# Patient Record
Sex: Female | Born: 1950 | Race: White | Hispanic: No | Marital: Married | Smoking: Never smoker
Health system: Southern US, Community
[De-identification: ages and names within clinical notes are randomized; demographics above are authoritative.]

## PROBLEM LIST (undated history)

## (undated) DIAGNOSIS — F329 Major depressive disorder, single episode, unspecified: Secondary | ICD-10-CM

## (undated) DIAGNOSIS — M349 Systemic sclerosis, unspecified: Secondary | ICD-10-CM

## (undated) DIAGNOSIS — F32A Depression, unspecified: Secondary | ICD-10-CM

## (undated) DIAGNOSIS — Z923 Personal history of irradiation: Secondary | ICD-10-CM

## (undated) DIAGNOSIS — M8580 Other specified disorders of bone density and structure, unspecified site: Secondary | ICD-10-CM

## (undated) DIAGNOSIS — N390 Urinary tract infection, site not specified: Secondary | ICD-10-CM

## (undated) DIAGNOSIS — E039 Hypothyroidism, unspecified: Secondary | ICD-10-CM

## (undated) DIAGNOSIS — I10 Essential (primary) hypertension: Secondary | ICD-10-CM

## (undated) DIAGNOSIS — K635 Polyp of colon: Secondary | ICD-10-CM

## (undated) DIAGNOSIS — M24549 Contracture, unspecified hand: Secondary | ICD-10-CM

## (undated) DIAGNOSIS — I7300 Raynaud's syndrome without gangrene: Secondary | ICD-10-CM

## (undated) DIAGNOSIS — R21 Rash and other nonspecific skin eruption: Secondary | ICD-10-CM

## (undated) DIAGNOSIS — N9489 Other specified conditions associated with female genital organs and menstrual cycle: Secondary | ICD-10-CM

## (undated) DIAGNOSIS — R768 Other specified abnormal immunological findings in serum: Secondary | ICD-10-CM

## (undated) DIAGNOSIS — R234 Changes in skin texture: Secondary | ICD-10-CM

## (undated) DIAGNOSIS — K219 Gastro-esophageal reflux disease without esophagitis: Secondary | ICD-10-CM

## (undated) DIAGNOSIS — M1990 Unspecified osteoarthritis, unspecified site: Secondary | ICD-10-CM

## (undated) DIAGNOSIS — K222 Esophageal obstruction: Secondary | ICD-10-CM

## (undated) DIAGNOSIS — C50419 Malignant neoplasm of upper-outer quadrant of unspecified female breast: Principal | ICD-10-CM

## (undated) DIAGNOSIS — K449 Diaphragmatic hernia without obstruction or gangrene: Secondary | ICD-10-CM

## (undated) DIAGNOSIS — D649 Anemia, unspecified: Secondary | ICD-10-CM

## (undated) DIAGNOSIS — K648 Other hemorrhoids: Secondary | ICD-10-CM

## (undated) DIAGNOSIS — E785 Hyperlipidemia, unspecified: Secondary | ICD-10-CM

## (undated) HISTORY — PX: WISDOM TOOTH EXTRACTION: SHX21

## (undated) HISTORY — DX: Esophageal obstruction: K22.2

## (undated) HISTORY — DX: Gastro-esophageal reflux disease without esophagitis: K21.9

## (undated) HISTORY — PX: ECTOPIC PREGNANCY SURGERY: SHX613

## (undated) HISTORY — DX: Unspecified osteoarthritis, unspecified site: M19.90

## (undated) HISTORY — DX: Other hemorrhoids: K64.8

## (undated) HISTORY — DX: Other specified abnormal immunological findings in serum: R76.8

## (undated) HISTORY — DX: Malignant neoplasm of upper-outer quadrant of unspecified female breast: C50.419

## (undated) HISTORY — DX: Hyperlipidemia, unspecified: E78.5

## (undated) HISTORY — DX: Polyp of colon: K63.5

## (undated) HISTORY — DX: Diaphragmatic hernia without obstruction or gangrene: K44.9

## (undated) HISTORY — PX: PARTIAL MASTECTOMY WITH AXILLARY SENTINEL LYMPH NODE BIOPSY: SHX6004

## (undated) HISTORY — PX: TRANSTHORACIC ECHOCARDIOGRAM: SHX275

## (undated) HISTORY — DX: Essential (primary) hypertension: I10

## (undated) HISTORY — DX: Urinary tract infection, site not specified: N39.0

## (undated) HISTORY — DX: Major depressive disorder, single episode, unspecified: F32.9

## (undated) HISTORY — DX: Depression, unspecified: F32.A

## (undated) HISTORY — DX: Anemia, unspecified: D64.9

---

## 1998-07-15 HISTORY — PX: BUNIONECTOMY: SHX129

## 2011-08-29 DIAGNOSIS — C50419 Malignant neoplasm of upper-outer quadrant of unspecified female breast: Secondary | ICD-10-CM

## 2011-08-29 HISTORY — DX: Malignant neoplasm of upper-outer quadrant of unspecified female breast: C50.419

## 2011-08-30 ENCOUNTER — Other Ambulatory Visit: Payer: Self-pay | Admitting: Radiology

## 2011-08-30 DIAGNOSIS — C50912 Malignant neoplasm of unspecified site of left female breast: Secondary | ICD-10-CM

## 2011-09-03 ENCOUNTER — Ambulatory Visit
Admission: RE | Admit: 2011-09-03 | Discharge: 2011-09-03 | Disposition: A | Discharge status: Home / Self Care | Payer: PRIVATE HEALTH INSURANCE | Source: Ambulatory Visit | Attending: Radiology | Admitting: Radiology

## 2011-09-03 DIAGNOSIS — C50912 Malignant neoplasm of unspecified site of left female breast: Secondary | ICD-10-CM

## 2011-09-03 MED ORDER — GADOBENATE DIMEGLUMINE 529 MG/ML IV SOLN
13.0000 mL | Freq: Once | INTRAVENOUS | Status: AC | PRN
  Administered 2011-09-03: 13 mL via INTRAVENOUS

## 2011-09-13 ENCOUNTER — Telehealth: Payer: Self-pay | Admitting: *Deleted

## 2011-09-13 ENCOUNTER — Encounter (INDEPENDENT_AMBULATORY_CARE_PROVIDER_SITE_OTHER): Payer: Self-pay | Admitting: Surgery

## 2011-09-13 ENCOUNTER — Ambulatory Visit (INDEPENDENT_AMBULATORY_CARE_PROVIDER_SITE_OTHER): Payer: No Typology Code available for payment source | Admitting: Surgery

## 2011-09-13 VITALS — BP 152/104 | HR 76 | Temp 97.4°F | Resp 16 | Ht 65.0 in | Wt 144.0 lb

## 2011-09-13 DIAGNOSIS — C50419 Malignant neoplasm of upper-outer quadrant of unspecified female breast: Secondary | ICD-10-CM

## 2011-09-13 NOTE — Telephone Encounter (Signed)
LMOVM for pt to return my call.  

## 2011-09-13 NOTE — Progress Notes (Signed)
Patient ID: [name], female   DOB: 06-25-1951, 61 y.o.   MRN: [id number]  Chief Complaint  Patient presents with  . Breast Cancer    new pt- eval L breast   Referred by: Dominga Ferry, MD PCP: Elijio Miles, MD, MD   HPI [name] is a 61 y.o. female.  She recently had a mammogram done an abnormality was found in the left breast towards the axillary tail. 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 An MRI has shown no other lesions.  The patient is not having any current breast symptoms. She has a history of breast cysts having been aspirated in the past but none for several years. She has no family history of breast cancer. She does have a maternal aunt who developed ovarian cancer in her 20s. HPI  Past Medical History  Diagnosis Date  . Cancer     L breast  . Hyperlipidemia   . Thyroid disease     hypothyroidism  . Breastr cancer, IDC, Left UOQ, clinical stage II Receptor +, Her 2 - 08/29/2011    Past Surgical History  Procedure Date  . Ectopic pregnancy surgery 1986-88  . Bunionectomy 2000    Left    Family History  Problem Relation Age of Onset  . Heart disease Mother   . Heart disease Father   . Cancer Maternal Aunt     ovarian  . Heart disease Paternal Uncle     Social History History  Substance Use Topics  . Smoking status: Never Smoker   . Smokeless tobacco: Not on file  . Alcohol Use: Yes    Allergies  Allergen Reactions  . Epinephrine     Sever headaches  . Iodine Rash  . Nickel Rash    Rash and blisters  . Penicillins Rash    Rash and blisters  . Sulfa Drugs Cross Reactors Rash    Current Outpatient Prescriptions  Medication Sig Dispense Refill  . aspirin 81 MG tablet Take 81 mg by mouth every other day.      [name] atorvastatin (LIPITOR) 10 MG tablet Take 10 mg by mouth daily.      . B Complex-C (SUPER B COMPLEX PO) Take by mouth.      [name] CALCIUM PO Take 700 mg by mouth daily.      .  Cholecalciferol (VITAMIN D-3 PO) Take 2,000 Units by mouth daily.      . Coenzyme Q10 (COQ10) 100 MG CAPS Take by mouth.      . levothyroxine (SYNTHROID, LEVOTHROID) 75 MCG tablet Take 75 mcg by mouth every other day.      . levothyroxine (SYNTHROID, LEVOTHROID) 88 MCG tablet Take 88 mcg by mouth every other day.      . Magnesium 400 MG CAPS Take by mouth daily.      . mometasone (NASONEX) 50 MCG/ACT nasal spray Place 2 sprays into the nose daily.      . Multiple Vitamin (MULTIVITAMIN) capsule Take 1 capsule by mouth daily.      . NON FORMULARY Mangosteen 3 oz QD      . vitamin E (VITAMIN E) 400 UNIT capsule Take 400 Units by mouth daily.        Review of Systems Review of Systems  Constitutional: Negative for fever, chills and unexpected weight change.  HENT: Negative for hearing loss, congestion, sore throat, trouble swallowing and voice change.   Eyes: Negative for visual disturbance.  Respiratory: Negative  for cough and wheezing.   Cardiovascular: Negative for chest pain, palpitations and leg swelling.  Gastrointestinal: Negative for nausea, vomiting, abdominal pain, diarrhea, constipation, blood in stool, abdominal distention and anal bleeding.  Genitourinary: Negative for hematuria, vaginal bleeding and difficulty urinating.  Musculoskeletal: Negative for arthralgias.  Skin: Negative for rash and wound.  Neurological: Negative for seizures, syncope and headaches.  Hematological: Negative for adenopathy. Does not bruise/bleed easily.  Psychiatric/Behavioral: Negative for confusion.    Blood pressure 152/104, pulse 76, temperature 97.4 F (36.3 C), temperature source Temporal, resp. rate 16, height 5\' 5"  (1.651 m), weight 144 lb (65.318 kg).  Physical Exam Physical Exam  Vitals reviewed. Constitutional: She is oriented to person, place, and time. She appears well-developed and well-nourished. No distress.  HENT:  Head: Normocephalic and atraumatic.  Mouth/Throat: Oropharynx is  clear and moist.  Eyes: Conjunctivae and EOM are normal. Pupils are equal, round, and reactive to light. No scleral icterus.  Neck: Normal range of motion. Neck supple. No tracheal deviation present. No thyromegaly present.  Cardiovascular: Normal rate, regular rhythm, normal heart sounds and intact distal pulses.  Exam reveals no gallop and no friction rub.   No murmur heard. Pulmonary/Chest: Effort normal and breath sounds normal. No respiratory distress. She has no wheezes. She has no rales.         3 cm hard mass in axillary tail. Mobile, but fairly close to skin.     Abdominal: Soft. Bowel sounds are normal. She exhibits no distension and no mass. There is no tenderness. There is no rebound and no guarding.  Musculoskeletal: Normal range of motion. She exhibits no edema and no tenderness.  Neurological: She is alert and oriented to person, place, and time.  Skin: Skin is warm and dry. No rash noted. She is not diaphoretic. No erythema.  Psychiatric: She has a normal mood and affect. Her behavior is normal. Judgment and thought content normal.  Lymphatics: No axillary or supraclavicular adenopathy is noted  Data Reviewed Mammoggrams and MRI reviewed with the radiologist; path reviewed with pathologist  Assessment    Clinical Stage II IDC, UOQ, Left, receptor +    Plan    I think she is a candidate for lumpectomy and SLN. The lumpectomy would be easier if the tumor were smaller with neoadjuvant chemo or hormonal. However, I think we can do it at the present size if there is a potential for no chemo.  I have explained the pathophysiology and staging of breast cancer with particular attention to her exact situation. We discussed the multidisciplinary approach to breast cancer which often includes both medical and radiation oncology consultations.  We also discussed surgical options for the treatment of breast cancer including lumpectomy and mastectomy with possible reconstructive  surgery. In addition we talked about the evaluation and management of lymph nodes including a description of sentinel lymph node biopsy and axillary dissections. We reviewed potential complications and risks including bleeding, infection, numbness,  lymphedema, and the potential need for additional surgery.  She understands that for patients who are candidate for lumpectomy or mastectomy there is an equal survival rate with either technique, but a slightly higher local recurrence rate with lumpectomy. In addition she knows that a lumpectomy usually requires postoperative radiation as part of the management of the breast cancer.  We have discussed the likely postoperative course and plans for followup.  I have given the patient some written information that reviewed all of these issues. I believe her questions are answered  and that she has a good understanding of the issues. I spend thirty minutes in counseling        [place] Alexa J 09/13/2011, 9:28 AM

## 2011-09-13 NOTE — Patient Instructions (Signed)
We will schedule surgery after we know when your appointment with Dr Darnelle Catalan is

## 2011-09-16 ENCOUNTER — Telehealth: Payer: Self-pay | Admitting: *Deleted

## 2011-09-16 NOTE — Telephone Encounter (Signed)
Confirmed 10/02/11 appt w/ pt.  Mailed before appt letter to pt.

## 2011-09-20 ENCOUNTER — Telehealth (INDEPENDENT_AMBULATORY_CARE_PROVIDER_SITE_OTHER): Payer: Self-pay

## 2011-09-20 NOTE — Telephone Encounter (Signed)
Pt calling in to notify Lesly Rubenstein she has an appt with Dr Darnelle Catalan on 10-02-11 and she is wondering what Dr Jamey Ripa was doing about scheduling her surgery. Pt didn't know if she had to wait to schedule after she  See's Dr Darnelle Catalan on 10-02-11. Please advise.

## 2011-09-23 NOTE — Telephone Encounter (Signed)
Left message for patient to call me back. He would like her to see the oncologist prior to scheduling surgery.

## 2011-09-24 NOTE — Telephone Encounter (Signed)
Called patient back, made her aware she does need to see oncology first. We will follow up with her after.

## 2011-10-01 ENCOUNTER — Other Ambulatory Visit: Payer: Self-pay | Admitting: *Deleted

## 2011-10-01 DIAGNOSIS — C50419 Malignant neoplasm of upper-outer quadrant of unspecified female breast: Secondary | ICD-10-CM

## 2011-10-02 ENCOUNTER — Ambulatory Visit: Payer: Self-pay

## 2011-10-02 ENCOUNTER — Other Ambulatory Visit (HOSPITAL_BASED_OUTPATIENT_CLINIC_OR_DEPARTMENT_OTHER): Payer: No Typology Code available for payment source | Admitting: Lab

## 2011-10-02 ENCOUNTER — Encounter: Payer: Self-pay | Admitting: Oncology

## 2011-10-02 ENCOUNTER — Ambulatory Visit (HOSPITAL_BASED_OUTPATIENT_CLINIC_OR_DEPARTMENT_OTHER): Payer: No Typology Code available for payment source | Admitting: Oncology

## 2011-10-02 VITALS — BP 155/83 | HR 80 | Temp 98.4°F | Ht 65.0 in | Wt 144.1 lb

## 2011-10-02 DIAGNOSIS — Z170 Estrogen receptor positive status [ER+]: Secondary | ICD-10-CM

## 2011-10-02 DIAGNOSIS — C50419 Malignant neoplasm of upper-outer quadrant of unspecified female breast: Secondary | ICD-10-CM

## 2011-10-02 DIAGNOSIS — C50919 Malignant neoplasm of unspecified site of unspecified female breast: Secondary | ICD-10-CM

## 2011-10-02 LAB — COMPREHENSIVE METABOLIC PANEL
AST: 21 U/L (ref 0–37)
Albumin: 4.5 g/dL (ref 3.5–5.2)
Alkaline Phosphatase: 70 U/L (ref 39–117)
BUN: 16 mg/dL (ref 6–23)
Calcium: 9.8 mg/dL (ref 8.4–10.5)
Chloride: 100 mEq/L (ref 96–112)
Glucose, Bld: 90 mg/dL (ref 70–99)
Potassium: 3.8 mEq/L (ref 3.5–5.3)
Sodium: 135 mEq/L (ref 135–145)
Total Protein: 7.6 g/dL (ref 6.0–8.3)

## 2011-10-02 LAB — CBC WITH DIFFERENTIAL/PLATELET
Basophils Absolute: 0 10*3/uL (ref 0.0–0.1)
Eosinophils Absolute: 0.1 10*3/uL (ref 0.0–0.5)
MONO#: 0.5 10*3/uL (ref 0.1–0.9)
RBC: 4.41 10*6/uL (ref 3.70–5.45)
RDW: 14 % (ref 11.2–14.5)
lymph#: 1.6 10*3/uL (ref 0.9–3.3)

## 2011-10-02 LAB — CANCER ANTIGEN 27.29: CA 27.29: 15 U/mL (ref 0–39)

## 2011-10-02 NOTE — Progress Notes (Signed)
Patient came in today as a new patient,they have [employer] insurance,I did offer her financial assistance and she said she will decline right now.

## 2011-10-02 NOTE — Progress Notes (Signed)
ID: [name]   DOB: [birth year]  MR#: [id number]  WUJ#:[id number]  HISTORY OF PRESENT ILLNESS: The patient had routine screening mammography at [facility] 12[facility] 2013, suggesting a potential abnormality in the left breast. Additional views 08/29/2011 confirmed an irregular mass with architectural or distortion measuring up to 4 cm in the left breast, without associated microcalcifications. By ultrasound this was irregular, and showed posterior shadowing. It measured 3.6 cm sonographically. There were no suspicious lymph nodes noted in the left axilla.  The same day the patient underwent ultrasound-guided biopsy of the left breast mass, showing ([id number]) and invasive ductal carcinoma, grade 1, which was 100% estrogen receptor and 97% progesterone receptor positive. The proliferation marker was 20%. There was no HER-2 amplification with a ratio by CISH of 1.09.  The patient has met with Dr. Jamey Ripa and is seen today at to discuss whether she should proceed directly to surgery or do of neoadjuvant treatment first  REVIEW OF SYSTEMS: She goes to [place] 2 or 3 times a week. She tells me she is "type triple-A" and what worries a lot about things". This is important regarding her decision today. She feels under of more stress than usual now, of course, and is sleeping with the help of Benadryl. Otherwise a detailed review of systems was entirely noncontributory and in particular I there have been no symptoms to suggest metastatic disease.  PAST MEDICAL HISTORY: Past Medical History  Diagnosis Date  . Cancer     L breast  . Hyperlipidemia   . Thyroid disease     hypothyroidism  . Breastr cancer, IDC, Left UOQ, clinical stage II Receptor +, Her 2 - 08/29/2011  mild osteoarthritis  PAST SURGICAL HISTORY: Past Surgical History  Procedure Date  . Ectopic pregnancy surgery 1986-88  . Bunionectomy 2000    Left  s/p unilateral salpingectomy  FAMILY HISTORY Family History  Problem  Relation Age of Onset  . Heart disease Mother   . Heart disease Father   . Cancer Maternal Aunt     ovarian  . Heart disease Paternal Uncle    the patient's father died at the age of 76 from myocardial infarction. The patient's mother died from congestive heart failure at the age of 63. The patient has one brother age 58 prostate cancer diagnosed at age 25. The patient's sister 56. There is no breast or ovarian cancer in the family  GYNECOLOGIC HISTORY: She had menarche at around age 23. She is GX P1, first pregnancy to term age 61. She stopped having periods about 10 years ago. 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. 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 Her husband [name] and has had prostate cancer, status post radiation treatments in [place] about 2 years ago. Her daughter [name] lives in [place]. She has 2 children. 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 The patient attends the [employer].   ADVANCED DIRECTIVES:  HEALTH MAINTENANCE: History  Substance Use Topics  . Smoking status: Never Smoker   . Smokeless tobacco: Not on file  . Alcohol Use: Yes     Colonoscopy: repeat due 2015  PAP: UTD ([facility])  Bone density: remote  Lipid panel: under treatment  Allergies  Allergen Reactions  . Epinephrine     Sever headaches  . Iodine Rash  .  Nickel Rash    Rash and blisters  . Penicillins Rash    Rash and blisters  . Sulfa Drugs Cross Reactors Rash    Current Outpatient Prescriptions  Medication Sig Dispense Refill  . atorvastatin (LIPITOR) 10 MG tablet Take 10 mg by mouth daily.      . B Complex-C (SUPER B COMPLEX PO) Take by mouth.      [name] CALCIUM PO Take 700 mg by mouth daily.      . Cholecalciferol (VITAMIN D-3 PO) Take 2,000 Units by mouth daily.      . Coenzyme Q10 (COQ10)  100 MG CAPS Take by mouth.      . levothyroxine (SYNTHROID, LEVOTHROID) 75 MCG tablet Take 75 mcg by mouth every other day.      . levothyroxine (SYNTHROID, LEVOTHROID) 88 MCG tablet Take 88 mcg by mouth every other day.      . Magnesium 400 MG CAPS Take by mouth daily.      . mometasone (NASONEX) 50 MCG/ACT nasal spray Place 2 sprays into the nose daily.      . Multiple Vitamin (MULTIVITAMIN) capsule Take 1 capsule by mouth daily.      . NON FORMULARY Mangosteen 3 oz QD      . vitamin E (VITAMIN E) 400 UNIT capsule Take 400 Units by mouth daily.      [name] aspirin 81 MG tablet Take 81 mg by mouth every other day.        OBJECTIVE: Middle-aged white woman who appears fit Filed Vitals:   10/02/11 1620  BP: 155/83  Pulse: 80  Temp: 98.4 F (36.9 C)     Body mass index is 23.98 kg/(m^2).    ECOG FS: 0  Sclerae unicteric Oropharynx clear No peripheral adenopathy and specifically no palpable left axillary adenopathy Lungs no rales or rhonchi Heart regular rate and rhythm Abd benign MSK no focal spinal tenderness, no peripheral edema Neuro: nonfocal Breasts: The right breast is unremarkable. The mass in the left breast is most easily palpable when approach laterally and inferiorly and palpating upwards. It measures approximately 3 cm by palpation. There is no skin change or nipple retraction node  LAB RESULTS: Lab Results  Component Value Date   WBC 6.9 10/02/2011   NEUTROABS 4.7 10/02/2011   HGB 13.6 10/02/2011   HCT 39.7 10/02/2011   MCV 89.9 10/02/2011   PLT 215 10/02/2011      Chemistry      Component Value Date/Time   NA 135 10/02/2011 1553   K 3.8 10/02/2011 1553   CL 100 10/02/2011 1553   CO2 27 10/02/2011 1553   BUN 16 10/02/2011 1553   CREATININE 1.02 10/02/2011 1553      Component Value Date/Time   CALCIUM 9.8 10/02/2011 1553   ALKPHOS 70 10/02/2011 1553   AST 21 10/02/2011 1553   ALT 14 10/02/2011 1553   BILITOT 0.6 10/02/2011 1553       No results found for this  basename: LABCA2    No components found with this basename: LABCA125    No results found for this basename: INR:1;PROTIME:1 in the last 168 hours  Urinalysis No results found for this basename: colorurine, appearanceur, labspec, phurine, glucoseu, hgbur, bilirubinur, ketonesur, proteinur, urobilinogen, nitrite, leukocytesur    STUDIES: Mr Breast Bilateral W Wo Contrast  09/03/2011  *RADIOLOGY REPORT*  Clinical Data: New diagnosis left sided breast cancer.  BUN and creatinine were obtained on site at [facility] at [address] Results:  BUN 10 mg/dL,  Creatinine 1.2 mg/dL.  BILATERAL BREAST MRI WITH AND WITHOUT CONTRAST  Technique: Multiplanar, multisequence MR images of both breasts were obtained prior to and following the intravenous administration of 13ml of Multihance.  Three dimensional images were evaluated at the independent DynaCad workstation.  Comparison:  Mammogram most recently dated 08/27/2011  Findings: Foci of nonspecific enhancement seen bilaterally.  An irregular, enhancing mass with irregular margins is imaged in the upper-outer quadrant of the left breast, posteriorly measuring 3.6 x 3.6 x 2.8 cm corresponding to the known malignancy.  Edema and enhancement is seen extending toward the pectoralis muscle although, no enhancement is identified in the pectoralis muscle.  A biopsy clip is seen in association with the mass.  There is no internal mammary or axillary adenopathy.  No mass or suspicious enhancement is seen in the right breast.  IMPRESSION: Known malignancy, left breast as detailed above.  No MRI specific evidence of malignancy, right breast.  No adenopathy.  THREE-DIMENSIONAL MR IMAGE RENDERING ON INDEPENDENT WORKSTATION:  Three-dimensional MR images were rendered by post-processing of the original MR data on an independent workstation.  The three- dimensional MR images were interpreted, and findings were reported in the accompanying complete MRI report for  this study.  BI-RADS CATEGORY 6:  Known biopsy-proven malignancy - appropriate action should be taken.  Original Report Authenticated By: Hiram Gash, M.D.    ASSESSMENT: 61 year old [place] woman status post left breast biopsy 08/29/2011 for a clinical T2 N0 invasive ductal carcinoma, grade 1, strongly estrogen and progesterone receptor positive, HER-2 negative, with an MIB-1-1 of 20%.  PLAN: We spent the better part of her hour-long visit today going over the biology of her tumor and trying to decide the question of chemotherapy. He she had 1 to 3 positive lymph nodes, her risk of recurrence within 10 years would still be in the 38% range, and this would decrease by 18% if she took anti-estrogens, leaving her a residual risk of 20%. The predicted benefit from chemotherapy in this case would be 5%. Of course of the benefit would be much less, since the absolute risk would be much less, if she proved to be node negative, as I hope she will.  The important point is that even for a benefit of 5%.I would not want chemotherapy. Accordingly we are not sending an Oncotype DX at this point. We then discussed the difference between neoadjuvant and adjuvant therapy, which in her case would be endocrine therapy. She is very uncomfortable with the idea of waiting several months before having a definitive surgical procedure. Accordingly she feels for her it is best not to start with surgery, even though she understands from a cosmetic point of view of the result might not be as optimal as if we were able to shrink the tumor prior to removing it.  I have contacted Dr. Jamey Ripa with her decision. I am also making her an appointment sometime in the next few weeks with Dr. Michell Heinrich so she can complete her treatment team. Chiyoko had many questions regarding radiation to the heart from a left-sided tumor and I reassured her as best I could regarding that today but she will want to continue that discussion with Dr.  Michell Heinrich when they do meet.  I will see her again in early May. She should have had her surgery by then and we will have the definitive pathologic results. I expect we will be able to confirm her decision to forego chemotherapy, in which case  of course she would proceed directly to radiation after that visit. Sher Shampine C    10/02/2011

## 2011-10-03 ENCOUNTER — Telehealth (INDEPENDENT_AMBULATORY_CARE_PROVIDER_SITE_OTHER): Payer: Self-pay | Admitting: General Surgery

## 2011-10-03 ENCOUNTER — Encounter: Payer: Self-pay | Admitting: *Deleted

## 2011-10-03 ENCOUNTER — Telehealth: Payer: Self-pay | Admitting: Oncology

## 2011-10-03 NOTE — Telephone Encounter (Signed)
Patient would like to proceed with surgery for left lumpectomy with sentinel node. Saw Dr Darnelle Catalan yesterday. Please put in orders. Needs the week on 10/22/11-10/25/11.

## 2011-10-03 NOTE — Progress Notes (Signed)
Ordered Oncotype Dx test w/ [facility].  Faxed request to [facility]. 

## 2011-10-03 NOTE — Telephone Encounter (Signed)
S/w the pt and she is aware of the rad onc appt in April with dr Michell Heinrich and her md appts in may

## 2011-10-04 ENCOUNTER — Other Ambulatory Visit (INDEPENDENT_AMBULATORY_CARE_PROVIDER_SITE_OTHER): Payer: Self-pay | Admitting: Surgery

## 2011-10-04 ENCOUNTER — Telehealth (INDEPENDENT_AMBULATORY_CARE_PROVIDER_SITE_OTHER): Payer: Self-pay | Admitting: Surgery

## 2011-10-04 DIAGNOSIS — C50419 Malignant neoplasm of upper-outer quadrant of unspecified female breast: Secondary | ICD-10-CM

## 2011-10-04 NOTE — Telephone Encounter (Signed)
Per Dr Darnelle Catalan she want to have surgery first and we have discussed lumpectomy and sentinel node. Have called her and left message, but am going to put orders etc into Epic so we can be working on a time and date

## 2011-10-04 NOTE — Telephone Encounter (Signed)
Dr Jamey Ripa wrote orders and sent to scheduling.

## 2011-10-07 ENCOUNTER — Encounter: Payer: Self-pay | Admitting: *Deleted

## 2011-10-07 ENCOUNTER — Telehealth (INDEPENDENT_AMBULATORY_CARE_PROVIDER_SITE_OTHER): Payer: Self-pay | Admitting: General Surgery

## 2011-10-07 NOTE — Progress Notes (Signed)
Mailed after appt letter to pt. 

## 2011-10-07 NOTE — Telephone Encounter (Signed)
Wants to know about preop testing.

## 2011-10-07 NOTE — Telephone Encounter (Signed)
Made aware preop testing would happen through hospital a few days prior to surgery. Let her know that would be labs, chest xray and ekg normally. She will call back if she has any other questions.

## 2011-10-08 ENCOUNTER — Other Ambulatory Visit: Payer: Self-pay | Admitting: Oncology

## 2011-10-08 ENCOUNTER — Encounter: Payer: Self-pay | Admitting: *Deleted

## 2011-10-08 NOTE — Progress Notes (Signed)
[name] informed me that the patient has requested to place her Oncotype Dx test on hold until after her surgery to see if she really needs it.  At that time, the patient will let us know if we are to re-order.  I called Efrain at [facility] and he has cancelled the test.

## 2011-10-22 ENCOUNTER — Encounter: Payer: Self-pay | Admitting: Radiation Oncology

## 2011-10-22 NOTE — Progress Notes (Signed)
TO BE SEEN FOR LEFT BREAST MASS, INVASIVE DUCTAL CARCINOMA.  ER / PR POSITIVE  MENARCHE AT AGE 61, SHE IS GX P1, FIRST PREGNANCY TO TERM AGE 43.  STOPPED HAVING PERIODS ABOUT 10 YEARS AGO , NO HORMONE REPLACEMENT  MARRIED  2 CHILDREN

## 2011-10-23 ENCOUNTER — Ambulatory Visit
Admission: RE | Admit: 2011-10-23 | Discharge: 2011-10-23 | Disposition: A | Discharge status: Home / Self Care | Payer: No Typology Code available for payment source | Source: Ambulatory Visit | Attending: Radiation Oncology | Admitting: Radiation Oncology

## 2011-10-23 ENCOUNTER — Encounter: Payer: Self-pay | Admitting: Radiation Oncology

## 2011-10-23 VITALS — BP 138/84 | HR 72 | Temp 97.4°F | Resp 18 | Ht 64.0 in | Wt 144.8 lb

## 2011-10-23 DIAGNOSIS — Z79899 Other long term (current) drug therapy: Secondary | ICD-10-CM | POA: Insufficient documentation

## 2011-10-23 DIAGNOSIS — C50419 Malignant neoplasm of upper-outer quadrant of unspecified female breast: Secondary | ICD-10-CM | POA: Insufficient documentation

## 2011-10-23 DIAGNOSIS — Z170 Estrogen receptor positive status [ER+]: Secondary | ICD-10-CM | POA: Insufficient documentation

## 2011-10-23 DIAGNOSIS — Z7982 Long term (current) use of aspirin: Secondary | ICD-10-CM | POA: Insufficient documentation

## 2011-10-23 DIAGNOSIS — Z510 Encounter for antineoplastic radiation therapy: Secondary | ICD-10-CM | POA: Insufficient documentation

## 2011-10-23 DIAGNOSIS — E785 Hyperlipidemia, unspecified: Secondary | ICD-10-CM | POA: Insufficient documentation

## 2011-10-23 NOTE — Progress Notes (Signed)
[name]         [phone] ________________________________  Initial outpatient Consultation  Name: [name] MRN: [id number]  Date: 10/23/2011  DOB: 08-02-1950  WU:JWJXBJ,YNWG W, MD, MD  [name], Jennifer Hue, MD   REFERRING PHYSICIAN: [name], Jennifer Hue, MD  DIAGNOSIS: The encounter diagnosis was Breastr cancer, IDC, Left UOQ, clinical stage II Receptor +, Her 2 -.  HISTORY OF PRESENT ILLNESS::[name] is a 61 y.o. female who  presents today as referral from Dr. Darnelle [name] or her newly diagnosed left breast cancer. She had routine screening mammography as well as imaging on January 12 which revealed a mass in the left upper outer quadrant. All of the imaging indicated this mass measured about 3 cm. An ultrasound-guided biopsy was performed on 08/29/2011. This showed a negative ductal carcinoma which was ER/PR positive HER-2 negative with a key 67 of 20%. He was evaluated by Dr. Jamey [name] and Dr. Elza [name] at has elected for upfront lumpectomy. An MRI of the bilateral breasts on 09/03/2011 showed a 3.6 x 3.6 x 2.8 cm lesion in the left breast. It was no evidence of disease on the right. No evidence of disease within the rest of the left breast. She is accompanied by her husband today or my opinion regarding [name] in the management of her disease.[name] He states he can now feel this mass. Prior to her biopsy she could not feel this. She had no breast pain. She also note no suspicious lymph nodes were noted on the MRI or mammogram.  PREVIOUS [name] THERAPY: No  PAST MEDICAL HISTORY:  has a past medical history of Cancer; Hyperlipidemia; Thyroid disease; and Breastr cancer, IDC, Left UOQ, clinical stage II Receptor +, Her 2 - (08/29/2011).    PAST SURGICAL HISTORY: Past Surgical History  Procedure Date  . Ectopic pregnancy surgery 1986-88  . Bunionectomy 2000    Left    FAMILY HISTORY: family history includes Cancer in her maternal aunt and Heart disease in her  father, mother, and paternal uncle.  SOCIAL HISTORY:  reports that she has never smoked. She does not have any smokeless tobacco history on file. She reports that she drinks alcohol. She reports that she does not use illicit drugs.  ALLERGIES: Epinephrine; Iodine; Nickel; Penicillins; and Sulfa drugs cross reactors  MEDICATIONS:  Current Outpatient Prescriptions  Medication Sig Dispense Refill  . aspirin 81 MG tablet Take 81 mg by mouth every other day.      [name] atorvastatin (LIPITOR) 10 MG tablet Take 10 mg by mouth daily.      . B Complex-C (SUPER B COMPLEX PO) Take by mouth.      [name] CALCIUM PO Take 700 mg by mouth daily.      . Cholecalciferol (VITAMIN D-3 PO) Take 2,000 Units by mouth daily.      . Coenzyme Q10 (COQ10) 100 MG CAPS Take by mouth.      . levothyroxine (SYNTHROID, LEVOTHROID) 75 MCG tablet Take 75 mcg by mouth every other day.      . levothyroxine (SYNTHROID, LEVOTHROID) 88 MCG tablet Take 88 mcg by mouth every other day.      . Magnesium 400 MG CAPS Take by mouth daily.      . mometasone (NASONEX) 50 MCG/ACT nasal spray Place 2 sprays into the nose daily.      . Multiple Vitamin (MULTIVITAMIN) capsule Take 1 capsule by mouth daily.      . NON FORMULARY Mangosteen 3 oz QD      .  vitamin E (VITAMIN E) 400 UNIT capsule Take 400 Units by mouth daily.        REVIEW OF SYSTEMS:  A 15 point review of systems is documented in the electronic medical record. This was obtained by the nursing staff. However, I reviewed this with the patient to discuss relevant findings and make appropriate changes.  Pertinent items are noted in HPI.   PHYSICAL EXAM:  height is 5\' 4"  (1.626 m) and weight is 144 lb 12.8 oz (65.681 kg). Her oral temperature is 97.4 F (36.3 C). Her blood pressure is 138/84 and her pulse is 72. Her respiration is 18.   She is a pleasant female in no distress sitting comfortably examining table. She has no palpable supraclavicular or cervical adenopathy. She has no  palpable axillary adenopathy bilaterally. She is nothing palpable in her right breast. She has a 3 cm mass palpable in the left axillary tail. This is mobile. She has pendulous breasts bilaterally. She is alert and oriented x3. She has normal gait.  LABORATORY DATA:  Lab Results  Component Value Date   WBC 6.9 10/02/2011   HGB 13.6 10/02/2011   HCT 39.7 10/02/2011   MCV 89.9 10/02/2011   PLT 215 10/02/2011   Lab Results  Component Value Date   NA 135 10/02/2011   K 3.8 10/02/2011   CL 100 10/02/2011   CO2 27 10/02/2011   Lab Results  Component Value Date   ALT 14 10/02/2011   AST 21 10/02/2011   ALKPHOS 70 10/02/2011   BILITOT 0.6 10/02/2011     RADIOGRAPHY: No results found.    IMPRESSION: Newly diagnosed T2 N0 left breast cancer  PLAN: I discussed with [name] the role of [name] in patients who undergo breast conservation. We discussed the randomized trial showing a benefit in terms of local control to patients who undergo [name]. We discussed in the [name] after lumpectomy. We discussed possible risks and benefits of treatment including but not limited to lung and heart damage. We discussed skin erythema and fatigue. We discussed 6 weeks of [name] as well as the process of simulation in the placement tattoos. We discussed the use of breath hold technique to reduce cardiac dose. We discussed that due to her large breast size Dr. Jamey [name] does feel he can approach this with up front lumpectomy. She has discussed neoadjuvant treatment with Dr. Darnelle [name]. At this point she may not need chemotherapy given her low proliferation rate. She will have an Oncotype performed after her surgery. We discussed the [name] would start after chemotherapy if that is ultimately her plan. She has a family vacation scheduled for July. She would like to schedule [name] simulation on the same day as her followup with Dr. Elza [name] at on May 2. Her scheduled followup with her. She  knows that this simulation may be canceled if she does have a high Oncotype score requires chemotherapy. At this point I think she would be fine with breast tangents alone and has no indications rationale for treatment of her draining lymph nodes. I'll plan on seeing her back on May 2. I spent 60 minutes minutes face to face with the patient and more than 50% of that time was spent in counseling and/or coordination of care.   ------------------------------------------------

## 2011-10-23 NOTE — Progress Notes (Signed)
PENDING SURGERY ON November 04, 2011

## 2011-10-28 ENCOUNTER — Encounter (HOSPITAL_BASED_OUTPATIENT_CLINIC_OR_DEPARTMENT_OTHER): Payer: Self-pay | Admitting: *Deleted

## 2011-10-28 NOTE — Progress Notes (Signed)
To come in for labs.ua,cxr

## 2011-10-31 ENCOUNTER — Encounter (HOSPITAL_BASED_OUTPATIENT_CLINIC_OR_DEPARTMENT_OTHER)
Admission: RE | Admit: 2011-10-31 | Discharge: 2011-10-31 | Disposition: A | Discharge status: Home / Self Care | Payer: No Typology Code available for payment source | Source: Ambulatory Visit | Attending: Surgery | Admitting: Surgery

## 2011-10-31 ENCOUNTER — Ambulatory Visit
Admission: RE | Admit: 2011-10-31 | Discharge: 2011-10-31 | Disposition: A | Discharge status: Home / Self Care | Payer: No Typology Code available for payment source | Source: Ambulatory Visit | Attending: Surgery | Admitting: Surgery

## 2011-10-31 LAB — URINALYSIS, ROUTINE W REFLEX MICROSCOPIC
Bilirubin Urine: NEGATIVE
Glucose, UA: NEGATIVE mg/dL
Ketones, ur: NEGATIVE mg/dL
pH: 5.5 (ref 5.0–8.0)

## 2011-10-31 LAB — DIFFERENTIAL
Eosinophils Relative: 2 % (ref 0–5)
Lymphocytes Relative: 27 % (ref 12–46)
Lymphs Abs: 1.4 10*3/uL (ref 0.7–4.0)
Monocytes Absolute: 0.4 10*3/uL (ref 0.1–1.0)

## 2011-10-31 LAB — CBC
MCH: 30.6 pg (ref 26.0–34.0)
Platelets: 235 10*3/uL (ref 150–400)
RBC: 4.71 MIL/uL (ref 3.87–5.11)
WBC: 5.1 10*3/uL (ref 4.0–10.5)

## 2011-10-31 LAB — COMPREHENSIVE METABOLIC PANEL
ALT: 21 U/L (ref 0–35)
AST: 30 U/L (ref 0–37)
CO2: 27 mEq/L (ref 19–32)
Calcium: 10.2 mg/dL (ref 8.4–10.5)
GFR calc non Af Amer: 61 mL/min — ABNORMAL LOW (ref >90)
Sodium: 140 mEq/L (ref 135–145)
Total Protein: 8.1 g/dL (ref 6.0–8.3)

## 2011-10-31 NOTE — Progress Notes (Signed)
Patient in for routine lab draw.  Results reviewed by K. Hyacinth Meeker, RN.   Blood sugar of 50.  Called patient, message left. Second attempt to reach patient at 1540.  Message left.

## 2011-10-31 NOTE — Pre-Procedure Instructions (Addendum)
Pt. called back, states she is fine - had not eaten much this AM prior to coming for labs, but states she feels fine.

## 2011-11-04 ENCOUNTER — Encounter (HOSPITAL_BASED_OUTPATIENT_CLINIC_OR_DEPARTMENT_OTHER): Payer: Self-pay | Admitting: Certified Registered Nurse Anesthetist

## 2011-11-04 ENCOUNTER — Encounter (HOSPITAL_BASED_OUTPATIENT_CLINIC_OR_DEPARTMENT_OTHER)
Admission: RE | Disposition: A | Discharge status: Home / Self Care | Payer: Self-pay | Source: Ambulatory Visit | Attending: Surgery

## 2011-11-04 ENCOUNTER — Ambulatory Visit (HOSPITAL_BASED_OUTPATIENT_CLINIC_OR_DEPARTMENT_OTHER)
Admission: RE | Admit: 2011-11-04 | Discharge: 2011-11-04 | Disposition: A | Discharge status: Home / Self Care | Payer: No Typology Code available for payment source | Source: Ambulatory Visit | Attending: Surgery | Admitting: Surgery

## 2011-11-04 ENCOUNTER — Encounter (HOSPITAL_BASED_OUTPATIENT_CLINIC_OR_DEPARTMENT_OTHER): Payer: Self-pay | Admitting: *Deleted

## 2011-11-04 ENCOUNTER — Ambulatory Visit (HOSPITAL_BASED_OUTPATIENT_CLINIC_OR_DEPARTMENT_OTHER): Payer: No Typology Code available for payment source | Admitting: Certified Registered Nurse Anesthetist

## 2011-11-04 ENCOUNTER — Ambulatory Visit (HOSPITAL_COMMUNITY)
Admission: RE | Admit: 2011-11-04 | Discharge: 2011-11-04 | Disposition: A | Discharge status: Home / Self Care | Payer: No Typology Code available for payment source | Source: Ambulatory Visit | Attending: Surgery | Admitting: Surgery

## 2011-11-04 DIAGNOSIS — Z01812 Encounter for preprocedural laboratory examination: Secondary | ICD-10-CM | POA: Insufficient documentation

## 2011-11-04 DIAGNOSIS — D0590 Unspecified type of carcinoma in situ of unspecified breast: Secondary | ICD-10-CM

## 2011-11-04 DIAGNOSIS — C50419 Malignant neoplasm of upper-outer quadrant of unspecified female breast: Secondary | ICD-10-CM

## 2011-11-04 DIAGNOSIS — E039 Hypothyroidism, unspecified: Secondary | ICD-10-CM | POA: Insufficient documentation

## 2011-11-04 DIAGNOSIS — E785 Hyperlipidemia, unspecified: Secondary | ICD-10-CM | POA: Insufficient documentation

## 2011-11-04 HISTORY — DX: Hypothyroidism, unspecified: E03.9

## 2011-11-04 LAB — GLUCOSE, CAPILLARY: Glucose-Capillary: 90 mg/dL (ref 70–99)

## 2011-11-04 SURGERY — BREAST LUMPECTOMY WITH AXILLARY LYMPH NODE BIOPSY
Anesthesia: General | Site: Breast | Laterality: Left | Wound class: Clean

## 2011-11-04 MED ORDER — MIDAZOLAM HCL 5 MG/5ML IJ SOLN
INTRAMUSCULAR | Status: DC | PRN
  Administered 2011-11-04: 1 mg via INTRAVENOUS

## 2011-11-04 MED ORDER — ONDANSETRON HCL 4 MG/2ML IJ SOLN
INTRAMUSCULAR | Status: DC | PRN
  Administered 2011-11-04: 4 mg via INTRAVENOUS

## 2011-11-04 MED ORDER — MIDAZOLAM HCL 2 MG/2ML IJ SOLN
0.5000 mg | INTRAMUSCULAR | Status: DC | PRN
  Administered 2011-11-04: 2 mg via INTRAVENOUS

## 2011-11-04 MED ORDER — DROPERIDOL 2.5 MG/ML IJ SOLN
INTRAMUSCULAR | Status: DC | PRN
  Administered 2011-11-04: 0.625 mg via INTRAVENOUS

## 2011-11-04 MED ORDER — CHLORHEXIDINE GLUCONATE 4 % EX LIQD: 1.0000 "application " | Freq: Once | CUTANEOUS | Status: DC

## 2011-11-04 MED ORDER — DEXAMETHASONE SODIUM PHOSPHATE 4 MG/ML IJ SOLN
INTRAMUSCULAR | Status: DC | PRN
  Administered 2011-11-04: 10 mg via INTRAVENOUS

## 2011-11-04 MED ORDER — EPHEDRINE SULFATE 50 MG/ML IJ SOLN
INTRAMUSCULAR | Status: DC | PRN
  Administered 2011-11-04: 10 mg via INTRAVENOUS
  Administered 2011-11-04: 5 mg via INTRAVENOUS

## 2011-11-04 MED ORDER — BUPIVACAINE HCL (PF) 0.25 % IJ SOLN
INTRAMUSCULAR | Status: DC | PRN
  Administered 2011-11-04: 30 mL

## 2011-11-04 MED ORDER — FENTANYL CITRATE 0.05 MG/ML IJ SOLN
INTRAMUSCULAR | Status: DC | PRN
  Administered 2011-11-04 (×2): 25 ug via INTRAVENOUS
  Administered 2011-11-04: 50 ug via INTRAVENOUS

## 2011-11-04 MED ORDER — HYDROMORPHONE HCL PF 1 MG/ML IJ SOLN: 0.2500 mg | INTRAMUSCULAR | Status: DC | PRN

## 2011-11-04 MED ORDER — OXYCODONE-ACETAMINOPHEN 5-325 MG PO TABS: 1.0000 | ORAL_TABLET | ORAL | Status: DC | PRN

## 2011-11-04 MED ORDER — LACTATED RINGERS IV SOLN
INTRAVENOUS | Status: DC
  Administered 2011-11-04 (×2): via INTRAVENOUS

## 2011-11-04 MED ORDER — METOCLOPRAMIDE HCL 5 MG/ML IJ SOLN: 10.0000 mg | Freq: Once | INTRAMUSCULAR | Status: DC | PRN

## 2011-11-04 MED ORDER — LIDOCAINE HCL (CARDIAC) 20 MG/ML IV SOLN
INTRAVENOUS | Status: DC | PRN
  Administered 2011-11-04: 60 mg via INTRAVENOUS

## 2011-11-04 MED ORDER — FENTANYL CITRATE 0.05 MG/ML IJ SOLN
50.0000 ug | INTRAMUSCULAR | Status: DC | PRN
  Administered 2011-11-04: 100 ug via INTRAVENOUS

## 2011-11-04 MED ORDER — PROPOFOL 10 MG/ML IV EMUL
INTRAVENOUS | Status: DC | PRN
  Administered 2011-11-04: 200 mg via INTRAVENOUS

## 2011-11-04 MED ORDER — TECHNETIUM TC 99M SULFUR COLLOID FILTERED
1.0000 | Freq: Once | INTRAVENOUS | Status: AC | PRN
  Administered 2011-11-04: 1 via INTRADERMAL

## 2011-11-04 MED ORDER — CIPROFLOXACIN IN D5W 400 MG/200ML IV SOLN
400.0000 mg | INTRAVENOUS | Status: AC
  Administered 2011-11-04: 400 mg via INTRAVENOUS

## 2011-11-04 MED ORDER — ACETAMINOPHEN 10 MG/ML IV SOLN
1000.0000 mg | Freq: Once | INTRAVENOUS | Status: AC
  Administered 2011-11-04: 1000 mg via INTRAVENOUS

## 2011-11-04 MED ORDER — SODIUM CHLORIDE 0.9 % IJ SOLN
INTRAMUSCULAR | Status: DC | PRN
  Administered 2011-11-04: 08:00:00 via INTRAMUSCULAR

## 2011-11-04 SURGICAL SUPPLY — 55 items
APPLIER CLIP 11 MED OPEN (CLIP)
APPLIER CLIP 9.375 MED OPEN (MISCELLANEOUS) ×2
BLADE HEX COATED 2.75 (ELECTRODE) ×2 IMPLANT
BLADE SURG 15 STRL LF DISP TIS (BLADE) ×2 IMPLANT
BLADE SURG 15 STRL SS (BLADE) ×2
CANISTER SUCTION 1200CC (MISCELLANEOUS) ×2 IMPLANT
CHLORAPREP W/TINT 26ML (MISCELLANEOUS) ×2 IMPLANT
CLIP APPLIE 11 MED OPEN (CLIP) IMPLANT
CLIP APPLIE 9.375 MED OPEN (MISCELLANEOUS) ×1 IMPLANT
CLIP TI MEDIUM 6 (CLIP) IMPLANT
CLIP TI WIDE RED SMALL 6 (CLIP) ×2 IMPLANT
CLOTH BEACON ORANGE TIMEOUT ST (SAFETY) ×2 IMPLANT
COVER MAYO STAND STRL (DRAPES) ×2 IMPLANT
COVER PROBE 5X48 (MISCELLANEOUS)
COVER PROBE W GEL 5X96 (DRAPES) ×2 IMPLANT
COVER TABLE BACK 60X90 (DRAPES) ×2 IMPLANT
DECANTER SPIKE VIAL GLASS SM (MISCELLANEOUS) IMPLANT
DERMABOND ADVANCED (GAUZE/BANDAGES/DRESSINGS) ×1
DERMABOND ADVANCED .7 DNX12 (GAUZE/BANDAGES/DRESSINGS) ×1 IMPLANT
DEVICE DUBIN W/COMP PLATE 8390 (MISCELLANEOUS) ×2 IMPLANT
DRAIN CHANNEL 19F RND (DRAIN) IMPLANT
DRAPE LAPAROSCOPIC ABDOMINAL (DRAPES) ×2 IMPLANT
DRAPE UTILITY XL STRL (DRAPES) ×2 IMPLANT
ELECT REM PT RETURN 9FT ADLT (ELECTROSURGICAL) ×2
ELECTRODE REM PT RTRN 9FT ADLT (ELECTROSURGICAL) ×1 IMPLANT
EVACUATOR SILICONE 100CC (DRAIN) IMPLANT
GLOVE ECLIPSE 6.5 STRL STRAW (GLOVE) ×2 IMPLANT
GLOVE EUDERMIC 7 POWDERFREE (GLOVE) ×2 IMPLANT
GOWN PREVENTION PLUS XLARGE (GOWN DISPOSABLE) ×4 IMPLANT
KIT CVR 48X5XPRB PLUP LF (MISCELLANEOUS) IMPLANT
KIT MARKER MARGIN INK (KITS) ×2 IMPLANT
NDL SAFETY ECLIPSE 18X1.5 (NEEDLE) ×1 IMPLANT
NEEDLE HYPO 18GX1.5 SHARP (NEEDLE) ×1
NEEDLE HYPO 25X1 1.5 SAFETY (NEEDLE) ×4 IMPLANT
NS IRRIG 1000ML POUR BTL (IV SOLUTION) ×2 IMPLANT
PACK BASIN DAY SURGERY FS (CUSTOM PROCEDURE TRAY) ×2 IMPLANT
PENCIL BUTTON HOLSTER BLD 10FT (ELECTRODE) ×2 IMPLANT
PIN SAFETY STERILE (MISCELLANEOUS) IMPLANT
SLEEVE SCD COMPRESS KNEE MED (MISCELLANEOUS) ×2 IMPLANT
SPONGE GAUZE 4X4 12PLY (GAUZE/BANDAGES/DRESSINGS) IMPLANT
SPONGE INTESTINAL PEANUT (DISPOSABLE) IMPLANT
SPONGE LAP 18X18 X RAY DECT (DISPOSABLE) IMPLANT
SPONGE LAP 4X18 X RAY DECT (DISPOSABLE) ×2 IMPLANT
STAPLER VISISTAT 35W (STAPLE) ×2 IMPLANT
SUT ETHILON 2 0 FS 18 (SUTURE) IMPLANT
SUT ETHILON 3 0 FSL (SUTURE) IMPLANT
SUT MNCRL AB 4-0 PS2 18 (SUTURE) ×4 IMPLANT
SUT VIC AB 4-0 BRD 54 (SUTURE) IMPLANT
SUT VICRYL 3-0 CR8 SH (SUTURE) ×4 IMPLANT
SYR CONTROL 10ML LL (SYRINGE) ×4 IMPLANT
TOWEL OR 17X24 6PK STRL BLUE (TOWEL DISPOSABLE) ×2 IMPLANT
TOWEL OR NON WOVEN STRL DISP B (DISPOSABLE) ×2 IMPLANT
TUBE CONNECTING 20X1/4 (TUBING) ×2 IMPLANT
WATER STERILE IRR 1000ML POUR (IV SOLUTION) IMPLANT
YANKAUER SUCT BULB TIP NO VENT (SUCTIONS) ×2 IMPLANT

## 2011-11-04 NOTE — Anesthesia Procedure Notes (Signed)
Procedure Name: LMA Insertion Date/Time: 11/04/2011 7:41 AM Performed by: Jake Goodson D Pre-anesthesia Checklist: Patient identified, Emergency Drugs available, Suction available and Patient being monitored Patient Re-evaluated:Patient Re-evaluated prior to inductionOxygen Delivery Method: Circle System Utilized Preoxygenation: Pre-oxygenation with 100% oxygen Intubation Type: IV induction Ventilation: Mask ventilation without difficulty LMA: LMA inserted LMA Size: 4.0 Number of attempts: 1 Placement Confirmation: positive ETCO2 Tube secured with: Tape Dental Injury: Teeth and Oropharynx as per pre-operative assessment

## 2011-11-04 NOTE — Transfer of Care (Signed)
Immediate Anesthesia Transfer of Care Note  Patient: [name]  Procedure(s) Performed: Procedure(s) (LRB): BREAST LUMPECTOMY WITH AXILLARY LYMPH NODE BIOPSY (Left)  Patient Location: PACU  Anesthesia Type: General  Level of Consciousness: sedated  Airway & Oxygen Therapy: Patient Spontanous Breathing and Patient connected to face mask oxygen  Post-op Assessment: Report given to PACU RN and Post -op Vital signs reviewed and stable  Post vital signs: Reviewed and stable  Complications: No apparent anesthesia complications

## 2011-11-04 NOTE — H&P (Signed)
CC: Breast Cancer  HPI: [name] is a 61 y.o. female. She recently had a mammogram done an abnormality was found in the left breast towards the axillary tail. 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 An MRI has shown no other lesions.  The patient is not having any current breast symptoms. She has a history of breast cysts having been aspirated in the past but none for several years. She has no family history of breast cancer. She does have a maternal aunt who developed ovarian cancer in her 50s.On exam the mass is palpable. She had discussions about preoperative neoadjuvant chemotherapy but has elected to proceed to a primary lumpectomy with chemotherapy if needed after final pathology report is available. I reviewed again all of her issues and questions before surgery.  Past medical history: Past Medical History  Diagnosis Date  . Cancer     L breast  . Hyperlipidemia   . Thyroid disease     hypothyroidism  . Breastr cancer, IDC, Left UOQ, clinical stage II Receptor +, Her 2 - 08/29/2011  . Hypothyroidism   . Hyperlipemia    Past surgical history: Past Surgical History  Procedure Date  . Ectopic pregnancy surgery 1986-88  . Bunionectomy 2000    Left  . Wisdom tooth extraction    Allergies: Allergies  Allergen Reactions  . Epinephrine     Sever headaches  . Iodine Rash  . Nickel Rash    Rash and blisters  . Penicillins Rash    Rash and blisters  . Sulfa Drugs Cross Reactors Rash   Medications: Current Facility-Administered Medications  Medication Dose Route Frequency Provider Last Rate Last Dose  . acetaminophen (OFIRMEV) IV 1,000 mg  1,000 mg Intravenous Once Hart Robinsons, MD   1,000 mg at 11/04/11 0700  . chlorhexidine (HIBICLENS) 4 % liquid 1 application  1 application Topical Once Currie [place], MD      . chlorhexidine (HIBICLENS) 4 % liquid 1 application  1 application Topical Once Currie [place],  MD      . ciprofloxacin (CIPRO) IVPB 400 mg  400 mg Intravenous 120 min pre-op Currie [place], MD   400 mg at 11/04/11 0717  . fentaNYL (SUBLIMAZE) injection 50-100 mcg  50-100 mcg Intravenous PRN Hart Robinsons, MD   100 mcg at 11/04/11 0707  . lactated ringers infusion   Intravenous Continuous E. Jairo Ben, MD 10 mL/hr at 11/04/11 0700    . midazolam (VERSED) injection 0.5-2 mg  0.5-2 mg Intravenous PRN Hart Robinsons, MD   2 mg at 11/04/11 0707   Facility-Administered Medications Ordered in Other Encounters  Medication Dose Route Frequency Provider Last Rate Last Dose  . technetium sulfur colloid (NYCOMED-Loda) filtered injection solution 1 milli Curie  1 milli Curie Intradermal Once PRN Medication Radiologist, MD   1 milli Curie at 11/04/11 [phone]   Review of systems: There has been no change since her original note last month.  Exam: Vital signs:BP 135/83  Pulse 73  Temp(Src) 98.2 F (36.8 C) (Oral)  Resp 13  SpO2 100% Constitutional: She is oriented to person, place, and time. She appears well-developed and well-nourished. No distress.  HENT:  Head: Normocephalic and atraumatic.  Mouth/Throat: Oropharynx is clear and moist.  Eyes: Conjunctivae and EOM are normal. Pupils are equal, round, and reactive to light. No scleral icterus.  Neck: Normal range of motion. Neck supple. No tracheal deviation present. No thyromegaly present.  Cardiovascular: Normal rate, regular  rhythm, normal heart sounds and intact distal pulses. Exam reveals no gallop and no friction rub.  No murmur heard.  Pulmonary/Chest: Effort normal and breath sounds normal. No respiratory distress. She has no wheezes. She has no rales.    3 cm hard mass in axillary tail. Mobile, but fairly close to skin.   Abdominal: Soft. Bowel sounds are normal. She exhibits no distension and no mass. There is no tenderness. There is no rebound and no guarding.  Musculoskeletal: Normal range of motion. She exhibits no  edema and no tenderness.  Neurological: She is alert and oriented to person, place, and time.  Skin: Skin is warm and dry. No rash noted. She is not diaphoretic. No erythema.  Psychiatric: She has a normal mood and affect. Her behavior is normal. Judgment and thought content normal.  Lymphatics: No axillary or supraclavicular adenopathy is noted  Data Reviewed  Mammoggrams and MRI reviewed with the radiologist; path reviewed with pathologist  Assessment  Clinical Stage II IDC, UOQ, Left, receptor +  Plan She'll have a left lumpectomy with sentinel evaluation. I've discussed this with the patient this morning as well as her husband. All questions are answered.

## 2011-11-04 NOTE — Op Note (Signed)
CHRISTA FASIG 29-Sep-1950 [id number] 10/07/2011  Preoperative diagnosis: Breast cancer, invasive ductal, left breast upper-outer quadrant, clinical stage II.  Postoperative diagnosis: Same  Procedure: Left partial mastectomy with blue dye injection and axillary sentinel lymph node excision  Surgeon: Currie [place], MD, FACS  Assistant: None  Anesthesia: General   Clinical History and Indications: This patient was recently found to have a breast cancer in the upper-outer quadrant of left breast basically in the axillary tail of Spence. It was 3.5 cm in size. She was offered neoadjuvant chemotherapy or hormonal therapy but declined and wished to proceed to surgery. The plans were reviewed again today and all questions answered. The left breast was initial this the operative side.    Description of Procedure: The patient was taken to the operating room and after satisfactory general anesthesia had been obtained the left breast Was prepped with some alcoholAnd a timeout was done. The breast was then injected with 5 cc of dilute methylene blue which was massaged in.A full prep and drape was done. The neoprobe was used to identify an area for sentinel node and The skin was marked overlying the node. I made a long curvilinear incision centered on the palpable mass. It went a couple centimeters medial and lateral to the palpable mass. Very thin skin flaps were raised up into the axilla medially laterally and inferiorly. Using the cautery I then divided the tissue down to the chest wall medial to the cancer. I then came up towards the axilla and was actually opening the clavipectoral fascia to be sure we had a deep margin. The fascia overlying the pectoralis muscle was taken. I then divided the tissues laterally and posteriorly and then finally Laterally. Along the lateral aspect was an area of tissue that I thought represented dense fibrocystic change but took some extra margin there basis was  actually tumor near the margin. The specimen mammogram showed the clip to be in the specimen.  I hope the clavicle pectoral fascia little bit more and use the neoprobe identified a hot node that measured about 1 cm but was relatively soft. I didn't see any blue dye. A second hot area was found and removed there is a small lymph node here. Additional dissection removed a little bit more fatty tissue that has a counselor I couldn't tell if there was a node present or not. This was sent separately. No other hot areas were found nor any palpable areas of abnormality. Medium-sized clips were used to help with hemostasis during the axillary portion of the procedure.  I then irrigated and made sure everything was dry. I put 30 cc of 0.25% plain Marcaine and to help with postop pain relief. I put baby clips to mark the margins. Another irrigation and check for hemostasis was made and the incision then closed in layers with 3-0 Vicryl, 4-0 Monocryl subcuticular and Dermabond.  The patient tolerated the procedure well. There were no complications. Counts were correct. Blood loss was minimal. Currie [place], MD, FACS 11/04/2011 8:58 AM

## 2011-11-04 NOTE — Discharge Instructions (Addendum)
[facility] °[phone] ° ° °BREAST BIOPSY/ PARTIAL MASTECTOMY: POST OP INSTRUCTIONS ° °Always review your discharge instruction sheet given to you by the facility where your surgery was performed. ° °IF YOU HAVE DISABILITY OR FAMILY LEAVE FORMS, YOU MUST BRING THEM TO THE OFFICE FOR PROCESSING.  DO NOT GIVE THEM TO YOUR DOCTOR. ° °1. A prescription for pain medication will be given to you upon discharge.  Take your pain medication as prescribed, as needed.  If narcotic pain medicine is not needed, then you may take ibuprofen (Advil) as needed. °2. Take your usually prescribed medications unless otherwise directed °3. If you need a refill on your pain medication, please contact your pharmacy.  They will contact our office to request authorization.  Prescriptions will not be filled after 5pm or on week-ends. °4. You should eat very light the first 24 hours after surgery, such as soup, crackers, pudding, etc.  Resume your normal diet the day after surgery. °5. Most patients will experience some swelling and bruising in the breast.  Ice packs and a good support bra will help.  Swelling and bruising can take several days to resolve.  °6. It is common to experience some constipation if taking pain medication after surgery.  Increasing fluid intake and taking a stool softener will usually help or prevent this problem from occurring.  A mild laxative (Milk of Magnesia or Miralax) should be taken according to package directions if there are no bowel movements after 48 hours. °7. Unless discharge instructions indicate otherwise, you may remove your bandages 24 hours after surgery, and you may shower at that time.  If your surgeon used skin glue on the incision, you may shower in 24 hours.  The glue will flake off over the next 2-3 weeks. °8. DRAINS:  If you have drain, it is important to keep a list of the amount of drainage produced each day in your drains.  Before leaving the hospital, you should  be instructed on drain care.  Call our office if you have any questions about your drains. BE SURE TO BRING THE RECORD OF THE AMOUNT OF DRAINAGE TO YOUR OFFICE VISITS. We use this to determine when the drains can be removed. °9. ACTIVITIES:  You may resume regular daily activities (gradually increasing) beginning the next day.  Wearing a good support bra or sports bra minimizes pain and swelling.  You may have sexual intercourse when it is comfortable. °a. You may drive when you no longer are taking prescription pain medication, you can comfortably wear a seatbelt, and you can safely maneuver your car and apply brakes. °b. RETURN TO WORK:  ______________________________________________________________________________________ °10. You should see your doctor in the office for a follow-up appointment approximately two weeks after your surgery.  Your doctor’s nurse will typically make your follow-up appointment when she calls you with your pathology report.  Expect your pathology report 2-3 business days after your surgery.  You may call to check if you do not hear from us after three days. °11. OTHER INSTRUCTIONS: _______________________________________________________________________________________________ _____________________________________________________________________________________________________________________________________ °_____________________________________________________________________________________________________________________________________ °_____________________________________________________________________________________________________________________________________ ° °WHEN TO CALL YOUR DOCTOR: °1. Fever over 101.0 °2. Nausea and/or vomiting. °3. Extreme swelling or bruising. °4. Continued bleeding from incision. °5. Increased pain, redness, or drainage from the incision. ° °The clinic staff is available to answer your questions during regular business hours.  Please don’t  hesitate to call and ask to speak to one of the nurses for clinical concerns.  If you have a medical emergency, go   to the nearest emergency room or call 911.  A surgeon from [facility] is always on call at the hospital. ° °[address], [address], [place], [place]  [address] ?  °[address] [address], [place], [place]   [address] °                          [phone] ? [phone] ? FAX [phone] °Web site: [email or web address] ° ° ° °Post Anesthesia Home Care Instructions ° °Activity: °Get plenty of rest for the remainder of the day. A responsible adult should stay with you for 24 hours following the procedure.  °For the next 24 hours, DO NOT: °-Drive a car °-Operate machinery °-Drink alcoholic beverages °-Take any medication unless instructed by your physician °-Make any legal decisions or sign important papers. ° °Meals: °Start with liquid foods such as gelatin or soup. Progress to regular foods as tolerated. Avoid greasy, spicy, heavy foods. If nausea and/or vomiting occur, drink only clear liquids until the nausea and/or vomiting subsides. Call your physician if vomiting continues. ° °Special Instructions/Symptoms: °Your throat may feel dry or sore from the anesthesia or the breathing tube placed in your throat during surgery. If this causes discomfort, gargle with warm salt water. The discomfort should disappear within 24 hours. ° ° °

## 2011-11-04 NOTE — Anesthesia Postprocedure Evaluation (Signed)
Anesthesia Post Note  Patient: [name]  Procedure(s) Performed: Procedure(s) (LRB): BREAST LUMPECTOMY WITH AXILLARY LYMPH NODE BIOPSY (Left)  Anesthesia type: General  Patient location: PACU  Post pain: Pain level controlled  Post assessment: Patient's Cardiovascular Status Stable  Last Vitals:  Filed Vitals:   11/04/11 0945  BP: 144/81  Pulse: 75  Temp:   Resp: 13    Post vital signs: Reviewed and stable  Level of consciousness: alert  Complications: No apparent anesthesia complications

## 2011-11-04 NOTE — Anesthesia Preprocedure Evaluation (Signed)
Anesthesia Evaluation  Patient identified by MRN, date of birth, ID band Patient awake    Reviewed: Allergy & Precautions, H&P , NPO status , Patient's Chart, lab work & pertinent test results, reviewed documented beta blocker date and time   Airway Mallampati: II TM Distance: >3 FB Neck ROM: full    Dental   Pulmonary neg pulmonary ROS,          Cardiovascular negative cardio ROS      Neuro/Psych negative neurological ROS  negative psych ROS   GI/Hepatic negative GI ROS, Neg liver ROS,   Endo/Other  Hypothyroidism   Renal/GU negative Renal ROS  negative genitourinary   Musculoskeletal   Abdominal   Peds  Hematology negative hematology ROS (+)   Anesthesia Other Findings See surgeon's H&P   Reproductive/Obstetrics negative OB ROS                           Anesthesia Physical Anesthesia Plan  ASA: II  Anesthesia Plan: General   Post-op Pain Management:    Induction: Intravenous  Airway Management Planned: LMA  Additional Equipment:   Intra-op Plan:   Post-operative Plan: Extubation in OR  Informed Consent: I have reviewed the patients History and Physical, chart, labs and discussed the procedure including the risks, benefits and alternatives for the proposed anesthesia with the patient or authorized representative who has indicated his/her understanding and acceptance.     Plan Discussed with: CRNA and Surgeon  Anesthesia Plan Comments:         Anesthesia Quick Evaluation  

## 2011-11-04 NOTE — Progress Notes (Signed)
Emotional support during breast injections °

## 2011-11-05 ENCOUNTER — Telehealth (INDEPENDENT_AMBULATORY_CARE_PROVIDER_SITE_OTHER): Payer: Self-pay | Admitting: General Surgery

## 2011-11-05 ENCOUNTER — Encounter (INDEPENDENT_AMBULATORY_CARE_PROVIDER_SITE_OTHER): Payer: Self-pay | Admitting: Surgery

## 2011-11-05 NOTE — Telephone Encounter (Signed)
Patient aware path results are good. Lymph nodes negative and margins ok. She will follow up in the office at her scheduled appt and call with any questions prior.  

## 2011-11-05 NOTE — Telephone Encounter (Signed)
Message copied by Liliana Cline on Tue Nov 05, 2011  5:17 PM ------      Message from: Currie [place]      Created: Tue Nov 05, 2011  3:17 PM       Tell the patient that her margins are OK and her lymph nodes are negative. I will discuss in detail in the office.

## 2011-11-12 ENCOUNTER — Encounter (INDEPENDENT_AMBULATORY_CARE_PROVIDER_SITE_OTHER): Payer: Self-pay | Admitting: Surgery

## 2011-11-12 ENCOUNTER — Ambulatory Visit (INDEPENDENT_AMBULATORY_CARE_PROVIDER_SITE_OTHER): Payer: No Typology Code available for payment source | Admitting: Surgery

## 2011-11-12 VITALS — BP 140/78 | HR 72 | Temp 97.4°F | Resp 18 | Ht 65.0 in | Wt 143.0 lb

## 2011-11-12 DIAGNOSIS — Z9889 Other specified postprocedural states: Secondary | ICD-10-CM

## 2011-11-12 NOTE — Progress Notes (Signed)
[name]    [id number] 11/12/2011    [birth year]   CC: Post op Lumpectomy and SLN  HPI: The patient returns for post op follow-up. She underwent a Left lumpectomy with a single incision in the upper outer quadrant on 4/22. Over all she feels that she is doing well. She notes mild nipple inversion. Minimal pain  PE: The incision is healing nicely and there is no evidence of infection or hematoma.   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 Nodes negative  IMPRESSION: Patient doing well. Can start radiation  PLAN: Her next visit will be in two months, after radiation. Recommended ABC. Gave her a copy of path and discussed with her.

## 2011-11-12 NOTE — Patient Instructions (Signed)
You may resume normal activities. See me again after radiation, sooner if any problems. Go to the ABC class.      [name]  is OK to attend the ABC Class Currie Paris, MD, [facility], [place] [phone] 11/12/2011 8:50 AM

## 2011-11-14 ENCOUNTER — Ambulatory Visit (HOSPITAL_BASED_OUTPATIENT_CLINIC_OR_DEPARTMENT_OTHER): Payer: No Typology Code available for payment source | Admitting: Oncology

## 2011-11-14 ENCOUNTER — Ambulatory Visit
Admission: RE | Admit: 2011-11-14 | Discharge: 2011-11-14 | Disposition: A | Discharge status: Home / Self Care | Payer: No Typology Code available for payment source | Source: Ambulatory Visit | Attending: Radiation Oncology | Admitting: Radiation Oncology

## 2011-11-14 ENCOUNTER — Telehealth: Payer: Self-pay | Admitting: Oncology

## 2011-11-14 ENCOUNTER — Other Ambulatory Visit: Payer: No Typology Code available for payment source | Admitting: Lab

## 2011-11-14 VITALS — BP 138/83 | HR 80 | Temp 97.5°F | Ht 65.0 in | Wt 145.1 lb

## 2011-11-14 DIAGNOSIS — C50919 Malignant neoplasm of unspecified site of unspecified female breast: Secondary | ICD-10-CM

## 2011-11-14 DIAGNOSIS — Z170 Estrogen receptor positive status [ER+]: Secondary | ICD-10-CM

## 2011-11-14 DIAGNOSIS — C50419 Malignant neoplasm of upper-outer quadrant of unspecified female breast: Secondary | ICD-10-CM

## 2011-11-14 NOTE — Progress Notes (Signed)
[name]    11/14/2011  ID: [name]   DOB: 1950-07-23  MR#: [id number]  CSN#:[id number]  HISTORY OF PRESENT ILLNESS: The patient had routine screening mammography at [facility] the [facility] 12th 2013, suggesting a potential abnormality in the left breast. Additional views 08/29/2011 confirmed an irregular mass with architectural or distortion measuring up to 4 cm in the left breast, without associated microcalcifications. By ultrasound this was irregular, and showed posterior shadowing. It measured 3.6 cm sonographically. There were no suspicious lymph nodes noted in the left axilla.  The same day the patient underwent ultrasound-guided biopsy of the left breast mass, showing ([id number]) and invasive ductal carcinoma, grade 1, which was 100% estrogen receptor and 97% progesterone receptor positive. The proliferation marker was 20%. There was no HER-2 amplification with a ratio by CISH of 1.09.  The patient had bilateral breast MRI 09/03/2011 showing a solitary 3.8 cm left breast lesion, and on 11/04/2011 underwent left lumpectomy and sentinel lymph node sampling with results and subsequent history as detailed below.  INTERVAL HISTORY: [name] returns today with her husband [name] Stable for followup of her breast cancer. Since the last visit here she had her definitive left lumpectomy and sentinel lymph node sampling. She did remarkably well with the surgery, without significant pain, fever, bleeding, dehiscence, or other complications. She is here today to make a definitive decision regarding chemotherapy.  REVIEW OF SYSTEMS:  Sometimes when she stretches her left arm up she notices a little "pull". This is minimal, it is not particularly unpleasant or worrisome. There has been no swelling of the left upper extremity. She had an unpleasant sensation in her mouth and throat shortly after surgery but that has cleared. She still not driving because she does not want to pull the left seatbelt strap across  her left shoulder, but is planning to resume soon. Otherwise a detailed review of systems today was noncontributory.  PAST MEDICAL HISTORY: Past Medical History  Diagnosis Date  . Cancer     L breast  . Hyperlipidemia   . Thyroid disease     hypothyroidism  . Breastr cancer, IDC, Left UOQ, clinical stage II Receptor +, Her 2 - 08/29/2011  . Hypothyroidism   . Hyperlipemia   mild osteoarthritis  PAST SURGICAL HISTORY: Past Surgical History  Procedure Date  . Ectopic pregnancy surgery 1986-88  . Bunionectomy 2000    Left  . Wisdom tooth extraction   . Brain surgery 11/04/11    breast lumpectomy  s/p unilateral salpingectomy  FAMILY HISTORY Family History  Problem Relation Age of Onset  . Heart disease Mother   . Heart disease Father   . Cancer Maternal Aunt     ovarian  . Heart disease Paternal Uncle    the patient's father died at the age of 63 from myocardial infarction. The patient's mother died from congestive heart failure at the age of 63. The patient has one brother age 63 prostate cancer diagnosed at age 81. The patient's sister 63. There is no breast or ovarian cancer in the family  GYNECOLOGIC HISTORY: She had menarche at around age 61. She is GX P1, first pregnancy to term age 61. She stopped having periods about 10 years ago. 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 Her husband [name] Stable  has had prostate cancer, status post  radiation treatments in [place] about 2 years ago. Her daughter [name] lives in [place]. She has 2 children. 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 The patient attends the [employer].   ADVANCED DIRECTIVES:  HEALTH MAINTENANCE: History  Substance Use Topics  . Smoking status: Never Smoker   . Smokeless tobacco: Never Used  . Alcohol Use:  Yes     Colonoscopy: repeat due 2015  PAP: UTD ([facility] Abbott)  Bone density: remote  Lipid panel: under treatment  Allergies  Allergen Reactions  . Oxycodone Anxiety  . Epinephrine     Sever headaches  . Iodine Rash  . Nickel Rash    Rash and blisters  . Penicillins Rash    Rash and blisters  . Sulfa Drugs Cross Reactors Rash    Current Outpatient Prescriptions  Medication Sig Dispense Refill  . atorvastatin (LIPITOR) 10 MG tablet Take 10 mg by mouth daily.      . B Complex-[name] (SUPER B COMPLEX PO) Take by mouth.      [name] CALCIUM PO Take 700 mg by mouth daily.      . Cholecalciferol (VITAMIN D-3 PO) Take 2,000 Units by mouth daily.      . Coenzyme Q10 (COQ10) 100 MG CAPS Take by mouth.      . diphenhydramine-acetaminophen (TYLENOL PM) 25-500 MG TABS Take 1 tablet by mouth at bedtime as needed.      [name] levothyroxine (SYNTHROID, LEVOTHROID) 75 MCG tablet Take 75 mcg by mouth every other day.      . levothyroxine (SYNTHROID, LEVOTHROID) 88 MCG tablet Take 88 mcg by mouth every other day.      . Magnesium 400 MG CAPS Take by mouth daily.      . mometasone (NASONEX) 50 MCG/ACT nasal spray Place 2 sprays into the nose daily.      . Multiple Vitamin (MULTIVITAMIN) capsule Take 1 capsule by mouth daily.      . NON FORMULARY Mangosteen 3 oz QD      . vitamin E (VITAMIN E) 400 UNIT capsule Take 400 Units by mouth daily.      [name] aspirin 81 MG tablet Take 81 mg by mouth every other day.        OBJECTIVE: Middle-aged white woman in no acute distress There were no vitals filed for this visit.   There is no height or weight on file to calculate BMI.    ECOG FS: 1  Sclerae unicteric Oropharynx clear No peripheral adenopathy  Lungs no rales or rhonchi Heart regular rate and rhythm Abd benign MSK no focal spinal tenderness, specifically no peripheral edema LUE Neuro: nonfocal Breasts: The right breast is unremarkable. The left breast is status post lumpectomy. The incision is high  in the outer upper quadrant there is no dehiscence. There is minimal subjacent swelling. There is no erythema or unusual tenderness.  LAB RESULTS: Lab Results  Component Value Date   WBC 5.1 10/31/2011   NEUTROABS 3.3 10/31/2011   HGB 14.4 10/31/2011   HCT 42.3 10/31/2011   MCV 89.8 10/31/2011   PLT 235 10/31/2011      Chemistry      Component Value Date/Time   NA 140 10/31/2011 0913   K 3.4* 10/31/2011 0913   CL 102 10/31/2011 0913   CO2 27 10/31/2011 0913   BUN 14 10/31/2011 0913   CREATININE 0.98 10/31/2011 0913      Component Value Date/Time   CALCIUM 10.2 10/31/2011 0913   ALKPHOS  80 10/31/2011 0913   AST 30 10/31/2011 0913   ALT 21 10/31/2011 0913   BILITOT 0.5 10/31/2011 0913       Lab Results  Component Value Date   LABCA2 15 10/02/2011    No components found with this basename: [id number]    No results found for this basename: INR:1;PROTIME:1 in the last 168 hours  Urinalysis    Component Value Date/Time   COLORURINE YELLOW 10/31/2011 0859    STUDIES: Mr Breast Bilateral W Wo Contrast  09/03/2011  *RADIOLOGY REPORT*  Clinical Data: New diagnosis left sided breast cancer.  BUN and creatinine were obtained on site at [facility] at [address] Results:  BUN 10 mg/dL,  Creatinine 1.2 mg/dL.  BILATERAL BREAST MRI WITH AND WITHOUT CONTRAST  Technique: Multiplanar, multisequence MR images of both breasts were obtained prior to and following the intravenous administration of 13ml of Multihance.  Three dimensional images were evaluated at the independent DynaCad workstation.  Comparison:  Mammogram most recently dated 08/27/2011  Findings: Foci of nonspecific enhancement seen bilaterally.  An irregular, enhancing mass with irregular margins is imaged in the upper-outer quadrant of the left breast, posteriorly measuring 3.6 x 3.6 x 2.8 cm corresponding to the known malignancy.  Edema and enhancement is seen extending toward the pectoralis muscle although, no enhancement  is identified in the pectoralis muscle.  A biopsy clip is seen in association with the mass.  There is no internal mammary or axillary adenopathy.  No mass or suspicious enhancement is seen in the right breast.  IMPRESSION: Known malignancy, left breast as detailed above.  No MRI specific evidence of malignancy, right breast.  No adenopathy.  THREE-DIMENSIONAL MR IMAGE RENDERING ON INDEPENDENT WORKSTATION:  Three-dimensional MR images were rendered by post-processing of the original MR data on an independent workstation.  The three- dimensional MR images were interpreted, and findings were reported in the accompanying complete MRI report for this study.  BI-RADS CATEGORY 6:  Known biopsy-proven malignancy - appropriate action should be taken.  Original Report Authenticated By: Hiram Gash, M.D.    ASSESSMENT: 61 year old [place] woman status post left lumpectomy 11/04/2011 for a T2 N0, stage IIA invasive ductal carcinoma, grade 2, strongly estrogen and progesterone receptor positive, HER-2 negative, with an MIB-1-1 of 20%.  PLAN: We spent the better part of her hour-long visit today going over her prognosis according to the Adjuvant! Program. She understands the difference between recurrence and distant recurrence, the latter being essentially identical to death, since a distant recurrence is not curable with our current knowledge base. The total recurrence rate, including in breast recurrence, would be 43% in women like her, decreasing by 20% with optimal anti-estrogen therapy. This would leave her with a residual risk of recurrence of 23%. If she took aggressive chemotherapy for 4 months this would decrease by 6% leaving her with a 17% risk of recurrence overall.  As far as mortality is concerned, 23/100 women like her would die within 10 years with local treatment only. Taking anti-estrogen therapy would reduce this by 6%, leaving her with an 83% chance of not dying within the next 10 years. If  she took aggressive chemotherapy this would reduce that risk by somewhere between 3 and 4%.  I do not believe an Oncotype (as suggested by NCCN guidelines) would be helpful since it is likely to fall in the intermediate range and in any case she is very clear that she is not interested in chemotherapy for these marginal  benefits. Her husband [name] Stable is in agreement and I am certainly not uncomfortable with her decision. She will continue to think about it and if she wishes to reconsider it  she will let me know, but as of now the plan is for her to proceed directly to radiation and then antiestrogen therapy to follow  In that regard I have set her up for a bone density at the breast center (she would prefer to have all her future mammograms there as well) and we will obtain a vitamin D level also a before she returns to see me mid July. I would like to start her on an aromatase inhibitor at that time. If the bone density is worrisome we will also consider bisphosphonates. Aidian Salomon [name]    11/14/2011

## 2011-11-14 NOTE — Progress Notes (Signed)
Met with patient to discuss RO billing.  Attending Rad: Dr. Michell Heinrich  Dx: 174.4 Upper-outer quadrant of breast  Rad Tx: [id number] Extrl Beam

## 2011-11-14 NOTE — Telephone Encounter (Signed)
gve the pt her July 2013 appt calendar along with the bone density appt 

## 2011-11-14 NOTE — Progress Notes (Signed)
Name: [name]   MRN: [id number]  Date:  11/14/2011  DOB: [birth year]  Status:outpatient    DIAGNOSIS: Breast cancer.  CONSENT VERIFIED: yes   SET UP: Patient is setup supine   IMMOBILIZATION:  The following immobilization was used:Custom Moldable Pillow, breast board.   NARRATIVE: [name] was brought to the CT Simulation planning suite.  Identity was confirmed.  All relevant records and images related to the planned course of therapy were reviewed.  Then, the patient was positioned in a stable reproducible clinical set-up for radiation therapy.  Wires were placed to delineate the clinical extent of breast tissue. A wire was placed on the scar as well.  CT images were obtained.  An isocenter was placed. Skin markings were placed.  The position of the heart was then analyzed.  Due to the proximity of the heart to the chest wall, I felt she would benefit from deep inspiration breath hold for cardiac sparing.  She was then coached and rescanned in the breath hold position.  Acceptable cardiac sparing was achieved. The CT images were loaded into the planning software where the target and avoidance structures were contoured.  The radiation prescription was entered and confirmed. The patient was discharged in stable condition and tolerated simulation well. The fused images were reviewed with her.   TREATMENT PLANNING NOTE:  Treatment planning then occurred. I have requested : MLC's, isodose plan, basic dose calculation

## 2011-11-15 ENCOUNTER — Other Ambulatory Visit: Payer: Self-pay | Admitting: Oncology

## 2011-11-21 ENCOUNTER — Ambulatory Visit
Admission: RE | Admit: 2011-11-21 | Discharge: 2011-11-21 | Disposition: A | Discharge status: Home / Self Care | Payer: No Typology Code available for payment source | Source: Ambulatory Visit | Attending: Radiation Oncology | Admitting: Radiation Oncology

## 2011-11-21 DIAGNOSIS — C50419 Malignant neoplasm of upper-outer quadrant of unspecified female breast: Secondary | ICD-10-CM

## 2011-11-21 NOTE — Progress Notes (Signed)
  Radiation Oncology         [phone]) [phone] ________________________________  Name: [name] MRN: [id number]  Date: 11/21/2011  DOB: 1951-05-03  Simulation Verification Note  Status: outpatient  NARRATIVE: The patient was brought to the treatment unit and placed in the planned treatment position. The clinical setup was verified. Then port films were obtained and uploaded to the radiation oncology medical record software.  The treatment beams were carefully compared against the planned radiation fields. The position location and shape of the radiation fields was reviewed. The targeted volume of tissue appears appropriately covered by the radiation beams. Organs at risk appear to be excluded as planned.  Based on my personal review, I approved the simulation verification. The patient's treatment will proceed as planned.  ------------------------------------------------  Lurline Hare, MD For a

## 2011-11-25 ENCOUNTER — Ambulatory Visit
Admission: RE | Admit: 2011-11-25 | Discharge: 2011-11-25 | Disposition: A | Discharge status: Home / Self Care | Payer: No Typology Code available for payment source | Source: Ambulatory Visit | Attending: Radiation Oncology | Admitting: Radiation Oncology

## 2011-11-25 DIAGNOSIS — C50419 Malignant neoplasm of upper-outer quadrant of unspecified female breast: Secondary | ICD-10-CM

## 2011-11-25 MED ORDER — ALRA NON-METALLIC DEODORANT (RAD-ONC)
1.0000 "application " | Freq: Once | TOPICAL | Status: AC
  Administered 2011-11-25: 1 via TOPICAL

## 2011-11-25 MED ORDER — RADIAPLEXRX EX GEL
Freq: Once | CUTANEOUS | Status: AC
  Administered 2011-11-25: 20:00:00 via TOPICAL

## 2011-11-25 NOTE — Progress Notes (Signed)
Received patient in the clinic tonight following treatment for post sim education with Sam, RN. Patient is alert and oriented to person, place, and time. No distress noted. Steady gait noted. Pleasant affect noted. Patient denies pain at this time. Oriented patient to staff and routine of the clinic. Provided patient with RADIATION THERAPY AND YOU handbook the, reviewed pertinent information. Educated patient on potential side effect and management. Provided patient with Radiaplex and Alra then, educated upon use. Provided patient with this writers business card and encouraged to call with needs. Provided patient with and reviewed appointment calendar. All questions answered. Patient verbalized understanding of all things reviewed.

## 2011-11-26 ENCOUNTER — Encounter: Payer: Self-pay | Admitting: Radiation Oncology

## 2011-11-26 ENCOUNTER — Ambulatory Visit
Admission: RE | Admit: 2011-11-26 | Discharge: 2011-11-26 | Disposition: A | Discharge status: Home / Self Care | Payer: No Typology Code available for payment source | Source: Ambulatory Visit | Attending: Radiation Oncology | Admitting: Radiation Oncology

## 2011-11-26 ENCOUNTER — Ambulatory Visit: Payer: No Typology Code available for payment source

## 2011-11-26 VITALS — BP 129/83 | HR 70 | Resp 18 | Wt 144.4 lb

## 2011-11-26 DIAGNOSIS — C50419 Malignant neoplasm of upper-outer quadrant of unspecified female breast: Secondary | ICD-10-CM

## 2011-11-26 NOTE — Progress Notes (Signed)
Weekly Management Note Current Dose: 3.6  Gy  Projected Dose:  62.4 Gy   Narrative:  The patient presents for routine under treatment assessment.  CBCT/MVCT images/Port film x-rays were reviewed.  The chart was checked. Doing well. No complaints. Grateful for her care. Post sim education performed. Using radiaplex.   Physical Findings: Weight: 144 lb 6.4 oz (65.499 kg). Unchanged  Impression:  The patient is tolerating radiation.  Plan:  Continue treatment as planned.

## 2011-11-26 NOTE — Progress Notes (Signed)
Patient presents to the clinic today unaccompanied for under treat visit with Dr. Michell Heinrich. Patient is alert and oriented to person, place, and time. No distress noted. Steady gait noted. Pleasant affect noted. Patient denies pain at this time. Patient denies any skin changes. Patient reports using Radiaplex bid as directed. Patient has no complaints at this time. Reported all findings to Dr. Michell Heinrich.

## 2011-11-27 ENCOUNTER — Ambulatory Visit
Admission: RE | Admit: 2011-11-27 | Discharge: 2011-11-27 | Disposition: A | Discharge status: Home / Self Care | Payer: No Typology Code available for payment source | Source: Ambulatory Visit | Attending: Radiation Oncology | Admitting: Radiation Oncology

## 2011-11-27 ENCOUNTER — Encounter: Payer: Self-pay | Admitting: *Deleted

## 2011-11-28 ENCOUNTER — Ambulatory Visit
Admission: RE | Admit: 2011-11-28 | Discharge: 2011-11-28 | Disposition: A | Discharge status: Home / Self Care | Payer: No Typology Code available for payment source | Source: Ambulatory Visit | Attending: Radiation Oncology | Admitting: Radiation Oncology

## 2011-11-29 ENCOUNTER — Ambulatory Visit
Admission: RE | Admit: 2011-11-29 | Discharge: 2011-11-29 | Disposition: A | Discharge status: Home / Self Care | Payer: No Typology Code available for payment source | Source: Ambulatory Visit | Attending: Radiation Oncology | Admitting: Radiation Oncology

## 2011-11-29 ENCOUNTER — Telehealth: Payer: Self-pay | Admitting: *Deleted

## 2011-11-29 NOTE — Telephone Encounter (Signed)
Spoke with pt. to introduce self/ ACCURE study and to access for questions/concerns.  Pt. states she is doing well.  No complaints at this time.  Upcoming appointments reviewed.  Contact information provided.  All questions answered.

## 2011-12-02 ENCOUNTER — Ambulatory Visit
Admission: RE | Admit: 2011-12-02 | Discharge: 2011-12-02 | Disposition: A | Discharge status: Home / Self Care | Payer: No Typology Code available for payment source | Source: Ambulatory Visit | Attending: Radiation Oncology | Admitting: Radiation Oncology

## 2011-12-02 ENCOUNTER — Encounter: Payer: Self-pay | Admitting: *Deleted

## 2011-12-02 NOTE — Progress Notes (Signed)
Saw patient today after radiation.  Appropriately dressed for weather.  Says she is doing well.  So complaints at this time.  She understands rad. appointments are weekly.  She was encouraged to call with questions.

## 2011-12-03 ENCOUNTER — Encounter: Payer: Self-pay | Admitting: Radiation Oncology

## 2011-12-03 ENCOUNTER — Ambulatory Visit
Admission: RE | Admit: 2011-12-03 | Discharge: 2011-12-03 | Disposition: A | Discharge status: Home / Self Care | Payer: No Typology Code available for payment source | Source: Ambulatory Visit | Attending: Radiation Oncology | Admitting: Radiation Oncology

## 2011-12-03 VITALS — Wt 145.3 lb

## 2011-12-03 DIAGNOSIS — C50419 Malignant neoplasm of upper-outer quadrant of unspecified female breast: Secondary | ICD-10-CM

## 2011-12-03 NOTE — Progress Notes (Signed)
Weekly Management Note Current Dose:  12.6 Gy  Projected Dose: 62.4 Gy   Narrative:  The patient presents for routine under treatment assessment.  CBCT/MVCT images/Port film x-rays were reviewed.  The chart was checked. Has new rash around neck and in between breasts. Possibly radiaplex but not in treated area. Maybe detergent for hospital gowns as she has very sensitive skin.   Physical Findings: Weight: 145 lb 4.8 oz (65.908 kg). Unchanged flat rash on bileteral neck. Well out of rt field.   Impression:  The patient is tolerating radiation.  Plan:  Continue treatment as planned. Pt will bring own tshirt in to wear. Ok to use hydrocortisone. Pt would like to finish 1-2 days early and will schedule bid treatment of her boost with the therapists.

## 2011-12-03 NOTE — Progress Notes (Addendum)
Patient presented to the clinic today for under treat visit with Dr. Michell Heinrich. Patient is alert and oriented to person, place, and time. No distress noted. Steady gait noted. Pleasant affect noted. Patient denies pain at this time. Patient reports using Radiaplex bid as directed. Faint hyperpigmented flat itchy rash noted left side of neck. Also, raised non itchy rash noted between breast.  Reported all findings to Dr. Michell Heinrich.

## 2011-12-04 ENCOUNTER — Ambulatory Visit
Admission: RE | Admit: 2011-12-04 | Discharge: 2011-12-04 | Disposition: A | Discharge status: Home / Self Care | Payer: No Typology Code available for payment source | Source: Ambulatory Visit | Attending: Radiation Oncology | Admitting: Radiation Oncology

## 2011-12-05 ENCOUNTER — Ambulatory Visit
Admission: RE | Admit: 2011-12-05 | Discharge: 2011-12-05 | Disposition: A | Discharge status: Home / Self Care | Payer: No Typology Code available for payment source | Source: Ambulatory Visit | Attending: Radiation Oncology | Admitting: Radiation Oncology

## 2011-12-06 ENCOUNTER — Ambulatory Visit
Admission: RE | Admit: 2011-12-06 | Discharge: 2011-12-06 | Disposition: A | Discharge status: Home / Self Care | Payer: No Typology Code available for payment source | Source: Ambulatory Visit | Attending: Radiation Oncology | Admitting: Radiation Oncology

## 2011-12-10 ENCOUNTER — Ambulatory Visit
Admission: RE | Admit: 2011-12-10 | Discharge: 2011-12-10 | Disposition: A | Discharge status: Home / Self Care | Payer: No Typology Code available for payment source | Source: Ambulatory Visit | Attending: Radiation Oncology | Admitting: Radiation Oncology

## 2011-12-10 ENCOUNTER — Encounter: Payer: Self-pay | Admitting: Radiation Oncology

## 2011-12-10 ENCOUNTER — Encounter: Payer: Self-pay | Admitting: *Deleted

## 2011-12-10 VITALS — BP 113/72 | HR 81 | Resp 18 | Wt 144.2 lb

## 2011-12-10 DIAGNOSIS — C50419 Malignant neoplasm of upper-outer quadrant of unspecified female breast: Secondary | ICD-10-CM

## 2011-12-10 NOTE — Progress Notes (Signed)
Weekly Management Note Current Dose:19.8 Gy  Projected Dose: 60.4 Gy   Narrative:  The patient presents for routine under treatment assessment.  CBCT/MVCT images/Port film x-rays were reviewed.  The chart was checked. Skin rash is better.   Physical Findings: Weight: 144 lb 3.2 oz (65.409 kg). Unchanged. Skin is slightly pink on left breast.   Impression:  The patient is tolerating radiation.  Plan:  Continue treatment as planned.Continue radiaplex.

## 2011-12-10 NOTE — Progress Notes (Signed)
Saw patient today to address questions/concerns.  None reported at this time.  States she is doing well.  Patient understands radiation appointments are 5 days/week. She understands to call with questions.

## 2011-12-10 NOTE — Progress Notes (Signed)
Patient presents to the clinic today unaccompanied for an under treat visit with Dr. Michell Heinrich. Patient is alert and oriented to person, place, and time. No distress noted. Steady gait noted. Pleasant affect noted. Patient denies breast pain. No hyperpigmentation or desquamation of treatment area noted. Patient continues to used Radiaplex bid as directed. Dry rash between breast noted but, patient denies itching. Reported all findings to Dr. Michell Heinrich.

## 2011-12-11 ENCOUNTER — Ambulatory Visit
Admission: RE | Admit: 2011-12-11 | Discharge: 2011-12-11 | Disposition: A | Discharge status: Home / Self Care | Payer: No Typology Code available for payment source | Source: Ambulatory Visit | Attending: Radiation Oncology | Admitting: Radiation Oncology

## 2011-12-12 ENCOUNTER — Ambulatory Visit
Admission: RE | Admit: 2011-12-12 | Discharge: 2011-12-12 | Disposition: A | Discharge status: Home / Self Care | Payer: No Typology Code available for payment source | Source: Ambulatory Visit | Attending: Radiation Oncology | Admitting: Radiation Oncology

## 2011-12-12 NOTE — Progress Notes (Signed)
Encounter addended by: Danny Yackley Mintz Adebayo Ensminger, RN on: 12/12/2011 11:22 AM<BR>     Documentation filed: Charges VN

## 2011-12-12 NOTE — Progress Notes (Signed)
Encounter addended by: Louiza Moor Mintz Jadasia Haws, RN on: 12/12/2011 11:21 AM<BR>     Documentation filed: Charges VN

## 2011-12-12 NOTE — Progress Notes (Signed)
Encounter addended by: Delynn Flavin, RN on: 12/12/2011 11:21 AM<BR>     Documentation filed: Charges VN

## 2011-12-13 ENCOUNTER — Ambulatory Visit
Admission: RE | Admit: 2011-12-13 | Discharge: 2011-12-13 | Disposition: A | Discharge status: Home / Self Care | Payer: No Typology Code available for payment source | Source: Ambulatory Visit | Attending: Radiation Oncology | Admitting: Radiation Oncology

## 2011-12-16 ENCOUNTER — Ambulatory Visit
Admission: RE | Admit: 2011-12-16 | Discharge: 2011-12-16 | Disposition: A | Discharge status: Home / Self Care | Payer: No Typology Code available for payment source | Source: Ambulatory Visit | Attending: Radiation Oncology | Admitting: Radiation Oncology

## 2011-12-17 ENCOUNTER — Ambulatory Visit
Admission: RE | Admit: 2011-12-17 | Discharge: 2011-12-17 | Disposition: A | Discharge status: Home / Self Care | Payer: No Typology Code available for payment source | Source: Ambulatory Visit | Attending: Radiation Oncology | Admitting: Radiation Oncology

## 2011-12-17 ENCOUNTER — Encounter: Payer: Self-pay | Admitting: Radiation Oncology

## 2011-12-17 VITALS — BP 123/70 | HR 80 | Resp 18 | Wt 144.3 lb

## 2011-12-17 DIAGNOSIS — C50419 Malignant neoplasm of upper-outer quadrant of unspecified female breast: Secondary | ICD-10-CM

## 2011-12-17 NOTE — Progress Notes (Signed)
Patient presents to the clinic today unaccompanied for an under treat visit with Dr. Michell Heinrich. Patient is alert and oriented to person, place, and time. No distress noted. Steady gait noted. Pleasant affect noted. Patient denies pain at this time. Faint raised rash noted on left chest wall. Encouraged patient not to scratch area, to apply hydrocortisone and radiaplex to affect area. No other skin changes of the treated breast noted. Patient remain active, exercising five days a week. Patient has no complaints. Reported all findings to Dr. Michell Heinrich.

## 2011-12-17 NOTE — Progress Notes (Signed)
Weekly Management Note Current Dose: 28.8  Gy  Projected Dose: 62.4 Gy   Narrative:  The patient presents for routine under treatment assessment.  CBCT/MVCT images/Port film x-rays were reviewed.  The chart was checked. Doing well. Some dermatitis medially on left breast. Would like to double up treatments for boost in order to finish earlier. Went to [facility] class and had questions about lymphedema risk.  Physical Findings: Weight: 144 lb 4.8 oz (65.454 kg). Dermatitis medially.  Impression:  The patient is tolerating radiation.  Plan:  Continue treatment as planned. Discussed lymphedema risk is only when axilla is being specifically targeted which hers is not. OK to double up on boost at end of treatment. Pt will discuss with treatment machine.

## 2011-12-18 ENCOUNTER — Ambulatory Visit
Admission: RE | Admit: 2011-12-18 | Discharge: 2011-12-18 | Disposition: A | Discharge status: Home / Self Care | Payer: No Typology Code available for payment source | Source: Ambulatory Visit | Attending: Radiation Oncology | Admitting: Radiation Oncology

## 2011-12-19 ENCOUNTER — Ambulatory Visit
Admission: RE | Admit: 2011-12-19 | Discharge: 2011-12-19 | Disposition: A | Discharge status: Home / Self Care | Payer: No Typology Code available for payment source | Source: Ambulatory Visit | Attending: Radiation Oncology | Admitting: Radiation Oncology

## 2011-12-20 ENCOUNTER — Ambulatory Visit
Admission: RE | Admit: 2011-12-20 | Discharge: 2011-12-20 | Disposition: A | Discharge status: Home / Self Care | Payer: No Typology Code available for payment source | Source: Ambulatory Visit | Attending: Radiation Oncology | Admitting: Radiation Oncology

## 2011-12-23 ENCOUNTER — Ambulatory Visit
Admission: RE | Admit: 2011-12-23 | Discharge: 2011-12-23 | Disposition: A | Discharge status: Home / Self Care | Payer: No Typology Code available for payment source | Source: Ambulatory Visit | Attending: Radiation Oncology | Admitting: Radiation Oncology

## 2011-12-23 ENCOUNTER — Other Ambulatory Visit: Payer: No Typology Code available for payment source

## 2011-12-24 ENCOUNTER — Ambulatory Visit
Admission: RE | Admit: 2011-12-24 | Discharge: 2011-12-24 | Disposition: A | Discharge status: Home / Self Care | Payer: No Typology Code available for payment source | Source: Ambulatory Visit | Attending: Radiation Oncology | Admitting: Radiation Oncology

## 2011-12-24 ENCOUNTER — Encounter: Payer: Self-pay | Admitting: Radiation Oncology

## 2011-12-24 ENCOUNTER — Other Ambulatory Visit: Payer: No Typology Code available for payment source

## 2011-12-24 VITALS — BP 126/65 | HR 78 | Resp 18 | Wt 146.0 lb

## 2011-12-24 DIAGNOSIS — C50419 Malignant neoplasm of upper-outer quadrant of unspecified female breast: Secondary | ICD-10-CM

## 2011-12-24 MED ORDER — BIAFINE EX EMUL
Freq: Every day | CUTANEOUS | Status: DC
  Administered 2011-12-24: 15:00:00 via TOPICAL

## 2011-12-24 NOTE — Progress Notes (Signed)
Name: [name]   MRN: [id number]  Date:  12/24/2011   DOB: [birth year]  Status:outpatient    DIAGNOSIS: Breast cancer.  CONSENT VERIFIED: yes   SET UP: Patient is setup supine   IMMOBILIZATION:  The following immobilization was used:Custom Moldable Pillow, breast board.   NARRATIVE: [name] underwent complex simulation and treatment planning for her boost treatment today.  Her tumor volume was outlined on the planning CT scan.  Due to the depth of her cavity, electrons could not be used and a photon plan was developed. The plan will be prescribed to the 97%  Isodose line.   3   fields will be used with  MLCs comprising 3  treatment devices.

## 2011-12-24 NOTE — Progress Notes (Signed)
Received patient in the clinic for PUT with Dr. Michell Heinrich. Patient is alert and oriented to person, place, and time. No distress noted. Steady gait noted. Pleasant affect noted. Patient denies pain at this time. Hyperpigmented skin of left/treated breast noted without desquamation. Left chest wall itchy rash noted. Patient reports using No_Ad on itchy skin because cortisone was not helping. Patient reports using Radiaplex as directed. Reported all findings to Dr. Michell Heinrich.

## 2011-12-24 NOTE — Progress Notes (Signed)
Weekly Management Note Current Dose:  36 Gy  Projected Dose: 60 Gy   Narrative:  The patient presents for routine under treatment assessment.  CBCT/MVCT images/Port film x-rays were reviewed.  The chart was checked. Doing well.  Some itching over upper breast. Used No-Ad and improved. Tired. Doubling up on treatments next week to finish in time to go to the beach.  Physical Findings: Weight: 146 lb (66.225 kg). Dermatitis over upper portion of breast. Inframammary folds clean  Impression:  The patient is tolerating radiation.  Plan:  Continue treatment as planned. Switch to biafene.

## 2011-12-25 ENCOUNTER — Ambulatory Visit
Admission: RE | Admit: 2011-12-25 | Discharge: 2011-12-25 | Disposition: A | Discharge status: Home / Self Care | Payer: No Typology Code available for payment source | Source: Ambulatory Visit | Attending: Radiation Oncology | Admitting: Radiation Oncology

## 2011-12-26 ENCOUNTER — Ambulatory Visit
Admission: RE | Admit: 2011-12-26 | Discharge: 2011-12-26 | Disposition: A | Discharge status: Home / Self Care | Payer: No Typology Code available for payment source | Source: Ambulatory Visit | Attending: Radiation Oncology | Admitting: Radiation Oncology

## 2011-12-27 ENCOUNTER — Ambulatory Visit
Admission: RE | Admit: 2011-12-27 | Discharge: 2011-12-27 | Disposition: A | Discharge status: Home / Self Care | Payer: No Typology Code available for payment source | Source: Ambulatory Visit | Attending: Radiation Oncology | Admitting: Radiation Oncology

## 2011-12-30 ENCOUNTER — Ambulatory Visit
Admission: RE | Admit: 2011-12-30 | Discharge: 2011-12-30 | Disposition: A | Discharge status: Home / Self Care | Payer: No Typology Code available for payment source | Source: Ambulatory Visit | Attending: Radiation Oncology | Admitting: Radiation Oncology

## 2011-12-31 ENCOUNTER — Ambulatory Visit
Admission: RE | Admit: 2011-12-31 | Discharge: 2011-12-31 | Disposition: A | Discharge status: Home / Self Care | Payer: No Typology Code available for payment source | Source: Ambulatory Visit | Attending: Radiation Oncology | Admitting: Radiation Oncology

## 2011-12-31 ENCOUNTER — Ambulatory Visit
Admission: RE | Admit: 2011-12-31 | Discharge: 2011-12-31 | Disposition: A | Discharge status: Home / Self Care | Payer: No Typology Code available for payment source | Source: Ambulatory Visit | Attending: Oncology | Admitting: Oncology

## 2011-12-31 ENCOUNTER — Encounter: Payer: Self-pay | Admitting: Radiation Oncology

## 2011-12-31 VITALS — BP 133/75 | HR 73 | Temp 96.8°F | Resp 20 | Wt 144.3 lb

## 2011-12-31 DIAGNOSIS — C50419 Malignant neoplasm of upper-outer quadrant of unspecified female breast: Secondary | ICD-10-CM

## 2011-12-31 NOTE — Progress Notes (Signed)
Patient alert and oriented x3,  Patients left breast bright erythema,no desquamation seen, slight dermatitis on top of breast, vitals wnl, no c/o pain, biafine has helped calm the itching stated by patient 2:23 PM

## 2011-12-31 NOTE — Progress Notes (Signed)
   Department of Radiation Oncology  Phone:  [phone] Fax:        [phone]   Weekly Management Note Current Dose:  46.8 Gy  Projected Dose: 62.4  Gy   Narrative:  The patient presents for routine under treatment assessment. Port film x-rays were reviewed.  The chart was checked.  She is doing well this week. She has some fatigue. Her pruritis and discomfort in the breast area has been improved with using Biafine.  Physical Findings: Weight: 144 lb 4.8 oz (65.454 kg). The lungs are clear. The heart has regular rhythm and rate. The left breast area shows brisk erythema without any significant dry and no moist desquamation.  Impression:  The patient is tolerating radiation.  Plan:  Continue treatment as planned.   -----------------------------------  Billie Lade, PhD, MD

## 2012-01-01 ENCOUNTER — Ambulatory Visit
Admission: RE | Admit: 2012-01-01 | Discharge: 2012-01-01 | Disposition: A | Discharge status: Home / Self Care | Payer: No Typology Code available for payment source | Source: Ambulatory Visit | Attending: Radiation Oncology | Admitting: Radiation Oncology

## 2012-01-02 ENCOUNTER — Ambulatory Visit
Admission: RE | Admit: 2012-01-02 | Discharge: 2012-01-02 | Disposition: A | Discharge status: Home / Self Care | Payer: No Typology Code available for payment source | Source: Ambulatory Visit | Attending: Radiation Oncology | Admitting: Radiation Oncology

## 2012-01-02 DIAGNOSIS — C50419 Malignant neoplasm of upper-outer quadrant of unspecified female breast: Secondary | ICD-10-CM

## 2012-01-02 NOTE — Progress Notes (Signed)
  Radiation Oncology         [phone]) [phone] ________________________________  Name: [name] MRN: [id number]  Date: 01/02/2012  DOB: 12/27/1950  Simulation Verification Note  Status: outpatient  NARRATIVE: The patient was brought to the treatment unit and placed in the planned treatment position. The clinical setup was verified. Then port films were obtained and uploaded to the radiation oncology medical record software.  The treatment beams were carefully compared against the planned radiation fields. The position location and shape of the radiation fields was reviewed. They targeted volume of tissue appears to be appropriately covered by the radiation beams. Organs at risk appear to be excluded as planned.  Based on my personal review, I approved the simulation verification. The patient's treatment will proceed as planned.  -----------------------------------  Billie Lade, PhD, MD

## 2012-01-03 ENCOUNTER — Ambulatory Visit
Admission: RE | Admit: 2012-01-03 | Discharge: 2012-01-03 | Disposition: A | Discharge status: Home / Self Care | Payer: No Typology Code available for payment source | Source: Ambulatory Visit | Attending: Radiation Oncology | Admitting: Radiation Oncology

## 2012-01-06 ENCOUNTER — Ambulatory Visit
Admission: RE | Admit: 2012-01-06 | Discharge: 2012-01-06 | Disposition: A | Discharge status: Home / Self Care | Payer: No Typology Code available for payment source | Source: Ambulatory Visit | Attending: Radiation Oncology | Admitting: Radiation Oncology

## 2012-01-07 ENCOUNTER — Ambulatory Visit
Admission: RE | Admit: 2012-01-07 | Discharge: 2012-01-07 | Disposition: A | Discharge status: Home / Self Care | Payer: No Typology Code available for payment source | Source: Ambulatory Visit | Attending: Radiation Oncology | Admitting: Radiation Oncology

## 2012-01-07 ENCOUNTER — Encounter: Payer: Self-pay | Admitting: Radiation Oncology

## 2012-01-07 VITALS — BP 122/72 | HR 80 | Resp 18 | Wt 142.9 lb

## 2012-01-07 DIAGNOSIS — C50419 Malignant neoplasm of upper-outer quadrant of unspecified female breast: Secondary | ICD-10-CM

## 2012-01-07 NOTE — Progress Notes (Signed)
Patient presents to the clinic today unaccompanied for an under treat visit with Dr. Michell Heinrich. Patient is alert and oriented to person, place, and time. No distress noted. Steady gait noted. Pleasant affect noted. Patient denies pain/breast pain at this time. Hyperpigmentation without desquamation noted. Patient reports using Biafine bid as directed. Reinforced skin care. Patient completes tomorrow. Provided patient with appointment card to return in one month for follow up. Patient has already attended [facility]. Discussed FYNN. Reported all findings to Dr. Michell Heinrich. Encouraged patient to call with needs and she verbalized understanding

## 2012-01-07 NOTE — Progress Notes (Signed)
Weekly Management Note Current Dose:  58.4 Gy  Projected Dose: 62.4 Gy   Narrative:  The patient presents for routine under treatment assessment.  CBCT/MVCT images/Port film x-rays were reviewed.  The chart was checked. Skin irritated. Painful when she sleeps.  Looking forward to vacation. Feels treatment more now.  Hass appointment with [name]. Concerned due to conversation with friends re: frozen shoulder. Has been to [facility] class.  Physical Findings: Weight: 142 lb 14.4 oz (64.819 kg). Left breast irritated. No moist desquamation.   Impression:  The patient is tolerating radiation.  Plan:  Continue treatment as planned. Continue biafene. Discussed lotion with vitamin e.  Discussed low likelihood of frozen shoulder given SLNB alone.  Discussed she did not need compression garment to fly. F/u in 1 month.

## 2012-01-08 ENCOUNTER — Ambulatory Visit
Admission: RE | Admit: 2012-01-08 | Discharge: 2012-01-08 | Disposition: A | Discharge status: Home / Self Care | Payer: No Typology Code available for payment source | Source: Ambulatory Visit | Attending: Radiation Oncology | Admitting: Radiation Oncology

## 2012-01-08 ENCOUNTER — Encounter: Payer: Self-pay | Admitting: Radiation Oncology

## 2012-01-09 ENCOUNTER — Ambulatory Visit: Payer: No Typology Code available for payment source

## 2012-01-10 ENCOUNTER — Ambulatory Visit: Payer: No Typology Code available for payment source

## 2012-01-12 NOTE — Progress Notes (Signed)
  Radiation Oncology         [phone]) [phone] ________________________________  Name: [name] MRN: [id number]  Date: 01/08/2012  DOB: 1951-06-19  End of Treatment Note  Diagnosis:   The encounter diagnosis was Breastr cancer, IDC, Left UOQ, clinical stage II Receptor +, Her 2 -.  Indication for treatment:  Curative       Radiation treatment dates:  11/25/2011-01/08/2012  Site/dose:  Left breast / 45 Gray @ 1.8 Wallace Cullens per fraction x 25 fractions Left breast boost / 16 Gray @ 2 Wallace Cullens per fraction x 8 fractions  Beams/energy:   Opposed tangents with reduced fields and breath hold technique / 6 & 10 MV photons 3 field / 6 MV photons  Narrative: The patient tolerated radiation treatment relatively well.   She was able to work and exercise through the course of treatment.  She had the expected dermatitis which was managed with hydrocortisone.   Plan: The patient has completed radiation treatment. The patient will return to radiation oncology clinic for routine followup in one month. I advised them to call or return sooner if they have any questions or concerns related to their recovery or treatment.  ------------------------------------------------  Lurline Hare, MD

## 2012-01-16 NOTE — Progress Notes (Signed)
On 5/7/13[name] underwent 3 dimensional simulation and treatment planning.  I analyzed a DVH of her heart, lungs and tumor cavity.  MLC changes were made based on this analysis and breath hold technique will be employed to increase cardiac sparing.

## 2012-01-21 ENCOUNTER — Encounter (INDEPENDENT_AMBULATORY_CARE_PROVIDER_SITE_OTHER): Payer: Self-pay | Admitting: Surgery

## 2012-01-21 ENCOUNTER — Ambulatory Visit (INDEPENDENT_AMBULATORY_CARE_PROVIDER_SITE_OTHER): Payer: No Typology Code available for payment source | Admitting: Surgery

## 2012-01-21 VITALS — BP 130/84 | HR 68 | Temp 97.4°F | Ht 65.0 in | Wt 145.4 lb

## 2012-01-21 DIAGNOSIS — Z853 Personal history of malignant neoplasm of breast: Secondary | ICD-10-CM

## 2012-01-21 NOTE — Patient Instructions (Signed)
See me again next April

## 2012-01-21 NOTE — Progress Notes (Signed)
Chief complaint: Followup after radiation therapy  History of present illness: This patient is undergone lumpectomy a few months ago and is now completed radiation therapy. She comes back in for a baseline post radiation exam. She overall feels like she is doing very well.  Exam: Vital signs:BP 130/84  Pulse 68  Temp 97.4 F (36.3 C) (Temporal)  Ht 5\' 5"  (1.651 m)  Wt 145 lb 6.4 oz (65.953 kg)  BMI 24.20 kg/m2  SpO2 98% General: Patient alert oriented healthy-appearing Breasts: She has some post radiation changes of the left breast with a little bit of fullness at the lumpectomy site, and a little bit of edema around the nipple with some retraction of the left nipple. However these are minimal and she had an excellent result.  Impression: Doing well  Plan: I'll see her back for one-year followup in April.

## 2012-01-22 ENCOUNTER — Other Ambulatory Visit (HOSPITAL_BASED_OUTPATIENT_CLINIC_OR_DEPARTMENT_OTHER): Payer: No Typology Code available for payment source | Admitting: Lab

## 2012-01-22 DIAGNOSIS — C50419 Malignant neoplasm of upper-outer quadrant of unspecified female breast: Secondary | ICD-10-CM

## 2012-01-22 LAB — CBC WITH DIFFERENTIAL/PLATELET
BASO%: 0.7 % (ref 0.0–2.0)
EOS%: 1.2 % (ref 0.0–7.0)
HCT: 37.1 % (ref 34.8–46.6)
LYMPH%: 11.8 % — ABNORMAL LOW (ref 14.0–49.7)
MCH: 30.1 pg (ref 25.1–34.0)
MCHC: 33.4 g/dL (ref 31.5–36.0)
NEUT%: 76.7 % (ref 38.4–76.8)
Platelets: 163 10*3/uL (ref 145–400)
RBC: 4.12 10*6/uL (ref 3.70–5.45)
WBC: 4.7 10*3/uL (ref 3.9–10.3)
lymph#: 0.6 10*3/uL — ABNORMAL LOW (ref 0.9–3.3)

## 2012-01-23 LAB — COMPREHENSIVE METABOLIC PANEL
ALT: 18 U/L (ref 0–35)
AST: 25 U/L (ref 0–37)
Alkaline Phosphatase: 61 U/L (ref 39–117)
Creatinine, Ser: 1.15 mg/dL — ABNORMAL HIGH (ref 0.50–1.10)
Sodium: 137 mEq/L (ref 135–145)
Total Bilirubin: 0.6 mg/dL (ref 0.3–1.2)
Total Protein: 6.4 g/dL (ref 6.0–8.3)

## 2012-01-23 LAB — VITAMIN D 25 HYDROXY (VIT D DEFICIENCY, FRACTURES): Vit D, 25-Hydroxy: 72 ng/mL (ref 30–89)

## 2012-01-27 ENCOUNTER — Ambulatory Visit (HOSPITAL_BASED_OUTPATIENT_CLINIC_OR_DEPARTMENT_OTHER): Payer: No Typology Code available for payment source | Admitting: Oncology

## 2012-01-27 ENCOUNTER — Encounter: Payer: Self-pay | Admitting: Oncology

## 2012-01-27 VITALS — BP 113/73 | HR 73 | Temp 98.4°F | Ht 65.0 in | Wt 145.3 lb

## 2012-01-27 DIAGNOSIS — Z170 Estrogen receptor positive status [ER+]: Secondary | ICD-10-CM

## 2012-01-27 DIAGNOSIS — C50419 Malignant neoplasm of upper-outer quadrant of unspecified female breast: Secondary | ICD-10-CM

## 2012-01-27 DIAGNOSIS — M1990 Unspecified osteoarthritis, unspecified site: Secondary | ICD-10-CM | POA: Insufficient documentation

## 2012-01-27 MED ORDER — TAMOXIFEN CITRATE 20 MG PO TABS: 20.0000 mg | ORAL_TABLET | Freq: Every day | ORAL | Status: DC

## 2012-01-27 MED ORDER — TAMOXIFEN CITRATE 20 MG PO TABS: 20.0000 mg | ORAL_TABLET | Freq: Every day | ORAL | Status: AC

## 2012-01-27 NOTE — Progress Notes (Signed)
[name]    01/27/2012  ID: [name]   DOB: 14-Feb-1951  MR#: [id number]  CSN#:[id number]  HISTORY OF PRESENT ILLNESS: The patient had routine screening mammography at [facility] 2013, suggesting a potential abnormality in the left breast. Additional views 08/29/2011 confirmed an irregular mass with architectural or distortion measuring up to 4 cm in the left breast, without associated microcalcifications. By ultrasound this was irregular, and showed posterior shadowing. It measured 3.6 cm sonographically. There were no suspicious lymph nodes noted in the left axilla.  The same day the patient underwent ultrasound-guided biopsy of the left breast mass, showing ([id number]) and invasive ductal carcinoma, grade 1, which was 100% estrogen receptor and 97% progesterone receptor positive. The proliferation marker was 20%. There was no HER-2 amplification with a ratio by CISH of 1.09.  The patient had bilateral breast MRI 09/03/2011 showing a solitary 3.8 cm left breast lesion, and on 11/04/2011 underwent left lumpectomy and sentinel lymph node sampling with results and subsequent history as detailed below.  INTERVAL HISTORY: [name] returns today with her husband [name] for followup of her breast cancer. She completed her radiation treatments late June. Overall she did well with the os. She was a little tired the last couple of weeks and has not quite recovered from that yet. Of course she had some dry desquamation, but that has largely resolved. 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  REVIEW OF SYSTEMS:  She found that she was allergic to the gallant in the radiation area and had a significant rash from that. That has resolved. As stated her fatigue is improving but not entirely gone a detailed review of systems was otherwise  entirely negative.  PAST MEDICAL HISTORY: Past Medical History  Diagnosis Date  . Hyperlipidemia   . Breastr cancer, IDC, Left UOQ, clinical stage II Receptor +, Her 2 - 08/29/2011  . Hypothyroidism   . Osteoarthritis     PAST SURGICAL HISTORY: Past Surgical History  Procedure Date  . Ectopic pregnancy surgery 1986-88    s/p unilateral salpingectomy  . Bunionectomy 2000    Left  . Wisdom tooth extraction   . Breast lumpectomy 11/04/11    FAMILY HISTORY Family History  Problem Relation Age of Onset  . Heart disease Mother   . Heart disease Father   . Cancer Maternal Aunt     ovarian  . Heart disease Paternal Uncle    the patient's father died at the age of 96 from myocardial infarction. The patient's mother died from congestive heart failure at the age of 33. The patient has one brother age 65 with prostate cancer diagnosed at age 53. The patient's sister is 73 as of 2013. There is no breast or ovarian cancer in the family  GYNECOLOGIC HISTORY: She had menarche at around age 37. She is GX P1, first pregnancy to term age 61. She stopped having periods about 2003. 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 Her husband [name]  has had prostate cancer, status post radiation treatments in [place] about 2 years ago. Her daughter [name] lives in [place]. She has  2 children. 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 The patient attends the [employer].   ADVANCED DIRECTIVES:  HEALTH MAINTENANCE: History  Substance Use Topics  . Smoking status: Never Smoker   . Smokeless tobacco: Never Used  . Alcohol Use: Yes     Colonoscopy: repeat due 2015  PAP: UTD ([facility])  Bone density: remote  Lipid panel: under treatment  Allergies  Allergen Reactions  . Oxycodone Anxiety    . Epinephrine     Sever headaches  . Iodine Rash  . Nickel Rash    Rash and blisters  . Penicillins Rash    Rash and blisters  . Sulfa Drugs Cross Reactors Rash    Current Outpatient Prescriptions  Medication Sig Dispense Refill  . atorvastatin (LIPITOR) 10 MG tablet Take 10 mg by mouth daily.      . B Complex-[name] (SUPER B COMPLEX PO) Take by mouth.      [name] CALCIUM PO Take 700 mg by mouth daily.      . Cholecalciferol (VITAMIN D-3 PO) Take 2,000 Units by mouth daily.      . Coenzyme Q10 (COQ10) 100 MG CAPS Take by mouth.      . emollient (BIAFINE) cream Apply topically as needed.      [name] levothyroxine (SYNTHROID, LEVOTHROID) 75 MCG tablet Take 75 mcg by mouth every other day.      . levothyroxine (SYNTHROID, LEVOTHROID) 88 MCG tablet Take 88 mcg by mouth every other day.      . Magnesium 400 MG CAPS Take by mouth daily.      . mometasone (NASONEX) 50 MCG/ACT nasal spray Place 2 sprays into the nose daily.      . Multiple Vitamin (MULTIVITAMIN) capsule Take 1 capsule by mouth daily.      . NON FORMULARY Mangosteen 3 oz QD      . non-metallic deodorant (ALRA) MISC Apply 1 application topically daily as needed.      . vitamin E (VITAMIN E) 400 UNIT capsule Take 400 Units by mouth daily.      . Wound Cleansers (RADIAPLEX EX) Apply topically.      . tamoxifen (NOLVADEX) 20 MG tablet Take 1 tablet (20 mg total) by mouth daily.  90 tablet  12    OBJECTIVE: Middle-aged white woman who appears well Filed Vitals:   01/27/12 1622  BP: 113/73  Pulse: 73  Temp: 98.4 F (36.9 [name])     Body mass index is 24.18 kg/(m^2).    ECOG FS: 0  Sclerae unicteric Oropharynx clear No peripheral adenopathy  Lungs no rales or rhonchi Heart regular rate and rhythm Abd benign MSK no focal spinal tenderness, specifically no peripheral edema LUE Neuro: nonfocal Breasts: The right breast is unremarkable. The left breast is status post lumpectomy and radiation. There is minimal residual dry desquamation.  There is some hyperpigmentation. There is no evidence of disease recurrence. The left axilla is clear.  LAB RESULTS: Lab Results  Component Value Date   WBC 4.7 01/22/2012   NEUTROABS 3.6 01/22/2012   HGB 12.4 01/22/2012   HCT 37.1 01/22/2012   MCV 90.1 01/22/2012   PLT 163 01/22/2012      Chemistry      Component Value Date/Time   NA 137 01/22/2012 1106   K 4.3 01/22/2012 1106   CL 104 01/22/2012 1106   CO2 27 01/22/2012 1106   BUN 16 01/22/2012 1106   CREATININE 1.15* 01/22/2012 1106  Component Value Date/Time   CALCIUM 9.8 01/22/2012 1106   ALKPHOS 61 01/22/2012 1106   AST 25 01/22/2012 1106   ALT 18 01/22/2012 1106   BILITOT 0.6 01/22/2012 1106       Lab Results  Component Value Date   LABCA2 15 10/02/2011    No components found with this basename: [id number]    No results found for this basename: INR:1;PROTIME:1 in the last 168 hours  Urinalysis    Component Value Date/Time   COLORURINE YELLOW 10/31/2011 0859    STUDIES: [facility] density at [facility] June 2013 showed a normal T-spine and mild osteopenia at the left femoral neck, with a T. score of -1.4.   ASSESSMENT: 61 y.o.  Pura Spice woman status post left lumpectomy 11/04/2011 for a T2 N0, stage IIA invasive ductal carcinoma, grade 2, strongly estrogen and progesterone receptor positive, HER-2 negative, with an MIB-1-1 of 20%. She completed radiation treatments June 2013 and starts tamoxifen mid-July 2013  PLAN: We had a long discussion regarding aromatase inhibitors versus tamoxifen and after comparison of the possible toxicities side effects and complications as well as the benefits, she and Bill both feel starting tamoxifen would be best for her. They understand that tamoxifen for 5 years is inferior to anastrozole for 5 years, but not to the combination on of tamoxifen followed by anastrozole, or to tamoxifen for 10 years. She is interested in the idea of taking something for 10 years, and so likely she will be  on tamoxifen for 5 years and then decide whether she wants to continue or switch to an aromatase inhibitor for the remaining 5.  Accordingly I went ahead and wrote her the prescription. She will call for any problems that may develop from this medication, otherwise she will return to see me again late October. If she is tolerating it well then we will start seeing her on a every 6 month basis alternating with her gynecologist and surgeon.    Yostin Malacara [name]    01/27/2012

## 2012-02-06 ENCOUNTER — Encounter: Payer: Self-pay | Admitting: Radiation Oncology

## 2012-02-06 ENCOUNTER — Encounter: Payer: Self-pay | Admitting: *Deleted

## 2012-02-06 ENCOUNTER — Ambulatory Visit
Admission: RE | Admit: 2012-02-06 | Discharge: 2012-02-06 | Disposition: A | Discharge status: Home / Self Care | Payer: No Typology Code available for payment source | Source: Ambulatory Visit | Attending: Radiation Oncology | Admitting: Radiation Oncology

## 2012-02-06 VITALS — BP 135/86 | HR 51 | Temp 97.7°F | Resp 18 | Wt 144.8 lb

## 2012-02-06 DIAGNOSIS — C50419 Malignant neoplasm of upper-outer quadrant of unspecified female breast: Secondary | ICD-10-CM

## 2012-02-06 NOTE — Progress Notes (Signed)
Pt. relates things are going well.  She spoke very highly of the cancer center and its staff.  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  Relates occasional fatigue.  Encouraged to listen to her body- and not over do it.  Will see rad. onc today.  Patient asked if RN would continue to f/u with her.  RN said yes.  Patient did not voice any other concerns/questions.  She understands to call if they arise.  She verbalized understanding and agreed.

## 2012-02-06 NOTE — Progress Notes (Signed)
Patient presents to the clinic today for a follow up appointment with Dr. Michell Heinrich. Patient is alert and oriented to person, place, and time. No distress noted. Steady gait noted. Pleasant affect noted. Patient denies pain at this time. Patient denies breast pain at this time. Patient reports her energy level is improving. Patient states, "I am not back to normal but, I am functioning well." Patient reports her left breast is occasionally itchy but, she continues to use Biafine. Patient reports the itch is worse at night. Patient taking Tamoxifen daily without complication. Weight stable. Denies nausea, vomiting, headache or dizziness. Reported all findings to Dr. Michell Heinrich.

## 2012-02-06 NOTE — Progress Notes (Signed)
Department of Radiation Oncology  Phone:  [phone] Fax:        [phone]   Name: [name]   DOB: 05/06/1951  MRN: [id number]    Date: 02/06/2012  Follow Up Visit Note  Diagnosis: Stage II Breast Cancer of the left breast  Interval since last radiation: 1 month  Interval History: [name] presents today for routine followup.  Her skin is healed up well. She is pleased with her cosmetic result. She had a bone density scan which was normal. She had a discussion with Dr. Daneil Dolin not and has elected to start on tamoxifen. She has some occasional itching in her left breast other than that is feeling well. Her energy levels are returning to normal. She is back to work.  Allergies:  Allergies  Allergen Reactions  . Oxycodone Anxiety  . Epinephrine     Sever headaches  . Other     Hospital gown  . Iodine Rash  . Nickel Rash    Rash and blisters  . Penicillins Rash    Rash and blisters  . Sulfa Drugs Cross Reactors Rash    Medications:  Current Outpatient Prescriptions  Medication Sig Dispense Refill  . atorvastatin (LIPITOR) 10 MG tablet Take 10 mg by mouth daily.      . B Complex-C (SUPER B COMPLEX PO) Take by mouth.      [name] CALCIUM PO Take 700 mg by mouth daily.      . Cholecalciferol (VITAMIN D-3 PO) Take 2,000 Units by mouth daily.      . Coenzyme Q10 (COQ10) 100 MG CAPS Take by mouth.      . emollient (BIAFINE) cream Apply topically as needed.      [name] levothyroxine (SYNTHROID, LEVOTHROID) 75 MCG tablet Take 75 mcg by mouth every other day.      . levothyroxine (SYNTHROID, LEVOTHROID) 88 MCG tablet Take 88 mcg by mouth every other day.      . Magnesium 400 MG CAPS Take by mouth daily.      . mometasone (NASONEX) 50 MCG/ACT nasal spray Place 2 sprays into the nose daily.      . Multiple Vitamin (MULTIVITAMIN) capsule Take 1 capsule by mouth daily.      . NON FORMULARY Mangosteen 3 oz QD      . non-metallic deodorant (ALRA) MISC Apply 1 application topically  daily as needed.      . tamoxifen (NOLVADEX) 20 MG tablet Take 1 tablet (20 mg total) by mouth daily.  90 tablet  12  . vitamin E (VITAMIN E) 400 UNIT capsule Take 400 Units by mouth daily.      . Wound Cleansers (RADIAPLEX EX) Apply topically.        Physical Exam:   weight is 144 lb 12.8 oz (65.681 kg). Her oral temperature is 97.7 F (36.5 C). Her blood pressure is 135/86 and her pulse is 51. Her respiration is 18.  She has really Cosmetic result. Her left breast is slightly elevated as compared to her right. There is a minimal amount of volume loss. There is really very little hyperpigmentation in the medial aspect and the most lateral aspect underneath the axilla.  IMPRESSION: [name] is a 61 y.o. female with resolving acute effects of treatment  PLAN:  [name] looks great. She is pleased with her cosmetic result. She is on tamoxifen and tolerating that well. She is scheduled follow up Dr. Jamey Ripa and Dr. Darnelle Catalan. I have not scheduled followup with me. I be  happy to see her back in appearance basis. I've encouraged her to contact me with any questions or concerns. We discussed continued skin protection in the treated area.    Lurline Hare, MD

## 2012-03-23 ENCOUNTER — Other Ambulatory Visit: Payer: Self-pay | Admitting: *Deleted

## 2012-03-23 DIAGNOSIS — C50419 Malignant neoplasm of upper-outer quadrant of unspecified female breast: Secondary | ICD-10-CM

## 2012-03-31 ENCOUNTER — Telehealth (INDEPENDENT_AMBULATORY_CARE_PROVIDER_SITE_OTHER): Payer: Self-pay | Admitting: General Surgery

## 2012-03-31 ENCOUNTER — Telehealth: Payer: Self-pay | Admitting: *Deleted

## 2012-03-31 NOTE — Telephone Encounter (Signed)
Spoke with patient today to follow-up with needs or concerns.  Says she has noticed swelling at her incision site is going down, but she has a knot.  Unsure if it has been there.  Denies redness, heat, drainage, streaks at site etc.  Does not verbalize fever.  Says pain is at a minimum. Did not rate it on pain scale. Verbalized, "she could live with it for the rest of her life", referring to pain level.  She has not used packs to area.  She was strongly encouraged to call surgeons office for treatment recommendation today, even though she is out of town.  Contact information provided.  Patient agreed to f/u and understands to call if further situations develop. She is aware of future appointments.  All questions answered.

## 2012-03-31 NOTE — Telephone Encounter (Signed)
Pt called to ask about transient pain at site of lumpectomy.  Discussion of scar tissue maturing as likely reason for pain.  She will monitor for another week and call back if no better.

## 2012-04-23 ENCOUNTER — Other Ambulatory Visit: Payer: Self-pay | Admitting: Emergency Medicine

## 2012-04-23 DIAGNOSIS — C50419 Malignant neoplasm of upper-outer quadrant of unspecified female breast: Secondary | ICD-10-CM

## 2012-05-11 ENCOUNTER — Other Ambulatory Visit (HOSPITAL_BASED_OUTPATIENT_CLINIC_OR_DEPARTMENT_OTHER): Payer: No Typology Code available for payment source | Admitting: Lab

## 2012-05-11 ENCOUNTER — Telehealth: Payer: Self-pay | Admitting: *Deleted

## 2012-05-11 ENCOUNTER — Ambulatory Visit (HOSPITAL_BASED_OUTPATIENT_CLINIC_OR_DEPARTMENT_OTHER): Payer: No Typology Code available for payment source | Admitting: Oncology

## 2012-05-11 VITALS — BP 146/82 | HR 73 | Temp 98.2°F | Resp 20 | Ht 65.0 in | Wt 147.8 lb

## 2012-05-11 DIAGNOSIS — M949 Disorder of cartilage, unspecified: Secondary | ICD-10-CM

## 2012-05-11 DIAGNOSIS — M899 Disorder of bone, unspecified: Secondary | ICD-10-CM

## 2012-05-11 DIAGNOSIS — C50419 Malignant neoplasm of upper-outer quadrant of unspecified female breast: Secondary | ICD-10-CM

## 2012-05-11 DIAGNOSIS — Z170 Estrogen receptor positive status [ER+]: Secondary | ICD-10-CM

## 2012-05-11 LAB — CBC WITH DIFFERENTIAL/PLATELET
BASO%: 0.9 % (ref 0.0–2.0)
EOS%: 1.5 % (ref 0.0–7.0)
HCT: 35.8 % (ref 34.8–46.6)
LYMPH%: 19.1 % (ref 14.0–49.7)
MCH: 31.9 pg (ref 25.1–34.0)
MCHC: 34.8 g/dL (ref 31.5–36.0)
MCV: 91.6 fL (ref 79.5–101.0)
MONO#: 0.4 10*3/uL (ref 0.1–0.9)
MONO%: 8.2 % (ref 0.0–14.0)
NEUT%: 70.3 % (ref 38.4–76.8)
Platelets: 155 10*3/uL (ref 145–400)

## 2012-05-11 LAB — COMPREHENSIVE METABOLIC PANEL (CC13)
ALT: 19 U/L (ref 0–55)
Alkaline Phosphatase: 47 U/L (ref 40–150)
CO2: 24 mEq/L (ref 22–29)
Creatinine: 1.1 mg/dL (ref 0.6–1.1)
Total Bilirubin: 0.6 mg/dL (ref 0.20–1.20)

## 2012-05-11 LAB — TSH: TSH: 2.67 u[IU]/mL (ref 0.350–4.500)

## 2012-05-11 LAB — LIPID PANEL
HDL: 59 mg/dL (ref >39)
LDL Cholesterol: 97 mg/dL (ref 0–99)
Triglycerides: 119 mg/dL (ref <150)

## 2012-05-11 NOTE — Progress Notes (Signed)
[name],[name]    05/11/2012  ID: [name]   DOB: 19-Oct-1950  MR#: [id number]  CSN#:[id number]   PCP: Elijio Miles, MD SU: Cicero Duck GYN: Annia Friendly OTHER MD: Hal Neer  HISTORY OF PRESENT ILLNESS: The patient had routine screening mammography at [facility], suggesting a potential abnormality in the left breast. Additional views 08/29/2011 confirmed an irregular mass with architectural or distortion measuring up to 4 cm in the left breast, without associated microcalcifications. By ultrasound this was irregular, and showed posterior shadowing. It measured 3.6 cm sonographically. There were no suspicious lymph nodes noted in the left axilla.  The same day the patient underwent ultrasound-guided biopsy of the left breast mass, showing ([id number]) and invasive ductal carcinoma, grade 1, which was 100% estrogen receptor and 97% progesterone receptor positive. The proliferation marker was 20%. There was no HER-2 amplification with a ratio by CISH of 1.09.  The patient had bilateral breast MRI 09/03/2011 showing a solitary 3.8 cm left breast lesion, and on 11/04/2011 underwent left lumpectomy and sentinel lymph node sampling with results and subsequent history as detailed below.  INTERVAL HISTORY: [name] returns today with her husband [name] for followup of her breast cancer. Since her last visit here, she started tamoxifen (July 2013) she is doing terrific with that medication. She's also continue to exercise regularly, mostly through [place]. Her fatigue has resolved.  REVIEW OF SYSTEMS:  She has minimal hot flashes, which tend to be associated with stress. She has very minimal vaginal wetness, no itching or smell. A detailed review of systems today was otherwise entirely negative.  PAST MEDICAL HISTORY: Past Medical History  Diagnosis Date  . Hyperlipidemia   . Breastr cancer, IDC, Left UOQ, clinical stage II Receptor +, Her 2 - 08/29/2011  .  Hypothyroidism   . Osteoarthritis     PAST SURGICAL HISTORY: Past Surgical History  Procedure Date  . Ectopic pregnancy surgery 1986-88    s/p unilateral salpingectomy  . Bunionectomy 2000    Left  . Wisdom tooth extraction   . Breast lumpectomy 11/04/11    FAMILY HISTORY Family History  Problem Relation Age of Onset  . Heart disease Mother   . Heart disease Father   . Cancer Maternal Aunt     ovarian  . Heart disease Paternal Uncle    the patient's father died at the age of 37 from myocardial infarction. The patient's mother died from congestive heart failure at the age of 19. The patient has one brother age 29 with prostate cancer diagnosed at age 39. The patient's sister is 33 as of 2013. There is no breast or ovarian cancer in the family  GYNECOLOGIC HISTORY: She had menarche at around age 21. She is GX P1, first pregnancy to term age 37. She stopped having periods about 2003. 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 Her husband [name]  has had prostate cancer, status post radiation treatments in [place] about 2 years ago. Her daughter [name] lives in [place]. She has 2 children. 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 The patient attends the [employer].   ADVANCED DIRECTIVES: in place  HEALTH MAINTENANCE: History  Substance Use Topics  . Smoking status: Never Smoker   . Smokeless tobacco: Never Used  . Alcohol Use: Yes  Colonoscopy: repeat due 2015  PAP: UTD (Richard Carlis Abbott)  Bone density: remote  Lipid panel: under treatment  Allergies  Allergen Reactions  . Oxycodone Anxiety  . Epinephrine     Sever headaches  . Other     Hospital gown  . Iodine Rash  . Nickel Rash    Rash and blisters  . Penicillins Rash    Rash and blisters  . Sulfa Drugs  Cross Reactors Rash    Current Outpatient Prescriptions  Medication Sig Dispense Refill  . atorvastatin (LIPITOR) 10 MG tablet Take 10 mg by mouth daily.      . B Complex-[name] (SUPER B COMPLEX PO) Take by mouth.      [name] CALCIUM PO Take 700 mg by mouth daily.      . Cholecalciferol (VITAMIN D-3 PO) Take 2,000 Units by mouth daily.      . Coenzyme Q10 (COQ10) 100 MG CAPS Take by mouth.      . emollient (BIAFINE) cream Apply topically as needed.      [name] levothyroxine (SYNTHROID, LEVOTHROID) 75 MCG tablet Take 75 mcg by mouth every other day.      . levothyroxine (SYNTHROID, LEVOTHROID) 88 MCG tablet Take 88 mcg by mouth every other day.      . Magnesium 400 MG CAPS Take by mouth daily.      . mometasone (NASONEX) 50 MCG/ACT nasal spray Place 2 sprays into the nose daily.      . Multiple Vitamin (MULTIVITAMIN) capsule Take 1 capsule by mouth daily.      . NON FORMULARY Mangosteen 3 oz QD      . non-metallic deodorant (ALRA) MISC Apply 1 application topically daily as needed.      . vitamin E (VITAMIN E) 400 UNIT capsule Take 400 Units by mouth daily.      . Wound Cleansers (RADIAPLEX EX) Apply topically.        OBJECTIVE: Middle-aged white woman who appears well Filed Vitals:   05/11/12 0946  BP: 146/82  Pulse: 73  Temp: 98.2 F (36.8 [name])  Resp: 20     Body mass index is 24.60 kg/(m^2).    ECOG FS: 0  Sclerae unicteric Oropharynx clear No peripheral adenopathy  Lungs no rales or rhonchi Heart regular rate and rhythm Abd benign MSK no focal spinal tenderness, specifically no peripheral edema LUE Neuro: nonfocal Breasts: The right breast is unremarkable. The left breast is status post lumpectomy and radiation. The area immediately under the left axillary scar is somewhat indurated, without erythema or tenderness. There is no evidence of local recurrence; the left axilla is benign  LAB RESULTS: Lab Results  Component Value Date   WBC 4.6 05/11/2012   NEUTROABS 3.2 05/11/2012   HGB  12.5 05/11/2012   HCT 35.8 05/11/2012   MCV 91.6 05/11/2012   PLT 155 05/11/2012      Chemistry      Component Value Date/Time   NA 137 01/22/2012 1106   K 4.3 01/22/2012 1106   CL 104 01/22/2012 1106   CO2 27 01/22/2012 1106   BUN 16 01/22/2012 1106   CREATININE 1.15* 01/22/2012 1106      Component Value Date/Time   CALCIUM 9.8 01/22/2012 1106   ALKPHOS 61 01/22/2012 1106   AST 25 01/22/2012 1106   ALT 18 01/22/2012 1106   BILITOT 0.6 01/22/2012 1106       Lab Results  Component Value Date   LABCA2 15 10/02/2011    No components  found with this basename: [id number]    No results found for this basename: INR:1;PROTIME:1 in the last 168 hours  Urinalysis    Component Value Date/Time   COLORURINE YELLOW 10/31/2011 0859    STUDIES: Bone density at the breast Center June 2013 showed a normal T-spine and mild osteopenia at the left femoral neck, with a T. score of -1.4.   ASSESSMENT: 61 y.o.  Pura Spice woman status post left lumpectomy 11/04/2011 for a T2 N0, stage IIA invasive ductal carcinoma, grade 2, strongly estrogen and progesterone receptor positive, HER-2 negative, with an MIB-1 of 20%. She completed radiation treatments June 2013 and started tamoxifen mid-July 2013  PLAN: Chloeann is doing terrific with the tamoxifen, and she is interested in the "10 year plan", which means likely we will do tamoxifen for 5 years then decide whether to continue another 5 years or switch to an aromatase inhibitor.  She will see her gynecologist in 3 months. She will see [place] in 6 months, but if she does see Dr. Jamey Ripa in March as she tells me has been scheduled then we can move the April appointment back to June. Once she has the 2 year mark we will start seeing her on a once a year basis. She knows to call for any problems that may develop before the next visit.   [name],[name]    05/11/2012

## 2012-05-11 NOTE — Telephone Encounter (Signed)
Gave patient appointment for 10-26-2012 at 8:00am lab only  Gave patient appointment for 11-03-2011 at 1:15pm midlevel

## 2012-09-07 ENCOUNTER — Other Ambulatory Visit: Payer: Self-pay | Admitting: Physician Assistant

## 2012-09-07 DIAGNOSIS — C50419 Malignant neoplasm of upper-outer quadrant of unspecified female breast: Secondary | ICD-10-CM

## 2012-09-09 ENCOUNTER — Telehealth: Payer: Self-pay | Admitting: Oncology

## 2012-09-09 NOTE — Telephone Encounter (Signed)
left message..gv appt d/t.Marland Kitchenasked for pt to call in confirm...td

## 2012-09-09 NOTE — Telephone Encounter (Signed)
will mail letter and cal

## 2012-09-16 ENCOUNTER — Telehealth: Payer: Self-pay | Admitting: Oncology

## 2012-09-16 NOTE — Telephone Encounter (Signed)
appts for 4/21 moved to 6/2 due to AB out of office. Called pt re message I received from [place] that pt would like to have lbs drawn before 6/2 f/u. S/w pt re lab for 5/28 @ 1pm and f/u 6/2 @ 1:15pm. Per pt she has not had her mammo yet and she was of the understanding that GM would determine when her next mammo would be. Per pt she wants to know if she should wait until 6/2 about this or have her mammo in April. Pt informed that message re this would need to be sent to desk nurse. Lm for desk nurse re above.

## 2012-10-21 ENCOUNTER — Telehealth (INDEPENDENT_AMBULATORY_CARE_PROVIDER_SITE_OTHER): Payer: Self-pay | Admitting: General Surgery

## 2012-10-21 ENCOUNTER — Encounter (INDEPENDENT_AMBULATORY_CARE_PROVIDER_SITE_OTHER): Payer: Self-pay | Admitting: Surgery

## 2012-10-21 ENCOUNTER — Ambulatory Visit (INDEPENDENT_AMBULATORY_CARE_PROVIDER_SITE_OTHER): Payer: No Typology Code available for payment source | Admitting: Surgery

## 2012-10-21 VITALS — BP 114/82 | HR 73 | Temp 98.1°F | Resp 16 | Ht 65.0 in | Wt 148.6 lb

## 2012-10-21 DIAGNOSIS — Z853 Personal history of malignant neoplasm of breast: Secondary | ICD-10-CM

## 2012-10-21 NOTE — Telephone Encounter (Signed)
Spoke with patient she is aware of  appt  11/03/12 at  [facility] . She was advised to contact the place of here last mammogram and request cd films and reports to be sent to [facility]

## 2012-10-21 NOTE — Progress Notes (Signed)
NAME: [name]       DOB: 07-15-1951           DATE: 10/21/2012       MRN: [id number]  CC:   Chief Complaint  Patient presents with  . Breast Cancer Long Term Follow Up    [name] is a 62 y.o.Marland Kitchenfemale who presents for routine followup of her Stage IIA IDC, Left brest diagnosed in 2013 and treated with Lumpectomy, . She has no problems or concerns on either side.  PFSH: She has had no significant changes since the last visit here.  ROS: There have been no significant changes since the last visit here  EXAM:  VS: BP 114/82  Pulse 73  Temp(Src) 98.1 F (36.7 C) (Temporal)  Resp 16  Ht 5\' 5"  (1.651 m)  Wt 148 lb 9.6 oz (67.405 kg)  BMI 24.73 kg/m2  General: The patient is alert, oriented, generally healthy appearing, NAD. Mood and affect are normal.  Breasts:  the right breast is completely normal. The left breast has some mild edema from the surgery radiation. There is no palpable mass. There is no suggestion of a recurrence. There is some slight thickening at the lumpectomy site.  Lymphatics: She has no axillary or supraclavicular adenopathy on either side.  Extremities: Full ROM of the surgical side with no lymphedema noted.  Data Reviewed: No new data. Mammogram has not yet been ordered  Impression: Doing well, with no evidence of recurrent cancer or new cancer  Plan: Will continue to follow up in 6 months

## 2012-10-21 NOTE — Addendum Note (Signed)
Addended by: Milas Hock on: 10/21/2012 12:45 PM   Modules accepted: Orders

## 2012-10-21 NOTE — Patient Instructions (Signed)
We will put the order in for you to get your mammogram this year. I will plan to see you in 6 months.

## 2012-10-26 ENCOUNTER — Other Ambulatory Visit: Payer: No Typology Code available for payment source | Admitting: Lab

## 2012-10-30 ENCOUNTER — Telehealth: Payer: Self-pay | Admitting: *Deleted

## 2012-10-30 NOTE — Telephone Encounter (Signed)
I called patient to introduce myself as the [facility] Navigator and to review her upcoming appointments.  She reports that she is doing well and does not have any questions or concerns at this time.  I encouraged her to call with any questions or concerns.

## 2012-11-02 ENCOUNTER — Ambulatory Visit: Payer: No Typology Code available for payment source | Admitting: Physician Assistant

## 2012-11-03 ENCOUNTER — Ambulatory Visit
Admission: RE | Admit: 2012-11-03 | Discharge: 2012-11-03 | Disposition: A | Discharge status: Home / Self Care | Payer: No Typology Code available for payment source | Source: Ambulatory Visit | Attending: Surgery | Admitting: Surgery

## 2012-11-03 DIAGNOSIS — Z853 Personal history of malignant neoplasm of breast: Secondary | ICD-10-CM

## 2012-12-09 ENCOUNTER — Telehealth: Payer: Self-pay | Admitting: *Deleted

## 2012-12-09 ENCOUNTER — Other Ambulatory Visit: Payer: Self-pay | Admitting: Physician Assistant

## 2012-12-09 ENCOUNTER — Other Ambulatory Visit (HOSPITAL_BASED_OUTPATIENT_CLINIC_OR_DEPARTMENT_OTHER): Payer: No Typology Code available for payment source

## 2012-12-09 DIAGNOSIS — R7989 Other specified abnormal findings of blood chemistry: Secondary | ICD-10-CM

## 2012-12-09 DIAGNOSIS — C50419 Malignant neoplasm of upper-outer quadrant of unspecified female breast: Secondary | ICD-10-CM

## 2012-12-09 LAB — COMPREHENSIVE METABOLIC PANEL (CC13)
ALT: 53 U/L (ref 0–55)
AST: 48 U/L — ABNORMAL HIGH (ref 5–34)
Alkaline Phosphatase: 46 U/L (ref 40–150)
Creatinine: 1.1 mg/dL (ref 0.6–1.1)
Total Bilirubin: 0.5 mg/dL (ref 0.20–1.20)

## 2012-12-09 LAB — CBC WITH DIFFERENTIAL/PLATELET
BASO%: 0.6 % (ref 0.0–2.0)
EOS%: 1 % (ref 0.0–7.0)
HCT: 37.4 % (ref 34.8–46.6)
LYMPH%: 14.6 % (ref 14.0–49.7)
MCH: 30.5 pg (ref 25.1–34.0)
MCHC: 33.7 g/dL (ref 31.5–36.0)
NEUT%: 78.6 % — ABNORMAL HIGH (ref 38.4–76.8)
Platelets: 163 10*3/uL (ref 145–400)
lymph#: 0.9 10*3/uL (ref 0.9–3.3)

## 2012-12-09 NOTE — Telephone Encounter (Signed)
sw pt gv lab appt for 12/14/12 @1pm .the patient is aware.Marland Kitchentd

## 2012-12-11 ENCOUNTER — Telehealth: Payer: Self-pay | Admitting: *Deleted

## 2012-12-11 NOTE — Telephone Encounter (Signed)
I called patient to remind her of her appointment with Zollie Scale on 12/14/12.  Message and contact information left on home phone and cell phone voice mail.

## 2012-12-14 ENCOUNTER — Ambulatory Visit (HOSPITAL_BASED_OUTPATIENT_CLINIC_OR_DEPARTMENT_OTHER): Payer: No Typology Code available for payment source | Admitting: Physician Assistant

## 2012-12-14 ENCOUNTER — Other Ambulatory Visit (HOSPITAL_BASED_OUTPATIENT_CLINIC_OR_DEPARTMENT_OTHER): Payer: No Typology Code available for payment source | Admitting: Lab

## 2012-12-14 ENCOUNTER — Telehealth: Payer: Self-pay | Admitting: Oncology

## 2012-12-14 ENCOUNTER — Encounter: Payer: Self-pay | Admitting: *Deleted

## 2012-12-14 ENCOUNTER — Other Ambulatory Visit: Payer: Self-pay | Admitting: Lab

## 2012-12-14 ENCOUNTER — Encounter: Payer: Self-pay | Admitting: Physician Assistant

## 2012-12-14 VITALS — BP 115/72 | HR 71 | Temp 98.1°F | Resp 20 | Ht 65.0 in | Wt 147.7 lb

## 2012-12-14 DIAGNOSIS — C50419 Malignant neoplasm of upper-outer quadrant of unspecified female breast: Secondary | ICD-10-CM

## 2012-12-14 DIAGNOSIS — R7989 Other specified abnormal findings of blood chemistry: Secondary | ICD-10-CM

## 2012-12-14 DIAGNOSIS — R922 Inconclusive mammogram: Secondary | ICD-10-CM | POA: Insufficient documentation

## 2012-12-14 DIAGNOSIS — Z853 Personal history of malignant neoplasm of breast: Secondary | ICD-10-CM

## 2012-12-14 DIAGNOSIS — C50412 Malignant neoplasm of upper-outer quadrant of left female breast: Secondary | ICD-10-CM

## 2012-12-14 LAB — LIPID PANEL
Total CHOL/HDL Ratio: 2.9 Ratio
VLDL: 43 mg/dL — ABNORMAL HIGH (ref 0–40)

## 2012-12-14 LAB — COMPREHENSIVE METABOLIC PANEL (CC13)
AST: 46 U/L — ABNORMAL HIGH (ref 5–34)
Albumin: 3.4 g/dL — ABNORMAL LOW (ref 3.5–5.0)
Alkaline Phosphatase: 44 U/L (ref 40–150)
BUN: 14.6 mg/dL (ref 7.0–26.0)
Potassium: 3.9 mEq/L (ref 3.5–5.1)
Sodium: 142 mEq/L (ref 136–145)
Total Protein: 6.5 g/dL (ref 6.4–8.3)

## 2012-12-14 MED ORDER — TAMOXIFEN CITRATE 20 MG PO TABS: 20.0000 mg | ORAL_TABLET | Freq: Every day | ORAL | Status: DC

## 2012-12-14 NOTE — Progress Notes (Signed)
Caffie Sotto    12/14/2012  ID: [name]   DOB: [birth year]  MR#: [id number]  WGN#:[id number]   PCP: Elijio Miles, MD SU: Cicero Duck GYN: Annia Friendly OTHER MD: Hal Neer  HISTORY OF PRESENT ILLNESS: The patient had routine screening mammography at [facility], suggesting a potential abnormality in the left breast. Additional views 08/29/2011 confirmed an irregular mass with architectural or distortion measuring up to 4 cm in the left breast, without associated microcalcifications. By ultrasound this was irregular, and showed posterior shadowing. It measured 3.6 cm sonographically. There were no suspicious lymph nodes noted in the left axilla.  The same day the patient underwent ultrasound-guided biopsy of the left breast mass, showing ([id number]) and invasive ductal carcinoma, grade 1, which was 100% estrogen receptor and 97% progesterone receptor positive. The proliferation marker was 20%. There was no HER-2 amplification with a ratio by CISH of 1.09.  The patient had bilateral breast MRI 09/03/2011 showing a solitary 3.8 cm left breast lesion, and on 11/04/2011 underwent left lumpectomy and sentinel lymph node sampling with results and subsequent history as detailed below.   INTERVAL HISTORY: Katyana returns today  for followup of her left breast cancer. She has been on tamoxifen since July 2013, and is tolerating it extremely well. In fact she tells me she is "very boring" and has no complaints. Her energy level is great. She has no significant hot flashes, although when she is under quite a bit of stress. She's had no abnormal bleeding and denies any abnormal clotting. She is due for routine GYN exam later this week. She's exercising on a regular basis.  REVIEW OF SYSTEMS:  Elowyn denies any recent illnesses and has had no fevers or chills. She's eating and drinking well no nausea or change in bowel habits. No cough, shortness of breath, or chest pain. No  abnormal headaches or dizziness. No myalgias, arthralgias, bony pain, or peripheral swelling.  A detailed review of systems is otherwise noncontributory.   PAST MEDICAL HISTORY: Past Medical History  Diagnosis Date  . Hyperlipidemia   . Breastr cancer, IDC, Left UOQ, clinical stage II Receptor +, Her 2 - 08/29/2011  . Hypothyroidism   . Osteoarthritis     PAST SURGICAL HISTORY: Past Surgical History  Procedure Laterality Date  . Ectopic pregnancy surgery  1986-88    s/p unilateral salpingectomy  . Bunionectomy  2000    Left  . Wisdom tooth extraction    . Breast lumpectomy  11/04/11    FAMILY HISTORY Family History  Problem Relation Age of Onset  . Heart disease Mother   . Heart disease Father   . Cancer Maternal Aunt     ovarian  . Heart disease Paternal Uncle    the patient's father died at the age of 40 from myocardial infarction. The patient's mother died from congestive heart failure at the age of 5. The patient has one brother age 71 with prostate cancer diagnosed at age 103. The patient's sister is 65 as of 2013. There is no breast or ovarian cancer in the family  GYNECOLOGIC HISTORY: She had menarche at around age 60. She is GX P1, first pregnancy to term age 33. She stopped having periods about 2003. 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 Her husband [name]  has had  prostate cancer, status post radiation treatments in [place] about 2 years ago. Her daughter [name] lives in [place]. She has 2 children. 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 The patient attends the [employer].   ADVANCED DIRECTIVES: in place  HEALTH MAINTENANCE: History  Substance Use Topics  . Smoking status: Never Smoker   . Smokeless tobacco: Never Used  . Alcohol Use: Yes      Colonoscopy: repeat due 2015  PAP: UTD (Richard Carlis Abbott)  Bone density: remote  Lipid panel: under treatment  Allergies  Allergen Reactions  . Oxycodone Anxiety  . Epinephrine     Sever headaches  . Other     Hospital gown  . Iodine Rash  . Nickel Rash    Rash and blisters  . Penicillins Rash    Rash and blisters  . Sulfa Drugs Cross Reactors Rash    Current Outpatient Prescriptions  Medication Sig Dispense Refill  . atorvastatin (LIPITOR) 10 MG tablet Take 10 mg by mouth daily.      . B Complex-C (SUPER B COMPLEX PO) Take by mouth.      [name] CALCIUM PO Take 700 mg by mouth daily.      . Cholecalciferol (VITAMIN D-3 PO) Take 2,000 Units by mouth daily.      . Coenzyme Q10 (COQ10) 100 MG CAPS Take by mouth.      . emollient (BIAFINE) cream Apply topically as needed.      [name] levothyroxine (SYNTHROID, LEVOTHROID) 75 MCG tablet Take 75 mcg by mouth every other day.      . levothyroxine (SYNTHROID, LEVOTHROID) 88 MCG tablet Take 88 mcg by mouth every other day.      . Magnesium 400 MG CAPS Take by mouth daily.      . mometasone (NASONEX) 50 MCG/ACT nasal spray Place 2 sprays into the nose daily.      . Multiple Vitamin (MULTIVITAMIN) capsule Take 1 capsule by mouth daily.      . nitrofurantoin, macrocrystal-monohydrate, (MACROBID) 100 MG capsule       . NON FORMULARY Mangosteen 3 oz QD      . non-metallic deodorant (ALRA) MISC Apply 1 application topically daily as needed.      . tamoxifen (NOLVADEX) 20 MG tablet Take 1 tablet (20 mg total) by mouth daily.  90 tablet  3  . vitamin E (VITAMIN E) 400 UNIT capsule Take 400 Units by mouth daily.      . Wound Cleansers (RADIAPLEX EX) Apply topically.       No current facility-administered medications for this visit.    OBJECTIVE: Middle-aged white woman who appears well Filed Vitals:   12/14/12 1306  BP: 115/72  Pulse: 71  Temp: 98.1 F (36.7 C)  Resp: 20     Body mass index is 24.58 kg/(m^2).    ECOG FS: 0 Filed  Weights   12/14/12 1306  Weight: 147 lb 11.2 oz (66.996 kg)   Sclerae unicteric Oropharynx clear No cervical or supraclavicular lymphadenopathy  Lungs clear to all sedation bilaterally, no rales or rhonchi Heart regular rate and rhythm Abdomen soft, nontender palpation, positive bowel sounds MSK no focal spinal tenderness No peripheral edema Neuro: nonfocal, well oriented with positive affect Breasts: The right breast is unremarkable. The left breast is status post lumpectomy and radiation. There is palpable scar tissue, and both breasts are dense, but there is no evidence of local recurrence. Axillae are benign bilaterally, no adenopathy palpated.  LAB RESULTS: Lab  Results  Component Value Date   WBC 6.3 12/09/2012   NEUTROABS 4.9 12/09/2012   HGB 12.6 12/09/2012   HCT 37.4 12/09/2012   MCV 90.3 12/09/2012   PLT 163 12/09/2012      Chemistry      Component Value Date/Time   NA 142 12/09/2012 1301   NA 137 01/22/2012 1106   K 3.6 12/09/2012 1301   K 4.3 01/22/2012 1106   CL 108* 12/09/2012 1301   CL 104 01/22/2012 1106   CO2 24 12/09/2012 1301   CO2 27 01/22/2012 1106   BUN 14.3 12/09/2012 1301   BUN 16 01/22/2012 1106   CREATININE 1.1 12/09/2012 1301   CREATININE 1.15* 01/22/2012 1106      Component Value Date/Time   CALCIUM 9.1 12/09/2012 1301   CALCIUM 9.8 01/22/2012 1106   ALKPHOS 46 12/09/2012 1301   ALKPHOS 61 01/22/2012 1106   AST 48* 12/09/2012 1301   AST 25 01/22/2012 1106   ALT 53 12/09/2012 1301   ALT 18 01/22/2012 1106   BILITOT 0.50 12/09/2012 1301   BILITOT 0.6 01/22/2012 1106       STUDIES: Bone density at the breast Center June 2013 showed a normal T-spine and mild osteopenia at the left femoral neck, with a T. score of -1.4.   11/03/2012 *RADIOLOGY REPORT*  Clinical Data: Personal history of left breast cancer status post  lumpectomy 2013  DIGITAL DIAGNOSTIC BILATERAL MAMMOGRAM WITH CAD  Comparison: August 29, 2011, August 27 2011, June 28, 2010   Findings:  ACR Breast Density Category heterogeneously dense  CC and MLO views of bilateral breasts, spot tangential view of left  breast, spot compression left CC view are submitted. Postsurgical  changes are identified within the left breast. There is questioned  asymmetry in the medial left breast which does not persist on spot  compression left CC view. There is stable asymmetry in the lateral  right breast unchanged.  Mammographic images were processed with CAD.  IMPRESSION:  Benign findings.  RECOMMENDATION:  Bilateral diagnostic mammogram in 1 year.  I have discussed the findings and recommendations with the patient.  Results were also provided in writing at the conclusion of the  visit. If applicable, a reminder letter will be sent to the  patient regarding her next appointment.  BI-RADS CATEGORY 2: Benign finding(s).  Original Report Authenticated By: Sherian Rein, M.D.     ASSESSMENT: 62 y.o.  Pura Spice woman   (1)  status post left lumpectomy 11/04/2011 for a T2 N0, stage IIA invasive ductal carcinoma, grade 2, strongly estrogen and progesterone receptor positive, HER-2 negative, with an MIB-1 of 20%.   (2)  She completed radiation treatments June 2013    (3)  started tamoxifen mid-July 2013    PLAN:  Tanijah will continue on the tamoxifen which she is tolerating very well, and the plan is to continue for total 5 years, then decide whether to continue for an additional 5 years, or switch to an aromatase inhibitor.  Giara is already scheduled to see her gynecologist later this week, and will be seeing her primary care physician, Dr. Alben Spittle, in the next month. She'll ask him to recheck her metabolic panel to follow the slight elevation in liver enzymes. This could very likely be secondary to her Lipitor.   Jakaylee will return to see [place] for routine followup in 6 months. Her next mammogram, along with a breast MRI, is due next April. Once she has the 2 year mark we will  start seeing her on a once a year basis. She knows to call for any problems that may develop before the next visit.   Cassundra Mckeever    12/14/2012

## 2012-12-14 NOTE — Telephone Encounter (Signed)
gv pt appt schedule for December 2014 and mammo for 11/04/2013. Pt aware central will contact her re mri appt for April 2015.

## 2012-12-14 NOTE — Progress Notes (Signed)
Patient at [facility] for f/u appointment with Zollie Scale, NP.  I met with patient for a face to face introduction as the [employer].  Patient reports that she is doing very well.  She reports that she has no questions or concerns.  I gave her my buisness card with contact information and instructed her to call me for any needs.  She verbalized understanding.

## 2013-02-04 ENCOUNTER — Encounter: Payer: Self-pay | Admitting: *Deleted

## 2013-02-04 NOTE — Progress Notes (Signed)
RECEIVED A PRESCRIPTION REQUEST FOR 90 DAY SUPPLY FOR TAMOXIFEN FROM [facility]. THE 12/14/12 PRESCRIPTION WAS VERBALLY GIVEN TO [facility].

## 2013-04-20 ENCOUNTER — Encounter (INDEPENDENT_AMBULATORY_CARE_PROVIDER_SITE_OTHER): Payer: Self-pay | Admitting: Surgery

## 2013-04-20 ENCOUNTER — Ambulatory Visit (INDEPENDENT_AMBULATORY_CARE_PROVIDER_SITE_OTHER): Payer: 59 | Admitting: Surgery

## 2013-04-20 VITALS — BP 118/68 | HR 76 | Temp 97.1°F | Resp 16 | Ht 65.0 in | Wt 148.0 lb

## 2013-04-20 DIAGNOSIS — Z853 Personal history of malignant neoplasm of breast: Secondary | ICD-10-CM

## 2013-04-20 NOTE — Progress Notes (Signed)
NAME: [name]       DOB: Nov 12, 1950           DATE: 04/20/2013       MRN: [id number]  CC:   Chief Complaint  Patient presents with  . Routine Post Op    reck br    [name] is a 62 y.o.Marland Kitchenfemale who presents for routine followup of her Stage IIA IDC, receptor +, Her 2 negLeft breast diagnosed in 2013 and treated with Lumpectomy, SLN, radiation, . She has no problems or concerns on either side.  PFSH: She has had no significant changes since the last visit here.  ROS: There have been no significant changes since the last visit here  EXAM:  VS: BP 118/68  Pulse 76  Temp(Src) 97.1 F (36.2 C) (Temporal)  Resp 16  Ht 5\' 5"  (1.651 m)  Wt 148 lb (67.132 kg)  BMI 24.63 kg/m2  General: The patient is alert, oriented, generally healthy appearing, NAD. Mood and affect are normal.  Breasts:  the right breast is completely normal. The left breast has some mild edema from the surgery radiation. There is no palpable mass. There is no suggestion of a recurrence. There is some slight thickening at the lumpectomy site.  Lymphatics: She has no axillary or supraclavicular adenopathy on either side.  Extremities: Full ROM of the surgical side with no lymphedema noted.  Data Reviewed: Mammogram in April: Comparison: August 29, 2011, August 27 2011, June 28, 2010  Findings:  ACR Breast Density Category heterogeneously dense  CC and MLO views of bilateral breasts, spot tangential view of left  breast, spot compression left CC view are submitted. Postsurgical  changes are identified within the left breast. There is questioned  asymmetry in the medial left breast which does not persist on spot  compression left CC view. There is stable asymmetry in the lateral  right breast unchanged.  Mammographic images were processed with CAD.  IMPRESSION:  Benign findings.  RECOMMENDATION:  Bilateral diagnostic mammogram in 1 year.  I have discussed the findings and  recommendations with the patient.  Results were also provided in writing at the conclusion of the  visit. If applicable, a reminder letter will be sent to the  patient regarding her next appointment.  BI-RADS CATEGORY 2: Benign finding(s).  Original Report Authenticated By: Sherian Rein, M.D.   Impression: Doing well, with no evidence of recurrent cancer or new cancer  Plan: Will work with Dr Darnelle Catalan for f/u planning

## 2013-04-20 NOTE — Patient Instructions (Signed)
Continue annual mamograms

## 2013-06-16 ENCOUNTER — Encounter (INDEPENDENT_AMBULATORY_CARE_PROVIDER_SITE_OTHER): Payer: Self-pay

## 2013-06-16 ENCOUNTER — Other Ambulatory Visit (HOSPITAL_BASED_OUTPATIENT_CLINIC_OR_DEPARTMENT_OTHER): Payer: 59 | Admitting: Lab

## 2013-06-16 DIAGNOSIS — C50419 Malignant neoplasm of upper-outer quadrant of unspecified female breast: Secondary | ICD-10-CM

## 2013-06-16 DIAGNOSIS — C50412 Malignant neoplasm of upper-outer quadrant of left female breast: Secondary | ICD-10-CM

## 2013-06-16 LAB — CBC WITH DIFFERENTIAL/PLATELET
BASO%: 0.6 % (ref 0.0–2.0)
Eosinophils Absolute: 0.1 10*3/uL (ref 0.0–0.5)
HCT: 40 % (ref 34.8–46.6)
MCHC: 32.7 g/dL (ref 31.5–36.0)
MONO#: 0.7 10*3/uL (ref 0.1–0.9)
NEUT#: 7.1 10*3/uL — ABNORMAL HIGH (ref 1.5–6.5)
NEUT%: 78.5 % — ABNORMAL HIGH (ref 38.4–76.8)
Platelets: 181 10*3/uL (ref 145–400)
RBC: 4.29 10*6/uL (ref 3.70–5.45)
WBC: 9 10*3/uL (ref 3.9–10.3)
lymph#: 1.1 10*3/uL (ref 0.9–3.3)

## 2013-06-16 LAB — COMPREHENSIVE METABOLIC PANEL (CC13)
ALT: 28 U/L (ref 0–55)
Anion Gap: 9 mEq/L (ref 3–11)
CO2: 25 mEq/L (ref 22–29)
Calcium: 9.4 mg/dL (ref 8.4–10.4)
Chloride: 107 mEq/L (ref 98–109)
Glucose: 90 mg/dl (ref 70–140)
Sodium: 140 mEq/L (ref 136–145)
Total Protein: 7.1 g/dL (ref 6.4–8.3)

## 2013-06-21 ENCOUNTER — Ambulatory Visit (HOSPITAL_BASED_OUTPATIENT_CLINIC_OR_DEPARTMENT_OTHER): Payer: 59 | Admitting: Oncology

## 2013-06-21 ENCOUNTER — Telehealth: Payer: Self-pay | Admitting: *Deleted

## 2013-06-21 VITALS — BP 131/81 | HR 71 | Temp 97.9°F | Resp 18 | Ht 65.0 in | Wt 146.0 lb

## 2013-06-21 DIAGNOSIS — Z170 Estrogen receptor positive status [ER+]: Secondary | ICD-10-CM | POA: Insufficient documentation

## 2013-06-21 DIAGNOSIS — C50419 Malignant neoplasm of upper-outer quadrant of unspecified female breast: Secondary | ICD-10-CM

## 2013-06-21 DIAGNOSIS — C50412 Malignant neoplasm of upper-outer quadrant of left female breast: Secondary | ICD-10-CM

## 2013-06-21 DIAGNOSIS — M899 Disorder of bone, unspecified: Secondary | ICD-10-CM

## 2013-06-21 MED ORDER — TAMOXIFEN CITRATE 20 MG PO TABS: 20.0000 mg | ORAL_TABLET | Freq: Every day | ORAL | Status: DC

## 2013-06-21 NOTE — Progress Notes (Signed)
[name],[name]    06/21/2013  ID: [name]   DOB: 1950/11/12  MR#: [id number]  WUJ#:[id number]   PCP: Jennifer Miles, MD SU: Cicero Duck GYN: [name] OTHER MD: [name]  CC: "I'm doing fine"  HISTORY OF PRESENT ILLNESS: The patient had routine screening mammography at [facility] 2013, suggesting a potential abnormality in the left breast. Additional views 08/29/2011 confirmed an irregular mass with architectural or distortion measuring up to 4 cm in the left breast, without associated microcalcifications. By ultrasound this was irregular, and showed posterior shadowing. It measured 3.6 cm sonographically. There were no suspicious lymph nodes noted in the left axilla.  The same day the patient underwent ultrasound-guided biopsy of the left breast mass, showing ([id number]) and invasive ductal carcinoma, grade 1, which was 100% estrogen receptor and 97% progesterone receptor positive. The proliferation marker was 20%. There was no HER-2 amplification with a ratio by CISH of 1.09.  The patient had bilateral breast MRI 09/03/2011 showing a solitary 3.8 cm left breast lesion, and on 11/04/2011 underwent left lumpectomy and sentinel lymph node sampling with results and subsequent history as detailed below.   INTERVAL HISTORY: [name]  for followup of her left breast cancer. The interval history is unremarkable. She is working very hard. She still manages to get to the gym about 3 times a week. Family is doing well and she is looking forward to the holidays  REVIEW OF SYSTEMS:  [name] is tolerating tamoxifen without significant side effects. Hot flashes are mild. Vaginal dryness is not an issue. A detailed review of systems [name] was significant only for mild sinus symptoms which some mornings closer to feel slightly dizzy. This is very mild and subtle and very infrequent. A detailed review of systems [name] was otherwise noncontributory  PAST MEDICAL  HISTORY: Past Medical History  Diagnosis Date  . Hyperlipidemia   . Breastr cancer, IDC, Left UOQ, clinical stage II Receptor +, Her 2 - 08/29/2011  . Hypothyroidism   . Osteoarthritis     PAST SURGICAL HISTORY: Past Surgical History  Procedure Laterality Date  . Ectopic pregnancy surgery  1986-88    s/p unilateral salpingectomy  . Bunionectomy  2000    Left  . Wisdom tooth extraction    . Breast lumpectomy  11/04/11    FAMILY HISTORY Family History  Problem Relation Age of Onset  . Heart disease Mother   . Heart disease Father   . Cancer Maternal Aunt     ovarian  . Heart disease Paternal Uncle    the patient's father died at the age of 54 from myocardial infarction. The patient's mother died from congestive heart failure at the age of 92. The patient has one brother age 81 with prostate cancer diagnosed at age 32. The patient's sister is 72 as of 2013. There is no breast or ovarian cancer in the family  GYNECOLOGIC HISTORY: She had menarche at around age 31. She is GX P1, first pregnancy to term age 33. She stopped having periods about 2003. 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 [name]. 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 Her husband [name]  has had prostate cancer, status post radiation treatments in [place] about 2 years ago. Her daughter [name] lives in [place]. She has 2 children. 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 The  patient attends the [employer].   ADVANCED DIRECTIVES: in place  HEALTH MAINTENANCE: History  Substance Use Topics  . Smoking status: Never Smoker   . Smokeless tobacco: Never Used  . Alcohol Use: Yes     Colonoscopy: repeat due 2015  PAP: UTD ([name])  Bone density: remote  Lipid panel: under treatment  Allergies  Allergen Reactions  . Oxycodone Anxiety  .  Epinephrine     Sever headaches  . Other     Hospital gown  . Iodine Rash  . Nickel Rash    Rash and blisters  . Penicillins Rash    Rash and blisters  . Sulfa Drugs Cross Reactors Rash    Current Outpatient Prescriptions  Medication Sig Dispense Refill  . atorvastatin (LIPITOR) 10 MG tablet Take 10 mg by mouth daily.      . B Complex-[name] (SUPER B COMPLEX PO) Take by mouth.      [name] CALCIUM PO Take 700 mg by mouth daily.      . Cholecalciferol (VITAMIN D-3 PO) Take 2,000 Units by mouth daily.      . Coenzyme Q10 (COQ10) 100 MG CAPS Take by mouth.      . emollient (BIAFINE) cream Apply topically as needed.      [name] levothyroxine (SYNTHROID, LEVOTHROID) 75 MCG tablet Take 75 mcg by mouth every other day.      . levothyroxine (SYNTHROID, LEVOTHROID) 88 MCG tablet Take 88 mcg by mouth every other day.      . Magnesium 400 MG CAPS Take by mouth daily.      . mometasone (NASONEX) 50 MCG/ACT nasal spray Place 2 sprays into the nose daily.      . Multiple Vitamin (MULTIVITAMIN) capsule Take 1 capsule by mouth daily.      . NON FORMULARY Mangosteen 3 oz QD      . tamoxifen (NOLVADEX) 20 MG tablet Take 1 tablet (20 mg total) by mouth daily.  90 tablet  3  . vitamin E (VITAMIN E) 400 UNIT capsule Take 400 Units by mouth daily.       No current facility-administered medications for this visit.    OBJECTIVE: Middle-aged white woman in no acute distress Filed Vitals:   06/21/13 1320  BP: 131/81  Pulse: 71  Temp: 97.9 F (36.6 [name])  Resp: 18     Body mass index is 24.3 kg/(m^2).    ECOG FS: 0 Filed Weights   06/21/13 1320  Weight: 146 lb (66.225 kg)   Sclerae unicteric, pupils equal and round Oropharynx clear and moist-- no thrush No cervical or supraclavicular adenopathy Lungs no rales or rhonchi Heart regular rate and rhythm Abd soft, nontender, positive bowel sounds MSK no focal spinal tenderness, no upper extremity lymphedema Neuro: nonfocal, well oriented, appropriate  affect Breasts: The right breast is unremarkable. The left breast is slightly smaller and firmer than the right, secondary to radiation. There is no evidence of disease recurrence. Left axilla is benign. Breasts: The right breast is unremarkable. The left breast is status post lumpectomy and radiation. There is palpable scar tissue, and both breasts are dense, but there is no evidence of local recurrence. Axillae are benign bilaterally, no adenopathy palpated.  LAB RESULTS: Lab Results  Component Value Date   WBC 9.0 06/16/2013   NEUTROABS 7.1* 06/16/2013   HGB 13.0 06/16/2013   HCT 40.0 06/16/2013   MCV 93.1 06/16/2013   PLT 181 06/16/2013      Chemistry  Component Value Date/Time   NA 140 06/16/2013 1305   NA 137 01/22/2012 1106   K 4.2 06/16/2013 1305   K 4.3 01/22/2012 1106   CL 109* 12/14/2012 1253   CL 104 01/22/2012 1106   CO2 25 06/16/2013 1305   CO2 27 01/22/2012 1106   BUN 18.4 06/16/2013 1305   BUN 16 01/22/2012 1106   CREATININE 1.2* 06/16/2013 1305   CREATININE 1.15* 01/22/2012 1106      Component Value Date/Time   CALCIUM 9.4 06/16/2013 1305   CALCIUM 9.8 01/22/2012 1106   ALKPHOS 54 06/16/2013 1305   ALKPHOS 61 01/22/2012 1106   AST 35* 06/16/2013 1305   AST 25 01/22/2012 1106   ALT 28 06/16/2013 1305   ALT 18 01/22/2012 1106   BILITOT 0.49 06/16/2013 1305   BILITOT 0.6 01/22/2012 1106       STUDIES: No results found. Next mammography will be due April of 2015 and next bone density July or August of 2050  ASSESSMENT: 62 y.o.  Pura Spice woman   (1)  status post left lumpectomy 11/04/2011 for a pT2 pN0, stage IIA invasive ductal carcinoma, grade 2, strongly estrogen and progesterone receptor positive, HER-2 negative, with an MIB-1 of 20%.   (2)  completed radiation treatments June 2013    (3)  started tamoxifen mid-July 2013  (4) breast density category [name]  (5) osteopenia with a T score of -1.4 at the left femoral neck June 2014    PLAN:  Temesha is doing terrific as  far as breast cancer is concerned, with no evidence of disease recurrence now a year and a half out from her surgery. The plan is to continue tamoxifen at least until May of 2015 but more likely until November of 2015 by which time we will have her next bone density, due August 2015. At that time we can decide whether or not to switch to an aromatase inhibitor.  She has changed insurance and I wrote her a prescription for tamoxifen that she may send out to the company's mail order service. I also sent a prescription to [facility], which is the alternative for her.  Amri knows to call for any problems that may develop before next visit here.        [name],[name]    06/21/2013

## 2013-06-21 NOTE — Telephone Encounter (Signed)
appts made and printed...td 

## 2013-06-24 ENCOUNTER — Other Ambulatory Visit: Payer: Self-pay | Admitting: *Deleted

## 2013-06-24 DIAGNOSIS — C50412 Malignant neoplasm of upper-outer quadrant of left female breast: Secondary | ICD-10-CM

## 2013-06-24 MED ORDER — TAMOXIFEN CITRATE 20 MG PO TABS
20.0000 mg | ORAL_TABLET | Freq: Every day | ORAL | Status: DC
Start: 2013-06-24 — End: 2014-07-04

## 2013-06-24 NOTE — Addendum Note (Signed)
Addended by: Billey Co on: 06/24/2013 05:24 PM   Modules accepted: Orders

## 2013-09-11 ENCOUNTER — Encounter (HOSPITAL_BASED_OUTPATIENT_CLINIC_OR_DEPARTMENT_OTHER): Payer: Self-pay | Admitting: Emergency Medicine

## 2013-09-11 ENCOUNTER — Emergency Department (HOSPITAL_BASED_OUTPATIENT_CLINIC_OR_DEPARTMENT_OTHER)
Admission: EM | Admit: 2013-09-11 | Discharge: 2013-09-11 | Disposition: A | Discharge status: Home / Self Care | Payer: 59 | Attending: Emergency Medicine | Admitting: Emergency Medicine

## 2013-09-11 DIAGNOSIS — E785 Hyperlipidemia, unspecified: Secondary | ICD-10-CM | POA: Insufficient documentation

## 2013-09-11 DIAGNOSIS — W261XXA Contact with sword or dagger, initial encounter: Secondary | ICD-10-CM

## 2013-09-11 DIAGNOSIS — Z880 Allergy status to penicillin: Secondary | ICD-10-CM | POA: Insufficient documentation

## 2013-09-11 DIAGNOSIS — Y9389 Activity, other specified: Secondary | ICD-10-CM | POA: Insufficient documentation

## 2013-09-11 DIAGNOSIS — E039 Hypothyroidism, unspecified: Secondary | ICD-10-CM | POA: Insufficient documentation

## 2013-09-11 DIAGNOSIS — Z8739 Personal history of other diseases of the musculoskeletal system and connective tissue: Secondary | ICD-10-CM | POA: Insufficient documentation

## 2013-09-11 DIAGNOSIS — Z853 Personal history of malignant neoplasm of breast: Secondary | ICD-10-CM | POA: Insufficient documentation

## 2013-09-11 DIAGNOSIS — Y92009 Unspecified place in unspecified non-institutional (private) residence as the place of occurrence of the external cause: Secondary | ICD-10-CM | POA: Insufficient documentation

## 2013-09-11 DIAGNOSIS — S61209A Unspecified open wound of unspecified finger without damage to nail, initial encounter: Secondary | ICD-10-CM | POA: Insufficient documentation

## 2013-09-11 DIAGNOSIS — W260XXA Contact with knife, initial encounter: Secondary | ICD-10-CM | POA: Insufficient documentation

## 2013-09-11 DIAGNOSIS — Z79899 Other long term (current) drug therapy: Secondary | ICD-10-CM | POA: Insufficient documentation

## 2013-09-11 DIAGNOSIS — S61011A Laceration without foreign body of right thumb without damage to nail, initial encounter: Secondary | ICD-10-CM

## 2013-09-11 NOTE — ED Provider Notes (Signed)
CSN: [id number]     Arrival date & time 09/11/13  2125 History   First MD Initiated Contact with Patient 09/11/13 2132     Chief Complaint  Patient presents with  . Extremity Laceration     (Consider location/radiation/quality/duration/timing/severity/associated sxs/prior Treatment) Patient is a 63 y.o. female presenting with skin laceration. The history is provided by the patient.  Laceration Location:  Finger Finger laceration location:  R thumb Length (cm):  2 cm Depth:  Through dermis Quality: straight   Time since incident:  4 hours Laceration mechanism:  Knife Pain details:    Quality:  Aching   Severity:  Mild   Timing:  Constant   Progression:  Unchanged Foreign body present:  No foreign bodies Relieved by:  Pressure Worsened by:  Movement Tetanus status:  Up to date  [name] is a 63 y.o. female who presents to the ED with a laceration to the right thumb. She was cutting cheese and preparing for a party at her home and the knife slipped and cut her thumb. She applied pressure and put a dressing over it and continued with her party. One of her guest was a Marine scientist and took the dressing off and looked at the laceration and told her she needed to come in for sutures. Patient denies any other injuries or problems tonight.   Past Medical History  Diagnosis Date  . Hyperlipidemia   . Breastr cancer, IDC, Left UOQ, clinical stage II Receptor +, Her 2 - 08/29/2011  . Hypothyroidism   . Osteoarthritis    Past Surgical History  Procedure Laterality Date  . Ectopic pregnancy surgery  1986-88    s/p unilateral salpingectomy  . Bunionectomy  2000    Left  . Wisdom tooth extraction    . Breast lumpectomy  11/04/11   Family History  Problem Relation Age of Onset  . Heart disease Mother   . Heart disease Father   . Cancer Maternal Aunt     ovarian  . Heart disease Paternal Uncle    History  Substance Use Topics  . Smoking status: Never Smoker   . Smokeless  tobacco: Never Used  . Alcohol Use: Yes   OB History   Grav Para Term Preterm Abortions TAB SAB Ect Mult Living                 Review of Systems Negative except as stated in HPI   Allergies  Oxycodone; Epinephrine; Other; Iodine; Nickel; Penicillins; and Sulfa drugs cross reactors  Home Medications   Current Outpatient Rx  Name  Route  Sig  Dispense  Refill  . atorvastatin (LIPITOR) 10 MG tablet   Oral   Take 10 mg by mouth. 2 TIMES A WEEK         . B Complex-C (SUPER B COMPLEX PO)   Oral   Take by mouth.         [name] CALCIUM PO   Oral   Take 700 mg by mouth daily.         . Cholecalciferol (VITAMIN D-3 PO)   Oral   Take 2,000 Units by mouth daily.         . Coenzyme Q10 (COQ10) 100 MG CAPS   Oral   Take by mouth.         . emollient (BIAFINE) cream   Topical   Apply topically as needed.         [name] levothyroxine (SYNTHROID, LEVOTHROID) 75 MCG tablet  Oral   Take 75 mcg by mouth every other day.         . levothyroxine (SYNTHROID, LEVOTHROID) 88 MCG tablet   Oral   Take 88 mcg by mouth every other day.         . Magnesium 400 MG CAPS   Oral   Take by mouth daily.         . Multiple Vitamin (MULTIVITAMIN) capsule   Oral   Take 1 capsule by mouth daily.         . NON FORMULARY      Mangosteen 3 oz QD         . tamoxifen (NOLVADEX) 20 MG tablet   Oral   Take 1 tablet (20 mg total) by mouth daily.   90 tablet   3   . vitamin E (VITAMIN E) 400 UNIT capsule   Oral   Take 400 Units by mouth daily.          BP 134/64  Pulse 72  Temp(Src) 97.8 F (36.6 C) (Oral)  Ht 5\' 5"  (1.651 m)  Wt 139 lb (63.05 kg)  BMI 23.13 kg/m2  SpO2 99% Physical Exam  Nursing note and vitals reviewed. Constitutional: She is oriented to person, place, and time. She appears well-developed and well-nourished.  HENT:  Head: Normocephalic and atraumatic.  Eyes: Conjunctivae and EOM are normal.  Neck: Neck supple.  Cardiovascular: Normal rate.     Pulmonary/Chest: Effort normal.  Musculoskeletal: Normal range of motion.       Right hand: She exhibits tenderness and laceration. She exhibits normal range of motion, normal capillary refill, no deformity and no swelling. Normal sensation noted. Normal strength noted.       Hands: Neurological: She is alert and oriented to person, place, and time. No cranial nerve deficit.  Skin: Skin is warm and dry.  Psychiatric: She has a normal mood and affect. Her behavior is normal.    ED Course  Procedures  LACERATION REPAIR Performed by: NEESE,HOPE Authorized by: NEESE,HOPE Consent: Verbal consent obtained. Risks and benefits: risks, benefits and alternatives were discussed Consent given by: patient Patient identity confirmed: provided demographic data Prepped and Draped in normal sterile fashion Wound explored  Laceration Location: right thumb palmar aspect  Laceration Length: 2 cm flap  No Foreign Bodies seen or palpated  Anesthesia: local infiltration  Local anesthetic: lidocaine 2% without epinephrine  Anesthetic total: 1.5 ml  Irrigation method: syringe Amount of cleaning: standard  Skin closure: 6-0 prolene  Number of sutures: 4  Technique: interrupted  Patient tolerance: Patient tolerated the procedure well with no immediate complications.  MDM  63 y.o. female with laceration to the right thumb after cutting it with a kitchen knife. Stable for discharge without any further treatment at this time. Discussed with the patient and all questioned fully answered. She will return if any problems arise.     Ashley Mur[place], [place] 09/11/13 2250

## 2013-09-11 NOTE — ED Notes (Signed)
Reports cut her right thumb with a kitchen knife approx 1600 today.

## 2013-09-21 NOTE — ED Provider Notes (Signed)
Medical screening examination/treatment/procedure(s) were performed by non-physician practitioner and as supervising physician I was immediately available for consultation/collaboration.   EKG Interpretation None        Tanna Furry, MD 09/21/13 1623

## 2013-10-18 ENCOUNTER — Encounter: Payer: Self-pay | Admitting: *Deleted

## 2013-10-18 NOTE — [facility] Oncology Navigator Note (Signed)
I called patient to follow up and remind her of upcoming appointments.  Patient reports that she is doing very well.  She continues to take tamoxifen and is tolerating it well with no complaints or concerns.  She denied any questions or needs at this time.  I gave her my contact information and encouraged her to call me as needed.

## 2013-11-08 ENCOUNTER — Ambulatory Visit
Admission: RE | Admit: 2013-11-08 | Discharge: 2013-11-08 | Disposition: A | Discharge status: Home / Self Care | Payer: 59 | Source: Ambulatory Visit | Attending: Physician Assistant | Admitting: Physician Assistant

## 2013-11-08 DIAGNOSIS — Z853 Personal history of malignant neoplasm of breast: Secondary | ICD-10-CM

## 2013-11-08 DIAGNOSIS — R922 Inconclusive mammogram: Secondary | ICD-10-CM

## 2013-11-11 ENCOUNTER — Ambulatory Visit
Admission: RE | Admit: 2013-11-11 | Discharge: 2013-11-11 | Disposition: A | Discharge status: Home / Self Care | Payer: 59 | Source: Ambulatory Visit | Attending: Physician Assistant | Admitting: Physician Assistant

## 2013-11-11 ENCOUNTER — Other Ambulatory Visit: Payer: Self-pay | Admitting: Obstetrics and Gynecology

## 2013-11-11 ENCOUNTER — Other Ambulatory Visit: Payer: Self-pay | Admitting: Physician Assistant

## 2013-11-11 DIAGNOSIS — R928 Other abnormal and inconclusive findings on diagnostic imaging of breast: Secondary | ICD-10-CM

## 2013-11-11 DIAGNOSIS — R922 Inconclusive mammogram: Secondary | ICD-10-CM

## 2013-11-11 DIAGNOSIS — Z853 Personal history of malignant neoplasm of breast: Secondary | ICD-10-CM

## 2013-11-11 MED ORDER — GADOBENATE DIMEGLUMINE 529 MG/ML IV SOLN
12.0000 mL | Freq: Once | INTRAVENOUS | Status: AC | PRN
  Administered 2013-11-11: 12 mL via INTRAVENOUS

## 2013-11-18 ENCOUNTER — Other Ambulatory Visit: Payer: Self-pay | Admitting: Physician Assistant

## 2013-11-18 DIAGNOSIS — R928 Other abnormal and inconclusive findings on diagnostic imaging of breast: Secondary | ICD-10-CM

## 2013-11-19 ENCOUNTER — Ambulatory Visit
Admission: RE | Admit: 2013-11-19 | Discharge: 2013-11-19 | Disposition: A | Discharge status: Home / Self Care | Payer: 59 | Source: Ambulatory Visit | Attending: Physician Assistant | Admitting: Physician Assistant

## 2013-11-19 DIAGNOSIS — R928 Other abnormal and inconclusive findings on diagnostic imaging of breast: Secondary | ICD-10-CM

## 2013-11-19 MED ORDER — GADOBENATE DIMEGLUMINE 529 MG/ML IV SOLN
12.0000 mL | Freq: Once | INTRAVENOUS | Status: AC | PRN
  Administered 2013-11-19: 12 mL via INTRAVENOUS

## 2013-11-25 ENCOUNTER — Telehealth: Payer: Self-pay | Admitting: Physician Assistant

## 2013-11-25 ENCOUNTER — Telehealth: Payer: Self-pay | Admitting: Oncology

## 2013-11-25 NOTE — Telephone Encounter (Signed)
, °

## 2013-12-20 ENCOUNTER — Other Ambulatory Visit: Payer: 59

## 2013-12-21 ENCOUNTER — Other Ambulatory Visit (HOSPITAL_BASED_OUTPATIENT_CLINIC_OR_DEPARTMENT_OTHER): Payer: 59

## 2013-12-21 DIAGNOSIS — C50412 Malignant neoplasm of upper-outer quadrant of left female breast: Secondary | ICD-10-CM

## 2013-12-21 DIAGNOSIS — C50419 Malignant neoplasm of upper-outer quadrant of unspecified female breast: Secondary | ICD-10-CM

## 2013-12-21 LAB — CBC WITH DIFFERENTIAL/PLATELET
BASO%: 0.6 % (ref 0.0–2.0)
BASOS ABS: 0 10*3/uL (ref 0.0–0.1)
EOS ABS: 0.1 10*3/uL (ref 0.0–0.5)
EOS%: 1.1 % (ref 0.0–7.0)
HCT: 38.7 % (ref 34.8–46.6)
HGB: 12.8 g/dL (ref 11.6–15.9)
LYMPH%: 17 % (ref 14.0–49.7)
MCH: 30.6 pg (ref 25.1–34.0)
MCHC: 33 g/dL (ref 31.5–36.0)
MCV: 92.8 fL (ref 79.5–101.0)
MONO#: 0.4 10*3/uL (ref 0.1–0.9)
MONO%: 6.3 % (ref 0.0–14.0)
NEUT#: 4.5 10*3/uL (ref 1.5–6.5)
NEUT%: 75 % (ref 38.4–76.8)
Platelets: 181 10*3/uL (ref 145–400)
RBC: 4.17 10*6/uL (ref 3.70–5.45)
RDW: 13.6 % (ref 11.2–14.5)
WBC: 6 10*3/uL (ref 3.9–10.3)
lymph#: 1 10*3/uL (ref 0.9–3.3)

## 2013-12-21 LAB — COMPREHENSIVE METABOLIC PANEL (CC13)
ALT: 21 U/L (ref 0–55)
AST: 25 U/L (ref 5–34)
Albumin: 3.5 g/dL (ref 3.5–5.0)
Alkaline Phosphatase: 54 U/L (ref 40–150)
Anion Gap: 7 mEq/L (ref 3–11)
BILIRUBIN TOTAL: 0.36 mg/dL (ref 0.20–1.20)
BUN: 16.3 mg/dL (ref 7.0–26.0)
CO2: 26 mEq/L (ref 22–29)
Calcium: 8.7 mg/dL (ref 8.4–10.4)
Chloride: 106 mEq/L (ref 98–109)
Creatinine: 1.1 mg/dL (ref 0.6–1.1)
GLUCOSE: 110 mg/dL (ref 70–140)
POTASSIUM: 4.1 meq/L (ref 3.5–5.1)
SODIUM: 139 meq/L (ref 136–145)
Total Protein: 6.5 g/dL (ref 6.4–8.3)

## 2013-12-27 ENCOUNTER — Encounter: Payer: Self-pay | Admitting: Physician Assistant

## 2013-12-27 ENCOUNTER — Ambulatory Visit (HOSPITAL_BASED_OUTPATIENT_CLINIC_OR_DEPARTMENT_OTHER): Payer: 59 | Admitting: Physician Assistant

## 2013-12-27 VITALS — BP 111/71 | HR 80 | Temp 98.1°F | Resp 18 | Ht 65.0 in | Wt 145.0 lb

## 2013-12-27 DIAGNOSIS — M899 Disorder of bone, unspecified: Secondary | ICD-10-CM

## 2013-12-27 DIAGNOSIS — R922 Inconclusive mammogram: Secondary | ICD-10-CM

## 2013-12-27 DIAGNOSIS — Z170 Estrogen receptor positive status [ER+]: Secondary | ICD-10-CM

## 2013-12-27 DIAGNOSIS — Z780 Asymptomatic menopausal state: Secondary | ICD-10-CM | POA: Insufficient documentation

## 2013-12-27 DIAGNOSIS — N6310 Unspecified lump in the right breast, unspecified quadrant: Secondary | ICD-10-CM | POA: Insufficient documentation

## 2013-12-27 DIAGNOSIS — Z853 Personal history of malignant neoplasm of breast: Secondary | ICD-10-CM

## 2013-12-27 DIAGNOSIS — M8580 Other specified disorders of bone density and structure, unspecified site: Secondary | ICD-10-CM | POA: Insufficient documentation

## 2013-12-27 DIAGNOSIS — C50519 Malignant neoplasm of lower-outer quadrant of unspecified female breast: Secondary | ICD-10-CM

## 2013-12-27 DIAGNOSIS — M949 Disorder of cartilage, unspecified: Secondary | ICD-10-CM

## 2013-12-27 DIAGNOSIS — C50412 Malignant neoplasm of upper-outer quadrant of left female breast: Secondary | ICD-10-CM

## 2013-12-27 NOTE — Progress Notes (Signed)
[name]    12/27/2013  ID: [name]   DOB: 02/02/1951  MR#: [id number]  TDV#:[id number]   PCP: [name], MD SU: [name] GYN: Okey Dupre OTHER MD: Lance Morin  CC: "I'm doing fine"  HISTORY OF PRESENT ILLNESS: The patient had routine screening mammography at [facility] 08/27/2011, suggesting a potential abnormality in the left breast. Additional views 08/29/2011 confirmed an irregular mass with architectural or distortion measuring up to 4 cm in the left breast, without associated microcalcifications. By ultrasound this was irregular, and showed posterior shadowing. It measured 3.6 cm sonographically. There were no suspicious lymph nodes noted in the left axilla.  The same day the patient underwent ultrasound-guided biopsy of the left breast mass, showing ([id number]) and invasive ductal carcinoma, grade 1, which was 100% estrogen receptor and 97% progesterone receptor positive. The proliferation marker was 20%. There was no HER-2 amplification with a ratio by CISH of 1.09.  The patient had bilateral breast MRI 09/03/2011 showing a solitary 3.8 cm left breast lesion, and on 11/04/2011 underwent left lumpectomy and sentinel lymph node sampling with results and subsequent history as detailed below.   INTERVAL HISTORY: [name] returns alone today for a routine six-month followup of her left breast cancer. She continues on tamoxifen which she is tolerating well. She does have hot flashes, and feels like these have actually improved slightly over the last few months. She has some decreased libido which she has discussed with her gynecologist, but she denies any significant vaginal dryness and has had no vaginal discharge or abnormal bleeding. She's had no peripheral swelling. She notes no change in vision. She's had no abnormal bleeding or abnormal clotting.   Interval history is notable for the fact that [name] had a mammogram in late April which appeared benign, but showed  very dense breast tissue. A subsequent breast MRI on 11/11/2013 showed 2 abnormalities in the lower outer quadrant of the right breast. These were subsequently biopsied on 11/19/2013 showing fibrocystic changes and PASH, with no malignancy identified. These results are all detailed below.    REVIEW OF SYSTEMS:  Daja denies any recent illnesses and has had no fevers or chills. She denies any rashes or skin changes and has had no abnormal bruising or bleeding. Her energy level is good, and she continues to exercise on a regular basis. She denies any nausea, emesis, or change in bowel or bladder habits. She's had no increased cough, phlegm production, shortness of breath, peripheral swelling, chest pain, or palpitations. She's had no abnormal headaches or dizziness. She denies any unusual myalgias, arthralgias, or bony pain.  A detailed review of systems is otherwise stable and noncontributory.   PAST MEDICAL HISTORY: Past Medical History  Diagnosis Date  . Hyperlipidemia   . Breastr cancer, IDC, Left UOQ, clinical stage II Receptor +, Her 2 - 08/29/2011  . Hypothyroidism   . Osteoarthritis     PAST SURGICAL HISTORY: Past Surgical History  Procedure Laterality Date  . Ectopic pregnancy surgery  1986-88    s/p unilateral salpingectomy  . Bunionectomy  2000    Left  . Wisdom tooth extraction    . Breast lumpectomy  11/04/11    FAMILY HISTORY Family History  Problem Relation Age of Onset  . Heart disease Mother   . Heart disease Father   . Cancer Maternal Aunt     ovarian  . Heart disease Paternal Uncle    the patient's father died at the age of 60 from myocardial infarction. The  patient's mother died from congestive heart failure at the age of 71. The patient has one brother age 66 with prostate cancer diagnosed at age 23. The patient's sister is 36 as of 2013. There is no breast or ovarian cancer in the family  GYNECOLOGIC HISTORY:  (Reviewed 12/27/2013) She had menarche at around  age 63. She is GX P1, first pregnancy to term age 63. She stopped having periods about 2003. 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 Her husband Rush Landmark  has had prostate cancer, status post radiation treatments in [place] about 2 years ago. Her daughter Lanelle Bal lives in [place]. She has 2 children. 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 The patient attends the [employer].   ADVANCED DIRECTIVES: in place  HEALTH MAINTENANCE: (Updated 12/27/2013) History  Substance Use Topics  . Smoking status: Never Smoker   . Smokeless tobacco: Never Used  . Alcohol Use: Yes     Colonoscopy: repeat due 2015  PAP: UTD Tobie Poet)  Bone density: June 2013, osteopenia with T score -1.4  Lipid panel: June 2014  Allergies  Allergen Reactions  . Oxycodone Anxiety  . Epinephrine     Sever headaches  . Other     Hospital gown  . Iodine Rash  . Nickel Rash    Rash and blisters  . Penicillins Rash    Rash and blisters  . Sulfa Drugs Cross Reactors Rash    Current Outpatient Prescriptions  Medication Sig Dispense Refill  . atorvastatin (LIPITOR) 10 MG tablet Take 10 mg by mouth. 2 TIMES A WEEK      . B Complex-C (SUPER B COMPLEX PO) Take by mouth.      [name] CALCIUM PO Take 700 mg by mouth daily.      . Cholecalciferol (VITAMIN D-3 PO) Take 2,000 Units by mouth daily.      [name] levothyroxine (SYNTHROID, LEVOTHROID) 75 MCG tablet Take 75 mcg by mouth every other day.      . levothyroxine (SYNTHROID, LEVOTHROID) 88 MCG tablet Take 88 mcg by mouth every other day.      . Magnesium 400 MG CAPS Take by mouth daily.      . Multiple Vitamin (MULTIVITAMIN) capsule Take 1 capsule by mouth daily.      . NON FORMULARY Mangosteen 3 oz QD      . tamoxifen (NOLVADEX) 20 MG tablet Take  1 tablet (20 mg total) by mouth daily.  90 tablet  3  . vitamin E (VITAMIN E) 400 UNIT capsule Take 400 Units by mouth daily.      . Coenzyme Q10 (COQ10) 100 MG CAPS Take by mouth.      . emollient (BIAFINE) cream Apply topically as needed.       No current facility-administered medications for this visit.    OBJECTIVE: Middle-aged white woman who appears well and younger than her stated age 14 Vitals:   12/27/13 1257  BP: 111/71  Pulse: 80  Temp: 98.1 F (36.7 C)  Resp: 18     Body mass index is 24.13 kg/(m^2).    ECOG FS: 0 Filed Weights   12/27/13 1257  Weight: 145 lb (65.772 kg)   Physical Exam: HEENT:  Sclerae anicteric.  Oropharynx clear. Buccal mucosa is pink and moist. Neck is  supple, trachea midline. No thyromegaly. NODES:  No cervical or supraclavicular lymphadenopathy palpated.  BREAST EXAM:  Right breast is status post recent biopsies, mildly tender to palpation , but with no obvious masses palpated. No skin changes or current ecchymoses. Left breast is status post lumpectomy, with no suspicious nodularities or skin changes. No evidence of local recurrence. Breast tissue is dense bilaterally. Axillae are benign bilaterally, no palpable lymphadenopathy. LUNGS:  Clear to auscultation bilaterally.  No wheezes or rhonchi HEART:  Regular rate and rhythm. No murmur appreciated. ABDOMEN:  Soft, nontender.  Positive bowel sounds.  MSK:  No focal spinal tenderness to palpation. Good range of motion bilaterally in the upper extremities. EXTREMITIES:  No peripheral edema.  No lymphedema in the left upper extremity. SKIN:  No visible rashes. No excessive ecchymoses or petechiae. No pallor. Good skin turgor. NEURO:  Nonfocal. Well oriented.  Appropriate affect.    LAB RESULTS: Lab Results  Component Value Date   WBC 6.0 12/21/2013   NEUTROABS 4.5 12/21/2013   HGB 12.8 12/21/2013   HCT 38.7 12/21/2013   MCV 92.8 12/21/2013   PLT 181 12/21/2013      Chemistry      Component Value  Date/Time   NA 139 12/21/2013 1243   NA 137 01/22/2012 1106   K 4.1 12/21/2013 1243   K 4.3 01/22/2012 1106   CL 109* 12/14/2012 1253   CL 104 01/22/2012 1106   CO2 26 12/21/2013 1243   CO2 27 01/22/2012 1106   BUN 16.3 12/21/2013 1243   BUN 16 01/22/2012 1106   CREATININE 1.1 12/21/2013 1243   CREATININE 1.15* 01/22/2012 1106      Component Value Date/Time   CALCIUM 8.7 12/21/2013 1243   CALCIUM 9.8 01/22/2012 1106   ALKPHOS 54 12/21/2013 1243   ALKPHOS 61 01/22/2012 1106   AST 25 12/21/2013 1243   AST 25 01/22/2012 1106   ALT 21 12/21/2013 1243   ALT 18 01/22/2012 1106   BILITOT 0.36 12/21/2013 1243   BILITOT 0.6 01/22/2012 1106       STUDIES:  Most recent bone density on 12/31/2011 showed osteopenia with a T score of -1.4.  MM Digital Diagnositic Bilat 11/08/2013  CLINICAL DATA: History of left breast cancer, status post  lumpectomy and radiation therapy in 2013. Annual exam. Patient is  asymptomatic.  EXAM:  DIGITAL DIAGNOSTIC BILATERAL MAMMOGRAM WITH CAD  COMPARISON: With priors  ACR Breast Density Category c: The breast tissue is heterogeneously  dense, which may obscure small masses.  FINDINGS:  Stable lumpectomy changes in the deep upper outer left breast. No  mass, nonsurgical distortion, or suspicious microcalcification is  identified in either breast to suggest malignancy.  Mammographic images were processed with CAD.  IMPRESSION:  No evidence of malignancy in either breast. Stable lumpectomy  changes in the left breast.  RECOMMENDATION:  Diagnostic mammogram is suggested in 1 year. (Code:[id number]-B-01Y)  I have discussed the findings and recommendations with the patient.  Results were also provided in writing at the conclusion of the  visit. If applicable, a reminder letter will be sent to the patient  regarding the next appointment.  BI-RADS CATEGORY 2: Benign Finding(s)  Electronically Signed  By: Curlene Dolphin M.D.  On: 11/08/2013 09:39  MR Breast Bilateral W Wo  Contrast 11/11/2013  CLINICAL DATA: Status post left lumpectomy and radiation therapy  for breast cancer in 2013.  LABS: BUN and creatinine were obtained on site at [facility] at  [address]  Results: BUN 15 mg/dL, Creatinine 1.0 mg/dL.  EXAM:  BILATERAL BREAST MRI WITH AND WITHOUT CONTRAST  TECHNIQUE:  Multiplanar, multisequence MR images of both breasts were obtained  prior to and following the intravenous administration of 15m of  MultiHance.  THREE-DIMENSIONAL MR IMAGE RENDERING ON INDEPENDENT WORKSTATION:  Three-dimensional MR images were rendered by post-processing of the  original MR data on an independent workstation. The  three-dimensional MR images were interpreted, and findings are  reported in the following complete MRI report for this study. Three  dimensional images were evaluated at the independent DynaCad  workstation  COMPARISON: Previous examinations, including the bilateral  diagnostic mammogram dated 11/08/2013 and bilateral breast MRI dated  09/03/2011.  FINDINGS:  Breast composition: c: Heterogeneous fibroglandular tissue  Background parenchymal enhancement: Minimal  Right breast: Interval two areas of clumped, non mass enhancement in  the lower outer quadrant of the right breast. The more posteriorly  located area measures 1.8 x 1.4 x 1.0 cm in maximum dimensions and  has predominantly plateau enhancement kinetics. The more anteriorly  located area measures 1.3 x 0.6 x 0.6 cm and has predominantly  persistent enhancement kinetics. There is also a small irregular  area of low-grade enhancement in the outer right breast at and  slightly above the level of the nipple, in the anterior third of the  breast. This has persistent enhancement kinetics. This is slightly  more prominent than a similar area of enhancement seen on  09/03/2011.  Left breast: Post lumpectomy changes. No mass or abnormal  enhancement.  Lymph nodes: No abnormal  appearing lymph nodes.  Ancillary findings: None.  IMPRESSION:  1. Interval 1.8 cm and 1.3 cm areas of clumped, non mass enhancement  in the lower outer quadrant of the right breast. These could  represent areas of DCIS or fibrocystic changes. MR guided core  needle biopsy of both of these areas is recommended and will be  scheduled.  2. Slightly more prominent small, irregular area of enhancement in  the anterior aspect of the right breast, laterally. The minimal  change in an over 2 year period of time is compatible with a benign  process.  3. No evidence of malignancy elsewhere in either breast.  RECOMMENDATION:  Right breast MR guided core needle biopsies. These will be  scheduled.  BI-RADS CATEGORY 4: Suspicious.  Electronically Signed  By: SEnrique SackM.D.  On: 11/11/2013 15:53  MR RT BREAST BX W LOC/PATHOLOGY 11/22/2013   ENolon Nations MD Mon Nov 22, 2013 3:51:20 PM EDT       ADDENDUM REPORT: 11/22/2013 15:48  ADDENDUM:  PATHOLOGY ADDENDUM:  Pathology  Breast, right, needle core biopsy, LOQ, anterior, above  - FIBROCYSTIC CHANGES WITH CALCIFICATIONS AND USUAL DUCTAL  HYPERPLASIA.  - PSEUDOANGIOMATOUS STROMAL HYPERPLASIA (PCanon.  - NO MALIGNANCY IDENTIFIED.  Breast, right, needle core biopsy, LOQ posterior, above  - FIBROCYSTIC CHANGES WITH CALCIFICATIONS AND USUAL DUCTAL  HYPERPLASIA.  - PSEUDOANGIOMATOUS STROMAL HYPERPLASIA (PMarina.  - NO MALIGNANCY IDENTIFIED.  Pathology concordance with imaging findings: Yes at both sites  Recommendation: Bilateral MRI is recommended in 6 months.  At the request of the patient, I spoke with her by telephone on  11/22/2013. She reports doing well after the biopsy .  Electronically Signed  By: BShon HaleM.D.  On: 11/22/2013 15:48       Study Result    CLINICAL DATA: History of left breast cancer treated with  lumpectomy and radiation treatment in 2013. Patient has 2 areas of  enhancement in the right breast. She  presents for biopsy.  EXAM:  MR GUIDED BREAST CORE NEEDLE BIOPSY WITH VACUUM ASSISTANCE 2 SITES  IN THE RIGHT BREAST  TECHNIQUE:  Multiplanar, multisequence MR imaging of the right breast was  performed both before and after administration of intravenous  contrast.  CONTRAST: 67m MULTIHANCE GADOBENATE DIMEGLUMINE 529 MG/ML IV SOLN  COMPARISON: Previous exams.  FINDINGS:  I met with the patient, and we discussed the procedure of MRI guided  biopsy, including risks, benefits, and alternatives. Specifically,  we discussed the risks of infection, bleeding, tissue injury, clip  migration, and inadequate sampling. Informed, written consent was  given. The usual time out protocol was performed immediately prior  to the procedure.  Using sterile technique, 2% Lidocaine, MRI guidance, and a 9 gauge  vacuum assisted device, biopsy was performed of anterior area of  enhancement in the lower outer quadrant of the right breast using a  lateral approach. At the conclusion of the procedure, a tissue  marker clip was deployed into the biopsy cavity.  Using the same technique, biopsy was performed at the posterior area  of enhancement in the lower outer quadrant of the right breast. A  tissue marker clip was deployed into the biopsy cavity. Follow-up  2-view mammogram was performed and dictated separately.  IMPRESSION:  MRI guided biopsy of 2 right breast lesions. No apparent  complications.  Electronically Signed:  By: BShon HaleM.D.  On: 11/19/2013 09:56        ASSESSMENT: 63y.o.  JStarling Mannswoman   (1)  status post left lumpectomy 11/04/2011 for a pT2 pN0, stage IIA invasive ductal carcinoma, grade 2, strongly estrogen and progesterone receptor positive, HER-2 negative, with an MIB-1 of 20%.   (2)  completed radiation treatments June 2013    (3)  started tamoxifen mid-July 2013  (4) breast density category C  (5) osteopenia with a T score of -1.4 at the left femoral neck June  2013  (6)   status post breast MRI in April 2015 showing 2 abnormalities in the right breast, subsequently biopsied with pathology report showing fibrocystic changes with calcifications and ductal hyperplasia and PASH, but no malignancy identified.   PLAN:  The majority of our 25 minute appointment today was spent reviewing Glessie's concerns, reviewing her recent study results, biopsy and pathology results, and coordinating care.  DDelaneyis  really doing quite well with regards to her breast cancer, and is tolerating the tamoxifen well which she will continue. She is very concerned about the "false-positive" results of the MRI in April. Understandably, this was not only and uncomfortable but also a very anxiety provoking situation. She is very hesitant about additional breast MRIs. The recommendation has actually been to repeat a breast MRI in 6 months (late October 2015). After much discussion, DAneriagrees instead to have a diagnostic right mammogram with a right breast ultrasound at that time for assessment, rather than the breast MRI. Certainly, if there are any additional abnormalities noted at that time, she would be open to additional MRI imaging.  In addition, she is due for her bone density to be repeated this year, and we will try to schedule that to be done in late October during the same appointment. She'll be seeing Dr. MJana Hakim6 months from now, in early December 2015 to review all of the above results. At that time, she will have been on the tamoxifen for slightly more than 2 years.  Depending on the bone density  results, they will discuss the possibility of switching to an aromatase inhibitor.  All this was reviewed in detail with Samantha today. She is comfortable with this plan, and voices her understanding and agreement.  She will call with any changes or problems prior to her next appointment.    Kiva Norland  PA-C   12/27/2013

## 2013-12-29 ENCOUNTER — Telehealth: Payer: Self-pay | Admitting: Oncology

## 2013-12-29 NOTE — Telephone Encounter (Signed)
, °

## 2014-04-04 ENCOUNTER — Other Ambulatory Visit (HOSPITAL_COMMUNITY): Payer: Self-pay | Admitting: Respiratory Therapy

## 2014-04-11 ENCOUNTER — Telehealth: Payer: Self-pay | Admitting: Oncology

## 2014-04-11 NOTE — Telephone Encounter (Signed)
received message from Amy W re pt calling for appts. pt on schedule for appts since and was contacted. lmonvm for pt re dec appts for lb/GM amd 10/5 appts @ [facility]. schedule mailed.

## 2014-04-13 ENCOUNTER — Telehealth: Payer: Self-pay | Admitting: Oncology

## 2014-04-13 NOTE — Telephone Encounter (Signed)
per pt walk in to r/s appt-gave pt copy of r/s appt

## 2014-04-18 ENCOUNTER — Ambulatory Visit
Admission: RE | Admit: 2014-04-18 | Discharge: 2014-04-18 | Disposition: A | Discharge status: Home / Self Care | Payer: Medicare Other | Source: Ambulatory Visit | Attending: Oncology | Admitting: Oncology

## 2014-04-18 ENCOUNTER — Other Ambulatory Visit: Payer: Self-pay | Admitting: Physician Assistant

## 2014-04-18 DIAGNOSIS — R922 Inconclusive mammogram: Secondary | ICD-10-CM

## 2014-04-18 DIAGNOSIS — Z853 Personal history of malignant neoplasm of breast: Secondary | ICD-10-CM

## 2014-04-18 DIAGNOSIS — Z780 Asymptomatic menopausal state: Secondary | ICD-10-CM

## 2014-04-18 DIAGNOSIS — N6310 Unspecified lump in the right breast, unspecified quadrant: Secondary | ICD-10-CM

## 2014-04-18 DIAGNOSIS — M8580 Other specified disorders of bone density and structure, unspecified site: Secondary | ICD-10-CM

## 2014-05-10 ENCOUNTER — Other Ambulatory Visit: Payer: 59

## 2014-06-07 ENCOUNTER — Encounter: Payer: Self-pay | Admitting: *Deleted

## 2014-06-07 NOTE — [facility] Oncology Navigator Note (Signed)
Patient returned my call.  She reports that she is doing well with no questions at this time.  Her only concern is her frustration, stress and dissatisfaction with difficulties getting appointments scheduled due to calls to scheduling not being returned.  We discussed that any time she has difficulty to call me so that I can help facilitate the process.  I encouraged her to call me for any need that she may have.  We reviewed her upcoming appointment schedule.  Patient denied any other needs at this time.

## 2014-06-07 NOTE — [facility] Oncology Navigator Note (Signed)
Called patient to check in.  Left voicemail message and contact information and request that she return my call.

## 2014-06-20 ENCOUNTER — Other Ambulatory Visit: Payer: 59

## 2014-06-23 ENCOUNTER — Other Ambulatory Visit (HOSPITAL_BASED_OUTPATIENT_CLINIC_OR_DEPARTMENT_OTHER): Payer: Medicare Other

## 2014-06-23 DIAGNOSIS — Z853 Personal history of malignant neoplasm of breast: Secondary | ICD-10-CM

## 2014-06-23 DIAGNOSIS — C50512 Malignant neoplasm of lower-outer quadrant of left female breast: Secondary | ICD-10-CM

## 2014-06-23 LAB — COMPREHENSIVE METABOLIC PANEL (CC13)
ALK PHOS: 56 U/L (ref 40–150)
ALT: 30 U/L (ref 0–55)
AST: 33 U/L (ref 5–34)
Albumin: 3.8 g/dL (ref 3.5–5.0)
Anion Gap: 10 mEq/L (ref 3–11)
BILIRUBIN TOTAL: 0.48 mg/dL (ref 0.20–1.20)
BUN: 17.7 mg/dL (ref 7.0–26.0)
CO2: 26 meq/L (ref 22–29)
CREATININE: 1.2 mg/dL — AB (ref 0.6–1.1)
Calcium: 9.6 mg/dL (ref 8.4–10.4)
Chloride: 107 mEq/L (ref 98–109)
EGFR: 45 mL/min/{1.73_m2} — AB (ref >90)
GLUCOSE: 75 mg/dL (ref 70–140)
Potassium: 4.1 mEq/L (ref 3.5–5.1)
Sodium: 143 mEq/L (ref 136–145)
Total Protein: 6.7 g/dL (ref 6.4–8.3)

## 2014-06-23 LAB — CBC WITH DIFFERENTIAL/PLATELET
BASO%: 0.5 % (ref 0.0–2.0)
BASOS ABS: 0 10*3/uL (ref 0.0–0.1)
EOS ABS: 0.1 10*3/uL (ref 0.0–0.5)
EOS%: 1.4 % (ref 0.0–7.0)
HEMATOCRIT: 39.9 % (ref 34.8–46.6)
HEMOGLOBIN: 13.3 g/dL (ref 11.6–15.9)
LYMPH%: 21.7 % (ref 14.0–49.7)
MCH: 30.9 pg (ref 25.1–34.0)
MCHC: 33.3 g/dL (ref 31.5–36.0)
MCV: 92.8 fL (ref 79.5–101.0)
MONO#: 0.3 10*3/uL (ref 0.1–0.9)
MONO%: 7.8 % (ref 0.0–14.0)
NEUT%: 68.6 % (ref 38.4–76.8)
NEUTROS ABS: 3 10*3/uL (ref 1.5–6.5)
PLATELETS: 172 10*3/uL (ref 145–400)
RBC: 4.3 10*6/uL (ref 3.70–5.45)
RDW: 13.7 % (ref 11.2–14.5)
WBC: 4.4 10*3/uL (ref 3.9–10.3)
lymph#: 1 10*3/uL (ref 0.9–3.3)

## 2014-06-27 ENCOUNTER — Ambulatory Visit: Payer: 59 | Admitting: Oncology

## 2014-06-27 ENCOUNTER — Other Ambulatory Visit: Payer: Self-pay | Admitting: *Deleted

## 2014-06-30 ENCOUNTER — Telehealth: Payer: Self-pay | Admitting: Oncology

## 2014-06-30 ENCOUNTER — Ambulatory Visit (HOSPITAL_BASED_OUTPATIENT_CLINIC_OR_DEPARTMENT_OTHER): Payer: Medicare Other | Admitting: Oncology

## 2014-06-30 ENCOUNTER — Encounter: Payer: Self-pay | Admitting: *Deleted

## 2014-06-30 VITALS — BP 117/63 | HR 79 | Temp 98.0°F | Resp 20 | Ht 65.0 in | Wt 145.9 lb

## 2014-06-30 DIAGNOSIS — M8580 Other specified disorders of bone density and structure, unspecified site: Secondary | ICD-10-CM

## 2014-06-30 DIAGNOSIS — C50412 Malignant neoplasm of upper-outer quadrant of left female breast: Secondary | ICD-10-CM

## 2014-06-30 DIAGNOSIS — Z170 Estrogen receptor positive status [ER+]: Secondary | ICD-10-CM

## 2014-06-30 DIAGNOSIS — C50512 Malignant neoplasm of lower-outer quadrant of left female breast: Secondary | ICD-10-CM

## 2014-06-30 NOTE — Telephone Encounter (Signed)
per pof to sch pt appt-gave pt copy of sch °

## 2014-06-30 NOTE — [facility] Oncology Navigator Note (Signed)
Patient at [facility] for follow-up visit with Dr. [name].  She reports that she is doing well and feeling good.  She denies any needs at this time.  I gave her my card with contact information and encouraged her to call me for any questions or concerns.

## 2014-06-30 NOTE — Progress Notes (Signed)
[name],[name]    06/30/2014  ID: [name]   DOB: Sep 17, 1950  MR#: [id number]  WUJ#:[id number]   PCP: [name], MD SU: [name] GYN: Okey Dupre OTHER MD: Lance Morin  CHIEF COMPLAINT: Estrogen receptor positive breast cancer  CURRENT TREATMENT: Tamoxifen  BREAST CANCER HISTORY: From the original intake note:  The patient had routine screening mammography at [facility] 08/27/2011, suggesting a potential abnormality in the left breast. Additional views 08/29/2011 confirmed an irregular mass with architectural or distortion measuring up to 4 cm in the left breast, without associated microcalcifications. By ultrasound this was irregular, and showed posterior shadowing. It measured 3.6 cm sonographically. There were no suspicious lymph nodes noted in the left axilla.  The same day the patient underwent ultrasound-guided biopsy of the left breast mass, showing ([id number]) and invasive ductal carcinoma, grade 1, which was 100% estrogen receptor and 97% progesterone receptor positive. The proliferation marker was 20%. There was no HER-2 amplification with a ratio by CISH of 1.09.  The patient had bilateral breast MRI 09/03/2011 showing a solitary 3.8 cm left breast lesion, and on 11/04/2011 underwent left lumpectomy and sentinel lymph node sampling with results and subsequent history as detailed below.   INTERVAL HISTORY: Toshia returns today for follow-up of her breast care. The interval history is generally unremarkable. Work is not stressful. Family is doing well. She is tolerating tamoxifen with no significant side effects and particularly no significant hot flashes. She doesn't experience any vaginal wetness problems.  REVIEW OF SYSTEMS:  Aside from some anxiety relating to her breast cancer diagnosis (and having received a letter from [facility] imaging recommending a repeat MRI of the breast) a detailed review of systems today was entirely benign  PAST MEDICAL  HISTORY: Past Medical History  Diagnosis Date  . Hyperlipidemia   . Breastr cancer, IDC, Left UOQ, clinical stage II Receptor +, Her 2 - 08/29/2011  . Hypothyroidism   . Osteoarthritis     PAST SURGICAL HISTORY: Past Surgical History  Procedure Laterality Date  . Ectopic pregnancy surgery  1986-88    s/p unilateral salpingectomy  . Bunionectomy  2000    Left  . Wisdom tooth extraction    . Breast lumpectomy  11/04/11    FAMILY HISTORY Family History  Problem Relation Age of Onset  . Heart disease Mother   . Heart disease Father   . Cancer Maternal Aunt     ovarian  . Heart disease Paternal Uncle    the patient's father died at the age of 76 from myocardial infarction. The patient's mother died from congestive heart failure at the age of 35. The patient has one brother age 70 with prostate cancer diagnosed at age 23. The patient's sister is 19 as of 2013. There is no breast or ovarian cancer in the family  GYNECOLOGIC HISTORY:  (Reviewed 12/27/2013) She had menarche at around age 63. She is GX P1, first pregnancy to term age 18. She stopped having periods about 2003. 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 Her husband Rush Landmark  has had prostate cancer, status post radiation treatments in [place] about 2 years ago. Her daughter Lanelle Bal lives in [place]. She has 2 children. 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 The patient attends the [employer].  ADVANCED DIRECTIVES: in place  HEALTH MAINTENANCE: (Updated 12/27/2013) History  Substance Use Topics  . Smoking status: Never Smoker   . Smokeless tobacco: Never Used  . Alcohol Use: Yes     Colonoscopy: repeat due 2015  PAP: UTD Tobie Poet)  Bone density: June 2013, osteopenia with T score  -1.4  Lipid panel: June 2014  Allergies  Allergen Reactions  . Oxycodone Anxiety  . Epinephrine     Sever headaches  . Other     Hospital gown  . Iodine Rash  . Nickel Rash    Rash and blisters  . Penicillins Rash    Rash and blisters  . Sulfa Drugs Cross Reactors Rash    Current Outpatient Prescriptions  Medication Sig Dispense Refill  . atorvastatin (LIPITOR) 10 MG tablet Take 10 mg by mouth. 2 TIMES A WEEK    . B Complex-C (SUPER B COMPLEX PO) Take by mouth.    [name] CALCIUM PO Take 700 mg by mouth daily.    . Cholecalciferol (VITAMIN D-3 PO) Take 2,000 Units by mouth daily.    . Coenzyme Q10 (COQ10) 100 MG CAPS Take by mouth.    . emollient (BIAFINE) cream Apply topically as needed.    [name] levothyroxine (SYNTHROID, LEVOTHROID) 75 MCG tablet Take 75 mcg by mouth every other day.    . levothyroxine (SYNTHROID, LEVOTHROID) 88 MCG tablet Take 88 mcg by mouth every other day.    . Magnesium 400 MG CAPS Take by mouth daily.    . Multiple Vitamin (MULTIVITAMIN) capsule Take 1 capsule by mouth daily.    . NON FORMULARY Mangosteen 3 oz QD    . tamoxifen (NOLVADEX) 20 MG tablet Take 1 tablet (20 mg total) by mouth daily. 90 tablet 3  . vitamin E (VITAMIN E) 400 UNIT capsule Take 400 Units by mouth daily.     No current facility-administered medications for this visit.    OBJECTIVE: Middle-aged white woman who appears well and thank you in no acute distress Filed Vitals:   06/30/14 1416  BP: 117/63  Pulse: 79  Temp: 98 F (36.7 C)  Resp: 20     Body mass index is 24.28 kg/(m^2).    ECOG FS: 0 Filed Weights   06/30/14 1416  Weight: 145 lb 14.4 oz (66.18 kg)   Sclerae unicteric, pupils equal and reactive Oropharynx clear, teeth in good repair No cervical or supraclavicular adenopathy Lungs no rales or rhonchi Heart regular rate and rhythm Abd soft, nontender, positive bowel sounds MSK no focal spinal tenderness, no upper extremity lymphedema Neuro: nonfocal, well oriented,  appropriate affect Breasts: I do not palpate any masses in either breast. There are no skin or nipple changes of concern. Both axillae are benign.   LAB RESULTS: Lab Results  Component Value Date   WBC 4.4 06/23/2014   NEUTROABS 3.0 06/23/2014   HGB 13.3 06/23/2014   HCT 39.9 06/23/2014   MCV 92.8 06/23/2014   PLT 172 06/23/2014      Chemistry      Component Value Date/Time   NA 143 06/23/2014 0936   NA 137 01/22/2012 1106   K 4.1 06/23/2014 0936   K 4.3 01/22/2012 1106   CL 109* 12/14/2012 1253   CL 104 01/22/2012 1106   CO2 26 06/23/2014 0936   CO2 27 01/22/2012 1106   BUN 17.7 06/23/2014 0936   BUN 16 01/22/2012 1106   CREATININE 1.2* 06/23/2014 0936   CREATININE 1.15* 01/22/2012 1106  Component Value Date/Time   CALCIUM 9.6 06/23/2014 0936   CALCIUM 9.8 01/22/2012 1106   ALKPHOS 56 06/23/2014 0936   ALKPHOS 61 01/22/2012 1106   AST 33 06/23/2014 0936   AST 25 01/22/2012 1106   ALT 30 06/23/2014 0936   ALT 18 01/22/2012 1106   BILITOT 0.48 06/23/2014 0936   BILITOT 0.6 01/22/2012 1106       STUDIES: CLINICAL DATA: 63 year old female with history of left breast cancer status post lumpectomy 2013. Patient had 2 MR guided core biopsies of the right breast on 11/22/2013 which showed fibrocystic changes with calcifications, usual ductal hyperplasia and PASH and fibrocystic change with calcifications, usual ductal hyperplasia and PASH with no evidence of malignancy. Short-term interval followup MRI was recommended. Patient presents for a six-month follow-up right mammogram and ultrasound.  EXAM: DIGITAL DIAGNOSTIC RIGHT MAMMOGRAM WITH CAD  ULTRASOUND RIGHT BREAST  COMPARISON: With priors.  ACR Breast Density Category c: The breast tissue is heterogeneously dense, which may obscure small masses.  FINDINGS: No suspicious mass, malignant type microcalcifications or distortion detected in the right breast. There are 2 biopsy clips in  the lateral aspect of the right breast.  Mammographic images were processed with CAD.  On physical exam, I do not palpate a mass in the right breast.  Ultrasound is performed, showing normal tissue throughout the lateral aspect of the right breast. No solid or cystic mass, abnormal shadowing or distortion visualized.  IMPRESSION: No mammographic or sonographic evidence of malignancy in either breast.  RECOMMENDATION: Bilateral diagnostic mammogram in April of 2016 is recommended. Breast MRI in November of 2015 is recommended for the 2 areas of clumped non masslike enhancement seen on the prior MRI dated 11/11/2013. These areas were biopsied with MR guidance and a short-term interval followup to to ensure stability was recommended.  I have discussed the findings and recommendations with the patient. Results were also provided in writing at the conclusion of the visit. If applicable, a reminder letter will be sent to the patient regarding the next appointment.  BI-RADS CATEGORY 1: Negative.   Electronically Signed  By: Lillia Mountain M.D.  On: 04/18/2014 15:26  ASSESSMENT: 63 y.o.  Starling Manns woman   (1)  status post left lumpectomy 11/04/2011 for a pT2 pN0, stage IIA invasive ductal carcinoma, grade 2, strongly estrogen and progesterone receptor positive, HER-2 negative, with an MIB-1 of 20%.   (2)  completed radiation treatments June 2013    (3)  started tamoxifen mid-July 2013  (4) breast density category C  (a) status post breast MRI in April 2015 showing 2 abnormalities in the right breast; biopsy of both spots 11/19/2013 showed only fibrocystic PASH   (5) osteopenia with a T score of -1.5 at the left femoral neck 04/18/2014  (a) on vitamin D supplementation, weightbearing exercise program, and tamoxifen   PLAN:  Nikea is doing well from a breast cancer point of view, with no evidence of disease recurrence or activity. We discussed her osteopenia at  length. If she were not on tamoxifen we might consider alendronate for example, but since tamoxifen does help with the bone density, the plan will be for her to continue her vitamin D supplementation and increase her weight-bearing exercise (the plan is for her to walk for to 5 times a week at least 30-45 minutes each time). We will repeat a bone density in 2 years.  She has been on tamoxifen 2 years and we discussed the fact that at this point she can switched  to anastrozole and complete her antiestrogen therapy over 5 years. We discussed the possible toxicities, side effects and complications of this agent. After much discussion what she would really like to do is to continue tamoxifen for a total of 10 years. This is entirely reasonable.  She remains very concerned about breast density issues, that is very reluctant to undergo anymore MRIs, not only because of the cost, but because it resulted in false positives the first time. She has been getting letters from [facility] regarding her MRI, which was scheduled for November. She really would prefer not to do that.  She just had a mammogram with ultrasonography, which was very reassuring. I am very comfortable with her not having any further MRIs unless we find something on mammography that requires further evaluation. On the other hand I did encourage her to get tomography every time and she will have bilateral mammography with tomography again in April.  Accordingly she will see me again in May. At that point we will consider going on a once a year basis until she completes her 10 years of tamoxifen.  Chauncey Cruel, MD    06/30/2014

## 2014-07-01 NOTE — Addendum Note (Signed)
Addended by: Laureen Abrahams on: 07/01/2014 05:32 PM   Modules accepted: Orders, Medications

## 2014-07-04 ENCOUNTER — Other Ambulatory Visit: Payer: Self-pay | Admitting: *Deleted

## 2014-07-04 ENCOUNTER — Encounter: Payer: Self-pay | Admitting: *Deleted

## 2014-07-04 DIAGNOSIS — C50412 Malignant neoplasm of upper-outer quadrant of left female breast: Secondary | ICD-10-CM

## 2014-07-04 MED ORDER — TAMOXIFEN CITRATE 20 MG PO TABS: 20.0000 mg | ORAL_TABLET | Freq: Every day | ORAL | Status: DC

## 2014-07-04 NOTE — [facility] Oncology Navigator Note (Signed)
Patient called because she has had difficulty getting a prescription for Tamoxifen through her insurance company, [employer].  I spoke with Dr. Virgie Dad nurse who will have him sign a prescription to be faxed to [facility].  I called patient back with the number to call and check later today to make sure it is received.  I encouraged patient to call me back if [facility] does not receive the prescription or for any other needs.

## 2014-11-10 ENCOUNTER — Ambulatory Visit
Admission: RE | Admit: 2014-11-10 | Discharge: 2014-11-10 | Disposition: A | Discharge status: Home / Self Care | Payer: Medicare Other | Source: Ambulatory Visit | Attending: Oncology | Admitting: Oncology

## 2014-11-10 DIAGNOSIS — C50412 Malignant neoplasm of upper-outer quadrant of left female breast: Secondary | ICD-10-CM

## 2014-11-28 ENCOUNTER — Other Ambulatory Visit: Payer: Self-pay | Admitting: *Deleted

## 2014-11-28 DIAGNOSIS — C50412 Malignant neoplasm of upper-outer quadrant of left female breast: Secondary | ICD-10-CM

## 2014-11-29 ENCOUNTER — Other Ambulatory Visit (HOSPITAL_BASED_OUTPATIENT_CLINIC_OR_DEPARTMENT_OTHER): Payer: Medicare Other

## 2014-11-29 DIAGNOSIS — C50412 Malignant neoplasm of upper-outer quadrant of left female breast: Secondary | ICD-10-CM

## 2014-11-29 DIAGNOSIS — C50512 Malignant neoplasm of lower-outer quadrant of left female breast: Secondary | ICD-10-CM | POA: Diagnosis present

## 2014-11-29 LAB — COMPREHENSIVE METABOLIC PANEL (CC13)
ALK PHOS: 59 U/L (ref 40–150)
ALT: 25 U/L (ref 0–55)
ANION GAP: 10 meq/L (ref 3–11)
AST: 27 U/L (ref 5–34)
Albumin: 3.8 g/dL (ref 3.5–5.0)
BILIRUBIN TOTAL: 0.4 mg/dL (ref 0.20–1.20)
BUN: 16.7 mg/dL (ref 7.0–26.0)
CHLORIDE: 106 meq/L (ref 98–109)
CO2: 26 mEq/L (ref 22–29)
CREATININE: 1 mg/dL (ref 0.6–1.1)
Calcium: 9.1 mg/dL (ref 8.4–10.4)
EGFR: 57 mL/min/{1.73_m2} — AB (ref >90)
GLUCOSE: 91 mg/dL (ref 70–140)
Potassium: 4.1 mEq/L (ref 3.5–5.1)
Sodium: 142 mEq/L (ref 136–145)
Total Protein: 6.7 g/dL (ref 6.4–8.3)

## 2014-11-29 LAB — CBC WITH DIFFERENTIAL/PLATELET
BASO%: 0.3 % (ref 0.0–2.0)
Basophils Absolute: 0 10*3/uL (ref 0.0–0.1)
EOS ABS: 0.1 10*3/uL (ref 0.0–0.5)
EOS%: 1.4 % (ref 0.0–7.0)
HCT: 40.6 % (ref 34.8–46.6)
HEMOGLOBIN: 13.4 g/dL (ref 11.6–15.9)
LYMPH#: 1 10*3/uL (ref 0.9–3.3)
LYMPH%: 16.3 % (ref 14.0–49.7)
MCH: 30.7 pg (ref 25.1–34.0)
MCHC: 33 g/dL (ref 31.5–36.0)
MCV: 92.9 fL (ref 79.5–101.0)
MONO#: 0.5 10*3/uL (ref 0.1–0.9)
MONO%: 7.4 % (ref 0.0–14.0)
NEUT%: 74.6 % (ref 38.4–76.8)
NEUTROS ABS: 4.7 10*3/uL (ref 1.5–6.5)
Platelets: 177 10*3/uL (ref 145–400)
RBC: 4.37 10*6/uL (ref 3.70–5.45)
RDW: 13.5 % (ref 11.2–14.5)
WBC: 6.3 10*3/uL (ref 3.9–10.3)

## 2014-12-05 NOTE — Progress Notes (Signed)
[name] C    12/06/2014  ID: [name]   DOB: Nov 20, 1950  MR#: [id number]  LPF#:[id number]   PCP: Jennifer Center, MD SU: [name] GYN: [name] OTHER MD: [name]  CHIEF COMPLAINT: Estrogen receptor positive breast cancer  CURRENT TREATMENT: Tamoxifen  BREAST CANCER HISTORY: From the original intake note:  The patient had routine screening mammography at [facility] 08/27/2011, suggesting a potential abnormality in the left breast. Additional views 08/29/2011 confirmed an irregular mass with architectural or distortion measuring up to 4 cm in the left breast, without associated microcalcifications. By ultrasound this was irregular, and showed posterior shadowing. It measured 3.6 cm sonographically. There were no suspicious lymph nodes noted in the left axilla.  The same day the patient underwent ultrasound-guided biopsy of the left breast mass, showing ([id number]) and invasive ductal carcinoma, grade 1, which was 100% estrogen receptor and 97% progesterone receptor positive. The proliferation marker was 20%. There was no HER-2 amplification with a ratio by CISH of 1.09.  The patient had bilateral breast MRI 09/03/2011 showing a solitary 3.8 cm left breast lesion, and on 11/04/2011 underwent left lumpectomy and sentinel lymph node sampling with results and subsequent history as detailed below.   INTERVAL HISTORY: [name] today for follow-up of her breast care. She is doing generally very well. "I'm fine, and boring". She found that her insurance was sending her a different form of generic tamoxifen and it seemed to cause her more hot flashes. She likes the "[name]" brand of the generic tamoxifen better. She is going to won't shop around" for that. She doesn't have vaginal wetness or other side effects from that drug. She does obtain ETA practically no cost  REVIEW OF SYSTEMS:  She is exercising at least 4 times a week at least 55 minutes each time. 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 Even her pearls irritate her neck though. Aside from that a detailed review of systems today was entirely noncontributory  PAST MEDICAL HISTORY: Past Medical History  Diagnosis Date  . Hyperlipidemia   . Breastr cancer, IDC, Left UOQ, clinical stage II Receptor +, Her 2 - 08/29/2011  . Hypothyroidism   . Osteoarthritis     PAST SURGICAL HISTORY: Past Surgical History  Procedure Laterality Date  . Ectopic pregnancy surgery  1986-88    s/p unilateral salpingectomy  . Bunionectomy  2000    Left  . Wisdom tooth extraction    . Breast lumpectomy  11/04/11    FAMILY HISTORY Family History  Problem Relation Age of Onset  . Heart disease Mother   . Heart disease Father   . Cancer Maternal Aunt     ovarian  . Heart disease Paternal Uncle    the patient's father died at the age of 64 from myocardial infarction. The patient's mother died from congestive heart failure at the age of 6. The patient has one brother age 3 with prostate cancer diagnosed at age 6. The patient's sister is 29 as of 2013. There is no breast or ovarian cancer in the family  GYNECOLOGIC HISTORY:  (Reviewed 12/27/2013) She had menarche at around age 63. She is GX P1, first pregnancy to term age 80. She stopped having periods about 2003. 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 Her husband [name]  has had prostate cancer, status post radiation treatments in [name] about 2 years ago. Her daughter [name] lives in [name]. She has 2 children. 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 The patient attends the [name].   ADVANCED DIRECTIVES: in place  HEALTH MAINTENANCE: (Updated 12/27/2013) History  Substance Use Topics   . Smoking status: Never Smoker   . Smokeless tobacco: Never Used  . Alcohol Use: Yes     Colonoscopy: repeat due 2015  PAP: UTD [name])  Bone density: June 2013, osteopenia with T score -1.4  Lipid panel: June 2014  Allergies  Allergen Reactions  . Oxycodone Anxiety  . Epinephrine     Sever headaches  . Other     Hospital gown  . Iodine Rash  . Nickel Rash    Rash and blisters  . Penicillins Rash    Rash and blisters  . Sulfa Drugs Cross Reactors Rash    Current Outpatient Prescriptions  Medication Sig Dispense Refill  . atorvastatin (LIPITOR) 10 MG tablet Take 10 mg by mouth. 2 TIMES A WEEK    . B Complex-C (SUPER B COMPLEX PO) Take by mouth.    [name] CALCIUM PO Take 700 mg by mouth daily.    . Cholecalciferol (VITAMIN D-3 PO) Take 2,000 Units by mouth daily.    [name] emollient (BIAFINE) cream Apply topically as needed.    [name] levothyroxine (SYNTHROID, LEVOTHROID) 88 MCG tablet Take 88 mcg by mouth every other day.    . Magnesium 400 MG CAPS Take by mouth daily.    . Multiple Vitamin (MULTIVITAMIN) capsule Take 1 capsule by mouth daily.    . tamoxifen (NOLVADEX) 20 MG tablet Take 1 tablet (20 mg total) by mouth daily. 90 tablet 3  . vitamin E (VITAMIN E) 400 UNIT capsule Take 400 Units by mouth daily.     No current facility-administered medications for this visit.    OBJECTIVE: Middle-aged white woman who appears younger than stated age 17 Vitals:   12/06/14 1433  BP: 113/70  Pulse: 74  Temp: 98.1 F (36.7 C)  Resp: 18     Body mass index is 24.89 kg/(m^2).    ECOG FS: 0 Filed Weights   12/06/14 1433  Weight: 149 lb 9.6 oz (67.858 kg)   Sclerae unicteric, EOMs intact Oropharynx clear, dentition in good repair No cervical or supraclavicular adenopathy Lungs no rales or rhonchi Heart regular rate and rhythm Abd soft, nontender, positive bowel sounds MSK no focal spinal tenderness, no upper extremity lymphedema Neuro: nonfocal, well oriented,  appropriate affect Breasts: The right breast is unremarkable. The left breast is status post lumpectomy and radiation. There is no evidence of local recurrence. Left axilla is benign.  Skin: Minimal erythematous nonpalpable rash in a necklace distribution   LAB RESULTS: Lab Results  Component Value Date   WBC 6.3 11/29/2014   NEUTROABS 4.7 11/29/2014   HGB 13.4 11/29/2014   HCT 40.6 11/29/2014   MCV 92.9 11/29/2014   PLT 177 11/29/2014      Chemistry      Component Value Date/Time   NA 142 11/29/2014 1013   NA 137 01/22/2012 1106   K 4.1 11/29/2014 1013   K 4.3 01/22/2012 1106   CL 109* 12/14/2012 1253   CL 104 01/22/2012 1106   CO2 26 11/29/2014 1013   CO2 27 01/22/2012 1106   BUN 16.7 11/29/2014 1013   BUN 16  01/22/2012 1106   CREATININE 1.0 11/29/2014 1013   CREATININE 1.15* 01/22/2012 1106      Component Value Date/Time   CALCIUM 9.1 11/29/2014 1013   CALCIUM 9.8 01/22/2012 1106   ALKPHOS 59 11/29/2014 1013   ALKPHOS 61 01/22/2012 1106   AST 27 11/29/2014 1013   AST 25 01/22/2012 1106   ALT 25 11/29/2014 1013   ALT 18 01/22/2012 1106   BILITOT 0.40 11/29/2014 1013   BILITOT 0.6 01/22/2012 1106       STUDIES: Mm Diag Breast Tomo Bilateral  11/10/2014   CLINICAL DATA:  Left lumpectomy for breast cancer 2013.  EXAM: DIGITAL DIAGNOSTIC bilateral MAMMOGRAM WITH 3D TOMOSYNTHESIS AND CAD  COMPARISON:  [facility]  ACR Breast Density Category c: The breast tissue is heterogeneously dense, which may obscure small masses.  FINDINGS: Left lumpectomy changes and evidence of benign right needle biopsies are reidentified. No new abnormality is identified in either breast.  Mammographic images were processed with CAD.  IMPRESSION: No evidence for malignancy in either breast.  RECOMMENDATION: Diagnostic mammogram is suggested in 1 year. (Code:DM-B-01Y)  I have discussed the findings and recommendations with the patient. Results were also provided in writing at the conclusion of the  visit. If applicable, a reminder letter will be sent to the patient regarding the next appointment.  BI-RADS CATEGORY  2: Benign   Electronically Signed   By: Conchita Paris M.D.   On: 11/10/2014 10:03     ASSESSMENT: 64 y.o.  Starling Manns woman   (1)  status post left lumpectomy 11/04/2011 for a pT2 pN0, stage IIA invasive ductal carcinoma, grade 2, strongly estrogen and progesterone receptor positive, HER-2 negative, with an MIB-1 of 20%.   (2)  completed radiation treatments June 2013    (3)  started tamoxifen mid-July 2013  (4) breast density category C  (a) status post breast MRI in April 2015 showing 2 abnormalities in the right breast; biopsy of both spots 11/19/2013 showed only fibrocystic PASH   (5) osteopenia with a T score of -1.5 at the left femoral neck 04/18/2014  (a) on vitamin D supplementation, weightbearing exercise program, and tamoxifen   PLAN:  Shirleen is now 3 years out from her definitive surgery with no evidence of disease recurrence. She is tolerating tamoxifen well.  At this point I am very comfortable seeing her on a once a year basis. She will see our breast survivorship navigator next visit, a year from now, and then see me in 2 years. We will continue to alternate visits until she completes her 10 years of follow-up.  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 She should not wear any metal jewelry for the next 6 months at least. Cloth is fine and pearls probably are okay. After 6 months if the problem has completely resolved, she can slowly introduce metallic jewelry, starting with gold, which is likely to be the least allergenic. This can be a long-term problem however, and she just may need to "get into" a different type of adornment .  I have firmed her wonderful exercise program. She will call with any problems that may develop before her next visit here. Chauncey Cruel, MD    12/06/2014

## 2014-12-06 ENCOUNTER — Telehealth: Payer: Self-pay | Admitting: Oncology

## 2014-12-06 ENCOUNTER — Encounter: Payer: Self-pay | Admitting: *Deleted

## 2014-12-06 ENCOUNTER — Ambulatory Visit (HOSPITAL_BASED_OUTPATIENT_CLINIC_OR_DEPARTMENT_OTHER): Payer: Medicare Other | Admitting: Oncology

## 2014-12-06 VITALS — BP 113/70 | HR 74 | Temp 98.1°F | Resp 18 | Ht 65.0 in | Wt 149.6 lb

## 2014-12-06 DIAGNOSIS — Z170 Estrogen receptor positive status [ER+]: Secondary | ICD-10-CM | POA: Diagnosis not present

## 2014-12-06 DIAGNOSIS — L299 Pruritus, unspecified: Secondary | ICD-10-CM

## 2014-12-06 DIAGNOSIS — C50412 Malignant neoplasm of upper-outer quadrant of left female breast: Secondary | ICD-10-CM | POA: Diagnosis present

## 2014-12-06 DIAGNOSIS — M8580 Other specified disorders of bone density and structure, unspecified site: Secondary | ICD-10-CM | POA: Diagnosis not present

## 2014-12-06 DIAGNOSIS — R922 Inconclusive mammogram: Secondary | ICD-10-CM

## 2014-12-06 MED ORDER — TAMOXIFEN CITRATE 20 MG PO TABS: 20.0000 mg | ORAL_TABLET | Freq: Every day | ORAL | Status: DC

## 2014-12-06 NOTE — Telephone Encounter (Signed)
avs printed for patient,inbox to gretchen for survivorship  anne

## 2014-12-06 NOTE — [facility] Oncology Navigator Note (Signed)
Oncology Nurse Navigator Documentation  Oncology Nurse Navigator Flowsheets 12/06/2014  Navigator Encounter Type Other  Patient Visit Type Medonc  Treatment Phase Other - Patient at [facility] for follow up visit with Dr. [name].  Patient reports that she is doing very well and has no questions or concerns at this time.  Barriers/Navigation Needs No barriers at this time  Interventions None required  Time Spent with Patient 15

## 2015-07-18 ENCOUNTER — Other Ambulatory Visit: Payer: Self-pay | Admitting: *Deleted

## 2015-07-18 ENCOUNTER — Telehealth: Payer: Self-pay | Admitting: *Deleted

## 2015-07-18 DIAGNOSIS — C50412 Malignant neoplasm of upper-outer quadrant of left female breast: Secondary | ICD-10-CM

## 2015-07-18 DIAGNOSIS — R2232 Localized swelling, mass and lump, left upper limb: Secondary | ICD-10-CM

## 2015-07-18 NOTE — Telephone Encounter (Signed)
Pt called to this RN to state noted new " lump in my left armpit ", " like it is a cord "  Per discussion- [name] states area is adjacent to known area of surgery " but more under my arm "  Plan per discussion is to obtain an [place] and Mammo per protocol.  Of note pt will be going out of town on 1/11 for 1 week.  Order and POF sent with order for scans and follow up.

## 2015-07-19 ENCOUNTER — Telehealth: Payer: Self-pay | Admitting: Oncology

## 2015-07-19 NOTE — Telephone Encounter (Signed)
Spoke with patient re mammo/us 1/6 and f/u with HB 1/9.

## 2015-07-21 ENCOUNTER — Ambulatory Visit
Admission: RE | Admit: 2015-07-21 | Discharge: 2015-07-21 | Disposition: A | Discharge status: Home / Self Care | Payer: Medicare Other | Source: Ambulatory Visit | Attending: Oncology | Admitting: Oncology

## 2015-07-21 ENCOUNTER — Other Ambulatory Visit: Payer: Self-pay | Admitting: Oncology

## 2015-07-21 DIAGNOSIS — C50412 Malignant neoplasm of upper-outer quadrant of left female breast: Secondary | ICD-10-CM

## 2015-07-21 DIAGNOSIS — N6489 Other specified disorders of breast: Secondary | ICD-10-CM | POA: Diagnosis not present

## 2015-07-21 DIAGNOSIS — R2232 Localized swelling, mass and lump, left upper limb: Secondary | ICD-10-CM

## 2015-07-21 DIAGNOSIS — R922 Inconclusive mammogram: Secondary | ICD-10-CM | POA: Diagnosis not present

## 2015-07-24 ENCOUNTER — Encounter: Payer: Self-pay | Admitting: Nurse Practitioner

## 2015-07-24 ENCOUNTER — Ambulatory Visit (HOSPITAL_BASED_OUTPATIENT_CLINIC_OR_DEPARTMENT_OTHER): Payer: Medicare Other | Admitting: Nurse Practitioner

## 2015-07-24 ENCOUNTER — Telehealth: Payer: Self-pay | Admitting: Oncology

## 2015-07-24 VITALS — BP 127/61 | HR 73 | Temp 97.7°F | Resp 18 | Ht 65.0 in | Wt 153.6 lb

## 2015-07-24 DIAGNOSIS — M8580 Other specified disorders of bone density and structure, unspecified site: Secondary | ICD-10-CM | POA: Diagnosis not present

## 2015-07-24 DIAGNOSIS — C50912 Malignant neoplasm of unspecified site of left female breast: Secondary | ICD-10-CM

## 2015-07-24 DIAGNOSIS — C50412 Malignant neoplasm of upper-outer quadrant of left female breast: Secondary | ICD-10-CM

## 2015-07-24 NOTE — Progress Notes (Signed)
[name] [name]    07/24/2015  ID: [name]   DOB: 02-Apr-1951  MR#: [id number]  TDD#:[id number]   PCP: [name], MD SU: [name] GYN: Okey Dupre OTHER MD: Lance Morin  CHIEF COMPLAINT: Estrogen receptor positive breast cancer  CURRENT TREATMENT: Tamoxifen  BREAST CANCER HISTORY: From the original intake note:  The patient had routine screening mammography at [facility] 08/27/2011, suggesting a potential abnormality in the left breast. Additional views 08/29/2011 confirmed an irregular mass with architectural or distortion measuring up to 4 cm in the left breast, without associated microcalcifications. By ultrasound this was irregular, and showed posterior shadowing. It measured 3.6 cm sonographically. There were no suspicious lymph nodes noted in the left axilla.  The same day the patient underwent ultrasound-guided biopsy of the left breast mass, showing ([id number]) and invasive ductal carcinoma, grade 1, which was 100% estrogen receptor and 97% progesterone receptor positive. The proliferation marker was 20%. There was no HER-2 amplification with a ratio by CISH of 1.09.  The patient had bilateral breast MRI 09/03/2011 showing a solitary 3.8 cm left breast lesion, and on 11/04/2011 underwent left lumpectomy and sentinel lymph node sampling with results and subsequent history as detailed below.   INTERVAL HISTORY: [name] returns today for follow-up of her left breast cancer. She is here early due to an irregularity palpated to her left axilla. She had a mammogram and ultrasound performed on Friday, that were both negative for recurrent disease. She was told that the "cord-like" feature she palpated was likely due to muscles pulling on tendons and scar tissue. She has previously had 3 lymph nodes removed. She has been on tamoxifen since July 2013 and tolerate this well with no side effects that she is aware of.   REVIEW OF SYSTEMS:  A detailed review of systems today  was entirely noncontributory, except where noted above.   PAST MEDICAL HISTORY: Past Medical History  Diagnosis Date  . Hyperlipidemia   . Breastr cancer, IDC, Left UOQ, clinical stage II Receptor +, Her 2 - 08/29/2011  . Hypothyroidism   . Osteoarthritis     PAST SURGICAL HISTORY: Past Surgical History  Procedure Laterality Date  . Ectopic pregnancy surgery  1986-88    s/p unilateral salpingectomy  . Bunionectomy  2000    Left  . Wisdom tooth extraction    . Breast lumpectomy  11/04/11    FAMILY HISTORY Family History  Problem Relation Age of Onset  . Heart disease Mother   . Heart disease Father   . Cancer Maternal Aunt     ovarian  . Heart disease Paternal Uncle    the patient's father died at the age of 97 from myocardial infarction. The patient's mother died from congestive heart failure at the age of 54. The patient has one brother age 64 with prostate cancer diagnosed at age 41. The patient's sister is 80 as of 2013. There is no breast or ovarian cancer in the family  GYNECOLOGIC HISTORY:  (Reviewed 12/27/2013) She had menarche at around age 60. She is GX P1, first pregnancy to term age 45. She stopped having periods about 2003. 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 Her husband [name]  has had prostate cancer, status post radiation treatments in [place] about 2 years ago. Her daughter [name]  lives in [place]. She has 2 children. 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 The patient attends the [employer].   ADVANCED DIRECTIVES: in place  HEALTH MAINTENANCE: (Updated 12/27/2013) Social History  Substance Use Topics  . Smoking status: Never Smoker   . Smokeless tobacco: Never Used  . Alcohol Use: Yes     Colonoscopy: repeat due 2015  PAP:  UTD Tobie Poet)  Bone density: June 2013, osteopenia with T score -1.4  Lipid panel: June 2014  Allergies  Allergen Reactions  . Oxycodone Anxiety  . Epinephrine     Sever headaches  . Other     Hospital gown  . Iodine Rash  . Nickel Rash    Rash and blisters  . Penicillins Rash    Rash and blisters  . Sulfa Drugs Cross Reactors Rash    Current Outpatient Prescriptions  Medication Sig Dispense Refill  . atorvastatin (LIPITOR) 10 MG tablet Take 10 mg by mouth. Once a week on Wednesday.    . B Complex-C (SUPER B COMPLEX PO) Take by mouth.    [name] CALCIUM PO Take 700 mg by mouth daily.    . Cholecalciferol (VITAMIN D-3 PO) Take 2,000 Units by mouth daily.    [name] levothyroxine (SYNTHROID, LEVOTHROID) 88 MCG tablet Take 88 mcg by mouth daily before breakfast.     . Magnesium 400 MG CAPS Take by mouth daily.    . Multiple Vitamin (MULTIVITAMIN) capsule Take 1 capsule by mouth daily.    . tamoxifen (NOLVADEX) 20 MG tablet Take 1 tablet (20 mg total) by mouth daily. 90 tablet 3  . vitamin E (VITAMIN E) 400 UNIT capsule Take 400 Units by mouth daily.     No current facility-administered medications for this visit.    OBJECTIVE: Middle-aged white woman who appears younger than stated age 22 Vitals:   07/24/15 1412  BP: 127/61  Pulse: 73  Temp: 97.7 F (36.5 C)  Resp: 18     Body mass index is 25.56 kg/(m^2).    ECOG FS: 0 Filed Weights   07/24/15 1412  Weight: 153 lb 9.6 oz (69.673 kg)   Skin: warm, dry  HEENT: sclerae anicteric, conjunctivae pink, oropharynx clear. No thrush or mucositis.  Lymph Nodes: No cervical or supraclavicular lymphadenopathy  Lungs: clear to auscultation bilaterally, no rales, wheezes, or rhonci  Heart: regular rate and rhythm  Abdomen: round, soft, non tender, positive bowel sounds  Musculoskeletal: No focal spinal tenderness, no peripheral edema  Neuro: non focal, well oriented, positive affect  Breasts: left breast status post  lumpectomy and radiation. No evidence of recurrent disease. No skin or nipple changes. Left axilla benign. No bulkiness palpated Right breast unremarkable.   LAB RESULTS: Lab Results  Component Value Date   WBC 6.3 11/29/2014   NEUTROABS 4.7 11/29/2014   HGB 13.4 11/29/2014   HCT 40.6 11/29/2014   MCV 92.9 11/29/2014   PLT 177 11/29/2014      Chemistry      Component Value Date/Time   NA 142 11/29/2014 1013   NA 137 01/22/2012 1106   K 4.1 11/29/2014 1013   K 4.3 01/22/2012 1106   CL 109* 12/14/2012 1253   CL 104 01/22/2012 1106   CO2 26 11/29/2014 1013   CO2 27 01/22/2012 1106   BUN 16.7 11/29/2014 1013   BUN 16 01/22/2012 1106   CREATININE 1.0 11/29/2014 1013   CREATININE 1.15* 01/22/2012 1106      Component Value Date/Time  CALCIUM 9.1 11/29/2014 1013   CALCIUM 9.8 01/22/2012 1106   ALKPHOS 59 11/29/2014 1013   ALKPHOS 61 01/22/2012 1106   AST 27 11/29/2014 1013   AST 25 01/22/2012 1106   ALT 25 11/29/2014 1013   ALT 18 01/22/2012 1106   BILITOT 0.40 11/29/2014 1013   BILITOT 0.6 01/22/2012 1106       STUDIES: US Breast Ltd Uni Left Inc Axilla  07/21/2015  CLINICAL DATA:  65 year old female with history of left breast cancer post lumpectomy and radiation therapy in 2013. Patient reports a palpable abnormality in the left axillary region just behind the lumpectomy site. EXAM: DIGITAL DIAGNOSTIC LEFT MAMMOGRAM WITH 3D TOMOSYNTHESIS AND CAD LEFT BREAST ULTRASOUND COMPARISON:  Previous exam(s). ACR Breast Density Category c: The breast tissue is heterogeneously dense, which may obscure small masses. FINDINGS: No suspicious masses or calcifications are seen in the left breast. Postsurgical changes are present in the far upper outer left breast at site of prior lumpectomy. A spot compression MLO tomosynthesis over the palpable area of concern in the far upper-outer left breast/ low left axilla was performed. No definite suspicious abnormalities are seen, only postsurgical  change. Mammographic images were processed with CAD. Physical examination at site of palpable concern in the left axilla reveals generalized firmness without a discrete underlying mass. Targeted ultrasound of the left axilla was performed. No discrete masses or abnormalities are seen, only normal appearing tissue is visualized. IMPRESSION: 1. No mammographic or sonographic correlate for the palpable abnormality in the far upper outer left breast/left axilla. 2. No mammographic evidence of malignancy in the left breast. RECOMMENDATION: 1. Recommend further management of the left axillary palpable abnormality be based on clinical assessment. 2. Recommend annual bilateral diagnostic mammography, for which the patient will be due April 2017. I have discussed the findings and recommendations with the patient. Results were also provided in writing at the conclusion of the visit. If applicable, a reminder letter will be sent to the patient regarding the next appointment. BI-RADS CATEGORY  2: Benign. Electronically Signed   By: Everlean Alstrom M.D.   On: 07/21/2015 15:50   Mm Diag Breast Tomo Uni Left  07/21/2015  CLINICAL DATA:  65 year old female with history of left breast cancer post lumpectomy and radiation therapy in 2013. Patient reports a palpable abnormality in the left axillary region just behind the lumpectomy site. EXAM: DIGITAL DIAGNOSTIC LEFT MAMMOGRAM WITH 3D TOMOSYNTHESIS AND CAD LEFT BREAST ULTRASOUND COMPARISON:  Previous exam(s). ACR Breast Density Category c: The breast tissue is heterogeneously dense, which may obscure small masses. FINDINGS: No suspicious masses or calcifications are seen in the left breast. Postsurgical changes are present in the far upper outer left breast at site of prior lumpectomy. A spot compression MLO tomosynthesis over the palpable area of concern in the far upper-outer left breast/ low left axilla was performed. No definite suspicious abnormalities are seen, only  postsurgical change. Mammographic images were processed with CAD. Physical examination at site of palpable concern in the left axilla reveals generalized firmness without a discrete underlying mass. Targeted ultrasound of the left axilla was performed. No discrete masses or abnormalities are seen, only normal appearing tissue is visualized. IMPRESSION: 1. No mammographic or sonographic correlate for the palpable abnormality in the far upper outer left breast/left axilla. 2. No mammographic evidence of malignancy in the left breast. RECOMMENDATION: 1. Recommend further management of the left axillary palpable abnormality be based on clinical assessment. 2. Recommend annual bilateral diagnostic mammography, for which the  patient will be due April 2017. I have discussed the findings and recommendations with the patient. Results were also provided in writing at the conclusion of the visit. If applicable, a reminder letter will be sent to the patient regarding the next appointment. BI-RADS CATEGORY  2: Benign. Electronically Signed   By: Everlean Alstrom M.D.   On: 07/21/2015 15:50     ASSESSMENT: 65 y.o.  Starling Manns woman   (1)  status post left lumpectomy 11/04/2011 for a pT2 pN0, stage IIA invasive ductal carcinoma, grade 2, strongly estrogen and progesterone receptor positive, HER-2 negative, with an MIB-1 of 20%.   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 I agree with what she was told at the [facility], with the irregularity she noticed being due to the muscles in that area  pulling on her axilla depending on the way she moves her arm. She is relieved to hear this. She is tolerating the tamoxifen well and will continue this for 10 years of antiestrogen therapy.   At this point we would move forward with alternating visits between Dr. [name] and the [facility]orship [facility] as he outlined in her last progress note, but she wants to see him one more time before this begins, given the circumstances. She will have a bilateral mammogram in April, and return to see him in May. At this point she will return to yearly alternating visits. She understands and agrees with this plan. She knows the goal of treatment in her case is cure. She has been encouraged to call with any issues that might arise before her next visit here.   Total time spent in appointment was 25 minutes, with greater than 50% of the time spent face to face with the patient.  Laurie Panda, NP  07/24/2015

## 2015-07-24 NOTE — Telephone Encounter (Signed)
Appointments made and avs printed for patient °

## 2015-08-08 ENCOUNTER — Telehealth: Payer: Self-pay | Admitting: *Deleted

## 2015-08-08 NOTE — Telephone Encounter (Signed)
  Oncology Nurse Navigator Documentation  Navigator Location: [facility] (08/08/15 1313) Navigator Encounter Type: Telephone (08/08/15 1313)  I called patient to check in.  She reports that she is doing well and denies any questions or needs at this time.  I encouraged her to call me for any assistance she may need.  Patient verbalized understanding.           Treatment Phase: Survivorship (08/08/15 1313) Barriers/Navigation Needs: No Questions;No Needs (08/08/15 1313)   Interventions: None required (08/08/15 1313)            Acuity: Level 1 (08/08/15 1313)         Time Spent with Patient: 15 (08/08/15 1313)

## 2015-08-29 DIAGNOSIS — E039 Hypothyroidism, unspecified: Secondary | ICD-10-CM | POA: Diagnosis not present

## 2015-09-05 DIAGNOSIS — E7801 Familial hypercholesterolemia: Secondary | ICD-10-CM | POA: Diagnosis not present

## 2015-09-05 DIAGNOSIS — E039 Hypothyroidism, unspecified: Secondary | ICD-10-CM | POA: Diagnosis not present

## 2015-09-07 DIAGNOSIS — H52223 Regular astigmatism, bilateral: Secondary | ICD-10-CM | POA: Diagnosis not present

## 2015-09-07 DIAGNOSIS — H5203 Hypermetropia, bilateral: Secondary | ICD-10-CM | POA: Diagnosis not present

## 2015-09-07 DIAGNOSIS — H25813 Combined forms of age-related cataract, bilateral: Secondary | ICD-10-CM | POA: Diagnosis not present

## 2015-10-02 DIAGNOSIS — J101 Influenza due to other identified influenza virus with other respiratory manifestations: Secondary | ICD-10-CM | POA: Diagnosis not present

## 2015-11-13 ENCOUNTER — Ambulatory Visit
Admission: RE | Admit: 2015-11-13 | Discharge: 2015-11-13 | Disposition: A | Discharge status: Home / Self Care | Payer: Medicare Other | Source: Ambulatory Visit | Attending: Nurse Practitioner | Admitting: Nurse Practitioner

## 2015-11-13 DIAGNOSIS — C50412 Malignant neoplasm of upper-outer quadrant of left female breast: Secondary | ICD-10-CM

## 2015-11-13 DIAGNOSIS — R922 Inconclusive mammogram: Secondary | ICD-10-CM | POA: Diagnosis not present

## 2015-11-21 ENCOUNTER — Ambulatory Visit (HOSPITAL_BASED_OUTPATIENT_CLINIC_OR_DEPARTMENT_OTHER): Payer: Medicare Other | Admitting: Oncology

## 2015-11-21 ENCOUNTER — Telehealth: Payer: Self-pay | Admitting: Oncology

## 2015-11-21 VITALS — BP 132/67 | HR 78 | Temp 97.5°F | Resp 18 | Ht 65.0 in | Wt 151.0 lb

## 2015-11-21 DIAGNOSIS — C50412 Malignant neoplasm of upper-outer quadrant of left female breast: Secondary | ICD-10-CM

## 2015-11-21 MED ORDER — TAMOXIFEN CITRATE 20 MG PO TABS: 20.0000 mg | ORAL_TABLET | Freq: Every day | ORAL | Status: DC

## 2015-11-21 NOTE — Progress Notes (Signed)
[name],[name]    11/21/2015  ID: [name]   DOB: Jun 03, 1951  MR#: [id number]  CSN#:[id number]   PCP: [name], MD SU: [name] GYN: Okey Dupre OTHER MD: Lance Morin  CHIEF COMPLAINT: Estrogen receptor positive breast cancer  CURRENT TREATMENT: Tamoxifen  BREAST CANCER HISTORY: From the original intake note:  The patient had routine screening mammography at [facility] 08/27/2011, suggesting a potential abnormality in the left breast. Additional views 08/29/2011 confirmed an irregular mass with architectural or distortion measuring up to 4 cm in the left breast, without associated microcalcifications. By ultrasound this was irregular, and showed posterior shadowing. It measured 3.6 cm sonographically. There were no suspicious lymph nodes noted in the left axilla.  The same day the patient underwent ultrasound-guided biopsy of the left breast mass, showing ([id number]) and invasive ductal carcinoma, grade 1, which was 100% estrogen receptor and 97% progesterone receptor positive. The proliferation marker was 20%. There was no HER-2 amplification with a ratio by CISH of 1.09.  The patient had bilateral breast MRI 09/03/2011 showing a solitary 3.8 cm left breast lesion, and on 11/04/2011 underwent left lumpectomy and sentinel lymph node sampling with results and subsequent history as detailed below.   INTERVAL HISTORY: [name] returns today for follow-up of her estrogen receptor positive breast cancer. She continues on tamoxifen, with excellent tolerance. She appears to be paying a little bit more than she needs to she is going to try to "shop it" a bit more.   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 She exercises with cancers size. She enjoys grandchildren. Overall he detailed review of systems today was noncontributory  PAST MEDICAL HISTORY: Past Medical History  Diagnosis Date  . Hyperlipidemia    . Breastr cancer, IDC, Left UOQ, clinical stage II Receptor +, Her 2 - 08/29/2011  . Hypothyroidism   . Osteoarthritis     PAST SURGICAL HISTORY: Past Surgical History  Procedure Laterality Date  . Ectopic pregnancy surgery  1986-88    s/p unilateral salpingectomy  . Bunionectomy  2000    Left  . Wisdom tooth extraction    . Breast lumpectomy  11/04/11    FAMILY HISTORY Family History  Problem Relation Age of Onset  . Heart disease Mother   . Heart disease Father   . Cancer Maternal Aunt     ovarian  . Heart disease Paternal Uncle    the patient's father died at the age of 64 from myocardial infarction. The patient's mother died from congestive heart failure at the age of 72. The patient has one brother age 75 with prostate cancer diagnosed at age 64. The patient's sister is 41 as of 2013. There is no breast or ovarian cancer in the family  GYNECOLOGIC HISTORY:  (Reviewed 12/27/2013) She had menarche at around age 51. She is GX P1, first pregnancy to term age 10. She stopped having periods about 2003. 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 Her husband [name]  has had prostate cancer, status post radiation treatments in [place] about 2 years ago. Her daughter [name] lives in [place]. She has 2 children. 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 The patient attends the [employer].   ADVANCED DIRECTIVES: in  place  HEALTH MAINTENANCE: (Updated 12/27/2013) Social History  Substance Use Topics  . Smoking status: Never Smoker   . Smokeless tobacco: Never Used  . Alcohol Use: Yes     Colonoscopy: repeat due 2015  PAP: UTD Tobie Poet)  Bone density: June 2013, osteopenia with T score -1.4  Lipid panel: June 2014  Allergies  Allergen Reactions   . Oxycodone Anxiety  . Epinephrine     Sever headaches  . Other     Hospital gown  . Iodine Rash  . Nickel Rash    Rash and blisters  . Penicillins Rash    Rash and blisters  . Sulfa Drugs Cross Reactors Rash    Current Outpatient Prescriptions  Medication Sig Dispense Refill  . atorvastatin (LIPITOR) 10 MG tablet Take 10 mg by mouth. Once a week on Wednesday.    . B Complex-[name] (SUPER B COMPLEX PO) Take by mouth.    [name] CALCIUM PO Take 700 mg by mouth daily.    . Cholecalciferol (VITAMIN D-3 PO) Take 2,000 Units by mouth daily.    [name] levothyroxine (SYNTHROID, LEVOTHROID) 88 MCG tablet Take 88 mcg by mouth daily before breakfast.     . Magnesium 400 MG CAPS Take by mouth daily.    . Multiple Vitamin (MULTIVITAMIN) capsule Take 1 capsule by mouth daily.    . tamoxifen (NOLVADEX) 20 MG tablet Take 1 tablet (20 mg total) by mouth daily. 90 tablet 3  . vitamin E (VITAMIN E) 400 UNIT capsule Take 400 Units by mouth daily.     No current facility-administered medications for this visit.    OBJECTIVE: Middle-aged white woman In no acute distress  Filed Vitals:   11/21/15 0956  BP: 132/67  Pulse: 78  Temp: 97.5 F (36.4 [name])  Resp: 18     Body mass index is 25.13 kg/(m^2).    ECOG FS: 0 Filed Weights   11/21/15 0956  Weight: 151 lb (68.493 kg)   Sclerae unicteric, pupils round and equal Oropharynx clear and moist-- no thrush or other lesions No cervical or supraclavicular adenopathy Lungs no rales or rhonchi Heart regular rate and rhythm Abd soft, nontender, positive bowel sounds MSK no focal spinal tenderness, no upper extremity lymphedema Neuro: nonfocal, well oriented, appropriate affect Breasts: The right breast is unremarkable. The left breast is status post lumpectomy and radiation. There is no evidence of local recurrence. The left axilla is benign.    LAB RESULTS: Lab Results  Component Value Date   WBC 6.3 11/29/2014   NEUTROABS 4.7 11/29/2014   HGB 13.4  11/29/2014   HCT 40.6 11/29/2014   MCV 92.9 11/29/2014   PLT 177 11/29/2014      Chemistry      Component Value Date/Time   NA 142 11/29/2014 1013   NA 137 01/22/2012 1106   K 4.1 11/29/2014 1013   K 4.3 01/22/2012 1106   CL 109* 12/14/2012 1253   CL 104 01/22/2012 1106   CO2 26 11/29/2014 1013   CO2 27 01/22/2012 1106   BUN 16.7 11/29/2014 1013   BUN 16 01/22/2012 1106   CREATININE 1.0 11/29/2014 1013   CREATININE 1.15* 01/22/2012 1106      Component Value Date/Time   CALCIUM 9.1 11/29/2014 1013   CALCIUM 9.8 01/22/2012 1106   ALKPHOS 59 11/29/2014 1013   ALKPHOS 61 01/22/2012 1106   AST 27 11/29/2014 1013   AST 25 01/22/2012 1106   ALT 25 11/29/2014 1013   ALT 18  01/22/2012 1106   BILITOT 0.40 11/29/2014 1013   BILITOT 0.6 01/22/2012 1106       STUDIES: Mm Diag Breast Tomo Bilateral  11/13/2015  CLINICAL DATA:  Annual examination. History of left breast cancer, status post lumpectomy in 2013. The patient is an asymptomatic. EXAM: 2D DIGITAL DIAGNOSTIC BILATERAL MAMMOGRAM WITH CAD AND ADJUNCT TOMO COMPARISON:  Previous exam(s). ACR Breast Density Category [name]: The breast tissue is heterogeneously dense, which may obscure small masses. FINDINGS: Stable lumpectomy changes in the deep upper outer left breast. No mass, nonsurgical distortion, or suspicious microcalcification is identified in either breast to suggest malignancy. Mammographic images were processed with CAD. IMPRESSION: No evidence of malignancy in either breast. Lumpectomy changes on the left. RECOMMENDATION: Diagnostic mammogram is suggested in 1 year. (Code:[id number]) I have discussed the findings and recommendations with the patient. Results were also provided in writing at the conclusion of the visit. If applicable, a reminder letter will be sent to the patient regarding the next appointment. BI-RADS CATEGORY  2: Benign. Electronically Signed   By: Curlene Dolphin M.D.   On: 11/13/2015 11:32     ASSESSMENT: 65  y.o.  Starling Manns woman   (1)  status post left lumpectomy 11/04/2011 for a pT2 pN0, stage IIA invasive ductal carcinoma, grade 2, strongly estrogen and progesterone receptor positive, HER-2 negative, with an MIB-1 of 20%.   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 This is very favorable.  She is tolerating tamoxifen without any unusual side effects. The plan is to continue that for a total of 10 years.  I suggested she consider designing a for our "graduate's". She will give that some thought.  At this point I am comfortable alternating appointments between her and our survivorship nurse practitioner, whom she will see next year. She will see me again to years from now. She will have her mammography and labs before each visit  Incidentally she will reminded me she was one of our first patients to do the breast fold technique for heart protection. She has very fond memories of Dr. Pablo Ledger and her radiation team.  Chauncey Cruel, MD  11/21/2015

## 2015-11-21 NOTE — Telephone Encounter (Signed)
appt made and avs printed °

## 2015-12-19 ENCOUNTER — Other Ambulatory Visit: Payer: Self-pay | Admitting: Oncology

## 2015-12-21 DIAGNOSIS — E039 Hypothyroidism, unspecified: Secondary | ICD-10-CM | POA: Diagnosis not present

## 2015-12-21 DIAGNOSIS — E7801 Familial hypercholesterolemia: Secondary | ICD-10-CM | POA: Diagnosis not present

## 2015-12-29 DIAGNOSIS — Z0000 Encounter for general adult medical examination without abnormal findings: Secondary | ICD-10-CM | POA: Diagnosis not present

## 2015-12-29 DIAGNOSIS — E781 Pure hyperglyceridemia: Secondary | ICD-10-CM | POA: Diagnosis not present

## 2015-12-29 DIAGNOSIS — C50412 Malignant neoplasm of upper-outer quadrant of left female breast: Secondary | ICD-10-CM | POA: Diagnosis not present

## 2015-12-29 DIAGNOSIS — E039 Hypothyroidism, unspecified: Secondary | ICD-10-CM | POA: Diagnosis not present

## 2015-12-29 DIAGNOSIS — J301 Allergic rhinitis due to pollen: Secondary | ICD-10-CM | POA: Diagnosis not present

## 2016-04-30 DIAGNOSIS — H6982 Other specified disorders of Eustachian tube, left ear: Secondary | ICD-10-CM | POA: Diagnosis not present

## 2016-08-05 DIAGNOSIS — L239 Allergic contact dermatitis, unspecified cause: Secondary | ICD-10-CM | POA: Diagnosis not present

## 2016-08-21 DIAGNOSIS — L239 Allergic contact dermatitis, unspecified cause: Secondary | ICD-10-CM | POA: Diagnosis not present

## 2016-08-21 DIAGNOSIS — R601 Generalized edema: Secondary | ICD-10-CM | POA: Diagnosis not present

## 2016-09-04 DIAGNOSIS — H5203 Hypermetropia, bilateral: Secondary | ICD-10-CM | POA: Diagnosis not present

## 2016-09-04 DIAGNOSIS — H25819 Combined forms of age-related cataract, unspecified eye: Secondary | ICD-10-CM | POA: Diagnosis not present

## 2016-09-04 DIAGNOSIS — H52221 Regular astigmatism, right eye: Secondary | ICD-10-CM | POA: Diagnosis not present

## 2016-10-01 DIAGNOSIS — M7989 Other specified soft tissue disorders: Secondary | ICD-10-CM | POA: Diagnosis not present

## 2016-10-01 DIAGNOSIS — M2550 Pain in unspecified joint: Secondary | ICD-10-CM | POA: Diagnosis not present

## 2016-10-01 DIAGNOSIS — R5383 Other fatigue: Secondary | ICD-10-CM | POA: Diagnosis not present

## 2016-10-01 DIAGNOSIS — M542 Cervicalgia: Secondary | ICD-10-CM | POA: Diagnosis not present

## 2016-10-01 DIAGNOSIS — Z79899 Other long term (current) drug therapy: Secondary | ICD-10-CM | POA: Diagnosis not present

## 2016-10-01 DIAGNOSIS — G629 Polyneuropathy, unspecified: Secondary | ICD-10-CM | POA: Diagnosis not present

## 2016-10-01 DIAGNOSIS — E663 Overweight: Secondary | ICD-10-CM | POA: Diagnosis not present

## 2016-10-01 DIAGNOSIS — Z6826 Body mass index (BMI) 26.0-26.9, adult: Secondary | ICD-10-CM | POA: Diagnosis not present

## 2016-10-09 ENCOUNTER — Other Ambulatory Visit: Payer: Self-pay | Admitting: Oncology

## 2016-10-09 DIAGNOSIS — Z853 Personal history of malignant neoplasm of breast: Secondary | ICD-10-CM

## 2016-10-10 ENCOUNTER — Encounter: Payer: Self-pay | Admitting: Neurology

## 2016-10-10 DIAGNOSIS — M7989 Other specified soft tissue disorders: Secondary | ICD-10-CM | POA: Diagnosis not present

## 2016-10-10 DIAGNOSIS — M2550 Pain in unspecified joint: Secondary | ICD-10-CM | POA: Diagnosis not present

## 2016-10-10 DIAGNOSIS — E663 Overweight: Secondary | ICD-10-CM | POA: Diagnosis not present

## 2016-10-10 DIAGNOSIS — Z6826 Body mass index (BMI) 26.0-26.9, adult: Secondary | ICD-10-CM | POA: Diagnosis not present

## 2016-10-10 DIAGNOSIS — M5030 Other cervical disc degeneration, unspecified cervical region: Secondary | ICD-10-CM | POA: Diagnosis not present

## 2016-10-30 DIAGNOSIS — Z6826 Body mass index (BMI) 26.0-26.9, adult: Secondary | ICD-10-CM | POA: Diagnosis not present

## 2016-10-30 DIAGNOSIS — R609 Edema, unspecified: Secondary | ICD-10-CM | POA: Diagnosis not present

## 2016-10-30 DIAGNOSIS — M792 Neuralgia and neuritis, unspecified: Secondary | ICD-10-CM | POA: Diagnosis not present

## 2016-10-31 DIAGNOSIS — R944 Abnormal results of kidney function studies: Secondary | ICD-10-CM | POA: Insufficient documentation

## 2016-11-12 DIAGNOSIS — E782 Mixed hyperlipidemia: Secondary | ICD-10-CM | POA: Diagnosis not present

## 2016-11-12 DIAGNOSIS — E039 Hypothyroidism, unspecified: Secondary | ICD-10-CM | POA: Diagnosis not present

## 2016-11-12 DIAGNOSIS — G629 Polyneuropathy, unspecified: Secondary | ICD-10-CM | POA: Diagnosis not present

## 2016-11-12 DIAGNOSIS — R208 Other disturbances of skin sensation: Secondary | ICD-10-CM | POA: Diagnosis not present

## 2016-11-12 DIAGNOSIS — C50412 Malignant neoplasm of upper-outer quadrant of left female breast: Secondary | ICD-10-CM | POA: Diagnosis not present

## 2016-11-12 DIAGNOSIS — R600 Localized edema: Secondary | ICD-10-CM | POA: Diagnosis not present

## 2016-11-13 DIAGNOSIS — R609 Edema, unspecified: Secondary | ICD-10-CM | POA: Diagnosis not present

## 2016-11-14 ENCOUNTER — Ambulatory Visit
Admission: RE | Admit: 2016-11-14 | Discharge: 2016-11-14 | Disposition: A | Discharge status: Home / Self Care | Payer: Medicare Other | Source: Ambulatory Visit | Attending: Oncology | Admitting: Oncology

## 2016-11-14 DIAGNOSIS — Z853 Personal history of malignant neoplasm of breast: Secondary | ICD-10-CM

## 2016-11-14 DIAGNOSIS — R928 Other abnormal and inconclusive findings on diagnostic imaging of breast: Secondary | ICD-10-CM | POA: Diagnosis not present

## 2016-11-19 ENCOUNTER — Encounter: Payer: Self-pay | Admitting: Adult Health

## 2016-11-19 ENCOUNTER — Encounter: Payer: Medicare Other | Admitting: Nurse Practitioner

## 2016-11-19 ENCOUNTER — Ambulatory Visit (HOSPITAL_BASED_OUTPATIENT_CLINIC_OR_DEPARTMENT_OTHER): Payer: Medicare Other | Admitting: Adult Health

## 2016-11-19 VITALS — BP 134/61 | HR 77 | Temp 97.7°F | Resp 18 | Ht 65.0 in | Wt 155.4 lb

## 2016-11-19 DIAGNOSIS — R609 Edema, unspecified: Secondary | ICD-10-CM | POA: Diagnosis not present

## 2016-11-19 DIAGNOSIS — M8580 Other specified disorders of bone density and structure, unspecified site: Secondary | ICD-10-CM | POA: Diagnosis not present

## 2016-11-19 DIAGNOSIS — Z170 Estrogen receptor positive status [ER+]: Secondary | ICD-10-CM | POA: Diagnosis not present

## 2016-11-19 DIAGNOSIS — C50412 Malignant neoplasm of upper-outer quadrant of left female breast: Secondary | ICD-10-CM | POA: Diagnosis not present

## 2016-11-19 NOTE — Progress Notes (Signed)
CLINIC:  Survivorship   REASON FOR VISIT:  Routine follow-up for history of breast cancer.   BRIEF ONCOLOGIC HISTORY:   (1)  status post left lumpectomy 11/04/2011 for a pT2 pN0, stage IIA invasive ductal carcinoma, grade 2, strongly estrogen and progesterone receptor positive, HER-2 negative, with an MIB-1 of 20%.   (2)  completed radiation treatments June 2013    (3)  started tamoxifen mid-July 2013, to be continued until 2023  (4) breast density category C             (a) status post breast MRI in April 2015 showing 2 abnormalities in the right breast; biopsy of both spots 11/19/2013 showed only fibrocystic PASH   (5) osteopenia with a T score of -1.5 at the left femoral neck 04/18/2014             (a) on vitamin D supplementation, weightbearing exercise program, and tamoxifen    INTERVAL HISTORY:  Ms. Silveri presents to the [facility] today for routine follow-up for her history of breast cancer.  Overall, she reports feeling moderately well.  She has noted an increase in edema in her legs and hands.  It was more significant last week and her PCP started Lasix and her swelling has improved.  An echo has been ordered.      REVIEW OF SYSTEMS:  Review of Systems  Constitutional: Negative for appetite change, chills, diaphoresis, fatigue, fever and unexpected weight change.  HENT:   Negative for hearing loss and lump/mass.   Eyes: Negative for eye problems and icterus.  Respiratory: Negative for chest tightness and cough.   Cardiovascular: Positive for leg swelling. Negative for chest pain and palpitations.  Gastrointestinal: Negative for abdominal distention, abdominal pain, constipation and diarrhea.  Endocrine: Negative for hot flashes.  Neurological: Negative for dizziness, headaches and light-headedness.  Hematological: Negative for adenopathy. Does not bruise/bleed easily.  Psychiatric/Behavioral: Negative for depression. The patient is not  nervous/anxious.   Breast: Denies any new nodularity, masses, tenderness, nipple changes, or nipple discharge.     PAST MEDICAL/SURGICAL HISTORY:  Past Medical History:  Diagnosis Date  . Breastr cancer, IDC, Left UOQ, clinical stage II Receptor +, Her 2 - 08/29/2011  . Hyperlipidemia   . Hypothyroidism   . Osteoarthritis    Past Surgical History:  Procedure Laterality Date  . BREAST LUMPECTOMY  11/04/11  . BUNIONECTOMY  2000   Left  . ECTOPIC PREGNANCY SURGERY  1986-88   s/p unilateral salpingectomy  . WISDOM TOOTH EXTRACTION       ALLERGIES:  Allergies  Allergen Reactions  . Epinephrine Other (See Comments)    Sever headaches  . Oxycodone Anxiety  . Iodine Rash  . Nickel Rash    Rash and blisters  . Other Rash    Hospital gown  . Penicillins Rash    Rash and blisters  . Sulfa Drugs Cross Reactors Rash     CURRENT MEDICATIONS:  Outpatient Encounter Prescriptions as of 11/19/2016  Medication Sig Note  . B Complex-C (SUPER B COMPLEX PO) Take by mouth.   . furosemide (LASIX) 20 MG tablet Take 20 mg by mouth daily.  11/19/2016: Pt. Stated, "I'm taking half a tablet a day."  . levothyroxine (SYNTHROID, LEVOTHROID) 88 MCG tablet Take 88 mcg by mouth daily before breakfast.    . Magnesium 400 MG CAPS Take by mouth daily.   . Multiple Vitamin (MULTIVITAMIN) capsule Take 1 capsule by mouth daily.   . tamoxifen (NOLVADEX) 20 MG tablet  TAKE 1 TABLET BY MOUTH DAILY   . [DISCONTINUED] atorvastatin (LIPITOR) 10 MG tablet Take 10 mg by mouth. Once a week on Wednesday.   . [DISCONTINUED] CALCIUM PO Take 700 mg by mouth daily.   . [DISCONTINUED] Cholecalciferol (VITAMIN D-3 PO) Take 2,000 Units by mouth daily.   . [DISCONTINUED] vitamin E (VITAMIN E) 400 UNIT capsule Take 400 Units by mouth daily.    No facility-administered encounter medications on file as of 11/19/2016.      ONCOLOGIC FAMILY HISTORY:  Family History  Problem Relation Age of Onset  . Heart disease Mother   .  Heart disease Father   . Cancer Maternal Aunt     ovarian  . Heart disease Paternal Uncle     GENETIC COUNSELING/TESTING: Not done  SOCIAL HISTORY:  ANALIS DISTLER is married and lives with her husband in [place], [place].  She has 1 daughterand they are in the process of moving to [place].  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  She denies any current or history of tobacco, alcohol, or illicit drug use.     PHYSICAL EXAMINATION:  Vital Signs: Vitals:   11/19/16 1245  BP: 134/61  Pulse: 77  Resp: 18  Temp: 97.7 F (36.5 C)   Filed Weights   11/19/16 1245  Weight: 155 lb 6.4 oz (70.5 kg)   General: Well-nourished, well-appearing female in no acute distress.  Unaccompanied today.   HEENT: Head is normocephalic.  Pupils equal and reactive to light. Conjunctivae clear without exudate.  Sclerae anicteric. Oral mucosa is pink, moist.  Oropharynx is pink without lesions or erythema.  Lymph: No cervical, supraclavicular, or infraclavicular lymphadenopathy noted on palpation.  Cardiovascular: Regular rate and rhythm.[name] Respiratory: Clear to auscultation bilaterally. Chest expansion symmetric; breathing non-labored.  Breast Exam:  -Left breast: No appreciable masses on palpation. No skin redness, thickening, or peau d'orange appearance; no nipple retraction or nipple discharge; mild distortion in symmetry at previous lumpectomy site well healed scar without erythema or nodularity.  -Right breast: No appreciable masses on palpation. No skin redness, thickening, or peau d'orange appearance; no nipple retraction or nipple discharge;  -Axilla: No axillary adenopathy bilaterally.  GI: Abdomen soft and round; non-tender, non-distended. Bowel sounds normoactive. No hepatosplenomegaly.   GU: Deferred.  Neuro: No focal deficits. Steady gait.  Psych: Mood and affect normal and appropriate for situation.  Extremities: No edema. Skin: Warm and  dry.  LABORATORY DATA:  None for this visit   DIAGNOSTIC IMAGING:  Most recent mammogram:      ASSESSMENT AND PLAN:  Ms.. Pietro is a pleasant 66 y.o. female with history of Stage IIA left breast invasive ductal carcinoma, ER+/PR+/HER2-, diagnosed in 2013, treated with lumpectomy, adjuvant radiation therapy, and anti-estrogen therapy with Tamoxifen beginning in 2013.  She presents to the [facility] for surveillance and routine follow-up.   1. History of breast cancer:  Ms. Szczesny is currently clinically and radiographically without evidence of disease or recurrence of breast cancer. She will be due for mammogram in 11/2017; orders placed today.  She will continue her anti-estrogen therapy with Tamoxifen, with plans to continue for 10 years.  We did discuss her lower extremity swelling and if that is possibly related to her Tamoxifen.  She may try a trial of a few weeks off of Tamoxifen to see if it is related.  We also discussed BCI testing to see if extending therapy is even indeed necessary.  I gave her  informatip  I encouraged her to call me with any questions or concerns before her next visit at the cancer center, and I would be happy to see her sooner, if needed.    2. Bone health:  Given Ms. Aguilera's age, history of breast cancer, she is at slight risk for bone demineralization. Her last DEXA scan was in 2015 and was consistent with osteopenia.  She was encouraged to continue with her consumption of foods rich in calcium, as well as perform her weight-bearing activities.  She was given education on specific food and activities to promote bone health.  3. Cancer screening:  Due to Ms. Krawiec's history and her age, she should receive screening for skin cancers, colon cancer, and gynecologic cancers. She was encouraged to follow-up with her PCP for appropriate cancer screenings.   4. Health maintenance and wellness promotion: Ms. Whitelock was encouraged to consume 5-7  servings of fruits and vegetables per day. She was also encouraged to engage in moderate to vigorous exercise for 30 minutes per day most days of the week. She was instructed to limit her alcohol consumption and continue to abstain from tobacco use.    Dispo:  -Return to cancer center in one year for LTS follow up   A total of (30) minutes of face-to-face time was spent with this patient with greater than 50% of that time in counseling and care-coordination.   Gardenia Phlegm, NP Survivorship Program [facility] [phone]   Note: PRIMARY CARE PROVIDER [place], [place] [phone]

## 2016-11-20 DIAGNOSIS — R600 Localized edema: Secondary | ICD-10-CM | POA: Diagnosis not present

## 2016-11-29 DIAGNOSIS — R208 Other disturbances of skin sensation: Secondary | ICD-10-CM | POA: Diagnosis not present

## 2016-11-29 DIAGNOSIS — R600 Localized edema: Secondary | ICD-10-CM | POA: Diagnosis not present

## 2016-11-29 DIAGNOSIS — G629 Polyneuropathy, unspecified: Secondary | ICD-10-CM | POA: Diagnosis not present

## 2016-12-16 ENCOUNTER — Ambulatory Visit (INDEPENDENT_AMBULATORY_CARE_PROVIDER_SITE_OTHER): Payer: Medicare Other | Admitting: Neurology

## 2016-12-16 ENCOUNTER — Encounter: Payer: Self-pay | Admitting: Neurology

## 2016-12-16 VITALS — BP 110/70 | HR 94 | Ht 65.0 in | Wt 155.2 lb

## 2016-12-16 DIAGNOSIS — R292 Abnormal reflex: Secondary | ICD-10-CM

## 2016-12-16 DIAGNOSIS — R29898 Other symptoms and signs involving the musculoskeletal system: Secondary | ICD-10-CM

## 2016-12-16 DIAGNOSIS — G959 Disease of spinal cord, unspecified: Secondary | ICD-10-CM

## 2016-12-16 NOTE — Progress Notes (Signed)
[facility] Clinic Note - Initial Visit   Date: 12/16/16  [name] MRN: [id number] DOB: 12/03/1950   Dear Dr. Amil Amen:  Thank you for your kind referral of [name] for consultation of hand pain and neuropathy. Although her history is well known to you, please allow [place] to reiterate it for the purpose of our medical record. The patient was accompanied to the clinic by husband who also provides collateral information.     History of Present Illness: [name] is a 66 y.o. right-handed Caucasian female with history of left breast cancer s/p lumpectomy and radiation (2013), hyperlipidemia, and hypothyroidiam presenting for evaluation of bilateral hand pain.    Starting in September 2017, she took a [place] in [place] and when she returned, she developed a scaly and itchy rash on her hands.  She tried a number of ointments which did not help and saw her dermatologist in January who recommended topical steroid cream, but she did not tolerate steroids so it was stopped.  She was referred to see Dr. Amil Amen and during that time, she started having new swelling and increased sensitivity of the hands.  She also noticed white discoloration of the fingertips. Since March, she has daily swelling of the hand and tingling and prickly sensation over the fingers.  Around the same time, she developed weakness of the hands and she reports being unable to make a fist or extend her fingers. She has not had any rash since March 2018.  Over the past few days, she has new itching sensation over the wrists.  She complains of neck pain and feels better with icing her neck.  She has been seeing a chiropractor, with no improvement.  In addition to dermatology and rheumatology, she was seeing [facility] in [place] who checked heavy metals and thyroid.   She was told her mercury was likely elevated due to dental fillings or that she eats a lot  of fish.  She has also had echo and EKG which was normal as part of her work-up for swelling.    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    She reports having history of neck injury many years ago which was treated conservatively.    Out-side paper records, electronic medical record, and images have been reviewed where available and summarized as:  Labs 08/21/2016:  ANA, CMP normal Labs 10/01/2016:  CBC, RF, ESR 2, CRP 1.7, CCP, ENA, vitamin B12 821, folate >20, TSH 3.55, Hep B and C normal  Past Medical History:  Diagnosis Date  . Breastr cancer, IDC, Left UOQ, clinical stage II Receptor +, Her 2 - 08/29/2011  . Hyperlipidemia   . Hypothyroidism   . Osteoarthritis     Past Surgical History:  Procedure Laterality Date  . BREAST LUMPECTOMY  11/04/11  . BUNIONECTOMY  2000   Left  . ECTOPIC PREGNANCY SURGERY  1986-88   s/p unilateral salpingectomy  . WISDOM TOOTH EXTRACTION       Medications:  Outpatient Encounter Prescriptions as of 12/16/2016  Medication Sig Note  . B Complex-C (SUPER B COMPLEX PO) Take by mouth.   . furosemide (LASIX) 20 MG tablet Take 20 mg by  mouth daily.  11/19/2016: Pt. Stated, "I'm taking half a tablet a day."  . levothyroxine (SYNTHROID, LEVOTHROID) 88 MCG tablet Take 88 mcg by mouth daily before breakfast.    . Magnesium 400 MG CAPS Take by mouth daily.   . Multiple Vitamin (MULTIVITAMIN) capsule Take 1 capsule by mouth daily.   [name] NALTREXONE HCL PO Take 2 mg by mouth at bedtime.   . tamoxifen (NOLVADEX) 20 MG tablet TAKE 1 TABLET BY MOUTH DAILY   . Turmeric Curcumin 500 MG CAPS Take by mouth.    No facility-administered encounter medications on file as of 12/16/2016.       Allergies:  Allergies  Allergen Reactions  . Epinephrine Other (See Comments)    Sever headaches  . Oxycodone Anxiety  . Iodine Rash  . Nickel Rash    Rash and blisters  . Other Rash    Hospital gown  . Penicillins Rash    Rash and blisters  . Sulfa Drugs Cross Reactors Rash    Family History: Family History  Problem Relation Age of Onset  . Heart disease Mother   . Heart failure Mother   . Heart disease Father   . Cancer Maternal Aunt        ovarian  . Heart disease Paternal Uncle   . Cancer Sister   . Heart disease Brother     Social History: Social History  Substance Use Topics  . Smoking status: Never Smoker  . Smokeless tobacco: Never Used  . Alcohol use Yes     Comment: Drinks one mixed drink nightly   Social History   Social History Narrative   Lives with husband in a 2 story home.  Has 1 daughter.     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     Education: college.     Review of Systems:  CONSTITUTIONAL: No fevers, chills, night sweats, or weight loss.   EYES: No visual changes or eye pain ENT: No hearing changes.  No history of nose bleeds.   RESPIRATORY: No cough, wheezing and shortness of breath.   CARDIOVASCULAR: Negative for chest pain, and palpitations.   GI: Negative for abdominal discomfort, blood in stools or black stools.  No recent change in bowel habits.   GU:  No history of incontinence.   MUSCLOSKELETAL: +history of joint pain +swelling.  No myalgias.   SKIN: + lesions, + rash, +itching.   HEMATOLOGY/ONCOLOGY: Negative for prolonged bleeding, bruising easily, and swollen nodes.  +history of cancer.   ENDOCRINE: Negative for cold or heat intolerance, polydipsia or goiter.   PSYCH:  No depression or anxiety symptoms.   NEURO: As Above.   Vital Signs:  BP 110/70   Pulse 94   Ht _0  (1.651 m)   Wt 155 lb 3 oz (70.4 kg)   SpO2 95%   BMI 25.82 kg/m    General Medical Exam:   General:  Well appearing, comfortable.    Eyes/ENT: see cranial nerve examination.   Neck: No masses appreciated.  Full range of motion without tenderness.  No carotid bruits. Respiratory:  Clear to auscultation, good air entry bilaterally.   Cardiac:  Regular rate and rhythm, no murmur.   Extremities: There is mild swelling over the hands and feet.   Skin:  No rashes or lesions.  Neurological Exam: MENTAL STATUS including orientation to time, place, person, recent and remote memory, attention span and concentration, language, and fund of knowledge is normal.  Speech is not dysarthric.  CRANIAL NERVES:  II:  No visual field defects.  Unremarkable fundi.   III-IV-VI: Pupils equal round and reactive to light.  Normal conjugate, extra-ocular eye movements in all directions of gaze.  No nystagmus.  No ptosis.   V:  Normal facial sensation.  Jaw jerk is absent.   VII:  Normal facial symmetry and movements.  Left palmomental reflex is present.  Snout is absent.   VIII:  Normal hearing and vestibular function.   IX-X:  Normal palatal movement.   XI:  Normal shoulder shrug and head rotation.   XII:  Normal tongue strength and range of motion, no deviation or fasciculation.  MOTOR:  Marked right ADM and interosseus muscle atrophy bilaterally, worse on the right.  No fasciculations or abnormal movements.  No pronator drift.  Tone is normal.    Right Upper Extremity:    Left Upper Extremity:    Deltoid  5/5   Deltoid  5/5   Biceps  5/5   Biceps  5/5   Triceps  5/5   Triceps  5/5   Wrist extensors  5/5   Wrist extensors  5/5   Wrist flexors  4+/5   Wrist flexors  4+/5   Finger extensors  4/5   Finger extensors  4/5   Finger flexors  5-/5   Finger flexors  5-/5   Dorsal interossei  4/5   Dorsal interossei  4/5   Abductor pollicis  5-/5   Abductor pollicis  5-/5   Tone (Ashworth scale)  0  Tone (Ashworth scale)  0   Right Lower Extremity:    Left Lower Extremity:    Hip flexors  5/5   Hip flexors  5/5   Hip extensors  5/5   Hip  extensors  5/5   Knee flexors  5/5   Knee flexors  5/5   Knee extensors  5/5   Knee extensors  5/5   Dorsiflexors  5/5   Dorsiflexors  5/5   Plantarflexors  5/5   Plantarflexors  5/5   Toe extensors  5/5   Toe extensors  5/5   Toe flexors  5/5   Toe flexors  5/5   Tone (Ashworth scale)  0  Tone (Ashworth scale)  0+   MSRs:  Right                                                                 Left brachioradialis 3+  brachioradialis 3+  biceps 3+  biceps 3+  triceps 3+  triceps 3+  patellar 3+  patellar 3+  ankle jerk 2+  ankle jerk 2+  Hoffman no  Hoffman no  plantar response down  plantar response down   SENSORY:  Vibration is reduced at the MCP as compared with the elbow and feet.  There is hyperesthesia with pin prick over the fingers and palm bilaterally.  Temperature and light touch intact. Sensation to all modalities intact in the legs. Romberg's sign absent.   COORDINATION/GAIT: Normal finger-to- nose-finger and heel-to-shin.  Intact rapid alternating movements bilaterally.  Able to rise from a chair without using arms.  Gait narrow based and stable. Tandem and stressed gait intact.    IMPRESSION: [name] is a 66 year-old female referred for evaluation of bilateral hand pain and paresthesias.  She has a constellation of symptoms which started in the fall of 2017 and includes skin rash, hand swelling, hand weakness, paresthesias, and Raynaud's.  Her autoimmune work-up has been nondiagnostic.  She is referred for evaluation of her hand paresthesias and weakness.  On exam, she has atrophy of the intrinsic hand muscles, weakness with finger abduction, extension, and wrist flexion.  Finger flexion is not as weak as I would have expected. There is also generalized hyperreflexia and distal sensory loss over the hands.  In combination, this exam findings are most suggestive of cervical myelopathy, moreso than a peripheral nerve etiology.  She will undergo both MRI cervical spine wwo  contrast and NCS/EMG of the hands to better characterize the nature of her symptoms. Based on the predominance of C8-innervated muscles, she may either have compressive myelopathy or spinal cord pathology at this level.  If there is evidence of spinal cord pathology, CSF testing will be the next step.    Further recommendations will be based on her results.    The duration of this appointment visit was 60 minutes of face-to-face time with the patient.  Greater than 50% of this time was spent in counseling, explanation of diagnosis, planning of further management, and coordination of care.   Thank you for allowing me to participate in patient's care.  If I can answer any additional questions, I would be pleased to do so.    Sincerely,    Birgit Nowling K. Posey Pronto, DO

## 2016-12-16 NOTE — Patient Instructions (Addendum)
MRI cervical spine wwo contrast NCS/EMG of the upper extremities  We will call you with the results and let you know the next step

## 2016-12-18 DIAGNOSIS — R21 Rash and other nonspecific skin eruption: Secondary | ICD-10-CM | POA: Diagnosis not present

## 2016-12-18 DIAGNOSIS — G629 Polyneuropathy, unspecified: Secondary | ICD-10-CM | POA: Diagnosis not present

## 2016-12-18 DIAGNOSIS — R208 Other disturbances of skin sensation: Secondary | ICD-10-CM | POA: Diagnosis not present

## 2016-12-18 DIAGNOSIS — R600 Localized edema: Secondary | ICD-10-CM | POA: Diagnosis not present

## 2016-12-25 ENCOUNTER — Ambulatory Visit
Admission: RE | Admit: 2016-12-25 | Discharge: 2016-12-25 | Disposition: A | Discharge status: Home / Self Care | Payer: Medicare Other | Source: Ambulatory Visit | Attending: Neurology | Admitting: Neurology

## 2016-12-25 DIAGNOSIS — R29898 Other symptoms and signs involving the musculoskeletal system: Secondary | ICD-10-CM

## 2016-12-25 DIAGNOSIS — R292 Abnormal reflex: Secondary | ICD-10-CM

## 2016-12-25 DIAGNOSIS — G959 Disease of spinal cord, unspecified: Secondary | ICD-10-CM

## 2016-12-25 DIAGNOSIS — M4802 Spinal stenosis, cervical region: Secondary | ICD-10-CM | POA: Diagnosis not present

## 2016-12-25 DIAGNOSIS — M50222 Other cervical disc displacement at C5-C6 level: Secondary | ICD-10-CM | POA: Diagnosis not present

## 2016-12-25 MED ORDER — GADOBENATE DIMEGLUMINE 529 MG/ML IV SOLN
14.0000 mL | Freq: Once | INTRAVENOUS | Status: AC | PRN
  Administered 2016-12-25: 14 mL via INTRAVENOUS

## 2016-12-26 ENCOUNTER — Ambulatory Visit (INDEPENDENT_AMBULATORY_CARE_PROVIDER_SITE_OTHER): Payer: Medicare Other | Admitting: Neurology

## 2016-12-26 DIAGNOSIS — G5603 Carpal tunnel syndrome, bilateral upper limbs: Secondary | ICD-10-CM

## 2016-12-26 DIAGNOSIS — R292 Abnormal reflex: Secondary | ICD-10-CM

## 2016-12-26 DIAGNOSIS — G959 Disease of spinal cord, unspecified: Secondary | ICD-10-CM

## 2016-12-26 NOTE — Procedures (Signed)
[facility]  [place], [place]  [place], [place] [address] Tel: [phone] Fax:  [phone] Test Date:  12/26/2016  Patient: [name] DOB: Apr 06, 1951 Physician: Narda Amber, DO  Sex: Female Height: 5\' 5"  Ref Phys: Narda Amber, DO  ID#: [id number] Temp: 33.3C Technician:    Patient Complaints: This is 66 year-old female referred for evaluation bilateral hand paresthesias, pain, and swelling.  NCV & EMG Findings: Extensive electrodiagnostic testing of the right upper extremity and additional studies of the left shows:  1. Bilateral median and ulnar sensory responses are within normal limits. Bilateral mixed palmar sensory responses are abnormal. 2. Bilateral median motor responses are within normal limits. Bilateral ulnar motor responses show mildly reduced amplitude, with normal latency and conduction velocity. 3. There is no evidence of active or chronic motor axon loss changes affecting any of the tested muscles. Motor unit configuration and recruitment pattern is within normal limits.   Impression: 1. Bilateral median neuropathy at or distal to the wrist, consistent with clinical diagnosis of carpal tunnel syndrome. Overall, these findings are very mild in degree electrically. 2. Chronic C8 radiculopathy affecting bilateral upper extremities, very mild in degree electrically.   ___________________________ Narda Amber, DO    Nerve Conduction Studies Anti Sensory Summary Table   Stim Site NR Peak (ms) Norm Peak (ms) P-T Amp (V) Norm P-T Amp  Left Median Anti Sensory (2nd Digit)  Wrist    3.6 <3.8 24.0 >10  Right Median Anti Sensory (2nd Digit)  33.3C  Wrist    3.3 <3.8 21.8 >10  Left Ulnar Anti Sensory (5th Digit)  Wrist    2.8 <3.2 32.1 >5  Right Ulnar Anti Sensory (5th Digit)  33.3C  Wrist    2.9 <3.2 28.5 >5   Motor Summary Table   Stim Site NR Onset (ms) Norm Onset (ms) O-P Amp (mV) Norm O-P Amp Site1 Site2 Delta-0 (ms) Dist (cm)  Vel (m/s) Norm Vel (m/s)  Left Median Motor (Abd Poll Brev)  Wrist    3.5 <4.0 5.7 >5 Elbow Wrist 4.7 28.0 60 >50  Elbow    8.2  5.7         Right Median Motor (Abd Poll Brev)  33.3C  Wrist    3.0 <4.0 5.6 >5 Elbow Wrist 4.7 29.0 62 >50  Elbow    7.7  5.5         Left Ulnar Motor (Abd Dig Minimi)  33.3C  Wrist    2.5 <3.1 6.4 >7 B Elbow Wrist 3.5 25.0 71 >50  B Elbow    6.0  6.3  A Elbow B Elbow 1.4 10.0 71 >50  A Elbow    7.4  5.8         Right Ulnar Motor (Abd Dig Minimi)  33.3C  Wrist    2.3 <3.1 6.8 >7 B Elbow Wrist 3.6 22.0 61 >50  B Elbow    5.9  6.2  A Elbow B Elbow 1.9 10.0 53 >50  A Elbow    7.8  5.9          Comparison Summary Table   Stim Site NR Peak (ms) Norm Peak (ms) P-T Amp (V) Site1 Site2 Delta-P (ms) Norm Delta (ms)  Left Median/Ulnar Palm Comparison (Wrist - 8cm)  33.3C  Median Palm    2.3 <2.2 25.3 Median Palm Ulnar Palm 0.8   Ulnar Palm    1.5 <2.2 5.4      Right Median/Ulnar Palm Comparison (Wrist -  8cm)  33.3C  Median Palm    1.9 <2.2 43.9 Median Palm Ulnar Palm 0.6   Ulnar Palm    1.3 <2.2 23.6       EMG   Side Muscle Ins Act Fibs Psw Fasc Number Recrt Dur Dur. Amp Amp. Poly Poly. Comment  Left 1stDorInt Nml Nml Nml Nml 1- Rapid Some 1+ Some 1+ Nml Nml N/A  Left Abd Poll Brev Nml Nml Nml Nml Nml Nml Nml Nml Nml Nml Nml Nml N/A  Left FlexPolLong Nml Nml Nml Nml Nml Nml Nml Nml Nml Nml Nml Nml N/A  Left Ext Indicis Nml Nml Nml Nml 1- Rapid Some 1+ Some 1+ Nml Nml N/A  Left PronatorTeres Nml Nml Nml Nml Nml Nml Nml Nml Nml Nml Nml Nml N/A  Left Biceps Nml Nml Nml Nml Nml Nml Nml Nml Nml Nml Nml Nml N/A  Left Triceps Nml Nml Nml Nml Nml Nml Nml Nml Nml Nml Nml Nml N/A  Left Deltoid Nml Nml Nml Nml Nml Nml Nml Nml Nml Nml Nml Nml N/A  Right 1stDorInt Nml Nml Nml Nml 1- Rapid Some 1+ Some 1+ Nml Nml N/A  Right Abd Poll Brev Nml Nml Nml Nml Nml Nml Nml Nml Nml Nml Nml Nml N/A  Right FlexPolLong Nml Nml Nml Nml Nml Nml Nml Nml Nml Nml Nml Nml N/A  Right  Ext Indicis Nml Nml Nml Nml 1- Rapid Some 1+ Some 1+ Nml Nml N/A  Right PronatorTeres Nml Nml Nml Nml Nml Nml Nml Nml Nml Nml Nml Nml N/A  Right Biceps Nml Nml Nml Nml Nml Nml Nml Nml Nml Nml Nml Nml N/A  Right Triceps Nml Nml Nml Nml 1- Rapid Some 1+ Some 1+ Nml Nml N/A  Right Deltoid Nml Nml Nml Nml Nml Nml Nml Nml Nml Nml Nml Nml N/A      Waveforms:

## 2016-12-30 ENCOUNTER — Telehealth: Payer: Self-pay | Admitting: *Deleted

## 2016-12-30 ENCOUNTER — Telehealth: Payer: Self-pay | Admitting: Oncology

## 2016-12-30 ENCOUNTER — Telehealth: Payer: Self-pay | Admitting: Neurology

## 2016-12-30 NOTE — Telephone Encounter (Signed)
Patient called and wanted an appointment with dr [name] said she had been to other doctors and they get figure wout what is going on patient has an appointment on 6-26 at 3:30

## 2016-12-30 NOTE — Telephone Encounter (Signed)
-----   Message from Alda Berthold, DO sent at 12/30/2016  1:36 PM EDT ----- Please send referral for neck PT. I have already called pt. Thanks.

## 2016-12-30 NOTE — Telephone Encounter (Signed)
Called patient and discussed results of her EMG and MRI cervical spine.  EMG showed very mld bilateral CTS as well as C8 radiculopathy; MRI of the cervical spine shows "severe left neural foraminal stenosis and moderate spinal canal stenosis at C4-C5 secondary to disc osteophyte complex; severe right C3-4 facet hypertrophy with only mild right foraminal narrowing".  She currently denies any radicular pain, but complains more of achy discomfort of the neck and tingling of the arms.  Recommend that she start neck physiotherapy for cervical canal stenosis as this is the primary contributor.  She may also start using wrist splint at bedtime as needed for CTS.  [name] K. Posey Pronto, DO

## 2016-12-30 NOTE — Telephone Encounter (Signed)
Referral sent 

## 2016-12-31 DIAGNOSIS — G629 Polyneuropathy, unspecified: Secondary | ICD-10-CM | POA: Diagnosis not present

## 2016-12-31 DIAGNOSIS — Z853 Personal history of malignant neoplasm of breast: Secondary | ICD-10-CM | POA: Diagnosis not present

## 2016-12-31 DIAGNOSIS — R600 Localized edema: Secondary | ICD-10-CM | POA: Diagnosis not present

## 2016-12-31 DIAGNOSIS — R208 Other disturbances of skin sensation: Secondary | ICD-10-CM | POA: Diagnosis not present

## 2016-12-31 DIAGNOSIS — R21 Rash and other nonspecific skin eruption: Secondary | ICD-10-CM | POA: Diagnosis not present

## 2017-01-06 ENCOUNTER — Telehealth: Payer: Self-pay | Admitting: Neurology

## 2017-01-06 DIAGNOSIS — M25552 Pain in left hip: Secondary | ICD-10-CM | POA: Diagnosis not present

## 2017-01-06 DIAGNOSIS — M545 Low back pain: Secondary | ICD-10-CM | POA: Diagnosis not present

## 2017-01-06 DIAGNOSIS — M2550 Pain in unspecified joint: Secondary | ICD-10-CM | POA: Diagnosis not present

## 2017-01-06 DIAGNOSIS — M79641 Pain in right hand: Secondary | ICD-10-CM | POA: Diagnosis not present

## 2017-01-06 DIAGNOSIS — M79642 Pain in left hand: Secondary | ICD-10-CM | POA: Diagnosis not present

## 2017-01-06 DIAGNOSIS — Z136 Encounter for screening for cardiovascular disorders: Secondary | ICD-10-CM | POA: Diagnosis not present

## 2017-01-06 NOTE — Telephone Encounter (Signed)
Rhonda calling from [facility] called and wanted a copy of PT's EMG for her appointment today [phone] CB# [phone]

## 2017-01-06 NOTE — Telephone Encounter (Signed)
EMG report faxed.  

## 2017-01-07 ENCOUNTER — Ambulatory Visit (HOSPITAL_BASED_OUTPATIENT_CLINIC_OR_DEPARTMENT_OTHER): Payer: Medicare Other | Admitting: Oncology

## 2017-01-07 VITALS — BP 116/54 | HR 77 | Temp 98.4°F | Resp 18 | Ht 65.0 in | Wt 156.0 lb

## 2017-01-07 DIAGNOSIS — R609 Edema, unspecified: Secondary | ICD-10-CM

## 2017-01-07 DIAGNOSIS — M79642 Pain in left hand: Secondary | ICD-10-CM | POA: Diagnosis not present

## 2017-01-07 DIAGNOSIS — M8580 Other specified disorders of bone density and structure, unspecified site: Secondary | ICD-10-CM | POA: Diagnosis not present

## 2017-01-07 DIAGNOSIS — M79641 Pain in right hand: Secondary | ICD-10-CM

## 2017-01-07 DIAGNOSIS — C50412 Malignant neoplasm of upper-outer quadrant of left female breast: Secondary | ICD-10-CM | POA: Diagnosis not present

## 2017-01-07 DIAGNOSIS — Z170 Estrogen receptor positive status [ER+]: Secondary | ICD-10-CM

## 2017-01-07 NOTE — Progress Notes (Signed)
JenniferGUSTAV C    01/07/2017  ID: [name]   DOB: 08-24-1950  MR#: [id number]  CSN#:[id number]   PCP: Burman Freestone, MD Patient Care Team: Burman Freestone, MD as PCP - General (Internal Medicine) Luellen Pucker, MD (Gynecology) [name], Virgie Dad, MD (Hematology and Oncology) Jennifer Oka, RN as Registered Nurse Jennifer Porta, MD (Dermatology) Hennie Duos, MD as Consulting Physician (Rheumatology) Alda Berthold, DO as Consulting Physician (Neurology) Rod Holler, MD Jennifer Antigua, MD as Referring Physician (Rheumatology)  CHIEF COMPLAINT: Estrogen receptor positive breast cancer  CURRENT TREATMENT: Tamoxifen  INTERVAL HISTORY: [name] returns today in the setting of her estrogen receptor positive breast cancer, to discuss the swelling and pain problems she has been having for the last 10 months.  As far as her breast cancer is concerned, she continues on tamoxifen. She tolerates this well. Hot flashes and vaginal wetness are not major issues. She obtains a drug at a good price.  According to [name] the current problem is a rash that she developed in both hands in October 2017. She brought it to dermatology's attention and [name] provided her with some creams and treatment with an antihistamine. This did not resolve the problem and she was referred to rheumatology patient saw [name] and according to the patient multiple labs were obtained as well as ultrasound of the hands and other studies none of which established a rheumatologic diagnosis. The patient then was evaluated by [name] who obtained cardiac studies including an echocardiogram which did not show any obvious explanation of the patient's lower extremity swelling. The patient also had EMGs which showed according to the patient mild bilateral carpal tunnel. She had an MRI of the cervical spine 12/25/2016 which shows severe left neural foraminal stenosis and moderate spinal canal  stenosis at C4-C5 as well as severe right C3-4 facet hypertrophy but with only mild right foraminal narrowing. With all this information she was then evaluated by orthopedics and [name] in [place] has also obtained studies for Borrelia and Lyme disease. He has also arrange for further rheumatologic evaluation at [facility] and this is scheduled for early July with [name].   REVIEW OF SYSTEMS:  [name] admits to some stresses at home but these sound fairly average and normal from her account. She is concerned about travel exposure since she's been to [place] a couple of times. She does not think carpal tunnel explains her hand symptoms and in any case it would not explain her lower extremity edema. She denies any unusual headaches, visual changes, nausea, vomiting, cough, phlegm production, or pleurisy. She is not aware of any intercurrent infection, fever, bleeding, adenopathy, or unexplained weight loss or unexplained fatigue. A detailed review of systems today was otherwise stable   BREAST CANCER HISTORY: From the original intake note:  The patient had routine screening mammography at [facility] 08/27/2011, suggesting a potential abnormality in the left breast. Additional views 08/29/2011 confirmed an irregular mass with architectural or distortion measuring up to 4 cm in the left breast, without associated microcalcifications. By ultrasound this was irregular, and showed posterior shadowing. It measured 3.6 cm sonographically. There were no suspicious lymph nodes noted in the left axilla.  The same day the patient underwent ultrasound-guided biopsy of the left breast mass, showing ([id number]) and invasive ductal carcinoma, grade 1, which was 100% estrogen receptor and 97% progesterone receptor positive. The proliferation marker was 20%. There was no HER-2 amplification with a ratio by CISH  of 1.09.  The patient had bilateral breast MRI 09/03/2011 showing a solitary 3.8 cm left breast lesion, and on  11/04/2011 underwent left lumpectomy and sentinel lymph node sampling with results and subsequent history as detailed below.   PAST MEDICAL HISTORY: Past Medical History:  Diagnosis Date  . Breastr cancer, IDC, Left UOQ, clinical stage II Receptor +, Her 2 - 08/29/2011  . Hyperlipidemia   . Hypothyroidism   . Osteoarthritis     PAST SURGICAL HISTORY: Past Surgical History:  Procedure Laterality Date  . BREAST LUMPECTOMY  11/04/11  . BUNIONECTOMY  2000   Left  . ECTOPIC PREGNANCY SURGERY  1986-88   s/p unilateral salpingectomy  . WISDOM TOOTH EXTRACTION      FAMILY HISTORY Family History  Problem Relation Age of Onset  . Heart disease Mother   . Heart failure Mother   . Heart disease Father   . Cancer Maternal Aunt        ovarian  . Heart disease Paternal Uncle   . Cancer Sister   . Heart disease Brother    the patient's father died at the age of 52 from myocardial infarction. The patient's mother died from congestive heart failure at the age of 20. The patient has one brother age 26 with prostate cancer diagnosed at age 58. The patient's sister is 51 as of 2013. There is no breast or ovarian cancer in the family  GYNECOLOGIC HISTORY:  (Reviewed 12/27/2013) She had menarche at around age 23. She is GX P1, first pregnancy to term age 30. She stopped having periods about 2003. 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 Her husband [name]  has had prostate cancer, status post radiation treatments in [place] about 2 years ago. Her daughter [name] lives in [place]. She has 2 children. 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 The patient attends the [employer].   ADVANCED DIRECTIVES: in place  HEALTH MAINTENANCE: (Updated 12/27/2013) Social History  Substance  Use Topics  . Smoking status: Never Smoker  . Smokeless tobacco: Never Used  . Alcohol use Yes     Comment: Drinks one mixed drink nightly     Colonoscopy: repeat due 2015  PAP: UTD ([facility])  Bone density: June 2013, osteopenia with T score -1.4  Lipid panel: June 2014  Allergies  Allergen Reactions  . Epinephrine Other (See Comments)    Sever headaches  . Oxycodone Anxiety  . Iodine Rash  . Nickel Rash    Rash and blisters  . Other Rash    Hospital gown  . Penicillins Rash    Rash and blisters  . Sulfa Drugs Cross Reactors Rash    Current Outpatient Prescriptions  Medication Sig Dispense Refill  . B Complex-C (SUPER B COMPLEX PO) Take by mouth.    . furosemide (LASIX) 20 MG tablet Take 20 mg by mouth daily.     [name] levothyroxine (SYNTHROID, LEVOTHROID) 88 MCG tablet Take 88 mcg by mouth daily before breakfast.     . Magnesium 400 MG CAPS Take by mouth daily.    . Multiple Vitamin (MULTIVITAMIN) capsule Take 1 capsule by mouth daily.    [name] NALTREXONE HCL PO Take 2 mg by mouth at bedtime.    . tamoxifen (NOLVADEX) 20 MG tablet TAKE 1  TABLET BY MOUTH DAILY 90 tablet 3  . Turmeric Curcumin 500 MG CAPS Take by mouth.     No current facility-administered medications for this visit.     OBJECTIVE: Middle-aged white woman Who appears stated age  66:   01/07/17 1541  BP: (!) 116/54  Pulse: 77  Resp: 18  Temp: 98.4 F (36.9 C)     Body mass index is 25.96 kg/m.    ECOG FS: 0 Filed Weights   01/07/17 1541  Weight: 156 lb (70.8 kg)   Sclerae unicteric, EOMs intact Oropharynx clear and moist; mild rosacea No cervical or supraclavicular adenopathy Lungs no rales or rhonchi Heart regular rate and rhythm Abd soft, nontender, positive bowel sounds MSK no focal spinal tenderness, grade 1 bilateral lower extremity lymphedema; the hands show more joint stiffness and swelling and redness, with decreased grip and decreased range of motion of the fingers Neuro:  nonfocal, well oriented, appropriate affect Breasts: The right breast is unremarkable. The left breast is status post lumpectomy and radiation. There is no evidence of local recurrence. Both axillae are benign.     LAB RESULTS: Lab Results  Component Value Date   WBC 6.3 11/29/2014   NEUTROABS 4.7 11/29/2014   HGB 13.4 11/29/2014   HCT 40.6 11/29/2014   MCV 92.9 11/29/2014   PLT 177 11/29/2014      Chemistry      Component Value Date/Time   NA 142 11/29/2014 1013   K 4.1 11/29/2014 1013   CL 109 (H) 12/14/2012 1253   CO2 26 11/29/2014 1013   BUN 16.7 11/29/2014 1013   CREATININE 1.0 11/29/2014 1013      Component Value Date/Time   CALCIUM 9.1 11/29/2014 1013   ALKPHOS 59 11/29/2014 1013   AST 27 11/29/2014 1013   ALT 25 11/29/2014 1013   BILITOT 0.40 11/29/2014 1013       STUDIES: Mr Cervical Spine W Wo Contrast  Result Date: 12/27/2016 CLINICAL DATA:  Bilateral neck and shoulder pain. History of breast cancer status post surgery and radiation. Creatinine was obtained on site at [facility] at [address]Results: Creatinine 1.1 Mg/dL. EXAM: MRI CERVICAL SPINE WITHOUT AND WITH CONTRAST TECHNIQUE: Multiplanar and multiecho pulse sequences of the cervical spine, to include the craniocervical junction and cervicothoracic junction, were obtained without and with intravenous contrast. CONTRAST:  69m MULTIHANCE GADOBENATE DIMEGLUMINE 529 MG/ML IV SOLN COMPARISON:  Cervical spine radiograph 04/22/2007 FINDINGS: Alignment: Normal Vertebrae: Type 2 discogenic signal changes at the L4-L5 level. No other focal marrow signal abnormality. No compression fracture. Cord: Normal caliber and signal. Posterior Fossa, vertebral arteries, paraspinal tissues: Visualized posterior fossa is normal. Vertebral artery flow voids are preserved. Normal visualized paraspinal soft tissues. Disc levels: C1-C2: Normal. C2-C3: Normal disc space and facets. No spinal canal or neuroforaminal  stenosis. C3-C4: Right uncovertebral hypertrophy and severe facet hypertrophy with only mild neural foraminal narrowing. No spinal canal or left neural foraminal stenosis. C4-C5: Large central disc osteophyte complex with extension into the left neural foramen. The ventral thecal sac is effaced with mild indentation of the anterior surface the spinal cord. The spinal canal stenosis is moderate. There is severe narrowing of the left neural foramen. C5-C6: Small disc bulge and mild bilateral uncovertebral hypertrophy. No spinal canal stenosis. No neural foraminal stenosis. C6-C7: Normal disc space and facets. No spinal canal or neuroforaminal stenosis. C7-T1: Normal disc space and facets. No spinal canal or neuroforaminal stenosis. T1-T2:  Normal disc space and facets.  No  stenosis. T2-3: Imaged on sagittal sequences only.  Normal disc.  No stenosis. IMPRESSION: 1. Severe left neural foraminal stenosis and moderate spinal canal stenosis at C4-C5 secondary to disc osteophyte complex. 2. Severe right C3-4 facet hypertrophy with only mild right foraminal narrowing. Electronically Signed   By: Ulyses Jarred M.D.   On: 12/27/2016 21:59     ASSESSMENT: 66 y.o.  [name] woman   (1)  status post left lumpectomy 11/04/2011 for a pT2 pN0, stage IIA invasive ductal carcinoma, grade 2, strongly estrogen and progesterone receptor positive, HER-2 negative, with an MIB-1 of 20%.   (2)  completed radiation treatments June 2013    (3)  started tamoxifen mid-July 2013, to be continued until 2023  (4) breast density category C  (a) status post breast MRI in April 2015 showing 2 abnormalities in the right breast; biopsy of both spots 11/19/2013 showed only fibrocystic PASH   (5) osteopenia with a T score of -1.5 at the left femoral neck 04/18/2014  (a) on vitamin D supplementation, weightbearing exercise program, and tamoxifen  PLAN:  I spent approximately 45 minutes with Ily today going over her recent history and  current situation.  Everlynn is  just over 5 years out from definitive surgery for her breast cancer with no evidence of disease recurrence. This is very favorable.  She continues to tolerate tamoxifen well and the plan is to continue this for total of 10 years.  In the meantime she has these persistent hand symptoms as well as some peripheral edema and other concerns that have been thoroughly worked up by neurology rheumatology and orthopedics without a definitive diagnosis being established.  The possibility of a paraneoplastic syndrome has been suggested. It will be useful in my opinion to clear the air in that regard and I'm going to obtain some tumor markers as well as of course baseline labs and a CT scan of the chest abdomen and pelvis. Given the clinical picture she is presenting I anticipate these will not demonstrate cancer. I'm hopeful we will not have to deal with false positives but if we do we will resolve those.  She is bpinning her hopes on the upcoming visit at [facility] and I am hopeful she will be able to put a name on her condition even if no obvious treatment is found. If that is the case I would encourage her to undergo bilateral carpal tunnel release, that being one diagnosis that has been established, even if it seems "mild", in hopes that might take care of the problem.  Tentatively I have made her a return appointment here in September but of course I will call her with the results of her scans and she knows I will be glad to see her before then as needed.   Chauncey Cruel, MD  01/07/2017

## 2017-01-08 ENCOUNTER — Other Ambulatory Visit (HOSPITAL_BASED_OUTPATIENT_CLINIC_OR_DEPARTMENT_OTHER): Payer: Medicare Other

## 2017-01-08 ENCOUNTER — Telehealth: Payer: Self-pay | Admitting: Neurology

## 2017-01-08 DIAGNOSIS — C50412 Malignant neoplasm of upper-outer quadrant of left female breast: Secondary | ICD-10-CM | POA: Diagnosis present

## 2017-01-08 DIAGNOSIS — Z170 Estrogen receptor positive status [ER+]: Principal | ICD-10-CM

## 2017-01-08 LAB — CBC WITH DIFFERENTIAL/PLATELET
BASO%: 0.3 % (ref 0.0–2.0)
Basophils Absolute: 0 10*3/uL (ref 0.0–0.1)
EOS%: 1.3 % (ref 0.0–7.0)
Eosinophils Absolute: 0.1 10*3/uL (ref 0.0–0.5)
HCT: 38 % (ref 34.8–46.6)
HGB: 12.4 g/dL (ref 11.6–15.9)
LYMPH%: 11.6 % — AB (ref 14.0–49.7)
MCH: 30.4 pg (ref 25.1–34.0)
MCHC: 32.6 g/dL (ref 31.5–36.0)
MCV: 93.1 fL (ref 79.5–101.0)
MONO#: 0.8 10*3/uL (ref 0.1–0.9)
MONO%: 10.9 % (ref 0.0–14.0)
NEUT#: 5.2 10*3/uL (ref 1.5–6.5)
NEUT%: 75.9 % (ref 38.4–76.8)
PLATELETS: 196 10*3/uL (ref 145–400)
RBC: 4.08 10*6/uL (ref 3.70–5.45)
RDW: 13.7 % (ref 11.2–14.5)
WBC: 6.9 10*3/uL (ref 3.9–10.3)
lymph#: 0.8 10*3/uL — ABNORMAL LOW (ref 0.9–3.3)

## 2017-01-08 LAB — COMPREHENSIVE METABOLIC PANEL
ALT: 21 U/L (ref 0–55)
ANION GAP: 6 meq/L (ref 3–11)
AST: 32 U/L (ref 5–34)
Albumin: 3.4 g/dL — ABNORMAL LOW (ref 3.5–5.0)
Alkaline Phosphatase: 48 U/L (ref 40–150)
BUN: 14.8 mg/dL (ref 7.0–26.0)
CHLORIDE: 108 meq/L (ref 98–109)
CO2: 26 meq/L (ref 22–29)
CREATININE: 1.1 mg/dL (ref 0.6–1.1)
Calcium: 9.3 mg/dL (ref 8.4–10.4)
EGFR: 53 mL/min/{1.73_m2} — AB (ref >90)
Glucose: 86 mg/dl (ref 70–140)
Potassium: 4.6 mEq/L (ref 3.5–5.1)
SODIUM: 141 meq/L (ref 136–145)
Total Bilirubin: 0.47 mg/dL (ref 0.20–1.20)
Total Protein: 6.1 g/dL — ABNORMAL LOW (ref 6.4–8.3)

## 2017-01-08 NOTE — Telephone Encounter (Signed)
Copy up front. Patient notified.

## 2017-01-08 NOTE — Telephone Encounter (Signed)
PT left a VM message asking if she can pick up a copy of her MRI report

## 2017-01-10 ENCOUNTER — Other Ambulatory Visit: Payer: Self-pay | Admitting: *Deleted

## 2017-01-10 MED ORDER — PREDNISONE 50 MG PO TABS: ORAL_TABLET | ORAL | 3 refills | Status: DC

## 2017-01-10 NOTE — Telephone Encounter (Signed)
This RN called in steroid for prep prior to obtaining CT's - pt contacted and instructions discussed.

## 2017-01-20 ENCOUNTER — Telehealth: Payer: Self-pay | Admitting: *Deleted

## 2017-01-20 NOTE — Telephone Encounter (Signed)
This RN reviewed chart per pt's call regarding labs drawn at recent visit - with contact to our lab - verifying labs ordered on 01/07/2017 were not drawn.  Above is a phlebotomist over site - Management consultant will be made aware.  This RN informed pt of above situation and offered to have pt come back in for lab draw.  [name] states " Ugh - I have been stuck for labs like over 40 times in the last several months "  Pt asked above the order labs and what they would show - this RN provided appropriate answers.  Presently [name] is scheduled for follow up at [facility] with a CT this week - per discussion [name] would like to see what occurs with current work up with [facility] and then if negative she will call to schedule missed labs per this office.  This note will be forwarded to MD for communication.

## 2017-01-21 ENCOUNTER — Ambulatory Visit (HOSPITAL_COMMUNITY)
Admission: RE | Admit: 2017-01-21 | Discharge: 2017-01-21 | Disposition: A | Discharge status: Home / Self Care | Payer: Medicare Other | Source: Ambulatory Visit | Attending: Oncology | Admitting: Oncology

## 2017-01-21 ENCOUNTER — Encounter (HOSPITAL_COMMUNITY): Payer: Self-pay | Admitting: Radiology

## 2017-01-21 DIAGNOSIS — C50412 Malignant neoplasm of upper-outer quadrant of left female breast: Secondary | ICD-10-CM | POA: Diagnosis not present

## 2017-01-21 DIAGNOSIS — M47816 Spondylosis without myelopathy or radiculopathy, lumbar region: Secondary | ICD-10-CM | POA: Insufficient documentation

## 2017-01-21 DIAGNOSIS — R938 Abnormal findings on diagnostic imaging of other specified body structures: Secondary | ICD-10-CM | POA: Diagnosis not present

## 2017-01-21 DIAGNOSIS — Z170 Estrogen receptor positive status [ER+]: Secondary | ICD-10-CM | POA: Diagnosis not present

## 2017-01-21 DIAGNOSIS — R0789 Other chest pain: Secondary | ICD-10-CM | POA: Diagnosis not present

## 2017-01-21 MED ORDER — IOPAMIDOL (ISOVUE-300) INJECTION 61%
INTRAVENOUS | Status: AC
  Filled 2017-01-21: qty 100

## 2017-01-21 MED ORDER — IOPAMIDOL (ISOVUE-300) INJECTION 61%
100.0000 mL | Freq: Once | INTRAVENOUS | Status: AC | PRN
  Administered 2017-01-21: 100 mL via INTRAVENOUS

## 2017-01-22 ENCOUNTER — Telehealth: Payer: Self-pay | Admitting: *Deleted

## 2017-01-22 NOTE — Telephone Encounter (Signed)
Voicemail from patient "in regards to results of yesterday's PET scan.  I need results.  Going to [facility].  The [facility] fax number is [phone] but they asked for results to be pushed to 'Power [employer]' for them to translate faster to get results to the doctor.  Forwarded to collaborative.  This nurse notified Tedra Coupe with Imaging to send to [facility] via [employer].

## 2017-01-23 DIAGNOSIS — I7300 Raynaud's syndrome without gangrene: Secondary | ICD-10-CM | POA: Diagnosis not present

## 2017-01-23 DIAGNOSIS — M1990 Unspecified osteoarthritis, unspecified site: Secondary | ICD-10-CM | POA: Diagnosis not present

## 2017-01-23 DIAGNOSIS — M2550 Pain in unspecified joint: Secondary | ICD-10-CM | POA: Diagnosis not present

## 2017-01-23 DIAGNOSIS — M129 Arthropathy, unspecified: Secondary | ICD-10-CM | POA: Diagnosis not present

## 2017-01-23 DIAGNOSIS — R234 Changes in skin texture: Secondary | ICD-10-CM | POA: Diagnosis not present

## 2017-01-23 DIAGNOSIS — L944 Gottron's papules: Secondary | ICD-10-CM | POA: Diagnosis not present

## 2017-01-24 DIAGNOSIS — L239 Allergic contact dermatitis, unspecified cause: Secondary | ICD-10-CM | POA: Diagnosis not present

## 2017-01-24 DIAGNOSIS — D485 Neoplasm of uncertain behavior of skin: Secondary | ICD-10-CM | POA: Diagnosis not present

## 2017-01-24 DIAGNOSIS — L821 Other seborrheic keratosis: Secondary | ICD-10-CM | POA: Diagnosis not present

## 2017-01-29 DIAGNOSIS — M542 Cervicalgia: Secondary | ICD-10-CM | POA: Diagnosis not present

## 2017-02-06 DIAGNOSIS — M542 Cervicalgia: Secondary | ICD-10-CM | POA: Diagnosis not present

## 2017-02-13 DIAGNOSIS — M542 Cervicalgia: Secondary | ICD-10-CM | POA: Diagnosis not present

## 2017-02-19 DIAGNOSIS — M542 Cervicalgia: Secondary | ICD-10-CM | POA: Diagnosis not present

## 2017-02-21 DIAGNOSIS — Z79899 Other long term (current) drug therapy: Secondary | ICD-10-CM | POA: Diagnosis not present

## 2017-02-24 ENCOUNTER — Other Ambulatory Visit: Payer: Self-pay | Admitting: Oncology

## 2017-02-26 DIAGNOSIS — M79642 Pain in left hand: Secondary | ICD-10-CM | POA: Diagnosis not present

## 2017-02-26 DIAGNOSIS — M79641 Pain in right hand: Secondary | ICD-10-CM | POA: Diagnosis not present

## 2017-02-26 DIAGNOSIS — M542 Cervicalgia: Secondary | ICD-10-CM | POA: Diagnosis not present

## 2017-02-26 DIAGNOSIS — R234 Changes in skin texture: Secondary | ICD-10-CM | POA: Diagnosis not present

## 2017-03-04 DIAGNOSIS — M542 Cervicalgia: Secondary | ICD-10-CM | POA: Diagnosis not present

## 2017-03-07 ENCOUNTER — Telehealth: Payer: Self-pay | Admitting: *Deleted

## 2017-03-07 DIAGNOSIS — L239 Allergic contact dermatitis, unspecified cause: Secondary | ICD-10-CM | POA: Diagnosis not present

## 2017-03-07 NOTE — Telephone Encounter (Signed)
Message left by pt inquiring if " Dr Flonnie Overman from [facility] has been in contact with Dr [name] regarding obtaining a PET scan previously discussed "  Pt states she is scheduled to see Dr Jannifer Rodney next week and wanted to be able to discuss her concerns.   This RN obtained and copied excerpt from dictation per visit with Dr Flonnie Overman on 02/21/2017 regarding PET:  " Given the relatively fast onset of symptoms and negative antibody studies, my primary concern continues to be that her symptoms may be related to a paraneoplastic syndrome. I described this issue at length with the patient. Although it does seem a low likelihood that she has redeveloped breast cancer there could be other areas where a malignancy may have occurred. The patient's oncologist has brought up the possibility of a PET scan. I think this would be very reasonable to help rule out a malignancy more definitively. There was a small area of abnormality noted in the pelvic ultrasound and I suspect this will require further workup. Other areas for possible screening include an EGD to evaluate the stomach. Malignancy related autoimmune disease is only cured when the malignancy is treated and therefore it is of great importance to rule out a new cancer. "  This RN returned call to pt to inform her - her inquiry was located in [place] and will be given to MD for review.  Lilith states if PET ordered she would like to obtain after September 19 " because I will be going out of town "  This note will be given to MD for review and further recommendation.

## 2017-03-12 ENCOUNTER — Ambulatory Visit (HOSPITAL_BASED_OUTPATIENT_CLINIC_OR_DEPARTMENT_OTHER): Payer: Medicare Other | Admitting: Oncology

## 2017-03-12 DIAGNOSIS — Z170 Estrogen receptor positive status [ER+]: Secondary | ICD-10-CM

## 2017-03-12 DIAGNOSIS — M8580 Other specified disorders of bone density and structure, unspecified site: Secondary | ICD-10-CM | POA: Diagnosis not present

## 2017-03-12 DIAGNOSIS — C50412 Malignant neoplasm of upper-outer quadrant of left female breast: Secondary | ICD-10-CM

## 2017-03-12 DIAGNOSIS — G130 Paraneoplastic neuromyopathy and neuropathy: Principal | ICD-10-CM

## 2017-03-12 DIAGNOSIS — D499 Neoplasm of unspecified behavior of unspecified site: Secondary | ICD-10-CM

## 2017-03-12 DIAGNOSIS — M542 Cervicalgia: Secondary | ICD-10-CM | POA: Diagnosis not present

## 2017-03-12 MED ORDER — POTASSIUM CHLORIDE ER 10 MEQ PO TBCR: 10.0000 meq | EXTENDED_RELEASE_TABLET | Freq: Every day | ORAL | 4 refills | Status: DC

## 2017-03-12 NOTE — Progress Notes (Signed)
Gage Treiber C    03/12/2017  ID: [name]   DOB: Mar 11, 1951  MR#: [id number]  QBH#:[id number]   PCP: Marton Redwood, MD Patient Care Team: Marton Redwood, MD as PCP - General (Internal Medicine) Luellen Pucker, MD (Gynecology) Arlynn Mcdermid, Virgie Dad, MD (Hematology and Oncology) Jesusita Oka, RN as Registered Nurse Arlis Porta, MD (Dermatology) Hennie Duos, MD as Consulting Physician (Rheumatology) Alda Berthold, DO as Consulting Physician (Neurology) Rod Holler, MD Flonnie Overman Leata Mouse, MD as Referring Physician (Rheumatology)  CHIEF COMPLAINT: Estrogen receptor positive breast cancer  CURRENT TREATMENT: Tamoxifen  INTERVAL HISTORY: Ivyana returns today for follow-up and treatment of her estrogen receptor positive breast cancer. As far as that is concerned she continues on tamoxifen, generally with good tolerance. Hot flashes and vaginal wetness are not major issues.  For the past year or so Janaia has been having a variety of nonspecific symptoms including rash and swelling, which had been evaluated by dermatology Baltazar Najjar), rheumatology Amil Amen), neurology Posey Pronto) and orthopedics Shon Baton).. On 02/21/2017 she was evaluated by Dr. Flonnie Overman at [facility]. He expresses a concern regarding early scleroderma and notes a high titer positive ANA. He thinks dermatomyositis is less likely. He expressed concerned that she might have a paraneoplastic syndrome. He suggested a PET scan which the patient is agreeable to but does not want to have performed until she returns from a trip in mid September. He also suggested a possible pelvic ultrasound and EGD. He felt if all this was negative he would consider a trial of steroids.   However given the delay in obtaining some of these tests, he has gone ahead and started Jenasis on methotrexate at 15 mg weekly as well as prednisone, currently at 10 mg daily.  As far as breast cancer is concerned, Asaiah continues on tamoxifen, with good  tolerance. She does have some hot flashes but these are mild. There is no significant vaginal discharge. She obtains a drug at a good price.   REVIEW OF SYSTEMS:  Tashica tells me as soon as she started the methotrexate her hand pain felt much better. On the other hand she hates the steroids. It has caused some labile affect, and she cries easily. She currently has a daughter, son-in-law and their children in their home and this adds to the stress. (They are moving to town and their house is not yet ready). Complains of insomnia, pain, mild fluid retention, and recently, at 15 mg of prednisone daily, developed a sore throat and cough, which improved when she dropped the dose on her own cognizant attends to 10 mg daily (she did let [facility] know). She is currently not exercising. A detailed review of systems was otherwise stable  BREAST CANCER HISTORY: From the original intake note:  The patient had routine screening mammography at [facility] 08/27/2011, suggesting a potential abnormality in the left breast. Additional views 08/29/2011 confirmed an irregular mass with architectural or distortion measuring up to 4 cm in the left breast, without associated microcalcifications. By ultrasound this was irregular, and showed posterior shadowing. It measured 3.6 cm sonographically. There were no suspicious lymph nodes noted in the left axilla.  The same day the patient underwent ultrasound-guided biopsy of the left breast mass, showing ([id number]) and invasive ductal carcinoma, grade 1, which was 100% estrogen receptor and 97% progesterone receptor positive. The proliferation marker was 20%. There was no HER-2 amplification with a ratio by CISH of 1.09.  The patient had bilateral breast MRI 09/03/2011 showing a solitary  3.8 cm left breast lesion, and on 11/04/2011 underwent left lumpectomy and sentinel lymph node sampling with results and subsequent history as detailed below.   PAST MEDICAL HISTORY: Past Medical  History:  Diagnosis Date  . Breastr cancer, IDC, Left UOQ, clinical stage II Receptor +, Her 2 - 08/29/2011  . Hyperlipidemia   . Hypothyroidism   . Osteoarthritis     PAST SURGICAL HISTORY: Past Surgical History:  Procedure Laterality Date  . BREAST LUMPECTOMY  11/04/11  . BUNIONECTOMY  2000   Left  . ECTOPIC PREGNANCY SURGERY  1986-88   s/p unilateral salpingectomy  . WISDOM TOOTH EXTRACTION      FAMILY HISTORY Family History  Problem Relation Age of Onset  . Heart disease Mother   . Heart failure Mother   . Heart disease Father   . Cancer Maternal Aunt        ovarian  . Heart disease Paternal Uncle   . Cancer Sister   . Heart disease Brother    the patient's father died at the age of 79 from myocardial infarction. The patient's mother died from congestive heart failure at the age of 23. The patient has one brother age 69 with prostate cancer diagnosed at age 38. The patient's sister is 73 as of 2013. There is no breast or ovarian cancer in the family  GYNECOLOGIC HISTORY:  (Reviewed 12/27/2013) She had menarche at around age 8. She is GX P1, first pregnancy to term age 73. She stopped having periods about 2003. She did not use hormone replacement  SOCIAL HISTORY:  (Updated August 2018). 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 Her husband Rush Landmark  has had prostate cancer, status post radiation treatments in [place] about 5 years ago. Her daughter Lanelle Bal lives in [place] but is moving to [place] and is currently residing with the patient with her husband and 2 children while the new house is getting ready. 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 The patient attends the [employer].   ADVANCED DIRECTIVES: in place  HEALTH MAINTENANCE: (Updated 12/27/2013) Social History  Substance Use Topics  . Smoking status: Never Smoker   . Smokeless tobacco: Never Used  . Alcohol use Yes     Comment: Drinks one mixed drink nightly     Colonoscopy: repeat due 2015  PAP: UTD (Richard Edwyna Shell)  Bone density: June 2013, osteopenia with T score -1.4  Lipid panel: June 2014  Allergies  Allergen Reactions  . Epinephrine Other (See Comments)    Sever headaches  . Iodine Rash  . Oxycodone Anxiety  . Nickel Rash    Rash and blisters  . Other Rash    Hospital gown  . Penicillins Rash    Rash and blisters  . Sulfa Drugs Cross Reactors Rash    Current Outpatient Prescriptions  Medication Sig Dispense Refill  . folic acid (FOLVITE) 1 MG tablet     . levothyroxine (SYNTHROID, LEVOTHROID) 88 MCG tablet Take 88 mcg by mouth daily before breakfast.     . Magnesium 400 MG CAPS Take by mouth daily.    . methotrexate (RHEUMATREX) 2.5 MG tablet     . predniSONE (DELTASONE) 50 MG tablet Prep for CT with iodine due to allergy to iodine Pt is to take 50 mg at 13, 6 and 1 hour prior to schedule CT Pt is to  take 50 mg benadryl with the dose of steroids 1 hour prior to CT scan (Patient taking differently: 10 mg. Prep for CT with iodine due to allergy to iodine Pt is to take 50 mg at 13, 6 and 1 hour prior to schedule CT Pt is to take 50 mg benadryl with the dose of steroids 1 hour prior to CT scan) 3 tablet 3  . tamoxifen (NOLVADEX) 20 MG tablet TAKE 1 TABLET BY MOUTH DAILY 90 tablet 2  . Turmeric Curcumin 500 MG CAPS Take by mouth.    . potassium chloride (K-DUR) 10 MEQ tablet Take 1 tablet (10 mEq total) by mouth daily. 90 tablet 4   No current facility-administered medications for this visit.     OBJECTIVE: Middle-aged white woman Who was tearful during today's visit  Vitals:   03/12/17 1510  BP: (!) 126/57  Pulse: 97  Resp: 18  Temp: 98.2 F (36.8 C)  SpO2: 98%     Body mass index is 26.33 kg/m.    ECOG FS: 1 Filed Weights   03/12/17 1510  Weight: 158 lb 3.2 oz (71.8 kg)   Sclerae unicteric, pupils round and  equal Oropharynx clear and moist, no thrush or other lesions No cervical or supraclavicular adenopathy Lungs no rales or rhonchi Heart regular rate and rhythm Abd soft, nontender, positive bowel sounds MSK the hands are swollen and the right hand in particular has significantly restricted motion, with poor grip, but no erythema. Neuro: nonfocal, well oriented, labile affect Breasts: The right breast is benign. The left breast is undergone lumpectomy and radiation with no evidence of local recurrence. Both axillae are benign.   LAB RESULTS: Lab Results  Component Value Date   WBC 6.9 01/08/2017   NEUTROABS 5.2 01/08/2017   HGB 12.4 01/08/2017   HCT 38.0 01/08/2017   MCV 93.1 01/08/2017   PLT 196 01/08/2017      Chemistry      Component Value Date/Time   NA 141 01/08/2017 1032   K 4.6 01/08/2017 1032   CL 109 (H) 12/14/2012 1253   CO2 26 01/08/2017 1032   BUN 14.8 01/08/2017 1032   CREATININE 1.1 01/08/2017 1032      Component Value Date/Time   CALCIUM 9.3 01/08/2017 1032   ALKPHOS 48 01/08/2017 1032   AST 32 01/08/2017 1032   ALT 21 01/08/2017 1032   BILITOT 0.47 01/08/2017 1032       STUDIES: ANA titer 1: 2560 at [facility] 01/23/2017; all other labs in the normal range including anti-SM, anti-RNP, anti-are all, anti-LA, anti-DNA double-stranded antibody, anti-SCL 70, anti-SSA and multiple other studies  ASSESSMENT: 66 y.o.  Starling Manns woman   (1)  status post left lumpectomy 11/04/2011 for a pT2 pN0, stage IIA invasive ductal carcinoma, grade 2, strongly estrogen and progesterone receptor positive, HER-2 negative, with an MIB-1 of 20%.   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  PLAN:  Koda is a little over 5 years out from definitive surgery for  her breast cancer with no evidence of disease recurrence. This is very favorable.  She has completed 5 years of tamoxifen. The plan is to continue that for a total of 10 years.  Her biggest problem of course is her poorly defined rheumatologic condition. She is finally under treatment and appears to be benefiting from it. She does have significant problems from prednisone, particularly her affect issues and I think she would benefit from venlafaxine at low-dose (75 mg daily). We discussed that today but she would like to defer that decision until she has her case reviewed by Dr. Manuella Ghazi tomorrow. He is taking over as her primary care physician.  We discussed issues related to long-term methotrexate use including lung changes and particularly important indistinct infections.  We are setting her up for a PET scan late in September at Dr.Doss's request and I am also requesting some tumor markers which could be drawn tomorrow at Dr. Trena Platt office. We discussed an EGD to evaluate her esophagus but she would like to defer that test at this point  Tentatively I have put her down to see me in November, but she is welcome to change that date and from a breast cancer point of view I would only need to see her a year from now  She knows to call for any other issues that may develop before her next visit here.   Chauncey Cruel, MD  03/12/2017

## 2017-03-13 DIAGNOSIS — R938 Abnormal findings on diagnostic imaging of other specified body structures: Secondary | ICD-10-CM | POA: Diagnosis not present

## 2017-03-13 DIAGNOSIS — Z853 Personal history of malignant neoplasm of breast: Secondary | ICD-10-CM | POA: Diagnosis not present

## 2017-03-13 DIAGNOSIS — Z6827 Body mass index (BMI) 27.0-27.9, adult: Secondary | ICD-10-CM | POA: Diagnosis not present

## 2017-03-13 DIAGNOSIS — R1310 Dysphagia, unspecified: Secondary | ICD-10-CM | POA: Diagnosis not present

## 2017-03-13 DIAGNOSIS — R760 Raised antibody titer: Secondary | ICD-10-CM | POA: Diagnosis not present

## 2017-03-13 DIAGNOSIS — R5383 Other fatigue: Secondary | ICD-10-CM | POA: Diagnosis not present

## 2017-03-13 DIAGNOSIS — M349 Systemic sclerosis, unspecified: Secondary | ICD-10-CM | POA: Diagnosis not present

## 2017-03-13 DIAGNOSIS — F329 Major depressive disorder, single episode, unspecified: Secondary | ICD-10-CM | POA: Diagnosis not present

## 2017-03-13 DIAGNOSIS — R05 Cough: Secondary | ICD-10-CM | POA: Diagnosis not present

## 2017-03-13 DIAGNOSIS — Z1389 Encounter for screening for other disorder: Secondary | ICD-10-CM | POA: Diagnosis not present

## 2017-03-13 DIAGNOSIS — M3489 Other systemic sclerosis: Secondary | ICD-10-CM | POA: Diagnosis not present

## 2017-03-13 DIAGNOSIS — D8989 Other specified disorders involving the immune mechanism, not elsewhere classified: Secondary | ICD-10-CM | POA: Diagnosis not present

## 2017-03-13 DIAGNOSIS — J181 Lobar pneumonia, unspecified organism: Secondary | ICD-10-CM | POA: Diagnosis not present

## 2017-03-14 ENCOUNTER — Other Ambulatory Visit: Payer: Self-pay | Admitting: Internal Medicine

## 2017-03-14 ENCOUNTER — Encounter: Payer: Self-pay | Admitting: Internal Medicine

## 2017-03-14 DIAGNOSIS — R9389 Abnormal findings on diagnostic imaging of other specified body structures: Secondary | ICD-10-CM

## 2017-03-20 ENCOUNTER — Other Ambulatory Visit: Payer: Self-pay | Admitting: Oncology

## 2017-03-20 DIAGNOSIS — C50412 Malignant neoplasm of upper-outer quadrant of left female breast: Secondary | ICD-10-CM

## 2017-03-20 DIAGNOSIS — M542 Cervicalgia: Secondary | ICD-10-CM | POA: Diagnosis not present

## 2017-03-20 DIAGNOSIS — Z170 Estrogen receptor positive status [ER+]: Principal | ICD-10-CM

## 2017-03-20 DIAGNOSIS — J181 Lobar pneumonia, unspecified organism: Secondary | ICD-10-CM | POA: Diagnosis not present

## 2017-03-21 ENCOUNTER — Ambulatory Visit
Admission: RE | Admit: 2017-03-21 | Discharge: 2017-03-21 | Disposition: A | Discharge status: Home / Self Care | Payer: Medicare Other | Source: Ambulatory Visit | Attending: Internal Medicine | Admitting: Internal Medicine

## 2017-03-21 DIAGNOSIS — N8500 Endometrial hyperplasia, unspecified: Secondary | ICD-10-CM | POA: Diagnosis not present

## 2017-03-21 DIAGNOSIS — R9389 Abnormal findings on diagnostic imaging of other specified body structures: Secondary | ICD-10-CM

## 2017-03-26 ENCOUNTER — Telehealth: Payer: Self-pay | Admitting: Obstetrics & Gynecology

## 2017-03-26 NOTE — Telephone Encounter (Signed)
Called and left a message for patient to call back to schedule a new patient doctor referral for endometrial thickening.

## 2017-03-27 NOTE — Telephone Encounter (Signed)
Called and left a message for patient to call back to schedule a new patient doctor referral. °

## 2017-04-02 ENCOUNTER — Other Ambulatory Visit: Payer: Self-pay | Admitting: Internal Medicine

## 2017-04-02 DIAGNOSIS — J181 Lobar pneumonia, unspecified organism: Secondary | ICD-10-CM | POA: Diagnosis not present

## 2017-04-02 DIAGNOSIS — R9389 Abnormal findings on diagnostic imaging of other specified body structures: Secondary | ICD-10-CM

## 2017-04-02 DIAGNOSIS — R918 Other nonspecific abnormal finding of lung field: Secondary | ICD-10-CM | POA: Diagnosis not present

## 2017-04-04 ENCOUNTER — Other Ambulatory Visit: Payer: Medicare Other

## 2017-04-04 ENCOUNTER — Encounter: Payer: Self-pay | Admitting: Obstetrics & Gynecology

## 2017-04-04 ENCOUNTER — Other Ambulatory Visit (HOSPITAL_COMMUNITY)
Admission: RE | Admit: 2017-04-04 | Discharge: 2017-04-04 | Disposition: A | Discharge status: Home / Self Care | Payer: Medicare Other | Source: Ambulatory Visit | Attending: Obstetrics & Gynecology | Admitting: Obstetrics & Gynecology

## 2017-04-04 ENCOUNTER — Ambulatory Visit (INDEPENDENT_AMBULATORY_CARE_PROVIDER_SITE_OTHER): Payer: Medicare Other | Admitting: Obstetrics & Gynecology

## 2017-04-04 VITALS — BP 132/68 | HR 72 | Resp 14 | Ht 63.75 in | Wt 163.0 lb

## 2017-04-04 DIAGNOSIS — R9389 Abnormal findings on diagnostic imaging of other specified body structures: Secondary | ICD-10-CM

## 2017-04-04 DIAGNOSIS — Z1151 Encounter for screening for human papillomavirus (HPV): Secondary | ICD-10-CM | POA: Insufficient documentation

## 2017-04-04 DIAGNOSIS — N858 Other specified noninflammatory disorders of uterus: Secondary | ICD-10-CM | POA: Diagnosis not present

## 2017-04-04 DIAGNOSIS — Z124 Encounter for screening for malignant neoplasm of cervix: Secondary | ICD-10-CM

## 2017-04-04 DIAGNOSIS — R935 Abnormal findings on diagnostic imaging of other abdominal regions, including retroperitoneum: Secondary | ICD-10-CM

## 2017-04-04 DIAGNOSIS — Z79899 Other long term (current) drug therapy: Secondary | ICD-10-CM

## 2017-04-04 DIAGNOSIS — R938 Abnormal findings on diagnostic imaging of other specified body structures: Secondary | ICD-10-CM | POA: Diagnosis not present

## 2017-04-04 NOTE — Progress Notes (Signed)
CC:  Recommendations about abnormal endometrium Referring provider:  Dr. Marton Redwood  [id number] Married Caucasian F here as new patient to discuss recent findings on CT scan and ultrasound.  CT was obtained 01/21/17 showing thickened endometrium at 1.1cm as well as small fibroids.  CT also showed "small foci of low-density along the myometrium could be vascular or due to adenomyosis.  Ultrasound was performed 03/21/17 showing uterus measuring 10.9 x 5. X 6.5cm with thickened endometrium measuring 2cm with scatter cystic areas.  Ovaries were normal.  Pt's history if significant for estrogen receptor positive breast cancer diagnosed 08/27/11.  This was 4cm in size.  Was Er/PR positive with neg Her-2 amplification.  She was treated with lumpectomy 11/04/11 and SNB that was negative.  She underwent radiation and then was started on Tamoxifen 7/12 until currently.  Tamoxifen for 10 years has been recommended.    More recently, pt has been experiencing skin rash and swelling and has seen local dermatologist and rheumatologist.  ANA has been positive.  Also has seen rheumatologist at [facility].  Possible scleroderma has been discussed.  She does have a PET scan scheduled for Monday, 04/07/17, to rule-out paraneoplastic syndrome.  No LMP recorded. Patient is postmenopausal.          Sexually active: Yes.    The current method of family planning is post menopausal status.    Exercising: Yes.    jazzercise Smoker:  no  Health Maintenance: Pap:  2016 with Dr. Kris Mouton  History of abnormal Pap:  no MMG: 11/14/16 BIRADS 2 benign  Colonoscopy:  2-3 years ago  BMD:   04/18/14 osteopenia  TDaP:  Unsure  Pneumonia vaccine(s):  never Zostavax:   2013  Hep C testing: done at [facility]: PCP, Hb today: PCP, Urine today: PCP   reports that she has never smoked. She has never used smokeless tobacco. She reports that she drinks alcohol. She reports that she does not use drugs.  Past Medical History:  Diagnosis Date   . Breastr cancer, IDC, Left UOQ, clinical stage II Receptor +, Her 2 - 08/29/2011  . Hyperlipidemia   . Hypothyroidism   . Osteoarthritis     Past Surgical History:  Procedure Laterality Date  . BREAST LUMPECTOMY  11/04/11  . BUNIONECTOMY  2000   Left  . ECTOPIC PREGNANCY SURGERY  1986-88   s/p unilateral salpingectomy  . WISDOM TOOTH EXTRACTION      Current Outpatient Prescriptions  Medication Sig Dispense Refill  . folic acid (FOLVITE) 1 MG tablet     . levothyroxine (SYNTHROID, LEVOTHROID) 88 MCG tablet Take 88 mcg by mouth daily before breakfast.     . Magnesium 400 MG CAPS Take by mouth daily.    . methotrexate (RHEUMATREX) 2.5 MG tablet     . predniSONE (DELTASONE) 50 MG tablet Prep for CT with iodine due to allergy to iodine Pt is to take 50 mg at 13, 6 and 1 hour prior to schedule CT Pt is to take 50 mg benadryl with the dose of steroids 1 hour prior to CT scan (Patient taking differently: 10 mg. Prep for CT with iodine due to allergy to iodine Pt is to take 50 mg at 13, 6 and 1 hour prior to schedule CT Pt is to take 50 mg benadryl with the dose of steroids 1 hour prior to CT scan) 3 tablet 3  . tamoxifen (NOLVADEX) 20 MG tablet TAKE 1 TABLET BY MOUTH DAILY 90 tablet 2  . Turmeric  Curcumin 500 MG CAPS Take by mouth.     No current facility-administered medications for this visit.     Family History  Problem Relation Age of Onset  . Heart disease Mother   . Heart failure Mother   . Heart disease Father   . Cancer Maternal Aunt        ovarian  . Heart disease Paternal Uncle   . Autoimmune disease Sister   . Heart disease Brother     ROS:  Pertinent items are noted in HPI.  Otherwise, a comprehensive ROS was negative.  Exam:   BP 132/68 (BP Location: Right Arm, Patient Position: Sitting, Cuff Size: Normal)   Pulse 72   Resp 14   Ht 5' 3.75" (1.619 m)   Wt 163 lb (73.9 kg)   BMI 28.20 kg/m  Height: 5' 3.75" (161.9 cm)  Ht Readings from Last 3 Encounters:   04/04/17 5' 3.75" (1.619 m)  03/12/17 '5\' 5"'  (1.651 m)  01/07/17 '5\' 5"'  (1.651 m)    General appearance: alert, cooperative and appears stated age Head: Normocephalic, without obvious abnormality, atraumatic Lymph nodes: Cervical, supraclavicular, and axillary nodes normal. No abnormal inguinal nodes palpated Neurologic: Grossly normal   Pelvic: External genitalia:  no lesions              Urethra:  normal appearing urethra with no masses, tenderness or lesions              Bartholins and Skenes: normal                 Vagina: normal appearing vagina with normal color and discharge, no lesions              Cervix: no lesions              Pap taken: Yes.   Bimanual Exam:  Uterus:  Mildly enlarge, mobile, no masses              Adnexa: normal adnexa and no mass, fullness, tenderness               Rectovaginal: Confirms               Anus:  normal sphincter tone, no lesions  Endometrial biopsy recommended.  Discussed with patient.  Verbal and written consent obtained.   Procedure:  Speculum placed.  Cervix visualized and cleansed with betadine prep.  A single toothed tenaculum was applied to the anterior lip of the cervix.  Endometrial pipelle was advanced through the cervix into the endometrial cavity without difficulty.  Pipelle passed to 8.5cm.  Suction applied and pipelle removed with good tissue sample obtained.  Tenculum removed.  No bleeding noted.  Patient tolerated procedure well.  Chaperone was present for exam.  A:  Thickened endometrium H/o breast cancer diagnosed 2013, s/p lumpectomy and SNB with 5 years of tamoxifen (planning for 10 years total) Recent issues with undiagnosed rheumatologic d/o including elevated ANA, rash, skin changes esp to hands/fingers  P:   Pap smear obtained.   Endometrial biopsy obtained as well.  Pathology will be called to pt.  If this is negative, pt and spouse are aware I will recommend hysteroscopy with D&C and resection of any endometrial mass  present.  ~40 minutes spent with patient in total with >50% of time was in face to face discussion of above.

## 2017-04-07 ENCOUNTER — Encounter: Payer: Self-pay | Admitting: Obstetrics & Gynecology

## 2017-04-07 ENCOUNTER — Ambulatory Visit (HOSPITAL_COMMUNITY)
Admission: RE | Admit: 2017-04-07 | Discharge: 2017-04-07 | Disposition: A | Discharge status: Home / Self Care | Payer: Medicare Other | Source: Ambulatory Visit | Attending: Oncology | Admitting: Oncology

## 2017-04-07 ENCOUNTER — Telehealth: Payer: Self-pay

## 2017-04-07 DIAGNOSIS — C50412 Malignant neoplasm of upper-outer quadrant of left female breast: Secondary | ICD-10-CM | POA: Diagnosis not present

## 2017-04-07 DIAGNOSIS — J90 Pleural effusion, not elsewhere classified: Secondary | ICD-10-CM | POA: Diagnosis not present

## 2017-04-07 DIAGNOSIS — Z79899 Other long term (current) drug therapy: Secondary | ICD-10-CM | POA: Insufficient documentation

## 2017-04-07 DIAGNOSIS — R918 Other nonspecific abnormal finding of lung field: Secondary | ICD-10-CM | POA: Insufficient documentation

## 2017-04-07 DIAGNOSIS — Z170 Estrogen receptor positive status [ER+]: Secondary | ICD-10-CM | POA: Diagnosis not present

## 2017-04-07 LAB — GLUCOSE, CAPILLARY: GLUCOSE-CAPILLARY: 95 mg/dL (ref 65–99)

## 2017-04-07 MED ORDER — FLUDEOXYGLUCOSE F - 18 (FDG) INJECTION
8.5000 | Freq: Once | INTRAVENOUS | Status: AC | PRN
  Administered 2017-04-07: 8.5 via INTRAVENOUS

## 2017-04-07 MED ORDER — FLUDEOXYGLUCOSE F - 18 (FDG) INJECTION: 8.5000 | Freq: Once | INTRAVENOUS | Status: DC | PRN

## 2017-04-07 NOTE — Telephone Encounter (Signed)
Spoke with patient about PET scan results.  No cancer was found at this time but there is possibly something else.  LPN asked her about having a chest xray and she stated that she has already had 3 that were inconclusive.  Her Primary care Dr. Brigitte Pulse is working her as well to see what is going on.  She says he thought it may be pneumonia or possible inflammation.  She is currently waiting to hear back from him as he may send her for a CT scan.  Patient states she thinks it could be coming from the Prednisone she is taking as well.

## 2017-04-08 LAB — CYTOLOGY - PAP
DIAGNOSIS: NEGATIVE
HPV (WINDOPATH): NOT DETECTED

## 2017-04-10 ENCOUNTER — Telehealth: Payer: Self-pay | Admitting: *Deleted

## 2017-04-10 DIAGNOSIS — D8989 Other specified disorders involving the immune mechanism, not elsewhere classified: Secondary | ICD-10-CM | POA: Diagnosis not present

## 2017-04-10 DIAGNOSIS — I10 Essential (primary) hypertension: Secondary | ICD-10-CM | POA: Diagnosis not present

## 2017-04-10 DIAGNOSIS — R05 Cough: Secondary | ICD-10-CM | POA: Diagnosis not present

## 2017-04-10 DIAGNOSIS — R1310 Dysphagia, unspecified: Secondary | ICD-10-CM | POA: Diagnosis not present

## 2017-04-10 DIAGNOSIS — M3489 Other systemic sclerosis: Secondary | ICD-10-CM | POA: Diagnosis not present

## 2017-04-10 DIAGNOSIS — R938 Abnormal findings on diagnostic imaging of other specified body structures: Secondary | ICD-10-CM | POA: Diagnosis not present

## 2017-04-10 DIAGNOSIS — R918 Other nonspecific abnormal finding of lung field: Secondary | ICD-10-CM | POA: Diagnosis not present

## 2017-04-10 NOTE — Telephone Encounter (Signed)
Call to patient. Left message to call back.  

## 2017-04-10 NOTE — Telephone Encounter (Signed)
-----   Message from Megan Salon, MD sent at 04/10/2017  7:36 AM EDT ----- Please let pt know her pap smear was normal and HR HPV was negative.  Her endometrial biopsy was also negative for abnormal cells.  Her endometrium is almost 2cm.  I think there is a polyp.  She needs hysteroscopy with possible polyp resection, D&C scheduled.  She is aware this is my recommendation if the biopsy was negative.  Thanks.

## 2017-04-14 NOTE — Telephone Encounter (Signed)
Patient returning your call.

## 2017-04-14 NOTE — Telephone Encounter (Signed)
Spoke with patient. Advised of results as seen below from [place]. Patient verbalizes understanding. Patient states that she is being followed at [facility] for a "lung infection." Has a follow up appointment on 04/18/2017 with [facility] MD. Would like to post pone surgery with Dr.Miller until after she is clear from "lung infection." Patient would like to talk with Gay Filler about scheduling. Aware Gay Filler is out of the office today and will return call once she returns. Patient is agreeable.

## 2017-04-15 NOTE — Telephone Encounter (Signed)
Patient returning your call.

## 2017-04-15 NOTE — Telephone Encounter (Signed)
Call to patient regarding surgery date options. Left message to call back. Left message we can certainly wait till after appointment on 04-18-17 ass we would want her to be cleared for surgery. Call back at her convenience.

## 2017-04-18 DIAGNOSIS — M542 Cervicalgia: Secondary | ICD-10-CM | POA: Diagnosis not present

## 2017-04-18 DIAGNOSIS — Z79899 Other long term (current) drug therapy: Secondary | ICD-10-CM | POA: Diagnosis not present

## 2017-04-18 DIAGNOSIS — R600 Localized edema: Secondary | ICD-10-CM | POA: Diagnosis not present

## 2017-04-18 DIAGNOSIS — M349 Systemic sclerosis, unspecified: Secondary | ICD-10-CM | POA: Diagnosis not present

## 2017-04-22 NOTE — Telephone Encounter (Signed)
Patient returned call. Patient advises she had an appointment at [facility] on Friday 04/18/17 for an "autoimmune issue".  Patient also states she has a "lung infection" and she is scheduled to see a pulmonologist on 05/20/17 and her doctor at [facility] suggested she postpone surgery until after the appointment with the pulmonologist.     routing to Lamont Snowball, RN

## 2017-04-22 NOTE — Telephone Encounter (Signed)
Call placed to patient to review benefits for a recommended surgical procedure. Left voicemail message requesting a return call.   cc: Lamont Snowball, RN

## 2017-04-23 DIAGNOSIS — M542 Cervicalgia: Secondary | ICD-10-CM | POA: Diagnosis not present

## 2017-04-23 DIAGNOSIS — M545 Low back pain: Secondary | ICD-10-CM | POA: Diagnosis not present

## 2017-04-30 NOTE — Telephone Encounter (Signed)
Return call from patient. States she is improving and is off antibiotics. Weaning off steroids.  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  She states that she she is not cleared for surgery until she sees pulmonary specialist on 05-20-17 and she will call back after that appointment.  Advised will update Dr Sabra Heck and call back if any additional recommendations.

## 2017-04-30 NOTE — Telephone Encounter (Signed)
Call to patient for update. Per ROI can leave message on voice mail and message confirms first and last name.  Left message calling for update (was scheduled for appt 04-18-17). Left message to call back. Can speak to triage nurse if I am not available.

## 2017-05-01 NOTE — Telephone Encounter (Signed)
She needs to be cleared for surgery before proceeding.  Ok to wait until she can proceed.  Ok to close encounter.

## 2017-05-02 DIAGNOSIS — M542 Cervicalgia: Secondary | ICD-10-CM | POA: Diagnosis not present

## 2017-05-08 DIAGNOSIS — M349 Systemic sclerosis, unspecified: Secondary | ICD-10-CM | POA: Diagnosis not present

## 2017-05-08 DIAGNOSIS — Z79899 Other long term (current) drug therapy: Secondary | ICD-10-CM | POA: Diagnosis not present

## 2017-05-08 DIAGNOSIS — I7300 Raynaud's syndrome without gangrene: Secondary | ICD-10-CM | POA: Diagnosis not present

## 2017-05-09 DIAGNOSIS — M542 Cervicalgia: Secondary | ICD-10-CM | POA: Diagnosis not present

## 2017-05-13 ENCOUNTER — Encounter: Payer: Self-pay | Admitting: *Deleted

## 2017-05-14 ENCOUNTER — Ambulatory Visit: Payer: Medicare Other | Admitting: Internal Medicine

## 2017-05-19 DIAGNOSIS — R0602 Shortness of breath: Secondary | ICD-10-CM | POA: Diagnosis not present

## 2017-05-20 DIAGNOSIS — J479 Bronchiectasis, uncomplicated: Secondary | ICD-10-CM | POA: Diagnosis not present

## 2017-05-20 DIAGNOSIS — R05 Cough: Secondary | ICD-10-CM | POA: Diagnosis not present

## 2017-05-20 DIAGNOSIS — J849 Interstitial pulmonary disease, unspecified: Secondary | ICD-10-CM | POA: Diagnosis not present

## 2017-05-20 DIAGNOSIS — I313 Pericardial effusion (noninflammatory): Secondary | ICD-10-CM | POA: Diagnosis not present

## 2017-05-20 DIAGNOSIS — J90 Pleural effusion, not elsewhere classified: Secondary | ICD-10-CM | POA: Diagnosis not present

## 2017-05-20 DIAGNOSIS — R0602 Shortness of breath: Secondary | ICD-10-CM | POA: Diagnosis not present

## 2017-05-20 DIAGNOSIS — R918 Other nonspecific abnormal finding of lung field: Secondary | ICD-10-CM | POA: Diagnosis not present

## 2017-05-20 DIAGNOSIS — M3481 Systemic sclerosis with lung involvement: Secondary | ICD-10-CM | POA: Diagnosis not present

## 2017-05-20 DIAGNOSIS — M349 Systemic sclerosis, unspecified: Secondary | ICD-10-CM | POA: Diagnosis not present

## 2017-05-26 DIAGNOSIS — M542 Cervicalgia: Secondary | ICD-10-CM | POA: Diagnosis not present

## 2017-06-02 ENCOUNTER — Telehealth: Payer: Self-pay | Admitting: Obstetrics & Gynecology

## 2017-06-02 NOTE — Telephone Encounter (Signed)
Patient called requesting to speak with Gay Filler to discuss her visit with the pulmonologist at [facility] and whether she is ready for her GYN surgery.

## 2017-06-02 NOTE — Progress Notes (Signed)
ID: [name]   DOB: 04-30-1951  MR#: [id number]  FTD#:[id number]   PCP: Jennifer Redwood, MD Patient Care Team: Jennifer Redwood, MD as PCP - General (Internal Medicine) Jennifer Pucker, MD (Gynecology) [name], Jennifer Dad, MD (Hematology and Oncology) Jennifer Oka, RN as Registered Nurse Jennifer Porta, MD (Dermatology) Jennifer Duos, MD as Consulting Physician (Rheumatology) Jennifer Berthold, DO as Consulting Physician (Neurology) Jennifer Holler, MD Jennifer Antigua, MD as Referring Physician (Rheumatology) Jennifer Pitter, MD as Referring Physician (Pulmonary Disease)  CHIEF COMPLAINT: Estrogen receptor positive breast [name]  CURRENT TREATMENT: Tamoxifen  INTERVAL HISTORY: [name] returns today for follow-up and treatment of her estrogen receptor positive breast [name].  She continues on tamoxifen, with good tolerance.   She saw Dr. Hortencia [name] at [facility] on 05/20/2017.  He reviewed her recent films, discussed below, she had a chest CT at [facility] 05/20/2017, showing a small pericardial effusion and small bilateral pleural effusions.  Other findings included mild lower lobe reticulations, with minimal traction bronchiectasis, and groundglass opacities with no honeycombing. Pulmonary function tests obtained the same day showed an FEV1 of 1.5, with an FEV1/FVC ratio of 73.2 and a DLCO of 17.94, 52% of predicted. She had an echocardiogram completed on 06/04/2017 with results showing: Left ventricular fraction of 55%.   In [place] has had a CXR on 04/24/2017 with results revealing: left lung infiltrates.   She has had PET Scan completed on 04/07/2017 with results revealing: No evidence of recurrent or metastatic carcinoma. New bilateral multi-lobar infiltrates with lower lobe predominance, and small bilateral pleural effusions. Differential diagnosis includes infection, drug reaction, or less likely pulmonary edema.  She had a pap smear completed on 04/04/2017  [phone]) with results revealing: negative for intraepithelial lesions or malignancy. Subsequently, she had an endometrium biopsy ([id number]) with results revealing: fragments of benign atrophic endometrial glandular tissue. Fragments of benign endocervical glandular tissue and squamous epithelium. No atypia, dysplasia, hyperplasia or malignancy identified.  She has had US transvaginal and US pelvis completed on 03/21/2017 with results revealing: 1. Heterogeneous thickening of the endometrium, measuring up to 2 cm thickness, with scattered cystic appearing foci throughout. 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 Consider endometrial sampling, especially if any associated postmenopausal bleeding. 2. Both ovaries appear normal and there is no mass or free fluid seen within either adnexal region.   REVIEW OF SYSTEMS:  [name] reports that she hasn't been able to exercise as much due to the recent diagnosis. She is looking into hiring a Physiological scientist. 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 She did a 4 day and 3 day show recently. She notes that she has a raspy voice. She is scheduled for a D&C/Hysteroscopy on 08/04/2017 with Dr. Megan [name]. She reports that she has trouble bending over. She reports that she enjoyed Thanksgiving and spent the day cooking with her husband, which she enjoyed. She wears compression socks daily. 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 She has a Rheumatologist with Dr. Nadara [name] and Pulmonologist, Dr. Ruthann [name] at [facility]. She was last evaluated by Dr. Ruthann [name]. She has been off of prednisone for 4 weeks and her fatigue and insomnia has vastly improved. She is taking methotrexate for her pain and her pulmonologist who like her to be switched to another medication. She reports that she would like to go to [facility] for a second  opinion of her sclera derma diagnosis. She denies unusual headaches, visual changes, nausea, vomiting, or dizziness. There has been no unusual cough, phlegm production, or pleurisy. This been no change in bowel or bladder habits. She denies unexplained fatigue or unexplained weight loss, bleeding, rash, or fever. A detailed review of systems was otherwise stable.    BREAST [name] HISTORY: From the original intake note:  The patient had routine screening mammography at [facility] 08/27/2011, suggesting a potential abnormality in the left breast. Additional views 08/29/2011 confirmed an irregular mass with architectural or distortion measuring up to 4 cm in the left breast, without associated microcalcifications. By ultrasound this was irregular, and showed posterior shadowing. It measured 3.6 cm sonographically. There were no suspicious lymph nodes noted in the left axilla.  The same day the patient underwent ultrasound-guided biopsy of the left breast mass, showing ([id number]) and invasive ductal carcinoma, grade 1, which was 100% estrogen receptor and 97% progesterone receptor positive. The proliferation marker was 20%. There was no HER-2 amplification with a ratio by CISH of 1.09.  The patient had bilateral breast MRI 09/03/2011 showing a solitary 3.8 cm left breast lesion, and on 11/04/2011 underwent left lumpectomy and sentinel lymph node sampling with results and subsequent history as detailed below.   PAST MEDICAL HISTORY: Past Medical History:  Diagnosis Date  . Breastr [name], IDC, Left UOQ, clinical stage II Receptor +, Her 2 - 08/29/2011  . Depression   . Hyperlipidemia   . Hypothyroidism   . Osteoarthritis   . Positive ANA (antinuclear antibody)     PAST SURGICAL HISTORY: Past Surgical History:  Procedure Laterality Date  . BREAST LUMPECTOMY  11/04/11  . BUNIONECTOMY  2000   Left  . ECTOPIC PREGNANCY SURGERY  1986-88   s/p unilateral salpingectomy  . WISDOM TOOTH EXTRACTION       FAMILY HISTORY Family History  Problem Relation Age of Onset  . Heart disease Mother   . Heart failure Mother   . Heart disease Father   . Ovarian [name] Maternal Aunt   . Heart disease Paternal Uncle        multiple  . Autoimmune disease Sister   . Arrhythmia Sister   . Anemia Sister   . CAD Brother    the patient's father died at the age of 76 from myocardial infarction. The patient's mother died from congestive heart failure at the age of 66. The patient has one brother age 23 with prostate [name] diagnosed at age 52. The patient's sister is 47 as of 2013. There is no breast or ovarian [name] in the family  GYNECOLOGIC HISTORY:  (Reviewed 12/27/2013) She had menarche at around age 46. She is GX P1, first pregnancy to term age 37. She stopped having periods about 2003. She did not use hormone replacement  SOCIAL HISTORY:  (Updated August 2018). 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 Her husband Rush Landmark  has had prostate [name], status post radiation treatments in [place] about 5 years ago. Her daughter Lanelle Bal lives in [place] but is moving to [place] and is currently residing with the patient with her husband and 2 children while the new house is getting ready. 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 The patient attends the [employer].   ADVANCED DIRECTIVES: in place  HEALTH MAINTENANCE: (Updated 12/27/2013) Social History  Tobacco Use  . Smoking status: Never Smoker  . Smokeless tobacco: Never Used  Substance Use Topics  . Alcohol use: Yes    Comment: Drinks one mixed drink nightly  . Drug use: No     Colonoscopy: repeat due 2015  PAP: UTD ([facility] Ed[facility] Shell)  Bone density: June 2013, osteopenia with T score -1.4  Lipid panel: June 2014  Allergies  Allergen Reactions  . Epinephrine Other (See Comments)     Sever headaches  . Iodine Rash  . Oxycodone Anxiety  . Nickel Rash    Rash and blisters  . Other Rash    Hospital gown  . Penicillins Rash    Rash and blisters  . Sulfa Drugs Cross Reactors Rash    Current Outpatient Medications  Medication Sig Dispense Refill  . folic acid (FOLVITE) 1 MG tablet     . levothyroxine (SYNTHROID, LEVOTHROID) 88 MCG tablet Take 88 mcg by mouth daily before breakfast.     . Magnesium 400 MG CAPS Take by mouth daily.    . methotrexate (RHEUMATREX) 2.5 MG tablet     . tamoxifen (NOLVADEX) 20 MG tablet TAKE 1 TABLET BY MOUTH DAILY 90 tablet 2  . Turmeric Curcumin 500 MG CAPS Take by mouth.     No current facility-administered medications for this visit.     OBJECTIVE: Middle-aged white woman who appears younger than stated age  23:   06/09/17 1136  BP: (!) 151/73  Pulse: 90  Resp: 18  Temp: 98 F (36.7 C)  SpO2: 100%     Body mass index is 27.47 kg/m.    ECOG FS: 1 Filed Weights   06/09/17 1136  Weight: 158 lb 12.8 oz (72 kg)   Sclerae unicteric, EOMs intact No cervical or supraclavicular adenopathy Lungs minimal rales both bases, no dullness to percussion Heart regular rate and rhythm Abd soft, nontender, positive bowel sounds MSK no focal spinal tenderness, bilateral lower extremity compression stockings in place Neuro: nonfocal, well oriented, labile affect Breasts: The right breast is unremarkable.  The left breast is status post lumpectomy and radiation.  The skin changes on the breast are what would be expected from the radiation treatments.  Both axillae are benign.  LAB RESULTS: Lab Results  Component Value Date   WBC 7.3 06/09/2017   NEUTROABS 5.9 06/09/2017   HGB 11.1 (L) 06/09/2017   HCT 34.1 (L) 06/09/2017   MCV 93.9 06/09/2017   PLT 206 06/09/2017      Chemistry      Component Value Date/Time   NA 141 06/09/2017 1117   K 3.9 06/09/2017 1117   CL 109 (H) 12/14/2012 1253   CO2 23 06/09/2017 1117   BUN 11.5  06/09/2017 1117   CREATININE 1.2 (H) 06/09/2017 1117      Component Value Date/Time   CALCIUM 8.9 06/09/2017 1117   ALKPHOS 40 06/09/2017 1117   AST 35 (H) 06/09/2017 1117   ALT 19 06/09/2017 1117   BILITOT 0.37 06/09/2017 1117       STUDIES: She had an echocardiogram completed on 06/04/2017 at [facility] with results showing: LV fraction at 55%.   She has had a CXR on 04/24/2017 with results revealing: left lung infiltrates.   She has had US transvaginal and US pelvis completed on 03/21/2017 with results revealing: 1. Heterogeneous thickening of the endometrium, measuring up to 2 cm thickness, with scattered cystic appearing foci throughout. 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 Consider endometrial sampling, especially  if any associated postmenopausal bleeding. 2. Both ovaries appear normal and there is no mass or free fluid seen within either adnexal region.  She has had PET Scan completed on 04/07/2017 with results revealing: No evidence of recurrent or metastatic carcinoma. New bilateral multi-lobar infiltrates with lower lobe predominance, and small bilateral pleural effusions. Differential diagnosis includes infection, drug reaction, or less likely pulmonary edema.   ASSESSMENT: 66 y.o.  [name] woman   (1)  status post left lumpectomy 11/04/2011 for a pT2 pN0, stage IIA invasive ductal carcinoma, grade 2, strongly estrogen and progesterone receptor positive, HER-2 negative, with an MIB-1 of 20%.   (2)  completed radiation treatments June 2013    (3)  started tamoxifen mid-July 2013, to be continued until 2023  (a) endometrial thickening, status post multiple biopsies 04/04/2017 showing no atypia dysplasia or malignancy  (b) D and C pending 08/04/2017  (4) breast density category C  (a) status post breast MRI in April 2015 showing 2 abnormalities in the right breast; biopsy of both spots 11/19/2013 showed only fibrocystic PASH   (5) osteopenia  with a T score of -1.5 at the left femoral neck 04/18/2014  (a) on vitamin D supplementation, weightbearing exercise program, and tamoxifen  (6) scleroderma, with significant arthritis, thickened digits, lung changes, and possible swallowing changes.  PLAN:  [name] is now 5-1/2 years out from diagnosis of breast [name] with no evidence of disease activity or recurrence.  This is very favorable.  She continues on tamoxifen.  She has had problems with endometrial hyperplasia.  The concern of course is endometrial carcinoma.  There is no evidence of that by biopsy so far but she has a D&C scheduled late January.  We could switch to an aromatase inhibitor except for her osteopenia and except for the fact that she has significant arthritic problems which would be very difficult to sort out from the arthralgias and myalgias caused by aromatase inhibitors.  She very much wants to continue on tamoxifen until she completes 10 years and that is the plan at present  She is appropriately distressed about her diagnosis of scleroderma.  She is interested in seeking a second or third opinion and I think this is entirely reasonable.  She understands most physicians do not mind second and third opinions and do not release the patient simply because they are seeking advice elsewhere.  At this point in her disease I think it would be good if she had a clear idea of what her prognosis is, exactly what is going to be measured so she can tell if she is getting better or worse (since the treatment of her disease may cause a variety of symptoms which could make her feel worse even if she is getting better for example), and some idea of the course of the illness in general and the overall treatment plan  There was a request for CA 125 today.  I am not sure what the code needs to be for that.  I wrote down a concern regarding abdominal carcinomatosis.  I am not sure this will be paid for however.  From a breast [name] point of  view I need to see her on a once a year basis.  In general for support I am scheduling her to see me in about 4 months, but if she feels she does not need to see me at that time she will simply move the visit back  She knows to call for any other issues that may  develop before the next visit here. Gillis Boardley, Jennifer Dad, MD  06/09/17 11:59 AM Medical Oncology and Hematology [facility] [address] [place], [place] [address] Tel. [phone]    Fax. [phone]    This document serves as a record of services personally performed by Lurline Del, MD. It was created on his behalf by Steva Colder, a trained medical scribe. The creation of this record is based on the scribe's personal observations and the provider's statements to them.   I have reviewed the above documentation for accuracy and completeness, and I agree with the above.

## 2017-06-03 NOTE — Telephone Encounter (Signed)
Return call to patient. States she is scheduled for echocardiogram tomorrow;otherwise is ready to proceed with surgery.  Based on scheduled appointments she has with pulmonary and rheumatology,  she will need to schedule surgery for week of January 21.  Declined earlier surgery date..  Advised will need clearance from both pulmonary and rheumatology. Tentative surgery date of 1-21 or 08-05-17 pending surgical clearance and review by Dr Sabra Heck. Surgery consult scheduled for 07-16-17.

## 2017-06-04 ENCOUNTER — Other Ambulatory Visit: Payer: Self-pay | Admitting: Obstetrics & Gynecology

## 2017-06-04 DIAGNOSIS — I340 Nonrheumatic mitral (valve) insufficiency: Secondary | ICD-10-CM | POA: Diagnosis not present

## 2017-06-04 DIAGNOSIS — R0989 Other specified symptoms and signs involving the circulatory and respiratory systems: Secondary | ICD-10-CM | POA: Diagnosis not present

## 2017-06-04 DIAGNOSIS — I517 Cardiomegaly: Secondary | ICD-10-CM | POA: Diagnosis not present

## 2017-06-04 DIAGNOSIS — I371 Nonrheumatic pulmonary valve insufficiency: Secondary | ICD-10-CM | POA: Diagnosis not present

## 2017-06-04 NOTE — Telephone Encounter (Signed)
Orders have been placed.  Ok to close encounter.

## 2017-06-04 NOTE — Telephone Encounter (Signed)
Surgery posted for 08-04-17 at [facility] closed.

## 2017-06-09 ENCOUNTER — Telehealth: Payer: Self-pay | Admitting: Oncology

## 2017-06-09 ENCOUNTER — Ambulatory Visit (HOSPITAL_BASED_OUTPATIENT_CLINIC_OR_DEPARTMENT_OTHER): Payer: Medicare Other | Admitting: Oncology

## 2017-06-09 ENCOUNTER — Other Ambulatory Visit (HOSPITAL_BASED_OUTPATIENT_CLINIC_OR_DEPARTMENT_OTHER): Payer: Medicare Other

## 2017-06-09 ENCOUNTER — Encounter: Payer: Self-pay | Admitting: Obstetrics & Gynecology

## 2017-06-09 ENCOUNTER — Encounter: Payer: Self-pay | Admitting: *Deleted

## 2017-06-09 ENCOUNTER — Other Ambulatory Visit: Payer: Self-pay | Admitting: *Deleted

## 2017-06-09 VITALS — BP 151/73 | HR 90 | Temp 98.0°F | Resp 18 | Ht 63.75 in | Wt 158.8 lb

## 2017-06-09 DIAGNOSIS — R918 Other nonspecific abnormal finding of lung field: Secondary | ICD-10-CM | POA: Diagnosis not present

## 2017-06-09 DIAGNOSIS — C50412 Malignant neoplasm of upper-outer quadrant of left female breast: Secondary | ICD-10-CM

## 2017-06-09 DIAGNOSIS — M349 Systemic sclerosis, unspecified: Secondary | ICD-10-CM

## 2017-06-09 DIAGNOSIS — C762 Malignant neoplasm of abdomen: Secondary | ICD-10-CM | POA: Insufficient documentation

## 2017-06-09 DIAGNOSIS — Z170 Estrogen receptor positive status [ER+]: Principal | ICD-10-CM

## 2017-06-09 DIAGNOSIS — J90 Pleural effusion, not elsewhere classified: Secondary | ICD-10-CM

## 2017-06-09 DIAGNOSIS — C50419 Malignant neoplasm of upper-outer quadrant of unspecified female breast: Secondary | ICD-10-CM | POA: Diagnosis not present

## 2017-06-09 DIAGNOSIS — M8580 Other specified disorders of bone density and structure, unspecified site: Secondary | ICD-10-CM | POA: Diagnosis not present

## 2017-06-09 DIAGNOSIS — M1990 Unspecified osteoarthritis, unspecified site: Secondary | ICD-10-CM | POA: Diagnosis not present

## 2017-06-09 LAB — COMPREHENSIVE METABOLIC PANEL
ALBUMIN: 3.2 g/dL — AB (ref 3.5–5.0)
ALK PHOS: 40 U/L (ref 40–150)
ALT: 19 U/L (ref 0–55)
ANION GAP: 10 meq/L (ref 3–11)
AST: 35 U/L — ABNORMAL HIGH (ref 5–34)
BILIRUBIN TOTAL: 0.37 mg/dL (ref 0.20–1.20)
BUN: 11.5 mg/dL (ref 7.0–26.0)
CHLORIDE: 109 meq/L (ref 98–109)
CO2: 23 mEq/L (ref 22–29)
CREATININE: 1.2 mg/dL — AB (ref 0.6–1.1)
Calcium: 8.9 mg/dL (ref 8.4–10.4)
EGFR: 46 mL/min/{1.73_m2} — AB (ref >60)
Glucose: 101 mg/dl (ref 70–140)
POTASSIUM: 3.9 meq/L (ref 3.5–5.1)
Sodium: 141 mEq/L (ref 136–145)
Total Protein: 5.8 g/dL — ABNORMAL LOW (ref 6.4–8.3)

## 2017-06-09 LAB — CBC WITH DIFFERENTIAL/PLATELET
BASO%: 0.5 % (ref 0.0–2.0)
BASOS ABS: 0 10*3/uL (ref 0.0–0.1)
EOS ABS: 0.2 10*3/uL (ref 0.0–0.5)
EOS%: 2.9 % (ref 0.0–7.0)
HEMATOCRIT: 34.1 % — AB (ref 34.8–46.6)
HEMOGLOBIN: 11.1 g/dL — AB (ref 11.6–15.9)
LYMPH#: 0.6 10*3/uL — AB (ref 0.9–3.3)
LYMPH%: 7.9 % — ABNORMAL LOW (ref 14.0–49.7)
MCH: 30.6 pg (ref 25.1–34.0)
MCHC: 32.6 g/dL (ref 31.5–36.0)
MCV: 93.9 fL (ref 79.5–101.0)
MONO#: 0.6 10*3/uL (ref 0.1–0.9)
MONO%: 7.8 % (ref 0.0–14.0)
NEUT#: 5.9 10*3/uL (ref 1.5–6.5)
NEUT%: 80.9 % — AB (ref 38.4–76.8)
Platelets: 206 10*3/uL (ref 145–400)
RBC: 3.63 10*6/uL — ABNORMAL LOW (ref 3.70–5.45)
RDW: 15.9 % — AB (ref 11.2–14.5)
WBC: 7.3 10*3/uL (ref 3.9–10.3)

## 2017-06-09 LAB — CEA (IN HOUSE-CHCC): CEA (CHCC-IN HOUSE): 2.85 ng/mL (ref 0.00–5.00)

## 2017-06-09 NOTE — Telephone Encounter (Signed)
Gave patient AVS and calendar of upcoming March appointments.  °

## 2017-06-10 ENCOUNTER — Telehealth: Payer: Self-pay

## 2017-06-10 LAB — SEDIMENTATION RATE: Sedimentation Rate-Westergren: 18 mm/hr (ref 0–40)

## 2017-06-10 LAB — CA 125: Cancer Antigen (CA) 125: 52.5 U/mL — ABNORMAL HIGH (ref 0.0–38.1)

## 2017-06-10 LAB — CANCER ANTIGEN 27.29: CA 27.29: 33.1 U/mL (ref 0.0–38.6)

## 2017-06-10 NOTE — Telephone Encounter (Signed)
From [name] To Megan Salon, MD Sent 06/09/2017 6:16 PM  Dr. Sabra Heck,  The results of my Echocardiogram have been posted. Hopefully you can find them on Epic. Below are the comments from Dr. Hortencia Pilar.    Jannet Mantis, MD  06/09/2017 10:41 AM  PrintDelete  RE: Non-Urgent Medical Question  Ms. Skufca,   Echocardiography revealed only a small amount of fluid around the heart with good heart function. From a pulmonary perspective, you can proceed with the hysteroscopy. I will discuss transitioning your treatment from Methotrexate to Azathioprine with Dr. Flonnie Overman.   Dr. Ruthann Cancer   Let me know if you need anything else from me.   Jawanda Passey  [phone]   Responsible Party   Pool - [facility] Clinical Pool No one has taken responsibility for this message.  No actions have been taken on this message.   Routing to [place] for review.

## 2017-06-10 NOTE — Telephone Encounter (Signed)
Telephone encounter created to review with Dr.Miller. 

## 2017-06-11 ENCOUNTER — Encounter: Payer: Self-pay | Admitting: Internal Medicine

## 2017-06-11 ENCOUNTER — Ambulatory Visit (INDEPENDENT_AMBULATORY_CARE_PROVIDER_SITE_OTHER): Payer: Medicare Other | Admitting: Internal Medicine

## 2017-06-11 VITALS — BP 126/82 | HR 76 | Ht 64.5 in | Wt 157.2 lb

## 2017-06-11 DIAGNOSIS — M349 Systemic sclerosis, unspecified: Secondary | ICD-10-CM

## 2017-06-11 DIAGNOSIS — R1310 Dysphagia, unspecified: Secondary | ICD-10-CM

## 2017-06-11 DIAGNOSIS — Z86010 Personal history of colonic polyps: Secondary | ICD-10-CM | POA: Diagnosis not present

## 2017-06-11 NOTE — Progress Notes (Signed)
Patient ID: [name], female   DOB: 1950/08/05, 66 y.o.   MRN: [id number] HPI: [name] is a 66 year old female with past medical history of breast cancer on tamoxifen therapy, recent diagnosis of scleroderma with systemic sclerosis, hypothyroidism, hyperlipidemia who is seen in consult at the request of Dr. Brigitte Pulse to evaluate dysphagia and her history of colon polyps.  She is here alone today.  She reports that 3 or 4 months ago she was having some issues swallowing food and liquid.  Around this time she was dealing with a frequent cough with mucus and congestion.  She was treated with steroids and since being off of steroids has had no further issues with swallowing.  She states she has learned to chew her food better but has no issues with odynophagia or dysphagia.  No heartburn.  No chest pain.  She does have a history of colon polyps and reports she had one polyp removed which was benign in 2015.  This was performed in [facility] by Dr. Laverle Hobby.  She recalls a 5-year surveillance interval being recommended.  This would be 2020.  She reports her bowel habits are regular without blood or melena.  She denies abdominal pain.  No nausea or vomiting.  No family history of colon cancer.  She has been dealing with scleroderma symptoms over the last 18 months.  This has led to a diagnosis and she is seeing rheumatology at [facility] and waiting to get in with a scleroderma expert, Dr. Manuella Ghazi, at [facility].  She is currently on methotrexate therapy and I understand that they may consider CellCept.  She has had issues with rash, raynaud's phenomenon, finger and hand pain with joint swelling, skin tightness both in her hands and legs.  She is also had issues with pleural effusions but recently had a normal echocardiogram.  She follows with Dr. [name] for her breast cancer.  She is 6 years from diagnosis and doing well on tamoxifen.  Past Medical History:  Diagnosis Date  . Breastr cancer, IDC, Left  UOQ, clinical stage II Receptor +, Her 2 - 08/29/2011  . Depression   . Hyperlipidemia   . Hypothyroidism   . Osteoarthritis   . Positive ANA (antinuclear antibody)   . Scleroderma ([place])    Systemic Sclerosis    Past Surgical History:  Procedure Laterality Date  . BREAST LUMPECTOMY  11/04/11  . BUNIONECTOMY  2000   Left  . ECTOPIC PREGNANCY SURGERY  1986-88   s/p unilateral salpingectomy  . WISDOM TOOTH EXTRACTION      Outpatient Medications Prior to Visit  Medication Sig Dispense Refill  . folic acid (FOLVITE) 1 MG tablet     . levothyroxine (SYNTHROID, LEVOTHROID) 88 MCG tablet Take 88 mcg by mouth daily before breakfast.     . Magnesium 400 MG CAPS Take by mouth daily.    . methotrexate (RHEUMATREX) 2.5 MG tablet Take by mouth once a week.     . tamoxifen (NOLVADEX) 20 MG tablet TAKE 1 TABLET BY MOUTH DAILY 90 tablet 2  . Turmeric Curcumin 500 MG CAPS Take by mouth.     No facility-administered medications prior to visit.     Allergies  Allergen Reactions  . Epinephrine Other (See Comments)    Sever headaches  . Iodine Rash  . Oxycodone Anxiety  . Nickel Rash    Rash and blisters  . Other Rash    Hospital gown  . Penicillins Rash    Rash and blisters  .  Sulfa Drugs Cross Reactors Rash    Family History  Problem Relation Age of Onset  . Heart disease Mother   . Heart failure Mother   . Heart disease Father   . Ovarian cancer Maternal Aunt   . Heart disease Paternal Uncle        multiple  . Autoimmune disease Sister   . Arrhythmia Sister   . Anemia Sister   . CAD Brother     Social History   Tobacco Use  . Smoking status: Never Smoker  . Smokeless tobacco: Never Used  Substance Use Topics  . Alcohol use: Yes    Comment: Drinks one mixed drink nightly  . Drug use: No    ROS: As per history of present illness, otherwise negative  BP 126/82   Pulse 76   Ht 5' 4.5" (1.638 m)   Wt 157 lb 4 oz (71.3 kg)   BMI 26.58 kg/m  Constitutional:  Well-developed and well-nourished. No distress. HEENT: Normocephalic and atraumatic. Oropharynx is clear and moist. Conjunctivae are normal.  No scleral icterus. Neck: Neck supple. Trachea midline. Cardiovascular: Normal rate, regular rhythm and intact distal pulses. No M/R/G Pulmonary/chest: Effort normal and breath sounds normal. No wheezing, rales or rhonchi. Abdominal: Soft, nontender, nondistended. Bowel sounds active throughout. Extremities: no clubbing, cyanosis, or edema, taught skin bilateral hands with loss of normal skin folding, limited range of motion with flexing fingers Neurological: Alert and oriented to person place and time. Skin: Skin is warm and dry.  Psychiatric: Normal mood and affect. Behavior is normal.  RELEVANT LABS AND IMAGING: CBC    Component Value Date/Time   WBC 7.3 06/09/2017 1117   WBC 5.1 10/31/2011 0913   RBC 3.63 (L) 06/09/2017 1117   RBC 4.71 10/31/2011 0913   HGB 11.1 (L) 06/09/2017 1117   HCT 34.1 (L) 06/09/2017 1117   PLT 206 06/09/2017 1117   MCV 93.9 06/09/2017 1117   MCH 30.6 06/09/2017 1117   MCH 30.6 10/31/2011 0913   MCHC 32.6 06/09/2017 1117   MCHC 34.0 10/31/2011 0913   RDW 15.9 (H) 06/09/2017 1117   LYMPHSABS 0.6 (L) 06/09/2017 1117   MONOABS 0.6 06/09/2017 1117   EOSABS 0.2 06/09/2017 1117   BASOSABS 0.0 06/09/2017 1117    CMP     Component Value Date/Time   NA 141 06/09/2017 1117   K 3.9 06/09/2017 1117   CL 109 (H) 12/14/2012 1253   CO2 23 06/09/2017 1117   GLUCOSE 101 06/09/2017 1117   GLUCOSE 94 12/14/2012 1253   BUN 11.5 06/09/2017 1117   CREATININE 1.2 (H) 06/09/2017 1117   CALCIUM 8.9 06/09/2017 1117   PROT 5.8 (L) 06/09/2017 1117   ALBUMIN 3.2 (L) 06/09/2017 1117   AST 35 (H) 06/09/2017 1117   ALT 19 06/09/2017 1117   ALKPHOS 40 06/09/2017 1117   BILITOT 0.37 06/09/2017 1117   GFRNONAA 61 (L) 10/31/2011 0913   GFRAA 70 (L) 10/31/2011 0913    ASSESSMENT/PLAN: 66 year old female with past medical history  of breast cancer on tamoxifen therapy, recent diagnosis of scleroderma with systemic sclerosis, hypothyroidism, hyperlipidemia who is seen in consult at the request of Dr. Brigitte Pulse to evaluate dysphagia and her history of colon polyps.  1.  Dysphagia, now resolved --her dysphagia symptoms resolved with discontinuation of prednisone.  Query esophagitis or even candidiasis.  We discussed how scleroderma can certainly affect the esophagus both from a motility perspective but also from a stricturing phenomenon.  I recommended that we get a  baseline barium esophagram with tablet.  I am not recommending upper endoscopy at present given her lack of overall symptoms.  She will let me know if her esophageal symptoms return.  2.  Colon cancer screening/history of colon polyps --we will request records from prior GI procedures.  Plan surveillance colonoscopy in 2020  3.  Scleroderma --following closely with rheumatology at [facility].  Currently on methotrexate therapy and considering other immunosuppressive's.  For now she will follow-up as needed     XH:FSFS, Gwyndolyn Saxon, [place] [place], [place] [address]

## 2017-06-11 NOTE — Patient Instructions (Signed)
You have been scheduled for a Barium Esophogram at [facility] (1st floor of the hospital) on Friday, 07/18/17 at 9:30 am. Please arrive 15 minutes prior to your appointment for registration. Make certain not to have anything to eat or drink 6 hours prior to your test. If you need to reschedule for any reason, please contact radiology at [phone] to do so. __________________________________________________________________ A barium swallow is an examination that concentrates on views of the esophagus. This tends to be a double contrast exam (barium and two liquids which, when combined, create a gas to distend the wall of the oesophagus) or single contrast (non-ionic iodine based). The study is usually tailored to your symptoms so a good history is essential. Attention is paid during the study to the form, structure and configuration of the esophagus, looking for functional disorders (such as aspiration, dysphagia, achalasia, motility and reflux) EXAMINATION You may be asked to change into a gown, depending on the type of swallow being performed. A radiologist and radiographer will perform the procedure. The radiologist will advise you of the type of contrast selected for your procedure and direct you during the exam. You will be asked to stand, sit or lie in several different positions and to hold a small amount of fluid in your mouth before being asked to swallow while the imaging is performed .In some instances you may be asked to swallow barium coated marshmallows to assess the motility of a solid food bolus. The exam can be recorded as a digital or video fluoroscopy procedure. POST PROCEDURE It will take 1-2 days for the barium to pass through your system. To facilitate this, it is important, unless otherwise directed, to increase your fluids for the next 24-48hrs and to resume your normal diet.  This test typically takes about 30 minutes to perform.    If you are age 84 or older, your  body mass index should be between 23-30. Your Body mass index is 26.58 kg/m. If this is out of the aforementioned range listed, please consider follow up with your Primary Care Provider.  If you are age 55 or younger, your body mass index should be between 19-25. Your Body mass index is 26.58 kg/m. If this is out of the aformentioned range listed, please consider follow up with your Primary Care Provider.   __________________________________________________________________________________

## 2017-06-11 NOTE — Telephone Encounter (Signed)
Ok to let pt know I've reviewed this and the notes.  Thanks for the message.

## 2017-06-12 ENCOUNTER — Telehealth: Payer: Self-pay | Admitting: Obstetrics & Gynecology

## 2017-06-12 NOTE — Telephone Encounter (Addendum)
Patient returning call, see closed encounter dated 06/10/17. Advised patient Dr. Sabra Heck has reviewed MyChart message and notes.   Patient confirmed upcoming appointments with Dr. Sabra Heck. States she is unsure about surgery date. Discussed 08/04/17 or 08/05/17. Advised per review of Epic, surgery scheduled for 08/04/17, will review with Kandace Blitz, RN and return call with any additional information. Patient is agreeable.    Routing to S. Orvan Seen, RN

## 2017-06-12 NOTE — Telephone Encounter (Signed)
Left message to call Russia Scheiderer at [phone]. 

## 2017-06-12 NOTE — Telephone Encounter (Signed)
Patient says she is returning a call to the nurse.

## 2017-06-17 NOTE — Telephone Encounter (Signed)
Call to patient. Confirmed surgery date of 08-04-17 at 1100 at [facility] tentative of 1100 may change depending on other cases. Surgery instruction sheet reviewed and printed instructions will be mailed to patient.   Routing to provider for final review. Patient agreeable to disposition. Will close encounter.

## 2017-06-18 NOTE — Telephone Encounter (Signed)
Left message to call Kaitlyn at [phone]. 

## 2017-06-18 NOTE — Telephone Encounter (Signed)
Spoke with patient. Advised of message as seen below from [place]. Patient verbalizes understanding.

## 2017-06-19 DIAGNOSIS — M545 Low back pain: Secondary | ICD-10-CM | POA: Diagnosis not present

## 2017-06-19 DIAGNOSIS — M542 Cervicalgia: Secondary | ICD-10-CM | POA: Diagnosis not present

## 2017-06-24 DIAGNOSIS — G629 Polyneuropathy, unspecified: Secondary | ICD-10-CM | POA: Diagnosis not present

## 2017-06-24 DIAGNOSIS — Z6826 Body mass index (BMI) 26.0-26.9, adult: Secondary | ICD-10-CM | POA: Diagnosis not present

## 2017-06-24 DIAGNOSIS — M3489 Other systemic sclerosis: Secondary | ICD-10-CM | POA: Diagnosis not present

## 2017-06-24 DIAGNOSIS — G990 Autonomic neuropathy in diseases classified elsewhere: Secondary | ICD-10-CM | POA: Diagnosis not present

## 2017-06-24 DIAGNOSIS — E038 Other specified hypothyroidism: Secondary | ICD-10-CM | POA: Diagnosis not present

## 2017-06-24 DIAGNOSIS — J849 Interstitial pulmonary disease, unspecified: Secondary | ICD-10-CM | POA: Diagnosis not present

## 2017-06-24 DIAGNOSIS — Z79899 Other long term (current) drug therapy: Secondary | ICD-10-CM | POA: Diagnosis not present

## 2017-06-24 DIAGNOSIS — R1319 Other dysphagia: Secondary | ICD-10-CM | POA: Diagnosis not present

## 2017-06-24 DIAGNOSIS — R5383 Other fatigue: Secondary | ICD-10-CM | POA: Diagnosis not present

## 2017-06-26 DIAGNOSIS — M542 Cervicalgia: Secondary | ICD-10-CM | POA: Diagnosis not present

## 2017-06-27 DIAGNOSIS — M542 Cervicalgia: Secondary | ICD-10-CM | POA: Diagnosis not present

## 2017-07-01 DIAGNOSIS — M542 Cervicalgia: Secondary | ICD-10-CM | POA: Diagnosis not present

## 2017-07-16 ENCOUNTER — Encounter: Payer: Self-pay | Admitting: Obstetrics & Gynecology

## 2017-07-16 ENCOUNTER — Other Ambulatory Visit: Payer: Self-pay

## 2017-07-16 ENCOUNTER — Ambulatory Visit (INDEPENDENT_AMBULATORY_CARE_PROVIDER_SITE_OTHER): Payer: Medicare Other | Admitting: Obstetrics & Gynecology

## 2017-07-16 VITALS — BP 128/78 | HR 70 | Resp 14 | Wt 151.0 lb

## 2017-07-16 DIAGNOSIS — M349 Systemic sclerosis, unspecified: Secondary | ICD-10-CM | POA: Diagnosis not present

## 2017-07-16 DIAGNOSIS — R9389 Abnormal findings on diagnostic imaging of other specified body structures: Secondary | ICD-10-CM | POA: Diagnosis not present

## 2017-07-16 NOTE — Progress Notes (Signed)
67 y.o. [id number] MarriedCaucasian female here for discussion of recommended procedure.  Pt originally seen 04/04/17 due to 2cm appearing endometrium on ultrasound and h/o current tamoxifen use.  Hysteroscopy with possible polyp resection, D&C was planned.  At that time, pt was undergoing evaluation for auto-immune disorder.  She has been in contact with my office but did not want to schedule until diagnosis and treatment was bettered evaluated.  Also, I requested that she has pulmonary clearance due to possible involvement with lungs as well.  She has been seeing rheumatologist and pulmonologist at [facility].  She's had a variety of testing including pumonary function testing and an echo performed.  She has been cleared by pulmonology at [facility].  Pulmonologist:  Hortencia Pilar, MD.    She is here to review testing and procedures done since last visit as well as discuss recommended procedure for abnormal appearing endometrium.  From gyn perspective, has h/o breast cancer and is on Tamoxifen.  Endometrium thickened to 2cm.  Hysteroscopy with possible polyp resection and D&C.  Procedure discussed with patient.  Recovery and pain management discussed.  Risks discussed including but not limited to bleeding, rare risk of transfusion, infection, 1% risk of uterine perforation with risks of fluid deficit causing cardiac arrythmia, cerebral swelling and/or need to stop procedure early.  Fluid emboli and rare risk of death discussed.  DVT/PE, rare risk of risk of bowel/bladder/ureteral/vascular injury.  Patient aware if pathology abnormal she may need additional treatment.  All questions answered.    Since I saw her last, she has seen two rheumatologists at [facility].  Has been diagnosed with scleroderma.  Was on Methotrexate but this was stopped.  Now on Cellcept but having side effects and is slowly weaning down herself.  Already feels better on lower dose.  Will see new provider on Friday, 07/22/17.  Will also be seeing another  rheumatologist at [facility] in [place] in February.  Seeing Dr. Jola Schmidt.  Had PET scan ordered by Dr. Doris Cheadle 06/11/17 showing bilateral multi-lobar infiltrates without evidence of metastatic disease.  Echo done at [facility] 06/04/17  showed EF >55% with moderate LVH.    Having barium swallow done with Dr. Hilarie Fredrickson 07/18/17.  Doing this at [facility] imaging.   Pap negative 04/04/17.    Ob Hx:   No LMP recorded. Patient is postmenopausal.          Sexually active: Yes.   Birth control: post menopausal  Last pap: 04/04/17 negative, HR HPV negative  Last MMG: 11/14/16 BIRADS 2 benign  Tobacco: Never smoker   Past Surgical History:  Procedure Laterality Date  . BREAST LUMPECTOMY  11/04/11  . BUNIONECTOMY  2000   Left  . ECTOPIC PREGNANCY SURGERY  1986-88   s/p unilateral salpingectomy  . WISDOM TOOTH EXTRACTION      Past Medical History:  Diagnosis Date  . 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 Breastr cancer, IDC, Left UOQ, clinical stage II Receptor +, Her 2 - 08/29/2011  . Depression   . Hyperlipidemia   . Hypothyroidism   . Osteoarthritis   . Positive ANA (antinuclear antibody)   . Scleroderma ([facility])    Systemic Sclerosis    Allergies: Epinephrine; Iodine; Oxycodone; Nickel; Other; Penicillins; and Sulfa drugs cross reactors  Current Outpatient Medications  Medication Sig Dispense Refill  . folic acid (FOLVITE) 1 MG tablet     . furosemide (LASIX) 20 MG tablet Take by mouth.    . Magnesium 400 MG CAPS Take by mouth daily.    [name]  meloxicam (MOBIC) 15 MG tablet     . mycophenolate (CELLCEPT) 500 MG tablet Take by mouth.    . SYNTHROID 100 MCG tablet     . tamoxifen (NOLVADEX) 20 MG tablet TAKE 1 TABLET BY MOUTH DAILY 90 tablet 2  . Turmeric Curcumin 500 MG CAPS Take by mouth.     No current facility-administered medications for this visit.     ROS: Respiratory: negative Cardiovascular: negative Gastrointestinal: negative Genitourinary:negative Neurological:  negative  Exam:    BP 128/78 (BP Location: Right Arm, Patient Position: Sitting, Cuff Size: Normal)   Pulse 70   Resp 14   Wt 151 lb (68.5 kg)   BMI 25.52 kg/m   General appearance: alert and cooperative Head: Normocephalic, without obvious abnormality, atraumatic Neck: no adenopathy, supple, symmetrical, trachea midline and thyroid not enlarged, symmetric, no tenderness/mass/nodules Lungs: clear to auscultation bilaterally Heart: regular rate and rhythm, S1, S2 normal, no murmur, click, rub or gallop Abdomen: soft, non-tender; bowel sounds normal; no masses,  no organomegaly Extremities: extremities normal, atraumatic, no cyanosis or edema Skin: Skin color, texture, turgor normal. No rashes or lesions Lymph nodes: Cervical, supraclavicular, and axillary nodes normal. no inguinal nodes palpated Neurologic: Grossly normal  Pelvic: External genitalia:  no lesions              Urethra: normal appearing urethra with no masses, tenderness or lesions              Bartholins and Skenes: normal                 Vagina: normal appearing vagina with normal color and discharge, no lesions              Cervix: normal appearance              Pap taken: No.        Bimanual Exam:  Uterus:  uterus is normal size, shape, consistency and nontender                                      Adnexa:    normal adnexa in size, nontender and no masses                                        A: thickened endometrium (without vaginal bleeding) in pt on Tamoxifen  H/O right breast cancer 2013 Scleroderma Rayanud's  P:  Hysteroscopy with possible polyp resection, D&C planned No prescriptions given.  Will use Vicodin post operatively.. Medications/Vitamins reviewed.  Pt knows needs to stop meloxicam the day before the procedure. Hysteroscopy brochure given with pre and post op instructions.  ~30 minutes spent with patient >50% of time was in face to face discussion of above.

## 2017-07-17 DIAGNOSIS — M542 Cervicalgia: Secondary | ICD-10-CM | POA: Diagnosis not present

## 2017-07-18 ENCOUNTER — Ambulatory Visit (HOSPITAL_COMMUNITY)
Admission: RE | Admit: 2017-07-18 | Discharge: 2017-07-18 | Disposition: A | Discharge status: Home / Self Care | Payer: Medicare Other | Source: Ambulatory Visit | Attending: Internal Medicine | Admitting: Internal Medicine

## 2017-07-18 DIAGNOSIS — K449 Diaphragmatic hernia without obstruction or gangrene: Secondary | ICD-10-CM | POA: Insufficient documentation

## 2017-07-18 DIAGNOSIS — M349 Systemic sclerosis, unspecified: Secondary | ICD-10-CM | POA: Diagnosis not present

## 2017-07-18 DIAGNOSIS — K224 Dyskinesia of esophagus: Secondary | ICD-10-CM | POA: Diagnosis not present

## 2017-07-21 ENCOUNTER — Other Ambulatory Visit: Payer: Self-pay

## 2017-07-21 DIAGNOSIS — I872 Venous insufficiency (chronic) (peripheral): Secondary | ICD-10-CM

## 2017-07-22 ENCOUNTER — Telehealth: Payer: Self-pay | Admitting: *Deleted

## 2017-07-22 DIAGNOSIS — M349 Systemic sclerosis, unspecified: Secondary | ICD-10-CM | POA: Diagnosis not present

## 2017-07-22 DIAGNOSIS — R0600 Dyspnea, unspecified: Secondary | ICD-10-CM | POA: Diagnosis not present

## 2017-07-22 DIAGNOSIS — R600 Localized edema: Secondary | ICD-10-CM | POA: Diagnosis not present

## 2017-07-22 DIAGNOSIS — I7300 Raynaud's syndrome without gangrene: Secondary | ICD-10-CM | POA: Diagnosis not present

## 2017-07-22 NOTE — Telephone Encounter (Signed)
Return call to patient. Left message to call back.  Per DPR can leave message on voice mail. Left message with new time and location of surgery. Surgery still on 08-04-17 but at 845 at [facility]. Advised does NOT need to go to PAT appt that was scheduled for 07-24-17 at [facility].  Left message to call back to confirm.

## 2017-07-22 NOTE — Telephone Encounter (Signed)
Patient returned Sally's call.

## 2017-07-22 NOTE — Telephone Encounter (Signed)
Surgery time adjusted to [facility] [place] on scheduled date of 08-04-17, Call to patient to advise. Left message to call back.

## 2017-07-24 ENCOUNTER — Encounter (HOSPITAL_BASED_OUTPATIENT_CLINIC_OR_DEPARTMENT_OTHER): Payer: Self-pay | Admitting: *Deleted

## 2017-07-24 ENCOUNTER — Inpatient Hospital Stay (HOSPITAL_COMMUNITY)
Admission: RE | Admit: 2017-07-24 | Discharge: 2017-07-24 | Disposition: A | Discharge status: Home / Self Care | Payer: Medicare Other | Source: Ambulatory Visit

## 2017-07-24 DIAGNOSIS — M542 Cervicalgia: Secondary | ICD-10-CM | POA: Diagnosis not present

## 2017-07-25 ENCOUNTER — Other Ambulatory Visit: Payer: Self-pay

## 2017-07-25 ENCOUNTER — Encounter: Payer: Self-pay | Admitting: Internal Medicine

## 2017-07-25 ENCOUNTER — Encounter (HOSPITAL_BASED_OUTPATIENT_CLINIC_OR_DEPARTMENT_OTHER): Payer: Self-pay | Admitting: *Deleted

## 2017-07-25 NOTE — Progress Notes (Addendum)
SPOKE W/ PT VIA PHONE FOR PRE-OP INTERIVEW.  NPO AFTER MN.  ARRIVE AT 0630.  CURRENT CHEST CT IN CHART AND Epic.   GETTING CBC AND CMET DONE Wednesday 07-30-2017 AT 1100.  WILL TAKE SYNTHROID AM DOS W/ SIPS OF WATER.    ADDENDUM:  CALLED AND SPOKE W/ PT VIA PHONE FOR SURGERY START TIME CHANGE, TODAY Tuesday 07-29-2017 AT 4037.  PT VERBALIZED UNDERSTANDING TO ARRIVE AT 0810 FOR SURGERY START TIME 1010, INSTEAD OF 0630.  ADDENDUM:  SPOKE W/ DR HATCHETT MDA VIA PHONE AND REVIEWED PT CHART TODAY , Wednesday 07-30-2017 AT 1050.  ASKED IF PT WAS HARD STICK, PT DID NOT STATE ANY ISSUES WITH IV'S.  PER DR HATCHETT MDA , OK TO PROCEED.

## 2017-07-25 NOTE — Telephone Encounter (Signed)
Call to patient to advise of surgery time change. Patient states she received message and has talked to PAT at [facility]. She apologized for not calling back.   Routing to provider for final review. Patient agreeable to disposition. Will close encounter.

## 2017-07-28 ENCOUNTER — Telehealth: Payer: Self-pay | Admitting: Obstetrics & Gynecology

## 2017-07-28 NOTE — Telephone Encounter (Signed)
Please let patient know I received her email  Please inform her that these symptoms are not, and would not be expected to result just from the barium esophagram They can relate to reflux and dysmotility however and thus I would recommend she try ranitidine 150 mg twice daily for 7-10 days and then she can stop. Let me know if symptoms persist

## 2017-07-28 NOTE — Telephone Encounter (Signed)
Called to patient regarding surgery time change. She is aware her surgery scheduled Monday 08/04/17 is at 10:00 am with an 8:00 am arrival time at the hospital.

## 2017-07-28 NOTE — Telephone Encounter (Signed)
Routing to provider for final review. Patient agreeable to disposition. Will close encounter.     

## 2017-07-30 ENCOUNTER — Encounter (HOSPITAL_COMMUNITY)
Admission: RE | Admit: 2017-07-30 | Discharge: 2017-07-30 | Disposition: A | Discharge status: Home / Self Care | Payer: Medicare Other | Source: Ambulatory Visit | Attending: Obstetrics & Gynecology | Admitting: Obstetrics & Gynecology

## 2017-07-30 DIAGNOSIS — C50919 Malignant neoplasm of unspecified site of unspecified female breast: Secondary | ICD-10-CM | POA: Diagnosis not present

## 2017-07-30 DIAGNOSIS — Z79899 Other long term (current) drug therapy: Secondary | ICD-10-CM | POA: Diagnosis not present

## 2017-07-30 DIAGNOSIS — M349 Systemic sclerosis, unspecified: Secondary | ICD-10-CM | POA: Diagnosis not present

## 2017-07-30 DIAGNOSIS — Z01812 Encounter for preprocedural laboratory examination: Secondary | ICD-10-CM | POA: Diagnosis not present

## 2017-07-30 LAB — CBC
HEMATOCRIT: 37.1 % (ref 36.0–46.0)
HEMOGLOBIN: 11.8 g/dL — AB (ref 12.0–15.0)
MCH: 29.3 pg (ref 26.0–34.0)
MCHC: 31.8 g/dL (ref 30.0–36.0)
MCV: 92.1 fL (ref 78.0–100.0)
Platelets: 219 10*3/uL (ref 150–400)
RBC: 4.03 MIL/uL (ref 3.87–5.11)
RDW: 14.2 % (ref 11.5–15.5)
WBC: 7.7 10*3/uL (ref 4.0–10.5)

## 2017-07-30 LAB — COMPREHENSIVE METABOLIC PANEL
ALK PHOS: 45 U/L (ref 38–126)
ALT: 17 U/L (ref 14–54)
ANION GAP: 7 (ref 5–15)
AST: 32 U/L (ref 15–41)
Albumin: 3.9 g/dL (ref 3.5–5.0)
BILIRUBIN TOTAL: 0.6 mg/dL (ref 0.3–1.2)
BUN: 13 mg/dL (ref 6–20)
CALCIUM: 8.8 mg/dL — AB (ref 8.9–10.3)
CO2: 25 mmol/L (ref 22–32)
Chloride: 103 mmol/L (ref 101–111)
Creatinine, Ser: 1.2 mg/dL — ABNORMAL HIGH (ref 0.44–1.00)
GFR calc Af Amer: 53 mL/min — ABNORMAL LOW (ref >60)
GFR calc non Af Amer: 45 mL/min — ABNORMAL LOW (ref >60)
Glucose, Bld: 102 mg/dL — ABNORMAL HIGH (ref 65–99)
POTASSIUM: 4.3 mmol/L (ref 3.5–5.1)
SODIUM: 135 mmol/L (ref 135–145)
TOTAL PROTEIN: 6.3 g/dL — AB (ref 6.5–8.1)

## 2017-07-30 NOTE — Progress Notes (Signed)
We have received patient's colonoscopy from [place] dated 07/22/14.   Findings: Colon: The preparation was excellent. No diverticulosis was noted in the colon. A 4 mm polyp was noted in the splenic flexure and was removed with cold biopsy forceps.  Plan:  Routine post colonoscopy orders. Repeat colonoscopy in 5 years I will review the results of the biopsies with the patient by the patietn report system  Pilar Grammes III MD  Per Dr Hilarie Fredrickson, 07/28/17, one polyp 07/2014, change recall to 07/2019.

## 2017-07-31 DIAGNOSIS — M349 Systemic sclerosis, unspecified: Secondary | ICD-10-CM | POA: Diagnosis not present

## 2017-07-31 DIAGNOSIS — R29898 Other symptoms and signs involving the musculoskeletal system: Secondary | ICD-10-CM | POA: Diagnosis not present

## 2017-07-31 DIAGNOSIS — M79643 Pain in unspecified hand: Secondary | ICD-10-CM | POA: Diagnosis not present

## 2017-07-31 DIAGNOSIS — M25641 Stiffness of right hand, not elsewhere classified: Secondary | ICD-10-CM | POA: Diagnosis not present

## 2017-07-31 DIAGNOSIS — M25642 Stiffness of left hand, not elsewhere classified: Secondary | ICD-10-CM | POA: Diagnosis not present

## 2017-07-31 DIAGNOSIS — M79646 Pain in unspecified finger(s): Secondary | ICD-10-CM | POA: Diagnosis not present

## 2017-08-01 DIAGNOSIS — M542 Cervicalgia: Secondary | ICD-10-CM | POA: Diagnosis not present

## 2017-08-04 ENCOUNTER — Ambulatory Visit (HOSPITAL_BASED_OUTPATIENT_CLINIC_OR_DEPARTMENT_OTHER): Payer: Medicare Other | Admitting: Anesthesiology

## 2017-08-04 ENCOUNTER — Encounter (HOSPITAL_BASED_OUTPATIENT_CLINIC_OR_DEPARTMENT_OTHER)
Admission: RE | Disposition: A | Discharge status: Home / Self Care | Payer: Self-pay | Source: Ambulatory Visit | Attending: Obstetrics & Gynecology

## 2017-08-04 ENCOUNTER — Ambulatory Visit (HOSPITAL_COMMUNITY)
Admission: RE | Admit: 2017-08-04 | Discharge: 2017-08-04 | Disposition: A | Discharge status: Home / Self Care | Payer: Medicare Other | Source: Ambulatory Visit | Attending: Obstetrics & Gynecology | Admitting: Obstetrics & Gynecology

## 2017-08-04 ENCOUNTER — Encounter (HOSPITAL_BASED_OUTPATIENT_CLINIC_OR_DEPARTMENT_OTHER): Payer: Self-pay

## 2017-08-04 DIAGNOSIS — M1990 Unspecified osteoarthritis, unspecified site: Secondary | ICD-10-CM | POA: Diagnosis not present

## 2017-08-04 DIAGNOSIS — Z880 Allergy status to penicillin: Secondary | ICD-10-CM | POA: Diagnosis not present

## 2017-08-04 DIAGNOSIS — E039 Hypothyroidism, unspecified: Secondary | ICD-10-CM | POA: Insufficient documentation

## 2017-08-04 DIAGNOSIS — N8500 Endometrial hyperplasia, unspecified: Secondary | ICD-10-CM | POA: Diagnosis not present

## 2017-08-04 DIAGNOSIS — Z923 Personal history of irradiation: Secondary | ICD-10-CM | POA: Insufficient documentation

## 2017-08-04 DIAGNOSIS — N840 Polyp of corpus uteri: Secondary | ICD-10-CM | POA: Diagnosis not present

## 2017-08-04 DIAGNOSIS — Z79899 Other long term (current) drug therapy: Secondary | ICD-10-CM | POA: Insufficient documentation

## 2017-08-04 DIAGNOSIS — C50412 Malignant neoplasm of upper-outer quadrant of left female breast: Secondary | ICD-10-CM | POA: Diagnosis not present

## 2017-08-04 DIAGNOSIS — I7300 Raynaud's syndrome without gangrene: Secondary | ICD-10-CM | POA: Diagnosis not present

## 2017-08-04 DIAGNOSIS — F329 Major depressive disorder, single episode, unspecified: Secondary | ICD-10-CM | POA: Diagnosis not present

## 2017-08-04 DIAGNOSIS — Z79810 Long term (current) use of selective estrogen receptor modulators (SERMs): Secondary | ICD-10-CM | POA: Diagnosis not present

## 2017-08-04 DIAGNOSIS — Z8249 Family history of ischemic heart disease and other diseases of the circulatory system: Secondary | ICD-10-CM | POA: Insufficient documentation

## 2017-08-04 DIAGNOSIS — M8580 Other specified disorders of bone density and structure, unspecified site: Secondary | ICD-10-CM | POA: Diagnosis not present

## 2017-08-04 DIAGNOSIS — E785 Hyperlipidemia, unspecified: Secondary | ICD-10-CM | POA: Diagnosis not present

## 2017-08-04 DIAGNOSIS — M3481 Systemic sclerosis with lung involvement: Secondary | ICD-10-CM | POA: Diagnosis not present

## 2017-08-04 DIAGNOSIS — R9389 Abnormal findings on diagnostic imaging of other specified body structures: Secondary | ICD-10-CM | POA: Diagnosis not present

## 2017-08-04 HISTORY — DX: Contracture, unspecified hand: M24.549

## 2017-08-04 HISTORY — PX: DILATATION & CURETTAGE/HYSTEROSCOPY WITH MYOSURE: SHX6511

## 2017-08-04 HISTORY — DX: Rash and other nonspecific skin eruption: R21

## 2017-08-04 HISTORY — DX: Raynaud's syndrome without gangrene: I73.00

## 2017-08-04 HISTORY — DX: Other specified conditions associated with female genital organs and menstrual cycle: N94.89

## 2017-08-04 HISTORY — DX: Systemic sclerosis, unspecified: M34.9

## 2017-08-04 HISTORY — DX: Other specified disorders of bone density and structure, unspecified site: M85.80

## 2017-08-04 HISTORY — DX: Changes in skin texture: R23.4

## 2017-08-04 HISTORY — DX: Personal history of irradiation: Z92.3

## 2017-08-04 SURGERY — DILATATION & CURETTAGE/HYSTEROSCOPY WITH MYOSURE
Anesthesia: General

## 2017-08-04 MED ORDER — HYDROCODONE-ACETAMINOPHEN 5-325 MG PO TABS: 1.0000 | ORAL_TABLET | Freq: Four times a day (QID) | ORAL | 0 refills | Status: DC | PRN

## 2017-08-04 MED ORDER — LIDOCAINE HCL 1 % IJ SOLN
INTRAMUSCULAR | Status: DC | PRN
  Administered 2017-08-04: 10 mL

## 2017-08-04 MED ORDER — FENTANYL CITRATE (PF) 100 MCG/2ML IJ SOLN
INTRAMUSCULAR | Status: DC | PRN
  Administered 2017-08-04 (×2): 50 ug via INTRAVENOUS

## 2017-08-04 MED ORDER — KETOROLAC TROMETHAMINE 30 MG/ML IJ SOLN
INTRAMUSCULAR | Status: DC | PRN
  Administered 2017-08-04: 15 mg via INTRAVENOUS

## 2017-08-04 MED ORDER — LIDOCAINE 2% (20 MG/ML) 5 ML SYRINGE
INTRAMUSCULAR | Status: DC | PRN
  Administered 2017-08-04: 60 mg via INTRAVENOUS

## 2017-08-04 MED ORDER — FENTANYL CITRATE (PF) 100 MCG/2ML IJ SOLN
INTRAMUSCULAR | Status: AC
  Filled 2017-08-04: qty 2

## 2017-08-04 MED ORDER — ONDANSETRON HCL 4 MG/2ML IJ SOLN
INTRAMUSCULAR | Status: AC
  Filled 2017-08-04: qty 2

## 2017-08-04 MED ORDER — MIDAZOLAM HCL 2 MG/2ML IJ SOLN
INTRAMUSCULAR | Status: DC | PRN
  Administered 2017-08-04: 1 mg via INTRAVENOUS

## 2017-08-04 MED ORDER — DEXAMETHASONE SODIUM PHOSPHATE 10 MG/ML IJ SOLN
INTRAMUSCULAR | Status: AC
  Filled 2017-08-04: qty 1

## 2017-08-04 MED ORDER — PROPOFOL 10 MG/ML IV BOLUS
INTRAVENOUS | Status: AC
  Filled 2017-08-04: qty 20

## 2017-08-04 MED ORDER — PROMETHAZINE HCL 25 MG/ML IJ SOLN
6.2500 mg | INTRAMUSCULAR | Status: DC | PRN
  Filled 2017-08-04: qty 1

## 2017-08-04 MED ORDER — DEXAMETHASONE SODIUM PHOSPHATE 10 MG/ML IJ SOLN
INTRAMUSCULAR | Status: DC | PRN
  Administered 2017-08-04: 10 mg via INTRAVENOUS

## 2017-08-04 MED ORDER — ONDANSETRON HCL 4 MG/2ML IJ SOLN
INTRAMUSCULAR | Status: DC | PRN
  Administered 2017-08-04: 4 mg via INTRAVENOUS

## 2017-08-04 MED ORDER — PHENYLEPHRINE 40 MCG/ML (10ML) SYRINGE FOR IV PUSH (FOR BLOOD PRESSURE SUPPORT)
PREFILLED_SYRINGE | INTRAVENOUS | Status: DC | PRN
  Administered 2017-08-04 (×2): 120 ug via INTRAVENOUS

## 2017-08-04 MED ORDER — LIDOCAINE 2% (20 MG/ML) 5 ML SYRINGE
INTRAMUSCULAR | Status: AC
  Filled 2017-08-04: qty 5

## 2017-08-04 MED ORDER — LACTATED RINGERS IV SOLN
INTRAVENOUS | Status: DC
  Administered 2017-08-04 (×3): via INTRAVENOUS
  Filled 2017-08-04: qty 1000

## 2017-08-04 MED ORDER — MIDAZOLAM HCL 2 MG/2ML IJ SOLN
INTRAMUSCULAR | Status: AC
  Filled 2017-08-04: qty 2

## 2017-08-04 MED ORDER — KETOROLAC TROMETHAMINE 30 MG/ML IJ SOLN
INTRAMUSCULAR | Status: AC
  Filled 2017-08-04: qty 1

## 2017-08-04 MED ORDER — KETOROLAC TROMETHAMINE 30 MG/ML IJ SOLN
30.0000 mg | Freq: Once | INTRAMUSCULAR | Status: DC | PRN
  Filled 2017-08-04: qty 1

## 2017-08-04 MED ORDER — PROPOFOL 10 MG/ML IV BOLUS
INTRAVENOUS | Status: DC | PRN
  Administered 2017-08-04: 150 mg via INTRAVENOUS

## 2017-08-04 MED ORDER — FENTANYL CITRATE (PF) 100 MCG/2ML IJ SOLN
25.0000 ug | INTRAMUSCULAR | Status: DC | PRN
  Filled 2017-08-04: qty 1

## 2017-08-04 SURGICAL SUPPLY — 23 items
BIPOLAR CUTTING LOOP 21FR (ELECTRODE)
CANISTER SUCT 3000ML PPV (MISCELLANEOUS) ×4 IMPLANT
CATH ROBINSON RED A/P 16FR (CATHETERS) ×2 IMPLANT
DEVICE MYOSURE LITE (MISCELLANEOUS) ×2 IMPLANT
DEVICE MYOSURE REACH (MISCELLANEOUS) IMPLANT
DILATOR CANAL MILEX (MISCELLANEOUS) ×2 IMPLANT
FILTER ARTHROSCOPY CONVERTOR (FILTER) ×2 IMPLANT
GLOVE ECLIPSE 6.5 STRL STRAW (GLOVE) ×4 IMPLANT
GLOVE INDICATOR 7.0 STRL GRN (GLOVE) ×2 IMPLANT
GOWN STRL REUS W/ TWL LRG LVL3 (GOWN DISPOSABLE) ×1 IMPLANT
GOWN STRL REUS W/ TWL XL LVL3 (GOWN DISPOSABLE) ×1 IMPLANT
GOWN STRL REUS W/TWL LRG LVL3 (GOWN DISPOSABLE) ×1
GOWN STRL REUS W/TWL XL LVL3 (GOWN DISPOSABLE) ×1
IV NS IRRIG 3000ML ARTHROMATIC (IV SOLUTION) ×4 IMPLANT
KIT RM TURNOVER CYSTO AR (KITS) ×2 IMPLANT
LOOP CUTTING BIPOLAR 21FR (ELECTRODE) IMPLANT
PACK VAGINAL MINOR WOMEN LF (CUSTOM PROCEDURE TRAY) ×2 IMPLANT
PAD OB MATERNITY 4.3X12.25 (PERSONAL CARE ITEMS) ×2 IMPLANT
SEAL ROD LENS SCOPE MYOSURE (ABLATOR) ×2 IMPLANT
TOWEL OR 17X24 6PK STRL BLUE (TOWEL DISPOSABLE) ×4 IMPLANT
TUBING AQUILEX INFLOW (TUBING) ×2 IMPLANT
TUBING AQUILEX OUTFLOW (TUBING) ×2 IMPLANT
WATER STERILE IRR 500ML POUR (IV SOLUTION) ×2 IMPLANT

## 2017-08-04 NOTE — H&P (Signed)
[name] is an 67 y.o. female G2P1Ectopic1 here for hysteroscopy with polyp resection, D&C due to thickened endometrium.  Pt underwent ultrasound on 04/04/17 showign 2cm endoemtrium.  Pt is on Tamoxifen due to hx of breast cancer.  She has been diagnosed with scleroderma in the interval time between initial visit and how.  Is currently being followed at [facility].  Pulmonary clearance from [facility] pulmonologist has been completed.  Due to thickness of endometrium and Tamoxifen use, I do not feel there is any other alternative to this procedure.  Pt did have a negative endometrial biopsy in office on 04/04/17.  Procedure, risks and benefits have all been discussed.  Pt here and ready to proceed.  Pertinent Gynecological History: Menses: post-menopausal Bleeding: none Contraception: post menopausal status DES exposure: denies Blood transfusions: none Sexually transmitted diseases: no past history Last mammogram: normal Date: 5/18 Last pap: normal Date: 9/18 OB History: G2, P1   Menstrual History: No LMP recorded. Patient is postmenopausal.    Past Medical History:  Diagnosis Date  . 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 Depression   . Endometrial mass   . History of external beam radiation therapy 11-25-2011 to 01-08-2012   left breast 45 Gy at 1.8 per fraction x25 fractions, boost 16 Gy at 2 per fraction x8 fractions  . Hyperlipidemia   . Hypothyroidism   . Osteoarthritis   . Osteopenia   . Positive ANA (antinuclear antibody)   . Rash    arms and legs caused by skin scleroderma  . Raynaud's syndrome   . 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 Ruthann [facility] at [facility]   systemic sclerosis , diffuse/   rheumotologist:  dr Manuella Ghazi [facility]---  [place] 07-22-2017 (as of 07-25-2017 note not available to read)---  per pt has appt. w/ specialist at [facility]  . Skin thickening    hands, arms, legs  caused by scleroderma  . Systemic sclerosis [facility])     Past Surgical History:  Procedure Laterality Date  . BUNIONECTOMY  2000   Left  . ECTOPIC PREGNANCY SURGERY  1986-88   s/p unilateral salpingectomy  . PARTIAL MASTECTOMY WITH AXILLARY SENTINEL LYMPH NODE BIOPSY Left 11-04-2011   dr Margot Chimes  [facility]  . TRANSTHORACIC ECHOCARDIOGRAM  06-04-2017    [facility]   moderate LVH, ef>55%/  trivial AR and TR/ mild MR and PR/  trivial pericardial effusion  . WISDOM TOOTH EXTRACTION      Family History  Problem Relation Age of Onset  . Heart disease Mother   . Heart failure Mother   . Heart disease Father   . Ovarian cancer Maternal Aunt   . Heart disease Paternal Uncle        multiple  . Autoimmune disease Sister   . Arrhythmia Sister   . Anemia Sister   . CAD Brother     Social History:  reports that  has never smoked. she has never used smokeless tobacco. She reports that she drinks alcohol. She reports that she does not use drugs.  Allergies:  Allergies  Allergen Reactions  . Betadine [Povidone Iodine] Rash  .  Contrast Media [Iodinated Diagnostic Agents] Rash    "per pt recently had CT 08/ 2018 even through given pre-medication, IVP still caused rash"  . Epinephrine Other (See Comments)    Sever headaches  . Oxycodone Anxiety  . Nickel Rash and Other (See Comments)    Rash and blisters  . Other Rash and Other (See Comments)    Hospital gown  . Penicillins Rash and Other (See Comments)    Rash and blisters Has patient had a PCN reaction causing immediate rash, facial/tongue/throat swelling, SOB or lightheadedness with hypotension: Yes Has patient had a PCN reaction causing severe rash involving mucus membranes or  skin necrosis: No Has patient had a PCN reaction that required hospitalization: No Has patient had a PCN reaction occurring within the last 10 years: No If all of the above answers are "NO", then may proceed with Cephalosporin use.   . Sulfa Antibiotics Rash    Medications Prior to Admission  Medication Sig Dispense Refill Last Dose  . cetirizine HCl (ZYRTEC CHILDRENS ALLERGY) 5 MG/5ML SOLN Take 5 mg by mouth every evening.   5/63/8937  . folic acid (FOLVITE) 1 MG tablet Take 1 mg by mouth 2 (two) times daily.    08/02/2017  . furosemide (LASIX) 20 MG tablet Take 20-40 mg by mouth daily as needed for fluid.    08/03/2017 at Unknown time  . Magnesium 400 MG CAPS Take 400 mg by mouth at bedtime.    08/01/2017  . meloxicam (MOBIC) 15 MG tablet Take 15 mg by mouth every morning.    08/02/2017  . mycophenolate (CELLCEPT) 500 MG tablet Take 500 mg by mouth 2 (two) times daily. Takes 2 tablet in am and 1 tablet in pm   08/03/2017 at Unknown time  . SYNTHROID 100 MCG tablet Take 100 mcg by mouth daily before breakfast.    08/04/2017 at 0600  . tamoxifen (NOLVADEX) 20 MG tablet TAKE 1 TABLET BY MOUTH DAILY (Patient taking differently: TAKE 20 MG BY MOUTH DAILY---  takes in pm) 90 tablet 2 08/03/2017 at Unknown time  . Turmeric Curcumin 500 MG CAPS Take 500 mg by mouth 2 (two) times daily.    08/03/2017 at Unknown time    Review of Systems  Constitutional: Negative.   Respiratory: Negative.   Cardiovascular: Negative.   Gastrointestinal: Negative.   Genitourinary: Negative.     Blood pressure 131/74, pulse 79, temperature 98.1 F (36.7 C), temperature source Oral, resp. rate 16, height 5' 4.5" (1.638 m), weight 148 lb 6.4 oz (67.3 kg), SpO2 100 %. Physical Exam  Constitutional: She is oriented to person, place, and time. She appears well-developed and well-nourished.  Cardiovascular: Normal rate and regular rhythm.  Respiratory: Effort normal and breath sounds normal.  Neurological: She is alert and  oriented to person, place, and time.  Skin: Skin is warm and dry.  Psychiatric: She has a normal mood and affect.    No results found for this or any previous visit (from the past 24 hour(s)).  No results found.  Assessment/Plan: 67 yo G2P1Ectopic1 MWF with thickened endometrium, Tamoxifen use here for hysteroscopy with polyp resection, D&C.  All questions answered.  Megan Salon 08/04/2017, 9:09 AM

## 2017-08-04 NOTE — Transfer of Care (Signed)
Immediate Anesthesia Transfer of Care Note  Patient: [name]  Procedure(s) Performed: DILATATION & CURETTAGE/HYSTEROSCOPY WITH MYOSURE (N/A )  Patient Location: PACU  Anesthesia Type:General  Level of Consciousness: sedated  Airway & Oxygen Therapy: Patient Spontanous Breathing and Patient connected to face mask oxygen  Post-op Assessment: Report given to RN and Post -op Vital signs reviewed and stable  Post vital signs: Reviewed and stable  Last Vitals:  Vitals:   08/04/17 0756 08/04/17 1005  BP: 131/74   Pulse: 79 (P) 70  Resp: 16   Temp: 36.7 C (P) 36.7 C  SpO2: 100% (P) 100%    Last Pain:  Vitals:   08/04/17 0819  TempSrc:   PainSc: 6       Patients Stated Pain Goal: 7 (13/24/40 1027)  Complications: No apparent anesthesia complications

## 2017-08-04 NOTE — Op Note (Signed)
08/04/2017  10:23 AM  PATIENT:  [name]  67 y.o. female  PRE-OPERATIVE DIAGNOSIS:  thickened endometrium, endometrial mass   POST-OPERATIVE DIAGNOSIS:  thickened endometrium, endometrial mass, tamoxifen use  PROCEDURE:  Procedure(s): DILATATION & CURETTAGE/HYSTEROSCOPY WITH MYOSURE  SURGEON:  Megan Salon  ASSISTANTS: OR staff   ANESTHESIA:   general  ESTIMATED BLOOD LOSS: 10 mL  BLOOD ADMINISTERED:none   FLUIDS: 800cc LR  UOP: 75cc  SPECIMEN:  Endometrial polyp and curettings  DISPOSITION OF SPECIMEN:  PATHOLOGY  FINDINGS: endometrial polyp with irregular appearing base that was wide and cystic in appearance  DESCRIPTION OF OPERATION: Patient was taken to the operating room.  She is placed in the supine position. SCDs were on her lower extremities and functioning properly. General anesthesia with an LMA was administered without difficulty. Dr. Kalman Shan, anesthesia, oversaw case.  Legs were then placed in the Black in the low lithotomy position. The legs were lifted to the high lithotomy position and the Betadine prep was used on the inner thighs perineum and vagina x3. Patient was draped in a normal standard fashion. An in and out catheterization with a red rubber Foley catheter was performed. Approximately 75 cc of clear urine was noted. A bivalve speculum was placed the vagina. The anterior lip of the cervix was grasped with single-tooth tenaculum.  A paracervical block of 1% lidocaine, plain, 10 cc was used total. The cervix is dilated up to #21 Pratt dilators. The endometrial cavity sounded to 8.5 cm.   A Myosure diagnostic hysteroscope was obtained. Normal saline was used as a hysteroscopic fluid. The hysteroscope was advanced through the endocervical canal into the endometrial cavity. The tubal ostia could not initially be seen due to the irregular base of the polyp.  The Myosure lite was used to resect the polyp and then a #1 toothed curette was used to  curette the cavity until rough gritty texture was noted in all quadrants. With revisualization of the hysteroscope, there was no longer any abnormal findings.  At this point no other procedure was needed and this procedure was ended. The hysteroscope was removed. The fluid deficit was 125 cc NS. The tenaculum was removed from the anterior lip of the cervix. The speculum was removed from the vagina. The prep was cleansed of the patient's skin. The legs are positioned back in the supine position. Sponge, lap, needle, initially counts were correct x2. Patient was taken to recovery in stable condition.  COUNTS:  YES  PLAN OF CARE: Transfer to PACU

## 2017-08-04 NOTE — Anesthesia Procedure Notes (Signed)
Date/Time: 08/04/2017 9:58 AM Performed by: Cynda Familia, CRNA Oxygen Delivery Method: Simple face mask Placement Confirmation: positive ETCO2 and breath sounds checked- equal and bilateral Dental Injury: Teeth and Oropharynx as per pre-operative assessment

## 2017-08-04 NOTE — Anesthesia Procedure Notes (Signed)
Procedure Name: LMA Insertion Date/Time: 08/04/2017 9:32 AM Performed by: Cynda Familia, CRNA Pre-anesthesia Checklist: Patient identified, Emergency Drugs available, Suction available and Patient being monitored Patient Re-evaluated:Patient Re-evaluated prior to induction Oxygen Delivery Method: Circle System Utilized Preoxygenation: Pre-oxygenation with 100% oxygen Induction Type: IV induction Ventilation: Mask ventilation without difficulty LMA: LMA inserted LMA Size: 4.0 Number of attempts: 1 Placement Confirmation: positive ETCO2 Tube secured with: Tape Dental Injury: Teeth and Oropharynx as per pre-operative assessment  Comments: Smooth IV induction Rose  LMA AM CRNA atraumatic teeth and mouth as preop--- small chipped on front tooth unchanged after LMA placement--- bilat BS Rose

## 2017-08-04 NOTE — Anesthesia Postprocedure Evaluation (Signed)
Anesthesia Post Note  Patient: [name]  Procedure(s) Performed: DILATATION & CURETTAGE/HYSTEROSCOPY WITH MYOSURE (N/A )     Patient location during evaluation: PACU Anesthesia Type: General Level of consciousness: awake and alert Pain management: pain level controlled Vital Signs Assessment: post-procedure vital signs reviewed and stable Respiratory status: spontaneous breathing, nonlabored ventilation, respiratory function stable and patient connected to nasal cannula oxygen Cardiovascular status: blood pressure returned to baseline and stable Postop Assessment: no apparent nausea or vomiting Anesthetic complications: no    Last Vitals:  Vitals:   08/04/17 1020 08/04/17 1025  BP:    Pulse: 73 73  Resp: 11 11  Temp:    SpO2: 100% 99%    Last Pain:  Vitals:   08/04/17 1005  TempSrc:   PainSc: Asleep                 Rhapsody Wolven S

## 2017-08-04 NOTE — Discharge Instructions (Addendum)
Post-surgical Instructions, Outpatient Surgery  You may expect to feel dizzy, weak, and drowsy for as long as 24 hours after receiving the medicine that made you sleep (anesthetic). For the first 24 hours after your surgery:    Do not drive a car, ride a bicycle, participate in physical activities, or take public transportation until you are done taking narcotic pain medicines or as directed by Dr. Sabra Heck.   Do not drink alcohol or take tranquilizers.   Do not take medicine that has not been prescribed by your physicians.   Do not sign important papers or make important decisions while on narcotic pain medicines.   Have a responsible person with you.   PAIN MANAGEMENT  Vicodin 5/325mg .  For more severe pain, take one or two tablets every four to six hours as needed for pain control.  (Remember that narcotic pain medications increase your risk of constipation.  If this becomes a problem, you may take an over the counter stool softener like Colace 100mg  up to four times a day.)  DO'S AND DON'T'S  Do not take a tub bath for one week.  You may shower on the first day after your surgery  Do not do any heavy lifting for one to two weeks.  This increases the chance of bleeding.  Do move around as you feel able.  Stairs are fine.  You may begin to exercise again as you feel able.  Do not lift any weights for two weeks.  Do not put anything in the vagina for two weeks--no tampons, intercourse, or douching.    REGULAR MEDIATIONS/VITAMINS:  You may restart all of your regular medications as prescribed.  You may restart all of your vitamins as you normally take them.    PLEASE CALL OR SEEK MEDICAL CARE IF:  You have persistent nausea and vomiting.   You have trouble eating or drinking.   You have an oral temperature above 100.5.   You have constipation that is not helped by adjusting diet or increasing fluid intake. Pain medicines are a common cause of constipation.   You have heavy  vaginal bleeding  Post Anesthesia Home Care Instructions  Activity: Get plenty of rest for the remainder of the day. A responsible individual must stay with you for 24 hours following the procedure.  For the next 24 hours, DO NOT: -Drive a car -Paediatric nurse -Drink alcoholic beverages -Take any medication unless instructed by your physician -Make any legal decisions or sign important papers.  Meals: Start with liquid foods such as gelatin or soup. Progress to regular foods as tolerated. Avoid greasy, spicy, heavy foods. If nausea and/or vomiting occur, drink only clear liquids until the nausea and/or vomiting subsides. Call your physician if vomiting continues.  Special Instructions/Symptoms: Your throat may feel dry or sore from the anesthesia or the breathing tube placed in your throat during surgery. If this causes discomfort, gargle with warm salt water. The discomfort should disappear within 24 hours.  If you had a scopolamine patch placed behind your ear for the management of post- operative nausea and/or vomiting:  1. The medication in the patch is effective for 72 hours, after which it should be removed.  Wrap patch in a tissue and discard in the trash. Wash hands thoroughly with soap and water. 2. You may remove the patch earlier than 72 hours if you experience unpleasant side effects which may include dry mouth, dizziness or visual disturbances. 3. Avoid touching the patch. Wash your hands with  soap and water after contact with the patch.

## 2017-08-04 NOTE — Anesthesia Preprocedure Evaluation (Addendum)
Anesthesia Evaluation  Patient identified by MRN, date of birth, ID band Patient awake    Reviewed: Allergy & Precautions, NPO status , Patient's Chart, lab work & pertinent test results  Airway Mallampati: II  TM Distance: >3 FB Neck ROM: Full    Dental no notable dental hx. 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 Ruthann Cancer at [facility] systemic sclerosis , diffuse/   rheumotologist:  dr Manuella Ghazi at [facility]---  [place] 07-22-2017 (as of 07-25-2017 note not available to read)---  per pt has appt. w/ specialist at [facility]   B multifocal infiltrates    Pulmonary exam normal breath sounds clear to auscultation       Cardiovascular negative cardio ROS Normal cardiovascular exam Rhythm:Regular Rate:Normal     Neuro/Psych negative neurological ROS  negative psych ROS   GI/Hepatic negative GI ROS, Neg liver ROS,   Endo/Other  negative endocrine ROS  Renal/GU negative Renal ROS  negative genitourinary   Musculoskeletal negative musculoskeletal ROS (+)   Abdominal   Peds negative pediatric ROS (+)  Hematology negative hematology ROS (+)   Anesthesia Other Findings   Reproductive/Obstetrics negative OB ROS                            Anesthesia Physical Anesthesia Plan  ASA: III  Anesthesia Plan: General   Post-op Pain Management:    Induction: Intravenous  PONV Risk Score and Plan: 2 and Ondansetron, Dexamethasone and Treatment may vary due to age or medical condition  Airway Management Planned: LMA  Additional Equipment:   Intra-op Plan:   Post-operative Plan: Extubation in OR  Informed Consent: I have reviewed the patients History and Physical, chart, labs and discussed the procedure including the risks, benefits and alternatives for the proposed anesthesia with the patient or authorized representative who has  indicated his/her understanding and acceptance.   Dental advisory given  Plan Discussed with: CRNA and Surgeon  Anesthesia Plan Comments:         Anesthesia Quick Evaluation

## 2017-08-05 ENCOUNTER — Encounter (HOSPITAL_BASED_OUTPATIENT_CLINIC_OR_DEPARTMENT_OTHER): Payer: Self-pay | Admitting: Obstetrics & Gynecology

## 2017-08-06 DIAGNOSIS — M25642 Stiffness of left hand, not elsewhere classified: Secondary | ICD-10-CM | POA: Diagnosis not present

## 2017-08-06 DIAGNOSIS — M349 Systemic sclerosis, unspecified: Secondary | ICD-10-CM | POA: Diagnosis not present

## 2017-08-06 DIAGNOSIS — M79643 Pain in unspecified hand: Secondary | ICD-10-CM | POA: Diagnosis not present

## 2017-08-06 DIAGNOSIS — M79646 Pain in unspecified finger(s): Secondary | ICD-10-CM | POA: Diagnosis not present

## 2017-08-06 DIAGNOSIS — R29898 Other symptoms and signs involving the musculoskeletal system: Secondary | ICD-10-CM | POA: Diagnosis not present

## 2017-08-06 DIAGNOSIS — M25641 Stiffness of right hand, not elsewhere classified: Secondary | ICD-10-CM | POA: Diagnosis not present

## 2017-08-11 DIAGNOSIS — M7090 Unspecified soft tissue disorder related to use, overuse and pressure of unspecified site: Secondary | ICD-10-CM | POA: Diagnosis not present

## 2017-08-11 DIAGNOSIS — R0789 Other chest pain: Secondary | ICD-10-CM | POA: Diagnosis not present

## 2017-08-11 DIAGNOSIS — Z6824 Body mass index (BMI) 24.0-24.9, adult: Secondary | ICD-10-CM | POA: Diagnosis not present

## 2017-08-12 ENCOUNTER — Ambulatory Visit (HOSPITAL_COMMUNITY)
Admission: RE | Admit: 2017-08-12 | Discharge: 2017-08-12 | Disposition: A | Discharge status: Home / Self Care | Payer: Medicare Other | Source: Ambulatory Visit | Attending: Internal Medicine | Admitting: Internal Medicine

## 2017-08-12 ENCOUNTER — Other Ambulatory Visit (HOSPITAL_COMMUNITY): Payer: Self-pay | Admitting: Internal Medicine

## 2017-08-12 DIAGNOSIS — R079 Chest pain, unspecified: Secondary | ICD-10-CM | POA: Diagnosis not present

## 2017-08-12 DIAGNOSIS — R0789 Other chest pain: Secondary | ICD-10-CM

## 2017-08-12 DIAGNOSIS — M79643 Pain in unspecified hand: Secondary | ICD-10-CM | POA: Diagnosis not present

## 2017-08-12 DIAGNOSIS — R29898 Other symptoms and signs involving the musculoskeletal system: Secondary | ICD-10-CM | POA: Diagnosis not present

## 2017-08-12 DIAGNOSIS — M25641 Stiffness of right hand, not elsewhere classified: Secondary | ICD-10-CM | POA: Diagnosis not present

## 2017-08-12 DIAGNOSIS — M79646 Pain in unspecified finger(s): Secondary | ICD-10-CM | POA: Diagnosis not present

## 2017-08-12 DIAGNOSIS — M349 Systemic sclerosis, unspecified: Secondary | ICD-10-CM | POA: Diagnosis not present

## 2017-08-12 DIAGNOSIS — M25642 Stiffness of left hand, not elsewhere classified: Secondary | ICD-10-CM | POA: Diagnosis not present

## 2017-08-12 MED ORDER — TECHNETIUM TC 99M DIETHYLENETRIAME-PENTAACETIC ACID
31.0000 | Freq: Once | INTRAVENOUS | Status: AC | PRN
  Administered 2017-08-12: 31 via INTRAVENOUS

## 2017-08-12 MED ORDER — TECHNETIUM TO 99M ALBUMIN AGGREGATED
4.0000 | Freq: Once | INTRAVENOUS | Status: AC | PRN
  Administered 2017-08-12: 4 via INTRAVENOUS

## 2017-08-13 DIAGNOSIS — H25813 Combined forms of age-related cataract, bilateral: Secondary | ICD-10-CM | POA: Diagnosis not present

## 2017-08-13 DIAGNOSIS — H52223 Regular astigmatism, bilateral: Secondary | ICD-10-CM | POA: Diagnosis not present

## 2017-08-13 DIAGNOSIS — H524 Presbyopia: Secondary | ICD-10-CM | POA: Diagnosis not present

## 2017-08-13 DIAGNOSIS — H5203 Hypermetropia, bilateral: Secondary | ICD-10-CM | POA: Diagnosis not present

## 2017-08-14 DIAGNOSIS — R29898 Other symptoms and signs involving the musculoskeletal system: Secondary | ICD-10-CM | POA: Diagnosis not present

## 2017-08-14 DIAGNOSIS — M79643 Pain in unspecified hand: Secondary | ICD-10-CM | POA: Diagnosis not present

## 2017-08-14 DIAGNOSIS — M546 Pain in thoracic spine: Secondary | ICD-10-CM | POA: Diagnosis not present

## 2017-08-14 DIAGNOSIS — M349 Systemic sclerosis, unspecified: Secondary | ICD-10-CM | POA: Diagnosis not present

## 2017-08-14 DIAGNOSIS — M79646 Pain in unspecified finger(s): Secondary | ICD-10-CM | POA: Diagnosis not present

## 2017-08-14 DIAGNOSIS — M25642 Stiffness of left hand, not elsewhere classified: Secondary | ICD-10-CM | POA: Diagnosis not present

## 2017-08-14 DIAGNOSIS — M25641 Stiffness of right hand, not elsewhere classified: Secondary | ICD-10-CM | POA: Diagnosis not present

## 2017-08-14 DIAGNOSIS — M542 Cervicalgia: Secondary | ICD-10-CM | POA: Diagnosis not present

## 2017-08-14 DIAGNOSIS — M25512 Pain in left shoulder: Secondary | ICD-10-CM | POA: Diagnosis not present

## 2017-08-18 DIAGNOSIS — M25642 Stiffness of left hand, not elsewhere classified: Secondary | ICD-10-CM | POA: Diagnosis not present

## 2017-08-18 DIAGNOSIS — R29898 Other symptoms and signs involving the musculoskeletal system: Secondary | ICD-10-CM | POA: Diagnosis not present

## 2017-08-18 DIAGNOSIS — M542 Cervicalgia: Secondary | ICD-10-CM | POA: Diagnosis not present

## 2017-08-18 DIAGNOSIS — M79643 Pain in unspecified hand: Secondary | ICD-10-CM | POA: Diagnosis not present

## 2017-08-18 DIAGNOSIS — M25641 Stiffness of right hand, not elsewhere classified: Secondary | ICD-10-CM | POA: Diagnosis not present

## 2017-08-18 DIAGNOSIS — M79646 Pain in unspecified finger(s): Secondary | ICD-10-CM | POA: Diagnosis not present

## 2017-08-18 DIAGNOSIS — M349 Systemic sclerosis, unspecified: Secondary | ICD-10-CM | POA: Diagnosis not present

## 2017-08-19 ENCOUNTER — Other Ambulatory Visit: Payer: Self-pay

## 2017-08-19 ENCOUNTER — Ambulatory Visit (INDEPENDENT_AMBULATORY_CARE_PROVIDER_SITE_OTHER): Payer: Medicare Other | Admitting: Obstetrics & Gynecology

## 2017-08-19 ENCOUNTER — Encounter: Payer: Self-pay | Admitting: Obstetrics & Gynecology

## 2017-08-19 VITALS — BP 126/54 | HR 64 | Resp 16 | Wt 147.2 lb

## 2017-08-19 DIAGNOSIS — N840 Polyp of corpus uteri: Secondary | ICD-10-CM | POA: Diagnosis not present

## 2017-08-19 NOTE — Progress Notes (Addendum)
Post Operative Visit  Procedure: DILATATION & CURETTAGE/HYSTEROSCOPY WITH MYOSURE Days Post-op: 15 days   Subjective: Doing well.  Has spotting for a few days post op but this has stopped.  Denies vaginal bleeding or pelvic pain.  Really didn't take anything for post op pain.  Reviewed pathology with pt.  Aware copy has been sent to Dr. Brigitte Pulse, PCP.  Questions answered.  Objective: BP (!) 126/54 (BP Location: Right Arm, Patient Position: Sitting, Cuff Size: Normal)   Pulse 64   Resp 16   Wt 147 lb 4 oz (66.8 kg)   BMI 24.89 kg/m   EXAM General: alert and cooperative Resp: clear to auscultation bilaterally Cardio: regular rate and rhythm, S1, S2 normal, no murmur, click, rub or gallop GI: soft, non-tender; bowel sounds normal; no masses,  no organomegaly Extremities: extremities normal, atraumatic, no cyanosis or edema Vaginal Bleeding: none  GYN:  NAEFG, vaginal without lesions, cervix closed, no bleeding or discharge noted, normal uterus, no CMT  Assessment: s/p hysteroscopy with polyp resection in pt with Tamoxifen use.  Pathology showed endometrial polyp Dyspareunia, vaginal atrophic changes  Plan: Pt knows to call with any future bleeding.  She will be seen in 1 year. Vit E vaginal suppositories 200u/ml faxed to [place].  #36/3RF.  Pt knows to call if this doesn't seem to help.

## 2017-08-20 DIAGNOSIS — M542 Cervicalgia: Secondary | ICD-10-CM | POA: Diagnosis not present

## 2017-08-21 DIAGNOSIS — M25642 Stiffness of left hand, not elsewhere classified: Secondary | ICD-10-CM | POA: Diagnosis not present

## 2017-08-21 DIAGNOSIS — M79646 Pain in unspecified finger(s): Secondary | ICD-10-CM | POA: Diagnosis not present

## 2017-08-21 DIAGNOSIS — R29898 Other symptoms and signs involving the musculoskeletal system: Secondary | ICD-10-CM | POA: Diagnosis not present

## 2017-08-21 DIAGNOSIS — M25641 Stiffness of right hand, not elsewhere classified: Secondary | ICD-10-CM | POA: Diagnosis not present

## 2017-08-21 DIAGNOSIS — M349 Systemic sclerosis, unspecified: Secondary | ICD-10-CM | POA: Diagnosis not present

## 2017-08-21 DIAGNOSIS — M79643 Pain in unspecified hand: Secondary | ICD-10-CM | POA: Diagnosis not present

## 2017-08-22 DIAGNOSIS — M542 Cervicalgia: Secondary | ICD-10-CM | POA: Diagnosis not present

## 2017-08-25 ENCOUNTER — Telehealth: Payer: Self-pay | Admitting: Obstetrics & Gynecology

## 2017-08-25 MED ORDER — NONFORMULARY OR COMPOUNDED ITEM: 3 refills | Status: DC

## 2017-08-25 NOTE — Telephone Encounter (Signed)
Routing to [place] for review and advise of compounded medication.

## 2017-08-25 NOTE — Telephone Encounter (Signed)
Left detailed message at number provided  [phone], stating that RX has been sent to [facility]. Advised if she does not receive a call from them in 2 days to contact the pharmacy at [phone].

## 2017-08-25 NOTE — Telephone Encounter (Signed)
Prescription for vitamin e vaginal suppositories, #36, 3RF,  faxed to [place]. Fax #: [phone].

## 2017-08-25 NOTE — Telephone Encounter (Signed)
Rx rewritten and faxed again to [facility].  If no phone call from that pharmacy in two days, please give her the number there to call.  Thanks.

## 2017-08-25 NOTE — Telephone Encounter (Signed)
Patient called stating she has not heard from the compounding pharmacy yet about her new medication for suppositories.

## 2017-08-26 DIAGNOSIS — M349 Systemic sclerosis, unspecified: Secondary | ICD-10-CM | POA: Diagnosis not present

## 2017-08-26 DIAGNOSIS — E038 Other specified hypothyroidism: Secondary | ICD-10-CM | POA: Diagnosis not present

## 2017-08-26 DIAGNOSIS — M79646 Pain in unspecified finger(s): Secondary | ICD-10-CM | POA: Diagnosis not present

## 2017-08-26 DIAGNOSIS — M25642 Stiffness of left hand, not elsewhere classified: Secondary | ICD-10-CM | POA: Diagnosis not present

## 2017-08-26 DIAGNOSIS — M25641 Stiffness of right hand, not elsewhere classified: Secondary | ICD-10-CM | POA: Diagnosis not present

## 2017-08-26 DIAGNOSIS — M79643 Pain in unspecified hand: Secondary | ICD-10-CM | POA: Diagnosis not present

## 2017-08-26 DIAGNOSIS — R29898 Other symptoms and signs involving the musculoskeletal system: Secondary | ICD-10-CM | POA: Diagnosis not present

## 2017-08-28 DIAGNOSIS — M25641 Stiffness of right hand, not elsewhere classified: Secondary | ICD-10-CM | POA: Diagnosis not present

## 2017-08-28 DIAGNOSIS — R29898 Other symptoms and signs involving the musculoskeletal system: Secondary | ICD-10-CM | POA: Diagnosis not present

## 2017-08-28 DIAGNOSIS — M25642 Stiffness of left hand, not elsewhere classified: Secondary | ICD-10-CM | POA: Diagnosis not present

## 2017-09-02 DIAGNOSIS — R29898 Other symptoms and signs involving the musculoskeletal system: Secondary | ICD-10-CM | POA: Diagnosis not present

## 2017-09-02 DIAGNOSIS — M25641 Stiffness of right hand, not elsewhere classified: Secondary | ICD-10-CM | POA: Diagnosis not present

## 2017-09-02 DIAGNOSIS — M349 Systemic sclerosis, unspecified: Secondary | ICD-10-CM | POA: Diagnosis not present

## 2017-09-02 DIAGNOSIS — M79643 Pain in unspecified hand: Secondary | ICD-10-CM | POA: Diagnosis not present

## 2017-09-02 DIAGNOSIS — M79646 Pain in unspecified finger(s): Secondary | ICD-10-CM | POA: Diagnosis not present

## 2017-09-02 DIAGNOSIS — M25642 Stiffness of left hand, not elsewhere classified: Secondary | ICD-10-CM | POA: Diagnosis not present

## 2017-09-04 ENCOUNTER — Other Ambulatory Visit: Payer: Self-pay

## 2017-09-04 ENCOUNTER — Ambulatory Visit (INDEPENDENT_AMBULATORY_CARE_PROVIDER_SITE_OTHER): Payer: Medicare Other | Admitting: Vascular Surgery

## 2017-09-04 ENCOUNTER — Ambulatory Visit (HOSPITAL_COMMUNITY)
Admission: RE | Admit: 2017-09-04 | Discharge: 2017-09-04 | Disposition: A | Discharge status: Home / Self Care | Payer: Medicare Other | Source: Ambulatory Visit | Attending: Vascular Surgery | Admitting: Vascular Surgery

## 2017-09-04 ENCOUNTER — Encounter: Payer: Self-pay | Admitting: Vascular Surgery

## 2017-09-04 VITALS — BP 116/70 | HR 79 | Temp 97.4°F | Resp 16 | Ht 64.5 in | Wt 143.0 lb

## 2017-09-04 DIAGNOSIS — I872 Venous insufficiency (chronic) (peripheral): Secondary | ICD-10-CM | POA: Diagnosis not present

## 2017-09-04 DIAGNOSIS — M25642 Stiffness of left hand, not elsewhere classified: Secondary | ICD-10-CM | POA: Diagnosis not present

## 2017-09-04 DIAGNOSIS — M79643 Pain in unspecified hand: Secondary | ICD-10-CM | POA: Diagnosis not present

## 2017-09-04 DIAGNOSIS — M79646 Pain in unspecified finger(s): Secondary | ICD-10-CM | POA: Diagnosis not present

## 2017-09-04 DIAGNOSIS — R29898 Other symptoms and signs involving the musculoskeletal system: Secondary | ICD-10-CM | POA: Diagnosis not present

## 2017-09-04 DIAGNOSIS — M25641 Stiffness of right hand, not elsewhere classified: Secondary | ICD-10-CM | POA: Diagnosis not present

## 2017-09-04 DIAGNOSIS — M349 Systemic sclerosis, unspecified: Secondary | ICD-10-CM | POA: Diagnosis not present

## 2017-09-04 NOTE — Progress Notes (Signed)
Referring Physician: Dr Brigitte Pulse  Patient name: [name] MRN: [id number] DOB: 02-02-1951 Sex: female  REASON FOR CONSULT: Leg swelling  HPI: [name] is a 67 y.o. female who complains of bilateral lower extremity leg swelling which is been present for about 6 months.  She has been wearing lightweight compression with some improvement of symptoms.  She has also been placed on Lasix 40 mg once a day.  She denies any prior history of DVT.  She denies any family history of varicose veins.  She states both legs are fairly equally affected.  She has been recently diagnosed with scleroderma.  She also has Raynaud's.  Other medical problems include history of breast cancer hyperlipidemia hypothyroidism all of which are currently controlled.  She has follow-up scheduled in the near future at [facility] regarding her scleroderma.  She denies any ulcerations on her legs or feet.  She has had multiple prior echocardiograms which were normal.  Past Medical History:  Diagnosis Date  . 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 Depression   . Endometrial mass   . History of external beam radiation therapy 11-25-2011 to 01-08-2012   left breast 45 Gy at 1.8 per fraction x25 fractions, boost 16 Gy at 2 per fraction x8 fractions  . Hyperlipidemia   . Hypothyroidism   . Osteoarthritis   . Osteopenia   . Positive ANA (antinuclear antibody)   . Rash    arms and legs caused by skin scleroderma  . Raynaud's syndrome   . 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 Ruthann Cancer at [facility]   systemic sclerosis , diffuse/    rheumotologist:  dr Manuella Ghazi at [facility]---  [place] 07-22-2017 (as of 07-25-2017 note not available to read)---  per pt has appt. w/ specialist at [facility]  . Skin thickening    hands, arms, legs  caused by scleroderma  . Systemic sclerosis [facility])    Past Surgical History:  Procedure Laterality Date  . BUNIONECTOMY  2000   Left  . DILATATION & CURETTAGE/HYSTEROSCOPY WITH MYOSURE N/A 08/04/2017   Procedure: DILATATION & CURETTAGE/HYSTEROSCOPY WITH MYOSURE;  Surgeon: Megan Salon, MD;  Location: [facility];  Service: Gynecology;  Laterality: N/A;  . ECTOPIC PREGNANCY SURGERY  1986-88   s/p unilateral salpingectomy  . PARTIAL MASTECTOMY WITH AXILLARY SENTINEL LYMPH NODE BIOPSY Left 11-04-2011   dr Margot Chimes  [facility]  . TRANSTHORACIC ECHOCARDIOGRAM  06-04-2017    [facility]   moderate LVH, ef>55%/  trivial AR and TR/ mild MR and PR/  trivial pericardial effusion  . WISDOM TOOTH EXTRACTION      Family History  Problem Relation Age of Onset  . Heart disease Mother   . Heart failure Mother   . Heart disease Father   . Ovarian cancer Maternal Aunt   . Heart disease Paternal Uncle        multiple  . Autoimmune disease Sister   . Arrhythmia Sister   . Anemia Sister   . CAD Brother     SOCIAL HISTORY: Social History   Socioeconomic History  . Marital status: Married  Spouse name: Not on file  . Number of children: 1  . Years of education: 75  . Highest education level: Not on file  Social Needs  . Financial resource strain: Not on file  . Food insecurity - worry: Not on file  . Food insecurity - inability: Not on file  . Transportation needs - medical: Not on file  . Transportation needs - non-medical: Not on file  Occupational History  . 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 Smoking status: Never Smoker  . Smokeless tobacco: Never Used  Substance and Sexual Activity  . Alcohol use: Yes    Comment: Drinks one mixed drink nightly  . Drug use: No  . Sexual activity: Yes     Birth control/protection: Post-menopausal  Other Topics Concern  . Not on file  Social History Narrative   Lives with husband in a 2 story home.  Has 1 daughter.     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     Education: college.     Allergies  Allergen Reactions  . Betadine [Povidone Iodine] Rash  . Contrast Media [Iodinated Diagnostic Agents] Rash    "per pt recently had CT 08/ 2018 even through given pre-medication, IVP still caused rash"  . Epinephrine Other (See Comments)    Sever headaches  . Oxycodone Anxiety  . Nickel Rash and Other (See Comments)    Rash and blisters  . Other Rash and Other (See Comments)    Hospital gown  . Penicillins Rash and Other (See Comments)    Rash and blisters Has patient had a PCN reaction causing immediate rash, facial/tongue/throat swelling, SOB or lightheadedness with hypotension: Yes Has patient had a PCN reaction causing severe rash involving mucus membranes or skin necrosis: No Has patient had a PCN reaction that required hospitalization: No Has patient had a PCN reaction occurring within the last 10 years: No If all of the above answers are "NO", then may proceed with Cephalosporin use.   . Sulfa Antibiotics Rash    Current Outpatient Medications  Medication Sig Dispense Refill  . folic acid (FOLVITE) 1 MG tablet Take 1 mg by mouth 2 (two) times daily.     . furosemide (LASIX) 20 MG tablet Take 20-40 mg by mouth daily as needed for fluid.     [name] MAGNESIUM PO Take by mouth.    . mometasone (NASONEX) 50 MCG/ACT nasal spray 2 sprays by Each Nare route once daily.    . mycophenolate (CELLCEPT) 500 MG tablet Take 500 mg by mouth 2 (two) times daily. Takes 2 tablet in am and 1 tablet in pm    . NONFORMULARY OR COMPOUNDED ITEM Vit E vaginal suppositories, 200U/ml.  One PV three times weekly. 36 each 3  . SYNTHROID 100 MCG tablet Take 100 mcg by mouth daily before breakfast.     . tamoxifen (NOLVADEX) 20 MG tablet TAKE 1 TABLET BY MOUTH  DAILY (Patient taking differently: TAKE 20 MG BY MOUTH DAILY---  takes in pm) 90 tablet 2  . Turmeric Curcumin 500 MG CAPS Take 500 mg by mouth 2 (two) times daily.      No current facility-administered medications for this visit.     ROS:   General:  No weight loss, Fever, chills  HEENT: No recent headaches, no nasal bleeding, no visual changes, no sore throat  Neurologic: No dizziness, blackouts, seizures. No recent symptoms of stroke or mini- stroke. No recent episodes of slurred speech, or temporary blindness.  Cardiac: No  recent episodes of chest pain/pressure, no shortness of breath at rest.  No shortness of breath with exertion.  Denies history of atrial fibrillation or irregular heartbeat  Vascular: No history of rest pain in feet.  No history of claudication.  No history of non-healing ulcer, No history of DVT   Pulmonary: No home oxygen, no productive cough, no hemoptysis,  No asthma or wheezing  Musculoskeletal:  '[ ]'  Arthritis, '[ ]'  Low back pain,  '[ ]'  Joint pain  Hematologic:No history of hypercoagulable state.  No history of easy bleeding.  No history of anemia  Gastrointestinal: No hematochezia or melena,  No gastroesophageal reflux, no trouble swallowing  Urinary: '[ ]'  chronic Kidney disease, '[ ]'  on HD - '[ ]'  MWF or '[ ]'  TTHS, '[ ]'  Burning with urination, '[ ]'  Frequent urination, '[ ]'  Difficulty urinating;   Skin: No rashes  Psychological: No history of anxiety,  No history of depression   Physical Examination  Vitals:   09/04/17 1359  BP: 116/70  Pulse: 79  Resp: 16  Temp: (!) 97.4 F (36.3 C)  TempSrc: Oral  SpO2: 99%  Weight: 143 lb (64.9 kg)  Height: 5' 4.5" (1.638 m)    Body mass index is 24.17 kg/m.  General:  Alert and oriented, no acute distress HEENT: Normal Neck: No bruit or JVD Pulmonary: Clear to auscultation bilaterally Cardiac: Regular Rate and Rhythm without murmur Abdomen: Soft, non-tender, non-distended Skin: No rash, few scattered  reticular type varicosities along the left lateral leg and right medial thigh, no ulceration Extremity Pulses:  2+ radial, brachial, femoral, dorsalis pedis, posterior tibial pulses bilaterally Musculoskeletal: No deformity trace lower extremity edema bilaterally  Neurologic: Upper and lower extremity motor 5/5 and symmetric  DATA:  Patient had a venous reflux exam today.  This showed no evidence of DVT.  She did have evidence of deep vein reflux in the right leg.  She had no superficial venous reflux bilaterally.  Greater saphenous vein diameter was 2-1/2-3 mm.   ASSESSMENT: Bilateral lower extremity swelling.  This most likely is a component of deep vein reflux.  She does not have significant superficial venous reflux to consider laser ablation.  She has normal arterial circulation.   PLAN: Mainstay of therapy is going to be continued compression stockings.  I did discuss with her that she could increase these to 20-30 mmHg if it is not controlling her symptoms.  Also emphasized to her to elevate her legs as much as possible at the end of the day.  She will follow-up on an as-needed basis.  Ruta Hinds, MD [facility] Office: [phone] Pager: [phone]

## 2017-09-05 DIAGNOSIS — M349 Systemic sclerosis, unspecified: Secondary | ICD-10-CM | POA: Diagnosis not present

## 2017-09-09 DIAGNOSIS — R9389 Abnormal findings on diagnostic imaging of other specified body structures: Secondary | ICD-10-CM | POA: Diagnosis not present

## 2017-09-09 DIAGNOSIS — Z79899 Other long term (current) drug therapy: Secondary | ICD-10-CM | POA: Diagnosis not present

## 2017-09-09 DIAGNOSIS — I10 Essential (primary) hypertension: Secondary | ICD-10-CM | POA: Insufficient documentation

## 2017-09-09 DIAGNOSIS — R609 Edema, unspecified: Secondary | ICD-10-CM | POA: Diagnosis not present

## 2017-09-09 DIAGNOSIS — M349 Systemic sclerosis, unspecified: Secondary | ICD-10-CM | POA: Diagnosis not present

## 2017-09-09 DIAGNOSIS — I7300 Raynaud's syndrome without gangrene: Secondary | ICD-10-CM | POA: Diagnosis not present

## 2017-09-12 DIAGNOSIS — M79643 Pain in unspecified hand: Secondary | ICD-10-CM | POA: Diagnosis not present

## 2017-09-12 DIAGNOSIS — M25641 Stiffness of right hand, not elsewhere classified: Secondary | ICD-10-CM | POA: Diagnosis not present

## 2017-09-12 DIAGNOSIS — M25642 Stiffness of left hand, not elsewhere classified: Secondary | ICD-10-CM | POA: Diagnosis not present

## 2017-09-12 DIAGNOSIS — M79646 Pain in unspecified finger(s): Secondary | ICD-10-CM | POA: Diagnosis not present

## 2017-09-12 DIAGNOSIS — R29898 Other symptoms and signs involving the musculoskeletal system: Secondary | ICD-10-CM | POA: Diagnosis not present

## 2017-09-12 DIAGNOSIS — M349 Systemic sclerosis, unspecified: Secondary | ICD-10-CM | POA: Diagnosis not present

## 2017-09-16 DIAGNOSIS — M25642 Stiffness of left hand, not elsewhere classified: Secondary | ICD-10-CM | POA: Diagnosis not present

## 2017-09-16 DIAGNOSIS — M25641 Stiffness of right hand, not elsewhere classified: Secondary | ICD-10-CM | POA: Diagnosis not present

## 2017-09-16 DIAGNOSIS — M349 Systemic sclerosis, unspecified: Secondary | ICD-10-CM | POA: Diagnosis not present

## 2017-09-16 DIAGNOSIS — M79646 Pain in unspecified finger(s): Secondary | ICD-10-CM | POA: Diagnosis not present

## 2017-09-16 DIAGNOSIS — R29898 Other symptoms and signs involving the musculoskeletal system: Secondary | ICD-10-CM | POA: Diagnosis not present

## 2017-09-16 DIAGNOSIS — M79643 Pain in unspecified hand: Secondary | ICD-10-CM | POA: Diagnosis not present

## 2017-09-18 DIAGNOSIS — M349 Systemic sclerosis, unspecified: Secondary | ICD-10-CM | POA: Diagnosis not present

## 2017-09-18 DIAGNOSIS — M79646 Pain in unspecified finger(s): Secondary | ICD-10-CM | POA: Diagnosis not present

## 2017-09-18 DIAGNOSIS — M25642 Stiffness of left hand, not elsewhere classified: Secondary | ICD-10-CM | POA: Diagnosis not present

## 2017-09-18 DIAGNOSIS — R29898 Other symptoms and signs involving the musculoskeletal system: Secondary | ICD-10-CM | POA: Diagnosis not present

## 2017-09-18 DIAGNOSIS — M79643 Pain in unspecified hand: Secondary | ICD-10-CM | POA: Diagnosis not present

## 2017-09-18 DIAGNOSIS — M25641 Stiffness of right hand, not elsewhere classified: Secondary | ICD-10-CM | POA: Diagnosis not present

## 2017-09-23 DIAGNOSIS — R29898 Other symptoms and signs involving the musculoskeletal system: Secondary | ICD-10-CM | POA: Diagnosis not present

## 2017-09-23 DIAGNOSIS — M79643 Pain in unspecified hand: Secondary | ICD-10-CM | POA: Diagnosis not present

## 2017-09-23 DIAGNOSIS — M25642 Stiffness of left hand, not elsewhere classified: Secondary | ICD-10-CM | POA: Diagnosis not present

## 2017-09-23 DIAGNOSIS — M25641 Stiffness of right hand, not elsewhere classified: Secondary | ICD-10-CM | POA: Diagnosis not present

## 2017-09-23 DIAGNOSIS — M79646 Pain in unspecified finger(s): Secondary | ICD-10-CM | POA: Diagnosis not present

## 2017-09-23 DIAGNOSIS — M349 Systemic sclerosis, unspecified: Secondary | ICD-10-CM | POA: Diagnosis not present

## 2017-09-25 DIAGNOSIS — M25641 Stiffness of right hand, not elsewhere classified: Secondary | ICD-10-CM | POA: Diagnosis not present

## 2017-09-25 DIAGNOSIS — R29898 Other symptoms and signs involving the musculoskeletal system: Secondary | ICD-10-CM | POA: Diagnosis not present

## 2017-09-25 DIAGNOSIS — M79646 Pain in unspecified finger(s): Secondary | ICD-10-CM | POA: Diagnosis not present

## 2017-09-25 DIAGNOSIS — M25642 Stiffness of left hand, not elsewhere classified: Secondary | ICD-10-CM | POA: Diagnosis not present

## 2017-09-25 DIAGNOSIS — M79643 Pain in unspecified hand: Secondary | ICD-10-CM | POA: Diagnosis not present

## 2017-09-25 DIAGNOSIS — M349 Systemic sclerosis, unspecified: Secondary | ICD-10-CM | POA: Diagnosis not present

## 2017-10-01 DIAGNOSIS — M79646 Pain in unspecified finger(s): Secondary | ICD-10-CM | POA: Diagnosis not present

## 2017-10-01 DIAGNOSIS — R29898 Other symptoms and signs involving the musculoskeletal system: Secondary | ICD-10-CM | POA: Diagnosis not present

## 2017-10-01 DIAGNOSIS — M25641 Stiffness of right hand, not elsewhere classified: Secondary | ICD-10-CM | POA: Diagnosis not present

## 2017-10-01 DIAGNOSIS — M25642 Stiffness of left hand, not elsewhere classified: Secondary | ICD-10-CM | POA: Diagnosis not present

## 2017-10-01 DIAGNOSIS — M79643 Pain in unspecified hand: Secondary | ICD-10-CM | POA: Diagnosis not present

## 2017-10-01 DIAGNOSIS — M349 Systemic sclerosis, unspecified: Secondary | ICD-10-CM | POA: Diagnosis not present

## 2017-10-04 NOTE — Progress Notes (Signed)
ID: [name]   DOB: 1950/08/14  MR#: [id number]  CSN#:[id number]   PCP: Marton Redwood, MD Patient Care Team: Marton Redwood, MD as PCP - General (Internal Medicine) [name], Virgie Dad, MD (Hematology and Oncology) Alda Berthold, DO as Consulting Physician (Neurology) Rod Holler, MD Lucy Antigua, MD as Referring Physician (Rheumatology) Charlie Pitter, MD as Referring Physician (Pulmonary Disease) Megan Salon, MD as Consulting Physician (Gynecology) Artis Delay, MD as Referring Physician (Internal Medicine) Jarome Matin, MD as Consulting Physician (Dermatology)  CHIEF COMPLAINT: Estrogen receptor positive breast cancer  CURRENT TREATMENT: Tamoxifen  INTERVAL HISTORY: [name] returns today for follow-up and treatment of her estrogen receptor positive breast cancer. She is doing well overall. She continues on tamoxifen, with good tolerance. She denies hot flashes or increase in vaginal wetness. .   Since her last visit to the office, she had a D&C and hysteroscopy and polyp resection completed on 08/04/2017 with pathology ([id number]) showing: Endometrium, curettage, and polyp with inactive endometrium and benign endometrial polyp. No hyperplasia or malignancy.   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.  She had an esophagogram on 07/18/2017 showing no stricture and only mild motility disorder.  She required pulmonary clearance from [facility] before she could have her hysteroscopy, then on August 12, 2017 because of chest pain with no obvious injury she had a chest x-ray which showed no acute abnormality.  She had a ventilation perfusion study completed on 08/12/2017 with normal ventilation perfusion study. No evidence of a pulmonary embolism.    REVIEW OF SYSTEMS:  [name] reports that she had her Cellcept medication increased to 1,500 mg BID. She was placed on a medication for her itching. She notes that she is doing better. She  is now on her 10th "good day" in a row. She reports that the itching is worsened at night and makes her want to "come out of her skin." Dr. Manuella Ghazi. She will be going to Dr. Jarome Matin at [facility] today. She is still being seen at [facility] and has a follow up in April, she will also be evaluated tomorrow by the pulmonologist at [facility], which Dr. Manuella Ghazi will take to a research team to determine what is in the lungs and the cause of her prior symptoms. Per patient, she was informed that Dr. Manuella Ghazi stated that MRI's would be more relevant to her left breast versus mammograms. She didn't have anything completed for her Raynaud's disease. She has been given neoprene hand gloves prescribed by the hand specialist and was recommended that she continue with hand therapy and physical therapy. She has been offered the opportunity to be apart of several studies at [facility] and she was taken on as a patient. She denies unusual headaches, visual changes, nausea, vomiting, or dizziness. There has been no unusual cough, phlegm production, or pleurisy. This been no change in bowel or bladder habits. She denies unexplained fatigue or unexplained weight loss, bleeding, rash, or fever. A detailed review of systems was otherwise stable.      BREAST CANCER HISTORY: From the original intake note:  The patient had routine screening mammography at [facility] 08/27/2011, suggesting a potential abnormality in the left breast. Additional views 08/29/2011 confirmed an irregular mass with architectural or distortion measuring up to 4 cm in the left breast, without associated microcalcifications. By ultrasound this was irregular, and showed posterior shadowing. It measured 3.6 cm sonographically. There were no suspicious lymph nodes noted in the  left axilla.  The same day the patient underwent ultrasound-guided biopsy of the left breast mass, showing ([id number]) and invasive ductal carcinoma, grade 1, which was 100% estrogen receptor  and 97% progesterone receptor positive. The proliferation marker was 20%. There was no HER-2 amplification with a ratio by CISH of 1.09.  The patient had bilateral breast MRI 09/03/2011 showing a solitary 3.8 cm left breast lesion, and on 11/04/2011 underwent left lumpectomy and sentinel lymph node sampling with results and subsequent history as detailed below.   PAST MEDICAL HISTORY: Past Medical History:  Diagnosis Date  . 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 Depression   . Endometrial mass   . History of external beam radiation therapy 11-25-2011 to 01-08-2012   left breast 45 Gy at 1.8 per fraction x25 fractions, boost 16 Gy at 2 per fraction x8 fractions  . Hyperlipidemia   . Hypothyroidism   . Osteoarthritis   . Osteopenia   . Positive ANA (antinuclear antibody)   . Rash    arms and legs caused by skin scleroderma  . Raynaud's syndrome   . 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 Ruthann Cancer at [facility]   systemic sclerosis , diffuse/   rheumotologist:  dr Manuella Ghazi at [facility]---  [place] 07-22-2017 (as of 07-25-2017 note not available to read)---  per pt has appt. w/ specialist at [facility]  . Skin thickening    hands, arms, legs  caused by scleroderma  . 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 BUNIONECTOMY  2000   Left  . DILATATION & CURETTAGE/HYSTEROSCOPY WITH MYOSURE N/A 08/04/2017   Procedure: DILATATION & CURETTAGE/HYSTEROSCOPY WITH MYOSURE;  Surgeon: Megan Salon, MD;  Location: [facility];  Service: Gynecology;  Laterality: N/A;   . ECTOPIC PREGNANCY SURGERY  1986-88   s/p unilateral salpingectomy  . PARTIAL MASTECTOMY WITH AXILLARY SENTINEL LYMPH NODE BIOPSY Left 11-04-2011   dr Margot Chimes  [facility]  . TRANSTHORACIC ECHOCARDIOGRAM  06-04-2017    [facility]   moderate LVH, ef>55%/  trivial AR and TR/ mild MR and PR/  trivial pericardial effusion  . WISDOM TOOTH EXTRACTION      FAMILY HISTORY Family History  Problem Relation Age of Onset  . Heart disease Mother   . Heart failure Mother   . Heart disease Father   . Ovarian cancer Maternal Aunt   . Heart disease Paternal Uncle        multiple  . Autoimmune disease Sister   . Arrhythmia Sister   . Anemia Sister   . CAD Brother    the patient's father died at the age of 45 from myocardial infarction. The patient's mother died from congestive heart failure at the age of 70. The patient has one brother age 66 with prostate cancer diagnosed at age 64. The patient's sister is 66 as of 2013. There is no breast or ovarian cancer in the family  GYNECOLOGIC HISTORY:  (Reviewed 12/27/2013) She had menarche at around age 75. She is GX P1, first pregnancy to term age 47. She stopped having periods about  2003. She did not use hormone replacement  SOCIAL HISTORY:  (Updated August 2018). 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 Her husband Rush Landmark  has had prostate cancer, status post radiation treatments in [place] about 5 years ago. Her daughter Lanelle Bal lives in [place] but is moving to [place] and is currently residing with the patient with her husband and 2 children while the new house is getting ready. 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 The patient attends the [employer].   ADVANCED DIRECTIVES: in place  HEALTH MAINTENANCE: (Updated 12/27/2013) Social History   Tobacco Use  . Smoking status: Never Smoker  . Smokeless tobacco:  Never Used  Substance Use Topics  . Alcohol use: Yes    Comment: Drinks one mixed drink nightly  . Drug use: No     Colonoscopy: repeat due 2015  PAP: UTD ([facility])  Bone density: June 2013, osteopenia with T score -1.4  Lipid panel: June 2014  Allergies  Allergen Reactions  . Betadine [Povidone Iodine] Rash  . Contrast Media [Iodinated Diagnostic Agents] Rash    "per pt recently had CT 08/ 2018 even through given pre-medication, IVP still caused rash"  . Epinephrine Other (See Comments)    Sever headaches  . Oxycodone Anxiety  . Nickel Rash and Other (See Comments)    Rash and blisters  . Other Rash and Other (See Comments)    Hospital gown  . Penicillins Rash and Other (See Comments)    Rash and blisters Has patient had a PCN reaction causing immediate rash, facial/tongue/throat swelling, SOB or lightheadedness with hypotension: Yes Has patient had a PCN reaction causing severe rash involving mucus membranes or skin necrosis: No Has patient had a PCN reaction that required hospitalization: No Has patient had a PCN reaction occurring within the last 10 years: No If all of the above answers are "NO", then may proceed with Cephalosporin use.   . Sulfa Antibiotics Rash    Current Outpatient Medications  Medication Sig Dispense Refill  . folic acid (FOLVITE) 1 MG tablet Take 1 mg by mouth 2 (two) times daily.     . furosemide (LASIX) 20 MG tablet Take 20-40 mg by mouth daily as needed for fluid.     . hydrOXYzine (ATARAX/VISTARIL) 25 MG tablet     . MAGNESIUM PO Take by mouth.    . meloxicam (MOBIC) 15 MG tablet daily.     . mometasone (NASONEX) 50 MCG/ACT nasal spray 2 sprays by Each Nare as needed    . mycophenolate (CELLCEPT) 500 MG tablet Take by mouth.     . NONFORMULARY OR COMPOUNDED ITEM Vit E vaginal suppositories, 200U/ml.  One PV three times weekly. 36 each 3  . SYNTHROID 100 MCG tablet Take 100 mcg by mouth daily before breakfast.     . tamoxifen  (NOLVADEX) 20 MG tablet TAKE 1 TABLET BY MOUTH DAILY (Patient taking differently: TAKE 20 MG BY MOUTH DAILY---  takes in pm) 90 tablet 2  . Turmeric Curcumin 500 MG CAPS Take 500 mg by mouth 2 (two) times daily.      No current facility-administered medications for this visit.     OBJECTIVE: Middle-aged white woman who appears stated age  67:   10/06/17 1005  BP: (!) 113/56  Pulse: 82  Resp: 18  Temp: 98.9 F (37.2 C)  SpO2: 99%     Body mass index is 25.06 kg/m.    ECOG FS: 1 Filed Weights   10/06/17 1005  Weight: 148 lb 4.8 oz (67.3 kg)   Sclerae unicteric, pupils round and equal Oropharynx clear and moist No cervical or supraclavicular adenopathy Lungs no rales or rhonchi Heart regular rate and rhythm Abd soft, nontender, positive bowel sounds MSK no focal spinal tenderness Neuro: nonfocal, well oriented, appropriate affect Breasts: The right breast is benign.  The left breast is smaller, firmer, and slightly darker.  There is some disturbance of the contour secondary to prior surgeries.  Both axillae are benign  LAB RESULTS: Lab Results  Component Value Date   WBC 7.7 07/30/2017   NEUTROABS 5.9 06/09/2017   HGB 11.8 (L) 07/30/2017   HCT 37.1 07/30/2017   MCV 92.1 07/30/2017   PLT 219 07/30/2017      Chemistry      Component Value Date/Time   NA 135 07/30/2017 1121   NA 141 06/09/2017 1117   K 4.3 07/30/2017 1121   K 3.9 06/09/2017 1117   CL 103 07/30/2017 1121   CL 109 (H) 12/14/2012 1253   CO2 25 07/30/2017 1121   CO2 23 06/09/2017 1117   BUN 13 07/30/2017 1121   BUN 11.5 06/09/2017 1117   CREATININE 1.20 (H) 07/30/2017 1121   CREATININE 1.2 (H) 06/09/2017 1117      Component Value Date/Time   CALCIUM 8.8 (L) 07/30/2017 1121   CALCIUM 8.9 06/09/2017 1117   ALKPHOS 45 07/30/2017 1121   ALKPHOS 40 06/09/2017 1117   AST 32 07/30/2017 1121   AST 35 (H) 06/09/2017 1117   ALT 17 07/30/2017 1121   ALT 19 06/09/2017 1117   BILITOT 0.6 07/30/2017  1121   BILITOT 0.37 06/09/2017 1117       STUDIES: She had an echocardiogram completed on 06/04/2017 at [facility] with results showing: LV fraction at 55%.   She has had a CXR on 04/24/2017 with results revealing: left lung infiltrates.   She has had US transvaginal and US pelvis completed on 03/21/2017 with results revealing: 1. Heterogeneous thickening of the endometrium, measuring up to 2 cm thickness, with scattered cystic appearing foci throughout. 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 Consider endometrial sampling, especially if any associated postmenopausal bleeding. 2. Both ovaries appear normal and there is no mass or free fluid seen within either adnexal region.  She has had PET Scan completed on 04/07/2017 with results revealing: No evidence of recurrent or metastatic carcinoma. New bilateral multi-lobar infiltrates with lower lobe predominance, and small bilateral pleural effusions. Differential diagnosis includes infection, drug reaction, or less likely pulmonary edema.   ASSESSMENT: 67 y.o.  [name] woman   (1)  status post left lumpectomy 11/04/2011 for a pT2 pN0, stage IIA invasive ductal carcinoma, grade 2, strongly estrogen and progesterone receptor positive, HER-2 negative, with an MIB-1 of 20%.   (2)  completed radiation treatments June 2013    (3)  started tamoxifen mid-July 2013, to be continued until 2023  (a) endometrial thickening, status post multiple biopsies 04/04/2017 showing no atypia dysplasia or malignancy  (b) D&C with hysteroscopy 08/04/2017 showed inactive endometrium and a benign polyp  (4) breast density category C  (a) status post breast MRI in April 2015 showing 2 abnormalities in the right breast; biopsy of both spots 11/19/2013 showed only fibrocystic PASH   (5) osteopenia with a T score of -1.5 at the left femoral neck 04/18/2014  (  a) on vitamin D supplementation, weightbearing exercise program, and  tamoxifen  (6) scleroderma, with significant arthritis, thickened digits, lung changes, and mild swallowing changes.  PLAN:  [name] is now 6 years out from definitive surgery for her breast cancer with no evidence of disease recurrence.  This is very favorable.  She is tolerating tamoxifen with no side effects that she is aware of and the plan is to continue that for a total of 10 years.  Her scleroderma appears to be responding to CellCept.  She is going to be followed not only at [facility] but also at [facility] and she has signed up for some studies over there.  Unfortunately we are only able to get part of the story from care everywhere and I am not able to load up her [place] into her care team list.  We will try to fax that information to her directly  Normally we would proceed to bilateral mammography at this point but her physician at [facility] is very concerned that she not receive any radiation because of high risk of melanoma developing.  Accordingly I have set her up for an MRI of the breast in May.  I am going to see her on a yearly basis, in June, and states I just saw her this will be June 2020  She knows to call for any other issues that may develop before that visit.   [name], Virgie Dad, MD  10/06/17 10:32 AM Medical Oncology and Hematology [facility] [address] [place], [place] [address] Tel. [phone]    Fax. [phone]    This document serves as a record of services personally performed by Lurline Del, MD. It was created on his behalf by Steva Colder, a trained medical scribe. The creation of this record is based on the scribe's personal observations and the provider's statements to them.   I have reviewed the above documentation for accuracy and completeness, and I agree with the above.

## 2017-10-06 ENCOUNTER — Inpatient Hospital Stay: Payer: Medicare Other | Attending: Oncology | Admitting: Oncology

## 2017-10-06 ENCOUNTER — Telehealth: Payer: Self-pay | Admitting: Oncology

## 2017-10-06 VITALS — BP 113/56 | HR 82 | Temp 98.9°F | Resp 18 | Ht 64.5 in | Wt 148.3 lb

## 2017-10-06 DIAGNOSIS — M3489 Other systemic sclerosis: Secondary | ICD-10-CM

## 2017-10-06 DIAGNOSIS — L812 Freckles: Secondary | ICD-10-CM | POA: Diagnosis not present

## 2017-10-06 DIAGNOSIS — D225 Melanocytic nevi of trunk: Secondary | ICD-10-CM | POA: Diagnosis not present

## 2017-10-06 DIAGNOSIS — L299 Pruritus, unspecified: Secondary | ICD-10-CM | POA: Diagnosis not present

## 2017-10-06 DIAGNOSIS — F329 Major depressive disorder, single episode, unspecified: Secondary | ICD-10-CM | POA: Diagnosis not present

## 2017-10-06 DIAGNOSIS — Z8042 Family history of malignant neoplasm of prostate: Secondary | ICD-10-CM

## 2017-10-06 DIAGNOSIS — Z79899 Other long term (current) drug therapy: Secondary | ICD-10-CM | POA: Diagnosis not present

## 2017-10-06 DIAGNOSIS — D2261 Melanocytic nevi of right upper limb, including shoulder: Secondary | ICD-10-CM | POA: Diagnosis not present

## 2017-10-06 DIAGNOSIS — L720 Epidermal cyst: Secondary | ICD-10-CM | POA: Diagnosis not present

## 2017-10-06 DIAGNOSIS — I7300 Raynaud's syndrome without gangrene: Secondary | ICD-10-CM | POA: Diagnosis not present

## 2017-10-06 DIAGNOSIS — M8580 Other specified disorders of bone density and structure, unspecified site: Secondary | ICD-10-CM | POA: Diagnosis not present

## 2017-10-06 DIAGNOSIS — Z79810 Long term (current) use of selective estrogen receptor modulators (SERMs): Secondary | ICD-10-CM | POA: Diagnosis not present

## 2017-10-06 DIAGNOSIS — Z923 Personal history of irradiation: Secondary | ICD-10-CM

## 2017-10-06 DIAGNOSIS — Z170 Estrogen receptor positive status [ER+]: Secondary | ICD-10-CM | POA: Diagnosis not present

## 2017-10-06 DIAGNOSIS — E039 Hypothyroidism, unspecified: Secondary | ICD-10-CM

## 2017-10-06 DIAGNOSIS — R21 Rash and other nonspecific skin eruption: Secondary | ICD-10-CM | POA: Diagnosis not present

## 2017-10-06 DIAGNOSIS — L821 Other seborrheic keratosis: Secondary | ICD-10-CM | POA: Diagnosis not present

## 2017-10-06 DIAGNOSIS — C50412 Malignant neoplasm of upper-outer quadrant of left female breast: Secondary | ICD-10-CM | POA: Diagnosis not present

## 2017-10-06 DIAGNOSIS — D2262 Melanocytic nevi of left upper limb, including shoulder: Secondary | ICD-10-CM | POA: Diagnosis not present

## 2017-10-06 DIAGNOSIS — M349 Systemic sclerosis, unspecified: Secondary | ICD-10-CM | POA: Diagnosis not present

## 2017-10-06 DIAGNOSIS — M1990 Unspecified osteoarthritis, unspecified site: Secondary | ICD-10-CM

## 2017-10-06 DIAGNOSIS — D485 Neoplasm of uncertain behavior of skin: Secondary | ICD-10-CM | POA: Diagnosis not present

## 2017-10-06 DIAGNOSIS — E785 Hyperlipidemia, unspecified: Secondary | ICD-10-CM

## 2017-10-06 DIAGNOSIS — I788 Other diseases of capillaries: Secondary | ICD-10-CM | POA: Diagnosis not present

## 2017-10-06 DIAGNOSIS — M340 Progressive systemic sclerosis: Secondary | ICD-10-CM | POA: Diagnosis not present

## 2017-10-06 DIAGNOSIS — C44619 Basal cell carcinoma of skin of left upper limb, including shoulder: Secondary | ICD-10-CM | POA: Diagnosis not present

## 2017-10-06 NOTE — Telephone Encounter (Signed)
Cancelled appt per 3/25 los - template for June 5449 isn't available - patient will call back.

## 2017-10-07 DIAGNOSIS — M349 Systemic sclerosis, unspecified: Secondary | ICD-10-CM | POA: Diagnosis not present

## 2017-10-07 DIAGNOSIS — R0602 Shortness of breath: Secondary | ICD-10-CM | POA: Diagnosis not present

## 2017-10-07 DIAGNOSIS — J849 Interstitial pulmonary disease, unspecified: Secondary | ICD-10-CM | POA: Diagnosis not present

## 2017-10-08 DIAGNOSIS — Z79899 Other long term (current) drug therapy: Secondary | ICD-10-CM | POA: Diagnosis not present

## 2017-10-08 DIAGNOSIS — D6489 Other specified anemias: Secondary | ICD-10-CM | POA: Diagnosis not present

## 2017-10-08 DIAGNOSIS — M79646 Pain in unspecified finger(s): Secondary | ICD-10-CM | POA: Diagnosis not present

## 2017-10-08 DIAGNOSIS — R29898 Other symptoms and signs involving the musculoskeletal system: Secondary | ICD-10-CM | POA: Diagnosis not present

## 2017-10-08 DIAGNOSIS — M79643 Pain in unspecified hand: Secondary | ICD-10-CM | POA: Diagnosis not present

## 2017-10-08 DIAGNOSIS — M349 Systemic sclerosis, unspecified: Secondary | ICD-10-CM | POA: Diagnosis not present

## 2017-10-08 DIAGNOSIS — M25642 Stiffness of left hand, not elsewhere classified: Secondary | ICD-10-CM | POA: Diagnosis not present

## 2017-10-08 DIAGNOSIS — M25641 Stiffness of right hand, not elsewhere classified: Secondary | ICD-10-CM | POA: Diagnosis not present

## 2017-10-10 DIAGNOSIS — Z79899 Other long term (current) drug therapy: Secondary | ICD-10-CM | POA: Diagnosis not present

## 2017-10-10 DIAGNOSIS — D649 Anemia, unspecified: Secondary | ICD-10-CM | POA: Diagnosis not present

## 2017-10-10 DIAGNOSIS — D6489 Other specified anemias: Secondary | ICD-10-CM | POA: Diagnosis not present

## 2017-10-15 ENCOUNTER — Ambulatory Visit
Admission: RE | Admit: 2017-10-15 | Discharge: 2017-10-15 | Disposition: A | Discharge status: Home / Self Care | Payer: Medicare Other | Source: Ambulatory Visit | Attending: Oncology | Admitting: Oncology

## 2017-10-15 DIAGNOSIS — Z170 Estrogen receptor positive status [ER+]: Principal | ICD-10-CM

## 2017-10-15 DIAGNOSIS — Z853 Personal history of malignant neoplasm of breast: Secondary | ICD-10-CM | POA: Diagnosis not present

## 2017-10-15 DIAGNOSIS — M349 Systemic sclerosis, unspecified: Secondary | ICD-10-CM

## 2017-10-15 DIAGNOSIS — M3489 Other systemic sclerosis: Secondary | ICD-10-CM

## 2017-10-15 DIAGNOSIS — C50412 Malignant neoplasm of upper-outer quadrant of left female breast: Secondary | ICD-10-CM

## 2017-10-15 MED ORDER — GADOBENATE DIMEGLUMINE 529 MG/ML IV SOLN
9.0000 mL | Freq: Once | INTRAVENOUS | Status: AC | PRN
  Administered 2017-10-15: 9 mL via INTRAVENOUS

## 2017-10-16 ENCOUNTER — Other Ambulatory Visit: Payer: Self-pay | Admitting: Oncology

## 2017-10-16 ENCOUNTER — Encounter: Payer: Self-pay | Admitting: Oncology

## 2017-10-20 DIAGNOSIS — M25642 Stiffness of left hand, not elsewhere classified: Secondary | ICD-10-CM | POA: Diagnosis not present

## 2017-10-20 DIAGNOSIS — M79646 Pain in unspecified finger(s): Secondary | ICD-10-CM | POA: Diagnosis not present

## 2017-10-20 DIAGNOSIS — M79643 Pain in unspecified hand: Secondary | ICD-10-CM | POA: Diagnosis not present

## 2017-10-20 DIAGNOSIS — R29898 Other symptoms and signs involving the musculoskeletal system: Secondary | ICD-10-CM | POA: Diagnosis not present

## 2017-10-20 DIAGNOSIS — M349 Systemic sclerosis, unspecified: Secondary | ICD-10-CM | POA: Diagnosis not present

## 2017-10-20 DIAGNOSIS — M25641 Stiffness of right hand, not elsewhere classified: Secondary | ICD-10-CM | POA: Diagnosis not present

## 2017-10-21 DIAGNOSIS — L299 Pruritus, unspecified: Secondary | ICD-10-CM | POA: Diagnosis not present

## 2017-10-21 DIAGNOSIS — G629 Polyneuropathy, unspecified: Secondary | ICD-10-CM | POA: Diagnosis not present

## 2017-10-21 DIAGNOSIS — M349 Systemic sclerosis, unspecified: Secondary | ICD-10-CM | POA: Diagnosis not present

## 2017-10-21 DIAGNOSIS — Z79899 Other long term (current) drug therapy: Secondary | ICD-10-CM | POA: Diagnosis not present

## 2017-10-21 DIAGNOSIS — Z853 Personal history of malignant neoplasm of breast: Secondary | ICD-10-CM | POA: Diagnosis not present

## 2017-10-21 DIAGNOSIS — I7300 Raynaud's syndrome without gangrene: Secondary | ICD-10-CM | POA: Diagnosis not present

## 2017-10-27 DIAGNOSIS — M349 Systemic sclerosis, unspecified: Secondary | ICD-10-CM | POA: Diagnosis not present

## 2017-10-27 DIAGNOSIS — M25641 Stiffness of right hand, not elsewhere classified: Secondary | ICD-10-CM | POA: Diagnosis not present

## 2017-10-27 DIAGNOSIS — M79646 Pain in unspecified finger(s): Secondary | ICD-10-CM | POA: Diagnosis not present

## 2017-10-27 DIAGNOSIS — M25642 Stiffness of left hand, not elsewhere classified: Secondary | ICD-10-CM | POA: Diagnosis not present

## 2017-10-27 DIAGNOSIS — R29898 Other symptoms and signs involving the musculoskeletal system: Secondary | ICD-10-CM | POA: Diagnosis not present

## 2017-10-27 DIAGNOSIS — M79643 Pain in unspecified hand: Secondary | ICD-10-CM | POA: Diagnosis not present

## 2017-11-03 DIAGNOSIS — M25642 Stiffness of left hand, not elsewhere classified: Secondary | ICD-10-CM | POA: Diagnosis not present

## 2017-11-03 DIAGNOSIS — R29898 Other symptoms and signs involving the musculoskeletal system: Secondary | ICD-10-CM | POA: Diagnosis not present

## 2017-11-03 DIAGNOSIS — M25641 Stiffness of right hand, not elsewhere classified: Secondary | ICD-10-CM | POA: Diagnosis not present

## 2017-11-09 ENCOUNTER — Other Ambulatory Visit: Payer: Self-pay | Admitting: Oncology

## 2017-11-10 DIAGNOSIS — M79643 Pain in unspecified hand: Secondary | ICD-10-CM | POA: Diagnosis not present

## 2017-11-10 DIAGNOSIS — M25641 Stiffness of right hand, not elsewhere classified: Secondary | ICD-10-CM | POA: Diagnosis not present

## 2017-11-10 DIAGNOSIS — M25642 Stiffness of left hand, not elsewhere classified: Secondary | ICD-10-CM | POA: Diagnosis not present

## 2017-11-10 DIAGNOSIS — M349 Systemic sclerosis, unspecified: Secondary | ICD-10-CM | POA: Diagnosis not present

## 2017-11-10 DIAGNOSIS — D6489 Other specified anemias: Secondary | ICD-10-CM | POA: Diagnosis not present

## 2017-11-10 DIAGNOSIS — M79646 Pain in unspecified finger(s): Secondary | ICD-10-CM | POA: Diagnosis not present

## 2017-11-10 DIAGNOSIS — R29898 Other symptoms and signs involving the musculoskeletal system: Secondary | ICD-10-CM | POA: Diagnosis not present

## 2017-11-11 DIAGNOSIS — M349 Systemic sclerosis, unspecified: Secondary | ICD-10-CM | POA: Diagnosis not present

## 2017-11-17 DIAGNOSIS — M79643 Pain in unspecified hand: Secondary | ICD-10-CM | POA: Diagnosis not present

## 2017-11-17 DIAGNOSIS — M79646 Pain in unspecified finger(s): Secondary | ICD-10-CM | POA: Diagnosis not present

## 2017-11-17 DIAGNOSIS — M25642 Stiffness of left hand, not elsewhere classified: Secondary | ICD-10-CM | POA: Diagnosis not present

## 2017-11-17 DIAGNOSIS — R29898 Other symptoms and signs involving the musculoskeletal system: Secondary | ICD-10-CM | POA: Diagnosis not present

## 2017-11-17 DIAGNOSIS — M25641 Stiffness of right hand, not elsewhere classified: Secondary | ICD-10-CM | POA: Diagnosis not present

## 2017-11-17 DIAGNOSIS — M349 Systemic sclerosis, unspecified: Secondary | ICD-10-CM | POA: Diagnosis not present

## 2017-11-18 ENCOUNTER — Encounter: Payer: Medicare Other | Admitting: Adult Health

## 2017-11-20 ENCOUNTER — Encounter: Payer: Medicare Other | Admitting: Adult Health

## 2017-11-24 DIAGNOSIS — M25641 Stiffness of right hand, not elsewhere classified: Secondary | ICD-10-CM | POA: Diagnosis not present

## 2017-11-24 DIAGNOSIS — R29898 Other symptoms and signs involving the musculoskeletal system: Secondary | ICD-10-CM | POA: Diagnosis not present

## 2017-11-24 DIAGNOSIS — M25642 Stiffness of left hand, not elsewhere classified: Secondary | ICD-10-CM | POA: Diagnosis not present

## 2017-12-02 ENCOUNTER — Ambulatory Visit: Payer: Medicare Other | Admitting: Internal Medicine

## 2017-12-09 DIAGNOSIS — M349 Systemic sclerosis, unspecified: Secondary | ICD-10-CM | POA: Diagnosis not present

## 2017-12-09 DIAGNOSIS — M25641 Stiffness of right hand, not elsewhere classified: Secondary | ICD-10-CM | POA: Diagnosis not present

## 2017-12-09 DIAGNOSIS — R29898 Other symptoms and signs involving the musculoskeletal system: Secondary | ICD-10-CM | POA: Diagnosis not present

## 2017-12-09 DIAGNOSIS — M79646 Pain in unspecified finger(s): Secondary | ICD-10-CM | POA: Diagnosis not present

## 2017-12-09 DIAGNOSIS — M25642 Stiffness of left hand, not elsewhere classified: Secondary | ICD-10-CM | POA: Diagnosis not present

## 2017-12-09 DIAGNOSIS — M79643 Pain in unspecified hand: Secondary | ICD-10-CM | POA: Diagnosis not present

## 2017-12-11 ENCOUNTER — Encounter: Payer: Self-pay | Admitting: *Deleted

## 2017-12-15 DIAGNOSIS — M25642 Stiffness of left hand, not elsewhere classified: Secondary | ICD-10-CM | POA: Diagnosis not present

## 2017-12-15 DIAGNOSIS — R29898 Other symptoms and signs involving the musculoskeletal system: Secondary | ICD-10-CM | POA: Diagnosis not present

## 2017-12-15 DIAGNOSIS — M79646 Pain in unspecified finger(s): Secondary | ICD-10-CM | POA: Diagnosis not present

## 2017-12-15 DIAGNOSIS — M79643 Pain in unspecified hand: Secondary | ICD-10-CM | POA: Diagnosis not present

## 2017-12-15 DIAGNOSIS — M25641 Stiffness of right hand, not elsewhere classified: Secondary | ICD-10-CM | POA: Diagnosis not present

## 2017-12-15 DIAGNOSIS — M349 Systemic sclerosis, unspecified: Secondary | ICD-10-CM | POA: Diagnosis not present

## 2017-12-22 ENCOUNTER — Ambulatory Visit (INDEPENDENT_AMBULATORY_CARE_PROVIDER_SITE_OTHER): Payer: Medicare Other | Admitting: Internal Medicine

## 2017-12-22 ENCOUNTER — Encounter

## 2017-12-22 ENCOUNTER — Encounter: Payer: Self-pay | Admitting: Internal Medicine

## 2017-12-22 VITALS — BP 112/68 | HR 80 | Ht 63.5 in | Wt 146.1 lb

## 2017-12-22 DIAGNOSIS — M25642 Stiffness of left hand, not elsewhere classified: Secondary | ICD-10-CM | POA: Diagnosis not present

## 2017-12-22 DIAGNOSIS — D649 Anemia, unspecified: Secondary | ICD-10-CM | POA: Diagnosis not present

## 2017-12-22 DIAGNOSIS — M349 Systemic sclerosis, unspecified: Secondary | ICD-10-CM | POA: Diagnosis not present

## 2017-12-22 DIAGNOSIS — Z860100 Personal history of colon polyps, unspecified: Secondary | ICD-10-CM

## 2017-12-22 DIAGNOSIS — M25641 Stiffness of right hand, not elsewhere classified: Secondary | ICD-10-CM | POA: Diagnosis not present

## 2017-12-22 DIAGNOSIS — K224 Dyskinesia of esophagus: Secondary | ICD-10-CM | POA: Diagnosis not present

## 2017-12-22 DIAGNOSIS — M79643 Pain in unspecified hand: Secondary | ICD-10-CM | POA: Diagnosis not present

## 2017-12-22 DIAGNOSIS — M79646 Pain in unspecified finger(s): Secondary | ICD-10-CM | POA: Diagnosis not present

## 2017-12-22 DIAGNOSIS — R29898 Other symptoms and signs involving the musculoskeletal system: Secondary | ICD-10-CM | POA: Diagnosis not present

## 2017-12-22 NOTE — Progress Notes (Signed)
Subjective:    Patient ID: [name], female    DOB: 1951/05/26, 67 y.o.   MRN: [id number]  HPI [name] is a 67 year old female with a history of scleroderma with systemic sclerosis, breast cancer on tamoxifen, hypothyroidism, hyperlipidemia, esophageal dysmotility, history of colon polyp (one polyp removed at colonoscopy 07/22/2014) who is here for follow-up.  She is here today with her husband and was initially seen on 06/11/2017.  After this visit we performed a barium esophagram with tablet given her history of dysphagia which had resolved at the time of her visit with me but also with knowledge of her scleroderma and the association with esophageal dysmotility.  This study was performed and was normal though did show some mild dysmotility which was nonspecific.  Today she has no change in her GI symptom.  She continues to have issues with her scleroderma and follows with Dr. Manuella Ghazi at [facility] and Dr. Manuella Ghazi at [facility].  She has been continued on CellCept and also tamoxifen.  She has been placed on doxepin because she develops severe itching primarily located on the skin of her abdomen.  This can be associated with redness and erythema.  It is most predominant in the early afternoon to evening and lasts about 2 hours.  No change in her swallowing symptom.  No change in her bowel habits.  She was diagnosed with a mild anemia and I have received a copy of her blood work from [facility].  This was performed on 10/03/2017.  Her white count was normal at 8.6.  Her hemoglobin was slightly decreased at 10.9.  MCV was normal at 90.5.  Platelets were normal at 237.  Last year she had a normal ferritin.  And per the records her iron studies were not decreased.  Dr. Manuella Ghazi at [facility] suggested that due to her mild anemia that she be considered for upper endoscopy to look for GAVE.  This condition has an association with scleroderma.  Review of Systems As per HPI, otherwise  negative  Current Medications, Allergies, Past Medical History, Past Surgical History, Family History and Social History were reviewed in [employer] record.     Objective:   Physical Exam BP 112/68 (BP Location: Right Arm, Patient Position: Sitting, Cuff Size: Normal)   Pulse 80   Ht 5' 3.5" (1.613 m) Comment: height measured without shoes  Wt 146 lb 2 oz (66.3 kg)   BMI 25.48 kg/m  Constitutional: Well-developed and well-nourished. No distress. HEENT: Normocephalic and atraumatic.  Conjunctivae are normal.  No scleral icterus. Neck: Neck supple. Trachea midline. Cardiovascular: Normal rate, regular rhythm and intact distal pulses. Pulmonary/chest: Effort normal and breath sounds normal. Abdominal: Soft, nontender, nondistended. Bowel sounds active throughout.  Extremities: no clubbing, cyanosis, or edema Neurological: Alert and oriented to person place and time. Skin: Skin is warm and dry. Psychiatric: Normal mood and affect. Behavior is normal.  Scleroderma.  Evaluate for stricture or dysmotility   EXAM: ESOPHOGRAM / BARIUM SWALLOW / BARIUM TABLET STUDY   TECHNIQUE: Combined double contrast and single contrast examination performed using effervescent crystals, thick barium liquid, and thin barium liquid. The patient was observed with fluoroscopy swallowing a 13 mm barium sulphate tablet.   FLUOROSCOPY TIME:  Fluoroscopy Time:  2.2 minutes   Radiation Exposure Index (if provided by the fluoroscopic device): 23.1 mGy   Number of Acquired Spot Images: 0   COMPARISON:  None.   FINDINGS: Fluoroscopic evaluation of swallowing demonstrates disruption of 2  out of 4 primary esophageal peristaltic waves with proximal escape. No fixed esophageal stricture. No fold thickening or mass. Small hiatal hernia. No reflux with the water siphon maneuver. The patient swallowed a 13 mm barium tablet which freely passed into the stomach.   IMPRESSION: No  esophageal stricture.   Small hiatal hernia.   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     Electronically Signed   By: Rolm Baptise M.D.   On: 07/18/2017 10:35       Assessment & Plan:  67 year old female with a history of scleroderma with systemic sclerosis, breast cancer on tamoxifen, hypothyroidism, hyperlipidemia, esophageal dysmotility, history of colon polyp (one polyp removed at colonoscopy 07/22/2014) who is here for follow-up.   1. Mild anemia, normal iron --her anemia is mild and there is no evidence for active bleeding or significant iron deficiency.  It is reasonable to exclude GAVE given association with scleroderma as a source of anemia.  We discussed upper endoscopy including the risk, benefits and alternatives and she is agreeable and wishes to proceed.  I will have this test performed in the outpatient hospital setting due to the availability of APC should GAVE be found to need to be treated.  2.  History of colon polyps --excellent bowel preparation in 2016.  One 4 mm polyp removed.  Surveillance colonoscopy would be indicated in January 2021.  At this time I do not see reason to repeat this test early.    3.  Scleroderma with systemic sclerosis --followed closely with rheumatology at [place].  4.  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    25 minutes spent with the patient today. Greater than 50% was spent in counseling and coordination of care with the patient

## 2017-12-22 NOTE — Patient Instructions (Signed)
You have been scheduled for an endoscopy. Please follow written instructions given to you at your visit today. If you use inhalers (even only as needed), please bring them with you on the day of your procedure. Your physician has requested that you go to www.startemmi.com and enter the access code given to you at your visit today. This web site gives a general overview about your procedure. However, you should still follow specific instructions given to you by our office regarding your preparation for the procedure.  If you are age 24 or older, your body mass index should be between 23-30. Your Body mass index is 25.48 kg/m. If this is out of the aforementioned range listed, please consider follow up with your Primary Care Provider.  If you are age 62 or younger, your body mass index should be between 19-25. Your Body mass index is 25.48 kg/m. If this is out of the aformentioned range listed, please consider follow up with your Primary Care Provider.

## 2017-12-23 DIAGNOSIS — M349 Systemic sclerosis, unspecified: Secondary | ICD-10-CM | POA: Diagnosis not present

## 2017-12-23 DIAGNOSIS — R0600 Dyspnea, unspecified: Secondary | ICD-10-CM | POA: Diagnosis not present

## 2017-12-23 DIAGNOSIS — R05 Cough: Secondary | ICD-10-CM | POA: Diagnosis not present

## 2017-12-23 DIAGNOSIS — R0602 Shortness of breath: Secondary | ICD-10-CM | POA: Diagnosis not present

## 2017-12-23 DIAGNOSIS — J8410 Pulmonary fibrosis, unspecified: Secondary | ICD-10-CM | POA: Diagnosis not present

## 2017-12-23 DIAGNOSIS — J849 Interstitial pulmonary disease, unspecified: Secondary | ICD-10-CM | POA: Diagnosis not present

## 2017-12-26 DIAGNOSIS — E038 Other specified hypothyroidism: Secondary | ICD-10-CM | POA: Diagnosis not present

## 2017-12-26 DIAGNOSIS — R82998 Other abnormal findings in urine: Secondary | ICD-10-CM | POA: Diagnosis not present

## 2017-12-29 DIAGNOSIS — R29898 Other symptoms and signs involving the musculoskeletal system: Secondary | ICD-10-CM | POA: Diagnosis not present

## 2017-12-29 DIAGNOSIS — M25642 Stiffness of left hand, not elsewhere classified: Secondary | ICD-10-CM | POA: Diagnosis not present

## 2017-12-29 DIAGNOSIS — M25641 Stiffness of right hand, not elsewhere classified: Secondary | ICD-10-CM | POA: Diagnosis not present

## 2017-12-30 DIAGNOSIS — D6489 Other specified anemias: Secondary | ICD-10-CM | POA: Diagnosis not present

## 2017-12-30 DIAGNOSIS — I872 Venous insufficiency (chronic) (peripheral): Secondary | ICD-10-CM | POA: Diagnosis not present

## 2017-12-30 DIAGNOSIS — Z1389 Encounter for screening for other disorder: Secondary | ICD-10-CM | POA: Diagnosis not present

## 2017-12-30 DIAGNOSIS — D8989 Other specified disorders involving the immune mechanism, not elsewhere classified: Secondary | ICD-10-CM | POA: Diagnosis not present

## 2017-12-30 DIAGNOSIS — J849 Interstitial pulmonary disease, unspecified: Secondary | ICD-10-CM | POA: Diagnosis not present

## 2017-12-30 DIAGNOSIS — M3489 Other systemic sclerosis: Secondary | ICD-10-CM | POA: Diagnosis not present

## 2017-12-30 DIAGNOSIS — Z0000 Encounter for general adult medical examination without abnormal findings: Secondary | ICD-10-CM | POA: Diagnosis not present

## 2017-12-30 DIAGNOSIS — G6289 Other specified polyneuropathies: Secondary | ICD-10-CM | POA: Diagnosis not present

## 2017-12-30 DIAGNOSIS — Z6825 Body mass index (BMI) 25.0-25.9, adult: Secondary | ICD-10-CM | POA: Diagnosis not present

## 2017-12-30 DIAGNOSIS — E038 Other specified hypothyroidism: Secondary | ICD-10-CM | POA: Diagnosis not present

## 2017-12-30 DIAGNOSIS — R1310 Dysphagia, unspecified: Secondary | ICD-10-CM | POA: Diagnosis not present

## 2017-12-30 DIAGNOSIS — Z853 Personal history of malignant neoplasm of breast: Secondary | ICD-10-CM | POA: Diagnosis not present

## 2018-01-01 DIAGNOSIS — Z1212 Encounter for screening for malignant neoplasm of rectum: Secondary | ICD-10-CM | POA: Diagnosis not present

## 2018-01-06 ENCOUNTER — Telehealth: Payer: Self-pay | Admitting: Internal Medicine

## 2018-01-06 DIAGNOSIS — R1310 Dysphagia, unspecified: Secondary | ICD-10-CM

## 2018-01-06 DIAGNOSIS — Z86010 Personal history of colonic polyps: Secondary | ICD-10-CM

## 2018-01-06 DIAGNOSIS — Z79899 Other long term (current) drug therapy: Secondary | ICD-10-CM | POA: Diagnosis not present

## 2018-01-06 DIAGNOSIS — I7300 Raynaud's syndrome without gangrene: Secondary | ICD-10-CM | POA: Diagnosis not present

## 2018-01-06 DIAGNOSIS — M349 Systemic sclerosis, unspecified: Secondary | ICD-10-CM | POA: Diagnosis not present

## 2018-01-06 MED ORDER — SUPREP BOWEL PREP KIT 17.5-3.13-1.6 GM/177ML PO SOLN: 1.0000 | ORAL | 0 refills | Status: DC

## 2018-01-06 NOTE — Telephone Encounter (Signed)
I have spoken to patient to advise that we have added colonoscopy with propofol on to originally scheduled endoscopy at [facility]. Her time had to be changed to 10 am 01/29/18 with 8:30 am arrival. I have given verbal prep instructions and have sent these to her home address as well. I have advised of time/date/location/prep and she verbalizes understanding of all of this. She is also aware that she will need a care partner 1 or older to drive her to and from her procedure since she will be sedated. She will call us back with any questions.

## 2018-01-06 NOTE — Telephone Encounter (Signed)
Ok to add on colonoscopy to scheduled EGD at recommendation of her rheumatologist at [facility] given high risk of cancer in patients with new onset scleroderma and RNA polymerase III Abs

## 2018-01-06 NOTE — Telephone Encounter (Signed)
Dr Hilarie Fredrickson, please see Dr Manuella Ghazi' note [facility]) from today, 01/06/18. Please advise regarding adding colon to already scheduled EGD at hospital on 01/29/18. Please note, patient would need to be rescheduled for a different date or time in order to add colon to endo (procedure already scheduled to follow this case)

## 2018-01-08 DIAGNOSIS — M349 Systemic sclerosis, unspecified: Secondary | ICD-10-CM | POA: Diagnosis not present

## 2018-01-08 DIAGNOSIS — M25642 Stiffness of left hand, not elsewhere classified: Secondary | ICD-10-CM | POA: Diagnosis not present

## 2018-01-08 DIAGNOSIS — R29898 Other symptoms and signs involving the musculoskeletal system: Secondary | ICD-10-CM | POA: Diagnosis not present

## 2018-01-08 DIAGNOSIS — M79643 Pain in unspecified hand: Secondary | ICD-10-CM | POA: Diagnosis not present

## 2018-01-08 DIAGNOSIS — M25641 Stiffness of right hand, not elsewhere classified: Secondary | ICD-10-CM | POA: Diagnosis not present

## 2018-01-08 DIAGNOSIS — M79646 Pain in unspecified finger(s): Secondary | ICD-10-CM | POA: Diagnosis not present

## 2018-01-23 DIAGNOSIS — M79646 Pain in unspecified finger(s): Secondary | ICD-10-CM | POA: Diagnosis not present

## 2018-01-23 DIAGNOSIS — M79643 Pain in unspecified hand: Secondary | ICD-10-CM | POA: Diagnosis not present

## 2018-01-23 DIAGNOSIS — R29898 Other symptoms and signs involving the musculoskeletal system: Secondary | ICD-10-CM | POA: Diagnosis not present

## 2018-01-23 DIAGNOSIS — M25641 Stiffness of right hand, not elsewhere classified: Secondary | ICD-10-CM | POA: Diagnosis not present

## 2018-01-23 DIAGNOSIS — M25642 Stiffness of left hand, not elsewhere classified: Secondary | ICD-10-CM | POA: Diagnosis not present

## 2018-01-23 DIAGNOSIS — M349 Systemic sclerosis, unspecified: Secondary | ICD-10-CM | POA: Diagnosis not present

## 2018-01-26 DIAGNOSIS — M859 Disorder of bone density and structure, unspecified: Secondary | ICD-10-CM | POA: Diagnosis not present

## 2018-01-27 DIAGNOSIS — R29898 Other symptoms and signs involving the musculoskeletal system: Secondary | ICD-10-CM | POA: Diagnosis not present

## 2018-01-27 DIAGNOSIS — M25642 Stiffness of left hand, not elsewhere classified: Secondary | ICD-10-CM | POA: Diagnosis not present

## 2018-01-27 DIAGNOSIS — M25641 Stiffness of right hand, not elsewhere classified: Secondary | ICD-10-CM | POA: Diagnosis not present

## 2018-01-29 ENCOUNTER — Ambulatory Visit (HOSPITAL_COMMUNITY): Payer: Medicare Other | Admitting: Anesthesiology

## 2018-01-29 ENCOUNTER — Other Ambulatory Visit: Payer: Self-pay

## 2018-01-29 ENCOUNTER — Encounter (HOSPITAL_COMMUNITY): Payer: Self-pay

## 2018-01-29 ENCOUNTER — Ambulatory Visit (HOSPITAL_COMMUNITY)
Admission: RE | Admit: 2018-01-29 | Discharge: 2018-01-29 | Disposition: A | Discharge status: Home / Self Care | Payer: Medicare Other | Source: Ambulatory Visit | Attending: Internal Medicine | Admitting: Internal Medicine

## 2018-01-29 ENCOUNTER — Encounter (HOSPITAL_COMMUNITY)
Admission: RE | Disposition: A | Discharge status: Home / Self Care | Payer: Self-pay | Source: Ambulatory Visit | Attending: Internal Medicine

## 2018-01-29 DIAGNOSIS — E039 Hypothyroidism, unspecified: Secondary | ICD-10-CM | POA: Insufficient documentation

## 2018-01-29 DIAGNOSIS — Z8249 Family history of ischemic heart disease and other diseases of the circulatory system: Secondary | ICD-10-CM | POA: Insufficient documentation

## 2018-01-29 DIAGNOSIS — M349 Systemic sclerosis, unspecified: Secondary | ICD-10-CM | POA: Diagnosis not present

## 2018-01-29 DIAGNOSIS — K209 Esophagitis, unspecified without bleeding: Secondary | ICD-10-CM

## 2018-01-29 DIAGNOSIS — Z853 Personal history of malignant neoplasm of breast: Secondary | ICD-10-CM | POA: Insufficient documentation

## 2018-01-29 DIAGNOSIS — Z888 Allergy status to other drugs, medicaments and biological substances status: Secondary | ICD-10-CM | POA: Insufficient documentation

## 2018-01-29 DIAGNOSIS — K2980 Duodenitis without bleeding: Secondary | ICD-10-CM | POA: Insufficient documentation

## 2018-01-29 DIAGNOSIS — F329 Major depressive disorder, single episode, unspecified: Secondary | ICD-10-CM | POA: Insufficient documentation

## 2018-01-29 DIAGNOSIS — Z923 Personal history of irradiation: Secondary | ICD-10-CM | POA: Insufficient documentation

## 2018-01-29 DIAGNOSIS — Z880 Allergy status to penicillin: Secondary | ICD-10-CM | POA: Insufficient documentation

## 2018-01-29 DIAGNOSIS — Z885 Allergy status to narcotic agent status: Secondary | ICD-10-CM | POA: Diagnosis not present

## 2018-01-29 DIAGNOSIS — Z91041 Radiographic dye allergy status: Secondary | ICD-10-CM | POA: Insufficient documentation

## 2018-01-29 DIAGNOSIS — D631 Anemia in chronic kidney disease: Secondary | ICD-10-CM

## 2018-01-29 DIAGNOSIS — K644 Residual hemorrhoidal skin tags: Secondary | ICD-10-CM | POA: Diagnosis not present

## 2018-01-29 DIAGNOSIS — M1990 Unspecified osteoarthritis, unspecified site: Secondary | ICD-10-CM | POA: Insufficient documentation

## 2018-01-29 DIAGNOSIS — M3481 Systemic sclerosis with lung involvement: Secondary | ICD-10-CM | POA: Insufficient documentation

## 2018-01-29 DIAGNOSIS — Z86010 Personal history of colonic polyps: Secondary | ICD-10-CM | POA: Diagnosis not present

## 2018-01-29 DIAGNOSIS — I7300 Raynaud's syndrome without gangrene: Secondary | ICD-10-CM | POA: Insufficient documentation

## 2018-01-29 DIAGNOSIS — K210 Gastro-esophageal reflux disease with esophagitis: Secondary | ICD-10-CM | POA: Diagnosis not present

## 2018-01-29 DIAGNOSIS — I739 Peripheral vascular disease, unspecified: Secondary | ICD-10-CM | POA: Insufficient documentation

## 2018-01-29 DIAGNOSIS — K2950 Unspecified chronic gastritis without bleeding: Secondary | ICD-10-CM | POA: Insufficient documentation

## 2018-01-29 DIAGNOSIS — Z79899 Other long term (current) drug therapy: Secondary | ICD-10-CM | POA: Insufficient documentation

## 2018-01-29 DIAGNOSIS — M8580 Other specified disorders of bone density and structure, unspecified site: Secondary | ICD-10-CM | POA: Diagnosis not present

## 2018-01-29 DIAGNOSIS — K2210 Ulcer of esophagus without bleeding: Secondary | ICD-10-CM

## 2018-01-29 DIAGNOSIS — E785 Hyperlipidemia, unspecified: Secondary | ICD-10-CM | POA: Insufficient documentation

## 2018-01-29 DIAGNOSIS — Z1211 Encounter for screening for malignant neoplasm of colon: Secondary | ICD-10-CM | POA: Diagnosis not present

## 2018-01-29 DIAGNOSIS — D649 Anemia, unspecified: Secondary | ICD-10-CM

## 2018-01-29 DIAGNOSIS — K449 Diaphragmatic hernia without obstruction or gangrene: Secondary | ICD-10-CM | POA: Insufficient documentation

## 2018-01-29 DIAGNOSIS — K648 Other hemorrhoids: Secondary | ICD-10-CM | POA: Diagnosis not present

## 2018-01-29 HISTORY — PX: COLONOSCOPY WITH PROPOFOL: SHX5780

## 2018-01-29 HISTORY — PX: BIOPSY: SHX5522

## 2018-01-29 HISTORY — PX: ESOPHAGOGASTRODUODENOSCOPY (EGD) WITH PROPOFOL: SHX5813

## 2018-01-29 SURGERY — ESOPHAGOGASTRODUODENOSCOPY (EGD) WITH PROPOFOL
Anesthesia: Monitor Anesthesia Care

## 2018-01-29 MED ORDER — PANTOPRAZOLE SODIUM 40 MG PO TBEC: 40.0000 mg | DELAYED_RELEASE_TABLET | Freq: Two times a day (BID) | ORAL | 2 refills | Status: DC

## 2018-01-29 MED ORDER — PROPOFOL 10 MG/ML IV BOLUS
INTRAVENOUS | Status: AC
  Filled 2018-01-29: qty 60

## 2018-01-29 MED ORDER — PROPOFOL 500 MG/50ML IV EMUL
INTRAVENOUS | Status: DC | PRN
  Administered 2018-01-29: 120 ug/kg/min via INTRAVENOUS

## 2018-01-29 MED ORDER — LIDOCAINE 2% (20 MG/ML) 5 ML SYRINGE
INTRAMUSCULAR | Status: DC | PRN
  Administered 2018-01-29: 100 mg via INTRAVENOUS

## 2018-01-29 MED ORDER — ONDANSETRON HCL 4 MG/2ML IJ SOLN
INTRAMUSCULAR | Status: DC | PRN
  Administered 2018-01-29: 4 mg via INTRAVENOUS

## 2018-01-29 MED ORDER — PROPOFOL 10 MG/ML IV BOLUS
INTRAVENOUS | Status: DC | PRN
  Administered 2018-01-29 (×4): 20 mg via INTRAVENOUS

## 2018-01-29 MED ORDER — LACTATED RINGERS IV SOLN
INTRAVENOUS | Status: DC
  Administered 2018-01-29: 09:00:00 via INTRAVENOUS

## 2018-01-29 SURGICAL SUPPLY — 24 items

## 2018-01-29 NOTE — Anesthesia Preprocedure Evaluation (Signed)
Anesthesia Evaluation  Patient identified by MRN, date of birth, ID band Patient awake    Reviewed: Allergy & Precautions, H&P , NPO status , Patient's Chart, lab work & pertinent test results  Airway Mallampati: II   Neck ROM: full    Dental   Pulmonary  scleroderma   breath sounds clear to auscultation       Cardiovascular + Peripheral Vascular Disease   Rhythm:regular Rate:Normal     Neuro/Psych PSYCHIATRIC DISORDERS Depression  Neuromuscular disease    GI/Hepatic hiatal hernia,   Endo/Other  Hypothyroidism   Renal/GU      Musculoskeletal  (+) Arthritis ,   Abdominal   Peds  Hematology   Anesthesia Other Findings   Reproductive/Obstetrics                             Anesthesia Physical Anesthesia Plan  ASA: III  Anesthesia Plan: MAC   Post-op Pain Management:    Induction: Intravenous  PONV Risk Score and Plan: 2 and Ondansetron, Propofol infusion and Treatment may vary due to age or medical condition  Airway Management Planned: Nasal Cannula  Additional Equipment:   Intra-op Plan:   Post-operative Plan:   Informed Consent: I have reviewed the patients History and Physical, chart, labs and discussed the procedure including the risks, benefits and alternatives for the proposed anesthesia with the patient or authorized representative who has indicated his/her understanding and acceptance.     Plan Discussed with: CRNA, Anesthesiologist and Surgeon  Anesthesia Plan Comments:         Anesthesia Quick Evaluation

## 2018-01-29 NOTE — Anesthesia Procedure Notes (Signed)
Date/Time: 01/29/2018 10:08 AM Performed by: Sharlette Dense, CRNA Oxygen Delivery Method: Nasal cannula

## 2018-01-29 NOTE — H&P (Signed)
HPI: [name] is a 67 year old female with history of scleroderma with systemic sclerosis, breast cancer on tamoxifen, hypothyroidism, hyperlipidemia esophageal dysmotility, history of 1 adenomatous colon polyp in January 2016 on screening colonoscopy who presents for upper endoscopy and colonoscopy.  She is followed by Dr. Manuella Ghazi at [facility].  She has had a mild anemia.  Dr. Manuella Ghazi recommended upper endoscopy to exclude GAVE given association of vascular ectasia and scleroderma.  She also recommended we repeat colonoscopy given association of malignancy with her specific autoantibody and scleroderma.  Tolerated the prep though did have one episode of vomiting this morning.  Reports stool is clear.  No other changes from office visit on 12/22/2017  Past Medical History:  Diagnosis Date  . 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 Colon polyp   . 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 Depression   . Endometrial mass   . Esophageal stricture   . Hiatal hernia   . History of external beam radiation therapy 11-25-2011 to 01-08-2012   left breast 45 Gy at 1.8 per fraction x25 fractions, boost 16 Gy at 2 per fraction x8 fractions  . Hyperlipidemia   . Hypothyroidism   . Osteoarthritis   . Osteopenia   . Positive ANA (antinuclear antibody)   . Rash    arms and legs caused by skin scleroderma  . Raynaud's syndrome   . 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 Ruthann Cancer at [facility]   systemic sclerosis , diffuse/   rheumotologist:  dr Manuella Ghazi at [facility]---  [place] 07-22-2017 (as of 07-25-2017 note not available to read)---  per pt has appt. w/ specialist at [facility]   . Skin thickening    hands, arms, legs  caused by scleroderma  . Systemic sclerosis [facility])     Past Surgical History:  Procedure Laterality Date  . BUNIONECTOMY  2000   Left  . DILATATION & CURETTAGE/HYSTEROSCOPY WITH MYOSURE N/A 08/04/2017   Procedure: DILATATION & CURETTAGE/HYSTEROSCOPY WITH MYOSURE;  Surgeon: Megan Salon, MD;  Location: [facility];  Service: Gynecology;  Laterality: N/A;  . ECTOPIC PREGNANCY SURGERY  1986-88   s/p unilateral salpingectomy  . PARTIAL MASTECTOMY WITH AXILLARY SENTINEL LYMPH NODE BIOPSY Left 11-04-2011   dr Margot Chimes  [facility]  . TRANSTHORACIC ECHOCARDIOGRAM  06-04-2017    [facility]   moderate LVH, ef>55%/  trivial AR and TR/ mild MR and PR/  trivial pericardial effusion  . WISDOM TOOTH EXTRACTION       (Not in an outpatient encounter)  Allergies  Allergen Reactions  . Betadine [Povidone Iodine] Rash  . Contrast Media [Iodinated Diagnostic Agents] Rash    "per pt recently had CT 08/ 2018 even through given pre-medication, IVP still caused rash"  . Epinephrine Other (See Comments)    Sever headaches  . Oxycodone Anxiety  . Nickel Rash and Other (See Comments)    Rash and blisters  . Other Rash and Other (See Comments)    Hospital gown  . Penicillins Rash and Other (See Comments)    Rash and blisters Has patient had a PCN reaction causing immediate rash, facial/tongue/throat swelling, SOB or lightheadedness with hypotension: Yes Has patient  had a PCN reaction causing severe rash involving mucus membranes or skin necrosis: No Has patient had a PCN reaction that required hospitalization: No Has patient had a PCN reaction occurring within the last 10 years: No If all of the above answers are "NO", then may proceed with Cephalosporin use.   . Sulfa Antibiotics Rash    Family History  Problem Relation Age of Onset  . Heart disease Mother   . Heart failure Mother   . Heart disease Father   . Ovarian cancer Maternal Aunt   . Heart  disease Paternal Uncle        multiple  . Autoimmune disease Sister   . Arrhythmia Sister   . Anemia Sister   . CAD Brother     Social History   Tobacco Use  . Smoking status: Never Smoker  . Smokeless tobacco: Never Used  Substance Use Topics  . Alcohol use: Yes    Comment: Drinks one mixed drink nightly  . Drug use: No    ROS: As per history of present illness, otherwise negative  BP 132/75   Pulse 85   Temp 97.8 F (36.6 C) (Oral)   Resp 17   Ht 5' 3.5" (1.613 m)   Wt 142 lb (64.4 kg)   SpO2 100%   BMI 24.76 kg/m  Gen: awake, alert, NAD HEENT: anicteric, op clear CV: RRR, no mrg Pulm: CTA b/l Abd: soft, NT/ND, +BS throughout Ext: no c/c/e Neuro: nonfocal  RELEVANT LABS AND IMAGING: CBC    Component Value Date/Time   WBC 7.7 07/30/2017 1121   RBC 4.03 07/30/2017 1121   HGB 11.8 (L) 07/30/2017 1121   HGB 11.1 (L) 06/09/2017 1117   HCT 37.1 07/30/2017 1121   HCT 34.1 (L) 06/09/2017 1117   PLT 219 07/30/2017 1121   PLT 206 06/09/2017 1117   MCV 92.1 07/30/2017 1121   MCV 93.9 06/09/2017 1117   MCH 29.3 07/30/2017 1121   MCHC 31.8 07/30/2017 1121   RDW 14.2 07/30/2017 1121   RDW 15.9 (H) 06/09/2017 1117   LYMPHSABS 0.6 (L) 06/09/2017 1117   MONOABS 0.6 06/09/2017 1117   EOSABS 0.2 06/09/2017 1117   BASOSABS 0.0 06/09/2017 1117    CMP     Component Value Date/Time   NA 135 07/30/2017 1121   NA 141 06/09/2017 1117   K 4.3 07/30/2017 1121   K 3.9 06/09/2017 1117   CL 103 07/30/2017 1121   CL 109 (H) 12/14/2012 1253   CO2 25 07/30/2017 1121   CO2 23 06/09/2017 1117   GLUCOSE 102 (H) 07/30/2017 1121   GLUCOSE 101 06/09/2017 1117   GLUCOSE 94 12/14/2012 1253   BUN 13 07/30/2017 1121   BUN 11.5 06/09/2017 1117   CREATININE 1.20 (H) 07/30/2017 1121   CREATININE 1.2 (H) 06/09/2017 1117   CALCIUM 8.8 (L) 07/30/2017 1121   CALCIUM 8.9 06/09/2017 1117   PROT 6.3 (L) 07/30/2017 1121   PROT 5.8 (L) 06/09/2017 1117   ALBUMIN 3.9 07/30/2017 1121    ALBUMIN 3.2 (L) 06/09/2017 1117   AST 32 07/30/2017 1121   AST 35 (H) 06/09/2017 1117   ALT 17 07/30/2017 1121   ALT 19 06/09/2017 1117   ALKPHOS 45 07/30/2017 1121   ALKPHOS 40 06/09/2017 1117   BILITOT 0.6 07/30/2017 1121   BILITOT 0.37 06/09/2017 1117   GFRNONAA 45 (L) 07/30/2017 1121   GFRAA 53 (L) 07/30/2017 1121    ASSESSMENT/PLAN: 67 year old female with history of scleroderma with systemic sclerosis,  breast cancer on tamoxifen, hypothyroidism, hyperlipidemia esophageal dysmotility, history of 1 adenomatous colon polyp in January 2016 on screening colonoscopy who presents for upper endoscopy and colonoscopy.  Upper endoscopy today to exclude GAVE given association of this condition and scleroderma also in light of her mild anemia.  Colonoscopy for polyp surveillance as previously discussed.  MAC  The nature of the procedure, as well as the risks, benefits, and alternatives were carefully and thoroughly reviewed with the patient. Ample time for discussion and questions allowed. The patient understood, was satisfied, and agreed to proceed.       Cc:No referring provider defined for this encounter.

## 2018-01-29 NOTE — Discharge Instructions (Signed)
YOU HAD AN ENDOSCOPIC PROCEDURE TODAY: Refer to the procedure report and other information in the discharge instructions given to you for any specific questions about what was found during the examination. If this information does not answer your questions, please call [place] office at [phone] to clarify.  ° °YOU SHOULD EXPECT: Some feelings of bloating in the abdomen. Passage of more gas than usual. Walking can help get rid of the air that was put into your GI tract during the procedure and reduce the bloating. If you had a lower endoscopy (such as a colonoscopy or flexible sigmoidoscopy) you may notice spotting of blood in your stool or on the toilet paper. Some abdominal soreness may be present for a day or two, also. ° °DIET: Your first meal following the procedure should be a light meal and then it is ok to progress to your normal diet. A half-sandwich or bowl of soup is an example of a good first meal. Heavy or fried foods are harder to digest and may make you feel nauseous or bloated. Drink plenty of fluids but you should avoid alcoholic beverages for 24 hours. If you had a esophageal dilation, please see attached instructions for diet.   ° °ACTIVITY: Your care partner should take you home directly after the procedure. You should plan to take it easy, moving slowly for the rest of the day. You can resume normal activity the day after the procedure however YOU SHOULD NOT DRIVE, use power tools, machinery or perform tasks that involve climbing or major physical exertion for 24 hours (because of the sedation medicines used during the test).  ° °SYMPTOMS TO REPORT IMMEDIATELY: °A gastroenterologist can be reached at any hour. Please call [phone]  for any of the following symptoms:  °Following lower endoscopy (colonoscopy, flexible sigmoidoscopy) °Excessive amounts of blood in the stool  °Significant tenderness, worsening of abdominal pains  °Swelling of the abdomen that is new, acute  °Fever of 100° or  higher  °Following upper endoscopy (EGD, EUS, ERCP, esophageal dilation) °Vomiting of blood or coffee ground material  °New, significant abdominal pain  °New, significant chest pain or pain under the shoulder blades  °Painful or persistently difficult swallowing  °New shortness of breath  °Black, tarry-looking or red, bloody stools ° °FOLLOW UP:  °If any biopsies were taken you will be contacted by phone or by letter within the next 1-3 weeks. Call [phone]  if you have not heard about the biopsies in 3 weeks.  °Please also call with any specific questions about appointments or follow up tests. ° °

## 2018-01-29 NOTE — Op Note (Signed)
[facility] Patient Name: [name] Procedure Date: 01/29/2018 MRN: [id number] Attending MD: Jerene Bears , MD Date of Birth: [birth year] CSN: [id number] Age: 67 Admit Type: Outpatient Procedure:                Colonoscopy Indications:              Surveillance: Personal history of adenomatous                            polyps on last colonoscopy 3 years ago Providers:                Lajuan Lines. Hilarie Fredrickson, MD, Elmer Ramp. Tilden Dome, RN, Tinnie Gens, Technician, Danley Danker, CRNA Referring MD:             Marton Redwood; Kendall Flack, MD [facility]) Medicines:                Monitored Anesthesia Care Complications:            No immediate complications. Estimated Blood Loss:     Estimated blood loss: none. Procedure:                Pre-Anesthesia Assessment:                           - Prior to the procedure, a History and Physical                            was performed, and patient medications and                            allergies were reviewed. The patient's tolerance of                            previous anesthesia was also reviewed. The risks                            and benefits of the procedure and the sedation                            options and risks were discussed with the patient.                            All questions were answered, and informed consent                            was obtained. Prior Anticoagulants: The patient has                            taken no previous anticoagulant or antiplatelet                            agents. ASA Grade Assessment: III - A patient with  severe systemic disease. After reviewing the risks                            and benefits, the patient was deemed in                            satisfactory condition to undergo the procedure.                           After obtaining informed consent, the colonoscope                            was passed under  direct vision. Throughout the                            procedure, the patient's blood pressure, pulse, and                            oxygen saturations were monitored continuously. The                            EC-[id number] ([id number]) scope was introduced through                            the anus and advanced to the terminal ileum. The                            colonoscopy was performed without difficulty. The                            patient tolerated the procedure well. The quality                            of the bowel preparation was excellent. The                            terminal ileum, ileocecal valve, appendiceal                            orifice, and rectum were photographed. Scope In: 10:27:17 AM Scope Out: 10:39:57 AM Scope Withdrawal Time: 0 hours 8 minutes 20 seconds  Total Procedure Duration: 0 hours 12 minutes 40 seconds  Findings:      Skin tags were found on perianal exam.      The terminal ileum appeared normal.      The colon (entire examined portion) appeared normal.      Internal hemorrhoids were found during retroflexion and during digital       exam. The hemorrhoids were small. Impression:               - Perianal skin tags found on perianal exam.                           - The examined portion of the ileum was normal.                           -  The entire examined colon is normal.                           - Internal hemorrhoids.                           - No specimens collected. Moderate Sedation:      N/A Recommendation:           - Patient has a contact number available for                            emergencies. The signs and symptoms of potential                            delayed complications were discussed with the                            patient. Return to normal activities tomorrow.                            Written discharge instructions were provided to the                            patient.                           - Resume  previous diet.                           - Continue present medications.                           - Repeat colonoscopy in 5 years for surveillance. Procedure Code(s):        --- Professional ---                           I5027, Colorectal cancer screening; colonoscopy on                            individual at high risk Diagnosis Code(s):        --- Professional ---                           Z86.010, Personal history of colonic polyps                           K64.8, Other hemorrhoids                           K64.4, Residual hemorrhoidal skin tags CPT copyright 2017 American Medical Association. All rights reserved. The codes documented in this report are preliminary and upon coder review may  be revised to meet current compliance requirements. Jerene Bears, MD 01/29/2018 11:04:51 AM This report has been signed electronically. Number of Addenda: 0

## 2018-01-29 NOTE — Op Note (Signed)
[facility] Patient Name: [name] Procedure Date: 01/29/2018 MRN: [id number] Attending MD: Jerene Bears , MD Date of Birth: 01-11-1951 CSN: [id number] Age: 67 Admit Type: Outpatient Procedure:                Upper GI endoscopy Indications:              Scleroderma, mild anemia with normal iron, rule out                            GAVE Providers:                Lajuan Lines. Hilarie Fredrickson, MD, Elmer Ramp. Tilden Dome, RN, Tinnie Gens, Technician, Danley Danker, CRNA Referring MD:             Marton Redwood, MD; Kendall Flack, MD [facility]) Medicines:                Monitored Anesthesia Care Complications:            No immediate complications. Estimated Blood Loss:     Estimated blood loss was minimal. Procedure:                Pre-Anesthesia Assessment:                           - Prior to the procedure, a History and Physical                            was performed, and patient medications and                            allergies were reviewed. The patient's tolerance of                            previous anesthesia was also reviewed. The risks                            and benefits of the procedure and the sedation                            options and risks were discussed with the patient.                            All questions were answered, and informed consent                            was obtained. Prior Anticoagulants: The patient has                            taken no previous anticoagulant or antiplatelet  agents. ASA Grade Assessment: III - A patient with                            severe systemic disease. After reviewing the risks                            and benefits, the patient was deemed in                            satisfactory condition to undergo the procedure.                           After obtaining informed consent, the endoscope was   passed under direct vision. Throughout the                            procedure, the patient's blood pressure, pulse, and                            oxygen saturations were monitored continuously. The                            [id number] ([id number]) scope was introduced through the                            mouth, and advanced to the second part of duodenum.                            The upper GI endoscopy was accomplished without                            difficulty. The patient tolerated the procedure                            well. Scope In: Scope Out: Findings:      LA Grade C (one or more mucosal breaks continuous between tops of 2 or       more mucosal folds, less than 75% circumference) esophagitis with no       bleeding was found 34 to 38 cm from the incisors (low esophagus and GE       junction). Biopsies were taken with a cold forceps for histology.      A 2 cm hiatal hernia was present.      The entire examined stomach was normal. Biopsies were taken with a cold       forceps for histology and Helicobacter pylori testing (given mild       duodenitis).      There is no endoscopic evidence of gastric antral vascular ectasia in       the gastric antrum.      Mild inflammation characterized by erythema was found in the duodenal       bulb.      The second portion of the duodenum was normal. Impression:               - LA Grade C reflux esophagitis. Biopsied.                           -  2 cm hiatal hernia.                           - Normal stomach. Biopsied.                           - Mild duodenitis.                           - Normal second portion of the duodenum. Moderate Sedation:      N/A Recommendation:           - Patient has a contact number available for                            emergencies. The signs and symptoms of potential                            delayed complications were discussed with the                            patient. Return to normal activities  tomorrow.                            Written discharge instructions were provided to the                            patient.                           - Resume previous diet.                           - Continue present medications.                           - Begin pantoprazole 40 mg twice daily before                            breakfast and dinner x 8 weeks, then once daily                            thereafter.                           - Await pathology results. Procedure Code(s):        --- Professional ---                           [phone], Esophagogastroduodenoscopy, flexible,                            transoral; with biopsy, single or multiple Diagnosis Code(s):        --- Professional ---                           K21.0, Gastro-esophageal reflux disease with  esophagitis                           K44.9, Diaphragmatic hernia without obstruction or                            gangrene                           K29.80, Duodenitis without bleeding                           M34.9, Systemic sclerosis, unspecified CPT copyright 2017 American Medical Association. All rights reserved. The codes documented in this report are preliminary and upon coder review may  be revised to meet current compliance requirements. Jerene Bears, MD 01/29/2018 11:01:56 AM This report has been signed electronically. Number of Addenda: 0

## 2018-01-29 NOTE — Transfer of Care (Signed)
Immediate Anesthesia Transfer of Care Note  Patient: [name]  Procedure(s) Performed: ESOPHAGOGASTRODUODENOSCOPY (EGD) WITH PROPOFOL (N/A ) COLONOSCOPY WITH PROPOFOL (N/A )  Patient Location: Endoscopy Unit  Anesthesia Type:MAC  Level of Consciousness: awake, alert  and oriented  Airway & Oxygen Therapy: Patient Spontanous Breathing and Patient connected to nasal cannula oxygen  Post-op Assessment: Report given to RN and Post -op Vital signs reviewed and stable  Post vital signs: Reviewed and stable  Last Vitals:  Vitals Value Taken Time  BP    Temp    Pulse 82 01/29/2018 10:47 AM  Resp 21 01/29/2018 10:47 AM  SpO2 100 % 01/29/2018 10:47 AM  Vitals shown include unvalidated device data.  Last Pain:  Vitals:   01/29/18 0838  TempSrc: Oral  PainSc: 0-No pain         Complications: No apparent anesthesia complications

## 2018-01-30 ENCOUNTER — Encounter (HOSPITAL_COMMUNITY): Payer: Self-pay | Admitting: Internal Medicine

## 2018-01-30 NOTE — Anesthesia Postprocedure Evaluation (Signed)
Anesthesia Post Note  Patient: [name]  Procedure(s) Performed: ESOPHAGOGASTRODUODENOSCOPY (EGD) WITH PROPOFOL (N/A ) COLONOSCOPY WITH PROPOFOL (N/A ) BIOPSY     Patient location during evaluation: PACU Anesthesia Type: MAC Level of consciousness: awake and alert Pain management: pain level controlled Vital Signs Assessment: post-procedure vital signs reviewed and stable Respiratory status: spontaneous breathing, nonlabored ventilation, respiratory function stable and patient connected to nasal cannula oxygen Cardiovascular status: stable and blood pressure returned to baseline Postop Assessment: no apparent nausea or vomiting Anesthetic complications: no    Last Vitals:  Vitals:   01/29/18 1100 01/29/18 1110  BP: (!) 102/42 116/61  Pulse: 81 73  Resp: 14 12  Temp:    SpO2: 100% 99%    Last Pain:  Vitals:   01/29/18 1110  TempSrc:   PainSc: 0-No pain                 [name] S

## 2018-02-02 ENCOUNTER — Encounter: Payer: Self-pay | Admitting: Internal Medicine

## 2018-02-02 DIAGNOSIS — M25641 Stiffness of right hand, not elsewhere classified: Secondary | ICD-10-CM | POA: Diagnosis not present

## 2018-02-02 DIAGNOSIS — R29898 Other symptoms and signs involving the musculoskeletal system: Secondary | ICD-10-CM | POA: Diagnosis not present

## 2018-02-02 DIAGNOSIS — M79646 Pain in unspecified finger(s): Secondary | ICD-10-CM | POA: Diagnosis not present

## 2018-02-02 DIAGNOSIS — M79643 Pain in unspecified hand: Secondary | ICD-10-CM | POA: Diagnosis not present

## 2018-02-02 DIAGNOSIS — M25642 Stiffness of left hand, not elsewhere classified: Secondary | ICD-10-CM | POA: Diagnosis not present

## 2018-02-09 DIAGNOSIS — R29898 Other symptoms and signs involving the musculoskeletal system: Secondary | ICD-10-CM | POA: Diagnosis not present

## 2018-02-09 DIAGNOSIS — M25642 Stiffness of left hand, not elsewhere classified: Secondary | ICD-10-CM | POA: Diagnosis not present

## 2018-02-09 DIAGNOSIS — M79643 Pain in unspecified hand: Secondary | ICD-10-CM | POA: Diagnosis not present

## 2018-02-09 DIAGNOSIS — M25641 Stiffness of right hand, not elsewhere classified: Secondary | ICD-10-CM | POA: Diagnosis not present

## 2018-02-09 DIAGNOSIS — M349 Systemic sclerosis, unspecified: Secondary | ICD-10-CM | POA: Diagnosis not present

## 2018-02-09 DIAGNOSIS — M79646 Pain in unspecified finger(s): Secondary | ICD-10-CM | POA: Diagnosis not present

## 2018-02-17 DIAGNOSIS — M79641 Pain in right hand: Secondary | ICD-10-CM | POA: Diagnosis not present

## 2018-02-17 DIAGNOSIS — I7300 Raynaud's syndrome without gangrene: Secondary | ICD-10-CM | POA: Diagnosis not present

## 2018-02-17 DIAGNOSIS — M79642 Pain in left hand: Secondary | ICD-10-CM | POA: Diagnosis not present

## 2018-02-17 DIAGNOSIS — M349 Systemic sclerosis, unspecified: Secondary | ICD-10-CM | POA: Diagnosis not present

## 2018-02-17 DIAGNOSIS — Z79899 Other long term (current) drug therapy: Secondary | ICD-10-CM | POA: Diagnosis not present

## 2018-02-17 DIAGNOSIS — K210 Gastro-esophageal reflux disease with esophagitis: Secondary | ICD-10-CM | POA: Diagnosis not present

## 2018-02-26 ENCOUNTER — Telehealth: Payer: Self-pay | Admitting: Oncology

## 2018-02-26 NOTE — Telephone Encounter (Signed)
Faxed medical records to [facility] at [phone], Release [id number]

## 2018-03-17 DIAGNOSIS — M79641 Pain in right hand: Secondary | ICD-10-CM | POA: Diagnosis not present

## 2018-03-17 DIAGNOSIS — M25641 Stiffness of right hand, not elsewhere classified: Secondary | ICD-10-CM | POA: Diagnosis not present

## 2018-03-17 DIAGNOSIS — M79642 Pain in left hand: Secondary | ICD-10-CM | POA: Diagnosis not present

## 2018-03-17 DIAGNOSIS — M25642 Stiffness of left hand, not elsewhere classified: Secondary | ICD-10-CM | POA: Diagnosis not present

## 2018-03-17 DIAGNOSIS — M349 Systemic sclerosis, unspecified: Secondary | ICD-10-CM | POA: Diagnosis not present

## 2018-03-17 DIAGNOSIS — T7840XD Allergy, unspecified, subsequent encounter: Secondary | ICD-10-CM | POA: Diagnosis not present

## 2018-03-17 DIAGNOSIS — R29898 Other symptoms and signs involving the musculoskeletal system: Secondary | ICD-10-CM | POA: Diagnosis not present

## 2018-03-23 DIAGNOSIS — M25642 Stiffness of left hand, not elsewhere classified: Secondary | ICD-10-CM | POA: Diagnosis not present

## 2018-03-23 DIAGNOSIS — Z79899 Other long term (current) drug therapy: Secondary | ICD-10-CM | POA: Diagnosis not present

## 2018-03-23 DIAGNOSIS — T7840XD Allergy, unspecified, subsequent encounter: Secondary | ICD-10-CM | POA: Diagnosis not present

## 2018-03-23 DIAGNOSIS — R29898 Other symptoms and signs involving the musculoskeletal system: Secondary | ICD-10-CM | POA: Diagnosis not present

## 2018-03-23 DIAGNOSIS — M79642 Pain in left hand: Secondary | ICD-10-CM | POA: Diagnosis not present

## 2018-03-23 DIAGNOSIS — M79641 Pain in right hand: Secondary | ICD-10-CM | POA: Diagnosis not present

## 2018-03-23 DIAGNOSIS — M25641 Stiffness of right hand, not elsewhere classified: Secondary | ICD-10-CM | POA: Diagnosis not present

## 2018-03-23 DIAGNOSIS — M349 Systemic sclerosis, unspecified: Secondary | ICD-10-CM | POA: Diagnosis not present

## 2018-03-30 DIAGNOSIS — R29898 Other symptoms and signs involving the musculoskeletal system: Secondary | ICD-10-CM | POA: Diagnosis not present

## 2018-03-30 DIAGNOSIS — M349 Systemic sclerosis, unspecified: Secondary | ICD-10-CM | POA: Diagnosis not present

## 2018-03-30 DIAGNOSIS — M25642 Stiffness of left hand, not elsewhere classified: Secondary | ICD-10-CM | POA: Diagnosis not present

## 2018-03-30 DIAGNOSIS — M79642 Pain in left hand: Secondary | ICD-10-CM | POA: Diagnosis not present

## 2018-03-30 DIAGNOSIS — M79641 Pain in right hand: Secondary | ICD-10-CM | POA: Diagnosis not present

## 2018-03-30 DIAGNOSIS — T7840XD Allergy, unspecified, subsequent encounter: Secondary | ICD-10-CM | POA: Diagnosis not present

## 2018-03-30 DIAGNOSIS — M25641 Stiffness of right hand, not elsewhere classified: Secondary | ICD-10-CM | POA: Diagnosis not present

## 2018-04-07 DIAGNOSIS — M349 Systemic sclerosis, unspecified: Secondary | ICD-10-CM | POA: Diagnosis not present

## 2018-04-07 DIAGNOSIS — M79642 Pain in left hand: Secondary | ICD-10-CM | POA: Diagnosis not present

## 2018-04-07 DIAGNOSIS — M25641 Stiffness of right hand, not elsewhere classified: Secondary | ICD-10-CM | POA: Diagnosis not present

## 2018-04-07 DIAGNOSIS — M25642 Stiffness of left hand, not elsewhere classified: Secondary | ICD-10-CM | POA: Diagnosis not present

## 2018-04-07 DIAGNOSIS — R29898 Other symptoms and signs involving the musculoskeletal system: Secondary | ICD-10-CM | POA: Diagnosis not present

## 2018-04-07 DIAGNOSIS — T7840XD Allergy, unspecified, subsequent encounter: Secondary | ICD-10-CM | POA: Diagnosis not present

## 2018-04-07 DIAGNOSIS — M79641 Pain in right hand: Secondary | ICD-10-CM | POA: Diagnosis not present

## 2018-04-09 DIAGNOSIS — L812 Freckles: Secondary | ICD-10-CM | POA: Diagnosis not present

## 2018-04-09 DIAGNOSIS — L820 Inflamed seborrheic keratosis: Secondary | ICD-10-CM | POA: Diagnosis not present

## 2018-04-09 DIAGNOSIS — L821 Other seborrheic keratosis: Secondary | ICD-10-CM | POA: Diagnosis not present

## 2018-04-09 DIAGNOSIS — Z85828 Personal history of other malignant neoplasm of skin: Secondary | ICD-10-CM | POA: Diagnosis not present

## 2018-04-14 DIAGNOSIS — M349 Systemic sclerosis, unspecified: Secondary | ICD-10-CM | POA: Diagnosis not present

## 2018-04-14 DIAGNOSIS — T7840XD Allergy, unspecified, subsequent encounter: Secondary | ICD-10-CM | POA: Diagnosis not present

## 2018-04-14 DIAGNOSIS — M79642 Pain in left hand: Secondary | ICD-10-CM | POA: Diagnosis not present

## 2018-04-14 DIAGNOSIS — R29898 Other symptoms and signs involving the musculoskeletal system: Secondary | ICD-10-CM | POA: Diagnosis not present

## 2018-04-14 DIAGNOSIS — M25641 Stiffness of right hand, not elsewhere classified: Secondary | ICD-10-CM | POA: Diagnosis not present

## 2018-04-14 DIAGNOSIS — M25642 Stiffness of left hand, not elsewhere classified: Secondary | ICD-10-CM | POA: Diagnosis not present

## 2018-04-14 DIAGNOSIS — M79641 Pain in right hand: Secondary | ICD-10-CM | POA: Diagnosis not present

## 2018-04-17 DIAGNOSIS — T7840XD Allergy, unspecified, subsequent encounter: Secondary | ICD-10-CM | POA: Diagnosis not present

## 2018-04-17 DIAGNOSIS — M349 Systemic sclerosis, unspecified: Secondary | ICD-10-CM | POA: Diagnosis not present

## 2018-04-17 DIAGNOSIS — M25642 Stiffness of left hand, not elsewhere classified: Secondary | ICD-10-CM | POA: Diagnosis not present

## 2018-04-17 DIAGNOSIS — M79642 Pain in left hand: Secondary | ICD-10-CM | POA: Diagnosis not present

## 2018-04-17 DIAGNOSIS — R29898 Other symptoms and signs involving the musculoskeletal system: Secondary | ICD-10-CM | POA: Diagnosis not present

## 2018-04-17 DIAGNOSIS — M25641 Stiffness of right hand, not elsewhere classified: Secondary | ICD-10-CM | POA: Diagnosis not present

## 2018-04-17 DIAGNOSIS — M79641 Pain in right hand: Secondary | ICD-10-CM | POA: Diagnosis not present

## 2018-04-20 DIAGNOSIS — R29898 Other symptoms and signs involving the musculoskeletal system: Secondary | ICD-10-CM | POA: Diagnosis not present

## 2018-04-20 DIAGNOSIS — M25642 Stiffness of left hand, not elsewhere classified: Secondary | ICD-10-CM | POA: Diagnosis not present

## 2018-04-20 DIAGNOSIS — T7840XD Allergy, unspecified, subsequent encounter: Secondary | ICD-10-CM | POA: Diagnosis not present

## 2018-04-20 DIAGNOSIS — M79641 Pain in right hand: Secondary | ICD-10-CM | POA: Diagnosis not present

## 2018-04-20 DIAGNOSIS — M79642 Pain in left hand: Secondary | ICD-10-CM | POA: Diagnosis not present

## 2018-04-20 DIAGNOSIS — M349 Systemic sclerosis, unspecified: Secondary | ICD-10-CM | POA: Diagnosis not present

## 2018-04-20 DIAGNOSIS — M25641 Stiffness of right hand, not elsewhere classified: Secondary | ICD-10-CM | POA: Diagnosis not present

## 2018-04-22 DIAGNOSIS — M349 Systemic sclerosis, unspecified: Secondary | ICD-10-CM | POA: Diagnosis not present

## 2018-04-22 DIAGNOSIS — M79642 Pain in left hand: Secondary | ICD-10-CM | POA: Diagnosis not present

## 2018-04-22 DIAGNOSIS — M79641 Pain in right hand: Secondary | ICD-10-CM | POA: Diagnosis not present

## 2018-04-22 DIAGNOSIS — R29898 Other symptoms and signs involving the musculoskeletal system: Secondary | ICD-10-CM | POA: Diagnosis not present

## 2018-04-22 DIAGNOSIS — M25641 Stiffness of right hand, not elsewhere classified: Secondary | ICD-10-CM | POA: Diagnosis not present

## 2018-04-22 DIAGNOSIS — M25642 Stiffness of left hand, not elsewhere classified: Secondary | ICD-10-CM | POA: Diagnosis not present

## 2018-04-22 DIAGNOSIS — T7840XD Allergy, unspecified, subsequent encounter: Secondary | ICD-10-CM | POA: Diagnosis not present

## 2018-04-27 DIAGNOSIS — M79642 Pain in left hand: Secondary | ICD-10-CM | POA: Diagnosis not present

## 2018-04-27 DIAGNOSIS — T7840XD Allergy, unspecified, subsequent encounter: Secondary | ICD-10-CM | POA: Diagnosis not present

## 2018-04-27 DIAGNOSIS — M25641 Stiffness of right hand, not elsewhere classified: Secondary | ICD-10-CM | POA: Diagnosis not present

## 2018-04-27 DIAGNOSIS — R29898 Other symptoms and signs involving the musculoskeletal system: Secondary | ICD-10-CM | POA: Diagnosis not present

## 2018-04-27 DIAGNOSIS — M349 Systemic sclerosis, unspecified: Secondary | ICD-10-CM | POA: Diagnosis not present

## 2018-04-27 DIAGNOSIS — M25642 Stiffness of left hand, not elsewhere classified: Secondary | ICD-10-CM | POA: Diagnosis not present

## 2018-04-27 DIAGNOSIS — M79641 Pain in right hand: Secondary | ICD-10-CM | POA: Diagnosis not present

## 2018-04-29 DIAGNOSIS — M25642 Stiffness of left hand, not elsewhere classified: Secondary | ICD-10-CM | POA: Diagnosis not present

## 2018-04-29 DIAGNOSIS — M79641 Pain in right hand: Secondary | ICD-10-CM | POA: Diagnosis not present

## 2018-04-29 DIAGNOSIS — M349 Systemic sclerosis, unspecified: Secondary | ICD-10-CM | POA: Diagnosis not present

## 2018-04-29 DIAGNOSIS — R29898 Other symptoms and signs involving the musculoskeletal system: Secondary | ICD-10-CM | POA: Diagnosis not present

## 2018-04-29 DIAGNOSIS — M25641 Stiffness of right hand, not elsewhere classified: Secondary | ICD-10-CM | POA: Diagnosis not present

## 2018-04-29 DIAGNOSIS — T7840XD Allergy, unspecified, subsequent encounter: Secondary | ICD-10-CM | POA: Diagnosis not present

## 2018-04-29 DIAGNOSIS — M79642 Pain in left hand: Secondary | ICD-10-CM | POA: Diagnosis not present

## 2018-05-03 DIAGNOSIS — J029 Acute pharyngitis, unspecified: Secondary | ICD-10-CM | POA: Diagnosis not present

## 2018-05-03 DIAGNOSIS — J1089 Influenza due to other identified influenza virus with other manifestations: Secondary | ICD-10-CM | POA: Diagnosis not present

## 2018-05-05 ENCOUNTER — Other Ambulatory Visit: Payer: Self-pay | Admitting: Oncology

## 2018-05-21 ENCOUNTER — Encounter: Payer: Self-pay | Admitting: *Deleted

## 2018-05-26 ENCOUNTER — Ambulatory Visit (INDEPENDENT_AMBULATORY_CARE_PROVIDER_SITE_OTHER): Payer: Medicare Other | Admitting: Internal Medicine

## 2018-05-26 ENCOUNTER — Other Ambulatory Visit: Payer: Self-pay | Admitting: Internal Medicine

## 2018-05-26 ENCOUNTER — Encounter: Payer: Self-pay | Admitting: Internal Medicine

## 2018-05-26 VITALS — BP 128/72 | HR 68 | Ht 64.0 in | Wt 150.0 lb

## 2018-05-26 DIAGNOSIS — K2100 Gastro-esophageal reflux disease with esophagitis, without bleeding: Secondary | ICD-10-CM

## 2018-05-26 MED ORDER — PANTOPRAZOLE SODIUM 40 MG PO TBEC: 40.0000 mg | DELAYED_RELEASE_TABLET | Freq: Two times a day (BID) | ORAL | 0 refills | Status: DC

## 2018-05-26 NOTE — Progress Notes (Signed)
Subjective:    Patient ID: [name], female    DOB: [birth year], 67 y.o.   MRN: [id number]  HPI [name] is a 67 year old female with a past medical history of scleroderma with systemic sclerosis, breast cancer on tamoxifen, hypothyroidism, hyperlipidemia, history of colon polyps, GERD with reflux esophagitis who is here for follow-up.  She is here today with her husband.  She was last seen in the office on 12/22/2017 and for upper endoscopy on 01/29/2018.  We initially performed endoscopy to rule out GAVE given association of this condition with her scleroderma.  No GAVE was found that she did have LA grade C reflux esophagitis, a 2 cm hiatal hernia.  Gastric biopsies were negative for H. pylori.  Esophageal biopsies were negative for dysplasia.  She was started on pantoprazole 40 mg twice daily which she has taken.  She reports she wishes to discuss the need for chronic PPI therapy because she cannot tell much improvement or change in symptoms with starting this medication.  She definitely feels less burping and less gas though she was having no heartburn, dysphagia or odynophagia previously.  No heartburn history.  No abdominal pain.  Bowel movements have remained regular.  She was diagnosed with influenza A 3 weeks ago and has been slowly improving.  She has no shortness of breath.  She has had some postnasal drainage and mild raspy voice since influenza.  She is recently started elderberry which is helped with some of the inflammatory symptoms in her hands, particular joint stiffness and tightness.  Has follow-up with her rheumatologist at [facility] later this month and at [facility] in 3 months.   Review of Systems As per HPI, otherwise negative  Current Medications, Allergies, Past Medical History, Past Surgical History, Family History and Social History were reviewed in [employer] record.     Objective:   Physical Exam BP 128/72   Pulse 68   Ht  5\' 4"  (1.626 m)   Wt 150 lb (68 kg)   BMI 25.75 kg/m  Constitutional: Well-developed and well-nourished. No distress. HEENT: Normocephalic and atraumatic. Conjunctivae are normal.  No scleral icterus. Neck: Neck supple. Trachea midline. Cardiovascular: Normal rate, regular rhythm and intact distal pulses. No M/R/G Pulmonary/chest: Effort normal and breath sounds normal. No wheezing, rales or rhonchi. Abdominal: Soft, nontender, nondistended. Bowel sounds active throughout. There are no masses palpable. No hepatosplenomegaly. Extremities: no clubbing, cyanosis, or edema Neurological: Alert and oriented to person place and time. Psychiatric: Normal mood and affect. Behavior is normal.      Assessment & Plan:   67 year old female with a past medical history of scleroderma with systemic sclerosis, breast cancer on tamoxifen, hypothyroidism, hyperlipidemia, history of colon polyps, GERD with reflux esophagitis who is here for follow-up.  1. GERD with hx of esophagitis --fortunately for her she does not have subjective reflux symptoms but certainly had objective evidence of moderate to severe reflux by her level of esophagitis found endoscopy in July 2019.  She has reduced the pantoprazole 40 mg once per day and is not having any alarm symptom.  We discussed how if her esophagitis has healed and does not recur then she may not need chronic acid suppression therapy. After our discussion we decided to have her stop PPI altogether on 06/14/2018.  She will spend 1 month off therapy and determine if she has any subjective reflux symptoms.  We discussed how she may find being off the medicine that there were symptoms being  controlled that she was previously unaware of.  Should this be the case we would likely resume once daily PPI.  If no recurrent reflux symptoms then I would recommend in February 2020 we repeat upper endoscopy to rule out recurrent esophagitis.  If no recurrent esophagitis that she would  likely remain off PPI therapy but understands that if she has recurrent esophagitis chronic PPI therapy would be recommended.  2.  CRC screening --normal colonoscopy in July.  Repeat in 5 years, July 2024  3.  Scleroderma with systemic sclerosis --following with rheumatology at [facility]  25 minutes spent with the patient today. Greater than 50% was spent in counseling and coordination of care with the patient

## 2018-05-26 NOTE — Patient Instructions (Signed)
We have sent the following medications to your pharmacy for you to pick up at your convenience: Pantoprazole 40 mg daily-- until 06-14-18. After this date, try to subjectively determine if you still need this medication.   You will be due for a recall endoscopy in 08/2018. We will send you a reminder in the mail when it gets closer to that time.  If you are age 68 or older, your body mass index should be between 23-30. Your Body mass index is 25.75 kg/m. If this is out of the aforementioned range listed, please consider follow up with your Primary Care Provider.  If you are age 75 or younger, your body mass index should be between 19-25. Your Body mass index is 25.75 kg/m. If this is out of the aformentioned range listed, please consider follow up with your Primary Care Provider.

## 2018-06-01 DIAGNOSIS — M79642 Pain in left hand: Secondary | ICD-10-CM | POA: Diagnosis not present

## 2018-06-01 DIAGNOSIS — M79641 Pain in right hand: Secondary | ICD-10-CM | POA: Diagnosis not present

## 2018-06-01 DIAGNOSIS — M25641 Stiffness of right hand, not elsewhere classified: Secondary | ICD-10-CM | POA: Diagnosis not present

## 2018-06-01 DIAGNOSIS — R29898 Other symptoms and signs involving the musculoskeletal system: Secondary | ICD-10-CM | POA: Diagnosis not present

## 2018-06-01 DIAGNOSIS — M349 Systemic sclerosis, unspecified: Secondary | ICD-10-CM | POA: Diagnosis not present

## 2018-06-01 DIAGNOSIS — T7840XD Allergy, unspecified, subsequent encounter: Secondary | ICD-10-CM | POA: Diagnosis not present

## 2018-06-01 DIAGNOSIS — M25642 Stiffness of left hand, not elsewhere classified: Secondary | ICD-10-CM | POA: Diagnosis not present

## 2018-06-02 DIAGNOSIS — I7300 Raynaud's syndrome without gangrene: Secondary | ICD-10-CM | POA: Diagnosis not present

## 2018-06-02 DIAGNOSIS — Z79899 Other long term (current) drug therapy: Secondary | ICD-10-CM | POA: Diagnosis not present

## 2018-06-02 DIAGNOSIS — M349 Systemic sclerosis, unspecified: Secondary | ICD-10-CM | POA: Diagnosis not present

## 2018-06-05 DIAGNOSIS — R29898 Other symptoms and signs involving the musculoskeletal system: Secondary | ICD-10-CM | POA: Diagnosis not present

## 2018-06-05 DIAGNOSIS — M79642 Pain in left hand: Secondary | ICD-10-CM | POA: Diagnosis not present

## 2018-06-05 DIAGNOSIS — M25642 Stiffness of left hand, not elsewhere classified: Secondary | ICD-10-CM | POA: Diagnosis not present

## 2018-06-05 DIAGNOSIS — M79641 Pain in right hand: Secondary | ICD-10-CM | POA: Diagnosis not present

## 2018-06-05 DIAGNOSIS — M25641 Stiffness of right hand, not elsewhere classified: Secondary | ICD-10-CM | POA: Diagnosis not present

## 2018-06-05 DIAGNOSIS — M349 Systemic sclerosis, unspecified: Secondary | ICD-10-CM | POA: Diagnosis not present

## 2018-06-05 DIAGNOSIS — T7840XD Allergy, unspecified, subsequent encounter: Secondary | ICD-10-CM | POA: Diagnosis not present

## 2018-06-29 DIAGNOSIS — M79641 Pain in right hand: Secondary | ICD-10-CM | POA: Diagnosis not present

## 2018-06-29 DIAGNOSIS — M349 Systemic sclerosis, unspecified: Secondary | ICD-10-CM | POA: Diagnosis not present

## 2018-06-29 DIAGNOSIS — M25642 Stiffness of left hand, not elsewhere classified: Secondary | ICD-10-CM | POA: Diagnosis not present

## 2018-06-29 DIAGNOSIS — M79642 Pain in left hand: Secondary | ICD-10-CM | POA: Diagnosis not present

## 2018-06-29 DIAGNOSIS — T7840XD Allergy, unspecified, subsequent encounter: Secondary | ICD-10-CM | POA: Diagnosis not present

## 2018-06-29 DIAGNOSIS — R29898 Other symptoms and signs involving the musculoskeletal system: Secondary | ICD-10-CM | POA: Diagnosis not present

## 2018-06-29 DIAGNOSIS — M25641 Stiffness of right hand, not elsewhere classified: Secondary | ICD-10-CM | POA: Diagnosis not present

## 2018-07-03 DIAGNOSIS — M79642 Pain in left hand: Secondary | ICD-10-CM | POA: Diagnosis not present

## 2018-07-03 DIAGNOSIS — M349 Systemic sclerosis, unspecified: Secondary | ICD-10-CM | POA: Diagnosis not present

## 2018-07-03 DIAGNOSIS — M25642 Stiffness of left hand, not elsewhere classified: Secondary | ICD-10-CM | POA: Diagnosis not present

## 2018-07-03 DIAGNOSIS — T7840XD Allergy, unspecified, subsequent encounter: Secondary | ICD-10-CM | POA: Diagnosis not present

## 2018-07-03 DIAGNOSIS — Z79899 Other long term (current) drug therapy: Secondary | ICD-10-CM | POA: Diagnosis not present

## 2018-07-03 DIAGNOSIS — R29898 Other symptoms and signs involving the musculoskeletal system: Secondary | ICD-10-CM | POA: Diagnosis not present

## 2018-07-03 DIAGNOSIS — M25641 Stiffness of right hand, not elsewhere classified: Secondary | ICD-10-CM | POA: Diagnosis not present

## 2018-07-03 DIAGNOSIS — M79641 Pain in right hand: Secondary | ICD-10-CM | POA: Diagnosis not present

## 2018-07-05 ENCOUNTER — Other Ambulatory Visit: Payer: Self-pay | Admitting: Internal Medicine

## 2018-07-11 ENCOUNTER — Other Ambulatory Visit: Payer: Self-pay | Admitting: Oncology

## 2018-07-13 DIAGNOSIS — M25641 Stiffness of right hand, not elsewhere classified: Secondary | ICD-10-CM | POA: Diagnosis not present

## 2018-07-13 DIAGNOSIS — M79642 Pain in left hand: Secondary | ICD-10-CM | POA: Diagnosis not present

## 2018-07-13 DIAGNOSIS — M25642 Stiffness of left hand, not elsewhere classified: Secondary | ICD-10-CM | POA: Diagnosis not present

## 2018-07-13 DIAGNOSIS — R29898 Other symptoms and signs involving the musculoskeletal system: Secondary | ICD-10-CM | POA: Diagnosis not present

## 2018-07-13 DIAGNOSIS — M79641 Pain in right hand: Secondary | ICD-10-CM | POA: Diagnosis not present

## 2018-07-13 DIAGNOSIS — T7840XD Allergy, unspecified, subsequent encounter: Secondary | ICD-10-CM | POA: Diagnosis not present

## 2018-07-13 DIAGNOSIS — M349 Systemic sclerosis, unspecified: Secondary | ICD-10-CM | POA: Diagnosis not present

## 2018-07-20 DIAGNOSIS — R29898 Other symptoms and signs involving the musculoskeletal system: Secondary | ICD-10-CM | POA: Diagnosis not present

## 2018-07-20 DIAGNOSIS — M79642 Pain in left hand: Secondary | ICD-10-CM | POA: Diagnosis not present

## 2018-07-20 DIAGNOSIS — M349 Systemic sclerosis, unspecified: Secondary | ICD-10-CM | POA: Diagnosis not present

## 2018-07-20 DIAGNOSIS — T7840XD Allergy, unspecified, subsequent encounter: Secondary | ICD-10-CM | POA: Diagnosis not present

## 2018-07-20 DIAGNOSIS — M79641 Pain in right hand: Secondary | ICD-10-CM | POA: Diagnosis not present

## 2018-07-20 DIAGNOSIS — M25642 Stiffness of left hand, not elsewhere classified: Secondary | ICD-10-CM | POA: Diagnosis not present

## 2018-07-20 DIAGNOSIS — M25641 Stiffness of right hand, not elsewhere classified: Secondary | ICD-10-CM | POA: Diagnosis not present

## 2018-07-21 DIAGNOSIS — J849 Interstitial pulmonary disease, unspecified: Secondary | ICD-10-CM | POA: Diagnosis not present

## 2018-07-21 DIAGNOSIS — M349 Systemic sclerosis, unspecified: Secondary | ICD-10-CM | POA: Diagnosis not present

## 2018-07-23 DIAGNOSIS — I10 Essential (primary) hypertension: Secondary | ICD-10-CM | POA: Diagnosis not present

## 2018-07-23 DIAGNOSIS — H25813 Combined forms of age-related cataract, bilateral: Secondary | ICD-10-CM | POA: Diagnosis not present

## 2018-07-23 DIAGNOSIS — H52223 Regular astigmatism, bilateral: Secondary | ICD-10-CM | POA: Diagnosis not present

## 2018-07-23 DIAGNOSIS — H35033 Hypertensive retinopathy, bilateral: Secondary | ICD-10-CM | POA: Diagnosis not present

## 2018-07-23 DIAGNOSIS — H5203 Hypermetropia, bilateral: Secondary | ICD-10-CM | POA: Diagnosis not present

## 2018-07-24 DIAGNOSIS — M79641 Pain in right hand: Secondary | ICD-10-CM | POA: Diagnosis not present

## 2018-07-24 DIAGNOSIS — R29898 Other symptoms and signs involving the musculoskeletal system: Secondary | ICD-10-CM | POA: Diagnosis not present

## 2018-07-24 DIAGNOSIS — T7840XD Allergy, unspecified, subsequent encounter: Secondary | ICD-10-CM | POA: Diagnosis not present

## 2018-07-24 DIAGNOSIS — M25641 Stiffness of right hand, not elsewhere classified: Secondary | ICD-10-CM | POA: Diagnosis not present

## 2018-07-24 DIAGNOSIS — M25642 Stiffness of left hand, not elsewhere classified: Secondary | ICD-10-CM | POA: Diagnosis not present

## 2018-07-24 DIAGNOSIS — M349 Systemic sclerosis, unspecified: Secondary | ICD-10-CM | POA: Diagnosis not present

## 2018-07-24 DIAGNOSIS — M79642 Pain in left hand: Secondary | ICD-10-CM | POA: Diagnosis not present

## 2018-07-27 DIAGNOSIS — M79641 Pain in right hand: Secondary | ICD-10-CM | POA: Diagnosis not present

## 2018-07-27 DIAGNOSIS — T7840XD Allergy, unspecified, subsequent encounter: Secondary | ICD-10-CM | POA: Diagnosis not present

## 2018-07-27 DIAGNOSIS — M25641 Stiffness of right hand, not elsewhere classified: Secondary | ICD-10-CM | POA: Diagnosis not present

## 2018-07-27 DIAGNOSIS — M79642 Pain in left hand: Secondary | ICD-10-CM | POA: Diagnosis not present

## 2018-07-27 DIAGNOSIS — R29898 Other symptoms and signs involving the musculoskeletal system: Secondary | ICD-10-CM | POA: Diagnosis not present

## 2018-07-27 DIAGNOSIS — M349 Systemic sclerosis, unspecified: Secondary | ICD-10-CM | POA: Diagnosis not present

## 2018-07-27 DIAGNOSIS — M25642 Stiffness of left hand, not elsewhere classified: Secondary | ICD-10-CM | POA: Diagnosis not present

## 2018-07-28 ENCOUNTER — Encounter: Payer: Self-pay | Admitting: Internal Medicine

## 2018-07-29 DIAGNOSIS — R29898 Other symptoms and signs involving the musculoskeletal system: Secondary | ICD-10-CM | POA: Diagnosis not present

## 2018-07-29 DIAGNOSIS — M349 Systemic sclerosis, unspecified: Secondary | ICD-10-CM | POA: Diagnosis not present

## 2018-07-29 DIAGNOSIS — M25641 Stiffness of right hand, not elsewhere classified: Secondary | ICD-10-CM | POA: Diagnosis not present

## 2018-07-29 DIAGNOSIS — M79642 Pain in left hand: Secondary | ICD-10-CM | POA: Diagnosis not present

## 2018-07-29 DIAGNOSIS — T7840XD Allergy, unspecified, subsequent encounter: Secondary | ICD-10-CM | POA: Diagnosis not present

## 2018-07-29 DIAGNOSIS — M79641 Pain in right hand: Secondary | ICD-10-CM | POA: Diagnosis not present

## 2018-07-29 DIAGNOSIS — M25642 Stiffness of left hand, not elsewhere classified: Secondary | ICD-10-CM | POA: Diagnosis not present

## 2018-08-03 ENCOUNTER — Other Ambulatory Visit: Payer: Self-pay | Admitting: Internal Medicine

## 2018-08-04 DIAGNOSIS — R29898 Other symptoms and signs involving the musculoskeletal system: Secondary | ICD-10-CM | POA: Diagnosis not present

## 2018-08-04 DIAGNOSIS — M79642 Pain in left hand: Secondary | ICD-10-CM | POA: Diagnosis not present

## 2018-08-04 DIAGNOSIS — T7840XD Allergy, unspecified, subsequent encounter: Secondary | ICD-10-CM | POA: Diagnosis not present

## 2018-08-04 DIAGNOSIS — M25642 Stiffness of left hand, not elsewhere classified: Secondary | ICD-10-CM | POA: Diagnosis not present

## 2018-08-04 DIAGNOSIS — M25641 Stiffness of right hand, not elsewhere classified: Secondary | ICD-10-CM | POA: Diagnosis not present

## 2018-08-04 DIAGNOSIS — M79641 Pain in right hand: Secondary | ICD-10-CM | POA: Diagnosis not present

## 2018-08-04 DIAGNOSIS — M349 Systemic sclerosis, unspecified: Secondary | ICD-10-CM | POA: Diagnosis not present

## 2018-08-10 DIAGNOSIS — T7840XD Allergy, unspecified, subsequent encounter: Secondary | ICD-10-CM | POA: Diagnosis not present

## 2018-08-10 DIAGNOSIS — M25642 Stiffness of left hand, not elsewhere classified: Secondary | ICD-10-CM | POA: Diagnosis not present

## 2018-08-10 DIAGNOSIS — M79642 Pain in left hand: Secondary | ICD-10-CM | POA: Diagnosis not present

## 2018-08-10 DIAGNOSIS — M25641 Stiffness of right hand, not elsewhere classified: Secondary | ICD-10-CM | POA: Diagnosis not present

## 2018-08-10 DIAGNOSIS — R29898 Other symptoms and signs involving the musculoskeletal system: Secondary | ICD-10-CM | POA: Diagnosis not present

## 2018-08-10 DIAGNOSIS — M349 Systemic sclerosis, unspecified: Secondary | ICD-10-CM | POA: Diagnosis not present

## 2018-08-10 DIAGNOSIS — M79641 Pain in right hand: Secondary | ICD-10-CM | POA: Diagnosis not present

## 2018-08-14 DIAGNOSIS — M79642 Pain in left hand: Secondary | ICD-10-CM | POA: Diagnosis not present

## 2018-08-14 DIAGNOSIS — T7840XD Allergy, unspecified, subsequent encounter: Secondary | ICD-10-CM | POA: Diagnosis not present

## 2018-08-14 DIAGNOSIS — R29898 Other symptoms and signs involving the musculoskeletal system: Secondary | ICD-10-CM | POA: Diagnosis not present

## 2018-08-14 DIAGNOSIS — M79641 Pain in right hand: Secondary | ICD-10-CM | POA: Diagnosis not present

## 2018-08-14 DIAGNOSIS — M25642 Stiffness of left hand, not elsewhere classified: Secondary | ICD-10-CM | POA: Diagnosis not present

## 2018-08-14 DIAGNOSIS — M349 Systemic sclerosis, unspecified: Secondary | ICD-10-CM | POA: Diagnosis not present

## 2018-08-14 DIAGNOSIS — M25641 Stiffness of right hand, not elsewhere classified: Secondary | ICD-10-CM | POA: Diagnosis not present

## 2018-08-15 ENCOUNTER — Ambulatory Visit (INDEPENDENT_AMBULATORY_CARE_PROVIDER_SITE_OTHER): Payer: Medicare Other | Admitting: Podiatry

## 2018-08-15 ENCOUNTER — Encounter: Payer: Self-pay | Admitting: Podiatry

## 2018-08-15 ENCOUNTER — Ambulatory Visit (INDEPENDENT_AMBULATORY_CARE_PROVIDER_SITE_OTHER): Payer: Medicare Other

## 2018-08-15 DIAGNOSIS — M722 Plantar fascial fibromatosis: Secondary | ICD-10-CM | POA: Diagnosis not present

## 2018-08-15 MED ORDER — TRIAMCINOLONE ACETONIDE 10 MG/ML IJ SUSP
10.0000 mg | Freq: Once | INTRAMUSCULAR | Status: AC
  Administered 2018-08-15: 10 mg

## 2018-08-15 NOTE — Patient Instructions (Signed)

## 2018-08-17 DIAGNOSIS — M79642 Pain in left hand: Secondary | ICD-10-CM | POA: Diagnosis not present

## 2018-08-17 DIAGNOSIS — R29898 Other symptoms and signs involving the musculoskeletal system: Secondary | ICD-10-CM | POA: Diagnosis not present

## 2018-08-17 DIAGNOSIS — M349 Systemic sclerosis, unspecified: Secondary | ICD-10-CM | POA: Diagnosis not present

## 2018-08-17 DIAGNOSIS — T7840XD Allergy, unspecified, subsequent encounter: Secondary | ICD-10-CM | POA: Diagnosis not present

## 2018-08-17 DIAGNOSIS — M79641 Pain in right hand: Secondary | ICD-10-CM | POA: Diagnosis not present

## 2018-08-17 DIAGNOSIS — M25641 Stiffness of right hand, not elsewhere classified: Secondary | ICD-10-CM | POA: Diagnosis not present

## 2018-08-17 DIAGNOSIS — M25642 Stiffness of left hand, not elsewhere classified: Secondary | ICD-10-CM | POA: Diagnosis not present

## 2018-08-19 DIAGNOSIS — M25642 Stiffness of left hand, not elsewhere classified: Secondary | ICD-10-CM | POA: Diagnosis not present

## 2018-08-19 DIAGNOSIS — M25641 Stiffness of right hand, not elsewhere classified: Secondary | ICD-10-CM | POA: Diagnosis not present

## 2018-08-19 DIAGNOSIS — T7840XD Allergy, unspecified, subsequent encounter: Secondary | ICD-10-CM | POA: Diagnosis not present

## 2018-08-19 DIAGNOSIS — R29898 Other symptoms and signs involving the musculoskeletal system: Secondary | ICD-10-CM | POA: Diagnosis not present

## 2018-08-19 DIAGNOSIS — M79641 Pain in right hand: Secondary | ICD-10-CM | POA: Diagnosis not present

## 2018-08-19 DIAGNOSIS — M349 Systemic sclerosis, unspecified: Secondary | ICD-10-CM | POA: Diagnosis not present

## 2018-08-19 DIAGNOSIS — M79642 Pain in left hand: Secondary | ICD-10-CM | POA: Diagnosis not present

## 2018-08-24 NOTE — Progress Notes (Signed)
Subjective:   Patient ID: [name], female   DOB: 69 y.o.   MRN: [id number]   HPI Patient presents stating that both heels are really bothering her and making it hard to be active and walk comfortably.  States is worse when she gets up in the mornings and after periods of sitting   Review of Systems  All other systems reviewed and are negative.       Objective:  Physical Exam Vitals signs and nursing note reviewed.  Constitutional:      Appearance: She is well-developed.  Pulmonary:     Effort: Pulmonary effort is normal.  Musculoskeletal: Normal range of motion.  Skin:    General: Skin is warm.  Neurological:     Mental Status: She is alert.     Neurovascular status intact muscle strength is adequate range of motion within normal limits with patient found to have exquisite discomfort plantar aspect heel region bilateral with inflammation fluid of the medial block at the insertional point tendon into the calcaneus with fluid buildup and moderate depression of the arch noted.  Patient has good digital perfusion well oriented x3    Assessment:  Acute plantar fasciitis bilateral heel with inflammation fluid buildup     Plan:  H&P x-rays reviewed today I injected the plantar fascia bilateral 3 mg Kenalog 5 mg Xylocaine applied fascial brace with good structure appropriate for overload with instructions for supportive shoes.  Reappoint to recheck again in the next several weeks  X-ray indicates spur with no indications of stress fracture or advanced arthritis

## 2018-08-25 DIAGNOSIS — M25641 Stiffness of right hand, not elsewhere classified: Secondary | ICD-10-CM | POA: Diagnosis not present

## 2018-08-25 DIAGNOSIS — M79642 Pain in left hand: Secondary | ICD-10-CM | POA: Diagnosis not present

## 2018-08-25 DIAGNOSIS — T7840XD Allergy, unspecified, subsequent encounter: Secondary | ICD-10-CM | POA: Diagnosis not present

## 2018-08-25 DIAGNOSIS — M349 Systemic sclerosis, unspecified: Secondary | ICD-10-CM | POA: Diagnosis not present

## 2018-08-25 DIAGNOSIS — R29898 Other symptoms and signs involving the musculoskeletal system: Secondary | ICD-10-CM | POA: Diagnosis not present

## 2018-08-25 DIAGNOSIS — M25642 Stiffness of left hand, not elsewhere classified: Secondary | ICD-10-CM | POA: Diagnosis not present

## 2018-08-25 DIAGNOSIS — M79641 Pain in right hand: Secondary | ICD-10-CM | POA: Diagnosis not present

## 2018-08-31 DIAGNOSIS — R29898 Other symptoms and signs involving the musculoskeletal system: Secondary | ICD-10-CM | POA: Diagnosis not present

## 2018-08-31 DIAGNOSIS — M79641 Pain in right hand: Secondary | ICD-10-CM | POA: Diagnosis not present

## 2018-08-31 DIAGNOSIS — M349 Systemic sclerosis, unspecified: Secondary | ICD-10-CM | POA: Diagnosis not present

## 2018-08-31 DIAGNOSIS — M25642 Stiffness of left hand, not elsewhere classified: Secondary | ICD-10-CM | POA: Diagnosis not present

## 2018-08-31 DIAGNOSIS — M79642 Pain in left hand: Secondary | ICD-10-CM | POA: Diagnosis not present

## 2018-08-31 DIAGNOSIS — T7840XD Allergy, unspecified, subsequent encounter: Secondary | ICD-10-CM | POA: Diagnosis not present

## 2018-08-31 DIAGNOSIS — M25641 Stiffness of right hand, not elsewhere classified: Secondary | ICD-10-CM | POA: Diagnosis not present

## 2018-09-01 ENCOUNTER — Ambulatory Visit (AMBULATORY_SURGERY_CENTER): Payer: Self-pay

## 2018-09-01 ENCOUNTER — Other Ambulatory Visit: Payer: Self-pay

## 2018-09-01 VITALS — Ht 64.0 in | Wt 149.6 lb

## 2018-09-01 DIAGNOSIS — K2100 Gastro-esophageal reflux disease with esophagitis, without bleeding: Secondary | ICD-10-CM

## 2018-09-01 NOTE — Progress Notes (Signed)
No egg or soy allergy known to patient  No issues with past sedation with any surgeries  or procedures, no intubation problems  No diet pills per patient No home 02 use per patient  No blood thinners per patient  Pt denies issues with constipation  No A fib or A flutter  EMMI video sent to pt's e mail , pt declined    

## 2018-09-02 ENCOUNTER — Ambulatory Visit (INDEPENDENT_AMBULATORY_CARE_PROVIDER_SITE_OTHER): Payer: Medicare Other | Admitting: Podiatry

## 2018-09-02 ENCOUNTER — Encounter: Payer: Self-pay | Admitting: Podiatry

## 2018-09-02 DIAGNOSIS — M722 Plantar fascial fibromatosis: Secondary | ICD-10-CM

## 2018-09-04 DIAGNOSIS — R29898 Other symptoms and signs involving the musculoskeletal system: Secondary | ICD-10-CM | POA: Diagnosis not present

## 2018-09-04 DIAGNOSIS — T7840XD Allergy, unspecified, subsequent encounter: Secondary | ICD-10-CM | POA: Diagnosis not present

## 2018-09-04 DIAGNOSIS — M25641 Stiffness of right hand, not elsewhere classified: Secondary | ICD-10-CM | POA: Diagnosis not present

## 2018-09-04 DIAGNOSIS — M349 Systemic sclerosis, unspecified: Secondary | ICD-10-CM | POA: Diagnosis not present

## 2018-09-04 DIAGNOSIS — M79641 Pain in right hand: Secondary | ICD-10-CM | POA: Diagnosis not present

## 2018-09-04 DIAGNOSIS — M25642 Stiffness of left hand, not elsewhere classified: Secondary | ICD-10-CM | POA: Diagnosis not present

## 2018-09-04 DIAGNOSIS — M79642 Pain in left hand: Secondary | ICD-10-CM | POA: Diagnosis not present

## 2018-09-07 NOTE — Progress Notes (Signed)
Subjective:   Patient ID: [name], female   DOB: 68 y.o.   MRN: [id number]   HPI Patient presents stating that overall feeling some better and the brace seems to help and does not want an injection and seem to irritate the scleroderma that she has.  Patient states overall she is doing pretty well   ROS      Objective:  Physical Exam  Neurovascular status unchanged from previous visit with patient's plantar heel pain improved but still present bilateral.  Patient does have moderate discomfort when I press deep into the tissue but it is localized     Assessment:  Plantar fasciitis improved but still present     Plan:  Advised this patient on the continuation of support therapy anti-inflammatories and physical therapy.  Patient will be seen back as needed and I agree are no injection currently but at one point future this may be necessary

## 2018-09-08 DIAGNOSIS — J849 Interstitial pulmonary disease, unspecified: Secondary | ICD-10-CM | POA: Diagnosis not present

## 2018-09-08 DIAGNOSIS — M349 Systemic sclerosis, unspecified: Secondary | ICD-10-CM | POA: Diagnosis not present

## 2018-09-08 DIAGNOSIS — I7300 Raynaud's syndrome without gangrene: Secondary | ICD-10-CM | POA: Diagnosis not present

## 2018-09-08 DIAGNOSIS — Z79899 Other long term (current) drug therapy: Secondary | ICD-10-CM | POA: Diagnosis not present

## 2018-09-08 DIAGNOSIS — R12 Heartburn: Secondary | ICD-10-CM | POA: Diagnosis not present

## 2018-09-09 DIAGNOSIS — M25641 Stiffness of right hand, not elsewhere classified: Secondary | ICD-10-CM | POA: Diagnosis not present

## 2018-09-09 DIAGNOSIS — M25642 Stiffness of left hand, not elsewhere classified: Secondary | ICD-10-CM | POA: Diagnosis not present

## 2018-09-09 DIAGNOSIS — T7840XD Allergy, unspecified, subsequent encounter: Secondary | ICD-10-CM | POA: Diagnosis not present

## 2018-09-09 DIAGNOSIS — M79641 Pain in right hand: Secondary | ICD-10-CM | POA: Diagnosis not present

## 2018-09-09 DIAGNOSIS — M349 Systemic sclerosis, unspecified: Secondary | ICD-10-CM | POA: Diagnosis not present

## 2018-09-09 DIAGNOSIS — R29898 Other symptoms and signs involving the musculoskeletal system: Secondary | ICD-10-CM | POA: Diagnosis not present

## 2018-09-09 DIAGNOSIS — M79642 Pain in left hand: Secondary | ICD-10-CM | POA: Diagnosis not present

## 2018-09-10 ENCOUNTER — Encounter: Payer: Self-pay | Admitting: Obstetrics & Gynecology

## 2018-09-10 ENCOUNTER — Encounter: Payer: Self-pay | Admitting: Internal Medicine

## 2018-09-10 ENCOUNTER — Ambulatory Visit (INDEPENDENT_AMBULATORY_CARE_PROVIDER_SITE_OTHER): Payer: Medicare Other | Admitting: Obstetrics & Gynecology

## 2018-09-10 ENCOUNTER — Telehealth (INDEPENDENT_AMBULATORY_CARE_PROVIDER_SITE_OTHER): Payer: Medicare Other | Admitting: Obstetrics & Gynecology

## 2018-09-10 ENCOUNTER — Other Ambulatory Visit: Payer: Self-pay

## 2018-09-10 ENCOUNTER — Ambulatory Visit (AMBULATORY_SURGERY_CENTER): Payer: Medicare Other | Admitting: Internal Medicine

## 2018-09-10 VITALS — BP 148/70 | HR 80 | Resp 16 | Ht 64.0 in | Wt 149.6 lb

## 2018-09-10 VITALS — BP 159/67 | HR 74 | Temp 97.7°F | Resp 11 | Ht 64.0 in | Wt 149.0 lb

## 2018-09-10 DIAGNOSIS — R350 Frequency of micturition: Secondary | ICD-10-CM

## 2018-09-10 DIAGNOSIS — K449 Diaphragmatic hernia without obstruction or gangrene: Secondary | ICD-10-CM

## 2018-09-10 DIAGNOSIS — R300 Dysuria: Secondary | ICD-10-CM

## 2018-09-10 DIAGNOSIS — K219 Gastro-esophageal reflux disease without esophagitis: Secondary | ICD-10-CM | POA: Diagnosis not present

## 2018-09-10 DIAGNOSIS — K2970 Gastritis, unspecified, without bleeding: Secondary | ICD-10-CM | POA: Diagnosis not present

## 2018-09-10 DIAGNOSIS — K2100 Gastro-esophageal reflux disease with esophagitis, without bleeding: Secondary | ICD-10-CM

## 2018-09-10 LAB — POCT URINALYSIS DIPSTICK (MANUAL)
Nitrite, UA: NEGATIVE
PH UA: 7.5 (ref 5.0–8.0)
Poct Bilirubin: NEGATIVE
Poct Blood: 50 — AB
Poct Glucose: NORMAL mg/dL
Poct Ketones: NEGATIVE
Poct Protein: NEGATIVE mg/dL
Poct Urobilinogen: NORMAL mg/dL

## 2018-09-10 MED ORDER — SODIUM CHLORIDE 0.9 % IV SOLN: 500.0000 mL | Freq: Once | INTRAVENOUS | Status: DC

## 2018-09-10 MED ORDER — NITROFURANTOIN MONOHYD MACRO 100 MG PO CAPS: 100.0000 mg | ORAL_CAPSULE | Freq: Two times a day (BID) | ORAL | 0 refills | Status: DC

## 2018-09-10 NOTE — Telephone Encounter (Signed)
Pt had Upper GI procedure this morning with propofol. Husband driving her. She is here in the office.  Came from [place] to our office with c/o urinary frequency, intermittent since November. But since last night voiding q hour, sometimes ses small amounts, sometimes normal amounts. Some lower back pain, no kidney pain per patient. No fevers or pelvic pain.  No vaginal complaints.  Urine dip 2+ Leuks and 2+ Blood.  Will wait to see Dr. Sabra Heck.  Urine culture ordered.

## 2018-09-10 NOTE — Telephone Encounter (Signed)
Patient just called with UTI symptoms. She will be arriving at the office soon.

## 2018-09-10 NOTE — Op Note (Signed)
[facility] Patient Name: [name] Procedure Date: 09/10/2018 7:54 AM MRN: [id number] Endoscopist: Jerene Bears , MD Age: 68 Referring MD:  Date of Birth: 06-22-1951 Gender: Female Account #: [id number] Procedure:                Upper GI endoscopy Indications:              Epigastric abdominal pain, Follow-up of reflux                            esophagitis seen at EGD in July 2019, personal                            history of scleroderma Medicines:                Monitored Anesthesia Care Procedure:                Pre-Anesthesia Assessment:                           - Prior to the procedure, a History and Physical                            was performed, and patient medications and                            allergies were reviewed. The patient's tolerance of                            previous anesthesia was also reviewed. The risks                            and benefits of the procedure and the sedation                            options and risks were discussed with the patient.                            All questions were answered, and informed consent                            was obtained. Prior Anticoagulants: The patient has                            taken no previous anticoagulant or antiplatelet                            agents. ASA Grade Assessment: III - A patient with                            severe systemic disease. After reviewing the risks                            and benefits, the patient was deemed in  satisfactory condition to undergo the procedure.                           After obtaining informed consent, the endoscope was                            passed under direct vision. Throughout the                            procedure, the patient's blood pressure, pulse, and                            oxygen saturations were monitored continuously. The                            Model GIF-HQ190 [id number]) scope  was introduced                            through the mouth, and advanced to the second part                            of duodenum. The upper GI endoscopy was                            accomplished without difficulty. The patient                            tolerated the procedure well. Scope In: Scope Out: Findings:                 Normal mucosa was found in the entire esophagus.                            Previously observed esophagitis has healed.                           A 2 cm hiatal hernia was found. The proximal extent                            of the gastric folds (end of tubular esophagus) was                            36 cm from the incisors. The hiatal narrowing was                            38 cm from the incisors. The Z-line was 36 cm from                            the incisors.                           The gastroesophageal flap valve was visualized  endoscopically and classified as Hill Grade II                            (fold present, opens with respiration).                           Localized minimal inflammation characterized by                            erythema was found in the gastric antrum. Biopsies                            were taken with a cold forceps for histology and                            Helicobacter pylori testing.                           The exam of the stomach was otherwise normal.                           The examined duodenum was normal. Complications:            No immediate complications. Estimated Blood Loss:     Estimated blood loss was minimal. Impression:               Previously                           - Normal mucosa was found in the entire esophagus.                           - 2 cm hiatal hernia.                           - Gastroesophageal flap valve classified as Hill                            Grade II (fold present, opens with respiration).                           - Very minimal gastritis  (no evidence of GAVE).                            Biopsied.                           - Normal examined duodenum. Recommendation:           - Patient has a contact number available for                            emergencies. The signs and symptoms of potential                            delayed complications were discussed with the  patient. Return to normal activities tomorrow.                            Written discharge instructions were provided to the                            patient.                           - Resume previous diet.                           - Continue present medications.                           - Await pathology results.                           - Consideration of TIF, would need discussion with                            rheumatology in the setting of scleroderma. Jerene Bears, MD 09/10/2018 8:38:20 AM This report has been signed electronically.

## 2018-09-10 NOTE — Progress Notes (Signed)
Called to room to assist during endoscopic procedure.  Patient ID and intended procedure confirmed with present staff. Received instructions for my participation in the procedure from the performing physician.  

## 2018-09-10 NOTE — Patient Instructions (Signed)
YOU HAD AN ENDOSCOPIC PROCEDURE TODAY AT THE [facility]:   Refer to the procedure report that was given to you for any specific questions about what was found during the examination.  If the procedure report does not answer your questions, please call your gastroenterologist to clarify.  If you requested that your care partner not be given the details of your procedure findings, then the procedure report has been included in a sealed envelope for you to review at your convenience later.  YOU SHOULD EXPECT: Some feelings of bloating in the abdomen. Passage of more gas than usual.  Walking can help get rid of the air that was put into your GI tract during the procedure and reduce the bloating. If you had a lower endoscopy (such as a colonoscopy or flexible sigmoidoscopy) you may notice spotting of blood in your stool or on the toilet paper. If you underwent a bowel prep for your procedure, you may not have a normal bowel movement for a few days.  Please Note:  You might notice some irritation and congestion in your nose or some drainage.  This is from the oxygen used during your procedure.  There is no need for concern and it should clear up in a day or so.  SYMPTOMS TO REPORT IMMEDIATELY:   Following upper endoscopy (EGD)  Vomiting of blood or coffee ground material  New chest pain or pain under the shoulder blades  Painful or persistently difficult swallowing  New shortness of breath  Fever of 100F or higher  Black, tarry-looking stools  For urgent or emergent issues, a gastroenterologist can be reached at any hour by calling [phone].   DIET:  We do recommend a small meal at first, but then you may proceed to your regular diet.  Drink plenty of fluids but you should avoid alcoholic beverages for 24 hours.  ACTIVITY:  You should plan to take it easy for the rest of today and you should NOT DRIVE or use heavy machinery until tomorrow (because of the sedation medicines used  during the test).    FOLLOW UP: Our staff will call the number listed on your records the next business day following your procedure to check on you and address any questions or concerns that you may have regarding the information given to you following your procedure. If we do not reach you, we will leave a message.  However, if you are feeling well and you are not experiencing any problems, there is no need to return our call.  We will assume that you have returned to your regular daily activities without incident.  If any biopsies were taken you will be contacted by phone or by letter within the next 1-3 weeks.  Please call us at [phone] if you have not heard about the biopsies in 3 weeks.  Await for biopsy results  SIGNATURES/CONFIDENTIALITY: You and/or your care partner have signed paperwork which will be entered into your electronic medical record.  These signatures attest to the fact that that the information above on your After Visit Summary has been reviewed and is understood.  Full responsibility of the confidentiality of this discharge information lies with you and/or your care-partner. 

## 2018-09-10 NOTE — Progress Notes (Signed)
Pt's states no medical or surgical changes since previsit or office visit. 

## 2018-09-10 NOTE — Progress Notes (Signed)
PT taken to PACU. Monitors in place. VSS. Report given to RN. 

## 2018-09-10 NOTE — Progress Notes (Signed)
GYNECOLOGY  VISIT  CC:  Frequency, back pain, pelvic pain   HPI: 68 y.o. [id number] Married White or Caucasian female here for frequent urination, back pain, pelvic pain that has been intermittent since November.  She has used Azo several times and the symptoms have improved when she used this.  Last night, the symptoms started more severely.  She denies fever.    GYNECOLOGIC HISTORY: No LMP recorded. Patient is postmenopausal. Contraception: PMP Menopausal hormone therapy: none  Patient Active Problem List   Diagnosis Date Noted  . History of colonic polyps   . Anemia   . Acute esophagitis   . High risk medication use 05/08/2017  . Raynaud's disease without gangrene 05/08/2017  . 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 Abnormal renal function test 10/31/2016  . Mass of multiple sites of right breast 12/27/2013  . Osteopenia 12/27/2013  . Postmenopausal estrogen deficiency 12/27/2013  . Malignant neoplasm of upper-outer quadrant of left breast in female, estrogen receptor positive ([place]) 06/21/2013  . Dense breasts 12/14/2012  . Osteoarthritis     Past Medical History:  Diagnosis Date  . Anemia   . 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 Colon polyp   . 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 Depression   . Endometrial mass   . Esophageal stricture   . GERD (gastroesophageal reflux disease)   . Hiatal hernia   . History of external beam radiation therapy 11-25-2011 to 01-08-2012   left breast 45 Gy at 1.8 per fraction x25 fractions, boost 16 Gy at 2 per fraction x8 fractions  . Hyperlipidemia   . Hypothyroidism    . Internal hemorrhoids   . Osteoarthritis   . Osteopenia   . Positive ANA (antinuclear antibody)   . Rash    arms and legs caused by skin scleroderma  . Raynaud's syndrome   . 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 Ruthann [facility] at [facility]   systemic sclerosis , diffuse/   rheumotologist:  dr Manuella Ghazi at [facility]---  [place] 07-22-2017 (as of 07-25-2017 note not available to read)---  per pt has appt. w/ specialist at [facility]  . Skin thickening    hands, arms, legs  caused by scleroderma  . Systemic sclerosis [facility])     Past Surgical History:  Procedure Laterality Date  . BIOPSY  01/29/2018   Procedure: BIOPSY;  Surgeon: Jerene Bears, MD;  Location: [facility];  Service: Gastroenterology;;  . Lillard Anes  2000   Left  . COLONOSCOPY WITH PROPOFOL N/A 01/29/2018   Procedure: COLONOSCOPY WITH PROPOFOL;  Surgeon: Jerene Bears, MD;  Location: [facility];  Service: Gastroenterology;  Laterality: N/A;  . DILATATION & CURETTAGE/HYSTEROSCOPY WITH MYOSURE N/A 08/04/2017   Procedure: DILATATION & CURETTAGE/HYSTEROSCOPY WITH MYOSURE;  Surgeon: Megan Salon, MD;  Location: [facility];  Service: Gynecology;  Laterality: N/A;  . ECTOPIC PREGNANCY SURGERY  1986-88   s/p unilateral salpingectomy  . ESOPHAGOGASTRODUODENOSCOPY (EGD) WITH PROPOFOL N/A 01/29/2018   Procedure: ESOPHAGOGASTRODUODENOSCOPY (EGD) WITH PROPOFOL;  Surgeon: Jerene Bears, MD;  Location: [facility];  Service: Gastroenterology;  Laterality: N/A;  . PARTIAL MASTECTOMY WITH AXILLARY  SENTINEL LYMPH NODE BIOPSY Left 11-04-2011   dr Margot Chimes  [facility]  . TRANSTHORACIC ECHOCARDIOGRAM  06-04-2017    [facility]   moderate LVH, ef>55%/  trivial AR and TR/ mild MR and PR/  trivial pericardial effusion  . WISDOM TOOTH EXTRACTION      MEDS:   Current Outpatient Medications on File Prior to Visit  Medication Sig Dispense Refill  . SYNTHROID 100 MCG tablet Take 100 mcg by mouth daily before breakfast.     . tamoxifen (NOLVADEX)  20 MG tablet TAKE ONE TABLET BY MOUTH ONE TIME DAILY  90 tablet 0  . Doxepin HCl 5 % CREA Apply 1 application topically 3 (three) times daily as needed (itch).   3  . ELDERBERRY PO Take by mouth 2 (two) times daily.    . furosemide (LASIX) 20 MG tablet Take 40 mg by mouth daily.     [name] gabapentin (NEURONTIN) 100 MG capsule Take 100-200 mg by mouth See admin instructions. Take 118m by mouth in the morning and afternoon and 2078mat night  3  . meloxicam (MOBIC) 15 MG tablet Take 15 mg by mouth daily.     . mycophenolate (CELLCEPT) 500 MG tablet Take 1,500 mg by mouth 2 (two) times daily.     . pantoprazole (PROTONIX) 40 MG tablet TAKE 1 TABLET BY MOUTH TWICE DAILY BEFORE A MEAL (Patient not taking: Reported on 09/10/2018) 60 tablet 2   Current Facility-Administered Medications on File Prior to Visit  Medication Dose Route Frequency Provider Last Rate Last Dose  . 0.9 %  sodium chloride infusion  500 mL Intravenous Once Pyrtle, JaLajuan LinesMD        ALLERGIES: Betadine [povidone iodine]; Contrast media [iodinated diagnostic agents]; Epinephrine; Oxycodone; Nickel; Other; Penicillins; and Sulfa antibiotics  Family History  Problem Relation Age of Onset  . Heart disease Mother   . Heart failure Mother   . Heart disease Father   . Ovarian cancer Maternal Aunt   . Heart disease Paternal Uncle        multiple  . Autoimmune disease Sister   . Arrhythmia Sister   . Anemia Sister   . CAD Brother   . Colon cancer Neg Hx   . Stomach cancer Neg Hx   . Esophageal cancer Neg Hx   . Rectal cancer Neg Hx     SH:  Married, non smoker  Review of Systems  Genitourinary: Positive for dysuria, frequency and urgency.       Night urination  Loss of urine with sneeze   All other systems reviewed and are negative.   PHYSICAL EXAMINATION:    BP (!) 148/70 (BP Location: Right Arm, Patient Position: Sitting, Cuff Size: Normal)   Pulse 80   Resp 16   Ht _0  (1.626 m)   Wt 149 lb 9.6 oz (67.9 kg)    BMI 25.68 kg/m     General appearance: alert, cooperative and appears stated age Flank:  No CVA tenderness Abdomen: soft, non-tender; bowel sounds normal; no masses,  no organomegaly Lymph:  no inguinal LAD noted  Pelvic: External genitalia:  no lesions              Urethra:  normal appearing urethra with no masses, tenderness or lesions              Bartholins and Skenes: normal                 Vagina: normal appearing vagina with normal color  and discharge, no lesions               Chaperone was present for exam.  Assessment: Dysuria Scleroderma  Plan: Urine culture pending Macrobid 158m bid x 5 days to pharmacy.   Results and additional recommendations will be made to the pt.

## 2018-09-11 ENCOUNTER — Telehealth: Payer: Self-pay | Admitting: *Deleted

## 2018-09-11 NOTE — Telephone Encounter (Signed)
  Follow up Call-  Call back number 09/10/2018  Post procedure Call Back phone  # [phone]  Permission to leave phone message Yes  Some recent data might be hidden     Patient questions:  Do you have a fever, pain , or abdominal swelling? No. Pain Score  0 *  Have you tolerated food without any problems? Yes.    Have you been able to return to your normal activities? Yes.    Do you have any questions about your discharge instructions: Diet   No. Medications  No. Follow up visit  No.  Do you have questions or concerns about your Care? No.  Actions: * If pain score is 4 or above: No action needed, pain <4.

## 2018-09-13 LAB — URINE CULTURE

## 2018-09-15 ENCOUNTER — Telehealth: Payer: Self-pay | Admitting: Obstetrics & Gynecology

## 2018-09-15 DIAGNOSIS — B9689 Other specified bacterial agents as the cause of diseases classified elsewhere: Secondary | ICD-10-CM

## 2018-09-15 DIAGNOSIS — N390 Urinary tract infection, site not specified: Principal | ICD-10-CM

## 2018-09-15 MED ORDER — CIPROFLOXACIN HCL 500 MG PO TABS: 500.0000 mg | ORAL_TABLET | Freq: Two times a day (BID) | ORAL | 0 refills | Status: DC

## 2018-09-15 NOTE — Telephone Encounter (Signed)
Patient is having the same symptoms as before and would like to come in and leave a sample for culture Wednesday morning if possible.

## 2018-09-15 NOTE — Telephone Encounter (Signed)
Ok for her to come into or tomorrow and leave urine specimen.  If still symptomatic, I will continue antibiotics until culture is back.

## 2018-09-15 NOTE — Telephone Encounter (Signed)
Spoke with patient, advised as seen below per Dr. Sabra Heck. Rx for cipro to verified pharmacy. Will keep nurse visit for 3/4 at 8:30am.  Patient verbalizes understanding and is agreeable.   Encounter closed.

## 2018-09-15 NOTE — Telephone Encounter (Signed)
Ok to send in rx for ciprofloxin 500mg  bid x 3 days.  She is allergic to sulfa and PCN.  Ok to take now.

## 2018-09-15 NOTE — Telephone Encounter (Signed)
Routing to Dr. Sabra Heck.  Okay to order urine culture?   Notes recorded by Polly Cobia, CMA on 09/14/2018 at 1:16 PM EST Pt notified. Verbalized understanding. Patient still taking her abx. Last day today. She will call us Wednesday and if she still having symptoms then she will need urine culture. ------  Notes recorded by Megan Salon, MD on 09/14/2018 at 12:46 PM EST Please let pt know her urine culture showed Klebsiella, a common urinary bacteria. The macrobid should have worked and she should finish all five days of the medication. If symptoms fully resolve, repeat urine culture not needed. Thanks.

## 2018-09-15 NOTE — Telephone Encounter (Signed)
Call to patient. She still continues to have Dysuria. No fevers or flank pain. Completed Macrobid.  Coming tomorrow for urine culture. Cannot come in today.  Advised per Dr. Sabra Heck would continue antibiotics.  If restarts abx, does she need to wait until after she provides culture?   She also is concerned about her kidney function r/t to scleroderma.  She has standing orders for lab work at Dr. Manya Silvas office and will go to Dr. Manya Silvas office after she gives [place] the urine culture sample.

## 2018-09-16 ENCOUNTER — Ambulatory Visit (INDEPENDENT_AMBULATORY_CARE_PROVIDER_SITE_OTHER): Payer: Medicare Other

## 2018-09-16 DIAGNOSIS — N390 Urinary tract infection, site not specified: Secondary | ICD-10-CM | POA: Diagnosis not present

## 2018-09-16 DIAGNOSIS — M349 Systemic sclerosis, unspecified: Secondary | ICD-10-CM | POA: Diagnosis not present

## 2018-09-16 DIAGNOSIS — B9689 Other specified bacterial agents as the cause of diseases classified elsewhere: Secondary | ICD-10-CM | POA: Diagnosis not present

## 2018-09-16 NOTE — Progress Notes (Signed)
Patient is here for a repeat urin culture. She is still experiencing pressure. She is still taking the Cipro. Advised patient to call if she gets worse.   Routing to provider for final review.

## 2018-09-17 ENCOUNTER — Encounter: Payer: Self-pay | Admitting: Internal Medicine

## 2018-09-17 LAB — URINE CULTURE

## 2018-09-18 DIAGNOSIS — M79642 Pain in left hand: Secondary | ICD-10-CM | POA: Diagnosis not present

## 2018-09-18 DIAGNOSIS — M25641 Stiffness of right hand, not elsewhere classified: Secondary | ICD-10-CM | POA: Diagnosis not present

## 2018-09-18 DIAGNOSIS — R29898 Other symptoms and signs involving the musculoskeletal system: Secondary | ICD-10-CM | POA: Diagnosis not present

## 2018-09-18 DIAGNOSIS — T7840XD Allergy, unspecified, subsequent encounter: Secondary | ICD-10-CM | POA: Diagnosis not present

## 2018-09-18 DIAGNOSIS — M79641 Pain in right hand: Secondary | ICD-10-CM | POA: Diagnosis not present

## 2018-09-18 DIAGNOSIS — M25642 Stiffness of left hand, not elsewhere classified: Secondary | ICD-10-CM | POA: Diagnosis not present

## 2018-09-18 DIAGNOSIS — M349 Systemic sclerosis, unspecified: Secondary | ICD-10-CM | POA: Diagnosis not present

## 2018-09-25 ENCOUNTER — Other Ambulatory Visit: Payer: Self-pay

## 2018-09-25 ENCOUNTER — Ambulatory Visit (INDEPENDENT_AMBULATORY_CARE_PROVIDER_SITE_OTHER): Payer: Medicare Other | Admitting: Obstetrics & Gynecology

## 2018-09-25 VITALS — BP 134/70 | HR 88 | Resp 16 | Ht 64.0 in | Wt 147.6 lb

## 2018-09-25 DIAGNOSIS — N898 Other specified noninflammatory disorders of vagina: Secondary | ICD-10-CM

## 2018-09-25 DIAGNOSIS — R3915 Urgency of urination: Secondary | ICD-10-CM | POA: Diagnosis not present

## 2018-09-25 LAB — POCT URINALYSIS DIPSTICK
Bilirubin, UA: NEGATIVE
Glucose, UA: NEGATIVE
Ketones, UA: NEGATIVE
Nitrite, UA: NEGATIVE
Protein, UA: NEGATIVE
Urobilinogen, UA: 0.2 E.U./dL
pH, UA: 5 (ref 5.0–8.0)

## 2018-09-25 MED ORDER — CIPROFLOXACIN HCL 500 MG PO TABS: 500.0000 mg | ORAL_TABLET | Freq: Two times a day (BID) | ORAL | 0 refills | Status: DC

## 2018-09-25 NOTE — Progress Notes (Signed)
GYNECOLOGY  VISIT  CC:   UTI symtoms  HPI: 68 y.o. G24P1010 Married White or Caucasian female here for possible recurrent UTI symptoms.  She was seen on 09/10/18 with complaints consistent with UTI.  Urine culture showed Klebsiella.  She was initially treated with macrobid 113m bid x 5 days.  She called before she was done with the antibiotics reporting that the symptoms were not really gone.  She was switched to Cipro 5062mbid x 3 days.    Denies vaginal discharge and vaginal bleeding.    GYNECOLOGIC HISTORY: No LMP recorded. Patient is postmenopausal. Contraception: PMP Menopausal hormone therapy: none  Patient Active Problem List   Diagnosis Date Noted  . History of colonic polyps   . Anemia   . Acute esophagitis   . High risk medication use 05/08/2017  . Raynaud's disease without gangrene 05/08/2017  . 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 Abnormal renal function test 10/31/2016  . Mass of multiple sites of right breast 12/27/2013  . Osteopenia 12/27/2013  . Postmenopausal estrogen deficiency 12/27/2013  . Malignant neoplasm of upper-outer quadrant of left breast in female, estrogen receptor positive (HCB[facility]12/02/2013  . Dense breasts 12/14/2012  . Osteoarthritis     Past Medical History:  Diagnosis Date  . Anemia   . Breastr cancer, IDC, Left UOQ, clinical stage II Receptor +, Her 2 - 08/29/2011 DX   oncologist-- dr maJana Hakim  Stage IIA, 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, ER/PR+, HER2 negative -- 11-04-2011  left partial mastectomy w/ sln dissection-- completed radiation 01-08-2012-- started tamoxifen 07/ 2013-- per lov note 11/ 2018 no recurrence  . Colon polyp   . 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 Depression   . Endometrial mass   . Esophageal stricture   . GERD (gastroesophageal reflux disease)   . Hiatal hernia   . History of external beam radiation therapy 11-25-2011 to 01-08-2012    left breast 45 Gy at 1.8 per fraction x25 fractions, boost 16 Gy at 2 per fraction x8 fractions  . Hyperlipidemia   . Hypothyroidism   . Internal hemorrhoids   . Osteoarthritis   . Osteopenia   . Positive ANA (antinuclear antibody)   . Rash    arms and legs caused by skin scleroderma  . Raynaud's syndrome   . Scleroderma involving lung ([facility] [facility]-  [facility] , diffuse/   r[facility]umot[facility]  [facility]  [place] (as of 07-25-2017 note not available to read)---  [facility]. Skin thickening    hands, arms, legs  caused by scleroderma  . Systemic sclerosis ([facility]    Past Surgical History:  Procedure Laterality Date  . BIOPSY  01/29/2018   Procedure: BIOPSY;  Surgeon: PyJerene BearsMD;  [facility];  Service: Gastroenterology;;  . BULillard Anes2000   Left  . COLONOSCOPY WITH PROPOFOL N/A 01/29/2018   Procedure: COLONOSCOPY WITH PROPOFOL;  Surgeon: PyJerene BearsMD;  [facility];  Service: Gastroenterology;  Laterality: N/A;  . DILATATION & CURETTAGE/HYSTEROSCOPY WITH MYOSURE N/A 08/04/2017   Procedure: DILATATION & CURETTAGE/HYSTEROSCOPY WITH MYOSURE;  Surgeon: MiMegan SalonMD;  [facility] Service: [facility];  Laterality: N/A;  . ECTOPIC PREGNANCY SURGERY  1986-88   s/p unilateral salpingectomy  . ESOPHAGOGASTRODUODENOSCOPY (EGD) WITH PROPOFOL N/A 01/29/2018  Procedure: ESOPHAGOGASTRODUODENOSCOPY (EGD) WITH PROPOFOL;  Surgeon: Jerene Bears, MD;  [facility];  Service: Gastroenterology;  Laterality: N/A;  . PARTIAL MASTECTOMY WITH AXILLARY SENTINEL LYMPH NODE BIOPSY Left 11-04-2011   dr Margot [facility]imes  [facility]  . TRANSTHORACIC ECHOCARDIOGRAM  06-04-2017    [facility]   moderate LVH, ef>55%/  trivial AR and TR/ mild MR and PR/  trivial pericardial effusion  . WISDOM TOOTH EXTRACTION      MEDS:   Current Outpatient Medications on File Prior to Visit   Medication Sig Dispense Refill  . Doxepin HCl 5 % CREA Apply 1 application topically 3 (three) times daily as needed (itch).   3  . ELDERBERRY PO Take by mouth 2 (two) times daily.    . furosemide (LASIX) 20 MG tablet Take 40 mg by mouth daily.     [name] gabapentin (NEURONTIN) 100 MG capsule Take 100-200 mg by mouth See admin instructions. Take 152m by mouth in the morning and afternoon and 2038mat night  3  . meloxicam (MOBIC) 15 MG tablet Take 15 mg by mouth daily.     . pantoprazole (PROTONIX) 40 MG tablet TAKE 1 TABLET BY MOUTH TWICE DAILY BEFORE A MEAL 60 tablet 2  . SYNTHROID 100 MCG tablet Take 100 mcg by mouth daily before breakfast.     . tamoxifen (NOLVADEX) 20 MG tablet TAKE ONE TABLET BY MOUTH ONE TIME DAILY  90 tablet 0   Current Facility-Administered Medications on File Prior to Visit  Medication Dose Route Frequency Provider Last Rate Last Dose  . 0.9 %  sodium chloride infusion  500 mL Intravenous Once Pyrtle, JaLajuan LinesMD        ALLERGIES: Betadine [povidone iodine]; Contrast media [iodinated diagnostic agents]; Epinephrine; Oxycodone; Nickel; Other; Penicillins; and Sulfa antibiotics  Family History  Problem Relation Age of Onset  . Heart disease Mother   . Heart failure Mother   . Heart disease Father   . Ovarian cancer Maternal Aunt   . Heart disease Paternal Uncle        multiple  . Autoimmune disease Sister   . Arrhythmia Sister   . Anemia Sister   . CAD Brother   . Colon cancer Neg Hx   . Stomach cancer Neg Hx   . Esophageal cancer Neg Hx   . Rectal cancer Neg Hx     SH:  Married, non smoker  Review of Systems  HENT: Positive for congestion.   Gastrointestinal: Positive for abdominal distention.  Genitourinary: Positive for frequency and urgency.       Loss of urine spontaneously Loss of urine with sneeze or cough  Night urination    All other systems reviewed and are negative.   PHYSICAL EXAMINATION:    BP 134/70 (BP Location: Right Arm, Patient  Position: Sitting, Cuff Size: Normal)   Pulse 88   Resp 16   Ht '5\' 4"'  (1.626 m)   Wt 147 lb 9.6 oz (67 kg)   BMI 25.34 kg/m     General appearance: alert, cooperative and appears stated age Abdomen: soft, non-tender; bowel sounds normal; no masses,  no organomegaly Lymph:  no inguinal LAD noted  Pelvic: External genitalia:  no lesions              Urethra:  normal appearing urethra with no masses, tenderness or lesions              Bartholins and Skenes: normal  Vagina: normal appearing vagina with normal color and discharge, no lesions              Cervix: no lesions              Bimanual Exam:  Uterus:  normal size, contour, position, consistency, mobility, non-tender              Adnexa: no mass, fullness, tenderness  Chaperone was present for exam.  Assessment: Possible recurrent UTI but I feel more likely this is related to atrophy as last culture was negative  Plan: Urine culture and micro pending Vaginitis testing obtained today.  If above is all negative, will consider additional options Rx for cipro 572m bid x 3 days for pt to use if symptoms worsen but she knows I'd really like her to not use this if possible

## 2018-09-26 LAB — VAGINITIS/VAGINOSIS, DNA PROBE
Candida Species: NEGATIVE
Gardnerella vaginalis: NEGATIVE
TRICHOMONAS VAG: NEGATIVE

## 2018-09-26 LAB — URINALYSIS, MICROSCOPIC ONLY
BACTERIA UA: NONE SEEN
CASTS: NONE SEEN /LPF

## 2018-09-26 LAB — URINE CULTURE

## 2018-09-27 ENCOUNTER — Encounter: Payer: Self-pay | Admitting: Obstetrics & Gynecology

## 2018-09-28 DIAGNOSIS — M25641 Stiffness of right hand, not elsewhere classified: Secondary | ICD-10-CM | POA: Diagnosis not present

## 2018-09-28 DIAGNOSIS — M79642 Pain in left hand: Secondary | ICD-10-CM | POA: Diagnosis not present

## 2018-09-28 DIAGNOSIS — R29898 Other symptoms and signs involving the musculoskeletal system: Secondary | ICD-10-CM | POA: Diagnosis not present

## 2018-09-28 DIAGNOSIS — M79641 Pain in right hand: Secondary | ICD-10-CM | POA: Diagnosis not present

## 2018-09-28 DIAGNOSIS — M25642 Stiffness of left hand, not elsewhere classified: Secondary | ICD-10-CM | POA: Diagnosis not present

## 2018-09-28 DIAGNOSIS — T7840XD Allergy, unspecified, subsequent encounter: Secondary | ICD-10-CM | POA: Diagnosis not present

## 2018-09-28 DIAGNOSIS — M349 Systemic sclerosis, unspecified: Secondary | ICD-10-CM | POA: Diagnosis not present

## 2018-10-08 DIAGNOSIS — L565 Disseminated superficial actinic porokeratosis (DSAP): Secondary | ICD-10-CM | POA: Diagnosis not present

## 2018-10-08 DIAGNOSIS — I788 Other diseases of capillaries: Secondary | ICD-10-CM | POA: Diagnosis not present

## 2018-10-08 DIAGNOSIS — L821 Other seborrheic keratosis: Secondary | ICD-10-CM | POA: Diagnosis not present

## 2018-10-08 DIAGNOSIS — Z85828 Personal history of other malignant neoplasm of skin: Secondary | ICD-10-CM | POA: Diagnosis not present

## 2018-10-08 DIAGNOSIS — D225 Melanocytic nevi of trunk: Secondary | ICD-10-CM | POA: Diagnosis not present

## 2018-10-08 DIAGNOSIS — L812 Freckles: Secondary | ICD-10-CM | POA: Diagnosis not present

## 2018-10-31 ENCOUNTER — Other Ambulatory Visit: Payer: Self-pay | Admitting: Oncology

## 2018-11-03 ENCOUNTER — Ambulatory Visit: Payer: Medicare Other | Admitting: Obstetrics & Gynecology

## 2018-11-06 IMAGING — DX DG CHEST 2V
2 series · 2 of 2 positions shown · non-contrast
Comparison: 01/21/2017

CLINICAL DATA: Left chest pain for 2 months, no known injury,
initial encounter

EXAM:
CHEST  2 VIEW

[chest pa]
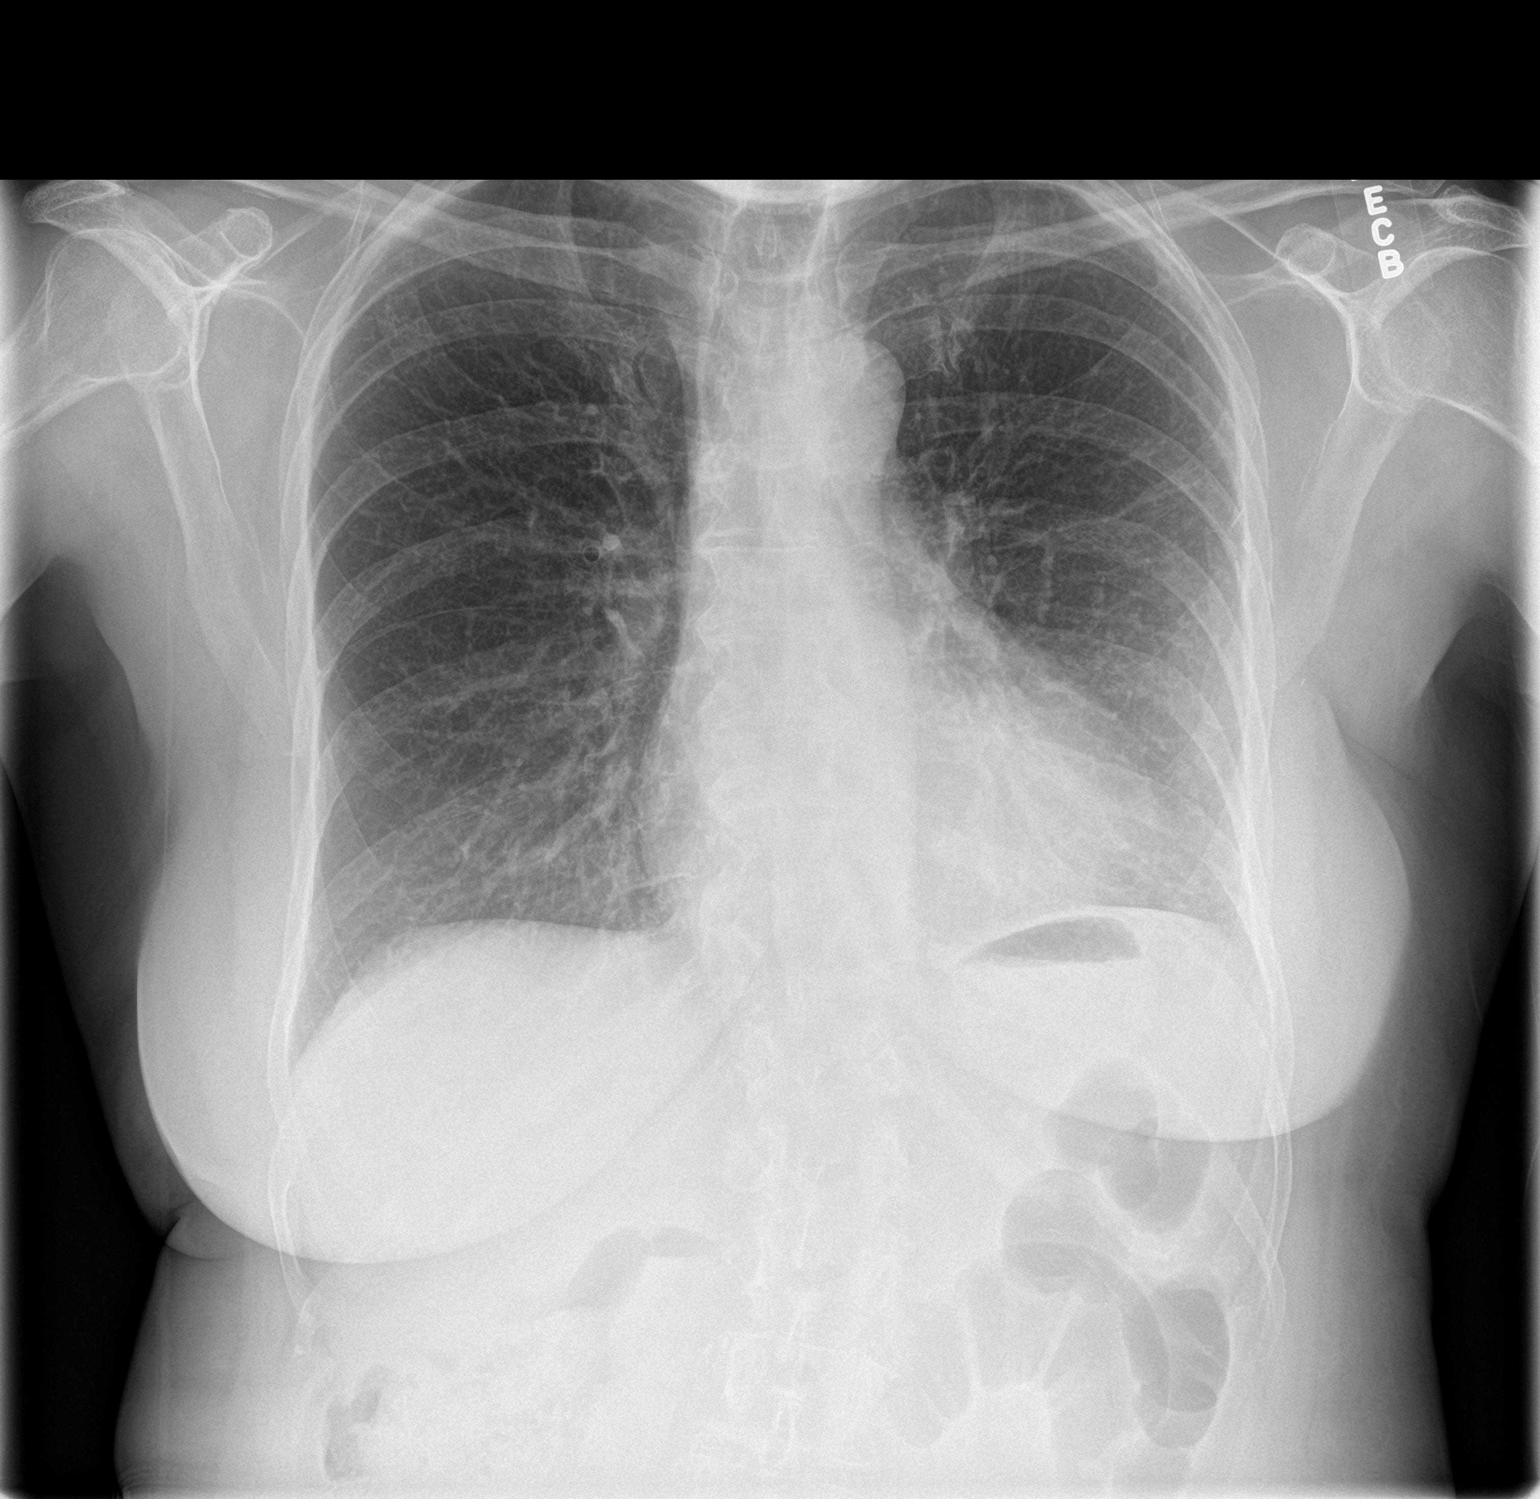

[chest lat]
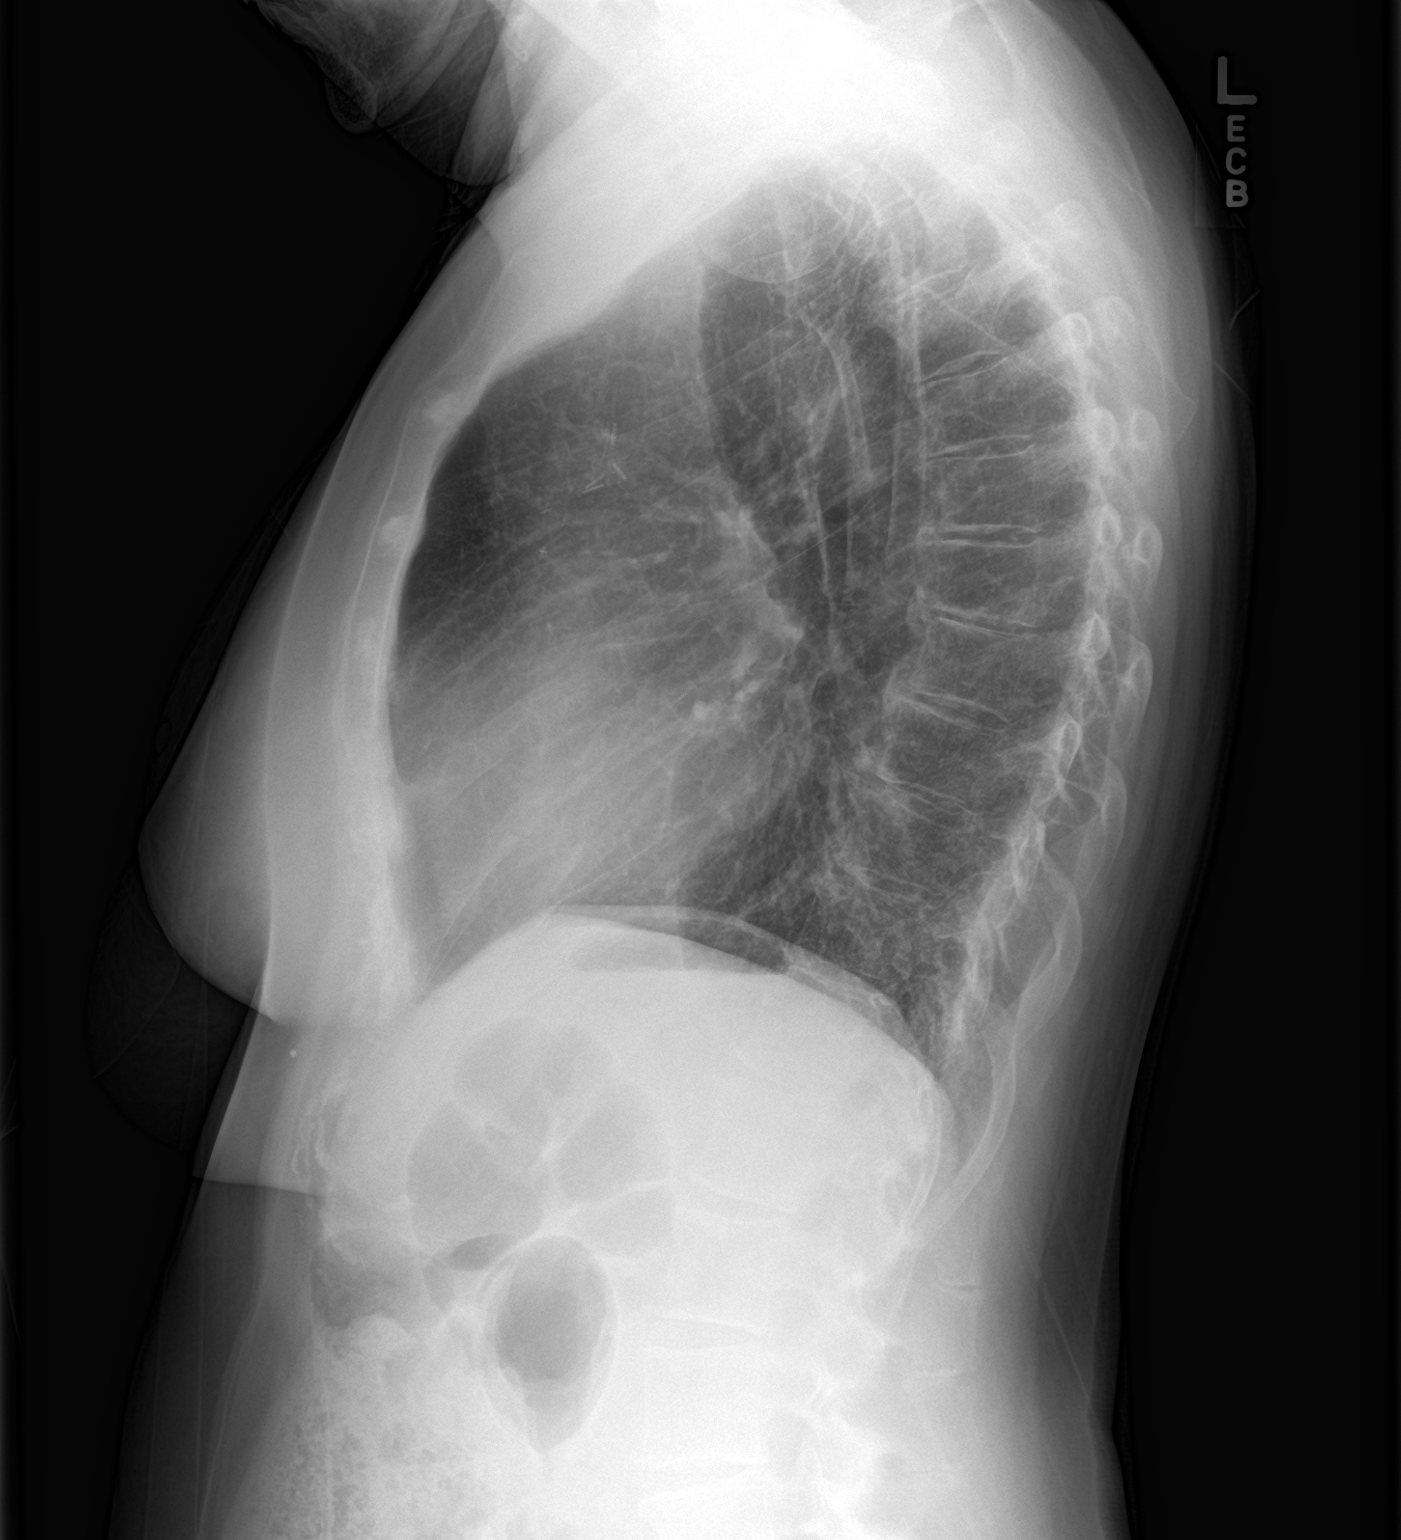

[2 of 2 positions shown; findings below may reference images not displayed]

FINDINGS: Cardiac shadow is stable. Postsurgical changes in left axilla and
left breast are noted. No focal infiltrate or sizable effusion is
seen. No acute bony abnormality is seen.
IMPRESSION: No acute abnormality noted.

## 2018-11-06 IMAGING — NM NM PULMONARY VENT & PERF
14 series · 14 of 14 positions shown · non-contrast
Comparison: Current chest radiograph

CLINICAL DATA: Chest pain of unknown etiology.

EXAM:
NUCLEAR MEDICINE VENTILATION - PERFUSION LUNG SCAN
TECHNIQUE: Ventilation images were obtained in multiple projections using
inhaled aerosol Qc-00m DTPA. Perfusion images were obtained in
multiple projections after intravenous injection of Qc-00m MAA.
RADIOPHARMACEUTICALS:  32.0 mCi of Qc-00m DTPA aerosol inhalation
and 4.2 mCi OcIIm MAA-IV

[Series 2: lao/rpo vent · 4.14mm/px · 1 of 1 slices shown (1 of 2)]
[im 1/1]
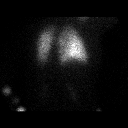

[Series 2: lao/rpo vent · 4.14mm/px · 1 of 1 slices shown (2 of 2)]
[im 1/1]
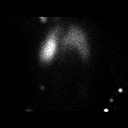

[Series 3: lpo/rao vent · 4.14mm/px · 1 of 1 slices shown (1 of 2)]
[im 1/1]
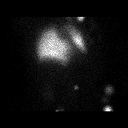

[Series 3: lpo/rao vent · 4.14mm/px · 1 of 1 slices shown (2 of 2)]
[im 1/1]
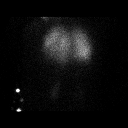

[Series 4: lt lat/rt lat vent · 4.14mm/px · 1 of 1 slices shown (1 of 2)]
[im 1/1]
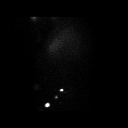

[Series 4: lt lat/rt lat vent · 4.14mm/px · 1 of 1 slices shown (2 of 2)]
[im 1/1]
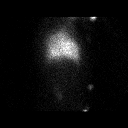

[Series 5: lt lat/rt lat perf · 4.14mm/px · 1 of 1 slices shown (1 of 2)]
[im 1/1]
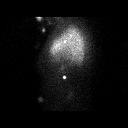

[Series 5: lt lat/rt lat perf · 4.14mm/px · 1 of 1 slices shown (2 of 2)]
[im 1/1]
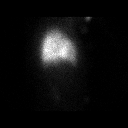

[Series 6: lpo/rao perf · 4.14mm/px · 1 of 1 slices shown (1 of 2)]
[im 1/1]
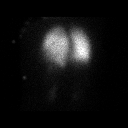

[Series 6: lpo/rao perf · 4.14mm/px · 1 of 1 slices shown (2 of 2)]
[im 1/1]
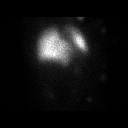

[Series 7: ant/post perf · 4.14mm/px · 1 of 1 slices shown (1 of 2)]
[im 1/1]
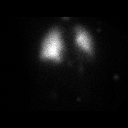

[Series 7: ant/post perf · 4.14mm/px · 1 of 1 slices shown (2 of 2)]
[im 1/1]
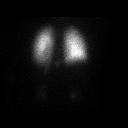

[Series 8: lao/rpo perf · 4.14mm/px · 1 of 1 slices shown (1 of 2)]
[im 1/1]
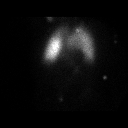

[Series 8: lao/rpo perf · 4.14mm/px · 1 of 1 slices shown (2 of 2)]
[im 1/1]
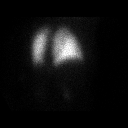

[14 of 14 positions shown; findings below may reference images not displayed]

FINDINGS: Ventilation: No focal ventilation defect.

Perfusion: No wedge shaped peripheral perfusion defects to suggest
acute pulmonary embolism.
IMPRESSION: 1. Normal ventilation perfusion study. No evidence of a pulmonary
embolism.

## 2018-11-30 DIAGNOSIS — M79646 Pain in unspecified finger(s): Secondary | ICD-10-CM | POA: Diagnosis not present

## 2018-11-30 DIAGNOSIS — M349 Systemic sclerosis, unspecified: Secondary | ICD-10-CM | POA: Diagnosis not present

## 2018-11-30 DIAGNOSIS — M79643 Pain in unspecified hand: Secondary | ICD-10-CM | POA: Diagnosis not present

## 2018-11-30 DIAGNOSIS — M25641 Stiffness of right hand, not elsewhere classified: Secondary | ICD-10-CM | POA: Diagnosis not present

## 2018-11-30 DIAGNOSIS — M25642 Stiffness of left hand, not elsewhere classified: Secondary | ICD-10-CM | POA: Diagnosis not present

## 2018-11-30 DIAGNOSIS — R29898 Other symptoms and signs involving the musculoskeletal system: Secondary | ICD-10-CM | POA: Diagnosis not present

## 2018-12-10 DIAGNOSIS — M79646 Pain in unspecified finger(s): Secondary | ICD-10-CM | POA: Diagnosis not present

## 2018-12-10 DIAGNOSIS — M349 Systemic sclerosis, unspecified: Secondary | ICD-10-CM | POA: Diagnosis not present

## 2018-12-10 DIAGNOSIS — M25642 Stiffness of left hand, not elsewhere classified: Secondary | ICD-10-CM | POA: Diagnosis not present

## 2018-12-10 DIAGNOSIS — R29898 Other symptoms and signs involving the musculoskeletal system: Secondary | ICD-10-CM | POA: Diagnosis not present

## 2018-12-10 DIAGNOSIS — M79643 Pain in unspecified hand: Secondary | ICD-10-CM | POA: Diagnosis not present

## 2018-12-10 DIAGNOSIS — M25641 Stiffness of right hand, not elsewhere classified: Secondary | ICD-10-CM | POA: Diagnosis not present

## 2018-12-14 MED ORDER — PANTOPRAZOLE SODIUM 40 MG PO TBEC: DELAYED_RELEASE_TABLET | ORAL | 1 refills | Status: DC

## 2018-12-17 ENCOUNTER — Encounter: Payer: Self-pay | Admitting: Podiatry

## 2018-12-17 ENCOUNTER — Ambulatory Visit (INDEPENDENT_AMBULATORY_CARE_PROVIDER_SITE_OTHER): Payer: Medicare Other | Admitting: Podiatry

## 2018-12-17 ENCOUNTER — Other Ambulatory Visit: Payer: Self-pay

## 2018-12-17 VITALS — Temp 97.5°F

## 2018-12-17 DIAGNOSIS — M7752 Other enthesopathy of left foot: Secondary | ICD-10-CM | POA: Diagnosis not present

## 2018-12-17 DIAGNOSIS — M779 Enthesopathy, unspecified: Secondary | ICD-10-CM

## 2018-12-17 MED ORDER — TRIAMCINOLONE ACETONIDE 10 MG/ML IJ SUSP
10.0000 mg | Freq: Once | INTRAMUSCULAR | Status: AC
  Administered 2018-12-17: 10 mg

## 2018-12-18 DIAGNOSIS — M25642 Stiffness of left hand, not elsewhere classified: Secondary | ICD-10-CM | POA: Diagnosis not present

## 2018-12-18 DIAGNOSIS — M79646 Pain in unspecified finger(s): Secondary | ICD-10-CM | POA: Diagnosis not present

## 2018-12-18 DIAGNOSIS — M79643 Pain in unspecified hand: Secondary | ICD-10-CM | POA: Diagnosis not present

## 2018-12-18 DIAGNOSIS — R29898 Other symptoms and signs involving the musculoskeletal system: Secondary | ICD-10-CM | POA: Diagnosis not present

## 2018-12-18 DIAGNOSIS — M349 Systemic sclerosis, unspecified: Secondary | ICD-10-CM | POA: Diagnosis not present

## 2018-12-18 DIAGNOSIS — M25641 Stiffness of right hand, not elsewhere classified: Secondary | ICD-10-CM | POA: Diagnosis not present

## 2018-12-18 NOTE — Progress Notes (Signed)
Subjective:   Patient ID: [name], female   DOB: 68 y.o.   MRN: [id number]   HPI Patient states she is improved but is still having discomfort in her ankle and it seems to be in a different place than previously   ROS      Objective:  Physical Exam  Neurovascular status intact with inflammation of the sinus tarsi now noted with significant reduction of plantar fascial symptomatology     Assessment:  Appears to be sinus tarsitis with inflammatory capsulitis left with improvement of plantar fascial symptomatology     Plan:  H&P reviewed condition sterile prep done and I injected the sinus tarsi 3 mg Kenalog 5 mg Xylocaine advised on reduced activity and reappoint as symptoms indicate

## 2018-12-28 ENCOUNTER — Other Ambulatory Visit: Payer: Self-pay

## 2018-12-28 ENCOUNTER — Ambulatory Visit (INDEPENDENT_AMBULATORY_CARE_PROVIDER_SITE_OTHER): Payer: Medicare Other | Admitting: Obstetrics & Gynecology

## 2018-12-28 ENCOUNTER — Encounter: Payer: Self-pay | Admitting: Obstetrics & Gynecology

## 2018-12-28 VITALS — BP 122/64 | HR 74 | Resp 14 | Ht 63.75 in | Wt 149.0 lb

## 2018-12-28 DIAGNOSIS — Z23 Encounter for immunization: Secondary | ICD-10-CM

## 2018-12-28 DIAGNOSIS — M349 Systemic sclerosis, unspecified: Secondary | ICD-10-CM

## 2018-12-28 DIAGNOSIS — Z170 Estrogen receptor positive status [ER+]: Secondary | ICD-10-CM | POA: Diagnosis not present

## 2018-12-28 DIAGNOSIS — Z124 Encounter for screening for malignant neoplasm of cervix: Secondary | ICD-10-CM

## 2018-12-28 DIAGNOSIS — C50412 Malignant neoplasm of upper-outer quadrant of left female breast: Secondary | ICD-10-CM

## 2018-12-28 MED ORDER — MIRABEGRON ER 25 MG PO TB24: 25.0000 mg | ORAL_TABLET | Freq: Every day | ORAL | 2 refills | Status: DC

## 2018-12-28 NOTE — Progress Notes (Signed)
68 y.o. G21P1010 Married White or Caucasian female here for yearly cancer screening as recommended by rheumatologist at [facility] due to her scleroderma.    Denies vaginal bleeding.    Has not had breast imaging.  MMG is no longer recommended.  Will only have yearly MRI.  Did not have this last year.  Aware this id due.  Will call Dr. [name] for this appt.  PCP:  Dr. Brigitte Pulse.  Has virtual appt scheduled.  Will have blood work done before visit in July.    No LMP recorded. Patient is postmenopausal.          Sexually active: No.  The current method of family planning is none.    Exercising: Yes.    jazzercize and utube  Smoker:  no  Health Maintenance: Pap:  04/04/17 Normal HR HPV Neg History of abnormal Pap:  no MMG:  11/14/16 Bi-rads 2 benign density C Colonoscopy: 2019 with Pyrtle   BMD: 04/18/14 osteopenia  TDaP:  04/12/2008 Pneumonia vaccine(s):  declines Shingrix:  2013 Hep C testing: done at [place]: PCP   reports that she has never smoked. She has never used smokeless tobacco. She reports current alcohol use. She reports that she does not use drugs.  Past Medical History:  Diagnosis Date  . Anemia   . 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 Colon polyp   . 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 Depression   . Esophageal stricture   . GERD (gastroesophageal reflux disease)   . Hiatal hernia   . History of external beam radiation therapy 11-25-2011 to 01-08-2012   left breast 45 Gy at 1.8 per fraction x25 fractions, boost 16 Gy at 2 per fraction x8 fractions  . Hyperlipidemia   . Hypothyroidism   . Internal hemorrhoids   . Osteoarthritis    . Osteopenia   . Positive ANA (antinuclear antibody)   . Rash    arms and legs caused by skin scleroderma  . Raynaud's syndrome   . Scleroderma involving lung ([place]) pulmologist-  dr h. Ruthann Cancer at [facility]   systemic sclerosis , diffuse/   rheumotologist:  [facility]uella [facility] at [facility]---  [facility] 07-22-2017 (as of 07-25-2017 note not available to read)---  per pt has appt. w/ specialist at [facility]  . Skin thickening    hands, arms, legs  caused by scleroderma  . Systemic sclerosis [facility])     Past Surgical History:  Procedure Laterality Date  . BIOPSY  01/29/2018   Procedure: BIOPSY;  Surgeon: Jerene Bears, MD;  [facility];  Service: Gastroenterology;;  . Lillard Anes  2000   Left  . COLONOSCOPY WITH PROPOFOL N/A 01/29/2018   Procedure: COLONOSCOPY WITH PROPOFOL;  Surgeon: Jerene Bears, MD;  [facility];  Service: Gastroenterology;  Laterality: N/A;  . DILATATION & CURETTAGE/HYSTEROSCOPY WITH MYOSURE N/A 08/04/2017   Procedure: DILATATION & CURETTAGE/HYSTEROSCOPY WITH MYOSURE;  Surgeon: Megan Salon, MD;  Location: [place];  Service: Gynecology;  Laterality: N/A;  . ECTOPIC PREGNANCY SURGERY  1986-88   s/p unilateral salpingectomy  . ESOPHAGOGASTRODUODENOSCOPY (EGD) WITH PROPOFOL N/A 01/29/2018   Procedure: ESOPHAGOGASTRODUODENOSCOPY (EGD) WITH PROPOFOL;  Surgeon: Jerene Bears, MD;  [facility];  Service: Gastroenterology;  Laterality: N/A;  . PARTIAL MASTECTOMY WITH AXILLARY SENTINEL LYMPH NODE BIOPSY Left 11-04-2011   dr Margot Chimes  [facility]  . TRANSTHORACIC ECHOCARDIOGRAM  06-04-2017    [facility]   moderate LVH, ef>55%/  trivial AR and TR/ mild MR and PR/  trivial pericardial effusion  . WISDOM TOOTH EXTRACTION      Current Outpatient Medications  Medication Sig Dispense Refill  . Doxepin HCl 5 % CREA Apply 1 application topically 3 (three) times daily as needed (itch).   3  . ELDERBERRY PO Take by mouth 2 (two) times daily.    . furosemide (LASIX) 20  MG tablet Take 40 mg by mouth daily.     [name] gabapentin (NEURONTIN) 100 MG capsule Take 100-200 mg by mouth See admin instructions. Take 142m by mouth in the morning and afternoon and 203mat night  3  . meloxicam (MOBIC) 15 MG tablet Take 15 mg by mouth daily.     . mycophenolate (CELLCEPT) 500 MG tablet Take by mouth.    . pantoprazole (PROTONIX) 40 MG tablet TAKE 1 TABLET BY MOUTH TWICE DAILY BEFORE A MEAL 180 tablet 1  . SYNTHROID 100 MCG tablet Take 100 mcg by mouth daily before breakfast.     . tamoxifen (NOLVADEX) 20 MG tablet TAKE ONE TABLET BY MOUTH ONE TIME DAILY  90 tablet 0  . mirabegron ER (MYRBETRIQ) 25 MG TB24 tablet Take 1 tablet (25 mg total) by mouth daily. 30 tablet 2   No current facility-administered medications for this visit.     Family History  Problem Relation Age of Onset  . Heart disease Mother   . Heart failure Mother   . Heart disease Father   . Ovarian cancer Maternal Aunt   . Heart disease Paternal Uncle        multiple  . Autoimmune disease Sister   . Arrhythmia Sister   . Anemia Sister   . CAD Brother   . Colon cancer Neg Hx   . Stomach cancer Neg Hx   . Esophageal cancer Neg Hx   . Rectal cancer Neg Hx     Review of Systems  All other systems reviewed and are negative.   Exam:   BP 122/64   Pulse 74   Resp 14   Ht 5' 3.75" (1.619 m)   Wt 149 lb (67.6 kg)   BMI 25.78 kg/m    Height: 5' 3.75" (161.9 cm)  Ht Readings from Last 3 Encounters:  12/28/18 5' 3.75" (1.619 m)  09/25/18 '5\' 4"'  (1.626 m)  09/16/18 '5\' 4"'  (1.626 m)    General appearance: alert, cooperative and appears stated age Head: Normocephalic, without obvious abnormality, atraumatic Neck: no adenopathy, supple, symmetrical, trachea midline and thyroid normal to inspection and palpation Lungs: clear to auscultation bilaterally Breasts: normal appearance, no masses or tenderness Heart: regular rate and rhythm Abdomen: soft, non-tender; bowel sounds normal; no masses,  no  organomegaly Extremities: extremities normal, atraumatic, no cyanosis or edema Skin: Skin color, texture, turgor normal. No rashes or lesions Lymph nodes: Cervical, supraclavicular, and axillary nodes normal. No abnormal inguinal nodes palpated Neurologic: Grossly normal   Pelvic: External genitalia:  no lesions              Urethra:  normal appearing urethra with no masses, tenderness or lesions              Bartholins and Skenes: normal  Vagina: normal appearing vagina with normal color and discharge, no lesions              Cervix: no lesions              Pap taken: Yes.   Bimanual Exam:  Uterus:  normal size, contour, position, consistency, mobility, non-tender              Adnexa: normal adnexa and no mass, fullness, tenderness               Rectovaginal: Confirms               Anus:  normal sphincter tone, no lesions  Chaperone was present for exam.  A:  Scleroderm with increased cancer risks Raynaud's OAB H/O UTI in the past year  P:   She is planning breast MRI this year.  Will not have further MMGs due to radiation risks and recommendations from rheuamtolgist pap smear obtained today due to immunosuppression use Tdap update today Will try myrbetriq 50m daily.  #30/3RF.  She will give me an update.  Return annually or prn  ~25 minutes spent with patient >50% of time was in face to face discussion of above.

## 2018-12-30 DIAGNOSIS — M79646 Pain in unspecified finger(s): Secondary | ICD-10-CM | POA: Diagnosis not present

## 2018-12-30 DIAGNOSIS — M79643 Pain in unspecified hand: Secondary | ICD-10-CM | POA: Diagnosis not present

## 2018-12-30 DIAGNOSIS — M349 Systemic sclerosis, unspecified: Secondary | ICD-10-CM | POA: Diagnosis not present

## 2018-12-30 DIAGNOSIS — M25642 Stiffness of left hand, not elsewhere classified: Secondary | ICD-10-CM | POA: Diagnosis not present

## 2018-12-30 DIAGNOSIS — R29898 Other symptoms and signs involving the musculoskeletal system: Secondary | ICD-10-CM | POA: Diagnosis not present

## 2018-12-30 DIAGNOSIS — M25641 Stiffness of right hand, not elsewhere classified: Secondary | ICD-10-CM | POA: Diagnosis not present

## 2019-01-04 ENCOUNTER — Other Ambulatory Visit (HOSPITAL_COMMUNITY)
Admission: RE | Admit: 2019-01-04 | Discharge: 2019-01-04 | Disposition: A | Discharge status: Home / Self Care | Payer: Medicare Other | Source: Ambulatory Visit | Attending: Obstetrics & Gynecology | Admitting: Obstetrics & Gynecology

## 2019-01-04 DIAGNOSIS — Z124 Encounter for screening for malignant neoplasm of cervix: Secondary | ICD-10-CM | POA: Insufficient documentation

## 2019-01-04 NOTE — Addendum Note (Signed)
Addended by: Megan Salon on: 01/04/2019 01:56 PM   Modules accepted: Orders

## 2019-01-05 LAB — CYTOLOGY - PAP: Diagnosis: NEGATIVE

## 2019-01-06 ENCOUNTER — Other Ambulatory Visit: Payer: Self-pay

## 2019-01-06 DIAGNOSIS — Z170 Estrogen receptor positive status [ER+]: Secondary | ICD-10-CM

## 2019-01-06 DIAGNOSIS — C50412 Malignant neoplasm of upper-outer quadrant of left female breast: Secondary | ICD-10-CM

## 2019-01-07 DIAGNOSIS — M79643 Pain in unspecified hand: Secondary | ICD-10-CM | POA: Diagnosis not present

## 2019-01-07 DIAGNOSIS — M349 Systemic sclerosis, unspecified: Secondary | ICD-10-CM | POA: Diagnosis not present

## 2019-01-07 DIAGNOSIS — M79646 Pain in unspecified finger(s): Secondary | ICD-10-CM | POA: Diagnosis not present

## 2019-01-07 DIAGNOSIS — M25641 Stiffness of right hand, not elsewhere classified: Secondary | ICD-10-CM | POA: Diagnosis not present

## 2019-01-07 DIAGNOSIS — R29898 Other symptoms and signs involving the musculoskeletal system: Secondary | ICD-10-CM | POA: Diagnosis not present

## 2019-01-07 DIAGNOSIS — M25642 Stiffness of left hand, not elsewhere classified: Secondary | ICD-10-CM | POA: Diagnosis not present

## 2019-01-13 DIAGNOSIS — M79646 Pain in unspecified finger(s): Secondary | ICD-10-CM | POA: Diagnosis not present

## 2019-01-13 DIAGNOSIS — M25642 Stiffness of left hand, not elsewhere classified: Secondary | ICD-10-CM | POA: Diagnosis not present

## 2019-01-13 DIAGNOSIS — M349 Systemic sclerosis, unspecified: Secondary | ICD-10-CM | POA: Diagnosis not present

## 2019-01-13 DIAGNOSIS — M79643 Pain in unspecified hand: Secondary | ICD-10-CM | POA: Diagnosis not present

## 2019-01-13 DIAGNOSIS — R29898 Other symptoms and signs involving the musculoskeletal system: Secondary | ICD-10-CM | POA: Diagnosis not present

## 2019-01-13 DIAGNOSIS — M25641 Stiffness of right hand, not elsewhere classified: Secondary | ICD-10-CM | POA: Diagnosis not present

## 2019-01-24 ENCOUNTER — Encounter: Payer: Self-pay | Admitting: Obstetrics & Gynecology

## 2019-01-25 ENCOUNTER — Telehealth: Payer: Self-pay | Admitting: Obstetrics & Gynecology

## 2019-01-25 DIAGNOSIS — M79643 Pain in unspecified hand: Secondary | ICD-10-CM | POA: Diagnosis not present

## 2019-01-25 DIAGNOSIS — M25642 Stiffness of left hand, not elsewhere classified: Secondary | ICD-10-CM | POA: Diagnosis not present

## 2019-01-25 DIAGNOSIS — M79646 Pain in unspecified finger(s): Secondary | ICD-10-CM | POA: Diagnosis not present

## 2019-01-25 DIAGNOSIS — E038 Other specified hypothyroidism: Secondary | ICD-10-CM | POA: Diagnosis not present

## 2019-01-25 DIAGNOSIS — R29898 Other symptoms and signs involving the musculoskeletal system: Secondary | ICD-10-CM | POA: Diagnosis not present

## 2019-01-25 DIAGNOSIS — M25641 Stiffness of right hand, not elsewhere classified: Secondary | ICD-10-CM | POA: Diagnosis not present

## 2019-01-25 DIAGNOSIS — M349 Systemic sclerosis, unspecified: Secondary | ICD-10-CM | POA: Diagnosis not present

## 2019-01-25 NOTE — Telephone Encounter (Signed)
She can start taking it every other day and see how her bladder function is with the every other day dosing.  If worsens, then she knows she will just need daily dosing.  Thanks.

## 2019-01-25 NOTE — Telephone Encounter (Signed)
Spoke with patient, advised as seen below per Dr. Miller. Patient verbalizes understanding and is agreeable.   Encounter closed.  

## 2019-01-25 NOTE — Telephone Encounter (Signed)
Message  Dr. Sabra Heck,    [facility] you had a wonderful 4th!    When I was in to see you in June your prescribed Myrbetriq 25mg  for me. You mentioned something about taking the med for 3 weeks solid and see how I was doing. At that time, I might be able to cut back on the number of days I am taking it. Problem is.Marland KitchenMarland KitchenI do not remember what you suggested. I am doing well on the medicine but would welcome trying to cut back . Please let me know what you suggest.    [name]  [birth year]  [phone]

## 2019-01-25 NOTE — Telephone Encounter (Signed)
Patient seen in office on 12/28/18.   Routing to Dr. Sabra Heck to advise on Myrbetriq.

## 2019-01-27 DIAGNOSIS — Z0000 Encounter for general adult medical examination without abnormal findings: Secondary | ICD-10-CM | POA: Diagnosis not present

## 2019-01-27 DIAGNOSIS — Z1339 Encounter for screening examination for other mental health and behavioral disorders: Secondary | ICD-10-CM | POA: Diagnosis not present

## 2019-01-27 DIAGNOSIS — R1310 Dysphagia, unspecified: Secondary | ICD-10-CM | POA: Diagnosis not present

## 2019-01-27 DIAGNOSIS — C50919 Malignant neoplasm of unspecified site of unspecified female breast: Secondary | ICD-10-CM | POA: Diagnosis not present

## 2019-01-27 DIAGNOSIS — G629 Polyneuropathy, unspecified: Secondary | ICD-10-CM | POA: Diagnosis not present

## 2019-01-27 DIAGNOSIS — M349 Systemic sclerosis, unspecified: Secondary | ICD-10-CM | POA: Diagnosis not present

## 2019-01-27 DIAGNOSIS — F3342 Major depressive disorder, recurrent, in full remission: Secondary | ICD-10-CM | POA: Diagnosis not present

## 2019-01-27 DIAGNOSIS — J849 Interstitial pulmonary disease, unspecified: Secondary | ICD-10-CM | POA: Diagnosis not present

## 2019-01-27 DIAGNOSIS — D649 Anemia, unspecified: Secondary | ICD-10-CM | POA: Diagnosis not present

## 2019-01-27 DIAGNOSIS — D8989 Other specified disorders involving the immune mechanism, not elsewhere classified: Secondary | ICD-10-CM | POA: Diagnosis not present

## 2019-01-27 DIAGNOSIS — R7989 Other specified abnormal findings of blood chemistry: Secondary | ICD-10-CM | POA: Diagnosis not present

## 2019-01-27 DIAGNOSIS — E039 Hypothyroidism, unspecified: Secondary | ICD-10-CM | POA: Diagnosis not present

## 2019-01-27 DIAGNOSIS — Z1331 Encounter for screening for depression: Secondary | ICD-10-CM | POA: Diagnosis not present

## 2019-01-27 DIAGNOSIS — M8580 Other specified disorders of bone density and structure, unspecified site: Secondary | ICD-10-CM | POA: Diagnosis not present

## 2019-01-27 DIAGNOSIS — N183 Chronic kidney disease, stage 3 (moderate): Secondary | ICD-10-CM | POA: Diagnosis not present

## 2019-02-06 ENCOUNTER — Other Ambulatory Visit: Payer: Self-pay | Admitting: Oncology

## 2019-02-06 ENCOUNTER — Other Ambulatory Visit: Payer: Self-pay

## 2019-02-06 ENCOUNTER — Ambulatory Visit
Admission: RE | Admit: 2019-02-06 | Discharge: 2019-02-06 | Disposition: A | Discharge status: Home / Self Care | Payer: Medicare Other | Source: Ambulatory Visit | Attending: Oncology | Admitting: Oncology

## 2019-02-06 DIAGNOSIS — L940 Localized scleroderma [morphea]: Secondary | ICD-10-CM | POA: Diagnosis not present

## 2019-02-06 DIAGNOSIS — Z1239 Encounter for other screening for malignant neoplasm of breast: Secondary | ICD-10-CM | POA: Diagnosis not present

## 2019-02-06 DIAGNOSIS — C50412 Malignant neoplasm of upper-outer quadrant of left female breast: Secondary | ICD-10-CM

## 2019-02-06 DIAGNOSIS — Z170 Estrogen receptor positive status [ER+]: Secondary | ICD-10-CM

## 2019-02-06 DIAGNOSIS — Z853 Personal history of malignant neoplasm of breast: Secondary | ICD-10-CM | POA: Diagnosis not present

## 2019-02-06 MED ORDER — GADOBUTROL 1 MMOL/ML IV SOLN
7.0000 mL | Freq: Once | INTRAVENOUS | Status: AC | PRN
  Administered 2019-02-06: 7 mL via INTRAVENOUS

## 2019-02-08 DIAGNOSIS — M349 Systemic sclerosis, unspecified: Secondary | ICD-10-CM | POA: Diagnosis not present

## 2019-02-08 DIAGNOSIS — M79646 Pain in unspecified finger(s): Secondary | ICD-10-CM | POA: Diagnosis not present

## 2019-02-08 DIAGNOSIS — M25642 Stiffness of left hand, not elsewhere classified: Secondary | ICD-10-CM | POA: Diagnosis not present

## 2019-02-08 DIAGNOSIS — M25641 Stiffness of right hand, not elsewhere classified: Secondary | ICD-10-CM | POA: Diagnosis not present

## 2019-02-08 DIAGNOSIS — R82998 Other abnormal findings in urine: Secondary | ICD-10-CM | POA: Diagnosis not present

## 2019-02-08 DIAGNOSIS — M79643 Pain in unspecified hand: Secondary | ICD-10-CM | POA: Diagnosis not present

## 2019-02-08 DIAGNOSIS — R29898 Other symptoms and signs involving the musculoskeletal system: Secondary | ICD-10-CM | POA: Diagnosis not present

## 2019-02-15 ENCOUNTER — Inpatient Hospital Stay: Payer: Medicare Other | Attending: Oncology

## 2019-02-15 ENCOUNTER — Inpatient Hospital Stay (HOSPITAL_BASED_OUTPATIENT_CLINIC_OR_DEPARTMENT_OTHER): Payer: Medicare Other | Admitting: Oncology

## 2019-02-15 ENCOUNTER — Other Ambulatory Visit: Payer: Self-pay

## 2019-02-15 VITALS — BP 121/61 | HR 86 | Temp 98.2°F | Resp 18 | Ht 63.75 in | Wt 151.7 lb

## 2019-02-15 DIAGNOSIS — Z170 Estrogen receptor positive status [ER+]: Secondary | ICD-10-CM | POA: Diagnosis not present

## 2019-02-15 DIAGNOSIS — C50412 Malignant neoplasm of upper-outer quadrant of left female breast: Secondary | ICD-10-CM

## 2019-02-15 DIAGNOSIS — E785 Hyperlipidemia, unspecified: Secondary | ICD-10-CM | POA: Insufficient documentation

## 2019-02-15 DIAGNOSIS — Z86010 Personal history of colonic polyps: Secondary | ICD-10-CM | POA: Insufficient documentation

## 2019-02-15 DIAGNOSIS — C50912 Malignant neoplasm of unspecified site of left female breast: Secondary | ICD-10-CM | POA: Insufficient documentation

## 2019-02-15 DIAGNOSIS — M349 Systemic sclerosis, unspecified: Secondary | ICD-10-CM | POA: Diagnosis not present

## 2019-02-15 DIAGNOSIS — K219 Gastro-esophageal reflux disease without esophagitis: Secondary | ICD-10-CM | POA: Diagnosis not present

## 2019-02-15 DIAGNOSIS — M1990 Unspecified osteoarthritis, unspecified site: Secondary | ICD-10-CM | POA: Diagnosis not present

## 2019-02-15 DIAGNOSIS — F329 Major depressive disorder, single episode, unspecified: Secondary | ICD-10-CM | POA: Insufficient documentation

## 2019-02-15 DIAGNOSIS — Z923 Personal history of irradiation: Secondary | ICD-10-CM | POA: Diagnosis not present

## 2019-02-15 DIAGNOSIS — R21 Rash and other nonspecific skin eruption: Secondary | ICD-10-CM | POA: Insufficient documentation

## 2019-02-15 DIAGNOSIS — Z79899 Other long term (current) drug therapy: Secondary | ICD-10-CM | POA: Diagnosis not present

## 2019-02-15 DIAGNOSIS — Z8041 Family history of malignant neoplasm of ovary: Secondary | ICD-10-CM | POA: Insufficient documentation

## 2019-02-15 DIAGNOSIS — K449 Diaphragmatic hernia without obstruction or gangrene: Secondary | ICD-10-CM | POA: Insufficient documentation

## 2019-02-15 DIAGNOSIS — I7300 Raynaud's syndrome without gangrene: Secondary | ICD-10-CM | POA: Insufficient documentation

## 2019-02-15 DIAGNOSIS — R232 Flushing: Secondary | ICD-10-CM | POA: Diagnosis not present

## 2019-02-15 DIAGNOSIS — M8580 Other specified disorders of bone density and structure, unspecified site: Secondary | ICD-10-CM | POA: Diagnosis not present

## 2019-02-15 DIAGNOSIS — Z79810 Long term (current) use of selective estrogen receptor modulators (SERMs): Secondary | ICD-10-CM | POA: Diagnosis not present

## 2019-02-15 DIAGNOSIS — E039 Hypothyroidism, unspecified: Secondary | ICD-10-CM | POA: Insufficient documentation

## 2019-02-15 LAB — CBC WITH DIFFERENTIAL/PLATELET
Abs Immature Granulocytes: 0.01 10*3/uL (ref 0.00–0.07)
Basophils Absolute: 0.1 10*3/uL (ref 0.0–0.1)
Basophils Relative: 1 %
Eosinophils Absolute: 0.2 10*3/uL (ref 0.0–0.5)
Eosinophils Relative: 2 %
HCT: 30.3 % — ABNORMAL LOW (ref 36.0–46.0)
Hemoglobin: 9.3 g/dL — ABNORMAL LOW (ref 12.0–15.0)
Immature Granulocytes: 0 %
Lymphocytes Relative: 13 %
Lymphs Abs: 0.9 10*3/uL (ref 0.7–4.0)
MCH: 28.7 pg (ref 26.0–34.0)
MCHC: 30.7 g/dL (ref 30.0–36.0)
MCV: 93.5 fL (ref 80.0–100.0)
Monocytes Absolute: 0.6 10*3/uL (ref 0.1–1.0)
Monocytes Relative: 9 %
Neutro Abs: 4.9 10*3/uL (ref 1.7–7.7)
Neutrophils Relative %: 75 %
Platelets: 235 10*3/uL (ref 150–400)
RBC: 3.24 MIL/uL — ABNORMAL LOW (ref 3.87–5.11)
RDW: 13.8 % (ref 11.5–15.5)
WBC: 6.6 10*3/uL (ref 4.0–10.5)
nRBC: 0 % (ref 0.0–0.2)

## 2019-02-15 LAB — COMPREHENSIVE METABOLIC PANEL
ALT: 13 U/L (ref 0–44)
AST: 19 U/L (ref 15–41)
Albumin: 3.7 g/dL (ref 3.5–5.0)
Alkaline Phosphatase: 63 U/L (ref 38–126)
Anion gap: 8 (ref 5–15)
BUN: 13 mg/dL (ref 8–23)
CO2: 24 mmol/L (ref 22–32)
Calcium: 9.1 mg/dL (ref 8.9–10.3)
Chloride: 107 mmol/L (ref 98–111)
Creatinine, Ser: 1.24 mg/dL — ABNORMAL HIGH (ref 0.44–1.00)
GFR calc Af Amer: 51 mL/min — ABNORMAL LOW (ref >60)
GFR calc non Af Amer: 44 mL/min — ABNORMAL LOW (ref >60)
Glucose, Bld: 100 mg/dL — ABNORMAL HIGH (ref 70–99)
Potassium: 4.1 mmol/L (ref 3.5–5.1)
Sodium: 139 mmol/L (ref 135–145)
Total Bilirubin: 0.2 mg/dL — ABNORMAL LOW (ref 0.3–1.2)
Total Protein: 6.3 g/dL — ABNORMAL LOW (ref 6.5–8.1)

## 2019-02-15 MED ORDER — TAMOXIFEN CITRATE 10 MG/5ML PO SOLN: 20.0000 mg | Freq: Every day | ORAL | 12 refills | Status: DC

## 2019-02-15 NOTE — Progress Notes (Signed)
ID: [name]   DOB: May 08, 1951  MR#: [id number]  BOF#:[id number]   PCP: Marton Redwood, MD Patient Care Team: Marton Redwood, MD as PCP - General (Internal Medicine) [name], Virgie Dad, MD (Hematology and Oncology) Alda Berthold, DO as Consulting Physician (Neurology) Rod Holler, MD Lucy Antigua, MD as Referring Physician (Rheumatology) Charlie Pitter, MD as Referring Physician (Pulmonary Disease) Megan Salon, MD as Consulting Physician (Gynecology) Artis Delay, MD as Referring Physician (Internal Medicine) Jarome Matin, MD as Consulting Physician (Dermatology)   CHIEF COMPLAINT: Estrogen receptor positive breast cancer  CURRENT TREATMENT: Tamoxifen; scleroderma   INTERVAL HISTORY: [name] returns today for follow-up and treatment of her estrogen receptor positive breast cancer.  She continues on tamoxifen. She notes that she has some difficulty swallowing the tamoxifen as it is somewhat chalky. She has some mild hot flashes.   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  Accordingly she is followed with mammography but with breast MRI.    Since her last visit here, she underwent a bilateral breast MRI with a without contrast on 02/06/2019 showing: Breast Density Category C. There is no MRI malignancy in either breast. She did not have to pay anything out of pocket for this.   She has an upcoming appointment at [facility] with Dr. Artis Delay on 02/23/2019 to discuss her scleroderma.  She is also followed at [place]:  [name] has been taking appropriate precautions against the spread of the pandemic. She continues to have pain from her scleroderma. The patient denies unusual headaches, visual changes, nausea, vomiting, or dizziness. There has been no unusual cough, phlegm production, or pleurisy; she notes some occasional cough from allergy based nasal drainage however. This been no change in  bowel or bladder habits. The patient denies unexplained fatigue or unexplained weight loss, bleeding, rash, or fever. A detailed review of systems was otherwise noncontributory.    BREAST CANCER HISTORY: From the original intake note:  The patient had routine screening mammography at [facility] 08/27/2011, suggesting a potential abnormality in the left breast. Additional views 08/29/2011 confirmed an irregular mass with architectural or distortion measuring up to 4 cm in the left breast, without associated microcalcifications. By ultrasound this was irregular, and showed posterior shadowing. It measured 3.6 cm sonographically. There were no suspicious lymph nodes noted in the left axilla.  The same day the patient underwent ultrasound-guided biopsy of the left breast mass, showing ([id number]) and invasive ductal carcinoma, grade 1, which was 100% estrogen receptor and 97% progesterone receptor positive. The proliferation marker was 20%. There was no HER-2 amplification with a ratio by CISH of 1.09.  The patient had bilateral breast MRI 09/03/2011 showing a solitary 3.8 cm left breast lesion, and on 11/04/2011 underwent left lumpectomy and sentinel lymph node sampling with results and subsequent history as detailed below.   PAST MEDICAL HISTORY: Past Medical History:  Diagnosis Date  . Anemia   . 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 Colon polyp   . 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 Depression   . Esophageal stricture   . GERD (gastroesophageal reflux disease)   .  Hiatal hernia   . History of external beam radiation therapy 11-25-2011 to 01-08-2012   left breast 45 Gy at  1.8 per fraction x25 fractions, boost 16 Gy at 2 per fraction x8 fractions  . Hyperlipidemia   . Hypothyroidism   . Internal hemorrhoids   . Osteoarthritis   . Osteopenia   . Positive ANA (antinuclear antibody)   . Rash    arms and legs caused by skin scleroderma  . Raynaud's syndrome   . 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 Ruthann Cancer at [facility]   systemic sclerosis , diffuse/   rheumotologist:  dr Manuella Ghazi at [facility]---  [place] 07-22-2017 (as of 07-25-2017 note not available to read)---  per pt has appt. w/ specialist at [facility]  . Skin thickening    hands, arms, legs  caused by scleroderma  . Systemic sclerosis ([place])     PAST SURGICAL HISTORY: Past Surgical History:  Procedure Laterality Date  . BIOPSY  01/29/2018   Procedure: BIOPSY;  Surgeon: Jerene Bears, MD;  Location: [facility];  Service: Gastroenterology;;  . Lillard Anes  2000   Left  . COLONOSCOPY WITH PROPOFOL N/A 01/29/2018   Procedure: COLONOSCOPY WITH PROPOFOL;  Surgeon: Jerene Bears, MD;  Location: [facility];  Service: Gastroenterology;  Laterality: N/A;  . DILATATION & CURETTAGE/HYSTEROSCOPY WITH MYOSURE N/A 08/04/2017   Procedure: DILATATION & CURETTAGE/HYSTEROSCOPY WITH MYOSURE;  Surgeon: Megan Salon, MD;  Location: [facility];  Service: Gynecology;  Laterality: N/A;  . ECTOPIC PREGNANCY SURGERY  1986-88   s/p unilateral salpingectomy  . ESOPHAGOGASTRODUODENOSCOPY (EGD) WITH PROPOFOL N/A 01/29/2018   Procedure: ESOPHAGOGASTRODUODENOSCOPY (EGD) WITH PROPOFOL;  Surgeon: Jerene Bears, MD;  Location: [facility];  Service: Gastroenterology;  Laterality: N/A;  . PARTIAL MASTECTOMY WITH AXILLARY SENTINEL LYMPH NODE BIOPSY Left 11-04-2011   dr Margot Chimes  [facility]  . TRANSTHORACIC ECHOCARDIOGRAM  06-04-2017    [facility]   moderate LVH, ef>55%/  trivial AR and TR/ mild MR and PR/  trivial pericardial effusion  . WISDOM TOOTH EXTRACTION      FAMILY HISTORY Family History  Problem Relation Age of  Onset  . Heart disease Mother   . Heart failure Mother   . Heart disease Father   . Ovarian cancer Maternal Aunt   . Heart disease Paternal Uncle        multiple  . Autoimmune disease Sister   . Arrhythmia Sister   . Anemia Sister   . CAD Brother   . Colon cancer Neg Hx   . Stomach cancer Neg Hx   . Esophageal cancer Neg Hx   . Rectal cancer Neg Hx    The patient's father died at the age of 24 from myocardial infarction. The patient's mother died from congestive heart failure at the age of 107. The patient has one brother age 15 with prostate cancer diagnosed at age 48. The patient's sister is 40 as of 2013. There is no breast or ovarian cancer in the family   GYNECOLOGIC HISTORY:  (Reviewed 12/27/2013) She had menarche at around age 9. She is GX P1, first pregnancy to term age 23. She stopped having periods about 2003. She did not use hormone replacement   SOCIAL HISTORY:  (Updated 02/15/2019). 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 Her husband [name]  has had prostate cancer, status post radiation treatments in [place]  about 7 years ago. Her daughter [name] lives in [place] with her husband and 2 children. 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 The patient attends the [employer].   ADVANCED DIRECTIVES: in place    HEALTH MAINTENANCE: (Updated 12/27/2013) Social History   Tobacco Use  . Smoking status: Never Smoker  . Smokeless tobacco: Never Used  Substance Use Topics  . Alcohol use: Yes    Comment: Drinks one mixed drink nightly  . Drug use: No     Colonoscopy: repeat due 2015  PAP: UTD ([facility])  Bone density: June 2013, osteopenia with T score -1.4  Lipid panel: June 2014  Allergies  Allergen Reactions  . Betadine [Povidone Iodine] Rash  . Contrast Media [Iodinated Diagnostic Agents] Rash     "per pt recently had CT 08/ 2018 even through given pre-medication, IVP still caused rash"  . Epinephrine Other (See Comments)    Sever headaches  . Oxycodone Anxiety  . Nickel Rash and Other (See Comments)    Rash and blisters  . Other Rash and Other (See Comments)    Hospital gown  . Penicillins Rash and Other (See Comments)    Rash and blisters Has patient had a PCN reaction causing immediate rash, facial/tongue/throat swelling, SOB or lightheadedness with hypotension: Yes Has patient had a PCN reaction causing severe rash involving mucus membranes or skin necrosis: No Has patient had a PCN reaction that required hospitalization: No Has patient had a PCN reaction occurring within the last 10 years: No If all of the above answers are "NO", then may proceed with Cephalosporin use.   . Sulfa Antibiotics Rash    Current Outpatient Medications  Medication Sig Dispense Refill  . Doxepin HCl 5 % CREA Apply 1 application topically 3 (three) times daily as needed (itch).   3  . ELDERBERRY PO Take by mouth 2 (two) times daily.    . furosemide (LASIX) 20 MG tablet Take 40 mg by mouth daily.     [name] gabapentin (NEURONTIN) 100 MG capsule Take 100-200 mg by mouth See admin instructions. Take 168m by mouth in the morning and afternoon and 2040mat night  3  . meloxicam (MOBIC) 15 MG tablet Take 15 mg by mouth daily.     . mirabegron ER (MYRBETRIQ) 25 MG TB24 tablet Take 1 tablet (25 mg total) by mouth daily. 30 tablet 2  . mycophenolate (CELLCEPT) 500 MG tablet Take by mouth.    . pantoprazole (PROTONIX) 40 MG tablet TAKE 1 TABLET BY MOUTH TWICE DAILY BEFORE A MEAL 180 tablet 1  . SYNTHROID 100 MCG tablet Take 100 mcg by mouth daily before breakfast.     . tamoxifen (NOLVADEX) 20 MG tablet TAKE ONE TABLET BY MOUTH ONE TIME DAILY  90 tablet 0  . tamoxifen (SOLTAMOX) 10 MG/5ML solution Take 10 mLs (20 mg total) by mouth daily. 300 mL 12   No current facility-administered medications for this visit.      OBJECTIVE: Middle-aged white woman in no acute distress  Vitals:   02/15/19 1019  BP: 121/61  Pulse: 86  Resp: 18  Temp: 98.2 F (36.8 C)  SpO2: 100%     Body mass index is 26.24 kg/m.    ECOG FS: 1 Filed Weights   02/15/19 1019  Weight: 151 lb 11.2 oz (68.8 kg)    Sclerae unicteric, EOMs intact Wearing a mask No cervical or supraclavicular adenopathy Lungs no rales or rhonchi Heart regular rate and  rhythm Abd soft, nontender, positive bowel sounds MSK no focal spinal tenderness, no upper extremity lymphedema Neuro: nonfocal, well oriented, appropriate affect Breasts: The right breast is unremarkable.  The left breast is very firm, with no lumps however, and the nipple is slightly retracted.  This is imaged below.  The axillae are benign.  02/15/2019 Left Breast      LAB RESULTS: Lab Results  Component Value Date   WBC 6.6 02/15/2019   NEUTROABS 4.9 02/15/2019   HGB 9.3 (L) 02/15/2019   HCT 30.3 (L) 02/15/2019   MCV 93.5 02/15/2019   PLT 235 02/15/2019      Chemistry      Component Value Date/Time   NA 139 02/15/2019 1000   NA 141 06/09/2017 1117   K 4.1 02/15/2019 1000   K 3.9 06/09/2017 1117   CL 107 02/15/2019 1000   CL 109 (H) 12/14/2012 1253   CO2 24 02/15/2019 1000   CO2 23 06/09/2017 1117   BUN 13 02/15/2019 1000   BUN 11.5 06/09/2017 1117   CREATININE 1.24 (H) 02/15/2019 1000   CREATININE 1.2 (H) 06/09/2017 1117      Component Value Date/Time   CALCIUM 9.1 02/15/2019 1000   CALCIUM 8.9 06/09/2017 1117   ALKPHOS 63 02/15/2019 1000   ALKPHOS 40 06/09/2017 1117   AST 19 02/15/2019 1000   AST 35 (H) 06/09/2017 1117   ALT 13 02/15/2019 1000   ALT 19 06/09/2017 1117   BILITOT 0.2 (L) 02/15/2019 1000   BILITOT 0.37 06/09/2017 1117       STUDIES: Mr Breast Bilateral W Wo Contrast Inc Cad  Result Date: 02/08/2019 CLINICAL DATA:  History of treated left breast cancer, status post breast conservation therapy in 2013. patient has a  diagnosis of scleroderma and her physician the small 100 receive any additional radiation due to high risk of developing melanoma. Therefore, she has been followed with screening MRIs of the breast in lieu of screening mammography. LABS:  GFR 40 EXAM: BILATERAL BREAST MRI WITH AND WITHOUT CONTRAST TECHNIQUE: Multiplanar, multisequence MR images of both breasts were obtained prior to and following the intravenous administration of 7 ml of Gadavist Three-dimensional MR images were rendered by post-processing of the original MR data on an independent workstation. The three-dimensional MR images were interpreted, and findings are reported in the following complete MRI report for this study. Three dimensional images were evaluated at the independent DynaCad workstation COMPARISON:  Previous exam(s). FINDINGS: Breast composition: c. Heterogeneous fibroglandular tissue. Background parenchymal enhancement: Moderate. Right breast: No mass or abnormal enhancement. Left breast: No mass or abnormal enhancement. Stable posttreatment changes. Lymph nodes: No abnormal appearing lymph nodes. Ancillary findings:  None. IMPRESSION: No MRI evidence of malignancy in either breast status post left lumpectomy. RECOMMENDATION: Screening mammogram in one year.(Code:SM-B-01Y) Based on the recommendations of the [place], annual screening MRI is suggested in addition to annual mammography if the patient has an estimated lifetime risk of developing breast cancer which is greater than 20%. BI-RADS CATEGORY  2: Benign. Electronically Signed   By: Fidela Salisbury M.D.   On: 02/08/2019 10:38      ASSESSMENT: 68 y.o.  [name] woman   (1)  status post left lumpectomy 11/04/2011 for a pT2 pN0, stage IIA invasive ductal carcinoma, grade 2, strongly estrogen and progesterone receptor positive, HER-2 negative, with an MIB-1 of 20%.   (2)  completed radiation treatments June 2013    (3)  started tamoxifen mid-July 2013,  to be continued  until 2023  (a) endometrial thickening, status post multiple biopsies 04/04/2017 showing no atypia dysplasia or malignancy  (b) D&C with hysteroscopy 08/04/2017 showed inactive endometrium and a benign polyp  (4) breast density category C: followed with yearly MRI (due to scleroderma)  (a) status post breast MRI in April 2015 showing 2 abnormalities in the right breast; biopsy of both spots 11/19/2013 showed only fibrocystic PASH   (5) osteopenia with a T score of -1.5 at the left femoral neck 04/18/2014  (a) on vitamin D supplementation, weightbearing exercise program, and tamoxifen  (6) scleroderma, with significant arthritis, thickened digits, lung changes, and swallowing difficulties   PLAN:  Akeyla is now a little over 7 years out from definitive surgery for her breast cancer with no evidence of disease recurrence.  This is very favorable.  She is tolerating tamoxifen generally quite well except for difficulty swallowing.  In the pain she could break the pill in half but she should not crush it.  I have written a prescription for tamoxifen solution, for her to take 10 cc daily.  If she has difficulty obtaining that or if it turns out to be very expensive she will let us know.  As noted before we are not obtaining mammography because of her scleroderma.  She will have a breast MRI again next year and she will see me shortly after that  The plan continues to be for tamoxifen for 10 years  She is taking appropriate pandemic precautions.  She knows to call for any other issue that may develop before her next visit.   [name], Virgie Dad, MD  02/15/19 10:59 AM Medical Oncology and Hematology [facility] [address] [place], [place] [address] Tel. [phone]    Fax. [phone]   I, Jacqualyn Posey am acting as a Education administrator for Chauncey Cruel, MD.   I, Lurline Del MD, have reviewed the above documentation for accuracy and completeness, and I agree  with the above.

## 2019-02-16 ENCOUNTER — Telehealth: Payer: Self-pay | Admitting: Oncology

## 2019-02-16 NOTE — Telephone Encounter (Signed)
I talk with patient regarding schedule  

## 2019-02-18 DIAGNOSIS — M25642 Stiffness of left hand, not elsewhere classified: Secondary | ICD-10-CM | POA: Diagnosis not present

## 2019-02-18 DIAGNOSIS — M25641 Stiffness of right hand, not elsewhere classified: Secondary | ICD-10-CM | POA: Diagnosis not present

## 2019-02-18 DIAGNOSIS — M79646 Pain in unspecified finger(s): Secondary | ICD-10-CM | POA: Diagnosis not present

## 2019-02-18 DIAGNOSIS — R29898 Other symptoms and signs involving the musculoskeletal system: Secondary | ICD-10-CM | POA: Diagnosis not present

## 2019-02-18 DIAGNOSIS — M349 Systemic sclerosis, unspecified: Secondary | ICD-10-CM | POA: Diagnosis not present

## 2019-02-18 DIAGNOSIS — M79643 Pain in unspecified hand: Secondary | ICD-10-CM | POA: Diagnosis not present

## 2019-02-22 DIAGNOSIS — M79643 Pain in unspecified hand: Secondary | ICD-10-CM | POA: Diagnosis not present

## 2019-02-22 DIAGNOSIS — M25641 Stiffness of right hand, not elsewhere classified: Secondary | ICD-10-CM | POA: Diagnosis not present

## 2019-02-22 DIAGNOSIS — M25642 Stiffness of left hand, not elsewhere classified: Secondary | ICD-10-CM | POA: Diagnosis not present

## 2019-02-22 DIAGNOSIS — M349 Systemic sclerosis, unspecified: Secondary | ICD-10-CM | POA: Diagnosis not present

## 2019-02-22 DIAGNOSIS — M79646 Pain in unspecified finger(s): Secondary | ICD-10-CM | POA: Diagnosis not present

## 2019-02-22 DIAGNOSIS — R29898 Other symptoms and signs involving the musculoskeletal system: Secondary | ICD-10-CM | POA: Diagnosis not present

## 2019-02-23 DIAGNOSIS — N060 Isolated proteinuria with minor glomerular abnormality: Secondary | ICD-10-CM | POA: Diagnosis not present

## 2019-02-23 DIAGNOSIS — Z79899 Other long term (current) drug therapy: Secondary | ICD-10-CM | POA: Diagnosis not present

## 2019-02-23 DIAGNOSIS — J849 Interstitial pulmonary disease, unspecified: Secondary | ICD-10-CM | POA: Diagnosis not present

## 2019-02-23 DIAGNOSIS — K210 Gastro-esophageal reflux disease with esophagitis: Secondary | ICD-10-CM | POA: Diagnosis not present

## 2019-02-23 DIAGNOSIS — M349 Systemic sclerosis, unspecified: Secondary | ICD-10-CM | POA: Diagnosis not present

## 2019-02-25 DIAGNOSIS — N390 Urinary tract infection, site not specified: Secondary | ICD-10-CM | POA: Diagnosis not present

## 2019-02-25 DIAGNOSIS — R300 Dysuria: Secondary | ICD-10-CM | POA: Diagnosis not present

## 2019-03-04 DIAGNOSIS — M349 Systemic sclerosis, unspecified: Secondary | ICD-10-CM | POA: Diagnosis not present

## 2019-03-04 DIAGNOSIS — M25641 Stiffness of right hand, not elsewhere classified: Secondary | ICD-10-CM | POA: Diagnosis not present

## 2019-03-04 DIAGNOSIS — M79643 Pain in unspecified hand: Secondary | ICD-10-CM | POA: Diagnosis not present

## 2019-03-04 DIAGNOSIS — M25642 Stiffness of left hand, not elsewhere classified: Secondary | ICD-10-CM | POA: Diagnosis not present

## 2019-03-04 DIAGNOSIS — R29898 Other symptoms and signs involving the musculoskeletal system: Secondary | ICD-10-CM | POA: Diagnosis not present

## 2019-03-04 DIAGNOSIS — M79646 Pain in unspecified finger(s): Secondary | ICD-10-CM | POA: Diagnosis not present

## 2019-03-10 DIAGNOSIS — M25641 Stiffness of right hand, not elsewhere classified: Secondary | ICD-10-CM | POA: Diagnosis not present

## 2019-03-10 DIAGNOSIS — M349 Systemic sclerosis, unspecified: Secondary | ICD-10-CM | POA: Diagnosis not present

## 2019-03-10 DIAGNOSIS — M79643 Pain in unspecified hand: Secondary | ICD-10-CM | POA: Diagnosis not present

## 2019-03-10 DIAGNOSIS — M25642 Stiffness of left hand, not elsewhere classified: Secondary | ICD-10-CM | POA: Diagnosis not present

## 2019-03-10 DIAGNOSIS — R29898 Other symptoms and signs involving the musculoskeletal system: Secondary | ICD-10-CM | POA: Diagnosis not present

## 2019-03-10 DIAGNOSIS — M79646 Pain in unspecified finger(s): Secondary | ICD-10-CM | POA: Diagnosis not present

## 2019-03-12 DIAGNOSIS — R300 Dysuria: Secondary | ICD-10-CM | POA: Diagnosis not present

## 2019-03-12 DIAGNOSIS — N390 Urinary tract infection, site not specified: Secondary | ICD-10-CM | POA: Diagnosis not present

## 2019-03-18 DIAGNOSIS — M349 Systemic sclerosis, unspecified: Secondary | ICD-10-CM | POA: Diagnosis not present

## 2019-03-18 DIAGNOSIS — M25642 Stiffness of left hand, not elsewhere classified: Secondary | ICD-10-CM | POA: Diagnosis not present

## 2019-03-18 DIAGNOSIS — M25641 Stiffness of right hand, not elsewhere classified: Secondary | ICD-10-CM | POA: Diagnosis not present

## 2019-03-18 DIAGNOSIS — R29898 Other symptoms and signs involving the musculoskeletal system: Secondary | ICD-10-CM | POA: Diagnosis not present

## 2019-03-18 DIAGNOSIS — M79646 Pain in unspecified finger(s): Secondary | ICD-10-CM | POA: Diagnosis not present

## 2019-03-18 DIAGNOSIS — M79643 Pain in unspecified hand: Secondary | ICD-10-CM | POA: Diagnosis not present

## 2019-03-24 DIAGNOSIS — R29898 Other symptoms and signs involving the musculoskeletal system: Secondary | ICD-10-CM | POA: Diagnosis not present

## 2019-03-24 DIAGNOSIS — M25642 Stiffness of left hand, not elsewhere classified: Secondary | ICD-10-CM | POA: Diagnosis not present

## 2019-03-24 DIAGNOSIS — M349 Systemic sclerosis, unspecified: Secondary | ICD-10-CM | POA: Diagnosis not present

## 2019-03-24 DIAGNOSIS — M79646 Pain in unspecified finger(s): Secondary | ICD-10-CM | POA: Diagnosis not present

## 2019-03-24 DIAGNOSIS — M79643 Pain in unspecified hand: Secondary | ICD-10-CM | POA: Diagnosis not present

## 2019-03-24 DIAGNOSIS — M25641 Stiffness of right hand, not elsewhere classified: Secondary | ICD-10-CM | POA: Diagnosis not present

## 2019-04-05 DIAGNOSIS — L821 Other seborrheic keratosis: Secondary | ICD-10-CM | POA: Diagnosis not present

## 2019-04-05 DIAGNOSIS — Z85828 Personal history of other malignant neoplasm of skin: Secondary | ICD-10-CM | POA: Diagnosis not present

## 2019-04-05 DIAGNOSIS — D225 Melanocytic nevi of trunk: Secondary | ICD-10-CM | POA: Diagnosis not present

## 2019-04-05 DIAGNOSIS — R300 Dysuria: Secondary | ICD-10-CM | POA: Diagnosis not present

## 2019-04-05 DIAGNOSIS — N390 Urinary tract infection, site not specified: Secondary | ICD-10-CM | POA: Diagnosis not present

## 2019-04-06 ENCOUNTER — Other Ambulatory Visit: Payer: Self-pay | Admitting: Internal Medicine

## 2019-04-06 DIAGNOSIS — M25642 Stiffness of left hand, not elsewhere classified: Secondary | ICD-10-CM | POA: Diagnosis not present

## 2019-04-06 DIAGNOSIS — M25641 Stiffness of right hand, not elsewhere classified: Secondary | ICD-10-CM | POA: Diagnosis not present

## 2019-04-06 DIAGNOSIS — R319 Hematuria, unspecified: Secondary | ICD-10-CM

## 2019-04-06 DIAGNOSIS — M79643 Pain in unspecified hand: Secondary | ICD-10-CM | POA: Diagnosis not present

## 2019-04-06 DIAGNOSIS — M349 Systemic sclerosis, unspecified: Secondary | ICD-10-CM | POA: Diagnosis not present

## 2019-04-06 DIAGNOSIS — N390 Urinary tract infection, site not specified: Secondary | ICD-10-CM

## 2019-04-06 DIAGNOSIS — M79646 Pain in unspecified finger(s): Secondary | ICD-10-CM | POA: Diagnosis not present

## 2019-04-06 DIAGNOSIS — R29898 Other symptoms and signs involving the musculoskeletal system: Secondary | ICD-10-CM | POA: Diagnosis not present

## 2019-04-08 ENCOUNTER — Ambulatory Visit
Admission: RE | Admit: 2019-04-08 | Discharge: 2019-04-08 | Disposition: A | Discharge status: Home / Self Care | Payer: Medicare Other | Source: Ambulatory Visit | Attending: Internal Medicine | Admitting: Internal Medicine

## 2019-04-08 DIAGNOSIS — N390 Urinary tract infection, site not specified: Secondary | ICD-10-CM

## 2019-04-08 DIAGNOSIS — R319 Hematuria, unspecified: Secondary | ICD-10-CM

## 2019-04-08 DIAGNOSIS — Z1212 Encounter for screening for malignant neoplasm of rectum: Secondary | ICD-10-CM | POA: Diagnosis not present

## 2019-04-13 DIAGNOSIS — R609 Edema, unspecified: Secondary | ICD-10-CM | POA: Diagnosis not present

## 2019-04-13 DIAGNOSIS — R531 Weakness: Secondary | ICD-10-CM | POA: Diagnosis not present

## 2019-04-13 DIAGNOSIS — Z79899 Other long term (current) drug therapy: Secondary | ICD-10-CM | POA: Diagnosis not present

## 2019-04-13 DIAGNOSIS — M349 Systemic sclerosis, unspecified: Secondary | ICD-10-CM | POA: Diagnosis not present

## 2019-04-14 DIAGNOSIS — R29898 Other symptoms and signs involving the musculoskeletal system: Secondary | ICD-10-CM | POA: Diagnosis not present

## 2019-04-14 DIAGNOSIS — M79643 Pain in unspecified hand: Secondary | ICD-10-CM | POA: Diagnosis not present

## 2019-04-14 DIAGNOSIS — M349 Systemic sclerosis, unspecified: Secondary | ICD-10-CM | POA: Diagnosis not present

## 2019-04-14 DIAGNOSIS — M25642 Stiffness of left hand, not elsewhere classified: Secondary | ICD-10-CM | POA: Diagnosis not present

## 2019-04-14 DIAGNOSIS — M25641 Stiffness of right hand, not elsewhere classified: Secondary | ICD-10-CM | POA: Diagnosis not present

## 2019-04-14 DIAGNOSIS — M79646 Pain in unspecified finger(s): Secondary | ICD-10-CM | POA: Diagnosis not present

## 2019-04-19 DIAGNOSIS — R3121 Asymptomatic microscopic hematuria: Secondary | ICD-10-CM | POA: Diagnosis not present

## 2019-04-19 DIAGNOSIS — R7989 Other specified abnormal findings of blood chemistry: Secondary | ICD-10-CM | POA: Diagnosis not present

## 2019-04-19 DIAGNOSIS — R102 Pelvic and perineal pain: Secondary | ICD-10-CM | POA: Diagnosis not present

## 2019-04-19 DIAGNOSIS — R3982 Chronic bladder pain: Secondary | ICD-10-CM | POA: Diagnosis not present

## 2019-04-19 DIAGNOSIS — D649 Anemia, unspecified: Secondary | ICD-10-CM | POA: Diagnosis not present

## 2019-04-19 DIAGNOSIS — N3021 Other chronic cystitis with hematuria: Secondary | ICD-10-CM | POA: Diagnosis not present

## 2019-04-21 DIAGNOSIS — Z79899 Other long term (current) drug therapy: Secondary | ICD-10-CM | POA: Diagnosis not present

## 2019-04-21 DIAGNOSIS — D8989 Other specified disorders involving the immune mechanism, not elsewhere classified: Secondary | ICD-10-CM | POA: Diagnosis not present

## 2019-04-23 DIAGNOSIS — M79643 Pain in unspecified hand: Secondary | ICD-10-CM | POA: Diagnosis not present

## 2019-04-23 DIAGNOSIS — M25642 Stiffness of left hand, not elsewhere classified: Secondary | ICD-10-CM | POA: Diagnosis not present

## 2019-04-23 DIAGNOSIS — M349 Systemic sclerosis, unspecified: Secondary | ICD-10-CM | POA: Diagnosis not present

## 2019-04-23 DIAGNOSIS — M79646 Pain in unspecified finger(s): Secondary | ICD-10-CM | POA: Diagnosis not present

## 2019-04-23 DIAGNOSIS — R29898 Other symptoms and signs involving the musculoskeletal system: Secondary | ICD-10-CM | POA: Diagnosis not present

## 2019-04-23 DIAGNOSIS — M25641 Stiffness of right hand, not elsewhere classified: Secondary | ICD-10-CM | POA: Diagnosis not present

## 2019-04-27 DIAGNOSIS — M25641 Stiffness of right hand, not elsewhere classified: Secondary | ICD-10-CM | POA: Diagnosis not present

## 2019-04-27 DIAGNOSIS — M79646 Pain in unspecified finger(s): Secondary | ICD-10-CM | POA: Diagnosis not present

## 2019-04-27 DIAGNOSIS — M79643 Pain in unspecified hand: Secondary | ICD-10-CM | POA: Diagnosis not present

## 2019-04-27 DIAGNOSIS — R29898 Other symptoms and signs involving the musculoskeletal system: Secondary | ICD-10-CM | POA: Diagnosis not present

## 2019-04-27 DIAGNOSIS — M25642 Stiffness of left hand, not elsewhere classified: Secondary | ICD-10-CM | POA: Diagnosis not present

## 2019-04-27 DIAGNOSIS — M349 Systemic sclerosis, unspecified: Secondary | ICD-10-CM | POA: Diagnosis not present

## 2019-05-01 ENCOUNTER — Other Ambulatory Visit: Payer: Self-pay | Admitting: Oncology

## 2019-05-11 DIAGNOSIS — M25642 Stiffness of left hand, not elsewhere classified: Secondary | ICD-10-CM | POA: Diagnosis not present

## 2019-05-11 DIAGNOSIS — M79643 Pain in unspecified hand: Secondary | ICD-10-CM | POA: Diagnosis not present

## 2019-05-11 DIAGNOSIS — R29898 Other symptoms and signs involving the musculoskeletal system: Secondary | ICD-10-CM | POA: Diagnosis not present

## 2019-05-11 DIAGNOSIS — M79646 Pain in unspecified finger(s): Secondary | ICD-10-CM | POA: Diagnosis not present

## 2019-05-11 DIAGNOSIS — M25641 Stiffness of right hand, not elsewhere classified: Secondary | ICD-10-CM | POA: Diagnosis not present

## 2019-05-11 DIAGNOSIS — M349 Systemic sclerosis, unspecified: Secondary | ICD-10-CM | POA: Diagnosis not present

## 2019-05-12 DIAGNOSIS — M6289 Other specified disorders of muscle: Secondary | ICD-10-CM | POA: Diagnosis not present

## 2019-05-12 DIAGNOSIS — M6281 Muscle weakness (generalized): Secondary | ICD-10-CM | POA: Diagnosis not present

## 2019-05-12 DIAGNOSIS — M62838 Other muscle spasm: Secondary | ICD-10-CM | POA: Diagnosis not present

## 2019-05-19 DIAGNOSIS — M349 Systemic sclerosis, unspecified: Secondary | ICD-10-CM | POA: Diagnosis not present

## 2019-05-19 DIAGNOSIS — M25641 Stiffness of right hand, not elsewhere classified: Secondary | ICD-10-CM | POA: Diagnosis not present

## 2019-05-19 DIAGNOSIS — R29898 Other symptoms and signs involving the musculoskeletal system: Secondary | ICD-10-CM | POA: Diagnosis not present

## 2019-05-19 DIAGNOSIS — M79643 Pain in unspecified hand: Secondary | ICD-10-CM | POA: Diagnosis not present

## 2019-05-19 DIAGNOSIS — M25642 Stiffness of left hand, not elsewhere classified: Secondary | ICD-10-CM | POA: Diagnosis not present

## 2019-05-19 DIAGNOSIS — M79646 Pain in unspecified finger(s): Secondary | ICD-10-CM | POA: Diagnosis not present

## 2019-05-20 DIAGNOSIS — R102 Pelvic and perineal pain: Secondary | ICD-10-CM | POA: Diagnosis not present

## 2019-05-20 DIAGNOSIS — M6289 Other specified disorders of muscle: Secondary | ICD-10-CM | POA: Diagnosis not present

## 2019-05-20 DIAGNOSIS — M6281 Muscle weakness (generalized): Secondary | ICD-10-CM | POA: Diagnosis not present

## 2019-05-20 DIAGNOSIS — R3982 Chronic bladder pain: Secondary | ICD-10-CM | POA: Diagnosis not present

## 2019-05-20 DIAGNOSIS — M62838 Other muscle spasm: Secondary | ICD-10-CM | POA: Diagnosis not present

## 2019-05-26 DIAGNOSIS — R29898 Other symptoms and signs involving the musculoskeletal system: Secondary | ICD-10-CM | POA: Diagnosis not present

## 2019-05-26 DIAGNOSIS — M79643 Pain in unspecified hand: Secondary | ICD-10-CM | POA: Diagnosis not present

## 2019-05-26 DIAGNOSIS — M79646 Pain in unspecified finger(s): Secondary | ICD-10-CM | POA: Diagnosis not present

## 2019-05-26 DIAGNOSIS — M25641 Stiffness of right hand, not elsewhere classified: Secondary | ICD-10-CM | POA: Diagnosis not present

## 2019-05-26 DIAGNOSIS — M349 Systemic sclerosis, unspecified: Secondary | ICD-10-CM | POA: Diagnosis not present

## 2019-05-26 DIAGNOSIS — M25642 Stiffness of left hand, not elsewhere classified: Secondary | ICD-10-CM | POA: Diagnosis not present

## 2019-05-27 DIAGNOSIS — M6281 Muscle weakness (generalized): Secondary | ICD-10-CM | POA: Diagnosis not present

## 2019-05-27 DIAGNOSIS — M62838 Other muscle spasm: Secondary | ICD-10-CM | POA: Diagnosis not present

## 2019-05-27 DIAGNOSIS — R102 Pelvic and perineal pain: Secondary | ICD-10-CM | POA: Diagnosis not present

## 2019-05-27 DIAGNOSIS — M6289 Other specified disorders of muscle: Secondary | ICD-10-CM | POA: Diagnosis not present

## 2019-06-02 DIAGNOSIS — M25642 Stiffness of left hand, not elsewhere classified: Secondary | ICD-10-CM | POA: Diagnosis not present

## 2019-06-02 DIAGNOSIS — M349 Systemic sclerosis, unspecified: Secondary | ICD-10-CM | POA: Diagnosis not present

## 2019-06-02 DIAGNOSIS — M79643 Pain in unspecified hand: Secondary | ICD-10-CM | POA: Diagnosis not present

## 2019-06-02 DIAGNOSIS — M79646 Pain in unspecified finger(s): Secondary | ICD-10-CM | POA: Diagnosis not present

## 2019-06-02 DIAGNOSIS — M25641 Stiffness of right hand, not elsewhere classified: Secondary | ICD-10-CM | POA: Diagnosis not present

## 2019-06-02 DIAGNOSIS — R29898 Other symptoms and signs involving the musculoskeletal system: Secondary | ICD-10-CM | POA: Diagnosis not present

## 2019-06-03 DIAGNOSIS — R3982 Chronic bladder pain: Secondary | ICD-10-CM | POA: Diagnosis not present

## 2019-06-03 DIAGNOSIS — M6281 Muscle weakness (generalized): Secondary | ICD-10-CM | POA: Diagnosis not present

## 2019-06-03 DIAGNOSIS — M62838 Other muscle spasm: Secondary | ICD-10-CM | POA: Diagnosis not present

## 2019-06-03 DIAGNOSIS — R102 Pelvic and perineal pain: Secondary | ICD-10-CM | POA: Diagnosis not present

## 2019-06-03 DIAGNOSIS — M6289 Other specified disorders of muscle: Secondary | ICD-10-CM | POA: Diagnosis not present

## 2019-06-16 DIAGNOSIS — M79646 Pain in unspecified finger(s): Secondary | ICD-10-CM | POA: Diagnosis not present

## 2019-06-16 DIAGNOSIS — R29898 Other symptoms and signs involving the musculoskeletal system: Secondary | ICD-10-CM | POA: Diagnosis not present

## 2019-06-16 DIAGNOSIS — M25641 Stiffness of right hand, not elsewhere classified: Secondary | ICD-10-CM | POA: Diagnosis not present

## 2019-06-16 DIAGNOSIS — M349 Systemic sclerosis, unspecified: Secondary | ICD-10-CM | POA: Diagnosis not present

## 2019-06-16 DIAGNOSIS — M25642 Stiffness of left hand, not elsewhere classified: Secondary | ICD-10-CM | POA: Diagnosis not present

## 2019-06-16 DIAGNOSIS — M79643 Pain in unspecified hand: Secondary | ICD-10-CM | POA: Diagnosis not present

## 2019-06-23 DIAGNOSIS — M79646 Pain in unspecified finger(s): Secondary | ICD-10-CM | POA: Diagnosis not present

## 2019-06-23 DIAGNOSIS — R29898 Other symptoms and signs involving the musculoskeletal system: Secondary | ICD-10-CM | POA: Diagnosis not present

## 2019-06-23 DIAGNOSIS — M349 Systemic sclerosis, unspecified: Secondary | ICD-10-CM | POA: Diagnosis not present

## 2019-06-23 DIAGNOSIS — M79643 Pain in unspecified hand: Secondary | ICD-10-CM | POA: Diagnosis not present

## 2019-06-23 DIAGNOSIS — M25642 Stiffness of left hand, not elsewhere classified: Secondary | ICD-10-CM | POA: Diagnosis not present

## 2019-06-23 DIAGNOSIS — M25641 Stiffness of right hand, not elsewhere classified: Secondary | ICD-10-CM | POA: Diagnosis not present

## 2019-06-24 DIAGNOSIS — M6289 Other specified disorders of muscle: Secondary | ICD-10-CM | POA: Diagnosis not present

## 2019-06-24 DIAGNOSIS — R3982 Chronic bladder pain: Secondary | ICD-10-CM | POA: Diagnosis not present

## 2019-06-24 DIAGNOSIS — M62838 Other muscle spasm: Secondary | ICD-10-CM | POA: Diagnosis not present

## 2019-06-24 DIAGNOSIS — M6281 Muscle weakness (generalized): Secondary | ICD-10-CM | POA: Diagnosis not present

## 2019-06-24 DIAGNOSIS — R102 Pelvic and perineal pain: Secondary | ICD-10-CM | POA: Diagnosis not present

## 2019-07-21 DIAGNOSIS — R29898 Other symptoms and signs involving the musculoskeletal system: Secondary | ICD-10-CM | POA: Diagnosis not present

## 2019-07-21 DIAGNOSIS — M79646 Pain in unspecified finger(s): Secondary | ICD-10-CM | POA: Diagnosis not present

## 2019-07-21 DIAGNOSIS — M79643 Pain in unspecified hand: Secondary | ICD-10-CM | POA: Diagnosis not present

## 2019-07-21 DIAGNOSIS — M25642 Stiffness of left hand, not elsewhere classified: Secondary | ICD-10-CM | POA: Diagnosis not present

## 2019-07-21 DIAGNOSIS — M25641 Stiffness of right hand, not elsewhere classified: Secondary | ICD-10-CM | POA: Diagnosis not present

## 2019-07-21 DIAGNOSIS — M349 Systemic sclerosis, unspecified: Secondary | ICD-10-CM | POA: Diagnosis not present

## 2019-07-28 DIAGNOSIS — M79646 Pain in unspecified finger(s): Secondary | ICD-10-CM | POA: Diagnosis not present

## 2019-07-28 DIAGNOSIS — M25641 Stiffness of right hand, not elsewhere classified: Secondary | ICD-10-CM | POA: Diagnosis not present

## 2019-07-28 DIAGNOSIS — M25642 Stiffness of left hand, not elsewhere classified: Secondary | ICD-10-CM | POA: Diagnosis not present

## 2019-07-28 DIAGNOSIS — M349 Systemic sclerosis, unspecified: Secondary | ICD-10-CM | POA: Diagnosis not present

## 2019-07-28 DIAGNOSIS — M79643 Pain in unspecified hand: Secondary | ICD-10-CM | POA: Diagnosis not present

## 2019-07-28 DIAGNOSIS — R29898 Other symptoms and signs involving the musculoskeletal system: Secondary | ICD-10-CM | POA: Diagnosis not present

## 2019-08-10 DIAGNOSIS — H04123 Dry eye syndrome of bilateral lacrimal glands: Secondary | ICD-10-CM | POA: Diagnosis not present

## 2019-08-11 DIAGNOSIS — M349 Systemic sclerosis, unspecified: Secondary | ICD-10-CM | POA: Diagnosis not present

## 2019-08-11 DIAGNOSIS — M79646 Pain in unspecified finger(s): Secondary | ICD-10-CM | POA: Diagnosis not present

## 2019-08-11 DIAGNOSIS — R29898 Other symptoms and signs involving the musculoskeletal system: Secondary | ICD-10-CM | POA: Diagnosis not present

## 2019-08-11 DIAGNOSIS — M79643 Pain in unspecified hand: Secondary | ICD-10-CM | POA: Diagnosis not present

## 2019-08-11 DIAGNOSIS — M25641 Stiffness of right hand, not elsewhere classified: Secondary | ICD-10-CM | POA: Diagnosis not present

## 2019-08-11 DIAGNOSIS — M25642 Stiffness of left hand, not elsewhere classified: Secondary | ICD-10-CM | POA: Diagnosis not present

## 2019-08-16 DIAGNOSIS — M6289 Other specified disorders of muscle: Secondary | ICD-10-CM | POA: Diagnosis not present

## 2019-08-16 DIAGNOSIS — R102 Pelvic and perineal pain: Secondary | ICD-10-CM | POA: Diagnosis not present

## 2019-08-16 DIAGNOSIS — M62838 Other muscle spasm: Secondary | ICD-10-CM | POA: Diagnosis not present

## 2019-08-16 DIAGNOSIS — M6281 Muscle weakness (generalized): Secondary | ICD-10-CM | POA: Diagnosis not present

## 2019-08-18 DIAGNOSIS — M79643 Pain in unspecified hand: Secondary | ICD-10-CM | POA: Diagnosis not present

## 2019-08-18 DIAGNOSIS — R29898 Other symptoms and signs involving the musculoskeletal system: Secondary | ICD-10-CM | POA: Diagnosis not present

## 2019-08-18 DIAGNOSIS — M79646 Pain in unspecified finger(s): Secondary | ICD-10-CM | POA: Diagnosis not present

## 2019-08-18 DIAGNOSIS — M349 Systemic sclerosis, unspecified: Secondary | ICD-10-CM | POA: Diagnosis not present

## 2019-08-18 DIAGNOSIS — M25642 Stiffness of left hand, not elsewhere classified: Secondary | ICD-10-CM | POA: Diagnosis not present

## 2019-08-18 DIAGNOSIS — M25641 Stiffness of right hand, not elsewhere classified: Secondary | ICD-10-CM | POA: Diagnosis not present

## 2019-08-23 DIAGNOSIS — M6281 Muscle weakness (generalized): Secondary | ICD-10-CM | POA: Diagnosis not present

## 2019-08-23 DIAGNOSIS — M6289 Other specified disorders of muscle: Secondary | ICD-10-CM | POA: Diagnosis not present

## 2019-08-23 DIAGNOSIS — R102 Pelvic and perineal pain: Secondary | ICD-10-CM | POA: Diagnosis not present

## 2019-08-23 DIAGNOSIS — M62838 Other muscle spasm: Secondary | ICD-10-CM | POA: Diagnosis not present

## 2019-08-23 DIAGNOSIS — R3982 Chronic bladder pain: Secondary | ICD-10-CM | POA: Diagnosis not present

## 2019-08-25 DIAGNOSIS — M349 Systemic sclerosis, unspecified: Secondary | ICD-10-CM | POA: Diagnosis not present

## 2019-08-25 DIAGNOSIS — M25641 Stiffness of right hand, not elsewhere classified: Secondary | ICD-10-CM | POA: Diagnosis not present

## 2019-08-25 DIAGNOSIS — M25642 Stiffness of left hand, not elsewhere classified: Secondary | ICD-10-CM | POA: Diagnosis not present

## 2019-08-25 DIAGNOSIS — R29898 Other symptoms and signs involving the musculoskeletal system: Secondary | ICD-10-CM | POA: Diagnosis not present

## 2019-08-25 DIAGNOSIS — M79643 Pain in unspecified hand: Secondary | ICD-10-CM | POA: Diagnosis not present

## 2019-08-25 DIAGNOSIS — M79646 Pain in unspecified finger(s): Secondary | ICD-10-CM | POA: Diagnosis not present

## 2019-08-31 DIAGNOSIS — Z79899 Other long term (current) drug therapy: Secondary | ICD-10-CM | POA: Diagnosis not present

## 2019-08-31 DIAGNOSIS — R0602 Shortness of breath: Secondary | ICD-10-CM | POA: Diagnosis not present

## 2019-08-31 DIAGNOSIS — N060 Isolated proteinuria with minor glomerular abnormality: Secondary | ICD-10-CM | POA: Diagnosis not present

## 2019-08-31 DIAGNOSIS — J849 Interstitial pulmonary disease, unspecified: Secondary | ICD-10-CM | POA: Diagnosis not present

## 2019-08-31 DIAGNOSIS — M349 Systemic sclerosis, unspecified: Secondary | ICD-10-CM | POA: Diagnosis not present

## 2019-08-31 DIAGNOSIS — K2100 Gastro-esophageal reflux disease with esophagitis, without bleeding: Secondary | ICD-10-CM | POA: Diagnosis not present

## 2019-09-01 DIAGNOSIS — M25642 Stiffness of left hand, not elsewhere classified: Secondary | ICD-10-CM | POA: Diagnosis not present

## 2019-09-01 DIAGNOSIS — M79646 Pain in unspecified finger(s): Secondary | ICD-10-CM | POA: Diagnosis not present

## 2019-09-01 DIAGNOSIS — M79643 Pain in unspecified hand: Secondary | ICD-10-CM | POA: Diagnosis not present

## 2019-09-01 DIAGNOSIS — M25641 Stiffness of right hand, not elsewhere classified: Secondary | ICD-10-CM | POA: Diagnosis not present

## 2019-09-01 DIAGNOSIS — R29898 Other symptoms and signs involving the musculoskeletal system: Secondary | ICD-10-CM | POA: Diagnosis not present

## 2019-09-01 DIAGNOSIS — M349 Systemic sclerosis, unspecified: Secondary | ICD-10-CM | POA: Diagnosis not present

## 2019-09-06 DIAGNOSIS — M6281 Muscle weakness (generalized): Secondary | ICD-10-CM | POA: Diagnosis not present

## 2019-09-06 DIAGNOSIS — R102 Pelvic and perineal pain: Secondary | ICD-10-CM | POA: Diagnosis not present

## 2019-09-06 DIAGNOSIS — M6289 Other specified disorders of muscle: Secondary | ICD-10-CM | POA: Diagnosis not present

## 2019-09-06 DIAGNOSIS — M62838 Other muscle spasm: Secondary | ICD-10-CM | POA: Diagnosis not present

## 2019-09-08 DIAGNOSIS — M79646 Pain in unspecified finger(s): Secondary | ICD-10-CM | POA: Diagnosis not present

## 2019-09-08 DIAGNOSIS — M25641 Stiffness of right hand, not elsewhere classified: Secondary | ICD-10-CM | POA: Diagnosis not present

## 2019-09-08 DIAGNOSIS — M25642 Stiffness of left hand, not elsewhere classified: Secondary | ICD-10-CM | POA: Diagnosis not present

## 2019-09-08 DIAGNOSIS — M79643 Pain in unspecified hand: Secondary | ICD-10-CM | POA: Diagnosis not present

## 2019-09-08 DIAGNOSIS — M349 Systemic sclerosis, unspecified: Secondary | ICD-10-CM | POA: Diagnosis not present

## 2019-09-08 DIAGNOSIS — R29898 Other symptoms and signs involving the musculoskeletal system: Secondary | ICD-10-CM | POA: Diagnosis not present

## 2019-09-13 DIAGNOSIS — M349 Systemic sclerosis, unspecified: Secondary | ICD-10-CM | POA: Diagnosis not present

## 2019-09-13 DIAGNOSIS — M62838 Other muscle spasm: Secondary | ICD-10-CM | POA: Diagnosis not present

## 2019-09-13 DIAGNOSIS — M6281 Muscle weakness (generalized): Secondary | ICD-10-CM | POA: Diagnosis not present

## 2019-09-13 DIAGNOSIS — M6289 Other specified disorders of muscle: Secondary | ICD-10-CM | POA: Diagnosis not present

## 2019-09-13 DIAGNOSIS — R102 Pelvic and perineal pain: Secondary | ICD-10-CM | POA: Diagnosis not present

## 2019-09-15 DIAGNOSIS — R29898 Other symptoms and signs involving the musculoskeletal system: Secondary | ICD-10-CM | POA: Diagnosis not present

## 2019-09-15 DIAGNOSIS — M349 Systemic sclerosis, unspecified: Secondary | ICD-10-CM | POA: Diagnosis not present

## 2019-09-15 DIAGNOSIS — M79646 Pain in unspecified finger(s): Secondary | ICD-10-CM | POA: Diagnosis not present

## 2019-09-15 DIAGNOSIS — M25641 Stiffness of right hand, not elsewhere classified: Secondary | ICD-10-CM | POA: Diagnosis not present

## 2019-09-15 DIAGNOSIS — M25642 Stiffness of left hand, not elsewhere classified: Secondary | ICD-10-CM | POA: Diagnosis not present

## 2019-09-15 DIAGNOSIS — M79643 Pain in unspecified hand: Secondary | ICD-10-CM | POA: Diagnosis not present

## 2019-09-20 DIAGNOSIS — M62838 Other muscle spasm: Secondary | ICD-10-CM | POA: Diagnosis not present

## 2019-09-20 DIAGNOSIS — M6281 Muscle weakness (generalized): Secondary | ICD-10-CM | POA: Diagnosis not present

## 2019-09-20 DIAGNOSIS — R3982 Chronic bladder pain: Secondary | ICD-10-CM | POA: Diagnosis not present

## 2019-09-20 DIAGNOSIS — R102 Pelvic and perineal pain: Secondary | ICD-10-CM | POA: Diagnosis not present

## 2019-09-20 DIAGNOSIS — M6289 Other specified disorders of muscle: Secondary | ICD-10-CM | POA: Diagnosis not present

## 2019-09-22 DIAGNOSIS — R208 Other disturbances of skin sensation: Secondary | ICD-10-CM | POA: Diagnosis not present

## 2019-09-22 DIAGNOSIS — M79646 Pain in unspecified finger(s): Secondary | ICD-10-CM | POA: Diagnosis not present

## 2019-09-22 DIAGNOSIS — M25642 Stiffness of left hand, not elsewhere classified: Secondary | ICD-10-CM | POA: Diagnosis not present

## 2019-09-22 DIAGNOSIS — M349 Systemic sclerosis, unspecified: Secondary | ICD-10-CM | POA: Diagnosis not present

## 2019-09-22 DIAGNOSIS — M25641 Stiffness of right hand, not elsewhere classified: Secondary | ICD-10-CM | POA: Diagnosis not present

## 2019-09-22 DIAGNOSIS — R29898 Other symptoms and signs involving the musculoskeletal system: Secondary | ICD-10-CM | POA: Diagnosis not present

## 2019-09-22 DIAGNOSIS — M79643 Pain in unspecified hand: Secondary | ICD-10-CM | POA: Diagnosis not present

## 2019-09-29 DIAGNOSIS — M25641 Stiffness of right hand, not elsewhere classified: Secondary | ICD-10-CM | POA: Diagnosis not present

## 2019-09-29 DIAGNOSIS — M349 Systemic sclerosis, unspecified: Secondary | ICD-10-CM | POA: Diagnosis not present

## 2019-09-29 DIAGNOSIS — M79643 Pain in unspecified hand: Secondary | ICD-10-CM | POA: Diagnosis not present

## 2019-09-29 DIAGNOSIS — M79646 Pain in unspecified finger(s): Secondary | ICD-10-CM | POA: Diagnosis not present

## 2019-09-29 DIAGNOSIS — H6982 Other specified disorders of Eustachian tube, left ear: Secondary | ICD-10-CM | POA: Diagnosis not present

## 2019-09-29 DIAGNOSIS — R208 Other disturbances of skin sensation: Secondary | ICD-10-CM | POA: Diagnosis not present

## 2019-09-29 DIAGNOSIS — R29898 Other symptoms and signs involving the musculoskeletal system: Secondary | ICD-10-CM | POA: Diagnosis not present

## 2019-09-29 DIAGNOSIS — M25642 Stiffness of left hand, not elsewhere classified: Secondary | ICD-10-CM | POA: Diagnosis not present

## 2019-10-05 DIAGNOSIS — L820 Inflamed seborrheic keratosis: Secondary | ICD-10-CM | POA: Diagnosis not present

## 2019-10-05 DIAGNOSIS — L812 Freckles: Secondary | ICD-10-CM | POA: Diagnosis not present

## 2019-10-05 DIAGNOSIS — Z85828 Personal history of other malignant neoplasm of skin: Secondary | ICD-10-CM | POA: Diagnosis not present

## 2019-10-05 DIAGNOSIS — L821 Other seborrheic keratosis: Secondary | ICD-10-CM | POA: Diagnosis not present

## 2019-10-06 DIAGNOSIS — M79643 Pain in unspecified hand: Secondary | ICD-10-CM | POA: Diagnosis not present

## 2019-10-06 DIAGNOSIS — M349 Systemic sclerosis, unspecified: Secondary | ICD-10-CM | POA: Diagnosis not present

## 2019-10-06 DIAGNOSIS — M25642 Stiffness of left hand, not elsewhere classified: Secondary | ICD-10-CM | POA: Diagnosis not present

## 2019-10-06 DIAGNOSIS — M25641 Stiffness of right hand, not elsewhere classified: Secondary | ICD-10-CM | POA: Diagnosis not present

## 2019-10-06 DIAGNOSIS — R208 Other disturbances of skin sensation: Secondary | ICD-10-CM | POA: Diagnosis not present

## 2019-10-06 DIAGNOSIS — R29898 Other symptoms and signs involving the musculoskeletal system: Secondary | ICD-10-CM | POA: Diagnosis not present

## 2019-10-06 DIAGNOSIS — H6982 Other specified disorders of Eustachian tube, left ear: Secondary | ICD-10-CM | POA: Diagnosis not present

## 2019-10-06 DIAGNOSIS — M79646 Pain in unspecified finger(s): Secondary | ICD-10-CM | POA: Diagnosis not present

## 2019-10-07 DIAGNOSIS — R3982 Chronic bladder pain: Secondary | ICD-10-CM | POA: Diagnosis not present

## 2019-10-07 DIAGNOSIS — M6281 Muscle weakness (generalized): Secondary | ICD-10-CM | POA: Diagnosis not present

## 2019-10-07 DIAGNOSIS — M62838 Other muscle spasm: Secondary | ICD-10-CM | POA: Diagnosis not present

## 2019-10-07 DIAGNOSIS — M6289 Other specified disorders of muscle: Secondary | ICD-10-CM | POA: Diagnosis not present

## 2019-10-07 DIAGNOSIS — R102 Pelvic and perineal pain: Secondary | ICD-10-CM | POA: Diagnosis not present

## 2019-10-11 DIAGNOSIS — M62838 Other muscle spasm: Secondary | ICD-10-CM | POA: Diagnosis not present

## 2019-10-11 DIAGNOSIS — M6281 Muscle weakness (generalized): Secondary | ICD-10-CM | POA: Diagnosis not present

## 2019-10-11 DIAGNOSIS — M349 Systemic sclerosis, unspecified: Secondary | ICD-10-CM | POA: Diagnosis not present

## 2019-10-11 DIAGNOSIS — M6289 Other specified disorders of muscle: Secondary | ICD-10-CM | POA: Diagnosis not present

## 2019-10-11 DIAGNOSIS — R102 Pelvic and perineal pain: Secondary | ICD-10-CM | POA: Diagnosis not present

## 2019-10-22 DIAGNOSIS — R208 Other disturbances of skin sensation: Secondary | ICD-10-CM | POA: Diagnosis not present

## 2019-10-22 DIAGNOSIS — M25641 Stiffness of right hand, not elsewhere classified: Secondary | ICD-10-CM | POA: Diagnosis not present

## 2019-10-22 DIAGNOSIS — M349 Systemic sclerosis, unspecified: Secondary | ICD-10-CM | POA: Diagnosis not present

## 2019-10-22 DIAGNOSIS — M79646 Pain in unspecified finger(s): Secondary | ICD-10-CM | POA: Diagnosis not present

## 2019-10-22 DIAGNOSIS — M25642 Stiffness of left hand, not elsewhere classified: Secondary | ICD-10-CM | POA: Diagnosis not present

## 2019-10-22 DIAGNOSIS — M79643 Pain in unspecified hand: Secondary | ICD-10-CM | POA: Diagnosis not present

## 2019-10-22 DIAGNOSIS — R29898 Other symptoms and signs involving the musculoskeletal system: Secondary | ICD-10-CM | POA: Diagnosis not present

## 2019-10-25 DIAGNOSIS — M6281 Muscle weakness (generalized): Secondary | ICD-10-CM | POA: Diagnosis not present

## 2019-10-25 DIAGNOSIS — R102 Pelvic and perineal pain: Secondary | ICD-10-CM | POA: Diagnosis not present

## 2019-10-25 DIAGNOSIS — M6289 Other specified disorders of muscle: Secondary | ICD-10-CM | POA: Diagnosis not present

## 2019-10-25 DIAGNOSIS — M62838 Other muscle spasm: Secondary | ICD-10-CM | POA: Diagnosis not present

## 2019-10-25 DIAGNOSIS — R3982 Chronic bladder pain: Secondary | ICD-10-CM | POA: Diagnosis not present

## 2019-10-27 DIAGNOSIS — M25641 Stiffness of right hand, not elsewhere classified: Secondary | ICD-10-CM | POA: Diagnosis not present

## 2019-10-27 DIAGNOSIS — M25642 Stiffness of left hand, not elsewhere classified: Secondary | ICD-10-CM | POA: Diagnosis not present

## 2019-11-08 DIAGNOSIS — M62838 Other muscle spasm: Secondary | ICD-10-CM | POA: Diagnosis not present

## 2019-11-08 DIAGNOSIS — R102 Pelvic and perineal pain: Secondary | ICD-10-CM | POA: Diagnosis not present

## 2019-11-08 DIAGNOSIS — N3021 Other chronic cystitis with hematuria: Secondary | ICD-10-CM | POA: Diagnosis not present

## 2019-11-08 DIAGNOSIS — M6281 Muscle weakness (generalized): Secondary | ICD-10-CM | POA: Diagnosis not present

## 2019-11-08 DIAGNOSIS — R3982 Chronic bladder pain: Secondary | ICD-10-CM | POA: Diagnosis not present

## 2019-11-10 DIAGNOSIS — H6982 Other specified disorders of Eustachian tube, left ear: Secondary | ICD-10-CM | POA: Diagnosis not present

## 2019-11-10 DIAGNOSIS — R29898 Other symptoms and signs involving the musculoskeletal system: Secondary | ICD-10-CM | POA: Diagnosis not present

## 2019-11-10 DIAGNOSIS — M79646 Pain in unspecified finger(s): Secondary | ICD-10-CM | POA: Diagnosis not present

## 2019-11-10 DIAGNOSIS — R208 Other disturbances of skin sensation: Secondary | ICD-10-CM | POA: Diagnosis not present

## 2019-11-10 DIAGNOSIS — M79643 Pain in unspecified hand: Secondary | ICD-10-CM | POA: Diagnosis not present

## 2019-11-10 DIAGNOSIS — M349 Systemic sclerosis, unspecified: Secondary | ICD-10-CM | POA: Diagnosis not present

## 2019-11-10 DIAGNOSIS — M25641 Stiffness of right hand, not elsewhere classified: Secondary | ICD-10-CM | POA: Diagnosis not present

## 2019-11-10 DIAGNOSIS — Z853 Personal history of malignant neoplasm of breast: Secondary | ICD-10-CM | POA: Diagnosis not present

## 2019-11-10 DIAGNOSIS — M25642 Stiffness of left hand, not elsewhere classified: Secondary | ICD-10-CM | POA: Diagnosis not present

## 2019-11-16 DIAGNOSIS — I7300 Raynaud's syndrome without gangrene: Secondary | ICD-10-CM | POA: Diagnosis not present

## 2019-11-16 DIAGNOSIS — Z79899 Other long term (current) drug therapy: Secondary | ICD-10-CM | POA: Diagnosis not present

## 2019-11-16 DIAGNOSIS — M349 Systemic sclerosis, unspecified: Secondary | ICD-10-CM | POA: Diagnosis not present

## 2019-11-18 DIAGNOSIS — R29898 Other symptoms and signs involving the musculoskeletal system: Secondary | ICD-10-CM | POA: Diagnosis not present

## 2019-11-18 DIAGNOSIS — R208 Other disturbances of skin sensation: Secondary | ICD-10-CM | POA: Diagnosis not present

## 2019-11-18 DIAGNOSIS — M25642 Stiffness of left hand, not elsewhere classified: Secondary | ICD-10-CM | POA: Diagnosis not present

## 2019-11-18 DIAGNOSIS — M25641 Stiffness of right hand, not elsewhere classified: Secondary | ICD-10-CM | POA: Diagnosis not present

## 2019-11-18 DIAGNOSIS — M349 Systemic sclerosis, unspecified: Secondary | ICD-10-CM | POA: Diagnosis not present

## 2019-11-18 DIAGNOSIS — H6982 Other specified disorders of Eustachian tube, left ear: Secondary | ICD-10-CM | POA: Diagnosis not present

## 2019-11-19 DIAGNOSIS — M62838 Other muscle spasm: Secondary | ICD-10-CM | POA: Diagnosis not present

## 2019-11-19 DIAGNOSIS — M6281 Muscle weakness (generalized): Secondary | ICD-10-CM | POA: Diagnosis not present

## 2019-11-19 DIAGNOSIS — R3982 Chronic bladder pain: Secondary | ICD-10-CM | POA: Diagnosis not present

## 2019-11-19 DIAGNOSIS — R102 Pelvic and perineal pain: Secondary | ICD-10-CM | POA: Diagnosis not present

## 2019-11-19 DIAGNOSIS — M6289 Other specified disorders of muscle: Secondary | ICD-10-CM | POA: Diagnosis not present

## 2019-11-24 DIAGNOSIS — M349 Systemic sclerosis, unspecified: Secondary | ICD-10-CM | POA: Diagnosis not present

## 2019-11-24 DIAGNOSIS — M79643 Pain in unspecified hand: Secondary | ICD-10-CM | POA: Diagnosis not present

## 2019-11-24 DIAGNOSIS — M25642 Stiffness of left hand, not elsewhere classified: Secondary | ICD-10-CM | POA: Diagnosis not present

## 2019-11-24 DIAGNOSIS — R208 Other disturbances of skin sensation: Secondary | ICD-10-CM | POA: Diagnosis not present

## 2019-11-24 DIAGNOSIS — M79646 Pain in unspecified finger(s): Secondary | ICD-10-CM | POA: Diagnosis not present

## 2019-11-24 DIAGNOSIS — M25641 Stiffness of right hand, not elsewhere classified: Secondary | ICD-10-CM | POA: Diagnosis not present

## 2019-11-24 DIAGNOSIS — R29898 Other symptoms and signs involving the musculoskeletal system: Secondary | ICD-10-CM | POA: Diagnosis not present

## 2019-11-26 DIAGNOSIS — M62838 Other muscle spasm: Secondary | ICD-10-CM | POA: Diagnosis not present

## 2019-11-26 DIAGNOSIS — M6289 Other specified disorders of muscle: Secondary | ICD-10-CM | POA: Diagnosis not present

## 2019-11-26 DIAGNOSIS — R102 Pelvic and perineal pain: Secondary | ICD-10-CM | POA: Diagnosis not present

## 2019-11-26 DIAGNOSIS — R3982 Chronic bladder pain: Secondary | ICD-10-CM | POA: Diagnosis not present

## 2019-11-26 DIAGNOSIS — M6281 Muscle weakness (generalized): Secondary | ICD-10-CM | POA: Diagnosis not present

## 2019-11-27 ENCOUNTER — Other Ambulatory Visit: Payer: Self-pay | Admitting: Internal Medicine

## 2019-11-29 DIAGNOSIS — M6289 Other specified disorders of muscle: Secondary | ICD-10-CM | POA: Diagnosis not present

## 2019-11-29 DIAGNOSIS — M62838 Other muscle spasm: Secondary | ICD-10-CM | POA: Diagnosis not present

## 2019-11-29 DIAGNOSIS — R3982 Chronic bladder pain: Secondary | ICD-10-CM | POA: Diagnosis not present

## 2019-11-29 DIAGNOSIS — M6281 Muscle weakness (generalized): Secondary | ICD-10-CM | POA: Diagnosis not present

## 2019-11-29 DIAGNOSIS — R102 Pelvic and perineal pain: Secondary | ICD-10-CM | POA: Diagnosis not present

## 2019-11-29 DIAGNOSIS — M349 Systemic sclerosis, unspecified: Secondary | ICD-10-CM | POA: Diagnosis not present

## 2019-12-01 DIAGNOSIS — M25642 Stiffness of left hand, not elsewhere classified: Secondary | ICD-10-CM | POA: Diagnosis not present

## 2019-12-01 DIAGNOSIS — M349 Systemic sclerosis, unspecified: Secondary | ICD-10-CM | POA: Diagnosis not present

## 2019-12-01 DIAGNOSIS — M79643 Pain in unspecified hand: Secondary | ICD-10-CM | POA: Diagnosis not present

## 2019-12-01 DIAGNOSIS — M25641 Stiffness of right hand, not elsewhere classified: Secondary | ICD-10-CM | POA: Diagnosis not present

## 2019-12-01 DIAGNOSIS — M79646 Pain in unspecified finger(s): Secondary | ICD-10-CM | POA: Diagnosis not present

## 2019-12-01 DIAGNOSIS — R29898 Other symptoms and signs involving the musculoskeletal system: Secondary | ICD-10-CM | POA: Diagnosis not present

## 2019-12-01 DIAGNOSIS — R208 Other disturbances of skin sensation: Secondary | ICD-10-CM | POA: Diagnosis not present

## 2019-12-06 DIAGNOSIS — M6281 Muscle weakness (generalized): Secondary | ICD-10-CM | POA: Diagnosis not present

## 2019-12-06 DIAGNOSIS — M62838 Other muscle spasm: Secondary | ICD-10-CM | POA: Diagnosis not present

## 2019-12-06 DIAGNOSIS — N3021 Other chronic cystitis with hematuria: Secondary | ICD-10-CM | POA: Diagnosis not present

## 2019-12-06 DIAGNOSIS — R3982 Chronic bladder pain: Secondary | ICD-10-CM | POA: Diagnosis not present

## 2019-12-06 DIAGNOSIS — M6289 Other specified disorders of muscle: Secondary | ICD-10-CM | POA: Diagnosis not present

## 2019-12-06 DIAGNOSIS — R102 Pelvic and perineal pain: Secondary | ICD-10-CM | POA: Diagnosis not present

## 2019-12-08 DIAGNOSIS — M25641 Stiffness of right hand, not elsewhere classified: Secondary | ICD-10-CM | POA: Diagnosis not present

## 2019-12-08 DIAGNOSIS — R208 Other disturbances of skin sensation: Secondary | ICD-10-CM | POA: Diagnosis not present

## 2019-12-08 DIAGNOSIS — M25642 Stiffness of left hand, not elsewhere classified: Secondary | ICD-10-CM | POA: Diagnosis not present

## 2019-12-08 DIAGNOSIS — M349 Systemic sclerosis, unspecified: Secondary | ICD-10-CM | POA: Diagnosis not present

## 2019-12-08 DIAGNOSIS — R29898 Other symptoms and signs involving the musculoskeletal system: Secondary | ICD-10-CM | POA: Diagnosis not present

## 2019-12-08 DIAGNOSIS — M79646 Pain in unspecified finger(s): Secondary | ICD-10-CM | POA: Diagnosis not present

## 2019-12-08 DIAGNOSIS — M79643 Pain in unspecified hand: Secondary | ICD-10-CM | POA: Diagnosis not present

## 2019-12-14 DIAGNOSIS — M6281 Muscle weakness (generalized): Secondary | ICD-10-CM | POA: Diagnosis not present

## 2019-12-14 DIAGNOSIS — R3982 Chronic bladder pain: Secondary | ICD-10-CM | POA: Diagnosis not present

## 2019-12-14 DIAGNOSIS — R102 Pelvic and perineal pain: Secondary | ICD-10-CM | POA: Diagnosis not present

## 2019-12-14 DIAGNOSIS — M6289 Other specified disorders of muscle: Secondary | ICD-10-CM | POA: Diagnosis not present

## 2019-12-14 DIAGNOSIS — M62838 Other muscle spasm: Secondary | ICD-10-CM | POA: Diagnosis not present

## 2019-12-14 DIAGNOSIS — N3021 Other chronic cystitis with hematuria: Secondary | ICD-10-CM | POA: Diagnosis not present

## 2019-12-15 DIAGNOSIS — R29898 Other symptoms and signs involving the musculoskeletal system: Secondary | ICD-10-CM | POA: Diagnosis not present

## 2019-12-15 DIAGNOSIS — M25641 Stiffness of right hand, not elsewhere classified: Secondary | ICD-10-CM | POA: Diagnosis not present

## 2019-12-15 DIAGNOSIS — M25642 Stiffness of left hand, not elsewhere classified: Secondary | ICD-10-CM | POA: Diagnosis not present

## 2019-12-15 DIAGNOSIS — R208 Other disturbances of skin sensation: Secondary | ICD-10-CM | POA: Diagnosis not present

## 2019-12-15 DIAGNOSIS — M79646 Pain in unspecified finger(s): Secondary | ICD-10-CM | POA: Diagnosis not present

## 2019-12-15 DIAGNOSIS — M79643 Pain in unspecified hand: Secondary | ICD-10-CM | POA: Diagnosis not present

## 2019-12-15 DIAGNOSIS — M349 Systemic sclerosis, unspecified: Secondary | ICD-10-CM | POA: Diagnosis not present

## 2019-12-22 DIAGNOSIS — M25642 Stiffness of left hand, not elsewhere classified: Secondary | ICD-10-CM | POA: Diagnosis not present

## 2019-12-22 DIAGNOSIS — M25641 Stiffness of right hand, not elsewhere classified: Secondary | ICD-10-CM | POA: Diagnosis not present

## 2019-12-29 DIAGNOSIS — M79646 Pain in unspecified finger(s): Secondary | ICD-10-CM | POA: Diagnosis not present

## 2019-12-29 DIAGNOSIS — R29898 Other symptoms and signs involving the musculoskeletal system: Secondary | ICD-10-CM | POA: Diagnosis not present

## 2019-12-29 DIAGNOSIS — M25641 Stiffness of right hand, not elsewhere classified: Secondary | ICD-10-CM | POA: Diagnosis not present

## 2019-12-29 DIAGNOSIS — M79643 Pain in unspecified hand: Secondary | ICD-10-CM | POA: Diagnosis not present

## 2019-12-29 DIAGNOSIS — M25642 Stiffness of left hand, not elsewhere classified: Secondary | ICD-10-CM | POA: Diagnosis not present

## 2019-12-29 DIAGNOSIS — M349 Systemic sclerosis, unspecified: Secondary | ICD-10-CM | POA: Diagnosis not present

## 2019-12-29 DIAGNOSIS — R208 Other disturbances of skin sensation: Secondary | ICD-10-CM | POA: Diagnosis not present

## 2020-01-24 NOTE — Progress Notes (Addendum)
69 y.o. G68P1010 Married White or Caucasian female here for annual exam.  Doing well.  Being followed at [facility].  Is doing PT with occupational therapy.  Denies vaginal bleeding.    Followed by Dr. [name] and having yearly breast MRI.  MRI is scheduled for July 29th.    PCP:  Dr. Brigitte Pulse.  No LMP recorded. Patient is postmenopausal.          Sexually active: Yes.    The current method of family planning is post menopausal status.    Exercising: Yes.    walking, physical therapy  Smoker:  no  Health Maintenance: Pap:  04-04-17 neg HPV HR neg, 01-04-2019 neg History of abnormal Pap:  no MMG:  02-08-2019 bilateral MRI 02-06-2019 birads 2:neg Colonoscopy:  2019 with Pyrtle BMD:   04-18-14 osteopenia TDaP:  2020 Pneumonia vaccine(s):  no Shingrix:  zoster 2013 Hep C testing: had done Screening Labs: PCP   reports that she has never smoked. She has never used smokeless tobacco. She reports current alcohol use. She reports that she does not use drugs.  Past Medical History:  Diagnosis Date  . Anemia   . 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 Colon polyp   . 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 Depression   . Esophageal stricture   . GERD (gastroesophageal reflux disease)   . Hiatal hernia   . History of external beam radiation therapy 11-25-2011 to 01-08-2012   left breast 45 Gy at 1.8 per fraction x25 fractions, boost 16 Gy at 2 per fraction x8 fractions  . Hyperlipidemia   . Hypothyroidism   . Internal hemorrhoids   . Osteoarthritis   . Osteopenia   . Positive ANA (antinuclear antibody)   . Rash    arms and legs caused by skin scleroderma  . Raynaud's  syndrome   . 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 Ruthann Cancer at [facility]   systemic sclerosis , diffuse/   rheumotologist:  dr Manuella Ghazi at [facility]---  [place] 07-22-2017 (as of 07-25-2017 note not available to read)---  per pt has appt. w/ specialist at [facility]  . Skin thickening    hands, arms, legs  caused by scleroderma  . Systemic sclerosis [facility])     Past Surgical History:  Procedure Laterality Date  . BIOPSY  01/29/2018   Procedure: BIOPSY;  Surgeon: Jerene Bears, MD;  Location: [facility];  Service: Gastroenterology;;  . Lillard Anes  2000   Left  . COLONOSCOPY WITH PROPOFOL N/A 01/29/2018   Procedure: COLONOSCOPY WITH PROPOFOL;  Surgeon: Jerene Bears, MD;  Location: [facility];  Service: Gastroenterology;  Laterality: N/A;  . DILATATION & CURETTAGE/HYSTEROSCOPY WITH MYOSURE N/A 08/04/2017   Procedure: DILATATION & CURETTAGE/HYSTEROSCOPY WITH MYOSURE;  Surgeon: Megan Salon, MD;  Location: [place];  Service: Gynecology;  Laterality: N/A;  . ECTOPIC PREGNANCY SURGERY  1986-88   s/p unilateral salpingectomy  . ESOPHAGOGASTRODUODENOSCOPY (EGD) WITH PROPOFOL N/A 01/29/2018   Procedure: ESOPHAGOGASTRODUODENOSCOPY (EGD) WITH PROPOFOL;  Surgeon: Jerene Bears, MD;  Location: [facility];  Service: Gastroenterology;  Laterality: N/A;  . PARTIAL MASTECTOMY WITH AXILLARY SENTINEL LYMPH NODE BIOPSY Left 11-04-2011  dr Margot Chimes  [facility]  . TRANSTHORACIC ECHOCARDIOGRAM  06-04-2017    [facility]   moderate LVH, ef>55%/  trivial AR and TR/ mild MR and PR/  trivial pericardial effusion  . WISDOM TOOTH EXTRACTION      Current Outpatient Medications  Medication Sig Dispense Refill  . ELDERBERRY PO Take by mouth 2 (two) times daily.    . furosemide (LASIX) 20 MG tablet Take 20 mg by mouth daily.     [name] gabapentin (NEURONTIN) 100 MG capsule Take 100-200 mg by mouth See admin instructions. Take 121m by mouth in the morning and afternoon and 1027mat night  3  . pantoprazole  (PROTONIX) 40 MG tablet TAKE 1 TABLET BY MOUTH TWICE DAILY BEFORE A MEAL. NEEDS OFFICE VISIT FOR ADDITIONAL REFILLS 180 tablet 0  . SYNTHROID 100 MCG tablet Take 100 mcg by mouth daily before breakfast.     . tamoxifen (NOLVADEX) 20 MG tablet TAKE ONE TABLET BY MOUTH ONE TIME DAILY  90 tablet 2   No current facility-administered medications for this visit.    Family History  Problem Relation Age of Onset  . Heart disease Mother   . Heart failure Mother   . Heart disease Father   . Ovarian cancer Maternal Aunt   . Heart disease Paternal Uncle        multiple  . Autoimmune disease Sister   . Arrhythmia Sister   . Anemia Sister   . CAD Brother   . Colon cancer Neg Hx   . Stomach cancer Neg Hx   . Esophageal cancer Neg Hx   . Rectal cancer Neg Hx     Review of Systems  All other systems reviewed and are negative.   Exam:   BP (!) 148/76 (BP Location: Right Arm, Patient Position: Sitting, Cuff Size: Normal)   Pulse 86   Resp 14   Ht '5\' 2"'  (1.575 m)   Wt 149 lb (67.6 kg)   BMI 27.25 kg/m   Height: '5\' 2"'  (157.5 cm)  General appearance: alert, cooperative and appears stated age Head: Normocephalic, without obvious abnormality, atraumatic Neck: no adenopathy, supple, symmetrical, trachea midline and thyroid normal to inspection and palpation Lungs: clear to auscultation bilaterally Breasts: extreme density throughout entire left breast, no discrete mass, no nipple discharge, no LAD; right breast with well healed incision, no changes with scar, no LAD, no nipple discharge Heart: regular rate and rhythm Abdomen: soft, non-tender; bowel sounds normal; no masses,  no organomegaly Extremities: extremities normal, atraumatic, no cyanosis or edema Skin: Skin color, texture, turgor normal. No rashes or lesions Lymph nodes: Cervical, supraclavicular, and axillary nodes normal. No abnormal inguinal nodes palpated Neurologic: Grossly normal   Pelvic: External genitalia:  no lesions               Urethra:  normal appearing urethra with no masses, tenderness or lesions              Bartholins and Skenes: normal                 Vagina: normal appearing vagina with normal color and discharge, no lesions              Cervix: no lesions              Pap taken: No. Bimanual Exam:  Uterus:  normal size, contour, position, consistency, mobility, non-tender              Adnexa: normal adnexa and no mass,  fullness, tenderness               Rectovaginal: Confirms               Anus:  normal sphincter tone, no lesions  Chaperone, Royal Hawthorn, CMA, was present for exam.  A:  Well Woman with normal exam H/o scleroderma, followed at [facility] (with increased risks of lung, liver, tongue, bladder, blood cancers) OAB Raynaud's  H/o recurrent UTIs but under better control H/o breast cancer stage II1, grade 2, 20213 Colon polyp  P:   Does yearly breast MRI.  This is scheduled for July Pap neg 2020 Last colonoscopy was 2019 Order for BMD placed CBC, lipids, and CMP obtained today Return for AEX 1 year or sooner if needed.

## 2020-01-26 DIAGNOSIS — M349 Systemic sclerosis, unspecified: Secondary | ICD-10-CM | POA: Diagnosis not present

## 2020-01-26 DIAGNOSIS — M79643 Pain in unspecified hand: Secondary | ICD-10-CM | POA: Diagnosis not present

## 2020-01-26 DIAGNOSIS — M25642 Stiffness of left hand, not elsewhere classified: Secondary | ICD-10-CM | POA: Diagnosis not present

## 2020-01-26 DIAGNOSIS — R29898 Other symptoms and signs involving the musculoskeletal system: Secondary | ICD-10-CM | POA: Diagnosis not present

## 2020-01-26 DIAGNOSIS — M79646 Pain in unspecified finger(s): Secondary | ICD-10-CM | POA: Diagnosis not present

## 2020-01-26 DIAGNOSIS — M25641 Stiffness of right hand, not elsewhere classified: Secondary | ICD-10-CM | POA: Diagnosis not present

## 2020-01-26 DIAGNOSIS — R208 Other disturbances of skin sensation: Secondary | ICD-10-CM | POA: Diagnosis not present

## 2020-01-27 ENCOUNTER — Encounter: Payer: Self-pay | Admitting: Obstetrics & Gynecology

## 2020-01-27 ENCOUNTER — Other Ambulatory Visit: Payer: Self-pay

## 2020-01-27 ENCOUNTER — Ambulatory Visit (INDEPENDENT_AMBULATORY_CARE_PROVIDER_SITE_OTHER): Payer: Medicare Other | Admitting: Obstetrics & Gynecology

## 2020-01-27 VITALS — BP 148/76 | HR 86 | Resp 14 | Ht 62.0 in | Wt 149.0 lb

## 2020-01-27 DIAGNOSIS — M8580 Other specified disorders of bone density and structure, unspecified site: Secondary | ICD-10-CM | POA: Diagnosis not present

## 2020-01-27 DIAGNOSIS — Z124 Encounter for screening for malignant neoplasm of cervix: Secondary | ICD-10-CM | POA: Diagnosis not present

## 2020-01-27 DIAGNOSIS — M349 Systemic sclerosis, unspecified: Secondary | ICD-10-CM | POA: Diagnosis not present

## 2020-01-27 DIAGNOSIS — E7800 Pure hypercholesterolemia, unspecified: Secondary | ICD-10-CM

## 2020-01-27 DIAGNOSIS — E785 Hyperlipidemia, unspecified: Secondary | ICD-10-CM | POA: Insufficient documentation

## 2020-01-27 DIAGNOSIS — Z01419 Encounter for gynecological examination (general) (routine) without abnormal findings: Secondary | ICD-10-CM | POA: Diagnosis not present

## 2020-01-27 DIAGNOSIS — N289 Disorder of kidney and ureter, unspecified: Secondary | ICD-10-CM | POA: Diagnosis not present

## 2020-01-27 DIAGNOSIS — Z780 Asymptomatic menopausal state: Secondary | ICD-10-CM

## 2020-01-28 LAB — CBC
Hematocrit: 33.3 % — ABNORMAL LOW (ref 34.0–46.6)
Hemoglobin: 10.5 g/dL — ABNORMAL LOW (ref 11.1–15.9)
MCH: 29.4 pg (ref 26.6–33.0)
MCHC: 31.5 g/dL (ref 31.5–35.7)
MCV: 93 fL (ref 79–97)
Platelets: 276 10*3/uL (ref 150–450)
RBC: 3.57 x10E6/uL — ABNORMAL LOW (ref 3.77–5.28)
RDW: 13.3 % (ref 11.7–15.4)
WBC: 7.8 10*3/uL (ref 3.4–10.8)

## 2020-01-28 LAB — COMPREHENSIVE METABOLIC PANEL
ALT: 12 IU/L (ref 0–32)
AST: 23 IU/L (ref 0–40)
Albumin/Globulin Ratio: 1.9 (ref 1.2–2.2)
Albumin: 4.6 g/dL (ref 3.8–4.8)
Alkaline Phosphatase: 72 IU/L (ref 48–121)
BUN/Creatinine Ratio: 11 — ABNORMAL LOW (ref 12–28)
BUN: 11 mg/dL (ref 8–27)
Bilirubin Total: 0.3 mg/dL (ref 0.0–1.2)
CO2: 24 mmol/L (ref 20–29)
Calcium: 9.6 mg/dL (ref 8.7–10.3)
Chloride: 104 mmol/L (ref 96–106)
Creatinine, Ser: 1.02 mg/dL — ABNORMAL HIGH (ref 0.57–1.00)
GFR calc Af Amer: 64 mL/min/{1.73_m2} (ref >59)
GFR calc non Af Amer: 56 mL/min/{1.73_m2} — ABNORMAL LOW (ref >59)
Globulin, Total: 2.4 g/dL (ref 1.5–4.5)
Glucose: 90 mg/dL (ref 65–99)
Potassium: 4.3 mmol/L (ref 3.5–5.2)
Sodium: 144 mmol/L (ref 134–144)
Total Protein: 7 g/dL (ref 6.0–8.5)

## 2020-01-28 LAB — LIPID PANEL
Chol/HDL Ratio: 3.6 ratio (ref 0.0–4.4)
Cholesterol, Total: 231 mg/dL — ABNORMAL HIGH (ref 100–199)
HDL: 65 mg/dL (ref >39)
LDL Chol Calc (NIH): 130 mg/dL — ABNORMAL HIGH (ref 0–99)
Triglycerides: 203 mg/dL — ABNORMAL HIGH (ref 0–149)
VLDL Cholesterol Cal: 36 mg/dL (ref 5–40)

## 2020-02-02 DIAGNOSIS — M79646 Pain in unspecified finger(s): Secondary | ICD-10-CM | POA: Diagnosis not present

## 2020-02-02 DIAGNOSIS — M25642 Stiffness of left hand, not elsewhere classified: Secondary | ICD-10-CM | POA: Diagnosis not present

## 2020-02-02 DIAGNOSIS — R208 Other disturbances of skin sensation: Secondary | ICD-10-CM | POA: Diagnosis not present

## 2020-02-02 DIAGNOSIS — M349 Systemic sclerosis, unspecified: Secondary | ICD-10-CM | POA: Diagnosis not present

## 2020-02-02 DIAGNOSIS — R29898 Other symptoms and signs involving the musculoskeletal system: Secondary | ICD-10-CM | POA: Diagnosis not present

## 2020-02-02 DIAGNOSIS — M79643 Pain in unspecified hand: Secondary | ICD-10-CM | POA: Diagnosis not present

## 2020-02-02 DIAGNOSIS — M25641 Stiffness of right hand, not elsewhere classified: Secondary | ICD-10-CM | POA: Diagnosis not present

## 2020-02-07 DIAGNOSIS — M62838 Other muscle spasm: Secondary | ICD-10-CM | POA: Diagnosis not present

## 2020-02-07 DIAGNOSIS — M6289 Other specified disorders of muscle: Secondary | ICD-10-CM | POA: Diagnosis not present

## 2020-02-07 DIAGNOSIS — M6281 Muscle weakness (generalized): Secondary | ICD-10-CM | POA: Diagnosis not present

## 2020-02-07 DIAGNOSIS — R3982 Chronic bladder pain: Secondary | ICD-10-CM | POA: Diagnosis not present

## 2020-02-07 DIAGNOSIS — R102 Pelvic and perineal pain: Secondary | ICD-10-CM | POA: Diagnosis not present

## 2020-02-10 ENCOUNTER — Other Ambulatory Visit: Payer: Self-pay

## 2020-02-10 ENCOUNTER — Ambulatory Visit
Admission: RE | Admit: 2020-02-10 | Discharge: 2020-02-10 | Disposition: A | Discharge status: Home / Self Care | Payer: Medicare Other | Source: Ambulatory Visit | Attending: Oncology | Admitting: Oncology

## 2020-02-10 DIAGNOSIS — Z170 Estrogen receptor positive status [ER+]: Secondary | ICD-10-CM

## 2020-02-10 DIAGNOSIS — Z853 Personal history of malignant neoplasm of breast: Secondary | ICD-10-CM | POA: Diagnosis not present

## 2020-02-10 DIAGNOSIS — C50412 Malignant neoplasm of upper-outer quadrant of left female breast: Secondary | ICD-10-CM

## 2020-02-10 MED ORDER — GADOBUTROL 1 MMOL/ML IV SOLN
7.0000 mL | Freq: Once | INTRAVENOUS | Status: AC | PRN
  Administered 2020-02-10: 7 mL via INTRAVENOUS

## 2020-02-14 ENCOUNTER — Other Ambulatory Visit: Payer: Self-pay | Admitting: *Deleted

## 2020-02-14 DIAGNOSIS — Z170 Estrogen receptor positive status [ER+]: Secondary | ICD-10-CM

## 2020-02-14 DIAGNOSIS — M25642 Stiffness of left hand, not elsewhere classified: Secondary | ICD-10-CM | POA: Diagnosis not present

## 2020-02-14 DIAGNOSIS — M25641 Stiffness of right hand, not elsewhere classified: Secondary | ICD-10-CM | POA: Diagnosis not present

## 2020-02-14 DIAGNOSIS — C50412 Malignant neoplasm of upper-outer quadrant of left female breast: Secondary | ICD-10-CM

## 2020-02-14 NOTE — Progress Notes (Signed)
ID: [name]   DOB: 04-28-1951  MR#: [id number]  CVE#:[id number]   PCP: Marton Redwood, MD Patient Care Team: Marton Redwood, MD as PCP - General (Internal Medicine) Trevar Boehringer, Virgie Dad, MD (Hematology and Oncology) Alda Berthold, DO as Consulting Physician (Neurology) Rod Holler, MD Lucy Antigua, MD as Referring Physician (Rheumatology) Charlie Pitter, MD as Referring Physician (Pulmonary Disease) Megan Salon, MD as Consulting Physician (Gynecology) Artis Delay, MD as Referring Physician (Internal Medicine) Jarome Matin, MD as Consulting Physician (Dermatology) Megan Salon, MD as Consulting Physician (Gynecology)   CHIEF COMPLAINT: Estrogen receptor positive breast cancer  CURRENT TREATMENT: Tamoxifen; scleroderma   INTERVAL HISTORY: Canyon returns today for follow-up of her estrogen receptor positive breast cancer.  She continues on tamoxifen. She notes that she has some difficulty swallowing the tamoxifen as it is somewhat chalky. She has some mild hot flashes.   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  Accordingly she is followed with mammography but with breast MRI.    Since her last visit here, she underwent a bilateral breast MRI with and without contrast on 02/10/2020 showing: Breast Density Category C. There is no MRI malignancy in either breast.    REVIEW OF SYSTEMS:  Dannelle is followed at [facility] and [facility] for the scleroderma and also has a hand team here in town who have encouraged her to continue physical therapies.  All of this is helping her maintain a good functional status and overall she feels she is doing "pretty well".  She does have some leg cramps and some tightening in the legs which she feels is new and which she has been told might possibly be related to tamoxifen although that would be unusual.  She does have minimal hot flashes.  The vaginal wetness she was having previously  has resolved.  She is trying a CBD oil with some success for pelvic issues.  Aside from that a detailed review of systems today was stable   BREAST CANCER HISTORY: From the original intake note:  The patient had routine screening mammography at [facility] 08/27/2011, suggesting a potential abnormality in the left breast. Additional views 08/29/2011 confirmed an irregular mass with architectural or distortion measuring up to 4 cm in the left breast, without associated microcalcifications. By ultrasound this was irregular, and showed posterior shadowing. It measured 3.6 cm sonographically. There were no suspicious lymph nodes noted in the left axilla.  The same day the patient underwent ultrasound-guided biopsy of the left breast mass, showing ([id number]) and invasive ductal carcinoma, grade 1, which was 100% estrogen receptor and 97% progesterone receptor positive. The proliferation marker was 20%. There was no HER-2 amplification with a ratio by CISH of 1.09.  The patient had bilateral breast MRI 09/03/2011 showing a solitary 3.8 cm left breast lesion, and on 11/04/2011 underwent left lumpectomy and sentinel lymph node sampling with results and subsequent history as detailed below.   PAST MEDICAL HISTORY: Past Medical History:  Diagnosis Date  . Anemia   . 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 Colon polyp   . 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 Depression   .  Esophageal stricture   . GERD (gastroesophageal reflux disease)   . Hiatal hernia   . History of external beam radiation therapy 11-25-2011 to 01-08-2012   left breast 45 Gy at 1.8 per fraction x25 fractions, boost  16 Gy at 2 per fraction x8 fractions  . Hyperlipidemia   . Hypothyroidism   . Internal hemorrhoids   . Osteoarthritis   . Osteopenia   . Positive ANA (antinuclear antibody)   . Rash    arms and legs caused by skin scleroderma  . Raynaud's syndrome   . 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 Ruthann Cancer at [facility]   systemic sclerosis , diffuse/   rheumotologist:  dr Manuella Ghazi at [facility]---  [place] 07-22-2017 (as of 07-25-2017 note not available to read)---  per pt has appt. w/ specialist at [facility]  . Skin thickening    hands, arms, legs  caused by scleroderma  . 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 BIOPSY  01/29/2018   Procedure: BIOPSY;  Surgeon: Jerene Bears, MD;  Location: [facility];  Service: Gastroenterology;;  . Lillard Anes  2000   Left  . COLONOSCOPY WITH PROPOFOL N/A 01/29/2018   Procedure: COLONOSCOPY WITH PROPOFOL;  Surgeon: Jerene Bears, MD;  Location: [facility];  Service: Gastroenterology;  Laterality: N/A;  . DILATATION & CURETTAGE/HYSTEROSCOPY WITH MYOSURE N/A 08/04/2017   Procedure: DILATATION & CURETTAGE/HYSTEROSCOPY WITH MYOSURE;  Surgeon: Megan Salon, MD;  Location: [facility];  Service: Gynecology;  Laterality: N/A;  . ECTOPIC PREGNANCY SURGERY  1986-88   s/p unilateral salpingectomy  . ESOPHAGOGASTRODUODENOSCOPY (EGD) WITH PROPOFOL N/A 01/29/2018   Procedure: ESOPHAGOGASTRODUODENOSCOPY (EGD) WITH PROPOFOL;  Surgeon: Jerene Bears, MD;  Location: [facility];  Service: Gastroenterology;  Laterality: N/A;  . PARTIAL MASTECTOMY WITH AXILLARY SENTINEL LYMPH NODE BIOPSY Left 11-04-2011   dr Margot Chimes  [facility]  . TRANSTHORACIC ECHOCARDIOGRAM  06-04-2017    [facility]   moderate LVH, ef>55%/  trivial AR and TR/ mild MR and PR/  trivial pericardial effusion  . WISDOM TOOTH EXTRACTION      FAMILY HISTORY Family History  Problem Relation Age of Onset  . Heart disease Mother   . Heart  failure Mother   . Heart disease Father   . Ovarian cancer Maternal Aunt   . Heart disease Paternal Uncle        multiple  . Autoimmune disease Sister   . Arrhythmia Sister   . Anemia Sister   . CAD Brother   . Colon cancer Neg Hx   . Stomach cancer Neg Hx   . Esophageal cancer Neg Hx   . Rectal cancer Neg Hx    The patient's father died at the age of 18 from myocardial infarction. The patient's mother died from congestive heart failure at the age of 65. The patient has one brother age 52 with prostate cancer diagnosed at age 55. The patient's sister is 54 as of 2013. There is no breast or ovarian cancer in the family   GYNECOLOGIC HISTORY:  (Reviewed 12/27/2013) She had menarche at around age 56. She is GX P1, first pregnancy to term age 14. She stopped having periods about 2003. She did not use hormone replacement   SOCIAL HISTORY:  (Updated 02/15/2019). 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 Her husband  Rush Landmark  has had prostate cancer, status post radiation treatments in [place] about 7 years ago. Her daughter Lanelle Bal lives in [place] with her husband and 2 children. 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 The patient attends the [employer].   ADVANCED DIRECTIVES: in place   HEALTH MAINTENANCE: Social History   Tobacco Use  . Smoking status: Never Smoker  . Smokeless tobacco: Never Used  Vaping Use  . Vaping Use: Never used  Substance Use Topics  . Alcohol use: Yes    Comment: Drinks one mixed drink nightly  . Drug use: No     Colonoscopy: repeat due 2015  PAP: UTD (Richard Edwyna Shell)  Bone density: June 2013, osteopenia with T score -1.4  Lipid panel: June 2014  Allergies  Allergen Reactions  . Betadine [Povidone Iodine] Rash  . Contrast Media [Iodinated Diagnostic Agents] Rash    "per pt recently had CT  08/ 2018 even through given pre-medication, IVP still caused rash"  . Epinephrine Other (See Comments)    Sever headaches  . Oxycodone Anxiety  . Nickel Rash and Other (See Comments)    Rash and blisters  . Other Rash and Other (See Comments)    Hospital gown  . Penicillins Rash and Other (See Comments)    Rash and blisters Has patient had a PCN reaction causing immediate rash, facial/tongue/throat swelling, SOB or lightheadedness with hypotension: Yes Has patient had a PCN reaction causing severe rash involving mucus membranes or skin necrosis: No Has patient had a PCN reaction that required hospitalization: No Has patient had a PCN reaction occurring within the last 10 years: No If all of the above answers are "NO", then may proceed with Cephalosporin use.   . Sulfa Antibiotics Rash    Current Outpatient Medications  Medication Sig Dispense Refill  . ELDERBERRY PO Take by mouth 2 (two) times daily.    . furosemide (LASIX) 20 MG tablet Take 20 mg by mouth daily.     [name] gabapentin (NEURONTIN) 100 MG capsule Take 100-200 mg by mouth See admin instructions. Take '100mg'$  by mouth in the morning and afternoon and '100mg'$  at night  3  . mycophenolate (CELLCEPT) 250 MG capsule Take 6 capsules (1,500 mg total) by mouth 2 (two) times daily.    . pantoprazole (PROTONIX) 40 MG tablet TAKE 1 TABLET BY MOUTH TWICE DAILY BEFORE A MEAL. NEEDS OFFICE VISIT FOR ADDITIONAL REFILLS 180 tablet 0  . SYNTHROID 100 MCG tablet Take 100 mcg by mouth daily before breakfast.      No current facility-administered medications for this visit.    OBJECTIVE: White woman who appears younger than stated age  33:   02/15/20 1405  BP: 134/71  Pulse: 82  Resp: 20  Temp: 98 F (36.7 C)  SpO2: 100%     Body mass index is 27.11 kg/m.    ECOG FS: 1 Filed Weights   02/15/20 1405  Weight: 148 lb 3.2 oz (67.2 kg)    Sclerae unicteric, EOMs intact Wearing a mask No cervical or supraclavicular adenopathy Lungs  no rales or rhonchi Heart regular rate and rhythm Abd soft, nontender, positive bowel sounds MSK no focal spinal tenderness, limited range of motion both hands  Neuro: nonfocal, well oriented, appropriate affect Breasts: The right breast is benign.  The left breast is filled with no evidence of recurrence and a slightly retracted nipple is stable.  Both axillae are benign.   LAB RESULTS: Lab Results  Component Value  Date   WBC 7.7 02/15/2020   NEUTROABS 5.9 02/15/2020   HGB 10.4 (L) 02/15/2020   HCT 32.0 (L) 02/15/2020   MCV 90.9 02/15/2020   PLT 247 02/15/2020      Chemistry      Component Value Date/Time   NA 144 01/27/2020 1148   NA 141 06/09/2017 1117   K 4.3 01/27/2020 1148   K 3.9 06/09/2017 1117   CL 104 01/27/2020 1148   CL 109 (H) 12/14/2012 1253   CO2 24 01/27/2020 1148   CO2 23 06/09/2017 1117   BUN 11 01/27/2020 1148   BUN 11.5 06/09/2017 1117   CREATININE 1.02 (H) 01/27/2020 1148   CREATININE 1.2 (H) 06/09/2017 1117      Component Value Date/Time   CALCIUM 9.6 01/27/2020 1148   CALCIUM 8.9 06/09/2017 1117   ALKPHOS 72 01/27/2020 1148   ALKPHOS 40 06/09/2017 1117   AST 23 01/27/2020 1148   AST 35 (H) 06/09/2017 1117   ALT 12 01/27/2020 1148   ALT 19 06/09/2017 1117   BILITOT 0.3 01/27/2020 1148   BILITOT 0.37 06/09/2017 1117       STUDIES: MR BREAST BILATERAL W WO CONTRAST INC CAD  Result Date: 02/10/2020 CLINICAL DATA:  69 year old female for screening breast MRI. Personal history of LEFT breast cancer and lumpectomy in 2013. Screening breast MRI performed in lieu of screening mammography due to scleroderma and radiation risks. LABS:  None performed today EXAM: BILATERAL BREAST MRI WITH AND WITHOUT CONTRAST TECHNIQUE: Multiplanar, multisequence MR images of both breasts were obtained prior to and following the intravenous administration of 7 ml of Gadavist Three-dimensional MR images were rendered by post-processing of the original MR data on an  independent workstation. The three-dimensional MR images were interpreted, and findings are reported in the following complete MRI report for this study. Three dimensional images were evaluated at the independent DynaCad workstation COMPARISON:  Previous exam(s) including 2019 and 2020 MRs. FINDINGS: Breast composition: c. Heterogeneous fibroglandular tissue. Background parenchymal enhancement: Mild Right breast: No mass or abnormal enhancement. Left breast: No mass or abnormal enhancement. LEFT lumpectomy changes again noted. Lymph nodes: No abnormal appearing lymph nodes. Ancillary findings:  None. IMPRESSION: 1. No MR evidence of breast malignancy. 2. LEFT lumpectomy changes. RECOMMENDATION: Bilateral screening breast MRI in 1 year. BI-RADS CATEGORY  2: Benign. Electronically Signed   By: Margarette Canada M.D.   On: 02/10/2020 11:05     ASSESSMENT: 69 y.o.  [name] woman   (1)  status post left lumpectomy 11/04/2011 for a pT2 pN0, stage IIA invasive ductal carcinoma, grade 2, strongly estrogen and progesterone receptor positive, HER-2 negative, with an MIB-1 of 20%.   (2)  completed radiation treatments June 2013    (3)  started tamoxifen mid-July 2013, to be continued until 2023  (a) endometrial thickening, status post multiple biopsies 04/04/2017 showing no atypia dysplasia or malignancy  (b) D&C with hysteroscopy 08/04/2017 showed inactive endometrium and a benign polyp  (4) breast density category C: followed with yearly MRI (due to scleroderma)  (a) status post breast MRI in April 2015 showing 2 abnormalities in the right breast; biopsy of both spots 11/19/2013 showed only fibrocystic PASH   (5) osteopenia with a T score of -1.5 at the left femoral neck 04/18/2014  (a) on vitamin D supplementation, weightbearing exercise program, and tamoxifen  (6) scleroderma, with significant arthritis, thickened digits, lung changes, and swallowing difficulties  (a) on mycophenolate per [facility] protocol   PLAN:  Azarya  is now a little over 8 years out from definitive surgery for her breast cancer with no evidence of disease recurrence.  This is favorable.  The question is whether to continue tamoxifen.  She tolerates it well, although she is having what might be considered mild hot flashes.  She has been experiencing worsening pain in her legs and some of her physicians have suggested that possibly tamoxifen could be related to that.  That would be quite unusual but certainly not impossible.  Another way of looking at it is that we do get the most benefit from tamoxifen in the 1st 5years.  We get a of an additional 2 to 3% risk reduction by going on additional 5 years and she has already had 3 of those 5 years  In short I am quite comfortable with her stopping tamoxifen even though I do not think it will make much difference to the lower extremity pain.  Certainly it takes about 4 months for tamoxifen to be fully out of 1 system so she will not really know until December or January.  If at that time she would like to resume tamoxifen we could do it at the same or at a lower dose or I am equally comfortable continuing observation alone.  A 2nd issue is axis.  This is getting harder and harder for her as the scleroderma progresses.  When she gets the breast MRI they have to "stick her" many times.  I think we can help out with that.  We will schedule an MRI of the breast again for next July and once she has the date she will simply come here earlier and we will get her accessed through our phlebotomist's, who are much more experienced people with difficult veins  Otherwise encouraged her to get her colonoscopy sooner than later and I will see her again in 1 year  Total encounter time 30 min.*   Ismael Treptow, Virgie Dad, MD  02/15/20 3:01 PM Medical Oncology and Hematology [facility] [place], [place] [address] Tel. [phone]    Fax. [phone]     I, Wilburn Mylar, am acting as scribe for Dr. Virgie Dad. Nova Evett.  I, Lurline Del MD, have reviewed the above documentation for accuracy and completeness, and I agree with the above.    *Total Encounter Time as defined by the Centers for Medicare and Medicaid Services includes, in addition to the face-to-face time of a patient visit (documented in the note above) non-face-to-face time: obtaining and reviewing outside history, ordering and reviewing medications, tests or procedures, care coordination (communications with other health care professionals or caregivers) and documentation in the medical record.

## 2020-02-15 ENCOUNTER — Inpatient Hospital Stay: Payer: Medicare Other | Attending: Oncology

## 2020-02-15 ENCOUNTER — Inpatient Hospital Stay (HOSPITAL_BASED_OUTPATIENT_CLINIC_OR_DEPARTMENT_OTHER): Payer: Medicare Other | Admitting: Oncology

## 2020-02-15 ENCOUNTER — Other Ambulatory Visit: Payer: Self-pay

## 2020-02-15 VITALS — BP 134/71 | HR 82 | Temp 98.0°F | Resp 20 | Ht 62.0 in | Wt 148.2 lb

## 2020-02-15 DIAGNOSIS — K219 Gastro-esophageal reflux disease without esophagitis: Secondary | ICD-10-CM | POA: Diagnosis not present

## 2020-02-15 DIAGNOSIS — K449 Diaphragmatic hernia without obstruction or gangrene: Secondary | ICD-10-CM | POA: Diagnosis not present

## 2020-02-15 DIAGNOSIS — E785 Hyperlipidemia, unspecified: Secondary | ICD-10-CM | POA: Diagnosis not present

## 2020-02-15 DIAGNOSIS — E039 Hypothyroidism, unspecified: Secondary | ICD-10-CM | POA: Insufficient documentation

## 2020-02-15 DIAGNOSIS — Z170 Estrogen receptor positive status [ER+]: Secondary | ICD-10-CM

## 2020-02-15 DIAGNOSIS — R21 Rash and other nonspecific skin eruption: Secondary | ICD-10-CM | POA: Insufficient documentation

## 2020-02-15 DIAGNOSIS — I7300 Raynaud's syndrome without gangrene: Secondary | ICD-10-CM | POA: Diagnosis not present

## 2020-02-15 DIAGNOSIS — Z86010 Personal history of colonic polyps: Secondary | ICD-10-CM | POA: Diagnosis not present

## 2020-02-15 DIAGNOSIS — Z79810 Long term (current) use of selective estrogen receptor modulators (SERMs): Secondary | ICD-10-CM | POA: Insufficient documentation

## 2020-02-15 DIAGNOSIS — M349 Systemic sclerosis, unspecified: Secondary | ICD-10-CM | POA: Diagnosis not present

## 2020-02-15 DIAGNOSIS — Z923 Personal history of irradiation: Secondary | ICD-10-CM | POA: Diagnosis not present

## 2020-02-15 DIAGNOSIS — R922 Inconclusive mammogram: Secondary | ICD-10-CM

## 2020-02-15 DIAGNOSIS — R232 Flushing: Secondary | ICD-10-CM | POA: Diagnosis not present

## 2020-02-15 DIAGNOSIS — Z79899 Other long term (current) drug therapy: Secondary | ICD-10-CM | POA: Diagnosis not present

## 2020-02-15 DIAGNOSIS — C50412 Malignant neoplasm of upper-outer quadrant of left female breast: Secondary | ICD-10-CM

## 2020-02-15 DIAGNOSIS — F329 Major depressive disorder, single episode, unspecified: Secondary | ICD-10-CM | POA: Diagnosis not present

## 2020-02-15 DIAGNOSIS — M8580 Other specified disorders of bone density and structure, unspecified site: Secondary | ICD-10-CM

## 2020-02-15 LAB — CBC WITH DIFFERENTIAL (CANCER CENTER ONLY)
Abs Immature Granulocytes: 0.03 10*3/uL (ref 0.00–0.07)
Basophils Absolute: 0.1 10*3/uL (ref 0.0–0.1)
Basophils Relative: 1 %
Eosinophils Absolute: 0.1 10*3/uL (ref 0.0–0.5)
Eosinophils Relative: 1 %
HCT: 32 % — ABNORMAL LOW (ref 36.0–46.0)
Hemoglobin: 10.4 g/dL — ABNORMAL LOW (ref 12.0–15.0)
Immature Granulocytes: 0 %
Lymphocytes Relative: 13 %
Lymphs Abs: 1 10*3/uL (ref 0.7–4.0)
MCH: 29.5 pg (ref 26.0–34.0)
MCHC: 32.5 g/dL (ref 30.0–36.0)
MCV: 90.9 fL (ref 80.0–100.0)
Monocytes Absolute: 0.7 10*3/uL (ref 0.1–1.0)
Monocytes Relative: 9 %
Neutro Abs: 5.9 10*3/uL (ref 1.7–7.7)
Neutrophils Relative %: 76 %
Platelet Count: 247 10*3/uL (ref 150–400)
RBC: 3.52 MIL/uL — ABNORMAL LOW (ref 3.87–5.11)
RDW: 13.6 % (ref 11.5–15.5)
WBC Count: 7.7 10*3/uL (ref 4.0–10.5)
nRBC: 0 % (ref 0.0–0.2)

## 2020-02-15 LAB — CMP (CANCER CENTER ONLY)
ALT: 12 U/L (ref 0–44)
AST: 23 U/L (ref 15–41)
Albumin: 4 g/dL (ref 3.5–5.0)
Alkaline Phosphatase: 69 U/L (ref 38–126)
Anion gap: 9 (ref 5–15)
BUN: 8 mg/dL (ref 8–23)
CO2: 24 mmol/L (ref 22–32)
Calcium: 9.4 mg/dL (ref 8.9–10.3)
Chloride: 105 mmol/L (ref 98–111)
Creatinine: 1.16 mg/dL — ABNORMAL HIGH (ref 0.44–1.00)
GFR, Est AFR Am: 55 mL/min — ABNORMAL LOW (ref >60)
GFR, Estimated: 48 mL/min — ABNORMAL LOW (ref >60)
Glucose, Bld: 99 mg/dL (ref 70–99)
Potassium: 4 mmol/L (ref 3.5–5.1)
Sodium: 138 mmol/L (ref 135–145)
Total Bilirubin: 0.4 mg/dL (ref 0.3–1.2)
Total Protein: 6.7 g/dL (ref 6.5–8.1)

## 2020-02-21 ENCOUNTER — Ambulatory Visit: Payer: Medicare Other | Admitting: Oncology

## 2020-02-21 ENCOUNTER — Other Ambulatory Visit: Payer: Medicare Other

## 2020-02-21 DIAGNOSIS — M6289 Other specified disorders of muscle: Secondary | ICD-10-CM | POA: Diagnosis not present

## 2020-02-21 DIAGNOSIS — R102 Pelvic and perineal pain: Secondary | ICD-10-CM | POA: Diagnosis not present

## 2020-02-21 DIAGNOSIS — M62838 Other muscle spasm: Secondary | ICD-10-CM | POA: Diagnosis not present

## 2020-02-21 DIAGNOSIS — M6281 Muscle weakness (generalized): Secondary | ICD-10-CM | POA: Diagnosis not present

## 2020-02-23 DIAGNOSIS — R208 Other disturbances of skin sensation: Secondary | ICD-10-CM | POA: Diagnosis not present

## 2020-02-23 DIAGNOSIS — R29898 Other symptoms and signs involving the musculoskeletal system: Secondary | ICD-10-CM | POA: Diagnosis not present

## 2020-02-23 DIAGNOSIS — M79646 Pain in unspecified finger(s): Secondary | ICD-10-CM | POA: Diagnosis not present

## 2020-02-23 DIAGNOSIS — M79643 Pain in unspecified hand: Secondary | ICD-10-CM | POA: Diagnosis not present

## 2020-02-23 DIAGNOSIS — M25641 Stiffness of right hand, not elsewhere classified: Secondary | ICD-10-CM | POA: Diagnosis not present

## 2020-02-23 DIAGNOSIS — H6982 Other specified disorders of Eustachian tube, left ear: Secondary | ICD-10-CM | POA: Diagnosis not present

## 2020-02-23 DIAGNOSIS — M349 Systemic sclerosis, unspecified: Secondary | ICD-10-CM | POA: Diagnosis not present

## 2020-02-23 DIAGNOSIS — M25642 Stiffness of left hand, not elsewhere classified: Secondary | ICD-10-CM | POA: Diagnosis not present

## 2020-02-28 DIAGNOSIS — M25642 Stiffness of left hand, not elsewhere classified: Secondary | ICD-10-CM | POA: Diagnosis not present

## 2020-02-28 DIAGNOSIS — M25641 Stiffness of right hand, not elsewhere classified: Secondary | ICD-10-CM | POA: Diagnosis not present

## 2020-03-06 DIAGNOSIS — R102 Pelvic and perineal pain: Secondary | ICD-10-CM | POA: Diagnosis not present

## 2020-03-06 DIAGNOSIS — M62838 Other muscle spasm: Secondary | ICD-10-CM | POA: Diagnosis not present

## 2020-03-06 DIAGNOSIS — M6281 Muscle weakness (generalized): Secondary | ICD-10-CM | POA: Diagnosis not present

## 2020-03-06 DIAGNOSIS — N3021 Other chronic cystitis with hematuria: Secondary | ICD-10-CM | POA: Diagnosis not present

## 2020-03-06 DIAGNOSIS — M6289 Other specified disorders of muscle: Secondary | ICD-10-CM | POA: Diagnosis not present

## 2020-03-07 DIAGNOSIS — J849 Interstitial pulmonary disease, unspecified: Secondary | ICD-10-CM | POA: Diagnosis not present

## 2020-03-07 DIAGNOSIS — K2100 Gastro-esophageal reflux disease with esophagitis, without bleeding: Secondary | ICD-10-CM | POA: Diagnosis not present

## 2020-03-07 DIAGNOSIS — R0602 Shortness of breath: Secondary | ICD-10-CM | POA: Diagnosis not present

## 2020-03-07 DIAGNOSIS — M349 Systemic sclerosis, unspecified: Secondary | ICD-10-CM | POA: Diagnosis not present

## 2020-03-07 DIAGNOSIS — I082 Rheumatic disorders of both aortic and tricuspid valves: Secondary | ICD-10-CM | POA: Diagnosis not present

## 2020-03-07 DIAGNOSIS — Z79899 Other long term (current) drug therapy: Secondary | ICD-10-CM | POA: Diagnosis not present

## 2020-03-07 DIAGNOSIS — G5600 Carpal tunnel syndrome, unspecified upper limb: Secondary | ICD-10-CM | POA: Diagnosis not present

## 2020-03-09 DIAGNOSIS — M79643 Pain in unspecified hand: Secondary | ICD-10-CM | POA: Diagnosis not present

## 2020-03-09 DIAGNOSIS — R208 Other disturbances of skin sensation: Secondary | ICD-10-CM | POA: Diagnosis not present

## 2020-03-09 DIAGNOSIS — M25642 Stiffness of left hand, not elsewhere classified: Secondary | ICD-10-CM | POA: Diagnosis not present

## 2020-03-09 DIAGNOSIS — M25641 Stiffness of right hand, not elsewhere classified: Secondary | ICD-10-CM | POA: Diagnosis not present

## 2020-03-09 DIAGNOSIS — R29898 Other symptoms and signs involving the musculoskeletal system: Secondary | ICD-10-CM | POA: Diagnosis not present

## 2020-03-09 DIAGNOSIS — M79646 Pain in unspecified finger(s): Secondary | ICD-10-CM | POA: Diagnosis not present

## 2020-03-09 DIAGNOSIS — M349 Systemic sclerosis, unspecified: Secondary | ICD-10-CM | POA: Diagnosis not present

## 2020-03-15 DIAGNOSIS — M349 Systemic sclerosis, unspecified: Secondary | ICD-10-CM | POA: Diagnosis not present

## 2020-03-15 DIAGNOSIS — M25642 Stiffness of left hand, not elsewhere classified: Secondary | ICD-10-CM | POA: Diagnosis not present

## 2020-03-15 DIAGNOSIS — R208 Other disturbances of skin sensation: Secondary | ICD-10-CM | POA: Diagnosis not present

## 2020-03-15 DIAGNOSIS — M79646 Pain in unspecified finger(s): Secondary | ICD-10-CM | POA: Diagnosis not present

## 2020-03-15 DIAGNOSIS — R29898 Other symptoms and signs involving the musculoskeletal system: Secondary | ICD-10-CM | POA: Diagnosis not present

## 2020-03-15 DIAGNOSIS — M25641 Stiffness of right hand, not elsewhere classified: Secondary | ICD-10-CM | POA: Diagnosis not present

## 2020-03-15 DIAGNOSIS — M79643 Pain in unspecified hand: Secondary | ICD-10-CM | POA: Diagnosis not present

## 2020-03-27 DIAGNOSIS — M6281 Muscle weakness (generalized): Secondary | ICD-10-CM | POA: Diagnosis not present

## 2020-03-27 DIAGNOSIS — R102 Pelvic and perineal pain: Secondary | ICD-10-CM | POA: Diagnosis not present

## 2020-03-27 DIAGNOSIS — M62838 Other muscle spasm: Secondary | ICD-10-CM | POA: Diagnosis not present

## 2020-03-27 DIAGNOSIS — R3982 Chronic bladder pain: Secondary | ICD-10-CM | POA: Diagnosis not present

## 2020-03-27 DIAGNOSIS — M349 Systemic sclerosis, unspecified: Secondary | ICD-10-CM | POA: Diagnosis not present

## 2020-03-27 DIAGNOSIS — M6289 Other specified disorders of muscle: Secondary | ICD-10-CM | POA: Diagnosis not present

## 2020-04-05 DIAGNOSIS — Z85828 Personal history of other malignant neoplasm of skin: Secondary | ICD-10-CM | POA: Diagnosis not present

## 2020-04-05 DIAGNOSIS — D225 Melanocytic nevi of trunk: Secondary | ICD-10-CM | POA: Diagnosis not present

## 2020-04-05 DIAGNOSIS — L821 Other seborrheic keratosis: Secondary | ICD-10-CM | POA: Diagnosis not present

## 2020-04-05 DIAGNOSIS — R29898 Other symptoms and signs involving the musculoskeletal system: Secondary | ICD-10-CM | POA: Diagnosis not present

## 2020-04-05 DIAGNOSIS — L718 Other rosacea: Secondary | ICD-10-CM | POA: Diagnosis not present

## 2020-04-05 DIAGNOSIS — D2271 Melanocytic nevi of right lower limb, including hip: Secondary | ICD-10-CM | POA: Diagnosis not present

## 2020-04-05 DIAGNOSIS — M25641 Stiffness of right hand, not elsewhere classified: Secondary | ICD-10-CM | POA: Diagnosis not present

## 2020-04-05 DIAGNOSIS — M25642 Stiffness of left hand, not elsewhere classified: Secondary | ICD-10-CM | POA: Diagnosis not present

## 2020-04-05 DIAGNOSIS — R208 Other disturbances of skin sensation: Secondary | ICD-10-CM | POA: Diagnosis not present

## 2020-04-05 DIAGNOSIS — M349 Systemic sclerosis, unspecified: Secondary | ICD-10-CM | POA: Diagnosis not present

## 2020-04-05 DIAGNOSIS — L814 Other melanin hyperpigmentation: Secondary | ICD-10-CM | POA: Diagnosis not present

## 2020-04-06 DIAGNOSIS — R3982 Chronic bladder pain: Secondary | ICD-10-CM | POA: Diagnosis not present

## 2020-04-06 DIAGNOSIS — N3021 Other chronic cystitis with hematuria: Secondary | ICD-10-CM | POA: Diagnosis not present

## 2020-04-06 DIAGNOSIS — M6281 Muscle weakness (generalized): Secondary | ICD-10-CM | POA: Diagnosis not present

## 2020-04-06 DIAGNOSIS — M62838 Other muscle spasm: Secondary | ICD-10-CM | POA: Diagnosis not present

## 2020-04-06 DIAGNOSIS — M349 Systemic sclerosis, unspecified: Secondary | ICD-10-CM | POA: Diagnosis not present

## 2020-04-06 DIAGNOSIS — R102 Pelvic and perineal pain: Secondary | ICD-10-CM | POA: Diagnosis not present

## 2020-04-06 DIAGNOSIS — M6289 Other specified disorders of muscle: Secondary | ICD-10-CM | POA: Diagnosis not present

## 2020-04-10 DIAGNOSIS — E039 Hypothyroidism, unspecified: Secondary | ICD-10-CM | POA: Diagnosis not present

## 2020-04-10 DIAGNOSIS — M62838 Other muscle spasm: Secondary | ICD-10-CM | POA: Diagnosis not present

## 2020-04-10 DIAGNOSIS — M349 Systemic sclerosis, unspecified: Secondary | ICD-10-CM | POA: Diagnosis not present

## 2020-04-10 DIAGNOSIS — M6281 Muscle weakness (generalized): Secondary | ICD-10-CM | POA: Diagnosis not present

## 2020-04-10 DIAGNOSIS — Z853 Personal history of malignant neoplasm of breast: Secondary | ICD-10-CM | POA: Diagnosis not present

## 2020-04-10 DIAGNOSIS — R102 Pelvic and perineal pain: Secondary | ICD-10-CM | POA: Diagnosis not present

## 2020-04-10 DIAGNOSIS — K219 Gastro-esophageal reflux disease without esophagitis: Secondary | ICD-10-CM | POA: Diagnosis not present

## 2020-04-10 DIAGNOSIS — N3021 Other chronic cystitis with hematuria: Secondary | ICD-10-CM | POA: Diagnosis not present

## 2020-04-10 DIAGNOSIS — M6289 Other specified disorders of muscle: Secondary | ICD-10-CM | POA: Diagnosis not present

## 2020-04-10 DIAGNOSIS — R3121 Asymptomatic microscopic hematuria: Secondary | ICD-10-CM | POA: Diagnosis not present

## 2020-04-10 DIAGNOSIS — R3982 Chronic bladder pain: Secondary | ICD-10-CM | POA: Diagnosis not present

## 2020-04-12 DIAGNOSIS — M25642 Stiffness of left hand, not elsewhere classified: Secondary | ICD-10-CM | POA: Diagnosis not present

## 2020-04-12 DIAGNOSIS — M349 Systemic sclerosis, unspecified: Secondary | ICD-10-CM | POA: Diagnosis not present

## 2020-04-12 DIAGNOSIS — R29898 Other symptoms and signs involving the musculoskeletal system: Secondary | ICD-10-CM | POA: Diagnosis not present

## 2020-04-12 DIAGNOSIS — M79646 Pain in unspecified finger(s): Secondary | ICD-10-CM | POA: Diagnosis not present

## 2020-04-12 DIAGNOSIS — M25641 Stiffness of right hand, not elsewhere classified: Secondary | ICD-10-CM | POA: Diagnosis not present

## 2020-04-12 DIAGNOSIS — M79643 Pain in unspecified hand: Secondary | ICD-10-CM | POA: Diagnosis not present

## 2020-04-12 DIAGNOSIS — R208 Other disturbances of skin sensation: Secondary | ICD-10-CM | POA: Diagnosis not present

## 2020-04-19 DIAGNOSIS — M25641 Stiffness of right hand, not elsewhere classified: Secondary | ICD-10-CM | POA: Diagnosis not present

## 2020-04-19 DIAGNOSIS — M25642 Stiffness of left hand, not elsewhere classified: Secondary | ICD-10-CM | POA: Diagnosis not present

## 2020-04-24 DIAGNOSIS — M6289 Other specified disorders of muscle: Secondary | ICD-10-CM | POA: Diagnosis not present

## 2020-04-24 DIAGNOSIS — M25552 Pain in left hip: Secondary | ICD-10-CM | POA: Diagnosis not present

## 2020-04-24 DIAGNOSIS — R102 Pelvic and perineal pain: Secondary | ICD-10-CM | POA: Diagnosis not present

## 2020-04-24 DIAGNOSIS — M62838 Other muscle spasm: Secondary | ICD-10-CM | POA: Diagnosis not present

## 2020-04-24 DIAGNOSIS — M6281 Muscle weakness (generalized): Secondary | ICD-10-CM | POA: Diagnosis not present

## 2020-04-26 DIAGNOSIS — R29898 Other symptoms and signs involving the musculoskeletal system: Secondary | ICD-10-CM | POA: Diagnosis not present

## 2020-04-26 DIAGNOSIS — M25642 Stiffness of left hand, not elsewhere classified: Secondary | ICD-10-CM | POA: Diagnosis not present

## 2020-04-26 DIAGNOSIS — M79646 Pain in unspecified finger(s): Secondary | ICD-10-CM | POA: Diagnosis not present

## 2020-04-26 DIAGNOSIS — M25641 Stiffness of right hand, not elsewhere classified: Secondary | ICD-10-CM | POA: Diagnosis not present

## 2020-04-26 DIAGNOSIS — M3489 Other systemic sclerosis: Secondary | ICD-10-CM | POA: Diagnosis not present

## 2020-04-26 DIAGNOSIS — R208 Other disturbances of skin sensation: Secondary | ICD-10-CM | POA: Diagnosis not present

## 2020-04-26 DIAGNOSIS — M79643 Pain in unspecified hand: Secondary | ICD-10-CM | POA: Diagnosis not present

## 2020-04-27 ENCOUNTER — Ambulatory Visit
Admission: RE | Admit: 2020-04-27 | Discharge: 2020-04-27 | Disposition: A | Discharge status: Home / Self Care | Payer: Medicare Other | Source: Ambulatory Visit | Attending: Obstetrics & Gynecology | Admitting: Obstetrics & Gynecology

## 2020-04-27 ENCOUNTER — Other Ambulatory Visit: Payer: Self-pay

## 2020-04-27 DIAGNOSIS — M8580 Other specified disorders of bone density and structure, unspecified site: Secondary | ICD-10-CM

## 2020-04-27 DIAGNOSIS — M8589 Other specified disorders of bone density and structure, multiple sites: Secondary | ICD-10-CM | POA: Diagnosis not present

## 2020-04-27 DIAGNOSIS — Z780 Asymptomatic menopausal state: Secondary | ICD-10-CM

## 2020-05-08 DIAGNOSIS — M25552 Pain in left hip: Secondary | ICD-10-CM | POA: Diagnosis not present

## 2020-05-08 DIAGNOSIS — M6289 Other specified disorders of muscle: Secondary | ICD-10-CM | POA: Diagnosis not present

## 2020-05-08 DIAGNOSIS — R102 Pelvic and perineal pain: Secondary | ICD-10-CM | POA: Diagnosis not present

## 2020-05-08 DIAGNOSIS — M62838 Other muscle spasm: Secondary | ICD-10-CM | POA: Diagnosis not present

## 2020-05-08 DIAGNOSIS — M6281 Muscle weakness (generalized): Secondary | ICD-10-CM | POA: Diagnosis not present

## 2020-05-10 DIAGNOSIS — M79643 Pain in unspecified hand: Secondary | ICD-10-CM | POA: Diagnosis not present

## 2020-05-10 DIAGNOSIS — M79646 Pain in unspecified finger(s): Secondary | ICD-10-CM | POA: Diagnosis not present

## 2020-05-10 DIAGNOSIS — R208 Other disturbances of skin sensation: Secondary | ICD-10-CM | POA: Diagnosis not present

## 2020-05-10 DIAGNOSIS — M3489 Other systemic sclerosis: Secondary | ICD-10-CM | POA: Diagnosis not present

## 2020-05-10 DIAGNOSIS — R29898 Other symptoms and signs involving the musculoskeletal system: Secondary | ICD-10-CM | POA: Diagnosis not present

## 2020-05-10 DIAGNOSIS — M25642 Stiffness of left hand, not elsewhere classified: Secondary | ICD-10-CM | POA: Diagnosis not present

## 2020-05-10 DIAGNOSIS — M25641 Stiffness of right hand, not elsewhere classified: Secondary | ICD-10-CM | POA: Diagnosis not present

## 2020-05-17 DIAGNOSIS — M25641 Stiffness of right hand, not elsewhere classified: Secondary | ICD-10-CM | POA: Diagnosis not present

## 2020-05-17 DIAGNOSIS — R208 Other disturbances of skin sensation: Secondary | ICD-10-CM | POA: Diagnosis not present

## 2020-05-17 DIAGNOSIS — R29898 Other symptoms and signs involving the musculoskeletal system: Secondary | ICD-10-CM | POA: Diagnosis not present

## 2020-05-17 DIAGNOSIS — M79646 Pain in unspecified finger(s): Secondary | ICD-10-CM | POA: Diagnosis not present

## 2020-05-17 DIAGNOSIS — M3489 Other systemic sclerosis: Secondary | ICD-10-CM | POA: Diagnosis not present

## 2020-05-17 DIAGNOSIS — M25642 Stiffness of left hand, not elsewhere classified: Secondary | ICD-10-CM | POA: Diagnosis not present

## 2020-05-17 DIAGNOSIS — M79643 Pain in unspecified hand: Secondary | ICD-10-CM | POA: Diagnosis not present

## 2020-05-22 DIAGNOSIS — M25552 Pain in left hip: Secondary | ICD-10-CM | POA: Diagnosis not present

## 2020-05-22 DIAGNOSIS — R3982 Chronic bladder pain: Secondary | ICD-10-CM | POA: Diagnosis not present

## 2020-05-22 DIAGNOSIS — R102 Pelvic and perineal pain: Secondary | ICD-10-CM | POA: Diagnosis not present

## 2020-05-22 DIAGNOSIS — M62838 Other muscle spasm: Secondary | ICD-10-CM | POA: Diagnosis not present

## 2020-05-22 DIAGNOSIS — M6281 Muscle weakness (generalized): Secondary | ICD-10-CM | POA: Diagnosis not present

## 2020-05-24 DIAGNOSIS — R29898 Other symptoms and signs involving the musculoskeletal system: Secondary | ICD-10-CM | POA: Diagnosis not present

## 2020-05-24 DIAGNOSIS — R208 Other disturbances of skin sensation: Secondary | ICD-10-CM | POA: Diagnosis not present

## 2020-05-24 DIAGNOSIS — M25641 Stiffness of right hand, not elsewhere classified: Secondary | ICD-10-CM | POA: Diagnosis not present

## 2020-05-24 DIAGNOSIS — M25642 Stiffness of left hand, not elsewhere classified: Secondary | ICD-10-CM | POA: Diagnosis not present

## 2020-05-24 DIAGNOSIS — M3489 Other systemic sclerosis: Secondary | ICD-10-CM | POA: Diagnosis not present

## 2020-05-27 ENCOUNTER — Other Ambulatory Visit: Payer: Self-pay | Admitting: Internal Medicine

## 2020-05-29 ENCOUNTER — Telehealth: Payer: Self-pay | Admitting: Internal Medicine

## 2020-05-29 NOTE — Telephone Encounter (Signed)
She needs to schedule an office visit as indicated in her last 2 refills requests. Thx

## 2020-05-31 DIAGNOSIS — M25641 Stiffness of right hand, not elsewhere classified: Secondary | ICD-10-CM | POA: Diagnosis not present

## 2020-05-31 DIAGNOSIS — M25642 Stiffness of left hand, not elsewhere classified: Secondary | ICD-10-CM | POA: Diagnosis not present

## 2020-06-07 DIAGNOSIS — R208 Other disturbances of skin sensation: Secondary | ICD-10-CM | POA: Diagnosis not present

## 2020-06-07 DIAGNOSIS — R29898 Other symptoms and signs involving the musculoskeletal system: Secondary | ICD-10-CM | POA: Diagnosis not present

## 2020-06-07 DIAGNOSIS — M25641 Stiffness of right hand, not elsewhere classified: Secondary | ICD-10-CM | POA: Diagnosis not present

## 2020-06-07 DIAGNOSIS — M79643 Pain in unspecified hand: Secondary | ICD-10-CM | POA: Diagnosis not present

## 2020-06-07 DIAGNOSIS — M25642 Stiffness of left hand, not elsewhere classified: Secondary | ICD-10-CM | POA: Diagnosis not present

## 2020-06-07 DIAGNOSIS — M3489 Other systemic sclerosis: Secondary | ICD-10-CM | POA: Diagnosis not present

## 2020-06-07 DIAGNOSIS — M79646 Pain in unspecified finger(s): Secondary | ICD-10-CM | POA: Diagnosis not present

## 2020-06-14 DIAGNOSIS — M25641 Stiffness of right hand, not elsewhere classified: Secondary | ICD-10-CM | POA: Diagnosis not present

## 2020-06-14 DIAGNOSIS — M25642 Stiffness of left hand, not elsewhere classified: Secondary | ICD-10-CM | POA: Diagnosis not present

## 2020-06-19 DIAGNOSIS — M6289 Other specified disorders of muscle: Secondary | ICD-10-CM | POA: Diagnosis not present

## 2020-06-19 DIAGNOSIS — M349 Systemic sclerosis, unspecified: Secondary | ICD-10-CM | POA: Diagnosis not present

## 2020-06-19 DIAGNOSIS — M62838 Other muscle spasm: Secondary | ICD-10-CM | POA: Diagnosis not present

## 2020-06-19 DIAGNOSIS — M6281 Muscle weakness (generalized): Secondary | ICD-10-CM | POA: Diagnosis not present

## 2020-06-19 DIAGNOSIS — M25552 Pain in left hip: Secondary | ICD-10-CM | POA: Diagnosis not present

## 2020-06-19 DIAGNOSIS — R102 Pelvic and perineal pain: Secondary | ICD-10-CM | POA: Diagnosis not present

## 2020-06-19 DIAGNOSIS — R3982 Chronic bladder pain: Secondary | ICD-10-CM | POA: Diagnosis not present

## 2020-06-21 ENCOUNTER — Encounter: Payer: Self-pay | Admitting: Nurse Practitioner

## 2020-06-21 ENCOUNTER — Ambulatory Visit (INDEPENDENT_AMBULATORY_CARE_PROVIDER_SITE_OTHER): Payer: Medicare Other | Admitting: Nurse Practitioner

## 2020-06-21 ENCOUNTER — Ambulatory Visit: Payer: Medicare Other | Admitting: Nurse Practitioner

## 2020-06-21 VITALS — BP 124/70 | HR 88 | Ht 63.5 in | Wt 151.4 lb

## 2020-06-21 DIAGNOSIS — M3489 Other systemic sclerosis: Secondary | ICD-10-CM | POA: Diagnosis not present

## 2020-06-21 DIAGNOSIS — R208 Other disturbances of skin sensation: Secondary | ICD-10-CM | POA: Diagnosis not present

## 2020-06-21 DIAGNOSIS — M79643 Pain in unspecified hand: Secondary | ICD-10-CM | POA: Diagnosis not present

## 2020-06-21 DIAGNOSIS — K219 Gastro-esophageal reflux disease without esophagitis: Secondary | ICD-10-CM | POA: Diagnosis not present

## 2020-06-21 DIAGNOSIS — R194 Change in bowel habit: Secondary | ICD-10-CM | POA: Diagnosis not present

## 2020-06-21 DIAGNOSIS — M25642 Stiffness of left hand, not elsewhere classified: Secondary | ICD-10-CM | POA: Diagnosis not present

## 2020-06-21 DIAGNOSIS — R29898 Other symptoms and signs involving the musculoskeletal system: Secondary | ICD-10-CM | POA: Diagnosis not present

## 2020-06-21 DIAGNOSIS — M25641 Stiffness of right hand, not elsewhere classified: Secondary | ICD-10-CM | POA: Diagnosis not present

## 2020-06-21 DIAGNOSIS — M79646 Pain in unspecified finger(s): Secondary | ICD-10-CM | POA: Diagnosis not present

## 2020-06-21 MED ORDER — PANTOPRAZOLE SODIUM 40 MG PO TBEC: 40.0000 mg | DELAYED_RELEASE_TABLET | Freq: Every morning | ORAL | 4 refills | Status: DC

## 2020-06-21 NOTE — Patient Instructions (Addendum)
If you are age 71 or older, your body mass index should be between 23-30. Your Body mass index is 26.39 kg/m. If this is out of the aforementioned range listed, please consider follow up with your Primary Care Provider.  If you are age 69 or younger, your body mass index should be between 19-25. Your Body mass index is 26.39 kg/m. If this is out of the aformentioned range listed, please consider follow up with your Primary Care Provider.   We have sent in refills of your Pantoprazole 40 mg 1 tablet every morning  Start Citrucel daily as discussed.  Thank you for entrusting me with your care and choosing [facility].  Tye Savoy, NP

## 2020-06-21 NOTE — Progress Notes (Signed)
ASSESSMENT AND PLAN    # 69 yo female with GERD / hx of LA Grade C esophagitis. Follow up EGD Feb 2020 showed resolution of esophagitis on PPI therapy. Previously discussed stopping PPI with plans for resumption for recurrent symptoms. She recently started a probiotic supplement and not having GERD symptoms unless she consume trigger foods.  --She may eventually choose to stop PPI and just take Pepcid as needed. However if she gets If frequent symptoms then should resume daily PPI.   ---Refill pantoprazole 40 mg given  # Bowel changes. She was having bloating, gas and loose stools three months ago but after starting a supplement containing a prebiotic, probiotic and post biotic her symptoms have significantly improved. Her stools are no longer but they are soft, described as being like a pancake but not having multiple BMs a day.  --Can try Citrucel , a soluble fiber,  to try and absorb any excess fluid from stool. If no improvement after a couple of week then discontinue.  --If recurrence of loose stool then patient needs to call us for further work-up  # History of colon polyps.  Due for surveillance colonoscopy July 2020  HISTORY OF PRESENT ILLNESS     Primary Gastroenterologist :Zenovia Jarred, MD  Chief Complaint : refill on pantoprazole but also bloating and bowel changes  [name] is a 69 y.o. female with PMH / [place] significant for,  but not necessarily limited to: Scleroderma with systemic sclerosis, mild esophageal dysmotility, GERD with history of esophagitis, chronic gastritis without H. pylori or intestinal metaplasia in 2019,  colon polyps, hypothyroidism, breast cancer on tamoxifen  Patient is here for a refill on Pantoprazole but wants to discuss some other concerns. Regarding GERD,  she takes Pantoprazole 1-2 times a day depending on symptoms. Lately she has only been taking only one dose a day.   Three months ago Donyea developed loose stool, bloating and gas. She  hadn't made any dietary changes to account for bowel change. She thought maybe that the bowel changes were a part of scleroderma. She started "Bio Complete 3"  from [employer] which is a prebiotic, probiotic and postbiotic. This is her 3rd month of taking the supplement and her GI symptoms have significantly improved.  Stools are no longer loose but they are soft,  of 'pancake " consistency. Also,  after starting the supplement she has had much less reflux symptoms as long as she avoid triggers.    Past Medical History:  Diagnosis Date  . Anemia   . 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 Colon polyp   . 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 Depression   . Esophageal stricture   . GERD (gastroesophageal reflux disease)   . Hiatal hernia   . History of external beam radiation therapy 11-25-2011 to 01-08-2012   left breast 45 Gy at 1.8 per fraction x25 fractions, boost 16 Gy at 2 per fraction x8 fractions  . Hyperlipidemia   . Hypothyroidism   . Internal hemorrhoids   . Osteoarthritis   . Osteopenia   . Positive ANA (antinuclear antibody)   . Rash    arms and  legs caused by skin scleroderma  . Raynaud's syndrome   . 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 Ruthann Cancer at [facility]   systemic sclerosis , diffuse/   rheumotologist:  dr Manuella Ghazi at [facility]---  [place] 07-22-2017 (as of 07-25-2017 note not available to read)---  per pt has appt. w/ specialist at [facility]  . Skin thickening    hands, arms, legs  caused by scleroderma  . Systemic sclerosis ([facility])     Current Medications, Allergies, Past Surgical History, Family History and Social History were reviewed in  [employer] record.   Current Outpatient Medications  Medication Sig Dispense Refill  . ELDERBERRY PO Take by mouth 2 (two) times daily.    . furosemide (LASIX) 20 MG tablet Take 20 mg by mouth daily.     [name] gabapentin (NEURONTIN) 100 MG capsule Take 100-200 mg by mouth See admin instructions. Take 1101m by mouth in the morning and afternoon and 1088mat night  3  . mycophenolate (CELLCEPT) 250 MG capsule Take 6 capsules (1,500 mg total) by mouth 2 (two) times daily.    . pantoprazole (PROTONIX) 40 MG tablet TAKE 1 TABLET BY MOUTH TWICE DAILY BEFORE A MEAL. NEEDS OFFICE VISIT FOR ADDITIONAL REFILLS 180 tablet 0  . SYNTHROID 100 MCG tablet Take 100 mcg by mouth daily before breakfast.      No current facility-administered medications for this visit.    Review of Systems: No chest pain. No shortness of breath. No urinary complaints.   PHYSICAL EXAM :    Wt Readings from Last 3 Encounters:  06/21/20 151 lb 6 oz (68.7 kg)  02/15/20 148 lb 3.2 oz (67.2 kg)  01/27/20 149 lb (67.6 kg)    BP 124/70 (BP Location: Left Arm, Patient Position: Sitting, Cuff Size: Normal)   Pulse 88   Ht 5' 3.5" (1.613 m) Comment: height measured without shoes  Wt 151 lb 6 oz (68.7 kg)   BMI 26.39 kg/m  Constitutional:  Pleasant female in no acute distress. Psychiatric: Normal mood and affect. Behavior is normal. EENT: Pupils normal.  Conjunctivae are normal. No scleral icterus. Neck supple.  Cardiovascular: Normal rate, regular rhythm. No edema Pulmonary/chest: Effort normal and breath sounds normal. No wheezing, rales or rhonchi. Abdominal: Soft, nondistended, nontender. Bowel sounds active throughout. There are no masses palpable. No hepatomegaly. Neurological: Alert and oriented to person place and time. Skin: Skin is warm and dry. No rashes noted.  I spent 30 minutes total reviewing records, obtaining history, performing exam, counseling patient and documenting visit / findings.      PaTye SavoyNP  06/21/2020, 9:04 AM

## 2020-06-26 NOTE — Progress Notes (Signed)
Addendum: Reviewed and agree with assessment and management plan. It should be noted she is due for colonoscopy for surveillance in July 2024 (not 2020 as noted in this note). Ellean Firman, Lajuan Lines, MD

## 2020-06-27 DIAGNOSIS — M79643 Pain in unspecified hand: Secondary | ICD-10-CM | POA: Diagnosis not present

## 2020-06-27 DIAGNOSIS — M25641 Stiffness of right hand, not elsewhere classified: Secondary | ICD-10-CM | POA: Diagnosis not present

## 2020-06-27 DIAGNOSIS — R29898 Other symptoms and signs involving the musculoskeletal system: Secondary | ICD-10-CM | POA: Diagnosis not present

## 2020-06-27 DIAGNOSIS — M25642 Stiffness of left hand, not elsewhere classified: Secondary | ICD-10-CM | POA: Diagnosis not present

## 2020-06-27 DIAGNOSIS — R208 Other disturbances of skin sensation: Secondary | ICD-10-CM | POA: Diagnosis not present

## 2020-06-27 DIAGNOSIS — M3489 Other systemic sclerosis: Secondary | ICD-10-CM | POA: Diagnosis not present

## 2020-06-27 DIAGNOSIS — M79646 Pain in unspecified finger(s): Secondary | ICD-10-CM | POA: Diagnosis not present

## 2020-07-03 DIAGNOSIS — M62838 Other muscle spasm: Secondary | ICD-10-CM | POA: Diagnosis not present

## 2020-07-03 DIAGNOSIS — R102 Pelvic and perineal pain: Secondary | ICD-10-CM | POA: Diagnosis not present

## 2020-07-03 DIAGNOSIS — M6289 Other specified disorders of muscle: Secondary | ICD-10-CM | POA: Diagnosis not present

## 2020-07-03 DIAGNOSIS — M6281 Muscle weakness (generalized): Secondary | ICD-10-CM | POA: Diagnosis not present

## 2020-07-03 DIAGNOSIS — M25552 Pain in left hip: Secondary | ICD-10-CM | POA: Diagnosis not present

## 2020-07-19 DIAGNOSIS — M25642 Stiffness of left hand, not elsewhere classified: Secondary | ICD-10-CM | POA: Diagnosis not present

## 2020-07-19 DIAGNOSIS — R29898 Other symptoms and signs involving the musculoskeletal system: Secondary | ICD-10-CM | POA: Diagnosis not present

## 2020-07-19 DIAGNOSIS — M79643 Pain in unspecified hand: Secondary | ICD-10-CM | POA: Diagnosis not present

## 2020-07-19 DIAGNOSIS — M79646 Pain in unspecified finger(s): Secondary | ICD-10-CM | POA: Diagnosis not present

## 2020-07-19 DIAGNOSIS — M3489 Other systemic sclerosis: Secondary | ICD-10-CM | POA: Diagnosis not present

## 2020-07-19 DIAGNOSIS — R208 Other disturbances of skin sensation: Secondary | ICD-10-CM | POA: Diagnosis not present

## 2020-07-19 DIAGNOSIS — M25641 Stiffness of right hand, not elsewhere classified: Secondary | ICD-10-CM | POA: Diagnosis not present

## 2020-07-27 DIAGNOSIS — J069 Acute upper respiratory infection, unspecified: Secondary | ICD-10-CM | POA: Diagnosis not present

## 2020-07-27 DIAGNOSIS — R531 Weakness: Secondary | ICD-10-CM | POA: Diagnosis not present

## 2020-07-27 DIAGNOSIS — Z1152 Encounter for screening for COVID-19: Secondary | ICD-10-CM | POA: Diagnosis not present

## 2020-07-27 DIAGNOSIS — E871 Hypo-osmolality and hyponatremia: Secondary | ICD-10-CM | POA: Diagnosis not present

## 2020-07-27 DIAGNOSIS — Z20828 Contact with and (suspected) exposure to other viral communicable diseases: Secondary | ICD-10-CM | POA: Diagnosis not present

## 2020-07-28 DIAGNOSIS — E039 Hypothyroidism, unspecified: Secondary | ICD-10-CM | POA: Diagnosis not present

## 2020-08-01 DIAGNOSIS — E039 Hypothyroidism, unspecified: Secondary | ICD-10-CM | POA: Diagnosis not present

## 2020-08-10 DIAGNOSIS — E039 Hypothyroidism, unspecified: Secondary | ICD-10-CM | POA: Diagnosis not present

## 2020-08-10 DIAGNOSIS — M8580 Other specified disorders of bone density and structure, unspecified site: Secondary | ICD-10-CM | POA: Diagnosis not present

## 2020-08-21 DIAGNOSIS — R102 Pelvic and perineal pain: Secondary | ICD-10-CM | POA: Diagnosis not present

## 2020-08-21 DIAGNOSIS — M62838 Other muscle spasm: Secondary | ICD-10-CM | POA: Diagnosis not present

## 2020-08-21 DIAGNOSIS — M25552 Pain in left hip: Secondary | ICD-10-CM | POA: Diagnosis not present

## 2020-08-21 DIAGNOSIS — M6281 Muscle weakness (generalized): Secondary | ICD-10-CM | POA: Diagnosis not present

## 2020-08-21 DIAGNOSIS — R3982 Chronic bladder pain: Secondary | ICD-10-CM | POA: Diagnosis not present

## 2020-08-28 DIAGNOSIS — M6289 Other specified disorders of muscle: Secondary | ICD-10-CM | POA: Diagnosis not present

## 2020-08-28 DIAGNOSIS — R3982 Chronic bladder pain: Secondary | ICD-10-CM | POA: Diagnosis not present

## 2020-08-28 DIAGNOSIS — M349 Systemic sclerosis, unspecified: Secondary | ICD-10-CM | POA: Diagnosis not present

## 2020-08-28 DIAGNOSIS — M6281 Muscle weakness (generalized): Secondary | ICD-10-CM | POA: Diagnosis not present

## 2020-08-28 DIAGNOSIS — M25552 Pain in left hip: Secondary | ICD-10-CM | POA: Diagnosis not present

## 2020-08-28 DIAGNOSIS — R102 Pelvic and perineal pain: Secondary | ICD-10-CM | POA: Diagnosis not present

## 2020-08-28 DIAGNOSIS — M62838 Other muscle spasm: Secondary | ICD-10-CM | POA: Diagnosis not present

## 2020-08-30 DIAGNOSIS — M25642 Stiffness of left hand, not elsewhere classified: Secondary | ICD-10-CM | POA: Diagnosis not present

## 2020-08-30 DIAGNOSIS — R29898 Other symptoms and signs involving the musculoskeletal system: Secondary | ICD-10-CM | POA: Diagnosis not present

## 2020-08-30 DIAGNOSIS — M3489 Other systemic sclerosis: Secondary | ICD-10-CM | POA: Diagnosis not present

## 2020-08-30 DIAGNOSIS — R208 Other disturbances of skin sensation: Secondary | ICD-10-CM | POA: Diagnosis not present

## 2020-08-30 DIAGNOSIS — M79643 Pain in unspecified hand: Secondary | ICD-10-CM | POA: Diagnosis not present

## 2020-08-30 DIAGNOSIS — M79646 Pain in unspecified finger(s): Secondary | ICD-10-CM | POA: Diagnosis not present

## 2020-08-30 DIAGNOSIS — M25641 Stiffness of right hand, not elsewhere classified: Secondary | ICD-10-CM | POA: Diagnosis not present

## 2020-09-04 DIAGNOSIS — M62838 Other muscle spasm: Secondary | ICD-10-CM | POA: Diagnosis not present

## 2020-09-04 DIAGNOSIS — R102 Pelvic and perineal pain: Secondary | ICD-10-CM | POA: Diagnosis not present

## 2020-09-04 DIAGNOSIS — M6289 Other specified disorders of muscle: Secondary | ICD-10-CM | POA: Diagnosis not present

## 2020-09-04 DIAGNOSIS — M6281 Muscle weakness (generalized): Secondary | ICD-10-CM | POA: Diagnosis not present

## 2020-09-13 DIAGNOSIS — R208 Other disturbances of skin sensation: Secondary | ICD-10-CM | POA: Diagnosis not present

## 2020-09-13 DIAGNOSIS — R29898 Other symptoms and signs involving the musculoskeletal system: Secondary | ICD-10-CM | POA: Diagnosis not present

## 2020-09-13 DIAGNOSIS — M25642 Stiffness of left hand, not elsewhere classified: Secondary | ICD-10-CM | POA: Diagnosis not present

## 2020-09-13 DIAGNOSIS — M79643 Pain in unspecified hand: Secondary | ICD-10-CM | POA: Diagnosis not present

## 2020-09-13 DIAGNOSIS — M3489 Other systemic sclerosis: Secondary | ICD-10-CM | POA: Diagnosis not present

## 2020-09-13 DIAGNOSIS — M79646 Pain in unspecified finger(s): Secondary | ICD-10-CM | POA: Diagnosis not present

## 2020-09-13 DIAGNOSIS — M25641 Stiffness of right hand, not elsewhere classified: Secondary | ICD-10-CM | POA: Diagnosis not present

## 2020-09-18 DIAGNOSIS — M62838 Other muscle spasm: Secondary | ICD-10-CM | POA: Diagnosis not present

## 2020-09-18 DIAGNOSIS — N3021 Other chronic cystitis with hematuria: Secondary | ICD-10-CM | POA: Diagnosis not present

## 2020-09-18 DIAGNOSIS — R102 Pelvic and perineal pain: Secondary | ICD-10-CM | POA: Diagnosis not present

## 2020-09-18 DIAGNOSIS — M6289 Other specified disorders of muscle: Secondary | ICD-10-CM | POA: Diagnosis not present

## 2020-09-18 DIAGNOSIS — M25552 Pain in left hip: Secondary | ICD-10-CM | POA: Diagnosis not present

## 2020-09-18 DIAGNOSIS — M6281 Muscle weakness (generalized): Secondary | ICD-10-CM | POA: Diagnosis not present

## 2020-09-18 DIAGNOSIS — R3982 Chronic bladder pain: Secondary | ICD-10-CM | POA: Diagnosis not present

## 2020-09-26 DIAGNOSIS — M62838 Other muscle spasm: Secondary | ICD-10-CM | POA: Diagnosis not present

## 2020-09-26 DIAGNOSIS — M6281 Muscle weakness (generalized): Secondary | ICD-10-CM | POA: Diagnosis not present

## 2020-09-26 DIAGNOSIS — R102 Pelvic and perineal pain: Secondary | ICD-10-CM | POA: Diagnosis not present

## 2020-09-26 DIAGNOSIS — M25552 Pain in left hip: Secondary | ICD-10-CM | POA: Diagnosis not present

## 2020-09-26 DIAGNOSIS — M6289 Other specified disorders of muscle: Secondary | ICD-10-CM | POA: Diagnosis not present

## 2020-09-26 DIAGNOSIS — R3982 Chronic bladder pain: Secondary | ICD-10-CM | POA: Diagnosis not present

## 2020-09-27 DIAGNOSIS — M25642 Stiffness of left hand, not elsewhere classified: Secondary | ICD-10-CM | POA: Diagnosis not present

## 2020-09-27 DIAGNOSIS — M25641 Stiffness of right hand, not elsewhere classified: Secondary | ICD-10-CM | POA: Diagnosis not present

## 2020-09-27 DIAGNOSIS — R208 Other disturbances of skin sensation: Secondary | ICD-10-CM | POA: Diagnosis not present

## 2020-09-27 DIAGNOSIS — M79646 Pain in unspecified finger(s): Secondary | ICD-10-CM | POA: Diagnosis not present

## 2020-09-27 DIAGNOSIS — R29898 Other symptoms and signs involving the musculoskeletal system: Secondary | ICD-10-CM | POA: Diagnosis not present

## 2020-09-27 DIAGNOSIS — M79643 Pain in unspecified hand: Secondary | ICD-10-CM | POA: Diagnosis not present

## 2020-09-27 DIAGNOSIS — M3489 Other systemic sclerosis: Secondary | ICD-10-CM | POA: Diagnosis not present

## 2020-10-09 DIAGNOSIS — R102 Pelvic and perineal pain: Secondary | ICD-10-CM | POA: Diagnosis not present

## 2020-10-09 DIAGNOSIS — M25552 Pain in left hip: Secondary | ICD-10-CM | POA: Diagnosis not present

## 2020-10-09 DIAGNOSIS — M6281 Muscle weakness (generalized): Secondary | ICD-10-CM | POA: Diagnosis not present

## 2020-10-09 DIAGNOSIS — M62838 Other muscle spasm: Secondary | ICD-10-CM | POA: Diagnosis not present

## 2020-10-10 DIAGNOSIS — H524 Presbyopia: Secondary | ICD-10-CM | POA: Diagnosis not present

## 2020-10-10 DIAGNOSIS — H25013 Cortical age-related cataract, bilateral: Secondary | ICD-10-CM | POA: Diagnosis not present

## 2020-10-10 DIAGNOSIS — H52223 Regular astigmatism, bilateral: Secondary | ICD-10-CM | POA: Diagnosis not present

## 2020-10-10 DIAGNOSIS — H5203 Hypermetropia, bilateral: Secondary | ICD-10-CM | POA: Diagnosis not present

## 2020-10-12 DIAGNOSIS — D2262 Melanocytic nevi of left upper limb, including shoulder: Secondary | ICD-10-CM | POA: Diagnosis not present

## 2020-10-12 DIAGNOSIS — L718 Other rosacea: Secondary | ICD-10-CM | POA: Diagnosis not present

## 2020-10-12 DIAGNOSIS — L820 Inflamed seborrheic keratosis: Secondary | ICD-10-CM | POA: Diagnosis not present

## 2020-10-12 DIAGNOSIS — Z85828 Personal history of other malignant neoplasm of skin: Secondary | ICD-10-CM | POA: Diagnosis not present

## 2020-10-12 DIAGNOSIS — D2271 Melanocytic nevi of right lower limb, including hip: Secondary | ICD-10-CM | POA: Diagnosis not present

## 2020-10-12 DIAGNOSIS — L821 Other seborrheic keratosis: Secondary | ICD-10-CM | POA: Diagnosis not present

## 2020-10-12 DIAGNOSIS — L814 Other melanin hyperpigmentation: Secondary | ICD-10-CM | POA: Diagnosis not present

## 2020-10-23 DIAGNOSIS — M6281 Muscle weakness (generalized): Secondary | ICD-10-CM | POA: Diagnosis not present

## 2020-10-23 DIAGNOSIS — R102 Pelvic and perineal pain: Secondary | ICD-10-CM | POA: Diagnosis not present

## 2020-10-23 DIAGNOSIS — M6289 Other specified disorders of muscle: Secondary | ICD-10-CM | POA: Diagnosis not present

## 2020-10-23 DIAGNOSIS — M25552 Pain in left hip: Secondary | ICD-10-CM | POA: Diagnosis not present

## 2020-10-23 DIAGNOSIS — M62838 Other muscle spasm: Secondary | ICD-10-CM | POA: Diagnosis not present

## 2020-10-24 DIAGNOSIS — E039 Hypothyroidism, unspecified: Secondary | ICD-10-CM | POA: Diagnosis not present

## 2020-10-25 DIAGNOSIS — M25641 Stiffness of right hand, not elsewhere classified: Secondary | ICD-10-CM | POA: Diagnosis not present

## 2020-10-25 DIAGNOSIS — R208 Other disturbances of skin sensation: Secondary | ICD-10-CM | POA: Diagnosis not present

## 2020-10-25 DIAGNOSIS — R29898 Other symptoms and signs involving the musculoskeletal system: Secondary | ICD-10-CM | POA: Diagnosis not present

## 2020-10-25 DIAGNOSIS — M3489 Other systemic sclerosis: Secondary | ICD-10-CM | POA: Diagnosis not present

## 2020-10-25 DIAGNOSIS — M25642 Stiffness of left hand, not elsewhere classified: Secondary | ICD-10-CM | POA: Diagnosis not present

## 2020-10-25 DIAGNOSIS — M79646 Pain in unspecified finger(s): Secondary | ICD-10-CM | POA: Diagnosis not present

## 2020-10-25 DIAGNOSIS — M79643 Pain in unspecified hand: Secondary | ICD-10-CM | POA: Diagnosis not present

## 2020-10-31 DIAGNOSIS — E785 Hyperlipidemia, unspecified: Secondary | ICD-10-CM | POA: Diagnosis not present

## 2020-10-31 DIAGNOSIS — Z1339 Encounter for screening examination for other mental health and behavioral disorders: Secondary | ICD-10-CM | POA: Diagnosis not present

## 2020-10-31 DIAGNOSIS — Z79899 Other long term (current) drug therapy: Secondary | ICD-10-CM | POA: Diagnosis not present

## 2020-10-31 DIAGNOSIS — D849 Immunodeficiency, unspecified: Secondary | ICD-10-CM | POA: Diagnosis not present

## 2020-10-31 DIAGNOSIS — N1831 Chronic kidney disease, stage 3a: Secondary | ICD-10-CM | POA: Diagnosis not present

## 2020-10-31 DIAGNOSIS — R82998 Other abnormal findings in urine: Secondary | ICD-10-CM | POA: Diagnosis not present

## 2020-10-31 DIAGNOSIS — Z1212 Encounter for screening for malignant neoplasm of rectum: Secondary | ICD-10-CM | POA: Diagnosis not present

## 2020-10-31 DIAGNOSIS — E039 Hypothyroidism, unspecified: Secondary | ICD-10-CM | POA: Diagnosis not present

## 2020-10-31 DIAGNOSIS — M349 Systemic sclerosis, unspecified: Secondary | ICD-10-CM | POA: Diagnosis not present

## 2020-10-31 DIAGNOSIS — G629 Polyneuropathy, unspecified: Secondary | ICD-10-CM | POA: Diagnosis not present

## 2020-10-31 DIAGNOSIS — J849 Interstitial pulmonary disease, unspecified: Secondary | ICD-10-CM | POA: Diagnosis not present

## 2020-10-31 DIAGNOSIS — R319 Hematuria, unspecified: Secondary | ICD-10-CM | POA: Diagnosis not present

## 2020-10-31 DIAGNOSIS — M8580 Other specified disorders of bone density and structure, unspecified site: Secondary | ICD-10-CM | POA: Diagnosis not present

## 2020-10-31 DIAGNOSIS — Z0000 Encounter for general adult medical examination without abnormal findings: Secondary | ICD-10-CM | POA: Diagnosis not present

## 2020-10-31 DIAGNOSIS — I872 Venous insufficiency (chronic) (peripheral): Secondary | ICD-10-CM | POA: Diagnosis not present

## 2020-10-31 DIAGNOSIS — Z1331 Encounter for screening for depression: Secondary | ICD-10-CM | POA: Diagnosis not present

## 2020-11-07 DIAGNOSIS — I7300 Raynaud's syndrome without gangrene: Secondary | ICD-10-CM | POA: Diagnosis not present

## 2020-11-07 DIAGNOSIS — R8290 Unspecified abnormal findings in urine: Secondary | ICD-10-CM | POA: Diagnosis not present

## 2020-11-07 DIAGNOSIS — Z79899 Other long term (current) drug therapy: Secondary | ICD-10-CM | POA: Diagnosis not present

## 2020-11-07 DIAGNOSIS — M349 Systemic sclerosis, unspecified: Secondary | ICD-10-CM | POA: Diagnosis not present

## 2020-11-13 DIAGNOSIS — M25552 Pain in left hip: Secondary | ICD-10-CM | POA: Diagnosis not present

## 2020-11-13 DIAGNOSIS — M6289 Other specified disorders of muscle: Secondary | ICD-10-CM | POA: Diagnosis not present

## 2020-11-13 DIAGNOSIS — M62838 Other muscle spasm: Secondary | ICD-10-CM | POA: Diagnosis not present

## 2020-11-13 DIAGNOSIS — R3982 Chronic bladder pain: Secondary | ICD-10-CM | POA: Diagnosis not present

## 2020-11-13 DIAGNOSIS — M349 Systemic sclerosis, unspecified: Secondary | ICD-10-CM | POA: Diagnosis not present

## 2020-11-13 DIAGNOSIS — M6281 Muscle weakness (generalized): Secondary | ICD-10-CM | POA: Diagnosis not present

## 2020-11-13 DIAGNOSIS — R102 Pelvic and perineal pain: Secondary | ICD-10-CM | POA: Diagnosis not present

## 2020-11-20 DIAGNOSIS — M25642 Stiffness of left hand, not elsewhere classified: Secondary | ICD-10-CM | POA: Diagnosis not present

## 2020-11-20 DIAGNOSIS — M79643 Pain in unspecified hand: Secondary | ICD-10-CM | POA: Diagnosis not present

## 2020-11-20 DIAGNOSIS — R29898 Other symptoms and signs involving the musculoskeletal system: Secondary | ICD-10-CM | POA: Diagnosis not present

## 2020-11-20 DIAGNOSIS — M25641 Stiffness of right hand, not elsewhere classified: Secondary | ICD-10-CM | POA: Diagnosis not present

## 2020-11-20 DIAGNOSIS — M3489 Other systemic sclerosis: Secondary | ICD-10-CM | POA: Diagnosis not present

## 2020-11-20 DIAGNOSIS — M79646 Pain in unspecified finger(s): Secondary | ICD-10-CM | POA: Diagnosis not present

## 2020-11-20 DIAGNOSIS — R208 Other disturbances of skin sensation: Secondary | ICD-10-CM | POA: Diagnosis not present

## 2020-11-27 DIAGNOSIS — R102 Pelvic and perineal pain: Secondary | ICD-10-CM | POA: Diagnosis not present

## 2020-11-27 DIAGNOSIS — M6281 Muscle weakness (generalized): Secondary | ICD-10-CM | POA: Diagnosis not present

## 2020-11-27 DIAGNOSIS — R3982 Chronic bladder pain: Secondary | ICD-10-CM | POA: Diagnosis not present

## 2020-11-27 DIAGNOSIS — M25552 Pain in left hip: Secondary | ICD-10-CM | POA: Diagnosis not present

## 2020-11-27 DIAGNOSIS — M62838 Other muscle spasm: Secondary | ICD-10-CM | POA: Diagnosis not present

## 2020-11-27 DIAGNOSIS — M6289 Other specified disorders of muscle: Secondary | ICD-10-CM | POA: Diagnosis not present

## 2020-12-04 DIAGNOSIS — R0602 Shortness of breath: Secondary | ICD-10-CM | POA: Diagnosis not present

## 2020-12-04 DIAGNOSIS — M349 Systemic sclerosis, unspecified: Secondary | ICD-10-CM | POA: Diagnosis not present

## 2020-12-12 DIAGNOSIS — M6289 Other specified disorders of muscle: Secondary | ICD-10-CM | POA: Diagnosis not present

## 2020-12-12 DIAGNOSIS — M349 Systemic sclerosis, unspecified: Secondary | ICD-10-CM | POA: Diagnosis not present

## 2020-12-12 DIAGNOSIS — M6281 Muscle weakness (generalized): Secondary | ICD-10-CM | POA: Diagnosis not present

## 2020-12-12 DIAGNOSIS — M25552 Pain in left hip: Secondary | ICD-10-CM | POA: Diagnosis not present

## 2020-12-12 DIAGNOSIS — R102 Pelvic and perineal pain: Secondary | ICD-10-CM | POA: Diagnosis not present

## 2020-12-12 DIAGNOSIS — M62838 Other muscle spasm: Secondary | ICD-10-CM | POA: Diagnosis not present

## 2020-12-20 DIAGNOSIS — M79643 Pain in unspecified hand: Secondary | ICD-10-CM | POA: Diagnosis not present

## 2020-12-20 DIAGNOSIS — M79646 Pain in unspecified finger(s): Secondary | ICD-10-CM | POA: Diagnosis not present

## 2020-12-20 DIAGNOSIS — R29898 Other symptoms and signs involving the musculoskeletal system: Secondary | ICD-10-CM | POA: Diagnosis not present

## 2020-12-20 DIAGNOSIS — M25641 Stiffness of right hand, not elsewhere classified: Secondary | ICD-10-CM | POA: Diagnosis not present

## 2020-12-20 DIAGNOSIS — M25642 Stiffness of left hand, not elsewhere classified: Secondary | ICD-10-CM | POA: Diagnosis not present

## 2020-12-20 DIAGNOSIS — R208 Other disturbances of skin sensation: Secondary | ICD-10-CM | POA: Diagnosis not present

## 2020-12-20 DIAGNOSIS — M3489 Other systemic sclerosis: Secondary | ICD-10-CM | POA: Diagnosis not present

## 2020-12-25 DIAGNOSIS — M25552 Pain in left hip: Secondary | ICD-10-CM | POA: Diagnosis not present

## 2020-12-25 DIAGNOSIS — R3982 Chronic bladder pain: Secondary | ICD-10-CM | POA: Diagnosis not present

## 2020-12-25 DIAGNOSIS — R102 Pelvic and perineal pain: Secondary | ICD-10-CM | POA: Diagnosis not present

## 2020-12-25 DIAGNOSIS — M62838 Other muscle spasm: Secondary | ICD-10-CM | POA: Diagnosis not present

## 2020-12-25 DIAGNOSIS — M6281 Muscle weakness (generalized): Secondary | ICD-10-CM | POA: Diagnosis not present

## 2021-01-01 DIAGNOSIS — M25641 Stiffness of right hand, not elsewhere classified: Secondary | ICD-10-CM | POA: Diagnosis not present

## 2021-01-01 DIAGNOSIS — R208 Other disturbances of skin sensation: Secondary | ICD-10-CM | POA: Diagnosis not present

## 2021-01-01 DIAGNOSIS — M3489 Other systemic sclerosis: Secondary | ICD-10-CM | POA: Diagnosis not present

## 2021-01-01 DIAGNOSIS — M79643 Pain in unspecified hand: Secondary | ICD-10-CM | POA: Diagnosis not present

## 2021-01-01 DIAGNOSIS — R29898 Other symptoms and signs involving the musculoskeletal system: Secondary | ICD-10-CM | POA: Diagnosis not present

## 2021-01-01 DIAGNOSIS — M79646 Pain in unspecified finger(s): Secondary | ICD-10-CM | POA: Diagnosis not present

## 2021-01-01 DIAGNOSIS — M25642 Stiffness of left hand, not elsewhere classified: Secondary | ICD-10-CM | POA: Diagnosis not present

## 2021-01-10 DIAGNOSIS — M6289 Other specified disorders of muscle: Secondary | ICD-10-CM | POA: Diagnosis not present

## 2021-01-10 DIAGNOSIS — M62838 Other muscle spasm: Secondary | ICD-10-CM | POA: Diagnosis not present

## 2021-01-10 DIAGNOSIS — M6281 Muscle weakness (generalized): Secondary | ICD-10-CM | POA: Diagnosis not present

## 2021-01-10 DIAGNOSIS — M25552 Pain in left hip: Secondary | ICD-10-CM | POA: Diagnosis not present

## 2021-01-10 DIAGNOSIS — M349 Systemic sclerosis, unspecified: Secondary | ICD-10-CM | POA: Diagnosis not present

## 2021-01-10 DIAGNOSIS — N3021 Other chronic cystitis with hematuria: Secondary | ICD-10-CM | POA: Diagnosis not present

## 2021-01-29 DIAGNOSIS — M62838 Other muscle spasm: Secondary | ICD-10-CM | POA: Diagnosis not present

## 2021-01-29 DIAGNOSIS — M349 Systemic sclerosis, unspecified: Secondary | ICD-10-CM | POA: Diagnosis not present

## 2021-01-29 DIAGNOSIS — R102 Pelvic and perineal pain: Secondary | ICD-10-CM | POA: Diagnosis not present

## 2021-01-29 DIAGNOSIS — M6281 Muscle weakness (generalized): Secondary | ICD-10-CM | POA: Diagnosis not present

## 2021-01-29 DIAGNOSIS — M25552 Pain in left hip: Secondary | ICD-10-CM | POA: Diagnosis not present

## 2021-01-29 DIAGNOSIS — N3021 Other chronic cystitis with hematuria: Secondary | ICD-10-CM | POA: Diagnosis not present

## 2021-01-29 DIAGNOSIS — M6289 Other specified disorders of muscle: Secondary | ICD-10-CM | POA: Diagnosis not present

## 2021-01-29 DIAGNOSIS — R3982 Chronic bladder pain: Secondary | ICD-10-CM | POA: Diagnosis not present

## 2021-01-31 ENCOUNTER — Other Ambulatory Visit: Payer: Self-pay

## 2021-01-31 ENCOUNTER — Ambulatory Visit
Admission: RE | Admit: 2021-01-31 | Discharge: 2021-01-31 | Disposition: A | Discharge status: Home / Self Care | Payer: Medicare Other | Source: Ambulatory Visit | Attending: Oncology | Admitting: Oncology

## 2021-01-31 DIAGNOSIS — R922 Inconclusive mammogram: Secondary | ICD-10-CM

## 2021-01-31 DIAGNOSIS — M8580 Other specified disorders of bone density and structure, unspecified site: Secondary | ICD-10-CM

## 2021-01-31 DIAGNOSIS — C50412 Malignant neoplasm of upper-outer quadrant of left female breast: Secondary | ICD-10-CM

## 2021-01-31 DIAGNOSIS — R928 Other abnormal and inconclusive findings on diagnostic imaging of breast: Secondary | ICD-10-CM | POA: Diagnosis not present

## 2021-01-31 MED ORDER — GADOBUTROL 1 MMOL/ML IV SOLN
6.0000 mL | Freq: Once | INTRAVENOUS | Status: AC | PRN
  Administered 2021-01-31: 6 mL via INTRAVENOUS

## 2021-02-01 ENCOUNTER — Telehealth: Payer: Self-pay

## 2021-02-01 NOTE — Telephone Encounter (Signed)
Attempted to call pt to give results. LVM for pt to return call to [facility].

## 2021-02-01 NOTE — Telephone Encounter (Signed)
-----   Message from Gardenia Phlegm, NP sent at 01/31/2021  1:32 PM EDT ----- Pleaes review normal breast MRI with patient.  NO cancer! :) ----- Message ----- From: Interface, Rad Results In Sent: 01/31/2021   1:08 PM EDT To: Chauncey Cruel, MD

## 2021-02-02 ENCOUNTER — Other Ambulatory Visit (HOSPITAL_COMMUNITY)
Admission: RE | Admit: 2021-02-02 | Discharge: 2021-02-02 | Disposition: A | Discharge status: Home / Self Care | Payer: Medicare Other | Source: Ambulatory Visit | Attending: Obstetrics & Gynecology | Admitting: Obstetrics & Gynecology

## 2021-02-02 ENCOUNTER — Encounter (HOSPITAL_BASED_OUTPATIENT_CLINIC_OR_DEPARTMENT_OTHER): Payer: Self-pay | Admitting: Obstetrics & Gynecology

## 2021-02-02 ENCOUNTER — Ambulatory Visit (INDEPENDENT_AMBULATORY_CARE_PROVIDER_SITE_OTHER): Payer: Medicare Other | Admitting: Obstetrics & Gynecology

## 2021-02-02 ENCOUNTER — Other Ambulatory Visit: Payer: Self-pay

## 2021-02-02 VITALS — HR 79 | Ht 63.5 in | Wt 147.6 lb

## 2021-02-02 DIAGNOSIS — Z01411 Encounter for gynecological examination (general) (routine) with abnormal findings: Secondary | ICD-10-CM

## 2021-02-02 DIAGNOSIS — Z124 Encounter for screening for malignant neoplasm of cervix: Secondary | ICD-10-CM | POA: Diagnosis not present

## 2021-02-02 DIAGNOSIS — Z860100 Personal history of colon polyps, unspecified: Secondary | ICD-10-CM

## 2021-02-02 DIAGNOSIS — Z780 Asymptomatic menopausal state: Secondary | ICD-10-CM | POA: Diagnosis not present

## 2021-02-02 DIAGNOSIS — Z87440 Personal history of urinary (tract) infections: Secondary | ICD-10-CM | POA: Diagnosis not present

## 2021-02-02 DIAGNOSIS — N3090 Cystitis, unspecified without hematuria: Secondary | ICD-10-CM | POA: Diagnosis not present

## 2021-02-02 LAB — POCT URINALYSIS DIPSTICK
Appearance: NORMAL
Bilirubin, UA: NEGATIVE
Glucose, UA: NEGATIVE
Ketones, UA: NEGATIVE
Nitrite, UA: NEGATIVE
Protein, UA: NEGATIVE
Spec Grav, UA: 1.015 (ref 1.010–1.025)
Urobilinogen, UA: 0.2 E.U./dL
pH, UA: 5 (ref 5.0–8.0)

## 2021-02-02 MED ORDER — NITROFURANTOIN MONOHYD MACRO 100 MG PO CAPS: 100.0000 mg | ORAL_CAPSULE | Freq: Two times a day (BID) | ORAL | 0 refills | Status: DC

## 2021-02-02 NOTE — Progress Notes (Signed)
70 y.o. G38P1010 Married White or Caucasian female here for breast and pelvic exam.  Reports having a klebsiella UTI in April.  She just doesn't feel like it has fully cleared.  Reviewed records in [place].  Notes from [place], urine and urine culture and serology reviewed.  Creatinine clearance is 52.  No dosage adjustment for macrobid needed.  Will prescribe for her as is Friday and in case symptoms worsen over the weekend.  States she is really getting tired of the all the doctor appointments.  Doesn't think her hands will ever get any better.  Other area of skin tightness have improved.  Continues to be followed at [place].  Notes reviewed from [place] as well.  Rheumatologist is at [facility].  Pulmonologist is at [facility].  Will see Dr. [name] one more time this year.  Wonders if can be released from oncology and let me just follow.  Advised I can manage her breast MRIs.  Does not do MMGs any more due to breast changes related to x ray.  Denies vaginal bleeding.  No LMP recorded. Patient is postmenopausal.          Sexually active: Yes.    H/O STD:  no  Health Maintenance: PCP:  Dr. Brigitte Pulse.  Last wellness appt was 11/2019.  Did blood work at that appt:  yes Vaccines are up to date:  pt does not receive vaccines Colonoscopy:  01/29/18, follow up 5 years MMG:  MRI 01/31/21, cannot do MMG due to radiation BMD:  04/27/20 osteopenia Last pap smear:  01/04/19 neg.   H/o abnormal pap smear:  no   reports that she has never smoked. She has never used smokeless tobacco. She reports current alcohol use. She reports that she does not use drugs.  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 Ruthann Cancer at [facility]   systemic sclerosis , diffuse/   rheumotologist:  dr Manuella Ghazi at [facility]---  lov 07-22-2017 (as of 07-25-2017 note not available to read)---  per pt has appt. w/ specialist at [facility]   Skin thickening    hands, arms, legs  caused by scleroderma   Systemic sclerosis ([place])    UTI (urinary tract infection)     Past Surgical History:  Procedure Laterality Date   BIOPSY  01/29/2018   Procedure: BIOPSY;  Surgeon: Jerene Bears, MD;  Location: [facility];  Service: Gastroenterology;;  BUNIONECTOMY  2000   Left   COLONOSCOPY WITH PROPOFOL N/A 01/29/2018   Procedure: COLONOSCOPY WITH PROPOFOL;  Surgeon: Jerene Bears, MD;  Location: [facility];  Service: Gastroenterology;  Laterality: N/A;   DILATATION & CURETTAGE/HYSTEROSCOPY WITH MYOSURE N/A 08/04/2017   Procedure: DILATATION & CURETTAGE/HYSTEROSCOPY WITH MYOSURE;  Surgeon: Megan Salon, MD;  Location: [facility];  Service: Gynecology;  Laterality: N/A;   ECTOPIC PREGNANCY SURGERY  1986-88   s/p unilateral salpingectomy   ESOPHAGOGASTRODUODENOSCOPY (EGD) WITH PROPOFOL N/A 01/29/2018   Procedure: ESOPHAGOGASTRODUODENOSCOPY (EGD) WITH PROPOFOL;  Surgeon: Jerene Bears, MD;  [facility]: [facility];  Service: Gastroenterology;  Laterality: N/A;   PARTIAL MASTECTOMY WITH  AXILLARY SENTINEL LYMPH NODE BIOPSY Left 11-04-2011   dr Margot Chimes  [facility]   TRANSTHORACIC ECHOCARDIOGRAM  06-04-2017    [facility]   moderate LVH, ef>55%/  trivial AR and TR/ mild MR and PR/  trivial pericardial effusion   WISDOM TOOTH EXTRACTION      Current Outpatient Medications  Medication Sig Dispense Refill   ELDERBERRY PO Take by mouth 2 (two) times daily.     furosemide (LASIX) 20 MG tablet Take 20 mg by mouth daily.     mycophenolate (CELLCEPT) 250 MG capsule Take 2,500 mg by mouth daily.     nitrofurantoin, macrocrystal-monohydrate, (MACROBID) 100 MG capsule Take 1 capsule (100 mg total) by mouth 2 (two) times daily. 10 capsule 0   OVER THE COUNTER MEDICATION Relief Factor     SYNTHROID 100 MCG tablet Take 88 mcg by mouth daily before breakfast.     No current facility-administered medications for this visit.    Family History  Problem Relation Age of Onset   Heart disease Mother    Heart failure Mother    Heart disease Father    Ovarian cancer Maternal Aunt    Heart disease Paternal Uncle        multiple   Autoimmune disease Sister    Arrhythmia Sister    Anemia Sister    CAD Brother    Colon cancer Neg Hx    Stomach cancer Neg Hx    Esophageal cancer Neg Hx    Rectal cancer Neg Hx     Review of Systems  Constitutional: Negative.   Gastrointestinal: Negative.   Genitourinary:        Dyspareunia   Exam:   Pulse 79   Ht 5' 3.5" (1.613 m)   Wt 147 lb 9.6 oz (67 kg)   BMI 25.74 kg/m   Height: 5' 3.5" (161.3 cm)  General appearance: alert, cooperative and appears stated age Breasts: radiation changes and firmness around left breast, stable, no LAD and no discrete mass; right breast without masses, skin changes, LAD Abdomen: soft, non-tender; bowel sounds normal; no masses,  no organomegaly Lymph nodes: Cervical, supraclavicular, and axillary nodes normal.  No abnormal inguinal nodes palpated Neurologic: Grossly normal  Pelvic: External genitalia:  no lesions               Urethra:  normal appearing urethra with no masses, tenderness or lesions              Bartholins and Skenes: normal                 Vagina: normal appearing vagina with atrophic changes and no discharge, no lesions              Cervix: no lesions  Pap taken: Yes.   Bimanual Exam:  Uterus:  normal size, contour, position, consistency, mobility, non-tender              Adnexa: normal adnexa               Rectovaginal: Confirms               Anus:  normal sphincter tone, no lesions  Chaperone, Octaviano Batty, CMA, was present for exam.  Assessment/Plan: 1. Encounter for gynecological examination with abnormal finding - pap and HR HPV obtained today - MMGs not done.  Breast MRI 01/31/2021. - colonoscopy 7/19.  Follow up 5 years - screening lab work done with Dr. Brigitte Pulse, PCP - BMD 04/27/2020 - vaccines reviewed.  Pt is now getting only what she must have  2. Postmenopausal - no HRT  3. History of UTI and recent cystitis - POCT urinalysis dipstick - Urine Culture - rx for macrobid to pharmacy if she needs over the weekend  4. History of colonic polyps - follow up due 2024  5.  Scleroderma  6.  History of breast cancer stage IIA grade 2, 08/2011

## 2021-02-05 DIAGNOSIS — Z87440 Personal history of urinary (tract) infections: Secondary | ICD-10-CM | POA: Insufficient documentation

## 2021-02-05 LAB — URINE CULTURE

## 2021-02-05 LAB — CYTOLOGY - PAP: Diagnosis: NEGATIVE

## 2021-02-07 DIAGNOSIS — M79646 Pain in unspecified finger(s): Secondary | ICD-10-CM | POA: Diagnosis not present

## 2021-02-07 DIAGNOSIS — M79643 Pain in unspecified hand: Secondary | ICD-10-CM | POA: Diagnosis not present

## 2021-02-07 DIAGNOSIS — M25641 Stiffness of right hand, not elsewhere classified: Secondary | ICD-10-CM | POA: Diagnosis not present

## 2021-02-07 DIAGNOSIS — R208 Other disturbances of skin sensation: Secondary | ICD-10-CM | POA: Diagnosis not present

## 2021-02-07 DIAGNOSIS — M25642 Stiffness of left hand, not elsewhere classified: Secondary | ICD-10-CM | POA: Diagnosis not present

## 2021-02-07 DIAGNOSIS — M3489 Other systemic sclerosis: Secondary | ICD-10-CM | POA: Diagnosis not present

## 2021-02-07 DIAGNOSIS — R29898 Other symptoms and signs involving the musculoskeletal system: Secondary | ICD-10-CM | POA: Diagnosis not present

## 2021-02-12 DIAGNOSIS — N3021 Other chronic cystitis with hematuria: Secondary | ICD-10-CM | POA: Diagnosis not present

## 2021-02-12 DIAGNOSIS — M25552 Pain in left hip: Secondary | ICD-10-CM | POA: Diagnosis not present

## 2021-02-12 DIAGNOSIS — R3982 Chronic bladder pain: Secondary | ICD-10-CM | POA: Diagnosis not present

## 2021-02-12 DIAGNOSIS — M62838 Other muscle spasm: Secondary | ICD-10-CM | POA: Diagnosis not present

## 2021-02-12 DIAGNOSIS — M6281 Muscle weakness (generalized): Secondary | ICD-10-CM | POA: Diagnosis not present

## 2021-02-12 DIAGNOSIS — M349 Systemic sclerosis, unspecified: Secondary | ICD-10-CM | POA: Diagnosis not present

## 2021-02-12 DIAGNOSIS — R102 Pelvic and perineal pain: Secondary | ICD-10-CM | POA: Diagnosis not present

## 2021-02-13 ENCOUNTER — Other Ambulatory Visit: Payer: Self-pay

## 2021-02-13 DIAGNOSIS — H2513 Age-related nuclear cataract, bilateral: Secondary | ICD-10-CM | POA: Diagnosis not present

## 2021-02-13 DIAGNOSIS — H25813 Combined forms of age-related cataract, bilateral: Secondary | ICD-10-CM | POA: Diagnosis not present

## 2021-02-13 DIAGNOSIS — H524 Presbyopia: Secondary | ICD-10-CM | POA: Diagnosis not present

## 2021-02-13 DIAGNOSIS — H52223 Regular astigmatism, bilateral: Secondary | ICD-10-CM | POA: Diagnosis not present

## 2021-02-13 DIAGNOSIS — H5203 Hypermetropia, bilateral: Secondary | ICD-10-CM | POA: Diagnosis not present

## 2021-02-13 DIAGNOSIS — C50412 Malignant neoplasm of upper-outer quadrant of left female breast: Secondary | ICD-10-CM

## 2021-02-13 DIAGNOSIS — Z170 Estrogen receptor positive status [ER+]: Secondary | ICD-10-CM

## 2021-02-14 ENCOUNTER — Inpatient Hospital Stay: Payer: Medicare Other

## 2021-02-14 ENCOUNTER — Inpatient Hospital Stay: Payer: Medicare Other | Attending: Oncology | Admitting: Oncology

## 2021-02-14 ENCOUNTER — Encounter: Payer: Self-pay | Admitting: Oncology

## 2021-02-14 ENCOUNTER — Other Ambulatory Visit: Payer: Self-pay

## 2021-02-14 VITALS — BP 128/66 | HR 74 | Temp 97.7°F | Resp 18 | Ht 63.5 in | Wt 148.5 lb

## 2021-02-14 DIAGNOSIS — M349 Systemic sclerosis, unspecified: Secondary | ICD-10-CM | POA: Diagnosis not present

## 2021-02-14 DIAGNOSIS — M8580 Other specified disorders of bone density and structure, unspecified site: Secondary | ICD-10-CM | POA: Diagnosis not present

## 2021-02-14 DIAGNOSIS — Z79899 Other long term (current) drug therapy: Secondary | ICD-10-CM | POA: Diagnosis not present

## 2021-02-14 DIAGNOSIS — I7300 Raynaud's syndrome without gangrene: Secondary | ICD-10-CM | POA: Diagnosis not present

## 2021-02-14 DIAGNOSIS — E039 Hypothyroidism, unspecified: Secondary | ICD-10-CM | POA: Insufficient documentation

## 2021-02-14 DIAGNOSIS — Z853 Personal history of malignant neoplasm of breast: Secondary | ICD-10-CM | POA: Diagnosis not present

## 2021-02-14 DIAGNOSIS — C50412 Malignant neoplasm of upper-outer quadrant of left female breast: Secondary | ICD-10-CM | POA: Diagnosis not present

## 2021-02-14 DIAGNOSIS — M3500 Sicca syndrome, unspecified: Secondary | ICD-10-CM | POA: Diagnosis not present

## 2021-02-14 DIAGNOSIS — M85852 Other specified disorders of bone density and structure, left thigh: Secondary | ICD-10-CM | POA: Insufficient documentation

## 2021-02-14 DIAGNOSIS — K635 Polyp of colon: Secondary | ICD-10-CM | POA: Diagnosis not present

## 2021-02-14 DIAGNOSIS — M1990 Unspecified osteoarthritis, unspecified site: Secondary | ICD-10-CM | POA: Diagnosis not present

## 2021-02-14 DIAGNOSIS — K219 Gastro-esophageal reflux disease without esophagitis: Secondary | ICD-10-CM | POA: Insufficient documentation

## 2021-02-14 DIAGNOSIS — E785 Hyperlipidemia, unspecified: Secondary | ICD-10-CM | POA: Diagnosis not present

## 2021-02-14 DIAGNOSIS — Z923 Personal history of irradiation: Secondary | ICD-10-CM | POA: Insufficient documentation

## 2021-02-14 DIAGNOSIS — K449 Diaphragmatic hernia without obstruction or gangrene: Secondary | ICD-10-CM | POA: Insufficient documentation

## 2021-02-14 DIAGNOSIS — Z170 Estrogen receptor positive status [ER+]: Secondary | ICD-10-CM | POA: Diagnosis not present

## 2021-02-14 DIAGNOSIS — K222 Esophageal obstruction: Secondary | ICD-10-CM | POA: Diagnosis not present

## 2021-02-14 LAB — CMP (CANCER CENTER ONLY)
ALT: 15 U/L (ref 0–44)
AST: 21 U/L (ref 15–41)
Albumin: 3.9 g/dL (ref 3.5–5.0)
Alkaline Phosphatase: 77 U/L (ref 38–126)
Anion gap: 11 (ref 5–15)
BUN: 13 mg/dL (ref 8–23)
CO2: 25 mmol/L (ref 22–32)
Calcium: 9.6 mg/dL (ref 8.9–10.3)
Chloride: 104 mmol/L (ref 98–111)
Creatinine: 1.12 mg/dL — ABNORMAL HIGH (ref 0.44–1.00)
GFR, Estimated: 53 mL/min — ABNORMAL LOW (ref >60)
Glucose, Bld: 94 mg/dL (ref 70–99)
Potassium: 4 mmol/L (ref 3.5–5.1)
Sodium: 140 mmol/L (ref 135–145)
Total Bilirubin: 0.5 mg/dL (ref 0.3–1.2)
Total Protein: 7 g/dL (ref 6.5–8.1)

## 2021-02-14 LAB — CBC WITH DIFFERENTIAL (CANCER CENTER ONLY)
Abs Immature Granulocytes: 0.03 10*3/uL (ref 0.00–0.07)
Basophils Absolute: 0.1 10*3/uL (ref 0.0–0.1)
Basophils Relative: 1 %
Eosinophils Absolute: 0.2 10*3/uL (ref 0.0–0.5)
Eosinophils Relative: 2 %
HCT: 36.8 % (ref 36.0–46.0)
Hemoglobin: 12 g/dL (ref 12.0–15.0)
Immature Granulocytes: 0 %
Lymphocytes Relative: 13 %
Lymphs Abs: 1 10*3/uL (ref 0.7–4.0)
MCH: 29.5 pg (ref 26.0–34.0)
MCHC: 32.6 g/dL (ref 30.0–36.0)
MCV: 90.4 fL (ref 80.0–100.0)
Monocytes Absolute: 0.7 10*3/uL (ref 0.1–1.0)
Monocytes Relative: 9 %
Neutro Abs: 5.6 10*3/uL (ref 1.7–7.7)
Neutrophils Relative %: 75 %
Platelet Count: 231 10*3/uL (ref 150–400)
RBC: 4.07 MIL/uL (ref 3.87–5.11)
RDW: 14.5 % (ref 11.5–15.5)
WBC Count: 7.5 10*3/uL (ref 4.0–10.5)
nRBC: 0 % (ref 0.0–0.2)

## 2021-02-14 NOTE — Progress Notes (Signed)
ID: [name]   DOB: 03-19-1951  MR#: [id number]  CSN#:[id number]   PCP: Ginger Organ., MD Patient Care Team: Ginger Organ., MD as PCP - General (Internal Medicine) [name], Jennifer Dad, MD (Hematology and Oncology) Jennifer Berthold, DO as Consulting Physician (Neurology) Jennifer Holler, MD Jennifer Overman Leata Mouse, MD as Referring Physician (Rheumatology) Jennifer Pitter, MD as Referring Physician (Pulmonary Disease) Jennifer Salon, MD as Consulting Physician (Gynecology) Jennifer Delay, MD as Referring Physician (Internal Medicine) Jennifer Matin, MD as Consulting Physician (Dermatology) Jennifer Salon, MD as Consulting Physician (Gynecology) [name], Jennifer Lines, MD as Consulting Physician (Gastroenterology)   CHIEF COMPLAINT: Estrogen receptor positive breast cancer  CURRENT TREATMENT: Observation   INTERVAL HISTORY: [name] returns today for follow-up of her estrogen receptor positive breast cancer.  She went off tamoxifen in August 2021 partly because of concerns with swallowing the pill, which was increasingly difficult for her.  She certainly felt better overall within a few months of stopping the medication.  She feels this was definitely the right decision for her  Because of her history of scleroderma, we are avoiding any unnecessary radiation.  Accordingly she is followed not with mammography but with yearly breast MRI.    Since her last visit here, she underwent a bilateral breast MRI with and without contrast on 01/31/2021 showing: Breast Density Category C. There is no MRI malignancy in either breast.   She also underwent bone density screening on 04/27/2020 showing a T-score of -1.8, which is considered osteopenic.   REVIEW OF SYSTEMS:  [name] is doing intensive rehab both of her hands and regular rehab.  As a result she feels her grip is much better although her strength is still poor.  She has been told by her physicians at [facility] that her hands  really will never fully recover.  [name] is hoping that her feet do not follow the same course and she is very attentive to that.  She went off gabapentin and therefore has more pain but is taking relief factor for that.  She is walking regularly.  She has not had falls.  A detailed review of systems today was otherwise stable.   COVID 19 VACCINATION STATUS: Refuses vaccination out of concern that it might exacerbate her Sjogren's.  She did have COVID January 2022.  It took her several weeks to get over her symptoms but she did not require hospitalization.   BREAST CANCER HISTORY: From the original intake note:  The patient had routine screening mammography at [facility] 08/27/2011, suggesting a potential abnormality in the left breast. Additional views 08/29/2011 confirmed an irregular mass with architectural or distortion measuring up to 4 cm in the left breast, without associated microcalcifications. By ultrasound this was irregular, and showed posterior shadowing. It measured 3.6 cm sonographically. There were no suspicious lymph nodes noted in the left axilla.  The same day the patient underwent ultrasound-guided biopsy of the left breast mass, showing ([id number]) and invasive ductal carcinoma, grade 1, which was 100% estrogen receptor and 97% progesterone receptor positive. The proliferation marker was 20%. There was no HER-2 amplification with a ratio by CISH of 1.09.  The patient had bilateral breast MRI 09/03/2011 showing a solitary 3.8 cm left breast lesion, and on 11/04/2011 underwent left lumpectomy and sentinel lymph node sampling with results and subsequent history as detailed below.   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 Ruthann Cancer at [facility]   systemic sclerosis , diffuse/   rheumotologist:  dr Manuella Ghazi at [facility]---  lov 07-22-2017 (as of 07-25-2017 note not available to read)---  per pt has appt. w/ specialist at [facility]   Skin thickening    hands, arms, legs  caused by scleroderma   Systemic sclerosis ([facility])    UTI (urinary tract infection)     PAST SURGICAL HISTORY: Past Surgical History:  Procedure Laterality Date   BIOPSY  01/29/2018   Procedure: BIOPSY;  Surgeon: Jennifer Bears, MD;  [facility];  Service: Gastroenterology;;   [name]  2000   Left   COLONOSCOPY WITH PROPOFOL N/A 01/29/2018   Procedure: COLONOSCOPY WITH PROPOFOL;  Surgeon: Jennifer Bears, MD;  Location: [facility];  Service: Gastroenterology;  Laterality: N/A;   DILATATION & CURETTAGE/HYSTEROSCOPY WITH MYOSURE N/A 08/04/2017   Procedure: DILATATION & CURETTAGE/HYSTEROSCOPY WITH MYOSURE;  Surgeon: Jennifer Salon, MD;  Location: [facility];  Service: Gynecology;  Laterality: N/A;   ECTOPIC PREGNANCY SURGERY  1986-88   s/p unilateral salpingectomy   ESOPHAGOGASTRODUODENOSCOPY (EGD) WITH PROPOFOL N/A 01/29/2018   Procedure: ESOPHAGOGASTRODUODENOSCOPY (EGD) WITH PROPOFOL;  Surgeon: Jennifer Bears, MD;  [facility];  Service: Gastroenterology;  Laterality: N/A;   PARTIAL MASTECTOMY WITH AXILLARY SENTINEL LYMPH NODE BIOPSY Left 11-04-2011   dr Margot Chimes  [facility]   TRANSTHORACIC ECHOCARDIOGRAM  06-04-2017    [facility]   moderate LVH, ef>55%/  trivial AR and TR/ mild MR and PR/  trivial pericardial effusion   WISDOM TOOTH EXTRACTION      FAMILY HISTORY Family History  Problem Relation Age of Onset   Heart disease Mother    Heart failure Mother    Heart disease Father    Ovarian cancer Maternal Aunt    Heart disease Paternal Uncle        multiple   Autoimmune disease Sister    Arrhythmia Sister    Anemia Sister    CAD Brother    Colon cancer Neg Hx    Stomach cancer Neg Hx    Esophageal cancer Neg Hx    Rectal cancer Neg Hx   The patient's father died at the age of 69 from myocardial infarction. The patient's mother died from congestive heart failure at the age of 60. The patient has one brother age 89 with prostate cancer diagnosed at age 58. The patient's sister is 15 as of 2013. There is no breast  or ovarian cancer in the family   GYNECOLOGIC HISTORY:  (Reviewed 12/27/2013) She had menarche at around age 41. She is GX P1, first pregnancy to term age 65. She stopped having periods about 2003. She did not use hormone replacement   SOCIAL HISTORY:  (Updated 02/15/2019). 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 Her husband Rush Landmark  has had prostate cancer, status post radiation treatments in [place] about 7 years ago. Her daughter Lanelle Bal lives in [place] with her husband and 2 children. 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 The patient attends the [employer].   ADVANCED DIRECTIVES: in place   HEALTH MAINTENANCE: Social History   Tobacco Use   Smoking status: Never   Smokeless tobacco: Never  Vaping Use   Vaping Use: Never used  Substance Use Topics   Alcohol use: Yes    Comment: Drinks one mixed drink  nightly   Drug use: No     Colonoscopy: 01/29/2018  PAP: UTD Tobie Poet)  Bone density: June 2013, osteopenia with T score -1.4  Lipid panel: June 2014  Allergies  Allergen Reactions   Betadine [Povidone Iodine] Rash   Contrast Media [Iodinated Diagnostic Agents] Rash    "per pt recently had CT 08/ 2018 even through given pre-medication, IVP still caused rash"   Epinephrine Other (See Comments)    Sever headaches   Oxycodone Anxiety   Nickel Rash and Other (See Comments)    Rash and blisters   Other Rash and Other (See Comments)    Hospital gown   Penicillins Rash and Other (See Comments)    Rash and blisters Has patient had a PCN reaction causing immediate rash, facial/tongue/throat swelling, SOB or lightheadedness with hypotension: Yes Has patient had a PCN reaction causing severe rash involving mucus membranes or skin necrosis: No Has patient had a PCN reaction that required hospitalization: No Has patient had a PCN reaction occurring within the last 10 years: No If all of the above answers are "NO", then may proceed with Cephalosporin use.    Sulfa Antibiotics Rash    Current Outpatient Medications  Medication Sig Dispense Refill   ELDERBERRY PO Take by mouth 2 (two) times daily.     furosemide (LASIX) 20 MG tablet Take 20 mg by mouth daily.     mycophenolate (CELLCEPT) 250 MG capsule Take 2,500 mg by mouth daily.     nitrofurantoin, macrocrystal-monohydrate, (MACROBID) 100 MG capsule Take 1 capsule (100 mg total) by mouth 2 (two) times daily. 10 capsule 0   OVER THE COUNTER MEDICATION Relief Factor     SYNTHROID 100 MCG tablet Take 88 mcg by mouth daily before breakfast.     No current facility-administered medications for this visit.    OBJECTIVE: White woman who appears younger than stated age  61:   02/14/21 1443  BP: 128/66  Pulse: 74  Resp: 18  Temp: 97.7 F (36.5 C)      Body mass index is 25.89 kg/m.    ECOG FS: 1 Filed Weights    02/14/21 1443  Weight: 148 lb 8 oz (67.4 kg)    Sclerae unicteric, EOMs intact Wearing a mask No cervical or supraclavicular adenopathy Lungs no rales or rhonchi Heart regular rate and rhythm Abd soft, nontender, positive bowel sounds MSK no focal spinal tenderness Neuro: nonfocal, well oriented, appropriate affect Breasts: The right breast is unremarkable.  The left breast is benign, with no  evidence of recurrence, and with a chronically retracted nipple.  Both axillae are benign.   LAB RESULTS: Lab Results  Component Value Date   WBC 7.5 02/14/2021   NEUTROABS 5.6 02/14/2021   HGB 12.0 02/14/2021   HCT 36.8 02/14/2021   MCV 90.4 02/14/2021   PLT 231 02/14/2021      Chemistry      Component Value Date/Time   NA 140 02/14/2021 1422   NA 144 01/27/2020 1148   NA 141 06/09/2017 1117   K 4.0 02/14/2021 1422   K 3.9 06/09/2017 1117   CL 104 02/14/2021 1422   CL 109 (H) 12/14/2012 1253   CO2 25 02/14/2021 1422   CO2 23 06/09/2017 1117   BUN 13 02/14/2021 1422   BUN 11 01/27/2020 1148   BUN 11.5 06/09/2017 1117   CREATININE 1.12 (H) 02/14/2021 1422   CREATININE 1.2 (H) 06/09/2017 1117      Component Value Date/Time   CALCIUM 9.6 02/14/2021 1422   CALCIUM 8.9 06/09/2017 1117   ALKPHOS 77 02/14/2021 1422   ALKPHOS 40 06/09/2017 1117   AST 21 02/14/2021 1422   AST 35 (H) 06/09/2017 1117   ALT 15 02/14/2021 1422   ALT 19 06/09/2017 1117   BILITOT 0.5 02/14/2021 1422   BILITOT 0.37 06/09/2017 1117      STUDIES: MR BREAST BILATERAL W WO CONTRAST INC CAD  Result Date: 01/31/2021 CLINICAL DATA:  History of LEFT lumpectomy for breast cancer in 2013, followed by radiation. Dense breast tissue. History of scleroderma. Patient does not have routine mammography due to the history of scleroderma. LABS:  None obtained at the time of imaging. EXAM: BILATERAL BREAST MRI WITH AND WITHOUT CONTRAST TECHNIQUE: Multiplanar, multisequence MR images of both breasts were obtained prior  to and following the intravenous administration of 6 ml of Gadavist Three-dimensional MR images were rendered by post-processing of the original MR data on an independent workstation. The three-dimensional MR images were interpreted, and findings are reported in the following complete MRI report for this study. Three dimensional images were evaluated at the independent interpreting workstation using the DynaCAD thin client. COMPARISON:  MRI 02/10/2020 and numerous earlier exams FINDINGS: Breast composition: c. Heterogeneous fibroglandular tissue. Background parenchymal enhancement: Moderate. Right breast: No mass or abnormal enhancement. Left breast: Postoperative changes are identified in the LATERAL portion of the LEFT breast. Lymph nodes: No abnormal appearing lymph nodes. Ancillary findings:  None. IMPRESSION: No MRI evidence for malignancy in either breast. Postoperative changes in the LEFT breast. RECOMMENDATION: Recommend bilateral screening MRI in 1 year. BI-RADS CATEGORY  2: Benign. Electronically Signed   By: Nolon Nations M.D.   On: 01/31/2021 13:06     ASSESSMENT: 70 y.o.  [name] woman   (1)  status post left lumpectomy 11/04/2011 for a pT2 pN0, stage IIA invasive ductal carcinoma, grade 2, strongly estrogen and progesterone receptor positive, HER-2 negative, with an MIB-1 of 20%.   (2)  completed radiation treatments June 2013    (3)  started tamoxifen mid-July 2013, discontinued August 2021  (a) endometrial thickening, status post multiple biopsies 04/04/2017 showing no atypia dysplasia or malignancy  (b) D&C with hysteroscopy 08/04/2017 showed inactive endometrium and a benign polyp  (4) breast density category C: followed with yearly MRI (due to scleroderma)  (a) status post breast MRI in April 2015 showing 2 abnormalities in the right breast; biopsy of both spots 11/19/2013 showed only fibrocystic PASH   (5) osteopenia with a T score of -1.5 at the  left femoral neck  04/18/2014  (a) on vitamin D supplementation, weightbearing exercise program, and tamoxifen  (6) scleroderma, with significant arthritis, thickened digits, lung changes, and swallowing difficulties  (a) on mycophenolate per [facility] protocol  (B ) not a candidate for mammography due to radiation concerns: following breast cancer with yearly breast MRI   PLAN:  [name] is now a little over 9 years out from definitive surgery for her breast cancer with no evidence of disease recurrence.  This is very favorable.  She is making heroic efforts to maintain function despite her scleroderma and I think she is making progress.  She certainly has the right attitude.  She has excellent rheumatologic follow-up at [place].  At this point I feel comfortable releasing her to her primary care physicians.  All she will need is a yearly physician breast exam and a yearly breast MRI both of which can be organized through her gynecologist Dr. Sabra Heck.  Accordingly we are making no further routine appointments for [name] here although of course we will be glad to see her at any point in the future if and when the need arises  Total encounter time 25 minutes.*   [name], Jennifer Dad, MD  02/14/21 4:46 PM Medical Oncology and Hematology [facility] [place], [place] [address] Tel. [phone]    Fax. [phone]    I, Wilburn Mylar, am acting as scribe for Dr. Virgie [name]. [name].  I, Lurline Del MD, have reviewed the above documentation for accuracy and completeness, and I agree with the above.   *Total Encounter Time as defined by the Centers for Medicare and Medicaid Services includes, in addition to the face-to-face time of a patient visit (documented in the note above) non-face-to-face time: obtaining and reviewing outside history, ordering and reviewing medications, tests or procedures, care coordination (communications with other health care  professionals or caregivers) and documentation in the medical record.

## 2021-02-23 DIAGNOSIS — M3489 Other systemic sclerosis: Secondary | ICD-10-CM | POA: Diagnosis not present

## 2021-02-23 DIAGNOSIS — M79646 Pain in unspecified finger(s): Secondary | ICD-10-CM | POA: Diagnosis not present

## 2021-02-23 DIAGNOSIS — M79643 Pain in unspecified hand: Secondary | ICD-10-CM | POA: Diagnosis not present

## 2021-02-23 DIAGNOSIS — R29898 Other symptoms and signs involving the musculoskeletal system: Secondary | ICD-10-CM | POA: Diagnosis not present

## 2021-02-23 DIAGNOSIS — M25642 Stiffness of left hand, not elsewhere classified: Secondary | ICD-10-CM | POA: Diagnosis not present

## 2021-02-23 DIAGNOSIS — R208 Other disturbances of skin sensation: Secondary | ICD-10-CM | POA: Diagnosis not present

## 2021-02-23 DIAGNOSIS — M25641 Stiffness of right hand, not elsewhere classified: Secondary | ICD-10-CM | POA: Diagnosis not present

## 2021-02-28 DIAGNOSIS — M6289 Other specified disorders of muscle: Secondary | ICD-10-CM | POA: Diagnosis not present

## 2021-02-28 DIAGNOSIS — M25552 Pain in left hip: Secondary | ICD-10-CM | POA: Diagnosis not present

## 2021-02-28 DIAGNOSIS — R102 Pelvic and perineal pain: Secondary | ICD-10-CM | POA: Diagnosis not present

## 2021-02-28 DIAGNOSIS — M6281 Muscle weakness (generalized): Secondary | ICD-10-CM | POA: Diagnosis not present

## 2021-02-28 DIAGNOSIS — M62838 Other muscle spasm: Secondary | ICD-10-CM | POA: Diagnosis not present

## 2021-03-12 DIAGNOSIS — M349 Systemic sclerosis, unspecified: Secondary | ICD-10-CM | POA: Diagnosis not present

## 2021-03-12 DIAGNOSIS — R102 Pelvic and perineal pain: Secondary | ICD-10-CM | POA: Diagnosis not present

## 2021-03-12 DIAGNOSIS — M6281 Muscle weakness (generalized): Secondary | ICD-10-CM | POA: Diagnosis not present

## 2021-03-12 DIAGNOSIS — R3982 Chronic bladder pain: Secondary | ICD-10-CM | POA: Diagnosis not present

## 2021-03-12 DIAGNOSIS — M62838 Other muscle spasm: Secondary | ICD-10-CM | POA: Diagnosis not present

## 2021-03-20 DIAGNOSIS — R0602 Shortness of breath: Secondary | ICD-10-CM | POA: Diagnosis not present

## 2021-03-20 DIAGNOSIS — M349 Systemic sclerosis, unspecified: Secondary | ICD-10-CM | POA: Diagnosis not present

## 2021-03-20 DIAGNOSIS — E038 Other specified hypothyroidism: Secondary | ICD-10-CM | POA: Diagnosis not present

## 2021-03-21 DIAGNOSIS — R29898 Other symptoms and signs involving the musculoskeletal system: Secondary | ICD-10-CM | POA: Diagnosis not present

## 2021-03-21 DIAGNOSIS — M3489 Other systemic sclerosis: Secondary | ICD-10-CM | POA: Diagnosis not present

## 2021-03-21 DIAGNOSIS — M25642 Stiffness of left hand, not elsewhere classified: Secondary | ICD-10-CM | POA: Diagnosis not present

## 2021-03-21 DIAGNOSIS — M79643 Pain in unspecified hand: Secondary | ICD-10-CM | POA: Diagnosis not present

## 2021-03-21 DIAGNOSIS — M25641 Stiffness of right hand, not elsewhere classified: Secondary | ICD-10-CM | POA: Diagnosis not present

## 2021-03-21 DIAGNOSIS — M79646 Pain in unspecified finger(s): Secondary | ICD-10-CM | POA: Diagnosis not present

## 2021-03-21 DIAGNOSIS — R208 Other disturbances of skin sensation: Secondary | ICD-10-CM | POA: Diagnosis not present

## 2021-03-26 DIAGNOSIS — R102 Pelvic and perineal pain: Secondary | ICD-10-CM | POA: Diagnosis not present

## 2021-03-26 DIAGNOSIS — R3982 Chronic bladder pain: Secondary | ICD-10-CM | POA: Diagnosis not present

## 2021-03-26 DIAGNOSIS — M25552 Pain in left hip: Secondary | ICD-10-CM | POA: Diagnosis not present

## 2021-03-26 DIAGNOSIS — M6281 Muscle weakness (generalized): Secondary | ICD-10-CM | POA: Diagnosis not present

## 2021-03-26 DIAGNOSIS — M62838 Other muscle spasm: Secondary | ICD-10-CM | POA: Diagnosis not present

## 2021-03-26 DIAGNOSIS — N3021 Other chronic cystitis with hematuria: Secondary | ICD-10-CM | POA: Diagnosis not present

## 2021-04-03 DIAGNOSIS — M25641 Stiffness of right hand, not elsewhere classified: Secondary | ICD-10-CM | POA: Diagnosis not present

## 2021-04-03 DIAGNOSIS — M79646 Pain in unspecified finger(s): Secondary | ICD-10-CM | POA: Diagnosis not present

## 2021-04-03 DIAGNOSIS — R29898 Other symptoms and signs involving the musculoskeletal system: Secondary | ICD-10-CM | POA: Diagnosis not present

## 2021-04-03 DIAGNOSIS — R208 Other disturbances of skin sensation: Secondary | ICD-10-CM | POA: Diagnosis not present

## 2021-04-03 DIAGNOSIS — M25642 Stiffness of left hand, not elsewhere classified: Secondary | ICD-10-CM | POA: Diagnosis not present

## 2021-04-03 DIAGNOSIS — M3489 Other systemic sclerosis: Secondary | ICD-10-CM | POA: Diagnosis not present

## 2021-04-03 DIAGNOSIS — M79643 Pain in unspecified hand: Secondary | ICD-10-CM | POA: Diagnosis not present

## 2021-04-09 DIAGNOSIS — M349 Systemic sclerosis, unspecified: Secondary | ICD-10-CM | POA: Diagnosis not present

## 2021-04-09 DIAGNOSIS — R102 Pelvic and perineal pain: Secondary | ICD-10-CM | POA: Diagnosis not present

## 2021-04-09 DIAGNOSIS — M62838 Other muscle spasm: Secondary | ICD-10-CM | POA: Diagnosis not present

## 2021-04-09 DIAGNOSIS — M6281 Muscle weakness (generalized): Secondary | ICD-10-CM | POA: Diagnosis not present

## 2021-04-09 DIAGNOSIS — R3982 Chronic bladder pain: Secondary | ICD-10-CM | POA: Diagnosis not present

## 2021-04-09 DIAGNOSIS — M25552 Pain in left hip: Secondary | ICD-10-CM | POA: Diagnosis not present

## 2021-04-09 DIAGNOSIS — N3021 Other chronic cystitis with hematuria: Secondary | ICD-10-CM | POA: Diagnosis not present

## 2021-04-17 DIAGNOSIS — M25552 Pain in left hip: Secondary | ICD-10-CM | POA: Diagnosis not present

## 2021-04-17 DIAGNOSIS — N3021 Other chronic cystitis with hematuria: Secondary | ICD-10-CM | POA: Diagnosis not present

## 2021-04-17 DIAGNOSIS — M6281 Muscle weakness (generalized): Secondary | ICD-10-CM | POA: Diagnosis not present

## 2021-04-17 DIAGNOSIS — M62838 Other muscle spasm: Secondary | ICD-10-CM | POA: Diagnosis not present

## 2021-04-17 DIAGNOSIS — R102 Pelvic and perineal pain: Secondary | ICD-10-CM | POA: Diagnosis not present

## 2021-04-17 DIAGNOSIS — R3982 Chronic bladder pain: Secondary | ICD-10-CM | POA: Diagnosis not present

## 2021-04-30 DIAGNOSIS — M6281 Muscle weakness (generalized): Secondary | ICD-10-CM | POA: Diagnosis not present

## 2021-04-30 DIAGNOSIS — R102 Pelvic and perineal pain: Secondary | ICD-10-CM | POA: Diagnosis not present

## 2021-04-30 DIAGNOSIS — M25552 Pain in left hip: Secondary | ICD-10-CM | POA: Diagnosis not present

## 2021-04-30 DIAGNOSIS — N3021 Other chronic cystitis with hematuria: Secondary | ICD-10-CM | POA: Diagnosis not present

## 2021-04-30 DIAGNOSIS — R3982 Chronic bladder pain: Secondary | ICD-10-CM | POA: Diagnosis not present

## 2021-05-01 DIAGNOSIS — L820 Inflamed seborrheic keratosis: Secondary | ICD-10-CM | POA: Diagnosis not present

## 2021-05-01 DIAGNOSIS — L821 Other seborrheic keratosis: Secondary | ICD-10-CM | POA: Diagnosis not present

## 2021-05-01 DIAGNOSIS — L308 Other specified dermatitis: Secondary | ICD-10-CM | POA: Diagnosis not present

## 2021-05-01 DIAGNOSIS — Z85828 Personal history of other malignant neoplasm of skin: Secondary | ICD-10-CM | POA: Diagnosis not present

## 2021-05-07 DIAGNOSIS — M25642 Stiffness of left hand, not elsewhere classified: Secondary | ICD-10-CM | POA: Diagnosis not present

## 2021-05-07 DIAGNOSIS — M25641 Stiffness of right hand, not elsewhere classified: Secondary | ICD-10-CM | POA: Diagnosis not present

## 2021-05-07 DIAGNOSIS — M79646 Pain in unspecified finger(s): Secondary | ICD-10-CM | POA: Diagnosis not present

## 2021-05-07 DIAGNOSIS — M79643 Pain in unspecified hand: Secondary | ICD-10-CM | POA: Diagnosis not present

## 2021-05-07 DIAGNOSIS — R208 Other disturbances of skin sensation: Secondary | ICD-10-CM | POA: Diagnosis not present

## 2021-05-07 DIAGNOSIS — R29898 Other symptoms and signs involving the musculoskeletal system: Secondary | ICD-10-CM | POA: Diagnosis not present

## 2021-05-07 DIAGNOSIS — M3489 Other systemic sclerosis: Secondary | ICD-10-CM | POA: Diagnosis not present

## 2021-05-14 DIAGNOSIS — M62838 Other muscle spasm: Secondary | ICD-10-CM | POA: Diagnosis not present

## 2021-05-14 DIAGNOSIS — R3982 Chronic bladder pain: Secondary | ICD-10-CM | POA: Diagnosis not present

## 2021-05-14 DIAGNOSIS — M25552 Pain in left hip: Secondary | ICD-10-CM | POA: Diagnosis not present

## 2021-05-14 DIAGNOSIS — M6289 Other specified disorders of muscle: Secondary | ICD-10-CM | POA: Diagnosis not present

## 2021-05-14 DIAGNOSIS — M6281 Muscle weakness (generalized): Secondary | ICD-10-CM | POA: Diagnosis not present

## 2021-05-14 DIAGNOSIS — R102 Pelvic and perineal pain: Secondary | ICD-10-CM | POA: Diagnosis not present

## 2021-05-15 DIAGNOSIS — M349 Systemic sclerosis, unspecified: Secondary | ICD-10-CM | POA: Diagnosis not present

## 2021-05-15 DIAGNOSIS — I082 Rheumatic disorders of both aortic and tricuspid valves: Secondary | ICD-10-CM | POA: Diagnosis not present

## 2021-05-15 DIAGNOSIS — R0602 Shortness of breath: Secondary | ICD-10-CM | POA: Diagnosis not present

## 2021-05-21 DIAGNOSIS — M25642 Stiffness of left hand, not elsewhere classified: Secondary | ICD-10-CM | POA: Diagnosis not present

## 2021-05-21 DIAGNOSIS — M79643 Pain in unspecified hand: Secondary | ICD-10-CM | POA: Diagnosis not present

## 2021-05-21 DIAGNOSIS — M3489 Other systemic sclerosis: Secondary | ICD-10-CM | POA: Diagnosis not present

## 2021-05-21 DIAGNOSIS — R208 Other disturbances of skin sensation: Secondary | ICD-10-CM | POA: Diagnosis not present

## 2021-05-21 DIAGNOSIS — R29898 Other symptoms and signs involving the musculoskeletal system: Secondary | ICD-10-CM | POA: Diagnosis not present

## 2021-05-21 DIAGNOSIS — M79646 Pain in unspecified finger(s): Secondary | ICD-10-CM | POA: Diagnosis not present

## 2021-05-21 DIAGNOSIS — M25641 Stiffness of right hand, not elsewhere classified: Secondary | ICD-10-CM | POA: Diagnosis not present

## 2021-05-28 DIAGNOSIS — R102 Pelvic and perineal pain: Secondary | ICD-10-CM | POA: Diagnosis not present

## 2021-05-28 DIAGNOSIS — M62838 Other muscle spasm: Secondary | ICD-10-CM | POA: Diagnosis not present

## 2021-05-28 DIAGNOSIS — R3982 Chronic bladder pain: Secondary | ICD-10-CM | POA: Diagnosis not present

## 2021-05-28 DIAGNOSIS — M6281 Muscle weakness (generalized): Secondary | ICD-10-CM | POA: Diagnosis not present

## 2021-05-28 DIAGNOSIS — M25552 Pain in left hip: Secondary | ICD-10-CM | POA: Diagnosis not present

## 2021-05-28 DIAGNOSIS — M349 Systemic sclerosis, unspecified: Secondary | ICD-10-CM | POA: Diagnosis not present

## 2021-05-28 DIAGNOSIS — N3021 Other chronic cystitis with hematuria: Secondary | ICD-10-CM | POA: Diagnosis not present

## 2021-05-28 DIAGNOSIS — M6289 Other specified disorders of muscle: Secondary | ICD-10-CM | POA: Diagnosis not present

## 2021-06-04 DIAGNOSIS — R29898 Other symptoms and signs involving the musculoskeletal system: Secondary | ICD-10-CM | POA: Diagnosis not present

## 2021-06-04 DIAGNOSIS — M3489 Other systemic sclerosis: Secondary | ICD-10-CM | POA: Diagnosis not present

## 2021-06-04 DIAGNOSIS — M25642 Stiffness of left hand, not elsewhere classified: Secondary | ICD-10-CM | POA: Diagnosis not present

## 2021-06-04 DIAGNOSIS — M79646 Pain in unspecified finger(s): Secondary | ICD-10-CM | POA: Diagnosis not present

## 2021-06-04 DIAGNOSIS — M79643 Pain in unspecified hand: Secondary | ICD-10-CM | POA: Diagnosis not present

## 2021-06-04 DIAGNOSIS — R208 Other disturbances of skin sensation: Secondary | ICD-10-CM | POA: Diagnosis not present

## 2021-06-04 DIAGNOSIS — M25641 Stiffness of right hand, not elsewhere classified: Secondary | ICD-10-CM | POA: Diagnosis not present

## 2021-06-11 DIAGNOSIS — M6281 Muscle weakness (generalized): Secondary | ICD-10-CM | POA: Diagnosis not present

## 2021-06-11 DIAGNOSIS — M349 Systemic sclerosis, unspecified: Secondary | ICD-10-CM | POA: Diagnosis not present

## 2021-06-11 DIAGNOSIS — R102 Pelvic and perineal pain: Secondary | ICD-10-CM | POA: Diagnosis not present

## 2021-06-11 DIAGNOSIS — M25552 Pain in left hip: Secondary | ICD-10-CM | POA: Diagnosis not present

## 2021-06-11 DIAGNOSIS — M62838 Other muscle spasm: Secondary | ICD-10-CM | POA: Diagnosis not present

## 2021-06-11 DIAGNOSIS — M6289 Other specified disorders of muscle: Secondary | ICD-10-CM | POA: Diagnosis not present

## 2021-06-19 ENCOUNTER — Encounter (HOSPITAL_BASED_OUTPATIENT_CLINIC_OR_DEPARTMENT_OTHER): Payer: Self-pay | Admitting: Obstetrics & Gynecology

## 2021-06-25 DIAGNOSIS — N3021 Other chronic cystitis with hematuria: Secondary | ICD-10-CM | POA: Diagnosis not present

## 2021-06-25 DIAGNOSIS — R102 Pelvic and perineal pain: Secondary | ICD-10-CM | POA: Diagnosis not present

## 2021-06-25 DIAGNOSIS — M25552 Pain in left hip: Secondary | ICD-10-CM | POA: Diagnosis not present

## 2021-06-25 DIAGNOSIS — M349 Systemic sclerosis, unspecified: Secondary | ICD-10-CM | POA: Diagnosis not present

## 2021-06-25 DIAGNOSIS — M62838 Other muscle spasm: Secondary | ICD-10-CM | POA: Diagnosis not present

## 2021-06-25 DIAGNOSIS — M6281 Muscle weakness (generalized): Secondary | ICD-10-CM | POA: Diagnosis not present

## 2021-07-18 DIAGNOSIS — M25552 Pain in left hip: Secondary | ICD-10-CM | POA: Diagnosis not present

## 2021-07-18 DIAGNOSIS — M6289 Other specified disorders of muscle: Secondary | ICD-10-CM | POA: Diagnosis not present

## 2021-07-18 DIAGNOSIS — Z85828 Personal history of other malignant neoplasm of skin: Secondary | ICD-10-CM | POA: Diagnosis not present

## 2021-07-18 DIAGNOSIS — M6281 Muscle weakness (generalized): Secondary | ICD-10-CM | POA: Diagnosis not present

## 2021-07-18 DIAGNOSIS — R102 Pelvic and perineal pain: Secondary | ICD-10-CM | POA: Diagnosis not present

## 2021-07-18 DIAGNOSIS — B029 Zoster without complications: Secondary | ICD-10-CM | POA: Diagnosis not present

## 2021-07-18 DIAGNOSIS — M62838 Other muscle spasm: Secondary | ICD-10-CM | POA: Diagnosis not present

## 2021-07-18 DIAGNOSIS — B009 Herpesviral infection, unspecified: Secondary | ICD-10-CM | POA: Diagnosis not present

## 2021-07-24 DIAGNOSIS — I7300 Raynaud's syndrome without gangrene: Secondary | ICD-10-CM | POA: Diagnosis not present

## 2021-07-24 DIAGNOSIS — R6881 Early satiety: Secondary | ICD-10-CM | POA: Diagnosis not present

## 2021-07-24 DIAGNOSIS — M349 Systemic sclerosis, unspecified: Secondary | ICD-10-CM | POA: Diagnosis not present

## 2021-07-24 DIAGNOSIS — Z79899 Other long term (current) drug therapy: Secondary | ICD-10-CM | POA: Diagnosis not present

## 2021-07-26 DIAGNOSIS — M6289 Other specified disorders of muscle: Secondary | ICD-10-CM | POA: Diagnosis not present

## 2021-07-26 DIAGNOSIS — M62838 Other muscle spasm: Secondary | ICD-10-CM | POA: Diagnosis not present

## 2021-07-26 DIAGNOSIS — M6281 Muscle weakness (generalized): Secondary | ICD-10-CM | POA: Diagnosis not present

## 2021-07-26 DIAGNOSIS — R102 Pelvic and perineal pain: Secondary | ICD-10-CM | POA: Diagnosis not present

## 2021-07-26 DIAGNOSIS — R3982 Chronic bladder pain: Secondary | ICD-10-CM | POA: Diagnosis not present

## 2021-07-26 DIAGNOSIS — M25552 Pain in left hip: Secondary | ICD-10-CM | POA: Diagnosis not present

## 2021-07-30 DIAGNOSIS — R42 Dizziness and giddiness: Secondary | ICD-10-CM | POA: Diagnosis not present

## 2021-07-30 DIAGNOSIS — Z8619 Personal history of other infectious and parasitic diseases: Secondary | ICD-10-CM | POA: Diagnosis not present

## 2021-07-30 DIAGNOSIS — D849 Immunodeficiency, unspecified: Secondary | ICD-10-CM | POA: Diagnosis not present

## 2021-07-30 DIAGNOSIS — M349 Systemic sclerosis, unspecified: Secondary | ICD-10-CM | POA: Diagnosis not present

## 2021-08-08 DIAGNOSIS — M6289 Other specified disorders of muscle: Secondary | ICD-10-CM | POA: Diagnosis not present

## 2021-08-08 DIAGNOSIS — M25552 Pain in left hip: Secondary | ICD-10-CM | POA: Diagnosis not present

## 2021-08-08 DIAGNOSIS — M62838 Other muscle spasm: Secondary | ICD-10-CM | POA: Diagnosis not present

## 2021-08-08 DIAGNOSIS — M349 Systemic sclerosis, unspecified: Secondary | ICD-10-CM | POA: Diagnosis not present

## 2021-08-08 DIAGNOSIS — R102 Pelvic and perineal pain: Secondary | ICD-10-CM | POA: Diagnosis not present

## 2021-08-08 DIAGNOSIS — M6281 Muscle weakness (generalized): Secondary | ICD-10-CM | POA: Diagnosis not present

## 2021-08-08 DIAGNOSIS — R3982 Chronic bladder pain: Secondary | ICD-10-CM | POA: Diagnosis not present

## 2021-08-13 DIAGNOSIS — M79643 Pain in unspecified hand: Secondary | ICD-10-CM | POA: Diagnosis not present

## 2021-08-13 DIAGNOSIS — M3489 Other systemic sclerosis: Secondary | ICD-10-CM | POA: Diagnosis not present

## 2021-08-13 DIAGNOSIS — R29898 Other symptoms and signs involving the musculoskeletal system: Secondary | ICD-10-CM | POA: Diagnosis not present

## 2021-08-13 DIAGNOSIS — M25641 Stiffness of right hand, not elsewhere classified: Secondary | ICD-10-CM | POA: Diagnosis not present

## 2021-08-13 DIAGNOSIS — R208 Other disturbances of skin sensation: Secondary | ICD-10-CM | POA: Diagnosis not present

## 2021-08-13 DIAGNOSIS — M79646 Pain in unspecified finger(s): Secondary | ICD-10-CM | POA: Diagnosis not present

## 2021-08-13 DIAGNOSIS — M25642 Stiffness of left hand, not elsewhere classified: Secondary | ICD-10-CM | POA: Diagnosis not present

## 2021-08-14 DIAGNOSIS — B0229 Other postherpetic nervous system involvement: Secondary | ICD-10-CM | POA: Diagnosis not present

## 2021-08-14 DIAGNOSIS — M349 Systemic sclerosis, unspecified: Secondary | ICD-10-CM | POA: Diagnosis not present

## 2021-08-20 DIAGNOSIS — M6281 Muscle weakness (generalized): Secondary | ICD-10-CM | POA: Diagnosis not present

## 2021-08-20 DIAGNOSIS — R102 Pelvic and perineal pain: Secondary | ICD-10-CM | POA: Diagnosis not present

## 2021-08-20 DIAGNOSIS — R3982 Chronic bladder pain: Secondary | ICD-10-CM | POA: Diagnosis not present

## 2021-08-20 DIAGNOSIS — M25552 Pain in left hip: Secondary | ICD-10-CM | POA: Diagnosis not present

## 2021-08-20 DIAGNOSIS — N3021 Other chronic cystitis with hematuria: Secondary | ICD-10-CM | POA: Diagnosis not present

## 2021-08-20 DIAGNOSIS — M349 Systemic sclerosis, unspecified: Secondary | ICD-10-CM | POA: Diagnosis not present

## 2021-08-20 DIAGNOSIS — M6289 Other specified disorders of muscle: Secondary | ICD-10-CM | POA: Diagnosis not present

## 2021-08-27 DIAGNOSIS — B028 Zoster with other complications: Secondary | ICD-10-CM | POA: Diagnosis not present

## 2021-08-27 DIAGNOSIS — B029 Zoster without complications: Secondary | ICD-10-CM | POA: Diagnosis not present

## 2021-08-27 DIAGNOSIS — Z85828 Personal history of other malignant neoplasm of skin: Secondary | ICD-10-CM | POA: Diagnosis not present

## 2021-08-28 DIAGNOSIS — R29898 Other symptoms and signs involving the musculoskeletal system: Secondary | ICD-10-CM | POA: Diagnosis not present

## 2021-08-28 DIAGNOSIS — M25642 Stiffness of left hand, not elsewhere classified: Secondary | ICD-10-CM | POA: Diagnosis not present

## 2021-08-28 DIAGNOSIS — M79643 Pain in unspecified hand: Secondary | ICD-10-CM | POA: Diagnosis not present

## 2021-08-28 DIAGNOSIS — M79646 Pain in unspecified finger(s): Secondary | ICD-10-CM | POA: Diagnosis not present

## 2021-08-28 DIAGNOSIS — R208 Other disturbances of skin sensation: Secondary | ICD-10-CM | POA: Diagnosis not present

## 2021-08-28 DIAGNOSIS — M3489 Other systemic sclerosis: Secondary | ICD-10-CM | POA: Diagnosis not present

## 2021-08-28 DIAGNOSIS — M25641 Stiffness of right hand, not elsewhere classified: Secondary | ICD-10-CM | POA: Diagnosis not present

## 2021-08-29 DIAGNOSIS — N3021 Other chronic cystitis with hematuria: Secondary | ICD-10-CM | POA: Diagnosis not present

## 2021-08-29 DIAGNOSIS — M349 Systemic sclerosis, unspecified: Secondary | ICD-10-CM | POA: Diagnosis not present

## 2021-08-29 DIAGNOSIS — R102 Pelvic and perineal pain: Secondary | ICD-10-CM | POA: Diagnosis not present

## 2021-08-29 DIAGNOSIS — R3982 Chronic bladder pain: Secondary | ICD-10-CM | POA: Diagnosis not present

## 2021-08-29 DIAGNOSIS — M62838 Other muscle spasm: Secondary | ICD-10-CM | POA: Diagnosis not present

## 2021-08-29 DIAGNOSIS — M25552 Pain in left hip: Secondary | ICD-10-CM | POA: Diagnosis not present

## 2021-08-29 DIAGNOSIS — M6281 Muscle weakness (generalized): Secondary | ICD-10-CM | POA: Diagnosis not present

## 2021-08-31 ENCOUNTER — Ambulatory Visit: Payer: Medicare Other | Admitting: Physician Assistant

## 2021-09-03 DIAGNOSIS — M25552 Pain in left hip: Secondary | ICD-10-CM | POA: Diagnosis not present

## 2021-09-03 DIAGNOSIS — R3982 Chronic bladder pain: Secondary | ICD-10-CM | POA: Diagnosis not present

## 2021-09-03 DIAGNOSIS — N3021 Other chronic cystitis with hematuria: Secondary | ICD-10-CM | POA: Diagnosis not present

## 2021-09-03 DIAGNOSIS — M6281 Muscle weakness (generalized): Secondary | ICD-10-CM | POA: Diagnosis not present

## 2021-09-03 DIAGNOSIS — M6289 Other specified disorders of muscle: Secondary | ICD-10-CM | POA: Diagnosis not present

## 2021-09-03 DIAGNOSIS — R102 Pelvic and perineal pain: Secondary | ICD-10-CM | POA: Diagnosis not present

## 2021-09-03 DIAGNOSIS — M349 Systemic sclerosis, unspecified: Secondary | ICD-10-CM | POA: Diagnosis not present

## 2021-09-03 DIAGNOSIS — M62838 Other muscle spasm: Secondary | ICD-10-CM | POA: Diagnosis not present

## 2021-09-10 DIAGNOSIS — B029 Zoster without complications: Secondary | ICD-10-CM | POA: Diagnosis not present

## 2021-09-10 DIAGNOSIS — Z85828 Personal history of other malignant neoplasm of skin: Secondary | ICD-10-CM | POA: Diagnosis not present

## 2021-09-17 DIAGNOSIS — M25552 Pain in left hip: Secondary | ICD-10-CM | POA: Diagnosis not present

## 2021-09-17 DIAGNOSIS — M6281 Muscle weakness (generalized): Secondary | ICD-10-CM | POA: Diagnosis not present

## 2021-09-17 DIAGNOSIS — M62838 Other muscle spasm: Secondary | ICD-10-CM | POA: Diagnosis not present

## 2021-09-17 DIAGNOSIS — R3982 Chronic bladder pain: Secondary | ICD-10-CM | POA: Diagnosis not present

## 2021-09-17 DIAGNOSIS — M6289 Other specified disorders of muscle: Secondary | ICD-10-CM | POA: Diagnosis not present

## 2021-09-17 DIAGNOSIS — N3021 Other chronic cystitis with hematuria: Secondary | ICD-10-CM | POA: Diagnosis not present

## 2021-10-01 DIAGNOSIS — M25552 Pain in left hip: Secondary | ICD-10-CM | POA: Diagnosis not present

## 2021-10-01 DIAGNOSIS — M6281 Muscle weakness (generalized): Secondary | ICD-10-CM | POA: Diagnosis not present

## 2021-10-01 DIAGNOSIS — R102 Pelvic and perineal pain: Secondary | ICD-10-CM | POA: Diagnosis not present

## 2021-10-01 DIAGNOSIS — R3982 Chronic bladder pain: Secondary | ICD-10-CM | POA: Diagnosis not present

## 2021-10-01 DIAGNOSIS — N3021 Other chronic cystitis with hematuria: Secondary | ICD-10-CM | POA: Diagnosis not present

## 2021-10-09 DIAGNOSIS — Z85828 Personal history of other malignant neoplasm of skin: Secondary | ICD-10-CM | POA: Diagnosis not present

## 2021-10-09 DIAGNOSIS — B029 Zoster without complications: Secondary | ICD-10-CM | POA: Diagnosis not present

## 2021-10-15 DIAGNOSIS — R3982 Chronic bladder pain: Secondary | ICD-10-CM | POA: Diagnosis not present

## 2021-10-15 DIAGNOSIS — M62838 Other muscle spasm: Secondary | ICD-10-CM | POA: Diagnosis not present

## 2021-10-15 DIAGNOSIS — N3021 Other chronic cystitis with hematuria: Secondary | ICD-10-CM | POA: Diagnosis not present

## 2021-10-15 DIAGNOSIS — M6289 Other specified disorders of muscle: Secondary | ICD-10-CM | POA: Diagnosis not present

## 2021-10-15 DIAGNOSIS — R102 Pelvic and perineal pain: Secondary | ICD-10-CM | POA: Diagnosis not present

## 2021-10-15 DIAGNOSIS — M349 Systemic sclerosis, unspecified: Secondary | ICD-10-CM | POA: Diagnosis not present

## 2021-10-15 DIAGNOSIS — M6281 Muscle weakness (generalized): Secondary | ICD-10-CM | POA: Diagnosis not present

## 2021-10-15 DIAGNOSIS — M25552 Pain in left hip: Secondary | ICD-10-CM | POA: Diagnosis not present

## 2021-10-16 DIAGNOSIS — M3481 Systemic sclerosis with lung involvement: Secondary | ICD-10-CM | POA: Diagnosis not present

## 2021-10-16 DIAGNOSIS — M3502 Sicca syndrome with lung involvement: Secondary | ICD-10-CM | POA: Diagnosis not present

## 2021-10-16 DIAGNOSIS — M349 Systemic sclerosis, unspecified: Secondary | ICD-10-CM | POA: Diagnosis not present

## 2021-10-16 DIAGNOSIS — E039 Hypothyroidism, unspecified: Secondary | ICD-10-CM | POA: Diagnosis not present

## 2021-10-18 DIAGNOSIS — H524 Presbyopia: Secondary | ICD-10-CM | POA: Diagnosis not present

## 2021-10-18 DIAGNOSIS — H2513 Age-related nuclear cataract, bilateral: Secondary | ICD-10-CM | POA: Diagnosis not present

## 2021-10-18 DIAGNOSIS — H52223 Regular astigmatism, bilateral: Secondary | ICD-10-CM | POA: Diagnosis not present

## 2021-10-18 DIAGNOSIS — H5203 Hypermetropia, bilateral: Secondary | ICD-10-CM | POA: Diagnosis not present

## 2021-10-23 DIAGNOSIS — M79643 Pain in unspecified hand: Secondary | ICD-10-CM | POA: Diagnosis not present

## 2021-10-23 DIAGNOSIS — M25641 Stiffness of right hand, not elsewhere classified: Secondary | ICD-10-CM | POA: Diagnosis not present

## 2021-10-23 DIAGNOSIS — M79646 Pain in unspecified finger(s): Secondary | ICD-10-CM | POA: Diagnosis not present

## 2021-10-23 DIAGNOSIS — R29898 Other symptoms and signs involving the musculoskeletal system: Secondary | ICD-10-CM | POA: Diagnosis not present

## 2021-10-23 DIAGNOSIS — M3489 Other systemic sclerosis: Secondary | ICD-10-CM | POA: Diagnosis not present

## 2021-10-23 DIAGNOSIS — R208 Other disturbances of skin sensation: Secondary | ICD-10-CM | POA: Diagnosis not present

## 2021-10-23 DIAGNOSIS — M25642 Stiffness of left hand, not elsewhere classified: Secondary | ICD-10-CM | POA: Diagnosis not present

## 2021-10-23 DIAGNOSIS — M349 Systemic sclerosis, unspecified: Secondary | ICD-10-CM | POA: Diagnosis not present

## 2021-10-26 DIAGNOSIS — R030 Elevated blood-pressure reading, without diagnosis of hypertension: Secondary | ICD-10-CM | POA: Diagnosis not present

## 2021-10-26 DIAGNOSIS — C50919 Malignant neoplasm of unspecified site of unspecified female breast: Secondary | ICD-10-CM | POA: Diagnosis not present

## 2021-10-26 DIAGNOSIS — E039 Hypothyroidism, unspecified: Secondary | ICD-10-CM | POA: Diagnosis not present

## 2021-10-26 DIAGNOSIS — M349 Systemic sclerosis, unspecified: Secondary | ICD-10-CM | POA: Diagnosis not present

## 2021-10-26 DIAGNOSIS — Z8619 Personal history of other infectious and parasitic diseases: Secondary | ICD-10-CM | POA: Diagnosis not present

## 2021-10-29 DIAGNOSIS — R3982 Chronic bladder pain: Secondary | ICD-10-CM | POA: Diagnosis not present

## 2021-10-29 DIAGNOSIS — M62838 Other muscle spasm: Secondary | ICD-10-CM | POA: Diagnosis not present

## 2021-10-29 DIAGNOSIS — M25552 Pain in left hip: Secondary | ICD-10-CM | POA: Diagnosis not present

## 2021-10-29 DIAGNOSIS — M6281 Muscle weakness (generalized): Secondary | ICD-10-CM | POA: Diagnosis not present

## 2021-10-29 DIAGNOSIS — M349 Systemic sclerosis, unspecified: Secondary | ICD-10-CM | POA: Diagnosis not present

## 2021-10-29 DIAGNOSIS — R102 Pelvic and perineal pain: Secondary | ICD-10-CM | POA: Diagnosis not present

## 2021-10-29 DIAGNOSIS — M6289 Other specified disorders of muscle: Secondary | ICD-10-CM | POA: Diagnosis not present

## 2021-10-29 DIAGNOSIS — N3021 Other chronic cystitis with hematuria: Secondary | ICD-10-CM | POA: Diagnosis not present

## 2021-11-06 DIAGNOSIS — R29898 Other symptoms and signs involving the musculoskeletal system: Secondary | ICD-10-CM | POA: Diagnosis not present

## 2021-11-06 DIAGNOSIS — M349 Systemic sclerosis, unspecified: Secondary | ICD-10-CM | POA: Diagnosis not present

## 2021-11-06 DIAGNOSIS — M79646 Pain in unspecified finger(s): Secondary | ICD-10-CM | POA: Diagnosis not present

## 2021-11-06 DIAGNOSIS — R208 Other disturbances of skin sensation: Secondary | ICD-10-CM | POA: Diagnosis not present

## 2021-11-06 DIAGNOSIS — M79643 Pain in unspecified hand: Secondary | ICD-10-CM | POA: Diagnosis not present

## 2021-11-06 DIAGNOSIS — M25641 Stiffness of right hand, not elsewhere classified: Secondary | ICD-10-CM | POA: Diagnosis not present

## 2021-11-06 DIAGNOSIS — M25642 Stiffness of left hand, not elsewhere classified: Secondary | ICD-10-CM | POA: Diagnosis not present

## 2021-11-06 DIAGNOSIS — M3489 Other systemic sclerosis: Secondary | ICD-10-CM | POA: Diagnosis not present

## 2021-11-12 DIAGNOSIS — N3021 Other chronic cystitis with hematuria: Secondary | ICD-10-CM | POA: Diagnosis not present

## 2021-11-12 DIAGNOSIS — R3982 Chronic bladder pain: Secondary | ICD-10-CM | POA: Diagnosis not present

## 2021-11-12 DIAGNOSIS — M6289 Other specified disorders of muscle: Secondary | ICD-10-CM | POA: Diagnosis not present

## 2021-11-12 DIAGNOSIS — M62838 Other muscle spasm: Secondary | ICD-10-CM | POA: Diagnosis not present

## 2021-11-12 DIAGNOSIS — M25552 Pain in left hip: Secondary | ICD-10-CM | POA: Diagnosis not present

## 2021-11-12 DIAGNOSIS — M6281 Muscle weakness (generalized): Secondary | ICD-10-CM | POA: Diagnosis not present

## 2021-11-19 DIAGNOSIS — M79643 Pain in unspecified hand: Secondary | ICD-10-CM | POA: Diagnosis not present

## 2021-11-19 DIAGNOSIS — M25641 Stiffness of right hand, not elsewhere classified: Secondary | ICD-10-CM | POA: Diagnosis not present

## 2021-11-19 DIAGNOSIS — R29898 Other symptoms and signs involving the musculoskeletal system: Secondary | ICD-10-CM | POA: Diagnosis not present

## 2021-11-19 DIAGNOSIS — M349 Systemic sclerosis, unspecified: Secondary | ICD-10-CM | POA: Diagnosis not present

## 2021-11-19 DIAGNOSIS — R208 Other disturbances of skin sensation: Secondary | ICD-10-CM | POA: Diagnosis not present

## 2021-11-19 DIAGNOSIS — M3489 Other systemic sclerosis: Secondary | ICD-10-CM | POA: Diagnosis not present

## 2021-11-19 DIAGNOSIS — M25642 Stiffness of left hand, not elsewhere classified: Secondary | ICD-10-CM | POA: Diagnosis not present

## 2021-11-19 DIAGNOSIS — M79646 Pain in unspecified finger(s): Secondary | ICD-10-CM | POA: Diagnosis not present

## 2021-12-03 DIAGNOSIS — M25642 Stiffness of left hand, not elsewhere classified: Secondary | ICD-10-CM | POA: Diagnosis not present

## 2021-12-03 DIAGNOSIS — R29898 Other symptoms and signs involving the musculoskeletal system: Secondary | ICD-10-CM | POA: Diagnosis not present

## 2021-12-03 DIAGNOSIS — R208 Other disturbances of skin sensation: Secondary | ICD-10-CM | POA: Diagnosis not present

## 2021-12-03 DIAGNOSIS — M349 Systemic sclerosis, unspecified: Secondary | ICD-10-CM | POA: Diagnosis not present

## 2021-12-03 DIAGNOSIS — M79646 Pain in unspecified finger(s): Secondary | ICD-10-CM | POA: Diagnosis not present

## 2021-12-03 DIAGNOSIS — M25641 Stiffness of right hand, not elsewhere classified: Secondary | ICD-10-CM | POA: Diagnosis not present

## 2021-12-03 DIAGNOSIS — M3489 Other systemic sclerosis: Secondary | ICD-10-CM | POA: Diagnosis not present

## 2021-12-03 DIAGNOSIS — M79643 Pain in unspecified hand: Secondary | ICD-10-CM | POA: Diagnosis not present

## 2021-12-13 ENCOUNTER — Other Ambulatory Visit: Payer: Self-pay

## 2021-12-13 DIAGNOSIS — Z170 Estrogen receptor positive status [ER+]: Secondary | ICD-10-CM

## 2021-12-14 DIAGNOSIS — M6289 Other specified disorders of muscle: Secondary | ICD-10-CM | POA: Diagnosis not present

## 2021-12-14 DIAGNOSIS — M62838 Other muscle spasm: Secondary | ICD-10-CM | POA: Diagnosis not present

## 2021-12-14 DIAGNOSIS — M6281 Muscle weakness (generalized): Secondary | ICD-10-CM | POA: Diagnosis not present

## 2021-12-14 DIAGNOSIS — R102 Pelvic and perineal pain: Secondary | ICD-10-CM | POA: Diagnosis not present

## 2021-12-14 DIAGNOSIS — M25552 Pain in left hip: Secondary | ICD-10-CM | POA: Diagnosis not present

## 2021-12-24 DIAGNOSIS — M62838 Other muscle spasm: Secondary | ICD-10-CM | POA: Diagnosis not present

## 2021-12-24 DIAGNOSIS — M349 Systemic sclerosis, unspecified: Secondary | ICD-10-CM | POA: Diagnosis not present

## 2021-12-24 DIAGNOSIS — N3021 Other chronic cystitis with hematuria: Secondary | ICD-10-CM | POA: Diagnosis not present

## 2021-12-24 DIAGNOSIS — M6289 Other specified disorders of muscle: Secondary | ICD-10-CM | POA: Diagnosis not present

## 2021-12-24 DIAGNOSIS — M6281 Muscle weakness (generalized): Secondary | ICD-10-CM | POA: Diagnosis not present

## 2021-12-24 DIAGNOSIS — M25552 Pain in left hip: Secondary | ICD-10-CM | POA: Diagnosis not present

## 2021-12-24 DIAGNOSIS — R102 Pelvic and perineal pain: Secondary | ICD-10-CM | POA: Diagnosis not present

## 2021-12-24 DIAGNOSIS — R3982 Chronic bladder pain: Secondary | ICD-10-CM | POA: Diagnosis not present

## 2022-01-11 DIAGNOSIS — M6289 Other specified disorders of muscle: Secondary | ICD-10-CM | POA: Diagnosis not present

## 2022-01-11 DIAGNOSIS — R3982 Chronic bladder pain: Secondary | ICD-10-CM | POA: Diagnosis not present

## 2022-01-11 DIAGNOSIS — M25552 Pain in left hip: Secondary | ICD-10-CM | POA: Diagnosis not present

## 2022-01-11 DIAGNOSIS — M6281 Muscle weakness (generalized): Secondary | ICD-10-CM | POA: Diagnosis not present

## 2022-01-11 DIAGNOSIS — M62838 Other muscle spasm: Secondary | ICD-10-CM | POA: Diagnosis not present

## 2022-01-11 DIAGNOSIS — R102 Pelvic and perineal pain: Secondary | ICD-10-CM | POA: Diagnosis not present

## 2022-01-11 DIAGNOSIS — N3021 Other chronic cystitis with hematuria: Secondary | ICD-10-CM | POA: Diagnosis not present

## 2022-01-22 DIAGNOSIS — M349 Systemic sclerosis, unspecified: Secondary | ICD-10-CM | POA: Diagnosis not present

## 2022-01-22 DIAGNOSIS — R29898 Other symptoms and signs involving the musculoskeletal system: Secondary | ICD-10-CM | POA: Diagnosis not present

## 2022-01-22 DIAGNOSIS — M79646 Pain in unspecified finger(s): Secondary | ICD-10-CM | POA: Diagnosis not present

## 2022-01-22 DIAGNOSIS — M79643 Pain in unspecified hand: Secondary | ICD-10-CM | POA: Diagnosis not present

## 2022-01-22 DIAGNOSIS — M25641 Stiffness of right hand, not elsewhere classified: Secondary | ICD-10-CM | POA: Diagnosis not present

## 2022-01-22 DIAGNOSIS — M3489 Other systemic sclerosis: Secondary | ICD-10-CM | POA: Diagnosis not present

## 2022-01-22 DIAGNOSIS — M25642 Stiffness of left hand, not elsewhere classified: Secondary | ICD-10-CM | POA: Diagnosis not present

## 2022-01-22 DIAGNOSIS — R208 Other disturbances of skin sensation: Secondary | ICD-10-CM | POA: Diagnosis not present

## 2022-01-24 DIAGNOSIS — E7800 Pure hypercholesterolemia, unspecified: Secondary | ICD-10-CM | POA: Diagnosis not present

## 2022-01-24 DIAGNOSIS — E039 Hypothyroidism, unspecified: Secondary | ICD-10-CM | POA: Diagnosis not present

## 2022-01-24 DIAGNOSIS — I7300 Raynaud's syndrome without gangrene: Secondary | ICD-10-CM | POA: Diagnosis not present

## 2022-01-24 DIAGNOSIS — M349 Systemic sclerosis, unspecified: Secondary | ICD-10-CM | POA: Diagnosis not present

## 2022-01-25 DIAGNOSIS — M25552 Pain in left hip: Secondary | ICD-10-CM | POA: Diagnosis not present

## 2022-01-25 DIAGNOSIS — M6281 Muscle weakness (generalized): Secondary | ICD-10-CM | POA: Diagnosis not present

## 2022-01-25 DIAGNOSIS — N3021 Other chronic cystitis with hematuria: Secondary | ICD-10-CM | POA: Diagnosis not present

## 2022-01-25 DIAGNOSIS — M62838 Other muscle spasm: Secondary | ICD-10-CM | POA: Diagnosis not present

## 2022-01-25 DIAGNOSIS — R3982 Chronic bladder pain: Secondary | ICD-10-CM | POA: Diagnosis not present

## 2022-01-25 DIAGNOSIS — R102 Pelvic and perineal pain: Secondary | ICD-10-CM | POA: Diagnosis not present

## 2022-01-29 DIAGNOSIS — M25641 Stiffness of right hand, not elsewhere classified: Secondary | ICD-10-CM | POA: Diagnosis not present

## 2022-01-29 DIAGNOSIS — M25642 Stiffness of left hand, not elsewhere classified: Secondary | ICD-10-CM | POA: Diagnosis not present

## 2022-01-29 DIAGNOSIS — M79643 Pain in unspecified hand: Secondary | ICD-10-CM | POA: Diagnosis not present

## 2022-01-29 DIAGNOSIS — M349 Systemic sclerosis, unspecified: Secondary | ICD-10-CM | POA: Diagnosis not present

## 2022-01-29 DIAGNOSIS — R208 Other disturbances of skin sensation: Secondary | ICD-10-CM | POA: Diagnosis not present

## 2022-01-29 DIAGNOSIS — R29898 Other symptoms and signs involving the musculoskeletal system: Secondary | ICD-10-CM | POA: Diagnosis not present

## 2022-01-29 DIAGNOSIS — M79646 Pain in unspecified finger(s): Secondary | ICD-10-CM | POA: Diagnosis not present

## 2022-01-29 DIAGNOSIS — M3489 Other systemic sclerosis: Secondary | ICD-10-CM | POA: Diagnosis not present

## 2022-02-01 ENCOUNTER — Other Ambulatory Visit: Payer: Medicare Other

## 2022-02-01 ENCOUNTER — Ambulatory Visit
Admission: RE | Admit: 2022-02-01 | Discharge: 2022-02-01 | Disposition: A | Discharge status: Home / Self Care | Payer: Medicare Other | Source: Ambulatory Visit | Attending: Hematology and Oncology | Admitting: Hematology and Oncology

## 2022-02-01 DIAGNOSIS — Z170 Estrogen receptor positive status [ER+]: Secondary | ICD-10-CM

## 2022-02-01 DIAGNOSIS — Z853 Personal history of malignant neoplasm of breast: Secondary | ICD-10-CM | POA: Diagnosis not present

## 2022-02-01 MED ORDER — GADOBUTROL 1 MMOL/ML IV SOLN
6.0000 mL | Freq: Once | INTRAVENOUS | Status: AC | PRN
  Administered 2022-02-01: 6 mL via INTRAVENOUS

## 2022-02-04 DIAGNOSIS — M6281 Muscle weakness (generalized): Secondary | ICD-10-CM | POA: Diagnosis not present

## 2022-02-04 DIAGNOSIS — M62838 Other muscle spasm: Secondary | ICD-10-CM | POA: Diagnosis not present

## 2022-02-04 DIAGNOSIS — M349 Systemic sclerosis, unspecified: Secondary | ICD-10-CM | POA: Diagnosis not present

## 2022-02-04 DIAGNOSIS — R102 Pelvic and perineal pain: Secondary | ICD-10-CM | POA: Diagnosis not present

## 2022-02-04 DIAGNOSIS — M6289 Other specified disorders of muscle: Secondary | ICD-10-CM | POA: Diagnosis not present

## 2022-02-04 DIAGNOSIS — M25552 Pain in left hip: Secondary | ICD-10-CM | POA: Diagnosis not present

## 2022-02-06 ENCOUNTER — Other Ambulatory Visit: Payer: Self-pay | Admitting: Hematology and Oncology

## 2022-02-06 DIAGNOSIS — R9389 Abnormal findings on diagnostic imaging of other specified body structures: Secondary | ICD-10-CM

## 2022-02-07 ENCOUNTER — Telehealth (HOSPITAL_BASED_OUTPATIENT_CLINIC_OR_DEPARTMENT_OTHER): Payer: Self-pay | Admitting: Obstetrics & Gynecology

## 2022-02-07 ENCOUNTER — Other Ambulatory Visit: Payer: Self-pay | Admitting: *Deleted

## 2022-02-07 ENCOUNTER — Telehealth: Payer: Self-pay | Admitting: *Deleted

## 2022-02-07 DIAGNOSIS — N6310 Unspecified lump in the right breast, unspecified quadrant: Secondary | ICD-10-CM

## 2022-02-07 DIAGNOSIS — C50412 Malignant neoplasm of upper-outer quadrant of left female breast: Secondary | ICD-10-CM

## 2022-02-07 NOTE — Telephone Encounter (Signed)
Called pt in response to her my chart message.  Advised I've reached out to Dr. [name] to see if she can transfer her oncology care to him.  I, personally, think we should consider how to do a biopsy.  Pt and I discussed seeing a Education officer, environmental.  She's been given Dr. Cristal Generous name.  Will reach out to him as well and will get back with Mrs. Kennard.  Pt voiced appreciation.

## 2022-02-07 NOTE — Telephone Encounter (Signed)
Received result of MRI with need for further evaluation-   Of note MRI of breast are done due to pt's known scleroderma and need for avoidance of radiation exposure.  Pt was last seen by Dr [name] last year- who ordered her next MRI for this year but also released her from follow up.  Dr [name] has since retired.  [facility] notified this office that new MRI order needed per Dr Virgie Dad retirement- an order was replaced under Dr Chryl Heck ( not by this RN).  MRI has now come back with noted area of abnormality requiring further follow up.  Dr Chryl Heck placed appropriate orders.  Per discussion with the pt by this RN regarding results and needed follow up- she states she is reluctant to proceed to biopsy under MRI due to further radiation exposure as well as she states she has allergy it lidocaine ( not on med list ) as well as epinephrine and CT contrast. She is inquiring if there is another route for further follow up. She also states if follow up needed in this office she would prefer to see Dr Lindi Adie ( per her inquiry with other MD's ) or Dr [name].  Per discussion with MD and breast navigators- pt may benefit from discussion with surgeon regarding proceeding with excisional biopsy.  Referral placed for surgical consultation and [place] Surgeon office to schedule appointment.  This RN called above and spoke with Catalina Lunger.  Next available is with Dr [name] on 03/04/2022 at 340 pm arrive at 310. Pt will put on the cancellation list.  This note will be sent to Md and above surgeon for communication.

## 2022-02-12 DIAGNOSIS — M3489 Other systemic sclerosis: Secondary | ICD-10-CM | POA: Diagnosis not present

## 2022-02-12 DIAGNOSIS — M79646 Pain in unspecified finger(s): Secondary | ICD-10-CM | POA: Diagnosis not present

## 2022-02-12 DIAGNOSIS — M25641 Stiffness of right hand, not elsewhere classified: Secondary | ICD-10-CM | POA: Diagnosis not present

## 2022-02-12 DIAGNOSIS — M349 Systemic sclerosis, unspecified: Secondary | ICD-10-CM | POA: Diagnosis not present

## 2022-02-12 DIAGNOSIS — R208 Other disturbances of skin sensation: Secondary | ICD-10-CM | POA: Diagnosis not present

## 2022-02-12 DIAGNOSIS — M79643 Pain in unspecified hand: Secondary | ICD-10-CM | POA: Diagnosis not present

## 2022-02-12 DIAGNOSIS — R29898 Other symptoms and signs involving the musculoskeletal system: Secondary | ICD-10-CM | POA: Diagnosis not present

## 2022-02-12 DIAGNOSIS — M25642 Stiffness of left hand, not elsewhere classified: Secondary | ICD-10-CM | POA: Diagnosis not present

## 2022-02-12 DIAGNOSIS — R600 Localized edema: Secondary | ICD-10-CM | POA: Diagnosis not present

## 2022-02-18 DIAGNOSIS — R102 Pelvic and perineal pain: Secondary | ICD-10-CM | POA: Diagnosis not present

## 2022-02-18 DIAGNOSIS — M6281 Muscle weakness (generalized): Secondary | ICD-10-CM | POA: Diagnosis not present

## 2022-02-18 DIAGNOSIS — M62838 Other muscle spasm: Secondary | ICD-10-CM | POA: Diagnosis not present

## 2022-02-18 DIAGNOSIS — N3021 Other chronic cystitis with hematuria: Secondary | ICD-10-CM | POA: Diagnosis not present

## 2022-02-18 DIAGNOSIS — M6289 Other specified disorders of muscle: Secondary | ICD-10-CM | POA: Diagnosis not present

## 2022-02-18 DIAGNOSIS — M25552 Pain in left hip: Secondary | ICD-10-CM | POA: Diagnosis not present

## 2022-02-20 ENCOUNTER — Encounter: Payer: Self-pay | Admitting: Hematology & Oncology

## 2022-02-20 ENCOUNTER — Other Ambulatory Visit: Payer: Self-pay

## 2022-02-20 ENCOUNTER — Inpatient Hospital Stay: Payer: Medicare Other | Attending: Hematology & Oncology

## 2022-02-20 ENCOUNTER — Encounter: Payer: Self-pay | Admitting: *Deleted

## 2022-02-20 ENCOUNTER — Inpatient Hospital Stay (HOSPITAL_BASED_OUTPATIENT_CLINIC_OR_DEPARTMENT_OTHER): Payer: Medicare Other | Admitting: Hematology & Oncology

## 2022-02-20 VITALS — BP 147/71 | HR 75 | Temp 98.5°F | Resp 18 | Ht 64.0 in | Wt 160.0 lb

## 2022-02-20 DIAGNOSIS — K219 Gastro-esophageal reflux disease without esophagitis: Secondary | ICD-10-CM | POA: Insufficient documentation

## 2022-02-20 DIAGNOSIS — E785 Hyperlipidemia, unspecified: Secondary | ICD-10-CM | POA: Diagnosis not present

## 2022-02-20 DIAGNOSIS — Z79890 Hormone replacement therapy (postmenopausal): Secondary | ICD-10-CM | POA: Diagnosis not present

## 2022-02-20 DIAGNOSIS — Z86010 Personal history of colonic polyps: Secondary | ICD-10-CM | POA: Diagnosis not present

## 2022-02-20 DIAGNOSIS — K222 Esophageal obstruction: Secondary | ICD-10-CM | POA: Insufficient documentation

## 2022-02-20 DIAGNOSIS — E039 Hypothyroidism, unspecified: Secondary | ICD-10-CM | POA: Diagnosis not present

## 2022-02-20 DIAGNOSIS — C50412 Malignant neoplasm of upper-outer quadrant of left female breast: Secondary | ICD-10-CM

## 2022-02-20 DIAGNOSIS — Z79899 Other long term (current) drug therapy: Secondary | ICD-10-CM | POA: Diagnosis not present

## 2022-02-20 DIAGNOSIS — M1990 Unspecified osteoarthritis, unspecified site: Secondary | ICD-10-CM | POA: Diagnosis not present

## 2022-02-20 DIAGNOSIS — K449 Diaphragmatic hernia without obstruction or gangrene: Secondary | ICD-10-CM | POA: Insufficient documentation

## 2022-02-20 DIAGNOSIS — Z853 Personal history of malignant neoplasm of breast: Secondary | ICD-10-CM | POA: Diagnosis not present

## 2022-02-20 DIAGNOSIS — M349 Systemic sclerosis, unspecified: Secondary | ICD-10-CM | POA: Insufficient documentation

## 2022-02-20 DIAGNOSIS — Z170 Estrogen receptor positive status [ER+]: Secondary | ICD-10-CM | POA: Diagnosis not present

## 2022-02-20 LAB — CBC WITH DIFFERENTIAL (CANCER CENTER ONLY)
Abs Immature Granulocytes: 0.05 10*3/uL (ref 0.00–0.07)
Basophils Absolute: 0.1 10*3/uL (ref 0.0–0.1)
Basophils Relative: 1 %
Eosinophils Absolute: 0.3 10*3/uL (ref 0.0–0.5)
Eosinophils Relative: 5 %
HCT: 40.6 % (ref 36.0–46.0)
Hemoglobin: 13 g/dL (ref 12.0–15.0)
Immature Granulocytes: 1 %
Lymphocytes Relative: 11 %
Lymphs Abs: 0.8 10*3/uL (ref 0.7–4.0)
MCH: 28.6 pg (ref 26.0–34.0)
MCHC: 32 g/dL (ref 30.0–36.0)
MCV: 89.4 fL (ref 80.0–100.0)
Monocytes Absolute: 0.7 10*3/uL (ref 0.1–1.0)
Monocytes Relative: 10 %
Neutro Abs: 5.6 10*3/uL (ref 1.7–7.7)
Neutrophils Relative %: 72 %
Platelet Count: 197 10*3/uL (ref 150–400)
RBC: 4.54 MIL/uL (ref 3.87–5.11)
RDW: 14.8 % (ref 11.5–15.5)
WBC Count: 7.6 10*3/uL (ref 4.0–10.5)
nRBC: 0 % (ref 0.0–0.2)

## 2022-02-20 LAB — LACTATE DEHYDROGENASE: LDH: 249 U/L — ABNORMAL HIGH (ref 98–192)

## 2022-02-20 LAB — CMP (CANCER CENTER ONLY)
ALT: 18 U/L (ref 0–44)
AST: 28 U/L (ref 15–41)
Albumin: 4.2 g/dL (ref 3.5–5.0)
Alkaline Phosphatase: 64 U/L (ref 38–126)
Anion gap: 7 (ref 5–15)
BUN: 12 mg/dL (ref 8–23)
CO2: 28 mmol/L (ref 22–32)
Calcium: 9.9 mg/dL (ref 8.9–10.3)
Chloride: 103 mmol/L (ref 98–111)
Creatinine: 1.1 mg/dL — ABNORMAL HIGH (ref 0.44–1.00)
GFR, Estimated: 53 mL/min — ABNORMAL LOW (ref >60)
Glucose, Bld: 104 mg/dL — ABNORMAL HIGH (ref 70–99)
Potassium: 4.6 mmol/L (ref 3.5–5.1)
Sodium: 138 mmol/L (ref 135–145)
Total Bilirubin: 0.5 mg/dL (ref 0.3–1.2)
Total Protein: 6.3 g/dL — ABNORMAL LOW (ref 6.5–8.1)

## 2022-02-20 NOTE — Progress Notes (Signed)
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  HPI: Ms. [name] is a very charming 71 year old white female.  She has a interesting history.  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  She has been seen by Dr. [name].  She underwent a lobectomy back in 2013.  The pathology report ([id number]) showed an invasive ductal carcinoma that was grade 1.  It was 3.2 cm in size.  There are 3 negative lymph nodes.  It was estrogen receptor positive.  It was HER2 negative.  She underwent postoperative radiation therapy.  She was placed on tamoxifen.  She completed tamoxifen in August 2021.  She has been diagnosed with scleroderma.  She sees a specialist up in [place] at [facility].  Because of the scleroderma, it was felt that she needed to avoid any radiation treatments.  As such, she is getting mammograms for her breast follow-up.  She thankfully, has not had systemic organ involvement by the scleroderma today.  She has had no scleroderma "crises."  She had a mammogram was done on 02/01/2022.  This showed a new area of non-mass enhancement in the retroareolar right breast.  This measured 2.8 cm.  Because of this, there is concern as to whether or not she had a second breast cancer.  She saw her gynecologist, Dr. Sabra Heck.  Dr. Sabra Heck, referred her to the [facility].  She has appointment to see Dr. [name] of Surgical Oncology.  She has had no swelling of the right breast.  There is been no nipple discharge or bleeding.  There is been no swelling in the right axilla.  Currently, I would say performance status is quite good and ECOG 1.   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 Ruthann Cancer at [facility]   systemic sclerosis , diffuse/   rheumotologist:  dr Manuella Ghazi at [facility]---  lov 07-22-2017 (as of 07-25-2017 note not available to read)---  per pt has appt. w/ specialist at [facility] thickening    hands, arms, legs  caused by scleroderma   Systemic sclerosis ([place])    UTI (urinary tract infection)   :   Past Surgical  History:  Procedure Laterality Date   BIOPSY  01/29/2018   Procedure: BIOPSY;  Surgeon: Jerene Bears, MD;  Location: [facility];  Service: Gastroenterology;;   Lillard Anes  2000   Left   COLONOSCOPY WITH PROPOFOL N/A 01/29/2018   Procedure: COLONOSCOPY WITH PROPOFOL;  Surgeon: Jerene Bears, MD;  Location: [facility];  Service: Gastroenterology;  Laterality: N/A;   DILATATION & CURETTAGE/HYSTEROSCOPY WITH MYOSURE N/A 08/04/2017   Procedure: DILATATION & CURETTAGE/HYSTEROSCOPY WITH MYOSURE;  Surgeon: Megan Salon, MD;  Location: [facility];  Service: Gynecology;  Laterality: N/A;   ECTOPIC PREGNANCY SURGERY  1986-88   s/p unilateral salpingectomy    ESOPHAGOGASTRODUODENOSCOPY (EGD) WITH PROPOFOL N/A 01/29/2018   Procedure: ESOPHAGOGASTRODUODENOSCOPY (EGD) WITH PROPOFOL;  Surgeon: Jerene Bears, MD;  Location: [facility];  Service: Gastroenterology;  Laterality: N/A;   PARTIAL MASTECTOMY WITH AXILLARY SENTINEL LYMPH NODE BIOPSY Left 11-04-2011   dr Margot Chimes  [facility]   TRANSTHORACIC ECHOCARDIOGRAM  06-04-2017    [facility]   moderate LVH, ef>55%/  trivial AR and TR/ mild MR and PR/  trivial pericardial effusion   WISDOM TOOTH EXTRACTION    :   Current Outpatient Medications:    acetaminophen (TYLENOL) 650 MG CR tablet, Take 650 mg by mouth as needed., Disp: , Rfl:    ELDERBERRY PO, Take by mouth 2 (two) times daily., Disp: , Rfl:    furosemide (LASIX) 20 MG tablet, Take 20 mg by mouth daily., Disp: , Rfl:    levothyroxine (SYNTHROID) 88 MCG tablet, Take 88 mcg by mouth daily., Disp: , Rfl:    OVER THE COUNTER MEDICATION, Relief Factor, Disp: , Rfl: :  :   Allergies  Allergen Reactions   Betadine [Povidone Iodine] Rash   Contrast Media [Iodinated Contrast Media] Rash    "per pt recently had CT 08/ 2018 even through given pre-medication, IVP still caused rash"   Epinephrine Other (See Comments)    Sever headaches   Oxycodone Anxiety   Lidocaine Rash   Nickel Rash and Other (See Comments)    Rash and blisters   Other Rash and Other (See Comments)    Hospital gown   Penicillins Rash and Other (See Comments)    Rash and blisters Has patient had a PCN reaction causing immediate rash, facial/tongue/throat swelling, SOB or lightheadedness with hypotension: Yes Has patient had a PCN reaction causing severe rash involving mucus membranes or skin necrosis: No Has patient had a PCN reaction that required hospitalization: No Has patient had a PCN reaction occurring within the last 10 years: No If all of the above answers are "NO", then may proceed with Cephalosporin use.    Sulfa Antibiotics Rash  :   Family History  Problem Relation Age  of Onset   Heart disease Mother    Heart failure Mother    Heart disease Father    Ovarian cancer Maternal Aunt    Heart disease Paternal Uncle        multiple   Autoimmune disease Sister    Arrhythmia Sister    Anemia Sister    CAD Brother    Colon cancer Neg Hx    Stomach cancer Neg Hx    Esophageal cancer Neg Hx    Rectal cancer Neg Hx   :   Social History   Socioeconomic History   Marital status: Married    Spouse name: Not on file   Number of children: 1   Years of education: 16   Highest education  level: Not on file  Occupational History   Occupation: self employed  Tobacco Use   Smoking status: Never   Smokeless tobacco: Never  Vaping Use   Vaping Use: Never used  Substance and Sexual Activity   Alcohol use: Yes    Comment: Drinks one mixed drink nightly   Drug use: No   Sexual activity: Yes    Birth control/protection: Post-menopausal  Other Topics Concern   Not on file  Social History Narrative   Lives with husband in a 2 story home.  Has 1 daughter.     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     Education: college.    Social Determinants of Health   Financial Resource Strain: Not on file  Food Insecurity: Not on file  Transportation Needs: Not on file  Physical Activity: Not on file  Stress: Not on file  Social Connections: Not on file  Intimate Partner Violence: Not on file  :  Review of Systems  Constitutional: Negative.   HENT: Negative.    Eyes: Negative.   Respiratory: Negative.    Cardiovascular: Negative.   Gastrointestinal: Negative.   Genitourinary: Negative.   Musculoskeletal: Negative.   Skin: Negative.   Neurological: Negative.   Endo/Heme/Allergies: Negative.   Psychiatric/Behavioral: Negative.       Exam: Vital signs show temperature of 98.5.  Pulse 75.  Blood pressure 147/71.  Her weight is 160 pounds.  _0 @ Physical Exam Vitals reviewed.  Constitutional:      Comments: Her breast exam shows right breast with  no masses, edema or erythema.  There is no areola or nipple changes.  There is no fullness in the right axilla.  Left breast shows changes of surgery in the upper outer quadrant at about the 2 o'clock position.  There is firmness at the lumpectomy site.  There is some slight firmness of the left breast from past radiation.  There is no distinct mass in the left breast.  There is no left axillary adenopathy.  HENT:     Head: Normocephalic and atraumatic.  Eyes:     Pupils: Pupils are equal, round, and reactive to light.  Cardiovascular:     Rate and Rhythm: Normal rate and regular rhythm.     Heart sounds: Normal heart sounds.  Pulmonary:     Effort: Pulmonary effort is normal.     Breath sounds: Normal breath sounds.  Abdominal:     General: Bowel sounds are normal.     Palpations: Abdomen is soft.  Musculoskeletal:        General: No tenderness or deformity. Normal range of motion.     Cervical back: Normal range of motion.  Lymphadenopathy:     Cervical: No cervical adenopathy.  Skin:    General: Skin is warm and dry.     Findings: No erythema or rash.     Comments: Her skin exam is unremarkable.  She has a slight rash on her abdomen.  This is a very faint and erythematous.  It is not blanch.  It is not pruritic.  Neurological:     Mental Status: She is alert and oriented to person, place, and time.  Psychiatric:        Behavior: Behavior normal.        Thought Content: Thought content normal.        Judgment: Judgment normal.     Recent Labs    02/20/22 1035  WBC 7.6  HGB 13.0  HCT 40.6  PLT 197  Recent Labs    02/20/22 1035  NA 138  K 4.6  CL 103  CO2 28  GLUCOSE 104*  BUN 12  CREATININE 1.10*  CALCIUM 9.9    Blood smear review: None  Pathology: See above    Assessment and Plan: Ms. [name] is a very charming 71 year old white female.  Her main problem is the scleroderma.  Everything is tied into scleroderma as to what can be done with respect to  this abnormal MRI.  I think that probably what might be a reasonable way to go, which would certainly be aggressive would be to do a biopsy with general anesthesia.  She cannot have local anesthesia with epinephrine or lidocaine because of allergies.  I would think that she would likely not have a malignancy with respect to the MRI findings.  I cannot palpate anything in the right breast.  However, given the history of scleroderma, I think that she probably does have a higher risk of malignancy.  It will be interesting to see what Dr. [name] has to say about this.  It is possible that he may wish to just watch for right now.  He may think that she have another MRI in 2 or 3 months to see if there is any change.  I am not sure if Ms. Hally would like to wait that long to see if there is malignancy.  I think that she probably would favor more "proactive" approach and doing a biopsy sooner than later.  Of note, she goes back To [facility] in a couple weeks to see her scleroderma specialist.  We will plan to get her back if there is a diagnosis of cancer.  If there is a diagnosis of cancer, I would think that it would be estrogen receptor positive.  If so, then I would consider giving her "neoadjuvant" therapy with an aromatase inhibitor to try to shrink the cancer to make surgery as less invasive as possible.  I think she would be in favor of this.  I had a wonderful time with she and her husband.  They are both very delightful to talk with him.

## 2022-02-20 NOTE — Progress Notes (Signed)
Initial RN Navigator Patient Visit  Name: [name] Diagnosis: History of Breast Cancer with new suspicious finding on MRI  Met with patient prior to their visit with MD. Patient was originally seen and treated with [name]. He signed off on her care last year. MRI completed this year shows irregularity. She refuses MRI guided biopsy due to her allergies and previous poor experience.   Patient has an appointment with [name] to discuss excisional biopsy, however she is going to have to reschedule due to a conflict with another appointment. She wants to wait until she gets [name] opinion before rescheduling.   Patient completed visit with Jennifer. Marin [name]. Patient will see [name] for possible excisional biopsy. We will follow up depending on biopsy results.   Patient understands all follow up procedures and expectations. They have my number to reach out for any further clarification or additional needs.  Oncology Nurse Navigator Documentation     02/20/2022   11:00 AM  Oncology Nurse Navigator Flowsheets  Abnormal Finding Date 02/05/2022  Diagnosis Status Additional Work Up  Navigator Follow Up Date: 03/04/2022  Navigator Follow Up Reason: Review Note  Financial planner  Navigator Encounter Type Initial MedOnc  Patient Visit Type MedOnc  Treatment Phase Abnormal Scans  Barriers/Navigation Needs Education  Education Pain/ Symptom Management;Other;Understanding Cancer/ Treatment Options  Interventions Education;Psycho-Social Support  Acuity Level 2-Minimal Needs (1-2 Barriers Identified)  Education Method Verbal  Support Groups/Services Friends and Family  Time Spent with Patient 83

## 2022-02-21 ENCOUNTER — Telehealth: Payer: Self-pay | Admitting: *Deleted

## 2022-02-21 ENCOUNTER — Encounter: Payer: Self-pay | Admitting: Hematology & Oncology

## 2022-02-21 DIAGNOSIS — M3489 Other systemic sclerosis: Secondary | ICD-10-CM | POA: Diagnosis not present

## 2022-02-21 DIAGNOSIS — R208 Other disturbances of skin sensation: Secondary | ICD-10-CM | POA: Diagnosis not present

## 2022-02-21 DIAGNOSIS — M79646 Pain in unspecified finger(s): Secondary | ICD-10-CM | POA: Diagnosis not present

## 2022-02-21 DIAGNOSIS — M25641 Stiffness of right hand, not elsewhere classified: Secondary | ICD-10-CM | POA: Diagnosis not present

## 2022-02-21 DIAGNOSIS — R29898 Other symptoms and signs involving the musculoskeletal system: Secondary | ICD-10-CM | POA: Diagnosis not present

## 2022-02-21 DIAGNOSIS — M25642 Stiffness of left hand, not elsewhere classified: Secondary | ICD-10-CM | POA: Diagnosis not present

## 2022-02-21 DIAGNOSIS — M79643 Pain in unspecified hand: Secondary | ICD-10-CM | POA: Diagnosis not present

## 2022-02-21 DIAGNOSIS — M349 Systemic sclerosis, unspecified: Secondary | ICD-10-CM | POA: Diagnosis not present

## 2022-02-21 LAB — CANCER ANTIGEN 27.29: CA 27.29: 56.9 U/mL — ABNORMAL HIGH (ref 0.0–38.6)

## 2022-02-21 NOTE — Telephone Encounter (Signed)
Per 02/20/22 los - No follow-up is needed at this time.

## 2022-02-22 ENCOUNTER — Encounter: Payer: Self-pay | Admitting: *Deleted

## 2022-02-22 ENCOUNTER — Telehealth (HOSPITAL_BASED_OUTPATIENT_CLINIC_OR_DEPARTMENT_OTHER): Payer: Self-pay | Admitting: *Deleted

## 2022-02-22 DIAGNOSIS — R21 Rash and other nonspecific skin eruption: Secondary | ICD-10-CM | POA: Diagnosis not present

## 2022-02-22 DIAGNOSIS — L293 Anogenital pruritus, unspecified: Secondary | ICD-10-CM | POA: Diagnosis not present

## 2022-02-22 DIAGNOSIS — M349 Systemic sclerosis, unspecified: Secondary | ICD-10-CM | POA: Diagnosis not present

## 2022-02-22 NOTE — Addendum Note (Signed)
Addended by: Burney Gauze R on: 02/22/2022 05:46 PM   Modules accepted: Orders

## 2022-02-22 NOTE — Progress Notes (Signed)
See MyChart communication from 02/21/22  Spoke to tech in nuclear med and she stated that we do not use iodinated contrast for PET but rather just radio-tracer media.  Oncology Nurse Navigator Documentation     02/22/2022   10:30 AM  Oncology Nurse Navigator Flowsheets  Navigator Follow Up Date: 03/20/2022  Navigator Follow Up Reason: Review Note  Navigator Location [facility]  Navigator Encounter Type MyChart  Patient Visit Type MedOnc  Treatment Phase Abnormal Scans  Barriers/Navigation Needs Education  Education Other  Interventions Coordination of Care;Psycho-Social Support  Acuity Level 2-Minimal Needs (1-2 Barriers Identified)  Education Method Written  Support Groups/Services Friends and Family  Time Spent with Patient 15

## 2022-02-22 NOTE — Telephone Encounter (Signed)
Patient called and left a message stated that she would like to come by and leave a urine sample and that she has a red mark on her stomach .

## 2022-02-22 NOTE — Telephone Encounter (Signed)
Pt calls with complaints of having a rash on her lower abdomen and some sensitivity on her legs in the groin area.  She said that she has tried using monistat but has not had any relief. Pt is not having any internal vaginal symptoms or urinary symptoms. Pt states that her oncologist looked at the area at a recent visit and doesn't feel that it is related to her cancer. Advised pt that she should call her PCP to see if they can get her in for an appt for evaluation. Pt verbalized understanding.

## 2022-02-25 ENCOUNTER — Encounter: Payer: Self-pay | Admitting: *Deleted

## 2022-02-25 NOTE — Progress Notes (Signed)
See Mychart communication dated 02/21/22.  PET scan scheduled for 02/28/2022. Patient is aware of appointment date, time and location as well as the prep. All information sent via MyChart.   Oncology Nurse Navigator Documentation     02/25/2022    8:45 AM  Oncology Nurse Navigator Flowsheets  Navigator Follow Up Date: 03/01/2022  Navigator Follow Up Reason: Scan Review  Navigator Location [facility]  Navigator Encounter Type MyChart  Patient Visit Type MedOnc  Treatment Phase Abnormal Scans  Barriers/Navigation Needs Coordination of Care;Education  Education Other  Interventions Coordination of Care;Education  Acuity Level 2-Minimal Needs (1-2 Barriers Identified)  Coordination of Care Radiology  Education Method Written  Support Groups/Services Friends and Family  Time Spent with Patient 30

## 2022-02-28 ENCOUNTER — Ambulatory Visit (HOSPITAL_COMMUNITY)
Admission: RE | Admit: 2022-02-28 | Discharge: 2022-02-28 | Disposition: A | Discharge status: Home / Self Care | Payer: Medicare Other | Source: Ambulatory Visit | Attending: Hematology & Oncology | Admitting: Hematology & Oncology

## 2022-02-28 DIAGNOSIS — C50412 Malignant neoplasm of upper-outer quadrant of left female breast: Secondary | ICD-10-CM | POA: Diagnosis not present

## 2022-02-28 DIAGNOSIS — Z170 Estrogen receptor positive status [ER+]: Secondary | ICD-10-CM | POA: Insufficient documentation

## 2022-02-28 DIAGNOSIS — C7981 Secondary malignant neoplasm of breast: Secondary | ICD-10-CM | POA: Diagnosis not present

## 2022-02-28 LAB — GLUCOSE, CAPILLARY: Glucose-Capillary: 103 mg/dL — ABNORMAL HIGH (ref 70–99)

## 2022-02-28 MED ORDER — FLUDEOXYGLUCOSE F - 18 (FDG) INJECTION
7.9000 | Freq: Once | INTRAVENOUS | Status: AC
  Administered 2022-02-28: 7.9 via INTRAVENOUS

## 2022-03-05 ENCOUNTER — Encounter: Payer: Self-pay | Admitting: *Deleted

## 2022-03-05 DIAGNOSIS — M349 Systemic sclerosis, unspecified: Secondary | ICD-10-CM | POA: Diagnosis not present

## 2022-03-05 DIAGNOSIS — I7300 Raynaud's syndrome without gangrene: Secondary | ICD-10-CM | POA: Diagnosis not present

## 2022-03-05 DIAGNOSIS — Z79899 Other long term (current) drug therapy: Secondary | ICD-10-CM | POA: Diagnosis not present

## 2022-03-05 DIAGNOSIS — R609 Edema, unspecified: Secondary | ICD-10-CM | POA: Diagnosis not present

## 2022-03-05 DIAGNOSIS — R635 Abnormal weight gain: Secondary | ICD-10-CM | POA: Diagnosis not present

## 2022-03-05 NOTE — Progress Notes (Signed)
Reviewed PET scan which is negative for any malignant concerns. Patient sees surgeon to discuss excisional biopsy on 03/20/2022.  Oncology Nurse Navigator Documentation     03/05/2022    9:45 AM  Oncology Nurse Navigator Flowsheets  Navigator Follow Up Date: 03/20/2022  Navigator Follow Up Reason: Review Note  Navigator Location [facility]  Navigator Encounter Type Scan Review  Patient Visit Type MedOnc  Treatment Phase Abnormal Scans  Barriers/Navigation Needs Coordination of Care;Education  Interventions None Required  Acuity Level 2-Minimal Needs (1-2 Barriers Identified)  Support Groups/Services Friends and Family  Time Spent with Patient 15

## 2022-03-07 ENCOUNTER — Encounter: Payer: Self-pay | Admitting: Hematology & Oncology

## 2022-03-07 ENCOUNTER — Encounter: Payer: Self-pay | Admitting: *Deleted

## 2022-03-07 ENCOUNTER — Telehealth: Payer: Self-pay

## 2022-03-07 NOTE — Progress Notes (Signed)
See MyChart communication dated 8/24/20203.  Oncology Nurse Navigator Documentation     03/07/2022    1:45 PM  Oncology Nurse Navigator Flowsheets  Navigator Follow Up Date: 03/20/2022  Navigator Follow Up Reason: Review Note  Navigator Location [facility]  Navigator Encounter Type MyChart  Patient Visit Type MedOnc  Treatment Phase Abnormal Scans  Barriers/Navigation Needs Coordination of Care;Education  Education Other  Interventions Education  Acuity Level 2-Minimal Needs (1-2 Barriers Identified)  Education Method Written  Support Groups/Services Friends and Family  Time Spent with Patient 30

## 2022-03-07 NOTE — Telephone Encounter (Signed)
Patient called stating Dr.Enever recommended you as a primary care doctor , I made her aware you weren't taking new patients at the moment but I would send over a request.

## 2022-03-08 DIAGNOSIS — N3021 Other chronic cystitis with hematuria: Secondary | ICD-10-CM | POA: Diagnosis not present

## 2022-03-08 DIAGNOSIS — M25552 Pain in left hip: Secondary | ICD-10-CM | POA: Diagnosis not present

## 2022-03-08 DIAGNOSIS — M62838 Other muscle spasm: Secondary | ICD-10-CM | POA: Diagnosis not present

## 2022-03-08 DIAGNOSIS — M6281 Muscle weakness (generalized): Secondary | ICD-10-CM | POA: Diagnosis not present

## 2022-03-08 DIAGNOSIS — R102 Pelvic and perineal pain: Secondary | ICD-10-CM | POA: Diagnosis not present

## 2022-03-08 DIAGNOSIS — M6289 Other specified disorders of muscle: Secondary | ICD-10-CM | POA: Diagnosis not present

## 2022-03-08 NOTE — Telephone Encounter (Signed)
Please set up NP appointment.

## 2022-03-12 DIAGNOSIS — R600 Localized edema: Secondary | ICD-10-CM | POA: Diagnosis not present

## 2022-03-12 DIAGNOSIS — M79643 Pain in unspecified hand: Secondary | ICD-10-CM | POA: Diagnosis not present

## 2022-03-12 DIAGNOSIS — R208 Other disturbances of skin sensation: Secondary | ICD-10-CM | POA: Diagnosis not present

## 2022-03-12 DIAGNOSIS — M79646 Pain in unspecified finger(s): Secondary | ICD-10-CM | POA: Diagnosis not present

## 2022-03-12 DIAGNOSIS — M349 Systemic sclerosis, unspecified: Secondary | ICD-10-CM | POA: Diagnosis not present

## 2022-03-12 DIAGNOSIS — M25641 Stiffness of right hand, not elsewhere classified: Secondary | ICD-10-CM | POA: Diagnosis not present

## 2022-03-12 DIAGNOSIS — M25642 Stiffness of left hand, not elsewhere classified: Secondary | ICD-10-CM | POA: Diagnosis not present

## 2022-03-12 DIAGNOSIS — R29898 Other symptoms and signs involving the musculoskeletal system: Secondary | ICD-10-CM | POA: Diagnosis not present

## 2022-03-12 DIAGNOSIS — M3489 Other systemic sclerosis: Secondary | ICD-10-CM | POA: Diagnosis not present

## 2022-03-18 NOTE — Progress Notes (Signed)
[place] at [facility] [address] [place], [place] [address] [phone]  Date:  03/21/2022   Name:  [name]   DOB:  15-Aug-1950   MRN:  [id number]  PCP:  Darreld Mclean, MD    Chief Complaint: New Patient (Initial Visit) (Concerns/ questions: just would like you to be aware that she has Scleroderma. )   History of Present Illness:  [name] is a 71 y.o. very pleasant female patient who presents with the following:  Patient seen today to establish care-seen on request of Dr. Burney Gauze She had LT breast cancer about a decade ago and we think her scleroderma was activated by this   She has history of diffuse cutaneous systemic sclerosis, has been following with rheumatology at [facility] in [place].  She goes to [facility] every 6 months and is also seen at [facility] periodically as well Per most recent [facility] note Assessment and Plan [name] is a 71 y.o. 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 The patient also has a history of left breast cancer. Since her last visit, she has had persistent issues with shingles. She has been off of mycophenolate (previously dosed at 2.5 grams daily) since 07/15/21.   More recently she has noticed issues with fluid retention, and her weight is higher than at her prior visit. She also has mild cutaneous discomfort without significant itching. Her skin exam is fairly stable today without significant skin thickening, but we will need to monitor this closely given her symptoms. At this time, I do not think that we need to reinitiate therapy with mycophenolate mofetil. She sees Dr. Artis Delay at [facility] in October for reassessment.  Another major issue since her last visit was the  abnormal breast MRI with an elevated CA 27-29 level. Her PET/CT did not show any evidence of metastatic disease, and she is following closely with her oncologist. I have asked her to clarify the follow-up plan with respect to serial imaging and tumor marker levels, but she believes that they will be monitoring her tumor markers closely.  Her Raynaud's is stable on her current regimen, and she is doing fairly well from a GI perspective. She is getting another PFT and echocardiogram in October.  With respect to edema and fluid retention, we discussed updating her proBNP, assessing her urine for protein, and updating her thyroid function tests. She will continue PT and hand OT which has been very beneficial for her in the past.  She should continue to monitor her blood pressure at home. Her renal function was assessed recently and this was stable.  She had Covid infection in January 2022 and has declined vaccination.  Follow up is planned in ~6 months time.  Dr Bing Quarry at [facility] is her local rheumatologist Another Dr Brigitte Pulse- Royetta Car at [facility] is her point person at [facility]  Dr [name] is her local rheumatologist  Dr [name] is following her breast imaging here- she is doing periodic MRI as they are trying to avoid further radiation to her chest  They did do a pet which was negative but her cancer antigen was elevated  GYN is Dr Sabra Heck  Dr Hilarie Fredrickson is her GI- she is UTD on her colon for now   She had shingles in January of this year -  it took it a long time to go away and was very severe.  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   She was one daughter -15 yo,  2 grands aged 32 and 79- they all live in [place]  In her free time she loves traveling She has tried pravastatin and atorvastatin so far- did not tolerate great, she had to stop using these  Her lipids had gone up due to steroid use when she had the shingles  She would like to recheck her lipids at some time in the near future  The 10-year ASCVD risk score (Arnett DK, et al., 2019) is: 14.8%   Values used to calculate the score:     Age: 12 years     Sex: Female     Is Non-Hispanic African American: No     Diabetic: No     Tobacco smoker: No     Systolic Blood Pressure: 856 mmHg     Is BP treated: Yes     HDL Cholesterol: 65 mg/dL     Total Cholesterol: 231 mg/dL  Patient Active Problem List   Diagnosis Date Noted   Sjogren's syndrome ([place]) 02/14/2021   History of UTI 02/05/2021   Hyperlipidemia    History of colonic polyps    Anemia    Acute esophagitis    Hypertension 09/09/2017   High risk medication use 05/08/2017   Raynaud's disease without gangrene 05/08/2017   Diffuse systemic sclerosis ([place]) 05/08/2017   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   Abnormal renal function test 10/31/2016   Mass of multiple sites of right breast 12/27/2013   Osteopenia 12/27/2013   Postmenopausal 12/27/2013   Malignant neoplasm of upper-outer quadrant of left breast in female, estrogen receptor positive ([place]) 06/21/2013   Dense breasts 12/14/2012   Osteoarthritis     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, ER/PR+, HER2 negative -- 11-04-2011  left partial mastectomy w/ sln dissection-- completed radiation 01-08-2012-- started tamoxifen 07/ 2013-- per lov note 11/ 2018 no recurrence   Colon polyp    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 Ruthann Cancer at [facility]   systemic sclerosis , diffuse/   rheumotologist:  dr Manuella Ghazi at [facility]---  lov 07-22-2017 (as of 07-25-2017 note not available to read)---  per pt has appt. w/ specialist at [facility]   Skin thickening    hands, arms, legs  caused by scleroderma   Systemic sclerosis ([place])    UTI (urinary tract infection)     Past Surgical History:  Procedure Laterality Date   BIOPSY  01/29/2018   Procedure: BIOPSY;  Surgeon: Jerene Bears, MD;  Location: [facility];  Service: Gastroenterology;;   Lillard Anes  2000   Left   COLONOSCOPY WITH PROPOFOL N/A 01/29/2018   Procedure: COLONOSCOPY WITH PROPOFOL;  Surgeon: Jerene Bears, MD;  Location: [facility];  Service: Gastroenterology;  Laterality: N/A;   DILATATION & CURETTAGE/HYSTEROSCOPY WITH MYOSURE N/A 08/04/2017   Procedure: DILATATION & CURETTAGE/HYSTEROSCOPY WITH MYOSURE;  Surgeon: Megan Salon, MD;  Location: [facility];  Service: Gynecology;  Laterality: N/A;   ECTOPIC PREGNANCY SURGERY  1986-88   s/p unilateral salpingectomy   ESOPHAGOGASTRODUODENOSCOPY (EGD) WITH PROPOFOL N/A 01/29/2018   Procedure: ESOPHAGOGASTRODUODENOSCOPY (EGD) WITH PROPOFOL;  Surgeon: Jerene Bears, MD;  Location: [facility];  Service: Gastroenterology;  Laterality: N/A;   PARTIAL MASTECTOMY WITH AXILLARY SENTINEL LYMPH NODE BIOPSY Left 11-04-2011   dr Margot Chimes  [facility]   TRANSTHORACIC ECHOCARDIOGRAM  06-04-2017    [facility]   moderate LVH, ef>55%/  trivial AR and TR/  mild MR and PR/  trivial pericardial effusion   WISDOM TOOTH EXTRACTION      Social History   Tobacco Use   Smoking status: Never   Smokeless tobacco: Never  Vaping Use   Vaping Use: Never used  Substance Use Topics   Alcohol use: Yes    Comment: Drinks one mixed drink nightly   Drug use: No    Family History  Problem Relation Age of Onset   Heart disease Mother    Heart failure Mother    Heart disease Father    Ovarian cancer Maternal Aunt    Heart disease Paternal Uncle        multiple   Autoimmune disease Sister    Arrhythmia Sister    Anemia Sister    CAD Brother    Colon cancer Neg Hx    Stomach cancer Neg Hx    Esophageal cancer Neg Hx    Rectal cancer Neg Hx     Allergies  Allergen Reactions   Betadine [Povidone Iodine] Rash   Contrast Media [Iodinated Contrast Media] Rash    "per pt recently had CT 08/ 2018 even through given pre-medication, IVP still caused rash"   Epinephrine Other (See Comments)    Sever headaches   Oxycodone Anxiety   Gabapentin     Other reaction(s): dizziness   Lidocaine Rash   Nickel Rash and Other (See Comments)    Rash and blisters   Other Rash and Other (See Comments)    Hospital gown   Penicillins Rash and Other (See Comments)    Rash and blisters Has patient had a PCN reaction causing immediate rash, facial/tongue/throat swelling, SOB or lightheadedness with hypotension: Yes Has patient had a PCN reaction causing severe rash involving mucus membranes or skin necrosis: No Has patient had a PCN reaction that required hospitalization: No Has patient had a PCN reaction occurring within the last 10 years: No If all of the above answers are "NO", then may proceed with Cephalosporin use.    Sulfa Antibiotics Rash    Medication list has been reviewed and updated.  Current Outpatient Medications on File Prior  to Visit  Medication Sig Dispense Refill   acetaminophen (TYLENOL) 650 MG CR tablet Take 650 mg by mouth as needed.      ELDERBERRY PO Take by mouth 2 (two) times daily.     furosemide (LASIX) 20 MG tablet Take 20 mg by mouth daily.     levothyroxine (SYNTHROID) 88 MCG tablet Take 88 mcg by mouth daily.     OVER THE COUNTER MEDICATION Relief Factor     No current facility-administered medications on file prior to visit.    Review of Systems:  As per HPI- otherwise negative.   Physical Examination: Vitals:   03/21/22 1339  BP: 124/60  Pulse: 81  Resp: 18  Temp: 97.7 F (36.5 C)  SpO2: 99%   Vitals:   03/21/22 1339  Weight: 158 lb 3.2 oz (71.8 kg)  Height: 5' 4.02" (1.626 m)   Body mass index is 27.14 kg/m. Ideal Body Weight: Weight in (lb) to have BMI = 25: 145.4  GEN: no acute distress.  Looks well and significantly younger than age, likely partially due to skin tightness from scleroderma Minimal overweight HEENT: Atraumatic, Normocephalic.  Ears and Nose: No external deformity. CV: RRR, No M/G/R. No JVD. No thrill. No extra heart sounds. PULM: CTA B, no wheezes, crackles, rhonchi. No retractions. No resp. distress. No accessory muscle use. ABD: S, NT, ND, +BS. No rebound. No HSM. EXTR: No c/c/e PSYCH: Normally interactive. Conversant.  She has tightening of the skin on her hands especially   Assessment and Plan: Diffuse cutaneous systemic sclerosis (HCC)  Raynaud's phenomenon without gangrene  History of breast cancer  History of shingles  Acquired hypothyroidism  Patient seen today to establish care and discuss her recent health history.  [name] was generally healthy until about 10 years ago when she had breast cancer. Unfortunately, it seems that either breast cancer itself or radiation treatment triggered her cutaneous systemic sclerosis.  She also suffered a serious bout with shingles earlier this year. She is under the care of several different specialists as detailed above.  I advised her I am here to assist in any way that we can, we are certainly glad to monitor her  cholesterol etc.   Signed Lamar Blinks, MD

## 2022-03-20 DIAGNOSIS — R928 Other abnormal and inconclusive findings on diagnostic imaging of breast: Secondary | ICD-10-CM | POA: Diagnosis not present

## 2022-03-21 ENCOUNTER — Encounter: Payer: Self-pay | Admitting: *Deleted

## 2022-03-21 ENCOUNTER — Ambulatory Visit (INDEPENDENT_AMBULATORY_CARE_PROVIDER_SITE_OTHER): Payer: Medicare Other | Admitting: Family Medicine

## 2022-03-21 VITALS — BP 124/60 | HR 81 | Temp 97.7°F | Resp 18 | Ht 64.02 in | Wt 158.2 lb

## 2022-03-21 DIAGNOSIS — M3489 Other systemic sclerosis: Secondary | ICD-10-CM | POA: Diagnosis not present

## 2022-03-21 DIAGNOSIS — Z853 Personal history of malignant neoplasm of breast: Secondary | ICD-10-CM

## 2022-03-21 DIAGNOSIS — E039 Hypothyroidism, unspecified: Secondary | ICD-10-CM

## 2022-03-21 DIAGNOSIS — Z8619 Personal history of other infectious and parasitic diseases: Secondary | ICD-10-CM

## 2022-03-21 DIAGNOSIS — I7300 Raynaud's syndrome without gangrene: Secondary | ICD-10-CM

## 2022-03-21 NOTE — Progress Notes (Signed)
Patient was seen by Dr [name] for consideration of excisional biopsy. He is recommending a breast mammogram with possible US biopsy. The patient is agreeable. Will follow for scheduling.   Oncology Nurse Navigator Documentation     03/21/2022   10:45 AM  Oncology Nurse Navigator Flowsheets  Navigator Follow Up Date: 03/25/2022  Navigator Follow Up Reason: Appointment Review  Navigator Location [facility]  Navigator Encounter Type Appt/Treatment Plan Review  Patient Visit Type MedOnc  Treatment Phase Abnormal Scans  Barriers/Navigation Needs Coordination of Care;Education  Interventions None Required  Acuity Level 2-Minimal Needs (1-2 Barriers Identified)  Support Groups/Services Friends and Family  Time Spent with Patient 15

## 2022-03-21 NOTE — Patient Instructions (Addendum)
It was very nice to meet you today!  Please let me know what I can do to help you- we are glad to check your labs, thyroid, etc as you need

## 2022-03-22 DIAGNOSIS — R102 Pelvic and perineal pain: Secondary | ICD-10-CM | POA: Diagnosis not present

## 2022-03-22 DIAGNOSIS — M62838 Other muscle spasm: Secondary | ICD-10-CM | POA: Diagnosis not present

## 2022-03-22 DIAGNOSIS — M6289 Other specified disorders of muscle: Secondary | ICD-10-CM | POA: Diagnosis not present

## 2022-03-22 DIAGNOSIS — M25552 Pain in left hip: Secondary | ICD-10-CM | POA: Diagnosis not present

## 2022-03-22 DIAGNOSIS — M6281 Muscle weakness (generalized): Secondary | ICD-10-CM | POA: Diagnosis not present

## 2022-03-22 DIAGNOSIS — M349 Systemic sclerosis, unspecified: Secondary | ICD-10-CM | POA: Diagnosis not present

## 2022-03-26 DIAGNOSIS — M3489 Other systemic sclerosis: Secondary | ICD-10-CM | POA: Diagnosis not present

## 2022-03-26 DIAGNOSIS — R600 Localized edema: Secondary | ICD-10-CM | POA: Diagnosis not present

## 2022-03-26 DIAGNOSIS — M79643 Pain in unspecified hand: Secondary | ICD-10-CM | POA: Diagnosis not present

## 2022-03-26 DIAGNOSIS — M79646 Pain in unspecified finger(s): Secondary | ICD-10-CM | POA: Diagnosis not present

## 2022-03-26 DIAGNOSIS — M25641 Stiffness of right hand, not elsewhere classified: Secondary | ICD-10-CM | POA: Diagnosis not present

## 2022-03-26 DIAGNOSIS — M349 Systemic sclerosis, unspecified: Secondary | ICD-10-CM | POA: Diagnosis not present

## 2022-03-26 DIAGNOSIS — R208 Other disturbances of skin sensation: Secondary | ICD-10-CM | POA: Diagnosis not present

## 2022-03-26 DIAGNOSIS — M25642 Stiffness of left hand, not elsewhere classified: Secondary | ICD-10-CM | POA: Diagnosis not present

## 2022-03-26 DIAGNOSIS — R29898 Other symptoms and signs involving the musculoskeletal system: Secondary | ICD-10-CM | POA: Diagnosis not present

## 2022-03-27 ENCOUNTER — Other Ambulatory Visit: Payer: Self-pay | Admitting: General Surgery

## 2022-03-27 DIAGNOSIS — R928 Other abnormal and inconclusive findings on diagnostic imaging of breast: Secondary | ICD-10-CM

## 2022-03-28 ENCOUNTER — Other Ambulatory Visit: Payer: Self-pay | Admitting: General Surgery

## 2022-03-28 ENCOUNTER — Encounter: Payer: Self-pay | Admitting: *Deleted

## 2022-03-28 DIAGNOSIS — R928 Other abnormal and inconclusive findings on diagnostic imaging of breast: Secondary | ICD-10-CM

## 2022-03-28 NOTE — Progress Notes (Signed)
Patient scheduled for mammogram and [place] on 04/17/2022.  Oncology Nurse Navigator Documentation     03/28/2022    1:30 PM  Oncology Nurse Navigator Flowsheets  Navigator Follow Up Date: 04/17/2022  Navigator Follow Up Reason: Scan Review  Navigator Location [facility]  Navigator Encounter Type Appt/Treatment Plan Review  Patient Visit Type MedOnc  Treatment Phase Abnormal Scans  Barriers/Navigation Needs Coordination of Care;Education  Interventions None Required  Acuity Level 2-Minimal Needs (1-2 Barriers Identified)  Support Groups/Services Friends and Family  Time Spent with Patient 15

## 2022-04-01 DIAGNOSIS — M6281 Muscle weakness (generalized): Secondary | ICD-10-CM | POA: Diagnosis not present

## 2022-04-01 DIAGNOSIS — M25552 Pain in left hip: Secondary | ICD-10-CM | POA: Diagnosis not present

## 2022-04-01 DIAGNOSIS — M6289 Other specified disorders of muscle: Secondary | ICD-10-CM | POA: Diagnosis not present

## 2022-04-01 DIAGNOSIS — M62838 Other muscle spasm: Secondary | ICD-10-CM | POA: Diagnosis not present

## 2022-04-01 DIAGNOSIS — R102 Pelvic and perineal pain: Secondary | ICD-10-CM | POA: Diagnosis not present

## 2022-04-17 ENCOUNTER — Ambulatory Visit: Admission: RE | Admit: 2022-04-17 | Payer: Medicare Other | Source: Ambulatory Visit

## 2022-04-17 ENCOUNTER — Encounter: Payer: Self-pay | Admitting: *Deleted

## 2022-04-17 ENCOUNTER — Ambulatory Visit
Admission: RE | Admit: 2022-04-17 | Discharge: 2022-04-17 | Disposition: A | Discharge status: Home / Self Care | Payer: Medicare Other | Source: Ambulatory Visit | Attending: General Surgery | Admitting: General Surgery

## 2022-04-17 DIAGNOSIS — R928 Other abnormal and inconclusive findings on diagnostic imaging of breast: Secondary | ICD-10-CM

## 2022-04-17 DIAGNOSIS — R922 Inconclusive mammogram: Secondary | ICD-10-CM | POA: Diagnosis not present

## 2022-04-17 NOTE — Progress Notes (Signed)
Patient's mammogram negative for any mass. Will follow up with Dr [name] on plan for follow up.   Oncology Nurse Navigator Documentation     04/17/2022    2:00 PM  Oncology Nurse Navigator Flowsheets  Navigator Follow Up Date: 04/18/2022  Navigator Follow Up Reason: Patient Call  Navigator Location [facility]  Navigator Encounter Type Scan Review  Patient Visit Type MedOnc  Treatment Phase Abnormal Scans  Barriers/Navigation Needs Coordination of Care;Education  Interventions Coordination of Care  Acuity Level 2-Minimal Needs (1-2 Barriers Identified)  Support Groups/Services Friends and Family  Time Spent with Patient 15

## 2022-04-18 ENCOUNTER — Encounter: Payer: Self-pay | Admitting: *Deleted

## 2022-04-18 NOTE — Progress Notes (Signed)
Reviewed mammogram with Dr [name]. Due to her elevated tumor marker he would like to continue seeing her in follow up. Message sent to scheduling for follow up appointment in November.  Called and spoke to patient. Notified her that Dr [name] planned to call her, but that no further workup was needed at this time. Explained that due to an elevated tumor marker that Dr [name] wanted to continue close observation and that scheduling would be reaching out to schedule an appointment in November. She was in agreement.   Since patient will be on observation only, will discontinue active navigation, but be available to the patient as needed.   Oncology Nurse Navigator Documentation     04/18/2022    8:30 AM  Oncology Nurse Navigator Flowsheets  Navigation Complete Date: 04/18/2022  Post Navigation: Continue to Follow Patient? No  Reason Not Navigating Patient: No Treatment, Observation Only  Production assistant, radio Encounter Type Telephone  Telephone Diagnostic Results;Outgoing Call  Patient Visit Type MedOnc  Treatment Phase Abnormal Scans  Barriers/Navigation Needs Coordination of Care;Education  Education Other  Interventions Coordination of Care;Education;Psycho-Social Support  Acuity Level 2-Minimal Needs (1-2 Barriers Identified)  Coordination of Care Appts  Education Method Verbal  Support Groups/Services Friends and Family  Time Spent with Patient 30

## 2022-04-19 DIAGNOSIS — R102 Pelvic and perineal pain: Secondary | ICD-10-CM | POA: Diagnosis not present

## 2022-04-19 DIAGNOSIS — M6281 Muscle weakness (generalized): Secondary | ICD-10-CM | POA: Diagnosis not present

## 2022-04-19 DIAGNOSIS — M62838 Other muscle spasm: Secondary | ICD-10-CM | POA: Diagnosis not present

## 2022-04-19 DIAGNOSIS — M25552 Pain in left hip: Secondary | ICD-10-CM | POA: Diagnosis not present

## 2022-04-24 DIAGNOSIS — M79646 Pain in unspecified finger(s): Secondary | ICD-10-CM | POA: Diagnosis not present

## 2022-04-24 DIAGNOSIS — R600 Localized edema: Secondary | ICD-10-CM | POA: Diagnosis not present

## 2022-04-24 DIAGNOSIS — R29898 Other symptoms and signs involving the musculoskeletal system: Secondary | ICD-10-CM | POA: Diagnosis not present

## 2022-04-24 DIAGNOSIS — M25642 Stiffness of left hand, not elsewhere classified: Secondary | ICD-10-CM | POA: Diagnosis not present

## 2022-04-24 DIAGNOSIS — M349 Systemic sclerosis, unspecified: Secondary | ICD-10-CM | POA: Diagnosis not present

## 2022-04-24 DIAGNOSIS — R208 Other disturbances of skin sensation: Secondary | ICD-10-CM | POA: Diagnosis not present

## 2022-04-24 DIAGNOSIS — M79643 Pain in unspecified hand: Secondary | ICD-10-CM | POA: Diagnosis not present

## 2022-04-24 DIAGNOSIS — M25641 Stiffness of right hand, not elsewhere classified: Secondary | ICD-10-CM | POA: Diagnosis not present

## 2022-04-24 DIAGNOSIS — M3489 Other systemic sclerosis: Secondary | ICD-10-CM | POA: Diagnosis not present

## 2022-04-26 DIAGNOSIS — M349 Systemic sclerosis, unspecified: Secondary | ICD-10-CM | POA: Diagnosis not present

## 2022-04-26 DIAGNOSIS — R102 Pelvic and perineal pain: Secondary | ICD-10-CM | POA: Diagnosis not present

## 2022-04-26 DIAGNOSIS — M62838 Other muscle spasm: Secondary | ICD-10-CM | POA: Diagnosis not present

## 2022-04-26 DIAGNOSIS — M6281 Muscle weakness (generalized): Secondary | ICD-10-CM | POA: Diagnosis not present

## 2022-05-10 DIAGNOSIS — M6289 Other specified disorders of muscle: Secondary | ICD-10-CM | POA: Diagnosis not present

## 2022-05-10 DIAGNOSIS — M25552 Pain in left hip: Secondary | ICD-10-CM | POA: Diagnosis not present

## 2022-05-10 DIAGNOSIS — R102 Pelvic and perineal pain: Secondary | ICD-10-CM | POA: Diagnosis not present

## 2022-05-10 DIAGNOSIS — M349 Systemic sclerosis, unspecified: Secondary | ICD-10-CM | POA: Diagnosis not present

## 2022-05-10 DIAGNOSIS — M62838 Other muscle spasm: Secondary | ICD-10-CM | POA: Diagnosis not present

## 2022-05-10 DIAGNOSIS — M6281 Muscle weakness (generalized): Secondary | ICD-10-CM | POA: Diagnosis not present

## 2022-05-14 DIAGNOSIS — M349 Systemic sclerosis, unspecified: Secondary | ICD-10-CM | POA: Diagnosis not present

## 2022-05-14 DIAGNOSIS — M3481 Systemic sclerosis with lung involvement: Secondary | ICD-10-CM | POA: Diagnosis not present

## 2022-05-14 DIAGNOSIS — Z79899 Other long term (current) drug therapy: Secondary | ICD-10-CM | POA: Diagnosis not present

## 2022-05-14 DIAGNOSIS — J849 Interstitial pulmonary disease, unspecified: Secondary | ICD-10-CM | POA: Diagnosis not present

## 2022-05-14 DIAGNOSIS — M3502 Sicca syndrome with lung involvement: Secondary | ICD-10-CM | POA: Diagnosis not present

## 2022-05-15 DIAGNOSIS — R600 Localized edema: Secondary | ICD-10-CM | POA: Diagnosis not present

## 2022-05-15 DIAGNOSIS — M25641 Stiffness of right hand, not elsewhere classified: Secondary | ICD-10-CM | POA: Diagnosis not present

## 2022-05-15 DIAGNOSIS — M349 Systemic sclerosis, unspecified: Secondary | ICD-10-CM | POA: Diagnosis not present

## 2022-05-15 DIAGNOSIS — M3489 Other systemic sclerosis: Secondary | ICD-10-CM | POA: Diagnosis not present

## 2022-05-15 DIAGNOSIS — R29898 Other symptoms and signs involving the musculoskeletal system: Secondary | ICD-10-CM | POA: Diagnosis not present

## 2022-05-15 DIAGNOSIS — R208 Other disturbances of skin sensation: Secondary | ICD-10-CM | POA: Diagnosis not present

## 2022-05-15 DIAGNOSIS — M79643 Pain in unspecified hand: Secondary | ICD-10-CM | POA: Diagnosis not present

## 2022-05-15 DIAGNOSIS — M79646 Pain in unspecified finger(s): Secondary | ICD-10-CM | POA: Diagnosis not present

## 2022-05-15 DIAGNOSIS — M25642 Stiffness of left hand, not elsewhere classified: Secondary | ICD-10-CM | POA: Diagnosis not present

## 2022-05-17 DIAGNOSIS — H5203 Hypermetropia, bilateral: Secondary | ICD-10-CM | POA: Diagnosis not present

## 2022-05-17 DIAGNOSIS — H2513 Age-related nuclear cataract, bilateral: Secondary | ICD-10-CM | POA: Diagnosis not present

## 2022-05-17 DIAGNOSIS — H524 Presbyopia: Secondary | ICD-10-CM | POA: Diagnosis not present

## 2022-05-17 DIAGNOSIS — H52223 Regular astigmatism, bilateral: Secondary | ICD-10-CM | POA: Diagnosis not present

## 2022-05-20 DIAGNOSIS — M6289 Other specified disorders of muscle: Secondary | ICD-10-CM | POA: Diagnosis not present

## 2022-05-20 DIAGNOSIS — M25552 Pain in left hip: Secondary | ICD-10-CM | POA: Diagnosis not present

## 2022-05-20 DIAGNOSIS — M6281 Muscle weakness (generalized): Secondary | ICD-10-CM | POA: Diagnosis not present

## 2022-05-20 DIAGNOSIS — M349 Systemic sclerosis, unspecified: Secondary | ICD-10-CM | POA: Diagnosis not present

## 2022-05-20 DIAGNOSIS — R102 Pelvic and perineal pain: Secondary | ICD-10-CM | POA: Diagnosis not present

## 2022-05-21 ENCOUNTER — Other Ambulatory Visit: Payer: Self-pay | Admitting: *Deleted

## 2022-05-21 DIAGNOSIS — Z170 Estrogen receptor positive status [ER+]: Secondary | ICD-10-CM

## 2022-05-22 ENCOUNTER — Other Ambulatory Visit: Payer: Self-pay

## 2022-05-22 ENCOUNTER — Inpatient Hospital Stay: Payer: Medicare Other | Attending: Hematology & Oncology

## 2022-05-22 ENCOUNTER — Inpatient Hospital Stay (HOSPITAL_BASED_OUTPATIENT_CLINIC_OR_DEPARTMENT_OTHER): Payer: Medicare Other | Admitting: Hematology & Oncology

## 2022-05-22 ENCOUNTER — Encounter: Payer: Self-pay | Admitting: Hematology & Oncology

## 2022-05-22 VITALS — BP 124/63 | HR 76 | Temp 97.7°F | Resp 18 | Ht 64.0 in | Wt 156.0 lb

## 2022-05-22 DIAGNOSIS — Z79899 Other long term (current) drug therapy: Secondary | ICD-10-CM | POA: Diagnosis not present

## 2022-05-22 DIAGNOSIS — Z853 Personal history of malignant neoplasm of breast: Secondary | ICD-10-CM | POA: Diagnosis not present

## 2022-05-22 DIAGNOSIS — Z170 Estrogen receptor positive status [ER+]: Secondary | ICD-10-CM

## 2022-05-22 DIAGNOSIS — M349 Systemic sclerosis, unspecified: Secondary | ICD-10-CM | POA: Diagnosis not present

## 2022-05-22 DIAGNOSIS — C50412 Malignant neoplasm of upper-outer quadrant of left female breast: Secondary | ICD-10-CM

## 2022-05-22 DIAGNOSIS — Z923 Personal history of irradiation: Secondary | ICD-10-CM | POA: Insufficient documentation

## 2022-05-22 DIAGNOSIS — Z79890 Hormone replacement therapy (postmenopausal): Secondary | ICD-10-CM | POA: Diagnosis not present

## 2022-05-22 LAB — CMP (CANCER CENTER ONLY)
ALT: 24 U/L (ref 0–44)
AST: 42 U/L — ABNORMAL HIGH (ref 15–41)
Albumin: 4.2 g/dL (ref 3.5–5.0)
Alkaline Phosphatase: 66 U/L (ref 38–126)
Anion gap: 9 (ref 5–15)
BUN: 10 mg/dL (ref 8–23)
CO2: 30 mmol/L (ref 22–32)
Calcium: 9.7 mg/dL (ref 8.9–10.3)
Chloride: 100 mmol/L (ref 98–111)
Creatinine: 1.11 mg/dL — ABNORMAL HIGH (ref 0.44–1.00)
GFR, Estimated: 52 mL/min — ABNORMAL LOW (ref >60)
Glucose, Bld: 116 mg/dL — ABNORMAL HIGH (ref 70–99)
Potassium: 3.5 mmol/L (ref 3.5–5.1)
Sodium: 139 mmol/L (ref 135–145)
Total Bilirubin: 0.4 mg/dL (ref 0.3–1.2)
Total Protein: 7 g/dL (ref 6.5–8.1)

## 2022-05-22 LAB — CBC WITH DIFFERENTIAL (CANCER CENTER ONLY)
Abs Immature Granulocytes: 0.05 10*3/uL (ref 0.00–0.07)
Basophils Absolute: 0 10*3/uL (ref 0.0–0.1)
Basophils Relative: 1 %
Eosinophils Absolute: 0.3 10*3/uL (ref 0.0–0.5)
Eosinophils Relative: 4 %
HCT: 41.2 % (ref 36.0–46.0)
Hemoglobin: 13.2 g/dL (ref 12.0–15.0)
Immature Granulocytes: 1 %
Lymphocytes Relative: 13 %
Lymphs Abs: 1 10*3/uL (ref 0.7–4.0)
MCH: 28.6 pg (ref 26.0–34.0)
MCHC: 32 g/dL (ref 30.0–36.0)
MCV: 89.2 fL (ref 80.0–100.0)
Monocytes Absolute: 0.6 10*3/uL (ref 0.1–1.0)
Monocytes Relative: 8 %
Neutro Abs: 5.5 10*3/uL (ref 1.7–7.7)
Neutrophils Relative %: 73 %
Platelet Count: 255 10*3/uL (ref 150–400)
RBC: 4.62 MIL/uL (ref 3.87–5.11)
RDW: 15.3 % (ref 11.5–15.5)
WBC Count: 7.4 10*3/uL (ref 4.0–10.5)
nRBC: 0 % (ref 0.0–0.2)

## 2022-05-22 LAB — LACTATE DEHYDROGENASE: LDH: 274 U/L — ABNORMAL HIGH (ref 98–192)

## 2022-05-22 NOTE — Progress Notes (Signed)
Hematology and Oncology Follow Up Visit  MIRZA KIDNEY [id number] Nov 16, 1950 71 y.o. 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 Gradel is back for follow-up.  This is her second office visit.  We first saw her back on 02/20/2022.  At that time, there was concern that a mammogram that was done in July showed an area of enhancement in the right breast.  She had a subsequent PET scan that was done.  The PET scan was done on 02/28/2022.  The PET scan did not show any evidence of malignancy.  She then had a diagnostic mammogram that was done on 04/17/2022.  This also did not show any suspicious areas in the right breast.  She sees a specialist of it [facility].  The specialist actually put her on a clinical trial for patients who have had elevated tumor markers and have autoimmune diseases.  When we saw Ms. Lampson, her CA 27.29 was elevated at 56.9.  She feels well.  She does have the scleroderma that is causing her some more problems right now.  She is followed at at [facility].  She has had no obvious change in bowel or bladder habits.  She has had no cough or shortness of breath.  I think she is going to go see a specialist to see about the possibility using acupuncture.  She has seen Dr. [name].  Again, he is holding off on doing any biopsies.  She has had no fever.  There is been no bleeding.  She has had no headache.  Overall, I would say performance status is probably ECOG 1.  Medications:  Current Outpatient Medications:    acetaminophen (TYLENOL) 650 MG CR tablet, Take 650 mg by mouth as needed., Disp: , Rfl:    ELDERBERRY PO, Take by mouth 2 (two) times daily., Disp: , Rfl:    furosemide (LASIX) 20 MG tablet, Take 20 mg by mouth daily., Disp: , Rfl:    levothyroxine  (SYNTHROID) 88 MCG tablet, Take 88 mcg by mouth daily., Disp: , Rfl:    OVER THE COUNTER MEDICATION, Relief Factor, Disp: , Rfl:   Allergies:  Allergies  Allergen Reactions   Betadine [Povidone Iodine] Rash   Contrast Media [Iodinated Contrast Media] Rash    "per pt recently had CT 08/ 2018 even through given pre-medication, IVP still caused rash"   Epinephrine Other (See Comments)    Sever headaches   Iodine Rash and Other (See Comments)   Oxycodone Anxiety   Gabapentin     Other reaction(s): dizziness   Lidocaine Rash   Nickel Rash and Other (See Comments)    Rash and blisters   Other Rash and Other (See Comments)    Hospital gown   Penicillins Rash and Other (See Comments)    Rash and blisters Has patient had a PCN reaction causing immediate rash, facial/tongue/throat swelling, SOB or lightheadedness with hypotension: Yes Has patient had a PCN reaction causing severe rash involving mucus membranes or skin necrosis: No Has patient had a PCN reaction that required hospitalization: No Has patient had a PCN reaction occurring within the last 10 years: No If all of the above answers are "NO", then may proceed with Cephalosporin use.    Sulfa Antibiotics Rash    Past Medical History, Surgical history, Social history, and Family History were  reviewed and updated.  Review of Systems: Review of Systems  Constitutional:  Positive for fatigue.  HENT:  Negative.    Eyes: Negative.   Respiratory: Negative.    Cardiovascular: Negative.   Gastrointestinal: Negative.   Endocrine: Negative.   Genitourinary: Negative.    Musculoskeletal:  Positive for arthralgias and myalgias.  Skin:  Positive for rash.  Neurological: Negative.   Hematological: Negative.   Psychiatric/Behavioral: Negative.      Physical Exam:  height is _0  (1.626 m) and weight is 156 lb (70.8 kg). Her oral temperature is 97.7 F (36.5 C). Her blood pressure is 124/63 and her pulse is 76. Her respiration is 18  and oxygen saturation is 100%.   Wt Readings from Last 3 Encounters:  05/22/22 156 lb (70.8 kg)  03/21/22 158 lb 3.2 oz (71.8 kg)  02/20/22 160 lb (72.6 kg)    Physical Exam Vitals reviewed.  Constitutional:      Comments: Her breast exam shows right breast with no masses, edema or erythema.  There is no right axillary adenopathy.  Left breast shows the lumpectomy scar at about the 2 o'clock position.  There is little firmness at the lumpectomy site.  No distinct masses noted.  There is no left axillary adenopathy.  There is no left nipple discharge.  HENT:     Head: Normocephalic and atraumatic.  Eyes:     Pupils: Pupils are equal, round, and reactive to light.  Cardiovascular:     Rate and Rhythm: Normal rate and regular rhythm.     Heart sounds: Normal heart sounds.  Pulmonary:     Effort: Pulmonary effort is normal.     Breath sounds: Normal breath sounds.  Abdominal:     General: Bowel sounds are normal.     Palpations: Abdomen is soft.  Musculoskeletal:        General: No tenderness or deformity. Normal range of motion.     Cervical back: Normal range of motion.  Lymphadenopathy:     Cervical: No cervical adenopathy.  Skin:    General: Skin is warm and dry.     Findings: No erythema or rash.     Comments: Skin exam shows some changes of the scleroderma.  This is mostly on the face, around the lip area.  Her arms do show a little bit of skin thickening.  Neurological:     Mental Status: She is alert and oriented to person, place, and time.  Psychiatric:        Behavior: Behavior normal.        Thought Content: Thought content normal.        Judgment: Judgment normal.      Lab Results  Component Value Date   WBC 7.4 05/22/2022   HGB 13.2 05/22/2022   HCT 41.2 05/22/2022   MCV 89.2 05/22/2022   PLT 255 05/22/2022     Chemistry      Component Value Date/Time   NA 139 05/22/2022 1147   NA 144 01/27/2020 1148   NA 141 06/09/2017 1117   K 3.5 05/22/2022 1147    K 3.9 06/09/2017 1117   CL 100 05/22/2022 1147   CL 109 (H) 12/14/2012 1253   CO2 30 05/22/2022 1147   CO2 23 06/09/2017 1117   BUN 10 05/22/2022 1147   BUN 11 01/27/2020 1148   BUN 11.5 06/09/2017 1117   CREATININE 1.11 (H) 05/22/2022 1147   CREATININE 1.2 (H) 06/09/2017 1117      Component  Value Date/Time   CALCIUM 9.7 05/22/2022 1147   CALCIUM 8.9 06/09/2017 1117   ALKPHOS 66 05/22/2022 1147   ALKPHOS 40 06/09/2017 1117   AST 42 (H) 05/22/2022 1147   AST 35 (H) 06/09/2017 1117   ALT 24 05/22/2022 1147   ALT 19 06/09/2017 1117   BILITOT 0.4 05/22/2022 1147   BILITOT 0.37 06/09/2017 1117      Impression and Plan: Ms. Spark is a very charming 71 year old white female.  She has had a history of early stage breast cancer.  This was 10 years ago.  As expected, from being a patient of Dr. Virgie Dad, she was treated aggressively.  This certainly has worked.  I do not see any evidence of recurrent disease.  Again I had to suspect that the elevated tumor marker is somehow related to her scleroderma.  It is very interesting that she is now on a clinical trial up at [facility] that looks in patients with autoimmune diseases and tumor markers.  She just had a mammogram done.  It was recommended by the mammographers to had an MRI be done 6 months after her last MRI which was done in July.  We will get 1 set up in January for her.  I will see her back afterwards.   Volanda Napoleon, MD 11/8/20231:07 PM

## 2022-05-23 LAB — CANCER ANTIGEN 27.29: CA 27.29: 63.5 U/mL — ABNORMAL HIGH (ref 0.0–38.6)

## 2022-06-03 DIAGNOSIS — R3982 Chronic bladder pain: Secondary | ICD-10-CM | POA: Diagnosis not present

## 2022-06-03 DIAGNOSIS — M25552 Pain in left hip: Secondary | ICD-10-CM | POA: Diagnosis not present

## 2022-06-03 DIAGNOSIS — M6281 Muscle weakness (generalized): Secondary | ICD-10-CM | POA: Diagnosis not present

## 2022-06-03 DIAGNOSIS — M62838 Other muscle spasm: Secondary | ICD-10-CM | POA: Diagnosis not present

## 2022-06-03 DIAGNOSIS — R102 Pelvic and perineal pain: Secondary | ICD-10-CM | POA: Diagnosis not present

## 2022-06-04 DIAGNOSIS — M25641 Stiffness of right hand, not elsewhere classified: Secondary | ICD-10-CM | POA: Diagnosis not present

## 2022-06-04 DIAGNOSIS — M25642 Stiffness of left hand, not elsewhere classified: Secondary | ICD-10-CM | POA: Diagnosis not present

## 2022-06-11 DIAGNOSIS — E663 Overweight: Secondary | ICD-10-CM | POA: Diagnosis not present

## 2022-06-11 DIAGNOSIS — L821 Other seborrheic keratosis: Secondary | ICD-10-CM | POA: Diagnosis not present

## 2022-06-11 DIAGNOSIS — L578 Other skin changes due to chronic exposure to nonionizing radiation: Secondary | ICD-10-CM | POA: Diagnosis not present

## 2022-06-11 DIAGNOSIS — L814 Other melanin hyperpigmentation: Secondary | ICD-10-CM | POA: Diagnosis not present

## 2022-06-11 DIAGNOSIS — L570 Actinic keratosis: Secondary | ICD-10-CM | POA: Diagnosis not present

## 2022-06-11 DIAGNOSIS — M349 Systemic sclerosis, unspecified: Secondary | ICD-10-CM | POA: Diagnosis not present

## 2022-06-17 DIAGNOSIS — M25552 Pain in left hip: Secondary | ICD-10-CM | POA: Diagnosis not present

## 2022-06-17 DIAGNOSIS — M6281 Muscle weakness (generalized): Secondary | ICD-10-CM | POA: Diagnosis not present

## 2022-06-17 DIAGNOSIS — M349 Systemic sclerosis, unspecified: Secondary | ICD-10-CM | POA: Diagnosis not present

## 2022-06-17 DIAGNOSIS — M62838 Other muscle spasm: Secondary | ICD-10-CM | POA: Diagnosis not present

## 2022-06-17 DIAGNOSIS — R102 Pelvic and perineal pain: Secondary | ICD-10-CM | POA: Diagnosis not present

## 2022-06-25 DIAGNOSIS — M349 Systemic sclerosis, unspecified: Secondary | ICD-10-CM | POA: Diagnosis not present

## 2022-06-25 DIAGNOSIS — R29898 Other symptoms and signs involving the musculoskeletal system: Secondary | ICD-10-CM | POA: Diagnosis not present

## 2022-06-25 DIAGNOSIS — R600 Localized edema: Secondary | ICD-10-CM | POA: Diagnosis not present

## 2022-06-25 DIAGNOSIS — R208 Other disturbances of skin sensation: Secondary | ICD-10-CM | POA: Diagnosis not present

## 2022-06-25 DIAGNOSIS — M79646 Pain in unspecified finger(s): Secondary | ICD-10-CM | POA: Diagnosis not present

## 2022-06-25 DIAGNOSIS — M79643 Pain in unspecified hand: Secondary | ICD-10-CM | POA: Diagnosis not present

## 2022-06-25 DIAGNOSIS — M25641 Stiffness of right hand, not elsewhere classified: Secondary | ICD-10-CM | POA: Diagnosis not present

## 2022-06-25 DIAGNOSIS — M25642 Stiffness of left hand, not elsewhere classified: Secondary | ICD-10-CM | POA: Diagnosis not present

## 2022-06-25 DIAGNOSIS — M3489 Other systemic sclerosis: Secondary | ICD-10-CM | POA: Diagnosis not present

## 2022-07-01 DIAGNOSIS — M25552 Pain in left hip: Secondary | ICD-10-CM | POA: Diagnosis not present

## 2022-07-01 DIAGNOSIS — R3982 Chronic bladder pain: Secondary | ICD-10-CM | POA: Diagnosis not present

## 2022-07-01 DIAGNOSIS — M6289 Other specified disorders of muscle: Secondary | ICD-10-CM | POA: Diagnosis not present

## 2022-07-01 DIAGNOSIS — R102 Pelvic and perineal pain: Secondary | ICD-10-CM | POA: Diagnosis not present

## 2022-07-01 DIAGNOSIS — M6281 Muscle weakness (generalized): Secondary | ICD-10-CM | POA: Diagnosis not present

## 2022-07-01 DIAGNOSIS — M62838 Other muscle spasm: Secondary | ICD-10-CM | POA: Diagnosis not present

## 2022-07-03 DIAGNOSIS — M25642 Stiffness of left hand, not elsewhere classified: Secondary | ICD-10-CM | POA: Diagnosis not present

## 2022-07-03 DIAGNOSIS — M79643 Pain in unspecified hand: Secondary | ICD-10-CM | POA: Diagnosis not present

## 2022-07-03 DIAGNOSIS — M3489 Other systemic sclerosis: Secondary | ICD-10-CM | POA: Diagnosis not present

## 2022-07-03 DIAGNOSIS — R600 Localized edema: Secondary | ICD-10-CM | POA: Diagnosis not present

## 2022-07-03 DIAGNOSIS — M349 Systemic sclerosis, unspecified: Secondary | ICD-10-CM | POA: Diagnosis not present

## 2022-07-03 DIAGNOSIS — R29898 Other symptoms and signs involving the musculoskeletal system: Secondary | ICD-10-CM | POA: Diagnosis not present

## 2022-07-03 DIAGNOSIS — M25641 Stiffness of right hand, not elsewhere classified: Secondary | ICD-10-CM | POA: Diagnosis not present

## 2022-07-03 DIAGNOSIS — M79646 Pain in unspecified finger(s): Secondary | ICD-10-CM | POA: Diagnosis not present

## 2022-07-03 DIAGNOSIS — R208 Other disturbances of skin sensation: Secondary | ICD-10-CM | POA: Diagnosis not present

## 2022-07-10 ENCOUNTER — Encounter: Payer: Self-pay | Admitting: Family Medicine

## 2022-07-10 DIAGNOSIS — E039 Hypothyroidism, unspecified: Secondary | ICD-10-CM

## 2022-07-10 MED ORDER — LEVOTHYROXINE SODIUM 88 MCG PO TABS: 88.0000 ug | ORAL_TABLET | Freq: Every day | ORAL | 3 refills | Status: DC

## 2022-07-11 NOTE — Addendum Note (Signed)
Addended by: Lamar Blinks C on: 07/11/2022 09:00 AM   Modules accepted: Orders

## 2022-07-16 DIAGNOSIS — R2681 Unsteadiness on feet: Secondary | ICD-10-CM | POA: Diagnosis not present

## 2022-07-16 DIAGNOSIS — M62838 Other muscle spasm: Secondary | ICD-10-CM | POA: Diagnosis not present

## 2022-07-16 DIAGNOSIS — M25551 Pain in right hip: Secondary | ICD-10-CM | POA: Diagnosis not present

## 2022-07-16 DIAGNOSIS — M6281 Muscle weakness (generalized): Secondary | ICD-10-CM | POA: Diagnosis not present

## 2022-07-17 ENCOUNTER — Other Ambulatory Visit (INDEPENDENT_AMBULATORY_CARE_PROVIDER_SITE_OTHER): Payer: Medicare Other

## 2022-07-17 ENCOUNTER — Encounter: Payer: Self-pay | Admitting: Family Medicine

## 2022-07-17 DIAGNOSIS — E039 Hypothyroidism, unspecified: Secondary | ICD-10-CM

## 2022-07-17 DIAGNOSIS — M349 Systemic sclerosis, unspecified: Secondary | ICD-10-CM

## 2022-07-17 LAB — TSH: TSH: 13.27 u[IU]/mL — ABNORMAL HIGH (ref 0.35–5.50)

## 2022-07-18 MED ORDER — SYNTHROID 100 MCG PO TABS: 100.0000 ug | ORAL_TABLET | Freq: Every day | ORAL | 1 refills | Status: DC

## 2022-07-24 NOTE — Addendum Note (Signed)
Addended by: Lamar Blinks C on: 07/24/2022 12:46 PM   Modules accepted: Orders

## 2022-07-25 ENCOUNTER — Ambulatory Visit (HOSPITAL_COMMUNITY)
Admission: RE | Admit: 2022-07-25 | Discharge: 2022-07-25 | Disposition: A | Discharge status: Home / Self Care | Payer: Medicare Other | Source: Ambulatory Visit | Attending: Hematology & Oncology | Admitting: Hematology & Oncology

## 2022-07-25 DIAGNOSIS — Z1239 Encounter for other screening for malignant neoplasm of breast: Secondary | ICD-10-CM | POA: Diagnosis not present

## 2022-07-25 DIAGNOSIS — Z170 Estrogen receptor positive status [ER+]: Secondary | ICD-10-CM

## 2022-07-25 DIAGNOSIS — C50412 Malignant neoplasm of upper-outer quadrant of left female breast: Secondary | ICD-10-CM | POA: Insufficient documentation

## 2022-07-25 MED ORDER — GADOBUTROL 1 MMOL/ML IV SOLN
7.0000 mL | Freq: Once | INTRAVENOUS | Status: AC | PRN
  Administered 2022-07-25: 7 mL via INTRAVENOUS

## 2022-07-26 ENCOUNTER — Encounter: Payer: Self-pay | Admitting: *Deleted

## 2022-07-31 DIAGNOSIS — R29898 Other symptoms and signs involving the musculoskeletal system: Secondary | ICD-10-CM | POA: Diagnosis not present

## 2022-07-31 DIAGNOSIS — M25642 Stiffness of left hand, not elsewhere classified: Secondary | ICD-10-CM | POA: Diagnosis not present

## 2022-07-31 DIAGNOSIS — R208 Other disturbances of skin sensation: Secondary | ICD-10-CM | POA: Diagnosis not present

## 2022-07-31 DIAGNOSIS — M25641 Stiffness of right hand, not elsewhere classified: Secondary | ICD-10-CM | POA: Diagnosis not present

## 2022-07-31 DIAGNOSIS — R600 Localized edema: Secondary | ICD-10-CM | POA: Diagnosis not present

## 2022-07-31 DIAGNOSIS — M79646 Pain in unspecified finger(s): Secondary | ICD-10-CM | POA: Diagnosis not present

## 2022-07-31 DIAGNOSIS — M349 Systemic sclerosis, unspecified: Secondary | ICD-10-CM | POA: Diagnosis not present

## 2022-07-31 DIAGNOSIS — M79643 Pain in unspecified hand: Secondary | ICD-10-CM | POA: Diagnosis not present

## 2022-07-31 DIAGNOSIS — M3489 Other systemic sclerosis: Secondary | ICD-10-CM | POA: Diagnosis not present

## 2022-08-06 ENCOUNTER — Ambulatory Visit (INDEPENDENT_AMBULATORY_CARE_PROVIDER_SITE_OTHER): Payer: Medicare Other | Admitting: Gastroenterology

## 2022-08-06 ENCOUNTER — Encounter: Payer: Self-pay | Admitting: Gastroenterology

## 2022-08-06 VITALS — BP 140/84 | HR 90 | Ht 64.5 in | Wt 156.0 lb

## 2022-08-06 DIAGNOSIS — R1310 Dysphagia, unspecified: Secondary | ICD-10-CM | POA: Diagnosis not present

## 2022-08-06 DIAGNOSIS — M349 Systemic sclerosis, unspecified: Secondary | ICD-10-CM | POA: Diagnosis not present

## 2022-08-06 NOTE — Patient Instructions (Signed)
You have been scheduled for an endoscopy. Please follow written instructions given to you at your visit today. If you use inhalers (even only as needed), please bring them with you on the day of your procedure.  _______________________________________________________  If your blood pressure at your visit was 140/90 or greater, please contact your primary care physician to follow up on this.  _______________________________________________________  If you are age 74 or older, your body mass index should be between 23-30. Your Body mass index is 26.36 kg/m. If this is out of the aforementioned range listed, please consider follow up with your Primary Care Provider.  If you are age 27 or younger, your body mass index should be between 19-25. Your Body mass index is 26.36 kg/m. If this is out of the aformentioned range listed, please consider follow up with your Primary Care Provider.   ________________________________________________________  The [facility] providers would like to encourage you to use [facility] to communicate with providers for non-urgent requests or questions.  Due to long hold times on the telephone, sending your provider a message by [facility] may be a faster and more efficient way to get a response.  Please allow 48 business hours for a response.  Please remember that this is for non-urgent requests.  _______________________________________________________

## 2022-08-06 NOTE — Progress Notes (Signed)
08/06/2022 [name] [id number] Nov 06, 1950   HISTORY OF PRESENT ILLNESS: This is a 72 year old female who is a patient of Dr. Vena Rua.  She has scleroderma.  Follows at [facility] for that and has an appointment with them in the next few weeks.  Follows here for issues with GERD and esophagitis.  Last seen in December 2021 at which time they decided that she could probably come off her PPI and just do Pepcid as needed.  She had grade C esophagitis, but follow-up EGD last in February 2020 showed resolution of esophagitis on PPI therapy.  She is here today with complaints of dysphagia.  She describes mostly dysphagia to pills and sometimes liquids high in her esophagus or throat area.  She says that it is worse if she has a lot of mucus.  Does not really describe a lot of foods getting hung up, but does have to be careful to take small bites, chew well, take drinks between bites, etc.  This morning she had a little bit of trouble with a bagel.  Her husband thinks she needs her esophagus stretched.  Does not really seem to be having any issues with acid reflux per se.  Says that overall symptoms worsened over the last 6 months, more so over the last few weeks.   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 Ruthann Cancer at [facility]   systemic sclerosis , diffuse/   rheumotologist:  dr Manuella Ghazi at [facility]---  lov 07-22-2017 (as of 07-25-2017 note not available to read)---  per pt has appt. w/ specialist at [facility]   Skin thickening    hands, arms, legs  caused by scleroderma   Systemic sclerosis ([place])    UTI (urinary tract infection)    Past Surgical History:  Procedure Laterality Date   BIOPSY  01/29/2018   Procedure: BIOPSY;  Surgeon: Jerene Bears, MD;  Location: [facility];  Service: Gastroenterology;;   Lillard Anes  2000   Left   COLONOSCOPY WITH PROPOFOL N/A 01/29/2018   Procedure: COLONOSCOPY WITH PROPOFOL;  Surgeon: Jerene Bears, MD;  Location: [facility];  Service: Gastroenterology;  Laterality: N/A;   DILATATION & CURETTAGE/HYSTEROSCOPY WITH MYOSURE N/A 08/04/2017   Procedure: DILATATION & CURETTAGE/HYSTEROSCOPY WITH MYOSURE;  Surgeon: Megan Salon, MD;  Location: [facility];  Service: Gynecology;  Laterality: N/A;   ECTOPIC PREGNANCY SURGERY  1986-88  s/p unilateral salpingectomy   ESOPHAGOGASTRODUODENOSCOPY (EGD) WITH PROPOFOL N/A 01/29/2018   Procedure: ESOPHAGOGASTRODUODENOSCOPY (EGD) WITH PROPOFOL;  Surgeon: Jerene Bears, MD;  Location: [facility];  Service: Gastroenterology;  Laterality: N/A;   PARTIAL MASTECTOMY WITH AXILLARY SENTINEL LYMPH NODE BIOPSY Left 11-04-2011   dr Margot Chimes  [facility]   TRANSTHORACIC ECHOCARDIOGRAM  06-04-2017    [facility]   moderate LVH, ef>55%/  trivial AR and TR/ mild MR and PR/  trivial pericardial effusion   WISDOM TOOTH EXTRACTION      reports that she has never smoked. She has never used smokeless tobacco. She reports current alcohol use. She reports that she does not use  drugs. family history includes Anemia in her sister; Arrhythmia in her sister; Autoimmune disease in her sister; CAD in her brother; Heart disease in her father, mother, and paternal uncle; Heart failure in her mother; Ovarian cancer in her maternal aunt. Allergies  Allergen Reactions   Betadine [Povidone Iodine] Rash   Contrast Media [Iodinated Contrast Media] Rash    "per pt recently had CT 08/ 2018 even through given pre-medication, IVP still caused rash"   Epinephrine Other (See Comments)    Sever headaches   Iodine Rash and Other (See Comments)   Oxycodone Anxiety   Gabapentin     Other reaction(s): dizziness   Lidocaine Rash   Nickel Rash and Other (See Comments)    Rash and blisters   Other Rash and Other (See Comments)    Hospital gown   Penicillins Rash and Other (See Comments)    Rash and blisters Has patient had a PCN reaction causing immediate rash, facial/tongue/throat swelling, SOB or lightheadedness with hypotension: Yes Has patient had a PCN reaction causing severe rash involving mucus membranes or skin necrosis: No Has patient had a PCN reaction that required hospitalization: No Has patient had a PCN reaction occurring within the last 10 years: No If all of the above answers are "NO", then may proceed with Cephalosporin use.    Sulfa Antibiotics Rash      Outpatient Encounter Medications as of 08/06/2022  Medication Sig   acetaminophen (TYLENOL) 650 MG CR tablet Take 650 mg by mouth as needed.   ELDERBERRY PO Take by mouth 2 (two) times daily.   furosemide (LASIX) 20 MG tablet Take 20 mg by mouth daily.   OVER THE COUNTER MEDICATION Relief Factor   SYNTHROID 100 MCG tablet Take 1 tablet (100 mcg total) by mouth daily before breakfast.   [DISCONTINUED] levothyroxine (SYNTHROID) 88 MCG tablet Take 1 tablet (88 mcg total) by mouth daily.   No facility-administered encounter medications on file as of 08/06/2022.     REVIEW OF SYSTEMS  : All other systems reviewed  and negative except where noted in the History of Present Illness.   PHYSICAL EXAM: BP (!) 140/84   Pulse 90   Ht 5' 4.5" (1.638 m)   Wt 156 lb (70.8 kg)   BMI 26.36 kg/m  General: Well developed white female in no acute distress Head: Normocephalic and atraumatic Eyes:  Sclerae anicteric, conjunctiva pink. Ears: Normal auditory acuity Lungs: Clear throughout to auscultation; no W/R/R. Heart: Regular rate and rhythm; no M/R/G. Abdomen: Soft, non-distended.  BS present.  Non-tender. Musculoskeletal: Symmetrical with no gross deformities  Skin: No lesions on visible extremities Extremities: No edema  Neurological: Alert oriented x 4, grossly non-focal Psychological:  Alert and cooperative. Normal mood and affect  ASSESSMENT AND PLAN: *Dysphagia: Describing dysphagia sounds like mostly to pills and sometimes  liquids high in her esophagus or throat area.  Describes having more issues when she has a lot of mucus.  Not really describing any dysphagia to solid foods or food getting hung in her chest.  She has scleroderma and has an appointment at [facility] where she follows next month.  Will plan for EGD with possible dilation with Dr. Silverio Decamp.  The risks, benefits, and alternatives to EGD with dilation were discussed with the patient and she consents to proceed.   CC:  Copland, Gay Filler, MD

## 2022-08-09 ENCOUNTER — Emergency Department (HOSPITAL_BASED_OUTPATIENT_CLINIC_OR_DEPARTMENT_OTHER): Payer: Medicare Other

## 2022-08-09 ENCOUNTER — Other Ambulatory Visit: Payer: Self-pay

## 2022-08-09 ENCOUNTER — Encounter (HOSPITAL_COMMUNITY): Payer: Self-pay

## 2022-08-09 ENCOUNTER — Inpatient Hospital Stay (HOSPITAL_BASED_OUTPATIENT_CLINIC_OR_DEPARTMENT_OTHER)
Admission: EM | Admit: 2022-08-09 | Discharge: 2022-08-10 | DRG: 545 | Disposition: A | Discharge status: Other Acute Inpatient Hospital | Payer: Medicare Other | Attending: Family Medicine | Admitting: Family Medicine

## 2022-08-09 ENCOUNTER — Encounter (HOSPITAL_BASED_OUTPATIENT_CLINIC_OR_DEPARTMENT_OTHER): Payer: Self-pay | Admitting: Emergency Medicine

## 2022-08-09 ENCOUNTER — Telehealth: Payer: Self-pay | Admitting: Family Medicine

## 2022-08-09 DIAGNOSIS — R0600 Dyspnea, unspecified: Secondary | ICD-10-CM

## 2022-08-09 DIAGNOSIS — Z853 Personal history of malignant neoplasm of breast: Secondary | ICD-10-CM

## 2022-08-09 DIAGNOSIS — R1310 Dysphagia, unspecified: Secondary | ICD-10-CM | POA: Diagnosis not present

## 2022-08-09 DIAGNOSIS — Z880 Allergy status to penicillin: Secondary | ICD-10-CM | POA: Diagnosis not present

## 2022-08-09 DIAGNOSIS — E871 Hypo-osmolality and hyponatremia: Secondary | ICD-10-CM | POA: Diagnosis present

## 2022-08-09 DIAGNOSIS — Z79899 Other long term (current) drug therapy: Secondary | ICD-10-CM | POA: Diagnosis not present

## 2022-08-09 DIAGNOSIS — R0602 Shortness of breath: Secondary | ICD-10-CM | POA: Diagnosis not present

## 2022-08-09 DIAGNOSIS — I5031 Acute diastolic (congestive) heart failure: Secondary | ICD-10-CM | POA: Diagnosis present

## 2022-08-09 DIAGNOSIS — Z91041 Radiographic dye allergy status: Secondary | ICD-10-CM | POA: Diagnosis not present

## 2022-08-09 DIAGNOSIS — R519 Headache, unspecified: Secondary | ICD-10-CM | POA: Diagnosis present

## 2022-08-09 DIAGNOSIS — Z882 Allergy status to sulfonamides status: Secondary | ICD-10-CM | POA: Diagnosis not present

## 2022-08-09 DIAGNOSIS — M8580 Other specified disorders of bone density and structure, unspecified site: Secondary | ICD-10-CM | POA: Diagnosis present

## 2022-08-09 DIAGNOSIS — I7300 Raynaud's syndrome without gangrene: Secondary | ICD-10-CM | POA: Diagnosis present

## 2022-08-09 DIAGNOSIS — Z1152 Encounter for screening for COVID-19: Secondary | ICD-10-CM

## 2022-08-09 DIAGNOSIS — Z884 Allergy status to anesthetic agent status: Secondary | ICD-10-CM | POA: Diagnosis not present

## 2022-08-09 DIAGNOSIS — I160 Hypertensive urgency: Principal | ICD-10-CM | POA: Diagnosis present

## 2022-08-09 DIAGNOSIS — M349 Systemic sclerosis, unspecified: Secondary | ICD-10-CM

## 2022-08-09 DIAGNOSIS — I10 Essential (primary) hypertension: Secondary | ICD-10-CM | POA: Diagnosis not present

## 2022-08-09 DIAGNOSIS — C50412 Malignant neoplasm of upper-outer quadrant of left female breast: Secondary | ICD-10-CM | POA: Diagnosis not present

## 2022-08-09 DIAGNOSIS — R0609 Other forms of dyspnea: Secondary | ICD-10-CM

## 2022-08-09 DIAGNOSIS — M3481 Systemic sclerosis with lung involvement: Secondary | ICD-10-CM | POA: Diagnosis not present

## 2022-08-09 DIAGNOSIS — I3139 Other pericardial effusion (noninflammatory): Secondary | ICD-10-CM | POA: Diagnosis present

## 2022-08-09 DIAGNOSIS — E876 Hypokalemia: Secondary | ICD-10-CM | POA: Diagnosis not present

## 2022-08-09 DIAGNOSIS — Z885 Allergy status to narcotic agent status: Secondary | ICD-10-CM

## 2022-08-09 DIAGNOSIS — J9811 Atelectasis: Secondary | ICD-10-CM | POA: Diagnosis not present

## 2022-08-09 DIAGNOSIS — Z888 Allergy status to other drugs, medicaments and biological substances status: Secondary | ICD-10-CM

## 2022-08-09 DIAGNOSIS — Z923 Personal history of irradiation: Secondary | ICD-10-CM

## 2022-08-09 DIAGNOSIS — N179 Acute kidney failure, unspecified: Secondary | ICD-10-CM | POA: Diagnosis not present

## 2022-08-09 DIAGNOSIS — Z79890 Hormone replacement therapy (postmenopausal): Secondary | ICD-10-CM | POA: Diagnosis not present

## 2022-08-09 DIAGNOSIS — J90 Pleural effusion, not elsewhere classified: Secondary | ICD-10-CM | POA: Diagnosis not present

## 2022-08-09 DIAGNOSIS — J690 Pneumonitis due to inhalation of food and vomit: Secondary | ICD-10-CM | POA: Diagnosis not present

## 2022-08-09 DIAGNOSIS — I130 Hypertensive heart and chronic kidney disease with heart failure and stage 1 through stage 4 chronic kidney disease, or unspecified chronic kidney disease: Secondary | ICD-10-CM | POA: Diagnosis present

## 2022-08-09 DIAGNOSIS — M3489 Other systemic sclerosis: Secondary | ICD-10-CM | POA: Diagnosis not present

## 2022-08-09 DIAGNOSIS — Z9221 Personal history of antineoplastic chemotherapy: Secondary | ICD-10-CM | POA: Diagnosis not present

## 2022-08-09 DIAGNOSIS — D696 Thrombocytopenia, unspecified: Secondary | ICD-10-CM | POA: Diagnosis not present

## 2022-08-09 DIAGNOSIS — I129 Hypertensive chronic kidney disease with stage 1 through stage 4 chronic kidney disease, or unspecified chronic kidney disease: Secondary | ICD-10-CM | POA: Diagnosis not present

## 2022-08-09 DIAGNOSIS — D649 Anemia, unspecified: Secondary | ICD-10-CM | POA: Diagnosis not present

## 2022-08-09 DIAGNOSIS — E039 Hypothyroidism, unspecified: Secondary | ICD-10-CM | POA: Diagnosis present

## 2022-08-09 DIAGNOSIS — Z8041 Family history of malignant neoplasm of ovary: Secondary | ICD-10-CM

## 2022-08-09 DIAGNOSIS — J8410 Pulmonary fibrosis, unspecified: Secondary | ICD-10-CM | POA: Diagnosis not present

## 2022-08-09 DIAGNOSIS — E038 Other specified hypothyroidism: Secondary | ICD-10-CM | POA: Diagnosis not present

## 2022-08-09 DIAGNOSIS — N280 Ischemia and infarction of kidney: Secondary | ICD-10-CM | POA: Diagnosis not present

## 2022-08-09 DIAGNOSIS — J189 Pneumonia, unspecified organism: Secondary | ICD-10-CM | POA: Diagnosis not present

## 2022-08-09 DIAGNOSIS — Z170 Estrogen receptor positive status [ER+]: Secondary | ICD-10-CM

## 2022-08-09 DIAGNOSIS — R42 Dizziness and giddiness: Secondary | ICD-10-CM | POA: Diagnosis not present

## 2022-08-09 DIAGNOSIS — N1831 Chronic kidney disease, stage 3a: Secondary | ICD-10-CM | POA: Diagnosis not present

## 2022-08-09 DIAGNOSIS — Z8719 Personal history of other diseases of the digestive system: Secondary | ICD-10-CM

## 2022-08-09 DIAGNOSIS — Z883 Allergy status to other anti-infective agents status: Secondary | ICD-10-CM | POA: Diagnosis not present

## 2022-08-09 DIAGNOSIS — D594 Other nonautoimmune hemolytic anemias: Secondary | ICD-10-CM | POA: Diagnosis not present

## 2022-08-09 DIAGNOSIS — Z8249 Family history of ischemic heart disease and other diseases of the circulatory system: Secondary | ICD-10-CM

## 2022-08-09 DIAGNOSIS — N189 Chronic kidney disease, unspecified: Secondary | ICD-10-CM | POA: Diagnosis not present

## 2022-08-09 LAB — CBC WITH DIFFERENTIAL/PLATELET
Abs Immature Granulocytes: 0.03 10*3/uL (ref 0.00–0.07)
Basophils Absolute: 0.1 10*3/uL (ref 0.0–0.1)
Basophils Relative: 1 %
Eosinophils Absolute: 0.3 10*3/uL (ref 0.0–0.5)
Eosinophils Relative: 3 %
HCT: 37.4 % (ref 36.0–46.0)
Hemoglobin: 12.7 g/dL (ref 12.0–15.0)
Immature Granulocytes: 0 %
Lymphocytes Relative: 10 %
Lymphs Abs: 0.9 10*3/uL (ref 0.7–4.0)
MCH: 29.1 pg (ref 26.0–34.0)
MCHC: 34 g/dL (ref 30.0–36.0)
MCV: 85.6 fL (ref 80.0–100.0)
Monocytes Absolute: 0.7 10*3/uL (ref 0.1–1.0)
Monocytes Relative: 7 %
Neutro Abs: 7.7 10*3/uL (ref 1.7–7.7)
Neutrophils Relative %: 79 %
Platelets: 125 10*3/uL — ABNORMAL LOW (ref 150–400)
RBC: 4.37 MIL/uL (ref 3.87–5.11)
RDW: 15.9 % — ABNORMAL HIGH (ref 11.5–15.5)
WBC: 9.6 10*3/uL (ref 4.0–10.5)
nRBC: 0 % (ref 0.0–0.2)

## 2022-08-09 LAB — COMPREHENSIVE METABOLIC PANEL
ALT: 23 U/L (ref 0–44)
AST: 47 U/L — ABNORMAL HIGH (ref 15–41)
Albumin: 3.7 g/dL (ref 3.5–5.0)
Alkaline Phosphatase: 64 U/L (ref 38–126)
Anion gap: 9 (ref 5–15)
BUN: 17 mg/dL (ref 8–23)
CO2: 24 mmol/L (ref 22–32)
Calcium: 8.7 mg/dL — ABNORMAL LOW (ref 8.9–10.3)
Chloride: 98 mmol/L (ref 98–111)
Creatinine, Ser: 1.8 mg/dL — ABNORMAL HIGH (ref 0.44–1.00)
GFR, Estimated: 29 mL/min — ABNORMAL LOW (ref >60)
Glucose, Bld: 113 mg/dL — ABNORMAL HIGH (ref 70–99)
Potassium: 3 mmol/L — ABNORMAL LOW (ref 3.5–5.1)
Sodium: 131 mmol/L — ABNORMAL LOW (ref 135–145)
Total Bilirubin: 1.4 mg/dL — ABNORMAL HIGH (ref 0.3–1.2)
Total Protein: 6.9 g/dL (ref 6.5–8.1)

## 2022-08-09 LAB — URINALYSIS, ROUTINE W REFLEX MICROSCOPIC
Bilirubin Urine: NEGATIVE
Glucose, UA: NEGATIVE mg/dL
Ketones, ur: NEGATIVE mg/dL
Nitrite: NEGATIVE
Protein, ur: 30 mg/dL — AB
Specific Gravity, Urine: <=1.005 (ref 1.005–1.030)
pH: 6 (ref 5.0–8.0)

## 2022-08-09 LAB — RESP PANEL BY RT-PCR (RSV, FLU A&B, COVID)  RVPGX2
Influenza A by PCR: NEGATIVE
Influenza B by PCR: NEGATIVE
Resp Syncytial Virus by PCR: NEGATIVE
SARS Coronavirus 2 by RT PCR: NEGATIVE

## 2022-08-09 LAB — URINALYSIS, MICROSCOPIC (REFLEX)

## 2022-08-09 LAB — MAGNESIUM: Magnesium: 2.1 mg/dL (ref 1.7–2.4)

## 2022-08-09 MED ORDER — LABETALOL HCL 5 MG/ML IV SOLN
10.0000 mg | Freq: Once | INTRAVENOUS | Status: AC
  Administered 2022-08-09: 10 mg via INTRAVENOUS
  Filled 2022-08-09: qty 4

## 2022-08-09 MED ORDER — HYDRALAZINE HCL 20 MG/ML IJ SOLN
10.0000 mg | INTRAMUSCULAR | Status: DC | PRN
  Administered 2022-08-09 – 2022-08-10 (×3): 10 mg via INTRAVENOUS
  Filled 2022-08-09 (×3): qty 1

## 2022-08-09 MED ORDER — HYDRALAZINE HCL 20 MG/ML IJ SOLN
10.0000 mg | Freq: Once | INTRAMUSCULAR | Status: AC
  Administered 2022-08-09: 10 mg via INTRAVENOUS
  Filled 2022-08-09: qty 1

## 2022-08-09 MED ORDER — AMLODIPINE BESYLATE 5 MG PO TABS
5.0000 mg | ORAL_TABLET | Freq: Once | ORAL | Status: AC
  Administered 2022-08-09: 5 mg via ORAL
  Filled 2022-08-09: qty 1

## 2022-08-09 MED ORDER — CALCIUM CARBONATE ANTACID 500 MG PO CHEW
1.0000 | CHEWABLE_TABLET | Freq: Once | ORAL | Status: AC
  Administered 2022-08-10: 200 mg via ORAL
  Filled 2022-08-09: qty 1

## 2022-08-09 MED ORDER — ENOXAPARIN SODIUM 30 MG/0.3ML IJ SOSY
30.0000 mg | PREFILLED_SYRINGE | Freq: Every day | INTRAMUSCULAR | Status: DC
  Administered 2022-08-09: 30 mg via SUBCUTANEOUS
  Filled 2022-08-09: qty 0.3

## 2022-08-09 MED ORDER — ACETAMINOPHEN 325 MG PO TABS: 650.0000 mg | ORAL_TABLET | Freq: Four times a day (QID) | ORAL | Status: DC | PRN

## 2022-08-09 MED ORDER — LEVOFLOXACIN IN D5W 750 MG/150ML IV SOLN
750.0000 mg | Freq: Once | INTRAVENOUS | Status: AC
  Administered 2022-08-09: 750 mg via INTRAVENOUS
  Filled 2022-08-09: qty 150

## 2022-08-09 MED ORDER — LEVOFLOXACIN IN D5W 750 MG/150ML IV SOLN: 750.0000 mg | INTRAVENOUS | Status: DC

## 2022-08-09 MED ORDER — AMLODIPINE BESYLATE 5 MG PO TABS
5.0000 mg | ORAL_TABLET | Freq: Every day | ORAL | Status: DC
  Administered 2022-08-10: 5 mg via ORAL
  Filled 2022-08-09: qty 1

## 2022-08-09 MED ORDER — LABETALOL HCL 5 MG/ML IV SOLN
10.0000 mg | Freq: Once | INTRAVENOUS | Status: AC
  Administered 2022-08-09: 10 mg via INTRAVENOUS

## 2022-08-09 MED ORDER — POTASSIUM CHLORIDE 10 MEQ/100ML IV SOLN
10.0000 meq | INTRAVENOUS | Status: AC
  Administered 2022-08-09 (×2): 10 meq via INTRAVENOUS
  Filled 2022-08-09 (×2): qty 100

## 2022-08-09 MED ORDER — LEVOTHYROXINE SODIUM 100 MCG PO TABS
100.0000 ug | ORAL_TABLET | Freq: Every day | ORAL | Status: DC
  Administered 2022-08-10: 100 ug via ORAL
  Filled 2022-08-09: qty 1

## 2022-08-09 NOTE — ED Triage Notes (Addendum)
Woke up this ,  took her bp and  it was high  225/ 117  not on any bp meds she states , has had trouble swallowing all week and is going to have endoscopy  on Tuesday , has other hx issues she states has been dizzy the last few days has had a cough and a h/a some nausea, denies cp

## 2022-08-09 NOTE — Assessment & Plan Note (Addendum)
-  unclear etiology although likely related to her systemic sclerosis and cardiomegaly seen on CXR -continue IV '10mg'$  hydralazine PRN to goal to normalize to <140/80 over the next 24 hours -continue amlodipine '5mg'$

## 2022-08-09 NOTE — ED Notes (Signed)
Pt ambulatory to BR to have BM, unable to urinate

## 2022-08-09 NOTE — Assessment & Plan Note (Signed)
TSH elevated - Continue home Synthroid - Check fT4

## 2022-08-09 NOTE — Assessment & Plan Note (Signed)
This worsened markedly in the last few weeks, and patient already had EGD planned for next Tuesday with [facility]

## 2022-08-09 NOTE — Assessment & Plan Note (Signed)
See above re: renal crisis.   UA bland - Start captopril and follow Cr closely - Consult Nephrology

## 2022-08-09 NOTE — ED Notes (Signed)
Pt difficult IV stick. Multiple unsuccessful attempts

## 2022-08-09 NOTE — ED Provider Notes (Signed)
EMERGENCY DEPARTMENT AT [facility] Provider Note   CSN: [id number] Arrival date & time: 08/09/22  1008     History  Chief Complaint  Patient presents with   Hypertension    [name] is a 72 y.o. female.   Hypertension  Patient presents with dizziness headache and high blood pressure.  States she felt bad.  Has had some blood pressure issues today.  Checked her blood pressure and it was 225/117.  No history of hypertension.  States on Tuesday it was checked with today being Friday and it was 140/80.  Has a slight headache.  Feels a little dizzy.  No other numbness or weakness.  No confusion.  No chest pain and does have occasional cough.    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 Ruthann Cancer at [facility]   systemic sclerosis , diffuse/   rheumotologist:  dr Manuella Ghazi at [facility]---  lov 07-22-2017 (as of 07-25-2017 note not available to read)---  per pt has appt. w/ specialist at [place] thickening    hands,  arms, legs  caused by scleroderma   Systemic sclerosis ([place])    UTI (urinary tract infection)     Home Medications Prior to Admission medications   Medication Sig Start Date End Date Taking? Authorizing Provider  acetaminophen (TYLENOL) 650 MG CR tablet Take 650 mg by mouth as needed. 02/13/19   [provider]  ELDERBERRY PO Take by mouth 2 (two) times daily.    [provider]  furosemide (LASIX) 20 MG tablet Take 20 mg by mouth daily. 12/01/16   [provider]  OVER THE COUNTER MEDICATION Relief Factor    [provider]  SYNTHROID 100 MCG tablet Take 1 tablet (100 mcg total) by mouth daily before breakfast. 07/18/22   Copland, Gay Filler, MD      Allergies    Betadine [povidone iodine], Contrast media [iodinated contrast media], Epinephrine, Iodine, Oxycodone, Gabapentin, Lidocaine, Nickel, Other, Penicillins, and Sulfa antibiotics    Review of Systems   Review of Systems  Physical Exam Updated Vital Signs BP (!) 224/105   Pulse 91   Temp 98.3 F (36.8 C) (Oral)   Resp 19   Ht '5\' 4"'$  (1.626 m)   Wt 68 kg   SpO2 97%   BMI 25.75 kg/m  Physical Exam Vitals reviewed.  HENT:     Head:  Atraumatic.  Eyes:     Pupils: Pupils are equal, round, and reactive to light.  Cardiovascular:     Rate and Rhythm: Regular rhythm. Tachycardia present.  Abdominal:     Tenderness: There is no abdominal tenderness.  Musculoskeletal:        General: No tenderness.  Skin:    Capillary Refill: Capillary refill takes less than 2 seconds.  Neurological:     Mental Status: She is alert and oriented to person, place, and time.     ED Results / Procedures / Treatments   Labs (all labs ordered are listed, but only abnormal results are displayed) Labs Reviewed  CBC WITH DIFFERENTIAL/PLATELET  COMPREHENSIVE METABOLIC PANEL  URINALYSIS, ROUTINE W REFLEX MICROSCOPIC    EKG None  Radiology No results found.  Procedures Procedures    Medications  Ordered in ED Medications  labetalol (NORMODYNE) injection 10 mg (has no administration in time range)    ED Course/ Medical Decision Making/ A&P                             Medical Decision Making Amount and/or Complexity of Data Reviewed Labs: ordered. Radiology: ordered.  Risk Prescription drug management.   Rather severe hypertension.Patient with headache.  Hypertension.  Dizziness.  No history of same.  Differential diagnosis includes symptomatic hypertension, stroke, hypertension.  Will get basic blood work head CT and will give some IV labetalol to help bring it down.  Chest x-ray showed potential pneumonia and has had some coughing.  Some shortness of breath.  Will treat with antibiotics will use Levaquin since sounds rather severe penicillin allergy and I cannot see that she has had cephalosporins in the past.  Head CT is reassuring but creatinine is mildly elevated.  Higher than baseline.  With hypertension and worsening creatinine have to assume this is an endorgan damage from the blood pressure.  Would require more urgent treatment.  Blood pressures come down some to 190/90 after 10 mg labetalol.  Will discuss with hospitalist for admission.        Final Clinical Impression(s) / ED Diagnoses Final diagnoses:  None    Rx / DC Orders ED Discharge Orders     None         Davonna Belling, MD 08/09/22 1459

## 2022-08-09 NOTE — H&P (Signed)
History and Physical    Patient: [name] YYT:[id number] DOB: 1950-07-26 DOA: 08/09/2022 DOS: the patient was seen and examined on 08/09/2022 PCP: Copland, Gay Filler, MD  Patient coming from:  [facility] ED  Chief Complaint:  Chief Complaint  Patient presents with   Hypertension   HPI: [name] is a 72 y.o. female with medical history significant of breast cancer s/p lumpectomy (2013), radiation/tamoxifen, hypothyroidism, HTN, diffuse cutaneous systemic sclerosis, scleroderma, raynaud who presents with hypertension.    Pt checked BP at home today and it was 225/117 with associated headache, blurred vision. Normally BP runs 130/70 but it trended up to 140 a few days ago when she was at GI office to evaluate new dysphagia. Has been having a cough for the past week with sinus congestion and taking some OTC medications. Also has been having increasing shortness of breath worse with exertion over the past month. Worse LE edema the past 3 days. She is already on daily 20-'40mg'$  Lasix depending on her edema. She follows at [facility] for her systemic sclerosis and has planned appointment with local Pulmonologist Dr. Duane Boston check PFTs.   In ED, BP up to 218/103, creatinine elevated to 1.8 from 1.11 concerning for end-organ damage and hospitalist was requested for admission and transfer to [facility].   She was given severe dose of IV Labetalol, IV hydralazine and started on p.o. amlodipine with improvement now to 188/103.   No leukocytosis or anemia. Mild hyponatremia of 131, K of 3. Calcium of 8.7 with normal albumin.     Review of Systems: As mentioned in the history of present illness. All other systems reviewed and are negative. 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, ER/PR+, HER2 negative --  11-04-2011  left partial mastectomy w/ sln dissection-- completed radiation 01-08-2012-- started tamoxifen 07/ 2013-- per lov note 11/ 2018 no recurrence   Colon polyp    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   Depression    Esophageal stricture    GERD (gastroesophageal reflux disease)    Hiatal hernia    History of external beam radiation therapy 11-25-2011 to 01-08-2012   left breast 45 Gy at 1.8 per fraction x25 fractions, boost 16 Gy at 2 per fraction x8 fractions   Hyperlipidemia    Hypothyroidism    Internal hemorrhoids    Osteoarthritis    Osteopenia    Positive ANA (antinuclear antibody)    Rash    arms and legs caused by skin scleroderma   Raynaud's syndrome    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 Ruthann Cancer at [facility]   systemic sclerosis , diffuse/   rheumotologist:  dr Manuella Ghazi at [facility]---  lov 07-22-2017 (as of 07-25-2017 note not available to read)---  per pt has appt. w/ specialist at [facility]   Skin thickening    hands, arms, legs  caused by scleroderma   Systemic sclerosis ([place])    UTI (urinary tract infection)    Past Surgical History:  Procedure Laterality Date   BIOPSY  01/29/2018   Procedure: BIOPSY;  Surgeon: Jerene Bears, MD;  Location: [facility];  Service: Gastroenterology;;   Lillard Anes  2000   Left   COLONOSCOPY WITH PROPOFOL N/A 01/29/2018   Procedure: COLONOSCOPY WITH PROPOFOL;  Surgeon: Jerene Bears, MD;  Location: [facility];  Service: Gastroenterology;  Laterality: N/A;   DILATATION & CURETTAGE/HYSTEROSCOPY WITH MYOSURE N/A 08/04/2017   Procedure: DILATATION & CURETTAGE/HYSTEROSCOPY WITH MYOSURE;  Surgeon: Megan Salon, MD;  Location[facility];  Service: Gynecology;  Laterality: N/A;   ECTOPIC PREGNANCY SURGERY  1986-88   s/p unilateral salpingectomy   ESOPHAGOGASTRODUODENOSCOPY (EGD) WITH PROPOFOL N/A 01/29/2018   Procedure: ESOPHAGOGASTRODUODENOSCOPY (EGD) WITH PROPOFOL;  Surgeon: Jerene Bears,  MD;  Location: [facility];  Service: Gastroenterology;  Laterality: N/A;   PARTIAL MASTECTOMY WITH AXILLARY SENTINEL LYMPH NODE BIOPSY Left 11-04-2011   dr Margot Chimes  [facility]   TRANSTHORACIC ECHOCARDIOGRAM  06-04-2017    [facility]   moderate LVH, ef>55%/  trivial AR and TR/ mild MR and PR/  trivial pericardial effusion   WISDOM TOOTH EXTRACTION     Social History:  reports that she has never smoked. She has never used smokeless tobacco. She reports current alcohol use. She reports that she does not use drugs.  Allergies  Allergen Reactions   Betadine [Povidone Iodine] Rash   Contrast Media [Iodinated Contrast Media] Rash and Other (See Comments)    "per pt: recently had CT 08/ 2018 even through given pre-medication, IVP still caused rash"   Epinephrine Other (See Comments)    Severe headaches    Iodine Rash and Other (See Comments)   Oxycodone Anxiety   Gabapentin Other (See Comments)    Dizziness   Lidocaine Rash   Nickel Rash and Other (See Comments)    Rash and blisters   Other Rash and Other (See Comments)    Hospital gown   Penicillins Rash and Other (See Comments)    Rash and blisters Has patient had a PCN reaction causing immediate rash, facial/tongue/throat swelling, SOB or lightheadedness with hypotension: Yes Has patient had a PCN reaction causing severe rash involving mucus membranes or skin necrosis: No Has patient had a PCN reaction that required hospitalization: No Has patient had a PCN reaction occurring within the last 10 years: No If all of the above answers are "NO", then may proceed with Cephalosporin use.    Sulfa Antibiotics Rash    Family History  Problem Relation Age of Onset   Heart disease Mother    Heart failure Mother    Heart disease Father    Ovarian cancer Maternal Aunt    Heart disease Paternal Uncle        multiple   Autoimmune disease Sister    Arrhythmia Sister    Anemia Sister    CAD Brother    Colon cancer Neg Hx    Stomach cancer Neg Hx     Esophageal cancer Neg Hx    Rectal cancer Neg Hx     Prior to Admission medications   Medication Sig Start Date End Date Taking? Authorizing Provider  acetaminophen (TYLENOL) 650 MG CR tablet Take 650-1,300 mg by mouth 2 (two) times daily as needed for pain or fever. 02/13/19  Yes [provider]  ADVIL MULTI-SYMPTOM COLD & FLU 4-10-200 MG TABS Take 1 tablet by mouth every 8 (eight) hours as needed (for flu-like symptoms).   Yes [provider]  ELDERBERRY PO Take 15 mLs by mouth daily.   Yes [provider]  furosemide (LASIX) 20 MG tablet Take 20-40 mg by mouth daily. 12/01/16  Yes [provider]  OVER THE COUNTER MEDICATION Take 4-8 capsules by mouth See admin instructions. Relief Factor capsules- Take 4-8 capsules  by mouth daily   Yes [provider]  sodium chloride (OCEAN) 0.65 % SOLN nasal spray Place 1 spray into both nostrils as needed for congestion.   Yes [provider]  SYNTHROID 100 MCG tablet Take 1 tablet (100 mcg total) by mouth daily before breakfast. 07/18/22  Yes Copland, Gay Filler, MD  triamcinolone cream (KENALOG) 0.1 % Apply 1 Application topically 2 (two) times daily as needed (to afected areas- for itching).   Yes [provider]  Demetra Shiner SEVERE 0.05 % nasal spray Place 1 spray into both nostrils 2 (two) times daily as needed for congestion.   Yes [provider]    Physical Exam: Vitals:   08/09/22 1500 08/09/22 1530 08/09/22 1659 08/09/22 2048  BP: (!) 198/95 (!) 185/96 (!) 188/103 (!) 195/100  Pulse:   89 95  Resp: '19 18 20 20  '$ Temp:   98.3 F (36.8 C) 98.2 F (36.8 C)  TempSrc:   Oral Oral  SpO2: 98% 99% 99% 99%  Weight:      Height:       Constitutional: NAD, calm, comfortable, well appearing elderly female younger than stated age sitting upright in bed Eyes: lids and conjunctivae normal ENMT: Mucous membranes are moist.  Neck: normal, supple,  Respiratory: left basilar crackles.  Increase work of breathing in between sentences. No accessory muscle use.  Cardiovascular: Regular rate and rhythm, no murmurs / rubs / gallops. No extremity edema. Abdomen: no tenderness,  Bowel sounds positive.  Musculoskeletal: no clubbing / cyanosis. No joint deformity upper and lower extremities. Good ROM, no contractures. Normal muscle tone.  Skin: no rashes, lesions, ulcers.  Neurologic: CN 2-12 grossly intact. Strength 5/5 in all 4.  Psychiatric: Normal judgment and insight. Alert and oriented x 3. Normal mood. Data Reviewed:  See HPI  Assessment and Plan: * Hypertensive urgency -unclear etiology although likely related to her systemic sclerosis and cardiomegaly seen on CXR -continue IV '10mg'$  hydralazine PRN to goal to normalize to <140/80 over the next 24 hours -continue amlodipine '5mg'$   CAP (community acquired pneumonia) -possible left basilar pneumonia. Not hypoxic. -continue daily Levaquin  AKI (acute kidney injury) ([place]) -both pre-renal and possibly due to hypertensive urgency -follow with repeat tomorrow with fluids -avoid nephrotoxic agent -hold Lasix for now  Hypothyroidism -has to take brand name synthroid which is non-formulary. Had her dose today and husband to bring tomorrow.  Hypocalcemia -Mild. Check IV Mg and will replete as needed.  Hypokalemia -potassium of 3. Replete with IV potassium.  Dyspnea -dyspnea worse with exertion for the past month. CXR now showing cardiomegaly and possible loculated pleural effusion -last echo in 04/2022 with EF of 55% -will repeat echocardiogram to access any new heart failure or increase in pulm pressure  Dysphagia -has planned endoscopy next Tuesday with [facility]. Possible GI manifestation of her systemic sclerosis.  Diffuse systemic sclerosis (H[facility]) -[facility] so she is seen twice yearly. Previous on Mycophenolate but d/c due to persistent Shingles. She has appointment with [facility] in February since she is having worsening dyspnea.  Malignant neoplasm of upper-outer quadrant of left breast in female, estrogen receptor positive ([place]) -in remission. Had recent negative mammogram.  -follows with oncologist Dr. [name].      Advance Care Planning:   Code Status: Full Code   Consults: none  Family Communication: none at bedside  Severity of Illness: The appropriate patient status for this patient is OBSERVATION. Observation status is  judged to be reasonable and necessary in order to provide the required intensity of service to ensure the patient's safety. The patient's presenting symptoms, physical exam findings, and initial radiographic and laboratory data in the context of their medical condition is felt to place them at decreased risk for further clinical deterioration. Furthermore, it is anticipated that the patient will be medically stable for discharge from the hospital within 2 midnights of admission.   Author: Orene Desanctis, DO 08/09/2022 9:39 PM  For on call review [email or web address].

## 2022-08-09 NOTE — Assessment & Plan Note (Addendum)
Follows with Dr. Manuella Ghazi at [facility] and Dr. Manuella Ghazi at [facility].  Previously on MMF.   Not currently on treatment.  Have discussed with Rheum fellow at [facility] and [place] transfer center has shared case with [facility] Rheum fellow.  Above treatment plan discussed with them.

## 2022-08-09 NOTE — Assessment & Plan Note (Signed)
-  potassium of 3. Replete with IV potassium.

## 2022-08-09 NOTE — Assessment & Plan Note (Signed)
-  in remission. Had recent negative mammogram.  -follows with oncologist Dr. [name].

## 2022-08-09 NOTE — Telephone Encounter (Signed)
Patient called to advise that her Blood pressure spiked to 225/117. Patient concerned and not sure what to do. Transferred to triage

## 2022-08-09 NOTE — Progress Notes (Signed)
PHARMACY NOTE:  ANTIMICROBIAL RENAL DOSAGE ADJUSTMENT  Current antimicrobial regimen includes a mismatch between antimicrobial dosage and estimated renal function.  As per policy approved by the Pharmacy & Therapeutics and Medical Executive Committees, the antimicrobial dosage will be adjusted accordingly.  Current antimicrobial dosage:  levaquin '750mg'$  IV q24h   Renal Function:  Estimated Creatinine Clearance: 26.4 mL/min (A) (by C-G formula based on SCr of 1.8 mg/dL (H)). '[]'$      On intermittent HD, scheduled: '[]'$      On CRRT    Antimicrobial dosage has been changed to:  levaquin '750mg'$  IV q48h  Additional comments:   Thank you for allowing pharmacy to be a part of this patient's care.  Angela Adam, [facility] 08/09/2022 9:43 PM

## 2022-08-09 NOTE — Progress Notes (Signed)
Chaplain received a consult for prayer and reached out to the patient by phone.  [name] shared that she was doing okay for now and did not need anything at this time.  Chaplain Janne Napoleon, [place] PAger, [phone]

## 2022-08-09 NOTE — Assessment & Plan Note (Addendum)
New cough noted and CXR on admission showed new left base opacity/effusion but no fever or sputum.  Given dysphagia, aspiration is a consideration.  Started on Levaquin empirically on admission.   Last chest imaging in our system from last Aug PET/CT: " Mild fibrotic changes at both lung bases without focal airspace disease. Mild dilatation of the distal esophagus which may relate to the patient's scleroderma."  BNP and troponin mildly elevated - Continue Levaquin for now - Check procal, low threshold to de-escalate abx - Obtain CT chest  - Obtain echo - IV Lasix once now

## 2022-08-09 NOTE — Assessment & Plan Note (Addendum)
-  Start captopril - Wean amlodipine - Check retics, smear, LDH, haptoglobin - Consult Nephrology - Plan for transfer to tertiary care center with Rheumatology consult service is underway

## 2022-08-09 NOTE — Assessment & Plan Note (Addendum)
-  dyspnea worse with exertion for the past month. CXR now showing cardiomegaly and possible loculated pleural effusion -last echo in 04/2022 with EF of 55% -will repeat echocardiogram to access any new heart failure or increase in pulm pressure

## 2022-08-09 NOTE — Progress Notes (Signed)
Plan of Care Note for accepted transfer   Patient: [name] MRN: [id number]   [place]: 08/09/2022  Facility requesting transfer: [facility].  Requesting Provider: Davonna Belling, MD Reason for transfer: Hypertensive urgency. Facility course:  Per Dr. Alvino Chapel: "[name] is a 72 y.o. female. Patient presents with dizziness headache and high blood pressure.  States she felt bad.  Has had some blood pressure issues today.  Checked her blood pressure and it was 225/117.  No history of hypertension.  States on Tuesday it was checked with today being Friday and it was 140/80.  Has a slight headache.  Feels a little dizzy.  No other numbness or weakness.  No confusion.  No chest pain and does have occasional cough."  Lab work: CBC with Differential [[id number]] (Abnormal)   Collected: 08/09/22 1150   Updated: 08/09/22 1241   Specimen Type: Blood   Specimen Source: Vein    WBC 9.6 K/uL   RBC 4.37 MIL/uL   Hemoglobin 12.7 g/dL   HCT 37.4 %   MCV 85.6 fL   MCH 29.1 pg   MCHC 34.0 g/dL   RDW 15.9 High  %   Platelets 125 Low  K/uL   nRBC 0.0 %   Neutrophils Relative % 79 %   Neutro Abs 7.7 K/uL   Lymphocytes Relative 10 %   Lymphs Abs 0.9 K/uL   Monocytes Relative 7 %   Monocytes Absolute 0.7 K/uL   Eosinophils Relative 3 %   Eosinophils Absolute 0.3 K/uL   Basophils Relative 1 %   Basophils Absolute 0.1 K/uL   Immature Granulocytes 0 %   Abs Immature Granulocytes 0.03 K/uL  Comprehensive metabolic panel [[id number]] (Abnormal)   Collected: 08/09/22 1150   Updated: 08/09/22 1220   Specimen Type: Blood   Specimen Source: Vein    Sodium 131 Low  mmol/L   Potassium 3.0 Low  mmol/L   Chloride 98 mmol/L   CO2 24 mmol/L   Glucose, Bld 113 High  mg/dL   BUN 17 mg/dL   Creatinine, Ser 1.80 High  mg/dL   Calcium 8.7 Low  mg/dL   Total Protein 6.9 g/dL   Albumin 3.7 g/dL   AST 47 High  U/L   ALT 23 U/L   Alkaline Phosphatase 64 U/L   Total Bilirubin 1.4  High  mg/dL   GFR, Estimated 29 Low  mL/min   Anion gap 9   Imaging: PORTABLE CHEST 1 VIEW   COMPARISON:  August 12, 2017.   FINDINGS: Mild cardiomegaly is noted. Minimal right basilar subsegmental atelectasis is noted. Left basilar atelectasis or pneumonia is noted with probable associated loculated pleural effusion. Bony thorax is unremarkable.   IMPRESSION: Mild left basilar atelectasis or pneumonia with probable associated loculated left pleural effusion.   Electronically Signed   By: Marijo Conception M.D.   On: 08/09/2022 12:24  Plan of care: The patient is accepted for admission to Progressive unit, at [facility].  Creatinine has increased to 1.80 from 1.11 mg/dL on 05/22/2022.  Urinalysis still pending.  The patient has been given labetalol 10 mg IVP and levofloxacin 750 mg IVPB.  She is also going to receive amlodipine 5 mg p.o. now.  We will continue the as needed labetalol.  Author: Reubin Milan, MD 08/09/2022  Check [email or web address] for on-call coverage.  Nursing staff, Please call [place] number on Amion as soon as patient's arrival, so appropriate admitting provider can evaluate  the pt.

## 2022-08-09 NOTE — Assessment & Plan Note (Signed)
Supplement and monitor 

## 2022-08-09 NOTE — ED Notes (Signed)
Called [facility] for Transport at 2:56

## 2022-08-09 NOTE — Telephone Encounter (Signed)
Nurse Assessment Nurse: Alvis Lemmings, RN, Marcie Bal Date/Time Eilene Ghazi Time): 08/09/2022 9:19:12 AM Confirm and document reason for call. If symptomatic, describe symptoms. ---Caller states her BP is 225/117 she is experiencing dizziness, coughing(2 weeks), and swollen legs. SOB, No CP Does the patient have any new or worsening symptoms? ---Yes Will a triage be completed? ---Yes Related visit to physician within the last 2 weeks? ---No Does the PT have any chronic conditions? (i.e. diabetes, asthma, this includes High risk factors for pregnancy, etc.) ---Yes List chronic conditions. ---Scleroderma, thyroid, diuretic, lasix '40mg'$  Is this a behavioral health or substance abuse call? ---No Guidelines Guideline Title Affirmed Question Affirmed Notes Nurse Date/Time (Eastern Time) Blood Pressure - High [3] Systolic BP >= 546 OR Diastolic >= 568 AND [1] cardiac (e.g., breathing difficulty, chest pain) or neurologic symptoms (e.g., new-onset blurred or double vision, unsteady gait) Alvis Lemmings, RN, Marcie Bal 08/09/2022 9:22:35 AM PLEASE NOTE: All timestamps contained within this report are represented as [place] Standard Time. CONFIDENTIALTY NOTICE: This fax transmission is intended only for the addressee. It contains information that is legally privileged, confidential or otherwise protected from use or disclosure. If you are not the intended recipient, you are strictly prohibited from reviewing, disclosing, copying using or disseminating any of this information or taking any action in reliance on or regarding this information. If you have received this fax in error, please notify us immediately by telephone so that we can arrange for its return to [place]. Phone: [phone], Toll-Free: [phone], Fax: [phone] Page: 2 of 2 Call Id: [id number] [place]. Time Eilene Ghazi Time) Disposition Final User 08/09/2022 9:16:28 AM Send to Urgent Queue Ernestene Mention 08/09/2022 9:26:57 AM Go to ED Now Yes  Alvis Lemmings, RN, Marcie Bal Final Disposition 08/09/2022 9:26:57 AM Go to ED Now Yes Alvis Lemmings, RN, Lenon Oms Disagree/Comply Comply Caller Understands Yes PreDisposition Call Doctor Care Advice Given Per Guideline GO TO ED NOW: * You need to be seen in the Emergency Department. * Go to the ED at ___________ [place] now. Drive carefully. NOTE TO TRIAGER - DRIVING: * Another adult should drive. CARE ADVICE given per High Blood Pressure (Adult) guideline. * Patient should not delay going to the emergency department. * Passes out or faints * Becomes confused * Becomes too weak to stand * You become worse Referrals [place]

## 2022-08-10 ENCOUNTER — Inpatient Hospital Stay (HOSPITAL_COMMUNITY): Payer: Medicare Other

## 2022-08-10 ENCOUNTER — Observation Stay: Payer: Self-pay

## 2022-08-10 ENCOUNTER — Observation Stay (HOSPITAL_BASED_OUTPATIENT_CLINIC_OR_DEPARTMENT_OTHER): Payer: Medicare Other

## 2022-08-10 DIAGNOSIS — E039 Hypothyroidism, unspecified: Secondary | ICD-10-CM | POA: Diagnosis present

## 2022-08-10 DIAGNOSIS — R519 Headache, unspecified: Secondary | ICD-10-CM | POA: Diagnosis present

## 2022-08-10 DIAGNOSIS — N179 Acute kidney failure, unspecified: Secondary | ICD-10-CM | POA: Diagnosis present

## 2022-08-10 DIAGNOSIS — N1831 Chronic kidney disease, stage 3a: Secondary | ICD-10-CM | POA: Diagnosis present

## 2022-08-10 DIAGNOSIS — Z885 Allergy status to narcotic agent status: Secondary | ICD-10-CM | POA: Diagnosis not present

## 2022-08-10 DIAGNOSIS — Z91041 Radiographic dye allergy status: Secondary | ICD-10-CM | POA: Diagnosis not present

## 2022-08-10 DIAGNOSIS — R42 Dizziness and giddiness: Secondary | ICD-10-CM | POA: Diagnosis present

## 2022-08-10 DIAGNOSIS — M3489 Other systemic sclerosis: Secondary | ICD-10-CM | POA: Diagnosis present

## 2022-08-10 DIAGNOSIS — Z923 Personal history of irradiation: Secondary | ICD-10-CM | POA: Diagnosis not present

## 2022-08-10 DIAGNOSIS — Z884 Allergy status to anesthetic agent status: Secondary | ICD-10-CM | POA: Diagnosis not present

## 2022-08-10 DIAGNOSIS — R0602 Shortness of breath: Secondary | ICD-10-CM | POA: Diagnosis not present

## 2022-08-10 DIAGNOSIS — D696 Thrombocytopenia, unspecified: Secondary | ICD-10-CM | POA: Diagnosis not present

## 2022-08-10 DIAGNOSIS — J849 Interstitial pulmonary disease, unspecified: Secondary | ICD-10-CM | POA: Diagnosis not present

## 2022-08-10 DIAGNOSIS — D649 Anemia, unspecified: Secondary | ICD-10-CM | POA: Diagnosis not present

## 2022-08-10 DIAGNOSIS — Z79899 Other long term (current) drug therapy: Secondary | ICD-10-CM | POA: Diagnosis not present

## 2022-08-10 DIAGNOSIS — E038 Other specified hypothyroidism: Secondary | ICD-10-CM | POA: Diagnosis not present

## 2022-08-10 DIAGNOSIS — Z859 Personal history of malignant neoplasm, unspecified: Secondary | ICD-10-CM | POA: Diagnosis not present

## 2022-08-10 DIAGNOSIS — Z883 Allergy status to other anti-infective agents status: Secondary | ICD-10-CM | POA: Diagnosis not present

## 2022-08-10 DIAGNOSIS — N280 Ischemia and infarction of kidney: Secondary | ICD-10-CM

## 2022-08-10 DIAGNOSIS — Z79890 Hormone replacement therapy (postmenopausal): Secondary | ICD-10-CM | POA: Diagnosis not present

## 2022-08-10 DIAGNOSIS — M8580 Other specified disorders of bone density and structure, unspecified site: Secondary | ICD-10-CM | POA: Diagnosis present

## 2022-08-10 DIAGNOSIS — I3139 Other pericardial effusion (noninflammatory): Secondary | ICD-10-CM | POA: Diagnosis present

## 2022-08-10 DIAGNOSIS — J8410 Pulmonary fibrosis, unspecified: Secondary | ICD-10-CM | POA: Diagnosis not present

## 2022-08-10 DIAGNOSIS — R1310 Dysphagia, unspecified: Secondary | ICD-10-CM | POA: Diagnosis present

## 2022-08-10 DIAGNOSIS — Z880 Allergy status to penicillin: Secondary | ICD-10-CM | POA: Diagnosis not present

## 2022-08-10 DIAGNOSIS — J189 Pneumonia, unspecified organism: Secondary | ICD-10-CM | POA: Diagnosis present

## 2022-08-10 DIAGNOSIS — R0609 Other forms of dyspnea: Secondary | ICD-10-CM | POA: Diagnosis not present

## 2022-08-10 DIAGNOSIS — E876 Hypokalemia: Secondary | ICD-10-CM | POA: Diagnosis present

## 2022-08-10 DIAGNOSIS — M349 Systemic sclerosis, unspecified: Secondary | ICD-10-CM | POA: Diagnosis present

## 2022-08-10 DIAGNOSIS — I7300 Raynaud's syndrome without gangrene: Secondary | ICD-10-CM | POA: Diagnosis present

## 2022-08-10 DIAGNOSIS — N08 Glomerular disorders in diseases classified elsewhere: Secondary | ICD-10-CM | POA: Diagnosis not present

## 2022-08-10 DIAGNOSIS — I160 Hypertensive urgency: Secondary | ICD-10-CM | POA: Diagnosis present

## 2022-08-10 DIAGNOSIS — I159 Secondary hypertension, unspecified: Secondary | ICD-10-CM | POA: Diagnosis not present

## 2022-08-10 DIAGNOSIS — I5031 Acute diastolic (congestive) heart failure: Secondary | ICD-10-CM | POA: Diagnosis present

## 2022-08-10 DIAGNOSIS — Z9221 Personal history of antineoplastic chemotherapy: Secondary | ICD-10-CM | POA: Diagnosis not present

## 2022-08-10 DIAGNOSIS — Z882 Allergy status to sulfonamides status: Secondary | ICD-10-CM | POA: Diagnosis not present

## 2022-08-10 DIAGNOSIS — D594 Other nonautoimmune hemolytic anemias: Secondary | ICD-10-CM | POA: Diagnosis not present

## 2022-08-10 DIAGNOSIS — N189 Chronic kidney disease, unspecified: Secondary | ICD-10-CM | POA: Diagnosis not present

## 2022-08-10 DIAGNOSIS — M3481 Systemic sclerosis with lung involvement: Secondary | ICD-10-CM | POA: Diagnosis not present

## 2022-08-10 DIAGNOSIS — Z853 Personal history of malignant neoplasm of breast: Secondary | ICD-10-CM | POA: Diagnosis not present

## 2022-08-10 DIAGNOSIS — Z8719 Personal history of other diseases of the digestive system: Secondary | ICD-10-CM | POA: Diagnosis not present

## 2022-08-10 DIAGNOSIS — I129 Hypertensive chronic kidney disease with stage 1 through stage 4 chronic kidney disease, or unspecified chronic kidney disease: Secondary | ICD-10-CM | POA: Diagnosis not present

## 2022-08-10 DIAGNOSIS — I10 Essential (primary) hypertension: Secondary | ICD-10-CM | POA: Diagnosis not present

## 2022-08-10 DIAGNOSIS — Z1152 Encounter for screening for COVID-19: Secondary | ICD-10-CM | POA: Diagnosis not present

## 2022-08-10 DIAGNOSIS — I130 Hypertensive heart and chronic kidney disease with heart failure and stage 1 through stage 4 chronic kidney disease, or unspecified chronic kidney disease: Secondary | ICD-10-CM | POA: Diagnosis present

## 2022-08-10 DIAGNOSIS — J690 Pneumonitis due to inhalation of food and vomit: Secondary | ICD-10-CM | POA: Diagnosis not present

## 2022-08-10 DIAGNOSIS — R1312 Dysphagia, oropharyngeal phase: Secondary | ICD-10-CM | POA: Diagnosis not present

## 2022-08-10 DIAGNOSIS — J90 Pleural effusion, not elsewhere classified: Secondary | ICD-10-CM | POA: Diagnosis not present

## 2022-08-10 DIAGNOSIS — E871 Hypo-osmolality and hyponatremia: Secondary | ICD-10-CM | POA: Diagnosis present

## 2022-08-10 LAB — SODIUM, URINE, RANDOM: Sodium, Ur: 91 mmol/L

## 2022-08-10 LAB — URINALYSIS, ROUTINE W REFLEX MICROSCOPIC
Bacteria, UA: NONE SEEN
Bilirubin Urine: NEGATIVE
Glucose, UA: NEGATIVE mg/dL
Ketones, ur: NEGATIVE mg/dL
Nitrite: NEGATIVE
Protein, ur: NEGATIVE mg/dL
Specific Gravity, Urine: 1.004 — ABNORMAL LOW (ref 1.005–1.030)
pH: 6 (ref 5.0–8.0)

## 2022-08-10 LAB — BASIC METABOLIC PANEL
Anion gap: 13 (ref 5–15)
BUN: 16 mg/dL (ref 8–23)
CO2: 20 mmol/L — ABNORMAL LOW (ref 22–32)
Calcium: 9 mg/dL (ref 8.9–10.3)
Chloride: 101 mmol/L (ref 98–111)
Creatinine, Ser: 1.74 mg/dL — ABNORMAL HIGH (ref 0.44–1.00)
GFR, Estimated: 31 mL/min — ABNORMAL LOW (ref >60)
Glucose, Bld: 133 mg/dL — ABNORMAL HIGH (ref 70–99)
Potassium: 3.4 mmol/L — ABNORMAL LOW (ref 3.5–5.1)
Sodium: 134 mmol/L — ABNORMAL LOW (ref 135–145)

## 2022-08-10 LAB — CBC
HCT: 38.5 % (ref 36.0–46.0)
Hemoglobin: 12.7 g/dL (ref 12.0–15.0)
MCH: 29 pg (ref 26.0–34.0)
MCHC: 33 g/dL (ref 30.0–36.0)
MCV: 87.9 fL (ref 80.0–100.0)
Platelets: 125 10*3/uL — ABNORMAL LOW (ref 150–400)
RBC: 4.38 MIL/uL (ref 3.87–5.11)
RDW: 16.2 % — ABNORMAL HIGH (ref 11.5–15.5)
WBC: 12.2 10*3/uL — ABNORMAL HIGH (ref 4.0–10.5)
nRBC: 0 % (ref 0.0–0.2)

## 2022-08-10 LAB — ECHOCARDIOGRAM COMPLETE
Area-P 1/2: 3.48 cm2
Height: 64 in
S' Lateral: 2.7 cm
Weight: 2400 oz

## 2022-08-10 LAB — TROPONIN I (HIGH SENSITIVITY): Troponin I (High Sensitivity): 61 ng/L — ABNORMAL HIGH (ref <18)

## 2022-08-10 LAB — BRAIN NATRIURETIC PEPTIDE: B Natriuretic Peptide: 847.4 pg/mL — ABNORMAL HIGH (ref 0.0–100.0)

## 2022-08-10 LAB — CREATININE, URINE, RANDOM: Creatinine, Urine: 15 mg/dL

## 2022-08-10 LAB — PROCALCITONIN: Procalcitonin: <0.1 ng/mL

## 2022-08-10 MED ORDER — FUROSEMIDE 10 MG/ML IJ SOLN: 40.0000 mg | Freq: Every day | INTRAMUSCULAR | Status: DC

## 2022-08-10 MED ORDER — CHLORHEXIDINE GLUCONATE CLOTH 2 % EX PADS: 6.0000 | MEDICATED_PAD | Freq: Every day | CUTANEOUS | Status: DC

## 2022-08-10 MED ORDER — CAPTOPRIL 12.5 MG PO TABS: 6.2500 mg | ORAL_TABLET | Freq: Three times a day (TID) | ORAL | Status: DC

## 2022-08-10 MED ORDER — FENTANYL CITRATE PF 50 MCG/ML IJ SOSY
25.0000 ug | PREFILLED_SYRINGE | Freq: Once | INTRAMUSCULAR | Status: AC | PRN
  Administered 2022-08-10: 25 ug via INTRAVENOUS
  Filled 2022-08-10: qty 1

## 2022-08-10 MED ORDER — SODIUM CHLORIDE 0.9% FLUSH: 10.0000 mL | INTRAVENOUS | Status: DC | PRN

## 2022-08-10 MED ORDER — ONDANSETRON HCL 4 MG/2ML IJ SOLN: 4.0000 mg | Freq: Four times a day (QID) | INTRAMUSCULAR | Status: DC | PRN

## 2022-08-10 MED ORDER — FUROSEMIDE 10 MG/ML IJ SOLN
40.0000 mg | Freq: Two times a day (BID) | INTRAMUSCULAR | Status: DC
  Administered 2022-08-10: 40 mg via INTRAVENOUS
  Filled 2022-08-10: qty 4

## 2022-08-10 MED ORDER — PERFLUTREN LIPID MICROSPHERE
1.0000 mL | INTRAVENOUS | Status: AC | PRN
  Administered 2022-08-10: 4 mL via INTRAVENOUS

## 2022-08-10 MED ORDER — ONDANSETRON HCL 4 MG/2ML IJ SOLN: 4.0000 mg | Freq: Four times a day (QID) | INTRAMUSCULAR | 0 refills | Status: DC | PRN

## 2022-08-10 MED ORDER — ENALAPRILAT 1.25 MG/ML IV SOLN: 0.6250 mg | Freq: Four times a day (QID) | INTRAVENOUS | Status: DC | PRN

## 2022-08-10 MED ORDER — AMLODIPINE BESYLATE 2.5 MG PO TABS: 2.5000 mg | ORAL_TABLET | Freq: Two times a day (BID) | ORAL | Status: DC

## 2022-08-10 MED ORDER — FUROSEMIDE 10 MG/ML IJ SOLN: 40.0000 mg | Freq: Two times a day (BID) | INTRAMUSCULAR | 0 refills | Status: DC

## 2022-08-10 MED ORDER — CAPTOPRIL 12.5 MG PO TABS
12.5000 mg | ORAL_TABLET | Freq: Three times a day (TID) | ORAL | Status: DC
  Filled 2022-08-10 (×2): qty 1

## 2022-08-10 MED ORDER — CAPTOPRIL 25 MG PO TABS
12.5000 mg | ORAL_TABLET | Freq: Three times a day (TID) | ORAL | Status: DC
  Administered 2022-08-10: 12.5 mg via ORAL
  Filled 2022-08-10 (×3): qty 0.5

## 2022-08-10 MED ORDER — ACETAMINOPHEN 325 MG PO TABS: 650.0000 mg | ORAL_TABLET | Freq: Four times a day (QID) | ORAL | Status: DC | PRN

## 2022-08-10 MED ORDER — FUROSEMIDE 10 MG/ML IJ SOLN: 40.0000 mg | Freq: Two times a day (BID) | INTRAMUSCULAR | Status: DC

## 2022-08-10 MED ORDER — LEVOFLOXACIN IN D5W 750 MG/150ML IV SOLN: 750.0000 mg | INTRAVENOUS | Status: DC

## 2022-08-10 MED ORDER — HYDRALAZINE HCL 20 MG/ML IJ SOLN: 10.0000 mg | INTRAMUSCULAR | Status: DC | PRN

## 2022-08-10 MED ORDER — AMLODIPINE BESYLATE 5 MG PO TABS: 2.5000 mg | ORAL_TABLET | Freq: Every day | ORAL | Status: DC

## 2022-08-10 MED ORDER — AMLODIPINE BESYLATE 5 MG PO TABS
2.5000 mg | ORAL_TABLET | Freq: Two times a day (BID) | ORAL | Status: DC
  Administered 2022-08-10: 2.5 mg via ORAL
  Filled 2022-08-10: qty 1

## 2022-08-10 MED ORDER — FUROSEMIDE 10 MG/ML IJ SOLN: 20.0000 mg | Freq: Once | INTRAMUSCULAR | Status: DC

## 2022-08-10 NOTE — Progress Notes (Signed)
Progress Note   Patient: [name] OVF:[id number] DOB: [birth year] DOA: 08/09/2022     0 DOS: the patient was seen and examined on 08/10/2022        Brief hospital course: [name] is a 72 y.o. F with diffuse cutaneous systemic sclerosis, hypothyroidism, HTN and BrCA remote who presented with 2 weeks malaise, dysphagia, cough, DOE and now severe hypertension and headache.  Has been feeling generally bad last few weeks, increased dysphagia, increased "skin tightness" and severe headaches then also dyspnea on exertion, cough and fatigue.    On day of admission, checked BP and was >220 mmHg so came to the ER.    1/26: BP in ER 218/103, Cr 1.8 (from baseline 1.1), given IV labetalol and hydralazine and amditted 1/27: Nephrology for ?renal crisis; GI consulted for dysphagia     Assessment and Plan: * Acute scleroderma renal crisis ([place]) - Start captopril - Wean amlodipine - Check retics, smear, LDH, haptoglobin - Consult Nephrology - Plan for transfer to tertiary care center with Rheumatology consult service is underway  Diffuse systemic sclerosis [facility]) Follows with Dr. Manuella Ghazi at [facility] and Dr. Manuella Ghazi at [facility].  Previously on MMF.   Not currently on treatment.  Have discussed with Rheum fellow at [facility] and [place] transfer center has shared case with [facility] Rheum fellow.  Above treatment plan discussed with them.  AKI (acute kidney injury) ([place]) See above re: renal crisis.   UA bland - Start captopril and follow Cr closely - Consult Nephrology   Possible community acquired pneumonia vs CHF New cough noted and CXR on admission showed new left base opacity/effusion but no fever or sputum.  Given dysphagia, aspiration is a consideration.  Started on Levaquin empirically on admission.   Last chest imaging in our system from last Aug PET/CT: " Mild fibrotic changes at both lung bases without focal airspace disease. Mild dilatation of the distal esophagus which may relate to the  patient's scleroderma."  BNP and troponin mildly elevated - Continue Levaquin for now - Check procal, low threshold to de-escalate abx - Obtain CT chest  - Obtain echo - IV Lasix once now  Hypothyroidism TSH elevated - Continue home Synthroid - Check fT4  Hypocalcemia Supplemented  Hypokalemia - Repleted  Dysphagia This worsened markedly in the last few weeks, and patient already had EGD planned for next Tuesday with [facility]    Malignant neoplasm of upper-outer quadrant of left breast in female, estrogen receptor positive ([place]) -in remission. Had recent negative mammogram.  -follows with oncologist Dr. [name].          Subjective: Patient coughing, swollen, tired, malaise, headache.  No confusion.     Physical Exam: BP (!) 186/92 (BP Location: Right Arm)   Pulse 99   Temp 98.6 F (37 C) (Oral)   Resp 20   Ht '5\' 4"'$  (1.626 m)   Wt 68 kg   SpO2 96%   BMI 25.75 kg/m   Adult female, sitting up in bed, appears out of breath from coughing Tachycardic, regular, diffuse nonpitting edema in all 4 extremities, no JVD Respiratory rate seems increased, lung sounds diminished, chest raspy cough, but I do not appreciate rales or wheezes Abdomen soft without tenderness palpation or guarding Thickening the skin of the hands and sclerodactyly, diffuse skin induration Attention normal, affect pleasant, judgment and insight appear normal, moves upper extremities with normal strength and coordination, speech fluent    Data Reviewed: Discussed with nephrology, [place] rheumatology, Dr. Marcello Moores from  general medicine at [facility] Troponin 61, flat Sodium up to 134 Potassium up to 3.4 White blood cell count up to 12 Creatinine down to 1.74 from 1.8 with fluids overnight TSH 15 COVID-negative    Family Communication: At the bedside    Disposition: Status is: Inpatient Plan for transfer to [place] for Rheumatology evaluation        Author: Edwin Dada, MD 08/10/2022 2:43 PM  For on call review [email or web address].

## 2022-08-10 NOTE — Plan of Care (Signed)
  Problem: Clinical Measurements: Goal: Ability to maintain clinical measurements within normal limits will improve Outcome: Progressing Goal: Will remain free from infection Outcome: Progressing   Problem: Activity: Goal: Risk for activity intolerance will decrease Outcome: Progressing   Problem: Coping: Goal: Level of anxiety will decrease Outcome: Progressing   Problem: Education: Goal: Knowledge of General Education information will improve Description: Including pain rating scale, medication(s)/side effects and non-pharmacologic comfort measures Outcome: Completed/Met

## 2022-08-10 NOTE — Discharge Summary (Signed)
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 F with diffuse cutaneous systemic sclerosis, hypothyroidism, HTN and BrCA remote who presented with 2 weeks malaise, dysphagia, cough, DOE and now severe hypertension and headache.  Had been at baseline until about 2-3 weeks ago, started feeling generally bad, notable increased dysphagia, increased "skin tightness" and severe headaches.  This progresed to dyspnea on exertion, cough and fatigue.    On day of admission, checked BP and was >220 mmHg so came to the ER.    1/26: BP in ER 218/103, Cr 1.8 (from baseline 1.1), given IV labetalol and hydralazine and amditted 1/27: Nephrology for ?renal crisis; GI consulted for dysphagia      * Acute scleroderma renal crisis [facility]) Patient was admitted and started on amlodipine and IV pushes of labetalol and hydralazine PRN.  Subsequently, case discussed with Rheumatology at [facility] and captopril started with goal BP 140/90.    Reticulocytes, peripheral smear, LDH and haptoglobin pending.  Nephrology consulted.      Diffuse systemic sclerosis (HCC) Follows with Dr. Manuella Ghazi at [facility] and Dr. Manuella Ghazi at [facility].  Previously on MMF.   Not currently on treatment.    AKI (acute kidney injury) ([place]) See above re: renal crisis.   UA bland   Possible community acquired pneumonia vs acute diastolic congestive heart failure New  cough noted and CXR on admission showed new left base opacity/effusion but no fever or sputum.  Given dysphagia, aspiration is a consideration.  Started on Levaquin empirically on admission.   Given dyspnea on exertion, pleural effusions, leg swelling and rales on exam, CHF also considered.  Procalcitonin on admission low.    Last chest imaging in our system from last Aug PET/CT: " Mild fibrotic changes at both lung bases without focal airspace disease. Mild dilatation of the distal esophagus which may relate to the patient's scleroderma."  Echo showed moderate pericardial effusion, normal EF, grade I DD.  No significant valvular disease (see below).  CT chest showed fibrotic disease, lower lobes, similar to last August PET CT.       Pericardial effusion CT and echo today showed moderate pericardial effusion without tamponade.  Hypothyroidism TSH elevated, fT4 pending.   Continued home Synthroid  Hypocalcemia Supplemented  Hypokalemia Repleted  Dysphagia This worsened markedly in the last few weeks, and patient already had EGD planned for next Tuesday with [facility]    Malignant neoplasm of upper-outer quadrant of left breast in female, estrogen receptor positive ([place]) In remission    Home medications: Furosemide 20 mg daily Synthroid 100 mcg daily           DISCHARGE MEDICATION: Allergies as of 08/10/2022       Reactions   Betadine [povidone Iodine] Rash   Contrast Media [iodinated Contrast Media] Rash, Other (See Comments)   "per pt: recently had CT 08/ 2018 even through  given pre-medication, IVP still caused rash"   Epinephrine Other (See Comments)   Severe headaches   Iodine Rash, Other (See Comments)   Oxycodone Anxiety   Gabapentin Other (See Comments)   Dizziness   Lidocaine Rash   Nickel Rash, Other (See Comments)   Rash and blisters   Other Rash, Other (See Comments)   Hospital gown   Penicillins Rash, Other (See Comments)   Rash and  blisters Has patient had a PCN reaction causing immediate rash, facial/tongue/throat swelling, SOB or lightheadedness with hypotension: Yes Has patient had a PCN reaction causing severe rash involving mucus membranes or skin necrosis: No Has patient had a PCN reaction that required hospitalization: No Has patient had a PCN reaction occurring within the last 10 years: No If all of the above answers are "NO", then may proceed with Cephalosporin use.   Sulfa Antibiotics Rash        Medication List     STOP taking these medications    acetaminophen 650 MG CR tablet Commonly known as: TYLENOL Replaced by: acetaminophen 325 MG tablet   Advil Multi-Symptom Cold & Flu 4-10-200 MG Tabs Generic drug: Chlorphen-Phenyleph-Ibuprofen   ELDERBERRY PO   furosemide 20 MG tablet Commonly known as: LASIX Replaced by: furosemide 10 MG/ML injection   OVER THE COUNTER MEDICATION   sodium chloride 0.65 % Soln nasal spray Commonly known as: OCEAN   triamcinolone cream 0.1 % Commonly known as: KENALOG   Vicks Sinex Severe 0.05 % nasal spray Generic drug: oxymetazoline       TAKE these medications    acetaminophen 325 MG tablet Commonly known as: TYLENOL Take 2 tablets (650 mg total) by mouth every 6 (six) hours as needed for mild pain or headache. Replaces: acetaminophen 650 MG CR tablet   amLODipine 2.5 MG tablet Commonly known as: NORVASC Take 1 tablet (2.5 mg total) by mouth 2 (two) times daily.   captopril 12.5 MG tablet Commonly known as: CAPOTEN Take 0.5 tablets (6.25 mg total) by mouth every 8 (eight) hours.   furosemide 10 MG/ML injection Commonly known as: LASIX Inject 4 mLs (40 mg total) into the vein every 12 (twelve) hours. Start taking on: August 11, 2022 Replaces: furosemide 20 MG tablet   hydrALAZINE 20 MG/ML injection Commonly known as: APRESOLINE Inject 0.5 mLs (10 mg total) into the vein every 4 (four) hours as needed (systolic greater than 756 and/or  diastolic greater than 433).   levofloxacin 750 MG/150ML Soln Commonly known as: LEVAQUIN Inject 150 mLs (750 mg total) into the vein every other day. Start taking on: August 11, 2022   ondansetron 4 MG/2ML Soln injection Commonly known as: ZOFRAN Inject 2 mLs (4 mg total) into the vein every 6 (six) hours as needed for nausea or vomiting.   Synthroid 100 MCG tablet Generic drug: levothyroxine Take 1 tablet (100 mcg total) by mouth daily before breakfast.           Discharge Exam: Filed Weights   08/09/22 1026  Weight: 68 kg    General: Pt is alert, awake, not in acute distress Cardiovascular: RRR, nl S1-S2, no murmurs appreciated.   No LE edema.   Respiratory: Normal respiratory rate and rhythm.  CTAB without rales or wheezes. Abdominal: Abdomen soft and non-tender.  No distension or HSM.   Neuro/Psych: Strength symmetric in upper and lower extremities.  Judgment and insight appear normal.   Condition at discharge: good  The results of significant diagnostics from this hospitalization (including imaging, microbiology,  ancillary and laboratory) are listed below for reference.   Imaging Studies: US RENAL  Result Date: 08/10/2022 CLINICAL DATA:  Acute renal insufficiency EXAM: RENAL / URINARY TRACT ULTRASOUND COMPLETE COMPARISON:  None Available. FINDINGS: Right Kidney: Renal measurements: 10.1 x 4.1 x 4.6 cm = volume: 100 mL. Echogenicity within normal limits. No mass or hydronephrosis visualized. Left Kidney: Renal measurements: 9.8 x 4.4 x 4.4 cm = volume: 99.2 mL. Echogenicity within normal limits. No mass or hydronephrosis visualized. Bladder: Appears normal for degree of bladder distention. Other: None. IMPRESSION: Normal study. No cause for acute renal insufficiency identified. Electronically Signed   By: Dorise Bullion III M.D.   On: 08/10/2022 18:04   CT CHEST WO CONTRAST  Result Date: 08/10/2022 CLINICAL DATA:  Shortness of breath.  History of breast cancer.  EXAM: CT CHEST WITHOUT CONTRAST TECHNIQUE: Multidetector CT imaging of the chest was performed following the standard protocol without IV contrast. RADIATION DOSE REDUCTION: This exam was performed according to the departmental dose-optimization program which includes automated exposure control, adjustment of the mA and/or kV according to patient size and/or use of iterative reconstruction technique. COMPARISON:  August 09, 2022.  February 28, 2022. FINDINGS: Cardiovascular: No evidence of thoracic aortic aneurysm. Mild cardiomegaly is noted. Mild to moderate pericardial effusion is noted. Mediastinum/Nodes: No enlarged mediastinal or axillary lymph nodes. Thyroid gland, trachea, and esophagus demonstrate no significant findings. Lungs/Pleura: Small bilateral pleural effusions are noted. Minimal bibasilar pulmonary edema or subsegmental atelectasis is noted. No pneumothorax is noted. Upper Abdomen: No acute abnormality. Musculoskeletal: Stable old left rib fractures are noted. No acute osseous abnormality is noted. IMPRESSION: Mild to moderate size pericardial effusion is now noted. Small bilateral pleural effusions are noted with associated minimal bibasilar pulmonary edema or subsegmental atelectasis. Electronically Signed   By: Marijo Conception M.D.   On: 08/10/2022 14:47   ECHOCARDIOGRAM COMPLETE  Result Date: 08/10/2022    ECHOCARDIOGRAM REPORT   Patient Name:   CHRYSA RAMPY Date of Exam: 08/10/2022 Medical Rec #:  [id number]          Height:       64.0 in Accession #:    [id number]         Weight:       150.0 lb Date of Birth:  1951-01-31          BSA:          1.731 m Patient Age:    26 years           BP:           165/88 mmHg Patient Gender: F                  HR:           104 bpm. Exam Location:  Inpatient Procedure: 2D Echo, Cardiac Doppler, Color Doppler and Intracardiac            Opacification Agent Indications:    Dyspnea  History:        Patient has prior history of Echocardiogram  examinations, most                 recent 05/14/2022. Signs/Symptoms:Dyspnea; Risk                 Factors:Dyslipidemia and GERD.  Sonographer:    Bernadene Person RDCS Referring Phys: [id number] [place] [facility]  Sonographer Comments: Image acquisition challenging due to breast implants. IMPRESSIONS  1. Left ventricular ejection fraction, by estimation,  is 60 to 65%. The left ventricle has normal function. The left ventricle has no regional wall motion abnormalities. There is mild left ventricular hypertrophy of the basal-septal segment. Left ventricular diastolic parameters are consistent with Grade I diastolic dysfunction (impaired relaxation).  2. Right ventricular systolic function is normal. The right ventricular size is normal.  3. Moderate pericardial effusion. There is no evidence of cardiac tamponade.  4. The mitral valve is normal in structure. Trivial mitral valve regurgitation. No evidence of mitral stenosis.  5. The aortic valve is tricuspid. Aortic valve regurgitation is trivial. No aortic stenosis is present.  6. The inferior vena cava is normal in size with greater than 50% respiratory variability, suggesting right atrial pressure of 3 mmHg. Comparison(s): No prior Echocardiogram. FINDINGS  Left Ventricle: Left ventricular ejection fraction, by estimation, is 60 to 65%. The left ventricle has normal function. The left ventricle has no regional wall motion abnormalities. Definity contrast agent was given IV to delineate the left ventricular  endocardial borders. The left ventricular internal cavity size was normal in size. There is mild left ventricular hypertrophy of the basal-septal segment. Left ventricular diastolic parameters are consistent with Grade I diastolic dysfunction (impaired relaxation). Right Ventricle: The right ventricular size is normal. Right ventricular systolic function is normal. Left Atrium: Left atrial size was normal in size. Right Atrium: Right atrial size was normal in size.  Pericardium: A moderately sized pericardial effusion is present. There is no evidence of cardiac tamponade. Mitral Valve: The mitral valve is normal in structure. Trivial mitral valve regurgitation. No evidence of mitral valve stenosis. Tricuspid Valve: The tricuspid valve is normal in structure. Tricuspid valve regurgitation is trivial. No evidence of tricuspid stenosis. Aortic Valve: The aortic valve is tricuspid. Aortic valve regurgitation is trivial. No aortic stenosis is present. Pulmonic Valve: The pulmonic valve was not well visualized. Pulmonic valve regurgitation is not visualized. No evidence of pulmonic stenosis. Aorta: The aortic root is normal in size and structure. Venous: The inferior vena cava is normal in size with greater than 50% respiratory variability, suggesting right atrial pressure of 3 mmHg. IAS/Shunts: No atrial level shunt detected by color flow Doppler.  LEFT VENTRICLE PLAX 2D LVIDd:         4.40 cm   Diastology LVIDs:         2.70 cm   LV e' medial:    6.30 cm/s LV PW:         0.90 cm   LV E/e' medial:  14.9 LV IVS:        0.90 cm   LV e' lateral:   7.88 cm/s LVOT diam:     2.00 cm   LV E/e' lateral: 11.9 LV SV:         65 LV SV Index:   37 LVOT Area:     3.14 cm  RIGHT VENTRICLE RV S prime:     16.30 cm/s TAPSE (M-mode): 1.8 cm LEFT ATRIUM             Index        RIGHT ATRIUM           Index LA diam:        3.50 cm 2.02 cm/m   RA Area:     17.00 cm LA Vol (A2C):   55.8 ml 32.23 ml/m  RA Volume:   39.50 ml  22.82 ml/m LA Vol (A4C):   54.3 ml 31.37 ml/m LA Biplane Vol: 55.9 ml 32.29 ml/m  AORTIC  VALVE LVOT Vmax:   118.00 cm/s LVOT Vmean:  79.600 cm/s LVOT VTI:    0.206 m  AORTA Ao Root diam: 3.20 cm Ao Asc diam:  3.40 cm MITRAL VALVE MV Area (PHT): 3.48 cm    SHUNTS MV Decel Time: 218 msec    Systemic VTI:  0.21 m MV E velocity: 94.10 cm/s  Systemic Diam: 2.00 cm MV A velocity: 97.40 cm/s MV E/A ratio:  0.97 [name] MD Electronically signed by [name] MD Signature  Date/Time: 08/10/2022/10:49:37 AM    Final    [place] EKG SITE RITE  Result Date: 08/10/2022 If Site Rite image not attached, placement could not be confirmed due to current cardiac rhythm.  CT Head Wo Contrast  Result Date: 08/09/2022 CLINICAL DATA:  Hypertension, vertigo.  History of breast cancer EXAM: CT HEAD WITHOUT CONTRAST TECHNIQUE: Contiguous axial images were obtained from the base of the skull through the vertex without intravenous contrast. RADIATION DOSE REDUCTION: This exam was performed according to the departmental dose-optimization program which includes automated exposure control, adjustment of the mA and/or kV according to patient size and/or use of iterative reconstruction technique. COMPARISON:  None Available. FINDINGS: Brain: No evidence of acute infarction, hemorrhage, hydrocephalus, extra-axial collection or mass lesion/mass effect. Scattered low-density changes within the periventricular and subcortical white matter compatible with chronic microvascular ischemic change. Mild diffuse cerebral volume loss. Vascular: No hyperdense vessel or unexpected calcification. Skull: Normal. Negative for fracture or focal lesion. Sinuses/Orbits: Minimal partial right mastoid effusion. Otherwise clear. Other: None. IMPRESSION: 1. No acute intracranial abnormality. 2. Mild chronic microvascular ischemic change and cerebral volume loss. 3. Minimal partial right mastoid effusion. Electronically Signed   By: Davina Poke D.O.   On: 08/09/2022 12:28   DG Chest Portable 1 View  Result Date: 08/09/2022 CLINICAL DATA:  Shortness of breath. EXAM: PORTABLE CHEST 1 VIEW COMPARISON:  August 12, 2017. FINDINGS: Mild cardiomegaly is noted. Minimal right basilar subsegmental atelectasis is noted. Left basilar atelectasis or pneumonia is noted with probable associated loculated pleural effusion. Bony thorax is unremarkable. IMPRESSION: Mild left basilar atelectasis or pneumonia with probable associated  loculated left pleural effusion. Electronically Signed   By: Marijo Conception M.D.   On: 08/09/2022 12:24   MR BREAST BILATERAL W WO CONTRAST INC CAD  Result Date: 07/25/2022 CLINICAL DATA:  72 year old female with history of scleroderma and left breast cancer post lumpectomy 2013 followed by radiation therapy presents for high risk screening breast MRI. Patient does not have routine mammography due to her history of scleroderma. EXAM: BILATERAL BREAST MRI WITH AND WITHOUT CONTRAST TECHNIQUE: Multiplanar, multisequence MR images of both breasts were obtained prior to and following the intravenous administration of 7 ml of Gadavist Three-dimensional MR images were rendered by post-processing of the original MR data on an independent workstation. The three-dimensional MR images were interpreted, and findings are reported in the following complete MRI report for this study. Three dimensional images were evaluated at the independent interpreting workstation using the DynaCAD thin client. COMPARISON:  None available. FINDINGS: Breast composition: c.  Heterogeneous fibroglandular tissue. Background parenchymal enhancement: Moderate. Right breast: No mass or abnormal enhancement. Left breast: Stable appearance of lumpectomy changes in the left breast. No suspicious masses or abnormal areas of enhancement identified in the left breast. Lymph nodes: No abnormal appearing lymph nodes. Ancillary findings:  Cholelithiasis. IMPRESSION: No MRI evidence of malignancy in either breast. RECOMMENDATION: Recommend bilateral screening breast MRI in 1 year. BI-RADS CATEGORY  1: Negative. Electronically Signed  By: Everlean Alstrom M.D.   On: 07/25/2022 14:23   Microbiology: Results for orders placed or performed during the hospital encounter of 08/09/22  Resp panel by RT-PCR (RSV, Flu A&B, Covid) Anterior Nasal Swab     Status: None   Collection Time: 08/09/22 11:04 AM   Specimen: Anterior Nasal Swab  Result Value Ref Range  Status   SARS Coronavirus 2 by RT PCR NEGATIVE NEGATIVE Final    Comment: (NOTE) SARS-CoV-2 target nucleic acids are NOT DETECTED.  The SARS-CoV-2 RNA is generally detectable in upper respiratory specimens during the acute phase of infection. The lowest concentration of SARS-CoV-2 viral copies this assay can detect is 138 copies/mL. A negative result does not preclude SARS-Cov-2 infection and should not be used as the sole basis for treatment or other patient management decisions. A negative result may occur with  improper specimen collection/handling, submission of specimen other than nasopharyngeal swab, presence of viral mutation(s) within the areas targeted by this assay, and inadequate number of viral copies(<138 copies/mL). A negative result must be combined with clinical observations, patient history, and epidemiological information. The expected result is Negative.  Fact Sheet for Patients:  EntrepreneurPulse.com.au  Fact Sheet for Healthcare Providers:  IncredibleEmployment.be  This test is no t yet approved or cleared by the [place] FDA and  has been authorized for detection and/or diagnosis of SARS-CoV-2 by FDA under an Emergency Use Authorization (EUA). This EUA will remain  in effect (meaning this test can be used) for the duration of the COVID-19 declaration under Section 564(b)(1) of the Act, 21 U.S.C.section 360bbb-3(b)(1), unless the authorization is terminated  or revoked sooner.       Influenza A by PCR NEGATIVE NEGATIVE Final   Influenza B by PCR NEGATIVE NEGATIVE Final    Comment: (NOTE) The Xpert Xpress SARS-CoV-2/FLU/RSV plus assay is intended as an aid in the diagnosis of influenza from Nasopharyngeal swab specimens and should not be used as a sole basis for treatment. Nasal washings and aspirates are unacceptable for Xpert Xpress SARS-CoV-2/FLU/RSV testing.  Fact Sheet for  Patients: EntrepreneurPulse.com.au  Fact Sheet for Healthcare Providers: IncredibleEmployment.be  This test is not yet approved or cleared by the [place] FDA and has been authorized for detection and/or diagnosis of SARS-CoV-2 by FDA under an Emergency Use Authorization (EUA). This EUA will remain in effect (meaning this test can be used) for the duration of the COVID-19 declaration under Section 564(b)(1) of the Act, 21 U.S.C. section 360bbb-3(b)(1), unless the authorization is terminated or revoked.     Resp Syncytial Virus by PCR NEGATIVE NEGATIVE Final    Comment: (NOTE) Fact Sheet for Patients: EntrepreneurPulse.com.au  Fact Sheet for Healthcare Providers: IncredibleEmployment.be  This test is not yet approved or cleared by the [place] FDA and has been authorized for detection and/or diagnosis of SARS-CoV-2 by FDA under an Emergency Use Authorization (EUA). This EUA will remain in effect (meaning this test can be used) for the duration of the COVID-19 declaration under Section 564(b)(1) of the Act, 21 U.S.C. section 360bbb-3(b)(1), unless the authorization is terminated or revoked.  Performed at [facility], [address] [place], [place] [address]     Labs: CBC: Recent Labs  Lab 08/09/22 1150 08/10/22 0549  WBC 9.6 12.2*  NEUTROABS 7.7  --   HGB 12.7 12.7  HCT 37.4 38.5  MCV 85.6 87.9  PLT 125* 253*   Basic Metabolic Panel: Recent Labs  Lab 08/09/22 1150 08/09/22 2121 08/10/22 0549  NA 131*  --  134*  K 3.0*  --  3.4*  CL 98  --  101  CO2 24  --  20*  GLUCOSE 113*  --  133*  BUN 17  --  16  CREATININE 1.80*  --  1.74*  CALCIUM 8.7*  --  9.0  MG  --  2.1  --    Liver Function Tests: Recent Labs  Lab 08/09/22 1150  AST 47*  ALT 23  ALKPHOS 64  BILITOT 1.4*  PROT 6.9  ALBUMIN 3.7   CBG: No results for input(s): "GLUCAP" in the last 168  hours.  Discharge time spent: approximately 35 minutes spent on discharge counseling, evaluation of patient on day of discharge, and coordination of discharge planning with nursing, social work, pharmacy and case management  Signed: Edwin Dada, MD [facility] 08/10/2022

## 2022-08-10 NOTE — Progress Notes (Signed)
Echocardiogram 2D Echocardiogram has been performed.  Fidel Levy 08/10/2022, 10:28 AM

## 2022-08-10 NOTE — Consult Note (Signed)
Renal Service Consult Note [facility]  [name] 08/10/2022 Sol Blazing, MD Requesting Physician: Dr. Loleta Books  Reason for Consult: Renal failure, HTN HPI: The patient is a 72 y.o. year-old w/ hx of breast cancer (rx'd 2013), HTN and diffuse cutaneous systemic sclerosis/ scleroderma f/b [facility] who presented to ED w/ uncont HTN, headaches, cough going on for the past week or so. . In ED BP was 217/ 105, normal for her is 130/70.  Creat was up to 1.8 (b/l 1.1). Pt was given IV labetalol and hydralazine and po norvasc w/ slight BP improvement to 188/103. Pt was admitted and we are asked to see for renal failure.   Hx obtained mostly from the husband. Scleroderma has been the worst in her UE's/ hands, somewhat less on the legs, swallowing/ esophageal issues as well. She was on cellcept for sometime but it was stopped about 1 yr ago due to bad episode she had of shingles. On her recent visit they were thinking about going back on, but since her skin was getting a bit softer, they held off. She takes a number of OTC natural medicines as well.    Pt seen in CT scanner, tearful, mild SOB. No distress.     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 Ruthann Cancer at [facility]   systemic sclerosis , diffuse/   rheumotologist:  dr Manuella Ghazi at [facility]---  lov 07-22-2017 (as of 07-25-2017 note not available to read)---  per pt has appt. w/ specialist at [facility]   Skin thickening    hands, arms, legs  caused by scleroderma   Systemic sclerosis ([place])    UTI (urinary tract infection)    Past Surgical History  Past Surgical History:  Procedure Laterality Date   BIOPSY  01/29/2018   Procedure: BIOPSY;  Surgeon: Jerene Bears, MD;  Location: [facility];  Service: Gastroenterology;;   Lillard Anes  2000   Left   COLONOSCOPY WITH PROPOFOL N/A 01/29/2018   Procedure: COLONOSCOPY WITH PROPOFOL;  Surgeon: Jerene Bears, MD;  Location: [facility];  Service: Gastroenterology;  Laterality: N/A;   DILATATION & CURETTAGE/HYSTEROSCOPY WITH MYOSURE N/A 08/04/2017   Procedure: DILATATION & CURETTAGE/HYSTEROSCOPY WITH MYOSURE;  Surgeon: Hale Bogus  S, MD;  Location: [place];  Service: Gynecology;  Laterality: N/A;   ECTOPIC PREGNANCY SURGERY  1986-88   s/p unilateral salpingectomy   ESOPHAGOGASTRODUODENOSCOPY (EGD) WITH PROPOFOL N/A 01/29/2018   Procedure: ESOPHAGOGASTRODUODENOSCOPY (EGD) WITH PROPOFOL;  Surgeon: Jerene Bears, MD;  Location: [facility];  Service: Gastroenterology;  Laterality: N/A;   PARTIAL MASTECTOMY WITH AXILLARY SENTINEL LYMPH NODE BIOPSY Left 11-04-2011   dr Margot Chimes  [facility]   TRANSTHORACIC ECHOCARDIOGRAM  06-04-2017    [facility]   moderate LVH, ef>55%/  trivial AR and TR/ mild MR and PR/  trivial pericardial effusion   WISDOM TOOTH EXTRACTION      Family History  Family History  Problem Relation Age of Onset   Heart disease Mother    Heart failure Mother    Heart disease Father    Ovarian cancer Maternal Aunt    Heart disease Paternal Uncle        multiple   Autoimmune disease Sister    Arrhythmia Sister    Anemia Sister    CAD Brother    Colon cancer Neg Hx    Stomach cancer Neg Hx    Esophageal cancer Neg Hx    Rectal cancer Neg Hx    Social History  reports that she has never smoked. She has never used smokeless tobacco. She reports current alcohol use. She reports that she does not use drugs. Allergies  Allergies  Allergen Reactions   Betadine [Povidone Iodine] Rash   Contrast Media [Iodinated Contrast Media] Rash and Other (See Comments)    "per pt: recently had CT 08/ 2018 even through given pre-medication, IVP still caused rash"   Epinephrine Other (See Comments)    Severe headaches    Iodine Rash and Other (See Comments)   Oxycodone Anxiety   Gabapentin Other (See Comments)    Dizziness   Lidocaine Rash   Nickel Rash and Other (See Comments)    Rash and blisters   Other Rash and Other (See Comments)    Hospital gown   Penicillins Rash and Other (See Comments)    Rash and blisters Has patient had a PCN reaction causing immediate rash, facial/tongue/throat swelling, SOB or lightheadedness with hypotension: Yes Has patient had a PCN reaction causing severe rash involving mucus membranes or skin necrosis: No Has patient had a PCN reaction that required hospitalization: No Has patient had a PCN reaction occurring within the last 10 years: No If all of the above answers are "NO", then may proceed with Cephalosporin use.    Sulfa Antibiotics Rash   Home medications Prior to Admission medications   Medication Sig Start Date End Date Taking? Authorizing Provider  acetaminophen (TYLENOL) 650 MG CR tablet Take 650-1,300 mg by mouth 2 (two) times daily as needed for pain or fever. 02/13/19  Yes [provider]  ADVIL MULTI-SYMPTOM COLD & FLU 4-10-200 MG TABS Take 1 tablet by mouth every 8 (eight) hours as needed (for flu-like symptoms).   Yes [provider]  ELDERBERRY PO Take 15 mLs by mouth daily.   Yes [provider]  furosemide (LASIX) 20 MG tablet Take 20-40 mg by mouth daily. 12/01/16  Yes [provider]  OVER THE COUNTER MEDICATION Take 4-8 capsules by mouth See admin instructions. Relief Factor capsules- Take 4-8 capsules by mouth daily   Yes [provider]  sodium chloride (OCEAN) 0.65 % SOLN nasal spray Place 1 spray into both nostrils as needed for congestion.   Yes [provider]  SYNTHROID 100 MCG tablet Take 1 tablet (100 mcg total) by mouth daily before breakfast. 07/18/22  Yes Copland, Gay Filler, MD  triamcinolone cream (KENALOG) 0.1 % Apply 1 Application topically 2 (two) times daily as needed (to afected areas- for itching).   Yes [provider]  Demetra Shiner SEVERE 0.05 % nasal spray Place 1 spray into both nostrils 2 (two) times daily as needed for congestion.   Yes [provider]     Vitals:   08/10/22 0256 08/10/22 0508 08/10/22 0900 08/10/22 1227  BP: (!) 163/90 (!) 165/88 (!) 169/100 (!) 186/92  Pulse: (!) 103 (!) 110 (!) 102 99  Resp:  '20 20 20  '$ Temp:  97.9 F (36.6 C) 98.2 F (36.8 C) 98.6 F (37 C)  TempSrc:  Oral Oral Oral  SpO2:  95%  96%  Weight:      Height:       Exam Gen alert, no distress Sclera anicteric, throat clear  No jvd or bruits Chest loud crackles 1/3 up bilat RRR no MRG Abd soft ntnd no mass or ascites +bs GU defer MS no joint effusions or deformity Ext no LE or UE edema, no wounds or ulcers Neuro is alert, Ox 3 , nf      Home meds include - advil prn, lasix 20-40 mg qd, synthroid 100 mcg po, prns/ vits/ supps     Date   Creat  eGFR    2013- 2018  1.00- 1.20    2019- 2022  1.02- 1.24 44- 56 ml/min, CKD 3a    Aug 2023  1.10  53      05/22/22  1.11  52     08/09/22   1.80      08/10/22  1.74  31 ml/min       Na 134  K 3.4  CO2 20  BUN 16  creat 1.80    UA prot 30, rare bact, 0-5 wbc/ rbc     CT chest noncon- IMPRESSION:   Mild to moderate size pericardial effusion is now noted.     Small bilateral pleural effusions are noted with associated minimal   bibasilar pulmonary edema or subsegmental atelectasis.    CXR 1/26 - L pna or effusion; my read is possible vasc congestion as well  Assessment/ Plan: AKI on CKD 3a - b/l creat 1.11 from nov 2023, eGFR 52 ml/min. Creat here is 1.80 in setting on new onset severe HTN, hx of scleroderma w/ diffuse skin involvement. All this is c/w scleroderma renal crisis [facility]) and should be treated as you are doing w/ captopril (oral ace inhibitor) w/ titration of dosing from 12.5 tid up to max 100-150 mg po tid until BP comes back to her normal (130/70) w/ goal of reaching this BP control over a 72 hr period. Treating the BP hopefully should improve renal function over time. 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 Agree w/ dosing, prn IV hydralazine is good. Avoid BB or labetalol for this patient. Will follow.  Uncont HTN - suspect due to scleroderma renal crisis which results in severe acute HTN w/ renal failure which can be managed by ace inhibition primarily +/- other BP lowering meds as needed if already maxed out on captopril.  Scleroderma - w/ diffuse skin involvement, this is a risk factor for [place] Volume - no gross edema on exam      Kelly Splinter  MD 08/10/2022, 2:48 PM Recent Labs  Lab 08/09/22 1150 08/10/22 0549  HGB 12.7 12.7  ALBUMIN 3.7  --   CALCIUM 8.7* 9.0  CREATININE 1.80* 1.74*  K 3.0* 3.4*   Inpatient medications:  amLODipine  2.5 mg Oral BID   captopril  12.5 mg Oral Q8H   enoxaparin (LOVENOX) injection  30 mg Subcutaneous QHS   furosemide  40 mg Intravenous BID   levothyroxine  100 mcg Oral Q0600    [START ON 08/11/2022] levofloxacin (LEVAQUIN) IV     acetaminophen,  fentaNYL (SUBLIMAZE) injection, hydrALAZINE, ondansetron (ZOFRAN) IV

## 2022-08-10 NOTE — Hospital Course (Addendum)
Mrs. Peggs is a 72 y.o. F with diffuse cutaneous systemic sclerosis, hypothyroidism, HTN and BrCA remote who presented with 2 weeks malaise, dysphagia, cough, DOE and now severe hypertension and headache.  Had been at baseline until about 2-3 weeks ago, started feeling generally bad, notable increased dysphagia, increased "skin tightness" and severe headaches.  This progresed to dyspnea on exertion, cough and fatigue.    On day of admission, checked BP and was >220 mmHg so came to the ER.    1/26: BP in ER 218/103, Cr 1.8 (from baseline 1.1), given IV labetalol and hydralazine and amditted 1/27: Nephrology for ?renal crisis; GI consulted for dysphagia

## 2022-08-10 NOTE — Progress Notes (Signed)
Report called to receiving RN at [facility]. All questions answered, verbalized understanding. [facility] arrived to transport patient. Husband and patient updated at bedside. Agree with plan of care. Pt left floor via stretcher in stable condition accompanied by [facility] staff and family.

## 2022-08-10 NOTE — Progress Notes (Signed)
Peripherally Inserted Central Catheter Placement  The IV Nurse has discussed with the patient and/or persons authorized to consent for the patient, the purpose of this procedure and the potential benefits and risks involved with this procedure.  The benefits include less needle sticks, lab draws from the catheter, and the patient may be discharged home with the catheter. Risks include, but not limited to, infection, bleeding, blood clot (thrombus formation), and puncture of an artery; nerve damage and irregular heartbeat and possibility to perform a PICC exchange if needed/ordered by physician.  Alternatives to this procedure were also discussed.  Bard Power PICC patient education guide, fact sheet on infection prevention and patient information card has been provided to patient /or left at bedside.  Very lengthy discussion with pt, husband, dtr, SIL and grandchildren at bedside.  All options discussed especially regarding allergy to lidocaine.  PICC Placement Documentation  PICC Single Lumen 04/21/11 Right Basilic 35 cm 0 cm (Active)  Indication for Insertion or Continuance of Line Poor Vasculature-patient has had multiple peripheral attempts or PIVs lasting less than 24 hours 08/10/22 1811  Exposed Catheter (cm) 0 cm 08/10/22 1811  Site Assessment Clean, Dry, Intact 08/10/22 1811  Line Status Flushed;Saline locked;Blood return noted 08/10/22 1811  Dressing Type Transparent;Securing device 08/10/22 1811  Dressing Status Antimicrobial disc in place;Clean, Dry, Intact 08/10/22 1811  Safety Lock Not Applicable 19/75/88 3254  Line Care Connections checked and tightened 08/10/22 1811  Line Adjustment (NICU/IV Team Only) No 08/10/22 1811  Dressing Intervention New dressing 08/10/22 1811  Dressing Change Due 08/17/22 08/10/22 1811       Rolena Infante 08/10/2022, 6:12 PM

## 2022-08-10 NOTE — Assessment & Plan Note (Signed)
CT and echo today showed moderate pericardial effusion without tamponade.

## 2022-08-12 NOTE — Telephone Encounter (Signed)
Pt went to ER

## 2022-08-13 ENCOUNTER — Encounter: Payer: Medicare Other | Admitting: Gastroenterology

## 2022-08-15 ENCOUNTER — Telehealth: Payer: Self-pay | Admitting: Gastroenterology

## 2022-08-15 NOTE — Telephone Encounter (Signed)
PT needs updated prep instructions sent to mychart. She rescheduled her colonoscopy for 09/04/2022. Please advise

## 2022-08-15 NOTE — Telephone Encounter (Signed)
The pt has been advised that new instructions have been entered

## 2022-08-18 NOTE — Addendum Note (Signed)
Addended by: Darreld Mclean on: 08/18/2022 06:55 PM   Modules accepted: Orders

## 2022-08-19 MED ORDER — PANTOPRAZOLE SODIUM 40 MG PO TBEC: 40.0000 mg | DELAYED_RELEASE_TABLET | Freq: Two times a day (BID) | ORAL | 3 refills | Status: DC

## 2022-08-19 NOTE — Addendum Note (Signed)
Addended by: Lamar Blinks C on: 08/19/2022 12:22 PM   Modules accepted: Orders

## 2022-08-22 ENCOUNTER — Other Ambulatory Visit: Payer: Medicare Other

## 2022-08-22 ENCOUNTER — Other Ambulatory Visit (INDEPENDENT_AMBULATORY_CARE_PROVIDER_SITE_OTHER): Payer: Medicare Other

## 2022-08-22 ENCOUNTER — Encounter: Payer: Self-pay | Admitting: Family Medicine

## 2022-08-22 DIAGNOSIS — M349 Systemic sclerosis, unspecified: Secondary | ICD-10-CM

## 2022-08-22 LAB — CBC
HCT: 33.5 % — ABNORMAL LOW (ref 36.0–46.0)
Hemoglobin: 11.2 g/dL — ABNORMAL LOW (ref 12.0–15.0)
MCHC: 33.6 g/dL (ref 30.0–36.0)
MCV: 88.3 fl (ref 78.0–100.0)
Platelets: 396 10*3/uL (ref 150.0–400.0)
RBC: 3.79 Mil/uL — ABNORMAL LOW (ref 3.87–5.11)
RDW: 18 % — ABNORMAL HIGH (ref 11.5–15.5)
WBC: 10.3 10*3/uL (ref 4.0–10.5)

## 2022-08-22 LAB — BASIC METABOLIC PANEL
BUN: 17 mg/dL (ref 6–23)
CO2: 22 mEq/L (ref 19–32)
Calcium: 9.5 mg/dL (ref 8.4–10.5)
Chloride: 103 mEq/L (ref 96–112)
Creatinine, Ser: 2.03 mg/dL — ABNORMAL HIGH (ref 0.40–1.20)
GFR: 23.93 mL/min — ABNORMAL LOW (ref >60.00)
Glucose, Bld: 84 mg/dL (ref 70–99)
Potassium: 4.2 mEq/L (ref 3.5–5.1)
Sodium: 137 mEq/L (ref 135–145)

## 2022-08-23 LAB — LACTATE DEHYDROGENASE: LDH: 289 U/L — ABNORMAL HIGH (ref 120–250)

## 2022-08-23 LAB — HAPTOGLOBIN: Haptoglobin: 134 mg/dL (ref 43–212)

## 2022-08-26 DIAGNOSIS — J90 Pleural effusion, not elsewhere classified: Secondary | ICD-10-CM | POA: Diagnosis not present

## 2022-08-26 DIAGNOSIS — I3139 Other pericardial effusion (noninflammatory): Secondary | ICD-10-CM | POA: Diagnosis not present

## 2022-08-26 DIAGNOSIS — M349 Systemic sclerosis, unspecified: Secondary | ICD-10-CM | POA: Diagnosis not present

## 2022-08-27 DIAGNOSIS — Z79899 Other long term (current) drug therapy: Secondary | ICD-10-CM | POA: Diagnosis not present

## 2022-08-27 DIAGNOSIS — I7300 Raynaud's syndrome without gangrene: Secondary | ICD-10-CM | POA: Diagnosis not present

## 2022-08-27 DIAGNOSIS — N280 Ischemia and infarction of kidney: Secondary | ICD-10-CM | POA: Diagnosis not present

## 2022-08-27 DIAGNOSIS — Z853 Personal history of malignant neoplasm of breast: Secondary | ICD-10-CM | POA: Diagnosis not present

## 2022-08-27 DIAGNOSIS — M349 Systemic sclerosis, unspecified: Secondary | ICD-10-CM | POA: Diagnosis not present

## 2022-08-28 DIAGNOSIS — I7300 Raynaud's syndrome without gangrene: Secondary | ICD-10-CM | POA: Diagnosis not present

## 2022-08-28 DIAGNOSIS — R9389 Abnormal findings on diagnostic imaging of other specified body structures: Secondary | ICD-10-CM | POA: Diagnosis not present

## 2022-08-28 DIAGNOSIS — M349 Systemic sclerosis, unspecified: Secondary | ICD-10-CM | POA: Diagnosis not present

## 2022-08-28 DIAGNOSIS — Z79899 Other long term (current) drug therapy: Secondary | ICD-10-CM | POA: Diagnosis not present

## 2022-09-02 ENCOUNTER — Encounter: Payer: Self-pay | Admitting: Family Medicine

## 2022-09-02 DIAGNOSIS — M349 Systemic sclerosis, unspecified: Secondary | ICD-10-CM

## 2022-09-03 ENCOUNTER — Encounter: Payer: Self-pay | Admitting: Hematology & Oncology

## 2022-09-03 NOTE — Telephone Encounter (Signed)
Dr. Lorelei Pont how many week do we need to schedule appointments for [facility] Kelsay standing labs?

## 2022-09-04 ENCOUNTER — Encounter: Payer: Medicare Other | Admitting: Gastroenterology

## 2022-09-04 ENCOUNTER — Other Ambulatory Visit (INDEPENDENT_AMBULATORY_CARE_PROVIDER_SITE_OTHER): Payer: Medicare Other

## 2022-09-04 DIAGNOSIS — M349 Systemic sclerosis, unspecified: Secondary | ICD-10-CM

## 2022-09-04 LAB — CBC
HCT: 35.9 % — ABNORMAL LOW (ref 36.0–46.0)
Hemoglobin: 11.9 g/dL — ABNORMAL LOW (ref 12.0–15.0)
MCHC: 33.1 g/dL (ref 30.0–36.0)
MCV: 89.9 fl (ref 78.0–100.0)
Platelets: 318 10*3/uL (ref 150.0–400.0)
RBC: 3.99 Mil/uL (ref 3.87–5.11)
RDW: 18.5 % — ABNORMAL HIGH (ref 11.5–15.5)
WBC: 8.7 10*3/uL (ref 4.0–10.5)

## 2022-09-05 ENCOUNTER — Telehealth: Payer: Self-pay | Admitting: Internal Medicine

## 2022-09-05 ENCOUNTER — Encounter: Payer: Self-pay | Admitting: Internal Medicine

## 2022-09-05 ENCOUNTER — Encounter: Payer: Self-pay | Admitting: Family Medicine

## 2022-09-05 ENCOUNTER — Ambulatory Visit (INDEPENDENT_AMBULATORY_CARE_PROVIDER_SITE_OTHER): Payer: Medicare Other | Admitting: Internal Medicine

## 2022-09-05 ENCOUNTER — Other Ambulatory Visit: Payer: Self-pay

## 2022-09-05 ENCOUNTER — Encounter: Payer: Self-pay | Admitting: Hematology & Oncology

## 2022-09-05 VITALS — BP 102/56 | HR 88 | Temp 98.1°F | Ht 64.5 in | Wt 153.4 lb

## 2022-09-05 DIAGNOSIS — Z170 Estrogen receptor positive status [ER+]: Secondary | ICD-10-CM

## 2022-09-05 DIAGNOSIS — M349 Systemic sclerosis, unspecified: Secondary | ICD-10-CM

## 2022-09-05 DIAGNOSIS — Z760 Encounter for issue of repeat prescription: Secondary | ICD-10-CM

## 2022-09-05 DIAGNOSIS — N280 Ischemia and infarction of kidney: Secondary | ICD-10-CM | POA: Diagnosis not present

## 2022-09-05 DIAGNOSIS — J849 Interstitial pulmonary disease, unspecified: Secondary | ICD-10-CM | POA: Diagnosis not present

## 2022-09-05 DIAGNOSIS — J986 Disorders of diaphragm: Secondary | ICD-10-CM

## 2022-09-05 DIAGNOSIS — I3139 Other pericardial effusion (noninflammatory): Secondary | ICD-10-CM

## 2022-09-05 DIAGNOSIS — I158 Other secondary hypertension: Secondary | ICD-10-CM

## 2022-09-05 LAB — COMPREHENSIVE METABOLIC PANEL
ALT: 18 U/L (ref 0–35)
AST: 43 U/L — ABNORMAL HIGH (ref 0–37)
Albumin: 4.5 g/dL (ref 3.5–5.2)
Alkaline Phosphatase: 61 U/L (ref 39–117)
BUN: 23 mg/dL (ref 6–23)
CO2: 22 mEq/L (ref 19–32)
Calcium: 9.8 mg/dL (ref 8.4–10.5)
Chloride: 104 mEq/L (ref 96–112)
Creatinine, Ser: 1.9 mg/dL — ABNORMAL HIGH (ref 0.40–1.20)
GFR: 25.9 mL/min — ABNORMAL LOW (ref >60.00)
Glucose, Bld: 97 mg/dL (ref 70–99)
Potassium: 4.6 mEq/L (ref 3.5–5.1)
Sodium: 138 mEq/L (ref 135–145)
Total Bilirubin: 0.5 mg/dL (ref 0.2–1.2)
Total Protein: 7.1 g/dL (ref 6.0–8.3)

## 2022-09-05 LAB — CK: Total CK: 475 U/L — ABNORMAL HIGH (ref 7–177)

## 2022-09-05 NOTE — Patient Instructions (Addendum)
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 Progessed in 2023 when off cellcept. Still o2 levels holding up with current walk test  Plan  - cellcept via [facility] for SSc renal crisis and general SSc treatment is beneficial  for the lungs   - so continue this - will need to consider starting you on Ofev within next 8 weeks or so   - will discuss extent of lung involvement with our radiologist + also Dr Aurora Mask at [facility]  - but given progression low threshold to start (dose adjsut for reduced renal function)(  - CMA will request upload a few of high-resolution CT chest Feb 2024 [facility] Scan  -Do spirometry and DLCO in the next 6 weeks  Elevated diaphragm  -You might have elevation of the left hemidiaphragm and this could be contributing to her shortness of breath  Plan - Do sniff test  Pericardial effusion  -We need to make sure there is no negative effects of the pericardial effusion on your heart.  In addition you need to rule out pulmonary hypertension from scleroderma.  You also need guidance for your blood pressure control and your diuresis  Plan - Recommend referral to Dr. Loralie Champagne or  Dr. Glori Bickers with the heart failure service  -I personally sent a message to them  Acute scleroderma renal crisis [facility]) -January 2024 in the setting of stopping CellCept because of shingles  -Creatinine yesterday was greater than 2 mg percent  Plan - d/w  Dr. Wille GlaserMarval Regal  over phone - he will try to get you in soon  Other secondary hypertension Encounter for medication refill  -At your request we will refill your cholecalciferol, valacyclovir amlodipine and lisinopril.  -30-day supply with 3 refills but after that you will get this filled by the primary prescriber -Because your blood pressure is improved you can take amlodipine at half dose [5 mg] per day and monitor  your blood pressure  Preoperative respiratory exam prior to endoscopy  -Agree with [facility] recommendation to hold off on endoscopy due to presence of pericardial effusion  Plan - We will be able to get clearance for the endoscopy after your evaluation by cardiology services  Follow-up - 6 weeks follow-up with Dr. Chase Caller but after spirometry and DLCO  -30-minute visit [symptom score and walking desaturation test at follow-up.

## 2022-09-05 NOTE — Telephone Encounter (Signed)
[place] Spizzirri needs her [facility] CT chest uploaded into PACS system. YOu can ask Clarisa Fling or Magda Paganini for help  THanks    SIGNATURE    Dr. Brand Males, M.D., F.C.C.P,  Pulmonary and Critical Care Medicine Staff Physician, [place] Director - Interstitial Lung Disease  Program  Medical Director - [place] ICU Pul[facility] [place] at [place], [place], [address]   Pager: [phone], If no answer  -[place] or [place] [phone] Telephone (clinical office): [phone] Telephone (research): [phone]  4:20 PM 09/05/2022

## 2022-09-05 NOTE — Telephone Encounter (Signed)
[place] and [place] think deserves new consult with you to start with.  She is well-known to Dr. Asencion Noble.  She has scleroderma for 7 years.  She was on CellCept but approximately a year ago she had shingles and the CellCept was stopped.  Then January 2024 was admitted with renal crisis in the absence of CellCept.  She was transferred to [facility].  She is now back on CellCept.  But she has new onset pericardial effusion and also pleural effusions since admission in January 2024.  She also has new onset ILD.  She has been seen by her regular doctors at [facility] and at [place].  They recommended endoscopy but this is on hold because of the presence of pericardial and pleural effusions.  She is also in renal crisis and is due to see Dr. Marval Regal soon  Plan - I think she will benefit from a right heart catheterization both look at tamponade physiology and also pulmonary hypertension WHO group 3 versus Group 1 -I will leave this decision up to you

## 2022-09-05 NOTE — Telephone Encounter (Signed)
[name] -let her know that after she left I did a detailed chart review.  I actually think she has ILD that has been there since 2019 but last year he did get worse after years of stability.  I am reaching out to the radiologist to see what percentage of lung if she has ILD and with that we will inform neck steps such as starting nintedanib  I also spoke to nephrologist Dr. Marval Regal and he will see the patient  I have also sent message to Dr. Loralie Champagne and [place] and they will reach out to her.  These are updates.  I tried to call her but it went to voicemail     SIGNATURE    Dr. Brand Males, M.D., F.C.C.P,  Pulmonary and Critical Care Medicine Staff Physician, [place] Director - Interstitial Lung Disease  Program  Medical Director - [place] ICU Pulmonary [place] at [place], [place], [address]   Pager: [phone], If no answer  -[place] or [place] [phone] Telephone (clinical office): [phone] Telephone (research): [phone]  6:55 PM 09/05/2022

## 2022-09-05 NOTE — Progress Notes (Signed)
08/27/22 [facility] Dr Rae Mar Fortuna: 1. Interstitial lung disease/fibrosis. Nonspecific patchy bilateral groundglass opacities. 2. Moderate pericardial effusion. 3. Small left pleural effusion, trace right pleural effusion. 4. Mediastinal adenopathy.  Assessment and Plan Ms. Huestis is a 72 y.o. 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 The patient also has a history of left breast cancer. Since her last visit, she has evidence of increased SSc disease activity as manifested by skin thickening, oropharyngeal dysphagia, tendon friction rubs and scleroderma renal crisis. Her course has also been complicated by worsening lower extremity edema. It is unclear what is driving her worsening disease activity, though notably she had been off of MMF for a period of time prior to the recurrence of her symptoms. Notably, she has an elevated CA 27.29 in the past - which makes one wonder about subclinical malignancy driving her symptoms.  Cutaneous: Skin score today was 17, a significant increase from her score of 3 at our last visit; we note that some of this is confounded by her lower extremity edema. However, despite this, we are in agreement that her skin score has worsened. She has been restarted on mycophenolate mofetil, and is currently on 1000 mg BID. We discussed that given her renal function we would not increase this further. She does have a history of difficult to treat shingles (lasted 9 months) when she previously was on MMF. She has been placed on valacyclovir prophylaxis. We discussed that it would likely take a few months to see the full effect of this medication. - Continue MMF 1000 mg BID - Monitoring labs with local rheumatologist  Raynaud's phenomenon: Her symptoms are stable. She  is on amlodipine for blood pressure management which may have additional benefit for her RP.  Cardiopulmonary: CT scan demonstrated bilateral ground glass opacities; however, these are non-specific in the setting of her volume overload. In addition, her PFTs are difficult to interpret in the setting of her known volume overload as well. Once she is euvolemic, these tests should be repeated. She is also planned to see Dr. Aurora Mask in pulmonary in our Center tomorrow.  Gastrointestinal: She has GERD which is stable on pantoprazole 40 mg daily. Barium swallow demonstrated cricopharyngeal bar. She is planned for endoscopy next week to further evaluate her oropharyngeal dysphagia.  MSK: She has evidence of tendon friction rubs on exam, though she denies significant arthralgias. No evidence of inflammatory arthritis. She notes generalized weakness in the setting of her recent hospitalization but her strength is intact on exam.  Renal: She recently was hospitalized in the setting of scleroderma renal crisis. Her blood pressure today was stable at 122/68, and she has been monitoring her blood pressure closely at home. She is currently managed with lisinopril 40 mg and amlodipine 10 mg . She does not currently have a nephrologist and we strongly recommended that she establish care with one. We will reach out to Dr. Manuella Ghazi at [facility] to help coordinate care.  Patient was evaluated with Dr. Manuella Ghazi after which the above assessment and plan was formulated. Please see attending attestation for further details.    08/28/2022 Dr. Ethelle Lyon -[facility] interstitial lung disease center   Rheumatologist: Dr. Manuella Ghazi Primary pulmonologist, if applicable: Seen previously in the [place] [facility]D center by Dr. Ruthann Cancer  [id number] [place] is a 72 y.o. female with a PMH of SSc and ILD  who presents for an initial evaluation regarding dyspnea. She is followed by Dr. Manuella Ghazi and was first diagnosed with scleroderma in 2017  based on a high titer ANA and skin thickening. She has been followed previously in the [place] [facility] center, with her most recent office note being in 2020. In terms of her respiratory history, she had a dry cough that developed in 2018 and had a chest CT done that year which showed bibasilar interstitial infiltrates consistent with an NSIP pattern. She was started on CellCept in December 2018 with significant improvement in [facility] and diffusion capacity. Her CellCept was stopped in January 2023 due to prolonged episode of shingles.  She was recently admitted to [facility] for scleroderma renal crisis. By the time of discharge, she had improvement but not normalization of her creatinine. Blood pressure was better controlled. Mycophenolate was added back due to a worsening of her skin disease (now on 1060m BID). Since she was discharged from the hospital, she feels like she is slowly improving but continues to have increased edema.  Was able to walk up and down stairs easily in October, when she had an anniversary party  Since her hospitalization, she has had trouble walking up her steps.   Impression: Ms. RPannieris a 89y.o. 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 She had signs of mild interstitial lung disease on the CAT scan report from 2018. She was started on CellCept at that point and had a significant improvement in the forced vital capacity. Overall, she feels like her breathing has been worse since July 2023. [facility] Her creatinine continues to improve, but she still exhibits signs of volume overload both on exam and imaging with a moderate pericardial effusion and small bilateral pleural effusions. Her breathing symptoms have been worse in the context and her pulmonary function tests this week are challenging to interpret with her acute recent hospitalization. However,  [facility] which [facility] to her hospitalization that showed a pattern of worsening in terms of her FVC and DLCO. So it clearly appears that she had progressive ILD even before her scleroderma renal crisis. She started back on CellCept 1000 mg twice per day. Dr. (Rae Mar SManuella Ghaziwould like her to stay on this dose rather than increase it to 1500 mg twice per day because of her kidney function.  Plan: -Continue CellCept. Agree with the current dosing, but as her creatinine improves I would prefer to get her up to 1500 mg twice per day -Repeat PFTs in 3 months - I note that her CK and inflammatory markers are elevated. This is likely in the context of her renal crisis. I will talk to Dr. SManuella Ghaziabout IVIG, although the amount of volume would not be well-tolerated at this point. -Continue furosemide through the weekend. [facility] a sooner appointment with a nephrologist, who will be directing her diuretic therapy. For now I would suggest getting weekly creatinine values until she can see nephrology   OV 09/05/2022 - new consult = referred by Dr PAsencion Noble Subjective:  Patient ID: DVilla Herb female , DOB: [birth year], age 72y.o. , MRN: [id number], [id number] Cop[place], JGay Filler MD Patient Care Team: CDarreld Mclean MD as PCP - General (Family Medicine) PAlda Berthold  DO as Consulting Physician (Neurology) Rod Holler, MD Lucy Antigua, MD as Referring Physician (Rheumatology) Charlie Pitter, MD as Referring Physician (Pulmonary Disease) Megan Salon, MD as Consulting Physician (Gynecology) Artis Delay, MD as Referring Physician (Internal Medicine) Jarome Matin, MD as Consulting Physician (Dermatology) Megan Salon, MD as Consulting Physician (Gynecology) Pyrtle, Lajuan Lines, MD as Consulting Physician (Gastroenterology) [name] Rudell Cobb, MD as Medical  Oncologist (Oncology)  This Provider for this visit: Treatment Team:  Attending Provider: Brand Males, MD    09/05/2022 -   Chief Complaint  Patient presents with   Consult    Scleroderma      HPI ANNSLEE MCFEE 72 y.o. -history is from the patient, husband acting as independent historian, detailed review of the external medical records [place] and also admissions and also [facility].  The pertinent details from recent [place] visit with both rheumatology and pulmonary is detailed above.  Patient is personal friend of Carmie End who is known to me.  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  She is being followed at [facility].  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  But pulmonary function test has been stable all the way through 2022.  During this time she was maintained on CellCept through [place]..  Then in early 2023 developed shingles around the left ear and the neck.  Husband did show photographs of this.  During this time CellCept was making the shingles record and poorly healing.  Therefore CellCept had to be stopped.  She was pretty much off CellCept through the course of 2023.  During this time as of October 2023 her pulmonary function decline compared to 2022 but she was actually unaware of this.  Then abruptly in January 2024 she ended up with hypertension and prior to that was having worsening pedal edema].  She was admitted for renal crisis at [facility].  Her baseline creatinine was 1.1 mg percent [November 2023] but on 08/09/2022 it was 1.8 mg percent.  The following day on 08/10/2022 she was transferred to [facility] because of complex medical condition.  She was  started on CellCept there she spent 6 days then then discharge.  During this time because of concern of shingles recommend she is being maintained on daily acyclovir.  She is also on amlodipine and lisinopril.  She is continuing this.  She is also on diuresis.  She is continuing this.  She says she is diuresed well.  Currently blood pressures have improved significantly between 0000000 - 0000000 systolic.  Of note while at [facility] she did have CT scan of the chest [I personally visualized today] she actually has ILD although radiologist does not mention this.  She has pericardial effusion both on the echo at [facility] and the CT scan.  This was absent even as late as October 2023.  She followed up with rheumatology and pulmonary at [facility] both Dr. Jola Schmidt [rheumatology] and also Dr. Lance Morin in pulmonary.  Their notes are documented below but the consensus that she actually has ILD and is actually progressive even before the renal crisis.  The pulmonary function test after renal crisis shows further progression although this could be clouded by all the volume overload issues.  The supporting CellCept.  It appears nintedanib was not discussed with the patient.  She does not recollect this.  They are concerned about her dysphagia and I recommended endoscopy but given the pericardial effusion want to pulmonary hypertension/cardiac issues stabilized before undergoing any endoscopy there also recommended weekly creatinine.  Her latest creatinine yesterday was 1.9 mg percent.  [Peak creatinine 2.03 mg percent 08/22/2022).  She is trying to get an appointment with nephrology.  I personally spoke to Dr. Broadus John: None ordered today over the phone.  Of note she has never had a right heart catheterization. It appears she has not seen [facility] pulmonary/ILD  SYMPTOM SCALE - ILD 09/05/2022  Current weight   O2 use ra  Shortness of Breath 0 -> 5 scale with 5 being worst (score 6 If unable to do)  At rest 1  Simple tasks -  showers, clothes change, eating, shaving 1  Household (dishes, doing bed, laundry) 2  Shopping x  Walking level at own pace 1  Walking up Stairs 2  Total (30-36) Dyspnea Score 7      Non-dyspnea symptoms (0-> 5 scale) 09/05/2022  How bad is your cough? 3  How bad is your fatigue 3  How bad is nausea 3  How bad is vomiting?  0  How bad is diarrhea? 0  How bad is anxiety? 0  How bad is depression 0  Any chronic pain - if so where and how bad 1     Past Medical History :   In terms of her breast cancer, she had a left sided lumpectomy and radiation. She is being followed for a possible lesion on her right breast, but recent testing has been negative (other than biomarkers)  SSc disease characteristics from [facility] Cutaneous subtype: Diffuse Autoantibody profile: RNA pol III Pulmonary involvement: ILD Cardiac involvement: Pericardial effusion GI involvement: Dysphagia Skin/vascular: Raynaud's, Telangiectases, and Sclerodactyly  She did get radiation for breast cancer.   ROS:  -Positive for scleroderma with Raynaud's.  It appears the skin score is worsened -Immunosuppressed status - Shingles in 2023 - Dysphagia - Fatigue - Dry eyes  FAMILY HISTORY of LUNG DISEASE:  -Negative  PERSONAL EXPOSURE HISTORY:  -Denies any smoking or marijuana or vaping or intravenous drug use.  HOME  EXPOSURE and HOBBY DETAILS :  [place]  [place] is lived there for 17 years.  The age of the home is 17 years.  Detail organic and inorganic antigen history is negative.  She is retired.  She is involved with the [employer].  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  Detail organic and inorganic antigen exposure history is negative.  PULMONARY TOXICITY HISTORY (27 items):  X-radiation for breast cancer  INVESTIGATIONS: -GFR of 25.9 with an AST of 43 and a creatinine 1.9 mg percent  09/04/2022.    PFT data for ILD 12/23/2017 2.25/2020 03/07/2020 11/14/20 [place] 05/15/21 05/14/2022 at [facility]  08/27/22 at [facility]  In the context of volume overload and hospitalization  FVC 2.82 2.92 2.88 2.77 2.75 2.05 1.59  FVC %      73% 60%  Ratio      77 76  TLC   3.93  4.34 3.09 2.41  TLC %      62% 51%  DLCO 19.2 10.67 11.68 12.29 12.82 10.2 5.38  DLCO %      52% 29%      Simple office walk 185  feet x  3 laps goal with forehead probe 09/05/2022    O2 used ra   Number laps completed 3   Comments about pace pace   Resting Pulse Ox/HR 100% and 87/min   Final Pulse Ox/HR 97% and 107/min   Desaturated </= 88% no   Desaturated <= 3% points yes   Got Tachycardic >/= 90/min yes   Symptoms at end of test x   Miscellaneous comments x    HRCT 05/20/2017  - at [facility] Per report   1.  Mild lower lobe and peripheral predominant reticulations, minimal traction bronchiectasis, and ground glass opacities with no honeycombing. Findings representing probable UIP pattern as per Fleischner criteria. 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 2.  Interval development of small bilateral pleural effusions and a small pericardial effusion. 3.  Interval increase in anterior left lower lobe pleural thickening and subpleural reticulation suggestive of post radiation changes. Is only seen on prior examination. 4.  There is a nonspecific 2 mm nodule in the right upper lobe which is not seen on prior examination likely due to different technique. Attention on follow-up examination as per clinical protocol is recommended.  Electronically Signed by:  Richarda Blade, MD, [facility] Electronically Signed on:  05/20/2017 2:04 PM Procedure Note  Richarda Blade, MD - 05/20/2017 Formatting of this note might be different from the original. Chest CT  Indication: Interstitial lung disease, Scleroderma involving lung , M34.9 Systemic sclerosis,  unspecified (CMS-HCC).  Comparison: January 21, 2017   CT Chest data wo contrst Jan 2024 -personally visualized.  My personal interpretation is that there is ILD.  ILD is also been reported in the subsequent high-resolution CT chest at [facility]  Narrative & Impression  CLINICAL DATA:  Shortness of breath.  History of breast cancer.   EXAM: CT CHEST WITHOUT CONTRAST   TECHNIQUE: Multidetector CT imaging of the chest was performed following the standard protocol without IV contrast.   RADIATION DOSE REDUCTION: This exam was performed according to the departmental dose-optimization program which includes automated exposure control, adjustment of the mA and/or kV according to patient size and/or use of iterative reconstruction technique.   COMPARISON:  August 09, 2022.  February 28, 2022.   FINDINGS: Cardiovascular: No evidence of thoracic aortic aneurysm. Mild cardiomegaly is noted. Mild to moderate pericardial effusion is noted.   Mediastinum/Nodes: No enlarged mediastinal or axillary lymph nodes. Thyroid gland, trachea, and esophagus demonstrate no significant findings.   Lungs/Pleura: Small bilateral pleural effusions are noted. Minimal bibasilar pulmonary edema or subsegmental atelectasis is noted. No pneumothorax is noted.   Upper Abdomen: No acute abnormality.   Musculoskeletal: Stable old left rib fractures are noted. No acute osseous abnormality is noted.   IMPRESSION: Mild to moderate size pericardial effusion is now noted.   Small bilateral pleural effusions are noted with associated minimal bibasilar pulmonary edema or subsegmental atelectasis.     Electronically Signed   By: Marijo Conception M.D.   On: 08/10/2022 14:47      CT [facility] -this was done at [facility].  I personally could not visualize this because the upload system would not work.  IMPRESSION: 1.  Interstitial lung disease/fibrosis. Nonspecific patchy bilateral groundglass  opacities. 2.  Moderate pericardial effusion. 3.  Small left pleural effusion, trace right pleural effusion. 4.  Mediastinal adenopathy.  Images and interpretation personally reviewed by: Wille Glaser, MD Exam End: 08/26/22 16:43   Specimen Collected: 08/26/22  16:45 Last Resulted: 08/26/22 16:49  Received From: [facility]    ECHO 08/10/22 (no effusion in Oct 2023)   Sonographer Comments: Image acquisition challenging due to breast  implants.  IMPRESSIONS     1. Left ventricular ejection fraction, by estimation, is 60 to 65%. The  left ventricle has normal function. The left ventricle has no regional  wall motion abnormalities. There is mild left ventricular hypertrophy of  the basal-septal segment. Left  ventricular diastolic parameters are consistent with Grade I diastolic  dysfunction (impaired relaxation).   2. Right ventricular systolic function is normal. The right ventricular  size is normal.   3. Moderate pericardial effusion. There is no evidence of cardiac  tamponade.   4. The mitral valve is normal in structure. Trivial mitral valve  regurgitation. No evidence of mitral stenosis.   5. The aortic valve is tricuspid. Aortic valve regurgitation is trivial.  No aortic stenosis is present.   6. The inferior vena cava is normal in size with greater than 50%  respiratory variability, suggesting right atrial pressure of 3 mmHg.   Comparison(s): No prior Echocardiogram.     has a past medical history of Anemia, Breastr cancer, IDC, Left UOQ, clinical stage II Receptor +, Her 2 - (08/29/2011 DX), Colon polyp, Contracture of hand, Depression, Esophageal stricture, GERD (gastroesophageal reflux disease), Hiatal hernia, History of external beam radiation therapy (11-25-2011 to 01-08-2012), Hyperlipidemia, Hypothyroidism, Internal hemorrhoids, Osteoarthritis, Osteopenia, Positive ANA (antinuclear antibody), Rash, Raynaud's syndrome, Scleroderma involving lung ([place])  (pulmologist-  dr h. Ruthann Cancer at [facility]), Skin thickening, Systemic sclerosis ([place]), and UTI (urinary tract infection).   reports that she has never smoked. She has never used smokeless tobacco.  Past Surgical History:  Procedure Laterality Date   BIOPSY  01/29/2018   Procedure: BIOPSY;  Surgeon: Jerene Bears, MD;  Location: [facility];  Service: Gastroenterology;;   Lillard Anes  2000   Left   COLONOSCOPY WITH PROPOFOL N/A 01/29/2018   Procedure: COLONOSCOPY WITH PROPOFOL;  Surgeon: Jerene Bears, MD;  Location: [facility];  Service: Gastroenterology;  Laterality: N/A;   DILATATION & CURETTAGE/HYSTEROSCOPY WITH MYOSURE N/A 08/04/2017   Procedure: DILATATION & CURETTAGE/HYSTEROSCOPY WITH MYOSURE;  Surgeon: Megan Salon, MD;  Location: [facility];  Service: Gynecology;  Laterality: N/A;   ECTOPIC PREGNANCY SURGERY  1986-88   s/p unilateral salpingectomy   ESOPHAGOGASTRODUODENOSCOPY (EGD) WITH PROPOFOL N/A 01/29/2018   Procedure: ESOPHAGOGASTRODUODENOSCOPY (EGD) WITH PROPOFOL;  Surgeon: Jerene Bears, MD;  Location: [facility];  Service: Gastroenterology;  Laterality: N/A;   PARTIAL MASTECTOMY WITH AXILLARY SENTINEL LYMPH NODE BIOPSY Left 11-04-2011   dr Margot Chimes  [facility]   TRANSTHORACIC ECHOCARDIOGRAM  06-04-2017    [facility]   moderate LVH, ef>55%/  trivial AR and TR/ mild MR and PR/  trivial pericardial effusion   WISDOM TOOTH EXTRACTION      Allergies  Allergen Reactions   Betadine [Povidone Iodine] Rash   Contrast Media [Iodinated Contrast Media] Rash and Other (See Comments)    "per pt: recently had CT 08/ 2018 even through given pre-medication, IVP still caused rash"   Epinephrine Other (See Comments)    Severe headaches    Iodine Rash and Other (See Comments)   Oxycodone Anxiety   Gabapentin Other (See Comments)    Dizziness   Lidocaine Rash   Nickel Rash and Other (See Comments)    Rash and blisters   Other Rash and Other (See Comments)    Hospital gown  Penicillins Rash and Other (See Comments)    Rash and blisters Has patient had a PCN reaction causing immediate rash, facial/tongue/throat swelling, SOB or lightheadedness with hypotension: Yes Has patient had a PCN reaction causing severe rash involving mucus membranes or skin necrosis: No Has patient had a PCN reaction that required hospitalization: No Has patient had a PCN reaction occurring within the last 10 years: No If all of the above answers are "NO", then may proceed with Cephalosporin use.    Sulfa Antibiotics Rash    Immunization History  Administered Date(s) Administered   Tdap 04/12/2008, 12/28/2018   Zoster, Live 08/16/2011    Family History  Problem Relation Age of Onset   Heart disease Mother    Heart failure Mother    Heart disease Father    Ovarian cancer Maternal Aunt    Heart disease Paternal Uncle        multiple   Autoimmune disease Sister    Arrhythmia Sister    Anemia Sister    CAD Brother    Colon cancer Neg Hx    Stomach cancer Neg Hx    Esophageal cancer Neg Hx    Rectal cancer Neg Hx      Current Outpatient Medications:    acetaminophen (TYLENOL) 325 MG tablet, Take 2 tablets (650 mg total) by mouth every 6 (six) hours as needed for mild pain or headache., Disp: , Rfl:    amLODipine (NORVASC) 2.5 MG tablet, Take 1 tablet (2.5 mg total) by mouth 2 (two) times daily., Disp: , Rfl:    captopril (CAPOTEN) 12.5 MG tablet, Take 0.5 tablets (6.25 mg total) by mouth every 8 (eight) hours., Disp: , Rfl:    Cholecalciferol 1.25 MG (50000 UT) capsule, Take 1 capsule by mouth once a week., Disp: , Rfl:    furosemide (LASIX) 10 MG/ML injection, Inject 4 mLs (40 mg total) into the vein every 12 (twelve) hours., Disp: 4 mL, Rfl: 0   levofloxacin (LEVAQUIN) 750 MG/150ML SOLN, Inject 150 mLs (750 mg total) into the vein every other day., Disp: 1000 mL, Rfl:    mycophenolate (CELLCEPT) 500 MG tablet, Take 1,000 mg by mouth in the morning, at noon, in the evening,  and at bedtime., Disp: , Rfl:    pantoprazole (PROTONIX) 40 MG tablet, Take 1 tablet (40 mg total) by mouth 2 (two) times daily., Disp: 180 tablet, Rfl: 3   SYNTHROID 100 MCG tablet, Take 1 tablet (100 mcg total) by mouth daily before breakfast., Disp: 30 tablet, Rfl: 1   valACYclovir (VALTREX) 500 MG tablet, Take 1 tablet by mouth daily., Disp: , Rfl:    hydrALAZINE (APRESOLINE) 20 MG/ML injection, Inject 0.5 mLs (10 mg total) into the vein every 4 (four) hours as needed (systolic greater than 0000000 and/or diastolic greater than A999333). (Patient not taking: Reported on 09/05/2022), Disp: 1 mL, Rfl:    ondansetron (ZOFRAN) 4 MG/2ML SOLN injection, Inject 2 mLs (4 mg total) into the vein every 6 (six) hours as needed for nausea or vomiting. (Patient not taking: Reported on 09/05/2022), Disp: 2 mL, Rfl: 0      Objective:   Vitals:   09/05/22 1504  BP: (!) 102/56  Pulse: 88  Temp: 98.1 F (36.7 C)  TempSrc: Oral  SpO2: 96%  Weight: 153 lb 6.4 oz (69.6 kg)  Height: 5' 4.5" (1.638 m)    Estimated body mass index is 25.92 kg/m as calculated from the following:   Height as of this encounter: 5' 4.5" (  1.638 m).   Weight as of this encounter: 153 lb 6.4 oz (69.6 kg).  @WEIGHTCHANGE$ @  [employer]   09/05/22 1504  Weight: 153 lb 6.4 oz (69.6 kg)     Physical Exam General: No distress. Looks stable Neuro: Alert and Oriented x 3. GCS 15. Speech normal Psych: Pleasant Resp:  Barrel Chest - no.  Wheeze - no, Crackles - YES BASES, No overt respiratory distress CVS: Normal heart sounds. 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 - Deformities of RA - NO - Scleroderma  - YES - Malar Rash -  NO  HEENT: Normal upper airway. PEERL +. No post nasal drip        Assessment:       ICD-10-CM   1. 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 ILD (interstitial lung disease) ([place])  J84.9     3. 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 Pericardial effusion  I31.39     5. 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 Other secondary hypertension  I15.8     7. Encounter for medication refill  Z76.0      She has diffuse systemic sclerosis.  The disease burden is high.  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  When she was in the office I did not have opportunity to review all her prior PFTs and CT scan reports from [facility] but have come to this conclusion after she left the office.  Tried to communicated this with her over the phone today after she left the office but LMTCB.  I support CellCept.  I do think nintedanib is indicated particular of the disease burden is greater than 10%.  However her low GFR could be problematic.  I will reach out to radiology.  I will also speak to Dr. Lance Morin at [facility].  In terms of her pericardial effusion: I think a right heart cath is indicated to rule out pulmonary hypertension with or without tamponade.  She needs hypertension management and diuresis management.  Therefore reached out to Dr. Loralie Champagne and Dr. Glori Bickers in the heart failure clinic we have good experience and autoimmune patients.  Renal: I was personally spoken to Dr. Marval Regal to get an appointment with her soon  Dysphagia: She sees Dr. Hilarie Fredrickson she needs an endoscopy.  Is very reassuring she not desaturating with simple exertion and I think she can handle anesthesia but to be on the safe side I agree with [facility] to get cardiology  evaluation first.  Miscellaneous: I thought the left diaphragm is elevated on her CT scan.  Will get a sniff test.  Medication refill: She is requesting refill on 4 of her medications because she does not have established subspecialty care here in [place].  I am going to do this for her.  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 Progessed in 2023 when off cellcept. Still o2 levels holding up with current walk test  Plan  - cellcept via [facility] for SSc renal crisis and general SSc treatment is beneficial  for the lungs   - so continue this - will need to consider starting you on Ofev within next 8 weeks or so   - will discuss extent of lung involvement with our radiologist + also Dr Aurora Mask at [facility]  - but given progression low threshold to start (dose adjsut for reduced renal function)(  - CMA will request upload a few of high-resolution CT chest Feb 2024 [facility] Scan  -Do spirometry and DLCO in the next 6 weeks  Elevated diaphragm  -You might have elevation of the left hemidiaphragm and this could be contributing to her shortness of breath  Plan - Do sniff test  Pericardial effusion  -We need to make sure there is no negative effects of the pericardial effusion on your heart.  In addition you need to rule out pulmonary hypertension from scleroderma.  You also need guidance for your blood pressure control and your diuresis  Plan - Recommend referral to Dr. Loralie Champagne or  Dr. Glori Bickers with the heart failure service  -I personally sent a message to them  Acute scleroderma renal crisis [facility]) -January 2024 in the setting of stopping CellCept because of shingles  -Creatinine yesterday was greater than 2 mg percent  Plan - d/w  Dr. Wille GlaserMarval Regal  over  phone - he will try to get you in soon  Other secondary hypertension Encounter for medication refill  -At your request we will refill your cholecalciferol, valacyclovir amlodipine and lisinopril.  -30-day supply with 3 refills but after that you will get this filled by the primary prescriber -Because your blood pressure is improved you can take amlodipine at half dose [5 mg] per day and monitor your blood pressure  Preoperative respiratory exam prior to endoscopy  -Agree with [facility] recommendation to hold off on endoscopy due to presence of pericardial effusion  Plan - We will be able to get clearance for the endoscopy after your evaluation by cardiology services  Follow-up - 6 weeks follow-up with Dr. Chase Caller but after spirometry and DLCO  -30-minute visit [symptom score and walking desaturation test at follow-up.   ( Level 05 visit: New 60-74 min   in  visit type: on-site physical face to visit  in total care time and counseling or/and coordination of care by this undersigned MD - Dr Brand Males. This includes one or more of the following on this same day 09/05/2022: pre-charting, chart review, note writing, documentation discussion of test results, diagnostic or treatment recommendations, prognosis, risks and benefits of management options, instructions, education, compliance or risk-factor reduction. It excludes time spent by the [place] or office staff in the care of the patient. Actual time 110 min)  SIGNATURE    Dr. Brand Males, M.D., F.C.C.P,  Pulmonary and Critical Care Medicine Staff Physician, [place] Director - Interstitial Lung Disease  Program  Pulmonary [place] at [place], [place], [address]  Pager: [phone], If no answer or between  15:00h - 7:00h: call [phone] Telephone: [phone]  6:53 PM 09/05/2022

## 2022-09-06 ENCOUNTER — Other Ambulatory Visit: Payer: Self-pay | Admitting: Hematology & Oncology

## 2022-09-06 ENCOUNTER — Telehealth: Payer: Self-pay | Admitting: *Deleted

## 2022-09-06 DIAGNOSIS — M349 Systemic sclerosis, unspecified: Secondary | ICD-10-CM

## 2022-09-06 NOTE — Telephone Encounter (Signed)
Attempted to call pt but unable to reach. Left message to return call.  

## 2022-09-06 NOTE — Telephone Encounter (Signed)
Faxed referral to [facility] - Dr. Marval Regal [phone]

## 2022-09-06 NOTE — Telephone Encounter (Addendum)
CD received and HRCT 08/26/22 at [facility] uploaded.  HRCT image available for viewing in PACS. CD placed in Dr. Golden Pop A pod box.

## 2022-09-06 NOTE — Telephone Encounter (Signed)
CD placed in Dr. Golden Pop A pod box.

## 2022-09-07 LAB — CARDIO IQ® NT PROBNP: NT PROBNP: 284 pg/mL — ABNORMAL HIGH (ref <125)

## 2022-09-09 ENCOUNTER — Encounter: Payer: Self-pay | Admitting: Family Medicine

## 2022-09-09 ENCOUNTER — Other Ambulatory Visit: Payer: Self-pay | Admitting: Family

## 2022-09-09 ENCOUNTER — Ambulatory Visit: Payer: Medicare Other

## 2022-09-09 DIAGNOSIS — D509 Iron deficiency anemia, unspecified: Secondary | ICD-10-CM | POA: Insufficient documentation

## 2022-09-09 DIAGNOSIS — E039 Hypothyroidism, unspecified: Secondary | ICD-10-CM

## 2022-09-09 NOTE — Telephone Encounter (Signed)
Jennifer   Villa [name] autoimmune is active. Please get patient in soon to see [name] or [name]  Thanks    SIGNATURE    Dr. Brand Males, M.D., F.C.C.P,  Pulmonary and Critical Care Medicine Staff Physician, [place] Director - Interstitial Lung Disease  Program  Medical Director - [place] [facility] Pul[facility] [place] at [place], [place], [address]   Pager: [phone], If no answer  -[place] or [place] [phone] Telephone (clinical office): [phone] Telephone (research): [phone]  10:28 PM 09/09/2022

## 2022-09-11 ENCOUNTER — Other Ambulatory Visit: Payer: Self-pay | Admitting: Family

## 2022-09-11 NOTE — Addendum Note (Signed)
Addended by: Darreld Mclean on: 09/11/2022 08:36 AM   Modules accepted: Orders

## 2022-09-12 ENCOUNTER — Other Ambulatory Visit (INDEPENDENT_AMBULATORY_CARE_PROVIDER_SITE_OTHER): Payer: Medicare Other

## 2022-09-12 ENCOUNTER — Telehealth: Payer: Self-pay | Admitting: Internal Medicine

## 2022-09-12 DIAGNOSIS — M349 Systemic sclerosis, unspecified: Secondary | ICD-10-CM | POA: Diagnosis not present

## 2022-09-12 DIAGNOSIS — E039 Hypothyroidism, unspecified: Secondary | ICD-10-CM

## 2022-09-12 LAB — COMPREHENSIVE METABOLIC PANEL
ALT: 18 U/L (ref 0–35)
AST: 45 U/L — ABNORMAL HIGH (ref 0–37)
Albumin: 4.1 g/dL (ref 3.5–5.2)
Alkaline Phosphatase: 58 U/L (ref 39–117)
BUN: 17 mg/dL (ref 6–23)
CO2: 24 mEq/L (ref 19–32)
Calcium: 9.8 mg/dL (ref 8.4–10.5)
Chloride: 102 mEq/L (ref 96–112)
Creatinine, Ser: 1.35 mg/dL — ABNORMAL HIGH (ref 0.40–1.20)
GFR: 39.02 mL/min — ABNORMAL LOW (ref >60.00)
Glucose, Bld: 75 mg/dL (ref 70–99)
Potassium: 4.2 mEq/L (ref 3.5–5.1)
Sodium: 137 mEq/L (ref 135–145)
Total Bilirubin: 0.5 mg/dL (ref 0.2–1.2)
Total Protein: 6.8 g/dL (ref 6.0–8.3)

## 2022-09-12 LAB — CBC
HCT: 35.5 % — ABNORMAL LOW (ref 36.0–46.0)
Hemoglobin: 11.9 g/dL — ABNORMAL LOW (ref 12.0–15.0)
MCHC: 33.4 g/dL (ref 30.0–36.0)
MCV: 90 fl (ref 78.0–100.0)
Platelets: 310 10*3/uL (ref 150.0–400.0)
RBC: 3.94 Mil/uL (ref 3.87–5.11)
RDW: 18.5 % — ABNORMAL HIGH (ref 11.5–15.5)
WBC: 8.8 10*3/uL (ref 4.0–10.5)

## 2022-09-12 LAB — TSH: TSH: 10.72 u[IU]/mL — ABNORMAL HIGH (ref 0.35–5.50)

## 2022-09-12 LAB — CK: Total CK: 551 U/L — ABNORMAL HIGH (ref 7–177)

## 2022-09-12 NOTE — Telephone Encounter (Signed)
Pharm: [facility] @ [place] visit, 2/22, Dr. Chase Caller said he'd fill some RX's for her. Nothing has been rec'd yet, she said.   Here is the AVS Notes:  -At your request we will refill your cholecalciferol, valacyclovir amlodipine and lisinopril.             -30-day supply with 3 refills but after that you will get this filled by the primary prescriber  Pls call PT to Advise: [phone]

## 2022-09-13 ENCOUNTER — Encounter: Payer: Self-pay | Admitting: Family Medicine

## 2022-09-13 DIAGNOSIS — E039 Hypothyroidism, unspecified: Secondary | ICD-10-CM

## 2022-09-13 DIAGNOSIS — M349 Systemic sclerosis, unspecified: Secondary | ICD-10-CM | POA: Diagnosis not present

## 2022-09-13 DIAGNOSIS — J849 Interstitial pulmonary disease, unspecified: Secondary | ICD-10-CM | POA: Diagnosis not present

## 2022-09-13 DIAGNOSIS — N280 Ischemia and infarction of kidney: Secondary | ICD-10-CM | POA: Diagnosis not present

## 2022-09-13 DIAGNOSIS — I129 Hypertensive chronic kidney disease with stage 1 through stage 4 chronic kidney disease, or unspecified chronic kidney disease: Secondary | ICD-10-CM | POA: Diagnosis not present

## 2022-09-13 DIAGNOSIS — N1831 Chronic kidney disease, stage 3a: Secondary | ICD-10-CM | POA: Diagnosis not present

## 2022-09-13 MED ORDER — AMLODIPINE BESYLATE 2.5 MG PO TABS: 2.5000 mg | ORAL_TABLET | Freq: Two times a day (BID) | ORAL | 3 refills | Status: DC

## 2022-09-13 MED ORDER — VALACYCLOVIR HCL 500 MG PO TABS: 500.0000 mg | ORAL_TABLET | Freq: Every day | ORAL | 3 refills | Status: DC

## 2022-09-13 MED ORDER — CHOLECALCIFEROL 1.25 MG (50000 UT) PO CAPS: 50000.0000 [IU] | ORAL_CAPSULE | ORAL | 2 refills | Status: AC

## 2022-09-13 MED ORDER — LEVOTHYROXINE SODIUM 25 MCG PO TABS: 25.0000 ug | ORAL_TABLET | Freq: Every day | ORAL | 3 refills | Status: DC

## 2022-09-13 MED ORDER — LISINOPRIL 40 MG PO TABS: 40.0000 mg | ORAL_TABLET | Freq: Every day | ORAL | 3 refills | Status: DC

## 2022-09-13 NOTE — Telephone Encounter (Signed)
Patient is calling again about her refills.  She stated that she will be all out of her meds soon and the pharmacy said they do not have a script.  Please advise and send meds to pharmacy asap.  CB# [phone]

## 2022-09-13 NOTE — Telephone Encounter (Signed)
Called and spoke with patient. I advised her that I would go ahead and send in the refills mentioned on her AVS. She verbalized understanding.   Nothing further needed at time of call.

## 2022-09-14 NOTE — Progress Notes (Unsigned)
[facility] [address], [place], [place] [address] [phone]  Date:  09/19/2022   Name:  [name]   DOB:  [birth year]   MRN:  [id number]  PCP:  Jennifer Mclean, MD    Chief Complaint: No chief complaint on file.   History of Present Illness:  [name] is a 72 y.o. very pleasant female patient who presents with the following:  Patient seen today for follow-up.  I last saw her about 6 months ago to establish care.  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-she has managed both locally and at [facility] and [place] Dr Bing Quarry at [facility] is her local rheumatologist Another Dr Brigitte Pulse- Royetta Car at [facility] is her point person at [facility]   Dr [name] is her local rheumatologist  Dr [name] is following her breast imaging here- she is doing periodic MRI as they are trying to avoid further radiation to her chest   Unfortunately [name] had a setback earlier this year-she presented to [facility] on 1/26 with an acute scleroderma renal crisis; she was then transferred to [facility] and stayed there until February 1 at which point she was discharged to home: Admission Diagnoses: Scleroderma renal crises  Discharge Diagnoses: Scleroderma Renal Crisis (resolved) Systemic sclerosis Primary Diagnosis: Admitted for scleroderma renal crisis  Changes Made: -BP well controlled with lisinopril 40 and amlodipine 10 -Lasix 40 daily for edema and BP -started MMF 500 BID with plan to increase -treated for CAP, possibly aspiration etiology -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 -Synthroid recently increased by outside provider, current dose 128mg Anticipatory Guidance for Outpatient Provider: -new sCr baseline of 2 -avoid steroids, nephrotoxins, watch potassium -f/u with rheum and nephro arranged, also outpatient GI -high suspicion for ILD Recommended Follow-up Studies: -RFP, HFP, CBC, LDH,  haptoglobin within 1 week -repeat TFTs in 4 weeks -rheumatology considering repeat cross-sectional imaging to evaluate for malignancy -recommend CT chest ILD protocol and full PFTs in 4-6 weeks when pneumonia has more fully resolved  Hospital Course by Problem:  In brief, started on high dose captopril, added amlodipine, transitioned to lisinopril and BP well controlled. Lasix added for edema and BP. Also treated for CAP with ceftriaxone/azithro. 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 Seen by rheum and nephro. Started MMF 500 BID and has f/u with multiple specialists.   # Scleroderma renal crisis, resolved # Systemic sclerosis # CKD Presented to OSH with uncontrolled hypertension, AKI, thrombocytopenia, and evidence of hemolysis c/f scleroderma renal crisis especially iso progressive sclerodactyly and worsening dyspnea. Rheum and nephrology consulted, started on high-dose captopril and transition to lisinopril 40. Uptitrated amlodipine to 10 mg daily. Added Lasix 40 daily for blood pressure and edema. Also restarted MMF at dose of 500 twice daily with plan to increase over the next few weeks. New baseline creatinine 2.0. Hemoglobin 10 and platelets 180s. Plan to recheck RFP and CBC and LDH and haptoglobin 1 week. Has nephrology and rheumatology follow-up at [facility]  # Community-acquired pneumonia # Suspected ILD Worsening cough and dyspnea, OSH CT w LLL opacity, CXR here with multifocal infiltrates. Imaging appearance consistent with acute process on top of underlying fibrotic changes. Treated with 5 days of ceftriaxone and 3 days of azithromycin. He RVP negative, no organism isolated. On discharge afebrile, no white count, oxygenating well on room air. We recommend CT chest in 4 to 6  weeks after resolution of her pneumonia. Has pulmonology follow-up at [facility].  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 Had an outpatient EGD planned for esophageal dilation. She was seen by speech and had a video swallow study with no aspiration, but there was concern for cricopharyngeal bar. She was discharged on a normal diet with ENT follow-up for cricopharyngeal bar and also GI scheduled for an outpatient dilation.  # Hypothyroid TSH 40 with free T4 normal consistent with subclinical hypothyroidism. Synthroid recently increased to 100 mcg daily by outside provider. Recheck in 4 weeks.  # H/o shingles Valacyclovir prophylaxis while on mycophenolate, renally dosed.   She was seen by pulmonology, Dr. Chase Caller on February 22: She has diffuse systemic sclerosis.  The disease burden is high.  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  When she was in the office I did not have opportunity to review all her prior PFTs and CT scan reports from [facility] but have come to this conclusion after she left the office.  Tried to communicated this with her over the phone today after she left the office but LMTCB.  I support CellCept.  I do think nintedanib is indicated particular of the disease burden is greater than 10%.  However her low GFR could be problematic.  I will reach out to radiology.  I will also speak to Dr. Lance Morin at [facility].   In terms of her pericardial effusion: I think a right heart cath is indicated to rule out pulmonary hypertension with or without tamponade.  She needs hypertension management and diuresis management.  Therefore reached out to Dr. Loralie Champagne and Dr. Glori Bickers in the heart failure clinic we have good experience and autoimmune patients.   Renal: I was personally spoken to Dr. Marval Regal to get an appointment with her soon   Dysphagia: She sees Dr. Hilarie Fredrickson she needs an endoscopy.  Is very reassuring she not desaturating with  simple exertion and I think she can handle anesthesia but to be on the safe side I agree with [facility] to get cardiology evaluation first.   Miscellaneous: I thought the left diaphragm is elevated on her CT scan.  Will get a sniff test.   Medication refill: She is requesting refill on 4 of her medications because she does not have established subspecialty care here in [place].  I am going to do this for her.   Overall: She might need IVIG - through her rheum Dr Manuella Ghazi at [facility]    She also was seen by her rheumatologist at [facility] in [place] on February 14- Dr Ethelle Lyon: Impression: Ms. Harlan is a 72 y.o. 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 She had signs of mild interstitial lung disease on the CAT scan report from 2018. She was started on CellCept at that point and had a significant improvement in the forced vital capacity. Overall, she feels like her breathing has been worse since July 2023. She recently was discharged from [facility] with scleroderma renal crisis. Her creatinine continues to improve, but she still exhibits signs of volume overload both on exam and imaging with a moderate pericardial effusion and small bilateral pleural effusions. Her breathing symptoms have been worse in the context and her pulmonary function tests  this week are challenging to interpret with her acute recent hospitalization. However, she did have PFTs done at [facility] in October, which was prior to her hospitalization that showed a pattern of worsening in terms of her FVC and DLCO. So it clearly appears that she had progressive ILD even before her scleroderma renal crisis. She started back on CellCept 1000 mg twice per day. Dr. Rae Mar) Manuella Ghazi would like her to stay on this dose rather than increase it to 1500 mg twice per day because of her kidney function.  Plan: -Continue CellCept. Agree with the current dosing, but as her creatinine  improves I would prefer to get her up to 1500 mg twice per day -Repeat PFTs in 3 months - I note that her CK and inflammatory markers are elevated. This is likely in the context of her renal crisis. I will talk to Dr. Manuella Ghazi about IVIG, although the amount of volume would not be well-tolerated at this point. -Continue furosemide through the weekend. Her rheumatologist at [place] is working on getting her a sooner appointment with a nephrologist, who will be directing her diuretic therapy. For now I would suggest getting weekly creatinine values until she can see nephrology  She was seen by [facility], Dr. Jeanella Anton   ------------------------------------------------------ Since her hospital discharge we have been getting periodic labs and monitoring her creatinine, CK, thyroid.  Recent TSH was elevated and we increased her levothyroxine from 100 to 125 mcg   Patient Active Problem List   Diagnosis Date Noted   IDA (iron deficiency anemia) 09/09/2022   Pericardial effusion 08/10/2022   Hypokalemia 08/09/2022   Hypocalcemia 08/09/2022   Hypothyroidism 08/09/2022   AKI (acute kidney injury) ([place]) 08/09/2022   Possible community acquired pneumonia vs CHF 08/09/2022   Acute scleroderma renal crisis ([place]) 08/09/2022   Dysphagia 08/06/2022   Scleroderma ([place]) 08/06/2022   Sjogren's syndrome ([place]) 02/14/2021   History of UTI 02/05/2021   Hyperlipidemia    History of colonic polyps    Anemia    Acute esophagitis    Hypertension 09/09/2017   High risk medication use 05/08/2017   Raynaud's disease without gangrene 05/08/2017   Diffuse systemic sclerosis ([place]) 05/08/2017   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) 06/21/2013   Dense breasts  12/14/2012   Osteoarthritis     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, ER/PR+, HER2 negative -- 11-04-2011  left partial mastectomy w/ sln dissection-- completed radiation 01-08-2012-- started tamoxifen 07/ 2013-- per lov note 11/ 2018 no recurrence   Colon polyp    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   Depression    Esophageal stricture    GERD (gastroesophageal reflux disease)    Hiatal hernia    History of external beam radiation therapy 11-25-2011 to 01-08-2012   left breast 45 Gy at 1.8 per fraction x25 fractions, boost 16 Gy at 2 per fraction x8 fractions   Hyperlipidemia    Hypothyroidism    Internal hemorrhoids    Osteoarthritis    Osteopenia    Positive ANA (antinuclear  antibody)    Rash    arms and legs caused by skin scleroderma   Raynaud's syndrome    Scleroderma involving lung ([place]) pulmologist-  dr h. Ruthann Cancer at [facility]   systemic sclerosis , diffuse/   rheumotologist:  dr Manuella Ghazi at [facility]---  lov 07-22-2017 (as of 07-25-2017 note not available to read)---  per pt has appt. w/ specialist at [facility]   Skin thickening    hands, arms, legs  caused by scleroderma   Systemic sclerosis ([place])    UTI (urinary tract infection)     Past Surgical History:  Procedure Laterality Date   BIOPSY  01/29/2018   Procedure: BIOPSY;  Surgeon: Jerene Bears, MD;  Location: [facility];  Service: Gastroenterology;;   Lillard Anes  2000   Left   COLONOSCOPY WITH PROPOFOL N/A 01/29/2018   Procedure: COLONOSCOPY WITH PROPOFOL;  Surgeon: Jerene Bears, MD;  Location: [facility];  Service: Gastroenterology;  Laterality: N/A;   DILATATION & CURETTAGE/HYSTEROSCOPY WITH MYOSURE N/A 08/04/2017   Procedure: DILATATION & CURETTAGE/HYSTEROSCOPY WITH MYOSURE;  Surgeon: Megan Salon, MD;  Location: [facility];  Service: Gynecology;  Laterality: N/A;   ECTOPIC PREGNANCY SURGERY  1986-88   s/p unilateral salpingectomy   ESOPHAGOGASTRODUODENOSCOPY (EGD) WITH PROPOFOL N/A 01/29/2018   Procedure: ESOPHAGOGASTRODUODENOSCOPY (EGD) WITH PROPOFOL;  Surgeon: Jerene Bears, MD;  Location: [facility];  Service: Gastroenterology;  Laterality: N/A;   PARTIAL MASTECTOMY WITH AXILLARY SENTINEL LYMPH NODE BIOPSY Left 11-04-2011   dr Margot Chimes  [facility]   TRANSTHORACIC ECHOCARDIOGRAM  06-04-2017    [facility]   moderate LVH, ef>55%/  trivial AR and TR/ mild MR and PR/  trivial pericardial effusion   WISDOM TOOTH EXTRACTION      Social History   Tobacco Use   Smoking status: Never   Smokeless tobacco: Never  Vaping Use   Vaping Use: Never used  Substance Use Topics   Alcohol use: Yes    Comment: Drinks one mixed drink nightly   Drug use: No    Family History  Problem Relation Age of Onset   Heart disease Mother    Heart failure Mother    Heart disease Father    Ovarian cancer Maternal Aunt    Heart disease Paternal Uncle        multiple   Autoimmune disease Sister    Arrhythmia Sister    Anemia Sister    CAD Brother    Colon cancer Neg Hx    Stomach cancer Neg Hx    Esophageal cancer Neg Hx    Rectal cancer Neg Hx     Allergies  Allergen Reactions   Betadine [Povidone Iodine] Rash   Contrast Media [Iodinated Contrast Media] Rash and Other (See Comments)    "per pt: recently had CT 08/ 2018 even through given pre-medication, IVP still caused rash"   Epinephrine Other (See Comments)    Severe headaches    Iodine Rash and Other (See Comments)   Oxycodone Anxiety   Gabapentin Other (See Comments)    Dizziness   Lidocaine Rash   Nickel Rash and Other (See Comments)    Rash and blisters   Other Rash and Other (See Comments)    Hospital gown   Penicillins Rash and Other (See Comments)    Rash and blisters Has patient had a PCN reaction causing immediate rash,  facial/tongue/throat swelling, SOB or lightheadedness with hypotension: Yes Has patient had a PCN reaction causing severe rash involving mucus membranes or skin  necrosis: No Has patient had a PCN reaction that required hospitalization: No Has patient had a PCN reaction occurring within the last 10 years: No If all of the above answers are "NO", then may proceed with Cephalosporin use.    Sulfa Antibiotics Rash    Medication list has been reviewed and updated.  Current Outpatient Medications on File Prior to Visit  Medication Sig Dispense Refill   acetaminophen (TYLENOL) 325 MG tablet Take 2 tablets (650 mg total) by mouth every 6 (six) hours as needed for mild pain or headache.     amLODipine (NORVASC) 2.5 MG tablet Take 1 tablet (2.5 mg total) by mouth 2 (two) times daily. 60 tablet 3   captopril (CAPOTEN) 12.5 MG tablet Take 0.5 tablets (6.25 mg total) by mouth every 8 (eight) hours.     Cholecalciferol 1.25 MG (50000 UT) capsule Take 1 capsule (50,000 Units total) by mouth once a week. 4 capsule 2   furosemide (LASIX) 10 MG/ML injection Inject 4 mLs (40 mg total) into the vein every 12 (twelve) hours. 4 mL 0   hydrALAZINE (APRESOLINE) 20 MG/ML injection Inject 0.5 mLs (10 mg total) into the vein every 4 (four) hours as needed (systolic greater than 0000000 and/or diastolic greater than [id number]). (Patient not taking: Reported on 09/05/2022) 1 mL    levofloxacin (LEVAQUIN) 750 MG/150ML SOLN Inject 150 mLs (750 mg total) into the vein every other day. 1000 mL    levothyroxine (SYNTHROID) 25 MCG tablet Take 1 tablet (25 mcg total) by mouth daily. 30 tablet 3   lisinopril (ZESTRIL) 40 MG tablet Take 1 tablet (40 mg total) by mouth daily. 30 tablet 3   mycophenolate (CELLCEPT) 500 MG tablet Take 1,000 mg by mouth in the morning, at noon, in the evening, and at bedtime.     ondansetron (ZOFRAN) 4 MG/2ML SOLN injection Inject 2 mLs (4 mg total) into the vein every 6 (six) hours as needed for nausea or  vomiting. (Patient not taking: Reported on 09/05/2022) 2 mL 0   pantoprazole (PROTONIX) 40 MG tablet Take 1 tablet (40 mg total) by mouth 2 (two) times daily. 180 tablet 3   SYNTHROID 100 MCG tablet Take 1 tablet (100 mcg total) by mouth daily before breakfast. 30 tablet 1   valACYclovir (VALTREX) 500 MG tablet Take 1 tablet (500 mg total) by mouth daily. 30 tablet 3   No current facility-administered medications on file prior to visit.    Review of Systems:  As per HPI- otherwise negative.   Physical Examination: There were no vitals filed for this visit. There were no vitals filed for this visit. There is no height or weight on file to calculate BMI. Ideal Body Weight:    GEN: no acute distress. HEENT: Atraumatic, Normocephalic.  Ears and Nose: No external deformity. CV: RRR, No M/G/R. No JVD. No thrill. No extra heart sounds. PULM: CTA B, no wheezes, crackles, rhonchi. No retractions. No resp. distress. No accessory muscle use. ABD: S, NT, ND, +BS. No rebound. No HSM. EXTR: No c/c/e PSYCH: Normally interactive. Conversant.    Assessment and Plan: ***  Signed Lamar Blinks, MD

## 2022-09-15 LAB — LAB REPORT - SCANNED
Albumin, Urine POC: 7.8
Creatinine, POC: 33.2 mg/dL
Microalb Creat Ratio: 23

## 2022-09-16 ENCOUNTER — Other Ambulatory Visit (HOSPITAL_COMMUNITY): Payer: Self-pay

## 2022-09-16 ENCOUNTER — Ambulatory Visit (HOSPITAL_COMMUNITY)
Admission: RE | Admit: 2022-09-16 | Discharge: 2022-09-16 | Disposition: A | Discharge status: Home / Self Care | Payer: Medicare Other | Source: Ambulatory Visit | Attending: Internal Medicine | Admitting: Internal Medicine

## 2022-09-16 ENCOUNTER — Ambulatory Visit: Payer: Medicare Other

## 2022-09-16 ENCOUNTER — Encounter: Payer: Self-pay | Admitting: Family Medicine

## 2022-09-16 ENCOUNTER — Encounter (HOSPITAL_COMMUNITY): Payer: Self-pay | Admitting: Internal Medicine

## 2022-09-16 VITALS — BP 122/68 | HR 89 | Wt 148.0 lb

## 2022-09-16 DIAGNOSIS — M349 Systemic sclerosis, unspecified: Secondary | ICD-10-CM | POA: Insufficient documentation

## 2022-09-16 DIAGNOSIS — Z923 Personal history of irradiation: Secondary | ICD-10-CM | POA: Diagnosis not present

## 2022-09-16 DIAGNOSIS — Z853 Personal history of malignant neoplasm of breast: Secondary | ICD-10-CM | POA: Diagnosis not present

## 2022-09-16 DIAGNOSIS — Z01810 Encounter for preprocedural cardiovascular examination: Secondary | ICD-10-CM | POA: Insufficient documentation

## 2022-09-16 DIAGNOSIS — J849 Interstitial pulmonary disease, unspecified: Secondary | ICD-10-CM | POA: Insufficient documentation

## 2022-09-16 DIAGNOSIS — Z79899 Other long term (current) drug therapy: Secondary | ICD-10-CM | POA: Diagnosis not present

## 2022-09-16 DIAGNOSIS — I3139 Other pericardial effusion (noninflammatory): Secondary | ICD-10-CM | POA: Diagnosis not present

## 2022-09-16 DIAGNOSIS — I272 Pulmonary hypertension, unspecified: Secondary | ICD-10-CM

## 2022-09-16 LAB — CARDIO IQ® NT PROBNP: NT PROBNP: 373 pg/mL — ABNORMAL HIGH (ref <125)

## 2022-09-16 NOTE — Progress Notes (Addendum)
ADVANCED HF CLINIC CONSULT NOTE  Referring Physician: Dr. Chase Caller Primary Care: Copland, Gay Filler, MD Primary Cardiologist: None Rheumatology: Dr. Manuella Ghazi at [facility] and Dr. Jola Schmidt @ [facility]  HPI:  [name] is a 72 y/o woman with scleroderma, ILD previous breast CA treated with XRT 2013 referred for further evaluation of possible PAH  She was first diagnosed with scleroderma in 2017 based on a high titer ANA and skin thickening. She has been followed previously in the [facility] and now followed by Dr . Chase Caller. She was started on CellCept in December 2018 with significant improvement in [facility] and diffusion capacity. Her CellCept was stopped in January 2023 due to prolonged episode of shingles.   She was recently admitted to [facility] for scleroderma renal crisis in late January 24. BP 225/117. By the time of discharge, she had improvement but not normalization of her creatinine (peak Scr 2.8. Now down to 1.35). Blood pressure was better controlled. Mycophenolate was added back due to a worsening of her skin disease (now on '1000mg'$  BID). Since she was discharged from the hospital, she feels like she is slowly improving but continues to have increased edema.  She was recently seen in Pulmonary at [facility] (2/24) who reviewed data and agreed with continuing Cellcept in setting of worsening lung function   PFT 05/14/2022 [facility]  FEV1/FVC 77  FVC 2.05-73%  FEV1 1.57-73%  TLC 3.09-62%  RV 1.04-49%  RV/TLC normal DLCO 10.20-52%  DLCO/Va   Consistent with mild restriction and moderate reduction in diffusion capacity. These are significantly worse than her PFTs from 2022, when she had an FVC of 2.75 and a DLCO of 12.62   Echo: 1.27.2024 during admit for renal crisis LVEF >60-65% G1 DD RV normal. IVC normal: Moderate pericardial effusion without signs of tamponade  Has had routine echos by never had a RHC  Here with her husband. Taking lasix daily. SBPs now range 120-130s with HR 80-90s  on lisinopril 40. Also taking lasix 20 daily. Scheduled for EGD on 3/14 due to worsening dysphagia. Says breathing doing pretty good. Can do all ADLs but goes slowly. Works with PT. Rides stationary bike a bit. Went to grocery yesterday and was ok. No syncope or presyncope. LE edema much improved.     Review of Systems: [y] = yes, '[ ]'$  = no   General: Weight gain '[ ]'$ ; Weight loss '[ ]'$ ; Anorexia '[ ]'$ ; Fatigue Blue.Reese ]; Fever '[ ]'$ ; Chills '[ ]'$ ; Weakness '[ ]'$   Cardiac: Chest pain/pressure '[ ]'$ ; Resting SOB '[ ]'$ ; Exertional SOB [ y]; Orthopnea '[ ]'$ ; Pedal Edema [ y]; Palpitations '[ ]'$ ; Syncope '[ ]'$ ; Presyncope '[ ]'$ ; Paroxysmal nocturnal dyspnea'[ ]'$   Pulmonary: Cough '[ ]'$ ; Wheezing'[ ]'$ ; Hemoptysis'[ ]'$ ; Sputum '[ ]'$ ; Snoring '[ ]'$   GI: Vomiting'[ ]'$ ; Dysphagia[ y]; Melena'[ ]'$ ; Hematochezia '[ ]'$ ; Heartburn'[ ]'$ ; Abdominal pain '[ ]'$ ; Constipation '[ ]'$ ; Diarrhea '[ ]'$ ; BRBPR '[ ]'$   GU: Hematuria'[ ]'$ ; Dysuria '[ ]'$ ; Nocturia'[ ]'$   Vascular: Pain in legs with walking '[ ]'$ ; Pain in feet with lying flat '[ ]'$ ; Non-healing sores '[ ]'$ ; Stroke '[ ]'$ ; TIA '[ ]'$ ; Slurred speech '[ ]'$ ;  Neuro: Headaches'[ ]'$ ; Vertigo'[ ]'$ ; Seizures'[ ]'$ ; Paresthesias'[ ]'$ ;Blurred vision '[ ]'$ ; Diplopia '[ ]'$ ; Vision changes '[ ]'$   Ortho/Skin: Arthritis Blue.Reese ]; Joint pain [ y]; Muscle pain '[ ]'$ ; Joint swelling '[ ]'$ ; Back Pain '[ ]'$ ; Rash [ y]  Psych: Depression'[ ]'$ ; Anxiety'[ ]'$   Heme: Bleeding problems '[ ]'$ ; Clotting  disorders '[ ]'$ ; Anemia '[ ]'$   Endocrine: Diabetes '[ ]'$ ; Thyroid dysfunction'[ ]'$    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, ER/PR+, HER2 negative -- 11-04-2011  left partial mastectomy w/ sln dissection-- completed radiation 01-08-2012-- started tamoxifen 07/ 2013-- per lov note 11/ 2018 no recurrence   Colon polyp    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 Ruthann Cancer at [facility]   systemic sclerosis , diffuse/   rheumotologist:  dr Manuella Ghazi at [facility]---  lov 07-22-2017 (as of 07-25-2017 note not available to read)---  per pt has appt. w/ specialist at [place] thickening    hands, arms, legs  caused by scleroderma   Systemic sclerosis (HCC)    UTI (urinary tract infection)     Current Outpatient Medications  Medication Sig Dispense Refill   acetaminophen (TYLENOL) 325 MG tablet Take 2 tablets (650 mg total) by mouth every 6 (six) hours as needed for mild pain or headache.     amLODipine (NORVASC) 2.5 MG tablet Take 2.5 mg by mouth daily.     Cholecalciferol 1.25 MG (50000 UT) capsule Take 1 capsule (50,000 Units total) by mouth once a week. 4 capsule 2   ELDERBERRY PO Take 1 tablet by mouth 3 (three) times daily.     furosemide (LASIX) 20 MG tablet Take 20 mg by mouth daily.     lisinopril (ZESTRIL) 40 MG tablet Take 1 tablet (40 mg total) by mouth daily. 30 tablet 3   Multiple Vitamin (MULTIVITAMIN) capsule Take 1 capsule by mouth daily.     mycophenolate (CELLCEPT) 500 MG tablet Take 1,000 mg by mouth 2 (two) times daily.     SYNTHROID 125 MCG tablet Take 125 mcg by mouth daily before breakfast.     valACYclovir (VALTREX) 500 MG tablet Take 1 tablet (500 mg total) by mouth daily. 30 tablet 3   No current facility-administered medications for this encounter.    Allergies  Allergen Reactions   Betadine [Povidone Iodine] Rash   Contrast Media [Iodinated  Contrast Media] Rash and Other (See Comments)    "per pt: recently had CT 08/ 2018 even through given pre-medication, IVP still caused rash"   Epinephrine Other (See Comments)    Severe headaches    Iodine Rash and Other (See Comments)   Oxycodone Anxiety   Gabapentin Other (See Comments)    Dizziness   Lidocaine Rash   Nickel Rash and Other (See Comments)    Rash and blisters   Other Rash and Other (See Comments)    Hospital gown   Penicillins Rash and Other (See Comments)    Rash and blisters  Has patient had a PCN reaction causing immediate rash, facial/tongue/throat swelling, SOB or lightheadedness with hypotension: Yes Has patient had a PCN reaction causing severe rash involving mucus membranes or skin necrosis: No Has patient had a PCN reaction that required hospitalization: No Has patient had a PCN reaction occurring within the last 10 years: No If all of the above answers are "NO", then may proceed with Cephalosporin use.    Sulfa Antibiotics Rash      Social History   Socioeconomic History   Marital status: Married    Spouse name: Not on file   Number of children: 1   Years of education: 16   Highest education level: Not on file  Occupational History   Occupation: self employed  Tobacco Use   Smoking status: Never   Smokeless tobacco: Never  Vaping Use   Vaping Use: Never used  Substance and Sexual Activity   Alcohol use: Yes    Comment: Drinks one mixed drink nightly   Drug use: No   Sexual activity: Yes    Birth control/protection: Post-menopausal  Other Topics Concern   Not on file  Social History Narrative   Lives with husband in a 2 story home.  Has 1 daughter.     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     Education: college.    Social Determinants of Health   Financial Resource Strain: Not on file  Food Insecurity: No Food Insecurity (08/09/2022)   Hunger Vital Sign    Worried About Running Out of Food in the Last Year: Never true    Ran Out of  Food in the Last Year: Never true  Transportation Needs: No Transportation Needs (08/09/2022)   PRAPARE - Hydrologist (Medical): No    Lack of Transportation (Non-Medical): No  Physical Activity: Not on file  Stress: Not on file  Social Connections: Not on file  Intimate Partner Violence: Not At Risk (08/09/2022)   Humiliation, Afraid, Rape, and Kick questionnaire    Fear of Current or Ex-Partner: No    Emotionally Abused: No    Physically Abused: No    Sexually Abused: No      Family History  Problem Relation Age of Onset   Heart disease Mother    Heart failure Mother    Heart disease Father    Ovarian cancer Maternal Aunt    Heart disease Paternal Uncle        multiple   Autoimmune disease Sister    Arrhythmia Sister    Anemia Sister    CAD Brother    Colon cancer Neg Hx    Stomach cancer Neg Hx    Esophageal cancer Neg Hx    Rectal cancer Neg Hx     Vitals:   09/16/22 1010  BP: 122/68  Pulse: 89  SpO2: 100%  Weight: 67.1 kg (148 lb)    PHYSICAL EXAM: General:  Well appearing. No respiratory difficulty HEENT: normal + telengectasias  Neck: supple. no JVD. Carotids 2+ bilat; no bruits. No lymphadenopathy or thryomegaly appreciated. Cor: PMI nondisplaced. Regular rate & rhythm. No rubs, gallops or murmurs. Lungs: clear Abdomen: soft, nontender, nondistended. No hepatosplenomegaly. No bruits or masses. Good bowel sounds. Extremities: no cyanosis, clubbing, rash, 1+ edema + skin tightening Neuro: alert & oriented x 3, cranial nerves grossly intact. moves all 4 extremities w/o difficulty. Affect pleasant.  ECG: NSR 86 No ST-T wave abnormalities. Personally reviewed  ASSESSMENT & PLAN:  1. Scleroderma c/b ILD -  we discussed the incidence of ILD and PAH in patients with scleroderma - currently no evidence of RV dysfunction on echo  - given decreased DLCO and pericardial effusion will need RHC to further evaluate  2. Pericardial  effusion - I reviewed echo this is small to moderate. No tamponade - Suspect inflammatory and will improved with Cellcept - RHC as above - Repeat echo in 2 months  3. ILD - followed by Dr. Chase Caller - if has Morrison on RHC can consider Tyvaso  4. Recent scleroderma renal crisis - resolving - following with renal  - continue lisinopril and lasix - can add spiro as needed  5. CV pre-op clearance - ok to proceed with EGD without further cardiac testing (low risk)  Glori Bickers, MD  10:31 AM

## 2022-09-16 NOTE — H&P (View-Only) (Signed)
 ADVANCED HF CLINIC CONSULT NOTE  Referring Physician: Dr. Ramaswamy Primary Care: Copland, Jessica C, MD Primary Cardiologist: None Rheumatology: Dr. Shah at [facility] and Dr. Ami Shah @ [facility]  HPI:  [name] is a 71 y/o woman with scleroderma, ILD previous breast CA treated with XRT 2013 referred for further evaluation of possible PAH  She was first diagnosed with scleroderma in 2017 based on a high titer ANA and skin thickening. She has been followed previously in the [facility] center and now followed by Dr . Ramaswamy. She was started on CellCept in December 2018 with significant improvement in FVC and diffusion capacity. Her CellCept was stopped in January 2023 due to prolonged episode of shingles.   She was recently admitted to [facility] for scleroderma renal crisis in late January 24. BP 225/117. By the time of discharge, she had improvement but not normalization of her creatinine (peak Scr 2.8. Now down to 1.35). Blood pressure was better controlled. Mycophenolate was added back due to a worsening of her skin disease (now on 1000mg BID). Since she was discharged from the hospital, she feels like she is slowly improving but continues to have increased edema.  She was recently seen in Pulmonary at [facility] (2/24) who reviewed data and agreed with continuing Cellcept in setting of worsening lung function   PFT 05/14/2022 [facility]  FEV1/FVC 77  FVC 2.05-73%  FEV1 1.57-73%  TLC 3.09-62%  RV 1.04-49%  RV/TLC normal DLCO 10.20-52%  DLCO/Va   Consistent with mild restriction and moderate reduction in diffusion capacity. These are significantly worse than her PFTs from 2022, when she had an FVC of 2.75 and a DLCO of 12.62   Echo: 1.27.2024 during admit for renal crisis LVEF >60-65% G1 DD RV normal. IVC normal: Moderate pericardial effusion without signs of tamponade  Has had routine echos by never had a RHC  Here with her husband. Taking lasix daily. SBPs now range 120-130s with HR 80-90s  on lisinopril 40. Also taking lasix 20 daily. Scheduled for EGD on 3/14 due to worsening dysphagia. Says breathing doing pretty good. Can do all ADLs but goes slowly. Works with PT. Rides stationary bike a bit. Went to grocery yesterday and was ok. No syncope or presyncope. LE edema much improved.     Review of Systems: [y] = yes, [ ] = no   General: Weight gain [ ]; Weight loss [ ]; Anorexia [ ]; Fatigue [y ]; Fever [ ]; Chills [ ]; Weakness [ ]  Cardiac: Chest pain/pressure [ ]; Resting SOB [ ]; Exertional SOB [ y]; Orthopnea [ ]; Pedal Edema [ y]; Palpitations [ ]; Syncope [ ]; Presyncope [ ]; Paroxysmal nocturnal dyspnea[ ]  Pulmonary: Cough [ ]; Wheezing[ ]; Hemoptysis[ ]; Sputum [ ]; Snoring [ ]  GI: Vomiting[ ]; Dysphagia[ y]; Melena[ ]; Hematochezia [ ]; Heartburn[ ]; Abdominal pain [ ]; Constipation [ ]; Diarrhea [ ]; BRBPR [ ]  GU: Hematuria[ ]; Dysuria [ ]; Nocturia[ ]  Vascular: Pain in legs with walking [ ]; Pain in feet with lying flat [ ]; Non-healing sores [ ]; Stroke [ ]; TIA [ ]; Slurred speech [ ];  Neuro: Headaches[ ]; Vertigo[ ]; Seizures[ ]; Paresthesias[ ];Blurred vision [ ]; Diplopia [ ]; Vision changes [ ]  Ortho/Skin: Arthritis [y ]; Joint pain [ y]; Muscle pain [ ]; Joint swelling [ ]; Back Pain [ ]; Rash [ y]  Psych: Depression[ ]; Anxiety[ ]  Heme: Bleeding problems [ ]; Clotting   disorders [ ]; Anemia [ ]  Endocrine: Diabetes [ ]; Thyroid dysfunction[ ]   Past Medical History:  Diagnosis Date   Anemia    Breastr cancer, IDC, Left UOQ, clinical stage II Receptor +, Her 2 - 08/29/2011 DX   oncologist-- dr magrinat--  Stage IIA, 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, ER/PR+, HER2 negative -- 11-04-2011  left partial mastectomy w/ sln dissection-- completed radiation 01-08-2012-- started tamoxifen 07/ 2013-- per lov note 11/ 2018 no recurrence   Colon polyp    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 marshall at [facility]   systemic sclerosis , diffuse/   rheumotologist:  dr shah at [facility]---  lov 07-22-2017 (as of 07-25-2017 note not available to read)---  per pt has appt. w/ specialist at [facility]   Skin thickening    hands, arms, legs  caused by scleroderma   Systemic sclerosis (HCC)    UTI (urinary tract infection)     Current Outpatient Medications  Medication Sig Dispense Refill   acetaminophen (TYLENOL) 325 MG tablet Take 2 tablets (650 mg total) by mouth every 6 (six) hours as needed for mild pain or headache.     amLODipine (NORVASC) 2.5 MG tablet Take 2.5 mg by mouth daily.     Cholecalciferol 1.25 MG (50000 UT) capsule Take 1 capsule (50,000 Units total) by mouth once a week. 4 capsule 2   ELDERBERRY PO Take 1 tablet by mouth 3 (three) times daily.     furosemide (LASIX) 20 MG tablet Take 20 mg by mouth daily.     lisinopril (ZESTRIL) 40 MG tablet Take 1 tablet (40 mg total) by mouth daily. 30 tablet 3   Multiple Vitamin (MULTIVITAMIN) capsule Take 1 capsule by mouth daily.     mycophenolate (CELLCEPT) 500 MG tablet Take 1,000 mg by mouth 2 (two) times daily.     SYNTHROID 125 MCG tablet Take 125 mcg by mouth daily before breakfast.     valACYclovir (VALTREX) 500 MG tablet Take 1 tablet (500 mg total) by mouth daily. 30 tablet 3   No current facility-administered medications for this encounter.    Allergies  Allergen Reactions   Betadine [Povidone Iodine] Rash   Contrast Media [Iodinated  Contrast Media] Rash and Other (See Comments)    "per pt: recently had CT 08/ 2018 even through given pre-medication, IVP still caused rash"   Epinephrine Other (See Comments)    Severe headaches    Iodine Rash and Other (See Comments)   Oxycodone Anxiety   Gabapentin Other (See Comments)    Dizziness   Lidocaine Rash   Nickel Rash and Other (See Comments)    Rash and blisters   Other Rash and Other (See Comments)    Hospital gown   Penicillins Rash and Other (See Comments)    Rash and blisters   Has patient had a PCN reaction causing immediate rash, facial/tongue/throat swelling, SOB or lightheadedness with hypotension: Yes Has patient had a PCN reaction causing severe rash involving mucus membranes or skin necrosis: No Has patient had a PCN reaction that required hospitalization: No Has patient had a PCN reaction occurring within the last 10 years: No If all of the above answers are "NO", then may proceed with Cephalosporin use.    Sulfa Antibiotics Rash      Social History   Socioeconomic History   Marital status: Married    Spouse name: Not on file   Number of children: 1   Years of education: 16   Highest education level: Not on file  Occupational History   Occupation: self employed  Tobacco Use   Smoking status: Never   Smokeless tobacco: Never  Vaping Use   Vaping Use: Never used  Substance and Sexual Activity   Alcohol use: Yes    Comment: Drinks one mixed drink nightly   Drug use: No   Sexual activity: Yes    Birth control/protection: Post-menopausal  Other Topics Concern   Not on file  Social History Narrative   Lives with husband in a 2 story home.  Has 1 daughter.     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     Education: college.    Social Determinants of Health   Financial Resource Strain: Not on file  Food Insecurity: No Food Insecurity (08/09/2022)   Hunger Vital Sign    Worried About Running Out of Food in the Last Year: Never true    Ran Out of  Food in the Last Year: Never true  Transportation Needs: No Transportation Needs (08/09/2022)   PRAPARE - Transportation    Lack of Transportation (Medical): No    Lack of Transportation (Non-Medical): No  Physical Activity: Not on file  Stress: Not on file  Social Connections: Not on file  Intimate Partner Violence: Not At Risk (08/09/2022)   Humiliation, Afraid, Rape, and Kick questionnaire    Fear of Current or Ex-Partner: No    Emotionally Abused: No    Physically Abused: No    Sexually Abused: No      Family History  Problem Relation Age of Onset   Heart disease Mother    Heart failure Mother    Heart disease Father    Ovarian cancer Maternal Aunt    Heart disease Paternal Uncle        multiple   Autoimmune disease Sister    Arrhythmia Sister    Anemia Sister    CAD Brother    Colon cancer Neg Hx    Stomach cancer Neg Hx    Esophageal cancer Neg Hx    Rectal cancer Neg Hx     Vitals:   09/16/22 1010  BP: 122/68  Pulse: 89  SpO2: 100%  Weight: 67.1 kg (148 lb)    PHYSICAL EXAM: General:  Well appearing. No respiratory difficulty HEENT: normal + telengectasias  Neck: supple. no JVD. Carotids 2+ bilat; no bruits. No lymphadenopathy or thryomegaly appreciated. Cor: PMI nondisplaced. Regular rate & rhythm. No rubs, gallops or murmurs. Lungs: clear Abdomen: soft, nontender, nondistended. No hepatosplenomegaly. No bruits or masses. Good bowel sounds. Extremities: no cyanosis, clubbing, rash, 1+ edema + skin tightening Neuro: alert & oriented x 3, cranial nerves grossly intact. moves all 4 extremities w/o difficulty. Affect pleasant.  ECG: NSR 86 No ST-T wave abnormalities. Personally reviewed  ASSESSMENT & PLAN:  1. Scleroderma c/b ILD -   we discussed the incidence of ILD and PAH in patients with scleroderma - currently no evidence of RV dysfunction on echo  - given decreased DLCO and pericardial effusion will need RHC to further evaluate  2. Pericardial  effusion - I reviewed echo this is small to moderate. No tamponade - Suspect inflammatory and will improved with Cellcept - RHC as above - Repeat echo in 2 months  3. ILD - followed by Dr. Ramaswamy - if has PAH on RHC can consider Tyvaso  4. Recent scleroderma renal crisis - resolving - following with renal  - continue lisinopril and lasix - can add spiro as needed  5. CV pre-op clearance - ok to proceed with EGD without further cardiac testing (low risk)  Cleveland Yarbro, MD  10:31 AM  

## 2022-09-16 NOTE — Patient Instructions (Addendum)
No Labs done today.   No medication changes were made. Please continue all current medications as prescribed.  Your physician recommends that you schedule a follow-up appointment in: 2 months with an echo prior to your appointment.  Your physician has requested that you have an echocardiogram. Echocardiography is a painless test that uses sound waves to create images of your heart. It provides your doctor with information about the size and shape of your heart and how well your heart's chambers and valves are working. This procedure takes approximately one hour. There are no restrictions for this procedure. Please do NOT wear cologne, perfume, aftershave, or lotions (deodorant is allowed). Please arrive 15 minutes prior to your appointment time.  If you have any questions or concerns before your next appointment please send [place] a message through [place] or call our office at [phone].    TO LEAVE A MESSAGE FOR THE NURSE SELECT OPTION 2, PLEASE LEAVE A MESSAGE INCLUDING: YOUR NAME DATE OF BIRTH CALL BACK NUMBER REASON FOR CALL**this is important as we prioritize the call backs  YOU WILL RECEIVE A CALL BACK THE SAME DAY AS LONG AS YOU CALL BEFORE 4:00 PM   Do the following things EVERYDAY: Weigh yourself in the morning before breakfast. Write it down and keep it in a log. Take your medicines as prescribed Eat low salt foods--Limit salt (sodium) to 2000 mg per day.  Stay as active as you can everyday Limit all fluids for the day to less than 2 liters   At the [facility], you and your health needs are our priority. As part of our continuing mission to provide you with exceptional heart care, we have created designated Provider Care Teams. These Care Teams include your primary Cardiologist (physician) and Advanced Practice Providers (APPs- Physician Assistants and Nurse Practitioners) who all work together to provide you with the care you need, when you need it.   You  may see any of the following providers on your designated Care Team at your next follow up: Dr Glori Bickers Dr Haynes Kerns, NP Lyda Jester, [place] Audry Riles, PharmD   Please be sure to bring in all your medications bottles to every appointment.    You are scheduled for a Cardiac Catheterization on Tuesday, March 19 with Dr. Glori Bickers.  1. Please arrive at the [facility] (Main Entrance A) at [facility]: [address] [place], [place] [address] at 5:30 AM (This time is two hours before your procedure to ensure your preparation). Free valet parking service is available.   Special note: Every effort is made to have your procedure done on time. Please understand that emergencies sometimes delay scheduled procedures.  2. Diet: Do not eat solid foods after midnight.  The patient may have clear liquids until 5am upon the day of the procedure.  3. Medication instructions in preparation for your procedure:  DO NOT TAKE THE LASIX THE DAY OF YOUR PROCEDURE.  On the morning of your procedure, take any morning medicines NOT listed above.  You may use sips of water.  5. Plan for one night stay--bring personal belongings. 6. Bring a current list of your medications and current insurance cards. 7. You MUST have a responsible person to drive you home. 8. Someone MUST be with you the first 24 hours after you arrive home or your discharge will be delayed. 9. Please wear clothes that are easy to get on and off and wear slip-on shoes.  Thank  you for allowing [place] to care for you!   -- [facility]

## 2022-09-16 NOTE — Addendum Note (Signed)
Encounter addended by: Jolaine Artist, MD on: 09/16/2022 12:30 PM  Actions taken: Clinical Note Signed

## 2022-09-17 DIAGNOSIS — M349 Systemic sclerosis, unspecified: Secondary | ICD-10-CM | POA: Diagnosis not present

## 2022-09-17 DIAGNOSIS — R0602 Shortness of breath: Secondary | ICD-10-CM | POA: Diagnosis not present

## 2022-09-17 DIAGNOSIS — M25642 Stiffness of left hand, not elsewhere classified: Secondary | ICD-10-CM | POA: Diagnosis not present

## 2022-09-17 DIAGNOSIS — M25641 Stiffness of right hand, not elsewhere classified: Secondary | ICD-10-CM | POA: Diagnosis not present

## 2022-09-17 DIAGNOSIS — M79642 Pain in left hand: Secondary | ICD-10-CM | POA: Diagnosis not present

## 2022-09-17 DIAGNOSIS — M79641 Pain in right hand: Secondary | ICD-10-CM | POA: Diagnosis not present

## 2022-09-18 NOTE — Telephone Encounter (Signed)
Since have not been able to reach pt via phone, sent mychart message to pt.

## 2022-09-19 ENCOUNTER — Encounter (HOSPITAL_COMMUNITY): Payer: Self-pay | Admitting: Internal Medicine

## 2022-09-19 ENCOUNTER — Ambulatory Visit (INDEPENDENT_AMBULATORY_CARE_PROVIDER_SITE_OTHER): Payer: Medicare Other | Admitting: Family Medicine

## 2022-09-19 ENCOUNTER — Encounter: Payer: Self-pay | Admitting: Family Medicine

## 2022-09-19 ENCOUNTER — Other Ambulatory Visit: Payer: Medicare Other

## 2022-09-19 VITALS — BP 122/60 | HR 82 | Temp 97.6°F | Resp 18 | Ht 64.02 in | Wt 146.6 lb

## 2022-09-19 DIAGNOSIS — M349 Systemic sclerosis, unspecified: Secondary | ICD-10-CM | POA: Diagnosis not present

## 2022-09-19 DIAGNOSIS — R1310 Dysphagia, unspecified: Secondary | ICD-10-CM

## 2022-09-19 DIAGNOSIS — E039 Hypothyroidism, unspecified: Secondary | ICD-10-CM

## 2022-09-19 DIAGNOSIS — Z170 Estrogen receptor positive status [ER+]: Secondary | ICD-10-CM

## 2022-09-19 DIAGNOSIS — M3489 Other systemic sclerosis: Secondary | ICD-10-CM

## 2022-09-19 DIAGNOSIS — C50412 Malignant neoplasm of upper-outer quadrant of left female breast: Secondary | ICD-10-CM

## 2022-09-19 LAB — CBC
HCT: 34.7 % — ABNORMAL LOW (ref 36.0–46.0)
Hemoglobin: 11.6 g/dL — ABNORMAL LOW (ref 12.0–15.0)
MCHC: 33.4 g/dL (ref 30.0–36.0)
MCV: 90.8 fl (ref 78.0–100.0)
Platelets: 301 10*3/uL (ref 150.0–400.0)
RBC: 3.83 Mil/uL — ABNORMAL LOW (ref 3.87–5.11)
RDW: 19 % — ABNORMAL HIGH (ref 11.5–15.5)
WBC: 9.6 10*3/uL (ref 4.0–10.5)

## 2022-09-19 LAB — CK: Total CK: 534 U/L — ABNORMAL HIGH (ref 7–177)

## 2022-09-19 LAB — COMPREHENSIVE METABOLIC PANEL
ALT: 21 U/L (ref 0–35)
AST: 46 U/L — ABNORMAL HIGH (ref 0–37)
Albumin: 4 g/dL (ref 3.5–5.2)
Alkaline Phosphatase: 59 U/L (ref 39–117)
BUN: 18 mg/dL (ref 6–23)
CO2: 24 mEq/L (ref 19–32)
Calcium: 9.7 mg/dL (ref 8.4–10.5)
Chloride: 102 mEq/L (ref 96–112)
Creatinine, Ser: 1.28 mg/dL — ABNORMAL HIGH (ref 0.40–1.20)
GFR: 41.59 mL/min — ABNORMAL LOW (ref >60.00)
Glucose, Bld: 88 mg/dL (ref 70–99)
Potassium: 4.3 mEq/L (ref 3.5–5.1)
Sodium: 136 mEq/L (ref 135–145)
Total Bilirubin: 0.8 mg/dL (ref 0.2–1.2)
Total Protein: 6.7 g/dL (ref 6.0–8.3)

## 2022-09-19 MED ORDER — LEVOTHYROXINE SODIUM 100 MCG PO TABS: 100.0000 ug | ORAL_TABLET | Freq: Every day | ORAL | 3 refills | Status: DC

## 2022-09-19 NOTE — Patient Instructions (Addendum)
It was good to see you again today- I will be in touch with your labs asap   pro-BNP, CBC, CMP,CK today!

## 2022-09-23 ENCOUNTER — Ambulatory Visit: Payer: Medicare Other

## 2022-09-23 LAB — CARDIO IQ® NT PROBNP: NT PROBNP: 410 pg/mL — ABNORMAL HIGH (ref <125)

## 2022-09-24 ENCOUNTER — Encounter: Payer: Self-pay | Admitting: Family Medicine

## 2022-09-25 ENCOUNTER — Other Ambulatory Visit (INDEPENDENT_AMBULATORY_CARE_PROVIDER_SITE_OTHER): Payer: Medicare Other

## 2022-09-25 ENCOUNTER — Encounter: Payer: Self-pay | Admitting: Family Medicine

## 2022-09-25 DIAGNOSIS — M349 Systemic sclerosis, unspecified: Secondary | ICD-10-CM | POA: Diagnosis not present

## 2022-09-25 LAB — COMPREHENSIVE METABOLIC PANEL
ALT: 21 U/L (ref 0–35)
AST: 42 U/L — ABNORMAL HIGH (ref 0–37)
Albumin: 3.8 g/dL (ref 3.5–5.2)
Alkaline Phosphatase: 61 U/L (ref 39–117)
BUN: 16 mg/dL (ref 6–23)
CO2: 23 mEq/L (ref 19–32)
Calcium: 9.4 mg/dL (ref 8.4–10.5)
Chloride: 103 mEq/L (ref 96–112)
Creatinine, Ser: 1.32 mg/dL — ABNORMAL HIGH (ref 0.40–1.20)
GFR: 40.08 mL/min — ABNORMAL LOW (ref >60.00)
Glucose, Bld: 105 mg/dL — ABNORMAL HIGH (ref 70–99)
Potassium: 4.2 mEq/L (ref 3.5–5.1)
Sodium: 136 mEq/L (ref 135–145)
Total Bilirubin: 0.4 mg/dL (ref 0.2–1.2)
Total Protein: 6.4 g/dL (ref 6.0–8.3)

## 2022-09-25 LAB — CBC
HCT: 36.6 % (ref 36.0–46.0)
Hemoglobin: 12 g/dL (ref 12.0–15.0)
MCHC: 32.9 g/dL (ref 30.0–36.0)
MCV: 90.9 fl (ref 78.0–100.0)
Platelets: 280 10*3/uL (ref 150.0–400.0)
RBC: 4.02 Mil/uL (ref 3.87–5.11)
RDW: 19.6 % — ABNORMAL HIGH (ref 11.5–15.5)
WBC: 8.2 10*3/uL (ref 4.0–10.5)

## 2022-09-25 LAB — CK: Total CK: 498 U/L — ABNORMAL HIGH (ref 7–177)

## 2022-09-26 ENCOUNTER — Other Ambulatory Visit: Payer: Medicare Other

## 2022-09-26 ENCOUNTER — Encounter: Payer: Self-pay | Admitting: Gastroenterology

## 2022-09-26 ENCOUNTER — Ambulatory Visit (AMBULATORY_SURGERY_CENTER): Payer: Medicare Other | Admitting: Gastroenterology

## 2022-09-26 VITALS — BP 152/69 | HR 85 | Temp 98.0°F | Resp 19 | Ht 64.0 in | Wt 156.0 lb

## 2022-09-26 DIAGNOSIS — K2950 Unspecified chronic gastritis without bleeding: Secondary | ICD-10-CM | POA: Diagnosis not present

## 2022-09-26 DIAGNOSIS — F32A Depression, unspecified: Secondary | ICD-10-CM | POA: Diagnosis not present

## 2022-09-26 DIAGNOSIS — R1310 Dysphagia, unspecified: Secondary | ICD-10-CM | POA: Diagnosis not present

## 2022-09-26 DIAGNOSIS — K208 Other esophagitis without bleeding: Secondary | ICD-10-CM | POA: Diagnosis not present

## 2022-09-26 DIAGNOSIS — I10 Essential (primary) hypertension: Secondary | ICD-10-CM | POA: Diagnosis not present

## 2022-09-26 DIAGNOSIS — K209 Esophagitis, unspecified without bleeding: Secondary | ICD-10-CM

## 2022-09-26 DIAGNOSIS — E039 Hypothyroidism, unspecified: Secondary | ICD-10-CM | POA: Diagnosis not present

## 2022-09-26 DIAGNOSIS — E785 Hyperlipidemia, unspecified: Secondary | ICD-10-CM | POA: Diagnosis not present

## 2022-09-26 MED ORDER — SODIUM CHLORIDE 0.9 % IV SOLN: 500.0000 mL | Freq: Once | INTRAVENOUS | Status: DC

## 2022-09-26 NOTE — Progress Notes (Signed)
[facility] History and Physical   Primary Care Physician:  Darreld Mclean, MD   Reason for Procedure:  Dysphagia  Plan:    EGD  with possible interventions as needed     HPI: [name] is a very pleasant 72 y.o. female here for EGD with dilation for dysphagia. H/o scleroderma.  Please refer to office visit note by Alonza Bogus 08/06/22 for additional details  The risks and benefits as well as alternatives of endoscopic procedure(s) have been discussed and reviewed. All questions answered. The patient agrees to proceed.    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 Ruthann Cancer at [facility]   systemic sclerosis , diffuse/   rheumotologist:  dr Manuella Ghazi at [facility]---  lov 07-22-2017 (as of 07-25-2017 note not available to read)---  per pt has appt. w/ specialist at [facility]   Skin thickening    hands, arms, legs  caused by scleroderma   Systemic sclerosis ([place])    UTI (urinary tract infection)     Past Surgical  History:  Procedure Laterality Date   BIOPSY  01/29/2018   Procedure: BIOPSY;  Surgeon: Jerene Bears, MD;  Location: [facility];  Service: Gastroenterology;;   Lillard Anes  2000   Left   COLONOSCOPY WITH PROPOFOL N/A 01/29/2018   Procedure: COLONOSCOPY WITH PROPOFOL;  Surgeon: Jerene Bears, MD;  Location: [facility];  Service: Gastroenterology;  Laterality: N/A;   DILATATION & CURETTAGE/HYSTEROSCOPY WITH MYOSURE N/A 08/04/2017   Procedure: DILATATION & CURETTAGE/HYSTEROSCOPY WITH MYOSURE;  Surgeon: Megan Salon, MD;  Location: [facility];  Service: Gynecology;  Laterality: N/A;   ECTOPIC PREGNANCY SURGERY  1986-88   s/p unilateral salpingectomy   ESOPHAGOGASTRODUODENOSCOPY (EGD) WITH PROPOFOL N/A 01/29/2018   Procedure: ESOPHAGOGASTRODUODENOSCOPY (EGD) WITH PROPOFOL;  Surgeon: Jerene Bears, MD;  Location: [facility];  Service: Gastroenterology;  Laterality: N/A;   PARTIAL MASTECTOMY WITH AXILLARY SENTINEL LYMPH NODE BIOPSY Left 11-04-2011   dr Margot Chimes  [facility]   TRANSTHORACIC ECHOCARDIOGRAM  06-04-2017    [facility]   moderate LVH, ef>55%/  trivial AR and TR/ mild MR and PR/  trivial pericardial effusion   WISDOM TOOTH EXTRACTION      Prior to Admission medications   Medication Sig Start Date End Date Taking? Authorizing Provider  acetaminophen (TYLENOL) 325 MG tablet Take 2 tablets (650 mg total) by mouth every 6 (six) hours  as needed for mild pain or headache. 08/10/22  Yes Danford, Suann Larry, MD  ELDERBERRY PO Take 1 tablet by mouth 3 (three) times daily.   Yes [provider]  furosemide (LASIX) 20 MG tablet Take 20 mg by mouth daily. 12/01/16  Yes [provider]  levothyroxine (SYNTHROID) 100 MCG tablet Take 1 tablet (100 mcg total) by mouth daily. 09/19/22  Yes Copland, Gay Filler, MD  lisinopril (ZESTRIL) 40 MG tablet Take 1 tablet (40 mg total) by mouth daily. 09/13/22  Yes Brand Males, MD  Multiple Vitamin (MULTIVITAMIN) capsule Take 1 capsule  by mouth daily. 09/03/15  Yes [provider]  mycophenolate (CELLCEPT) 500 MG tablet Take 1,500 mg by mouth 2 (two) times daily. 08/15/22  Yes [provider]  SYNTHROID 25 MCG tablet Take 25 mcg by mouth daily before breakfast. 09/16/22  Yes [provider]  valACYclovir (VALTREX) 500 MG tablet Take 1 tablet (500 mg total) by mouth daily. 09/13/22  Yes Brand Males, MD  amLODipine (NORVASC) 2.5 MG tablet Take 2.5 mg by mouth daily. Patient not taking: Reported on 09/26/2022    [provider]  Cholecalciferol 1.25 MG (50000 UT) capsule Take 1 capsule (50,000 Units total) by mouth once a week. 09/13/22 11/02/22  Brand Males, MD  SYNTHROID 125 MCG tablet Take 125 mcg by mouth daily before breakfast. Patient not taking: Reported on 09/26/2022    [provider]    Current Outpatient Medications  Medication Sig Dispense Refill   acetaminophen (TYLENOL) 325 MG tablet Take 2 tablets (650 mg total) by mouth every 6 (six) hours as needed for mild pain or headache.     ELDERBERRY PO Take 1 tablet by mouth 3 (three) times daily.     furosemide (LASIX) 20 MG tablet Take 20 mg by mouth daily.     levothyroxine (SYNTHROID) 100 MCG tablet Take 1 tablet (100 mcg total) by mouth daily. 90 tablet 3   lisinopril (ZESTRIL) 40 MG tablet Take 1 tablet (40 mg total) by mouth daily. 30 tablet 3   Multiple Vitamin (MULTIVITAMIN) capsule Take 1 capsule by mouth daily.     mycophenolate (CELLCEPT) 500 MG tablet Take 1,500 mg by mouth 2 (two) times daily.     SYNTHROID 25 MCG tablet Take 25 mcg by mouth daily before breakfast.     valACYclovir (VALTREX) 500 MG tablet Take 1 tablet (500 mg total) by mouth daily. 30 tablet 3   amLODipine (NORVASC) 2.5 MG tablet Take 2.5 mg by mouth daily. (Patient not taking: Reported on 09/26/2022)     Cholecalciferol 1.25 MG (50000 UT) capsule Take 1 capsule (50,000 Units total) by mouth once a week. 4 capsule 2   SYNTHROID 125 MCG tablet  Take 125 mcg by mouth daily before breakfast. (Patient not taking: Reported on 09/26/2022)     Current Facility-Administered Medications  Medication Dose Route Frequency Provider Last Rate Last Admin   0.9 %  sodium chloride infusion  500 mL Intravenous Once Mauri Pole, MD        Allergies as of 09/26/2022 - Review Complete 09/26/2022  Allergen Reaction Noted   Betadine [povidone iodine] Rash 07/25/2017   Contrast media [iodinated contrast media] Rash and Other (See Comments) 07/25/2017   Epinephrine Other (See Comments) 09/13/2011   Iodine Rash and Other (See Comments) 09/13/2011   Oxycodone Anxiety 11/14/2011   Prednisone Anaphylaxis 09/19/2022   Gabapentin Other (See Comments) 03/21/2022   Lidocaine Rash 02/20/2022   Nickel Rash and Other (  See Comments) 09/13/2011   Other Rash and Other (See Comments) 02/06/2012   Penicillins Rash and Other (See Comments) 09/13/2011   Sulfa antibiotics Rash 07/25/2017    Family History  Problem Relation Age of Onset   Heart disease Mother    Heart failure Mother    Heart disease Father    Ovarian cancer Maternal Aunt    Heart disease Paternal Uncle        multiple   Autoimmune disease Sister    Arrhythmia Sister    Anemia Sister    CAD Brother    Colon cancer Neg Hx    Stomach cancer Neg Hx    Esophageal cancer Neg Hx    Rectal cancer Neg Hx     Social History   Socioeconomic History   Marital status: Married    Spouse name: Not on file   Number of children: 1   Years of education: 16   Highest education level: Not on file  Occupational History   Occupation: self employed  Tobacco Use   Smoking status: Never   Smokeless tobacco: Never  Vaping Use   Vaping Use: Never used  Substance and Sexual Activity   Alcohol use: Yes    Comment: socially   Drug use: No   Sexual activity: Yes    Birth control/protection: Post-menopausal  Other Topics Concern   Not on file  Social History Narrative   Lives with husband in a  2 story home.  Has 1 daughter.     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     Education: college.    Social Determinants of Health   Financial Resource Strain: Not on file  Food Insecurity: No Food Insecurity (08/09/2022)   Hunger Vital Sign    Worried About Running Out of Food in the Last Year: Never true    Ran Out of Food in the Last Year: Never true  Transportation Needs: No Transportation Needs (08/09/2022)   PRAPARE - Hydrologist (Medical): No    Lack of Transportation (Non-Medical): No  Physical Activity: Not on file  Stress: Not on file  Social Connections: Not on file  Intimate Partner Violence: Not At Risk (08/09/2022)   Humiliation, Afraid, Rape, and Kick questionnaire    Fear of Current or Ex-Partner: No    Emotionally Abused: No    Physically Abused: No    Sexually Abused: No    Review of Systems:  All other review of systems negative except as mentioned in the HPI.  Physical Exam: Vital signs in last 24 hours: Blood Pressure (Abnormal) 124/45   Pulse 83   Temperature 98 F (36.7 C) (Temporal)   Height '5\' 4"'$  (1.626 m)   Weight 156 lb (70.8 kg)   Oxygen Saturation 96%   Body Mass Index 26.78 kg/m  General:   Alert, NAD Lungs:  Clear .   Heart:  Regular rate and rhythm Abdomen:  Soft, nontender and nondistended. Neuro/Psych:  Alert and cooperative. Normal mood and affect. A and O x 3  Reviewed labs, radiology imaging, old records and pertinent past GI work up  Patient is appropriate for planned procedure(s) and anesthesia in an ambulatory setting   K. Denzil Magnuson , MD [phone]

## 2022-09-26 NOTE — Op Note (Signed)
[place] Patient Name: [name] Procedure Date: 09/26/2022 10:16 AM MRN: [id number] Endoscopist: Mauri Pole , MD, [id number] Age: 72 Referring MD:  Date of Birth: [birth year] Gender: Female Account #: [id number] Procedure:                Upper GI endoscopy Indications:              Dysphagia Medicines:                Monitored Anesthesia Care Procedure:                Pre-Anesthesia Assessment:                           - Prior to the procedure, a History and Physical                            was performed, and patient medications and                            allergies were reviewed. The patient's tolerance of                            previous anesthesia was also reviewed. The risks                            and benefits of the procedure and the sedation                            options and risks were discussed with the patient.                            All questions were answered, and informed consent                            was obtained. Prior Anticoagulants: The patient has                            taken no anticoagulant or antiplatelet agents. ASA                            Grade Assessment: III - A patient with severe                            systemic disease. After reviewing the risks and                            benefits, the patient was deemed in satisfactory                            condition to undergo the procedure.                           After obtaining informed consent, the endoscope was  passed under direct vision. Throughout the                            procedure, the patient's blood pressure, pulse, and                            oxygen saturations were monitored continuously. The                            Olympus Scope [id number] was introduced through the                            mouth, and advanced to the second part of duodenum.                            The upper GI endoscopy was  accomplished without                            difficulty. The patient tolerated the procedure                            well. Scope In: Scope Out: Findings:                 One benign-appearing, intrinsic mild stenosis was                            found 33 to 34 cm from the incisors. This stenosis                            measured 1.6 cm (inner diameter) x 1 cm (in                            length). The stenosis was traversed. A TTS dilator                            was passed through the scope. Dilation with a                            15-16.5-18 mm balloon dilator was performed to 18                            mm. The dilation site was examined following                            endoscope reinsertion and showed mild mucosal                            disruption.                           LA Grade C (one or more mucosal breaks continuous  between tops of 2 or more mucosal folds, less than                            75% circumference) esophagitis was found 32 to 34                            cm from the incisors.                           Two superficial esophageal ulcers with no bleeding                            and no stigmata of recent bleeding , mucosal                            nodularity were found 32 to 34 cm from the incisors                            at EG junction. The largest lesion was 3 mm in                            largest dimension. Biopsies were taken with a cold                            forceps for histology.                           A 3 cm hiatal hernia was present.                           Patchy mild inflammation characterized by                            congestion (edema), erosions and erythema was found                            in the entire examined stomach. Biopsies were taken                            with a cold forceps for Helicobacter pylori testing.                           The exam of the stomach was  otherwise normal.                           The examined duodenum was normal. Complications:            No immediate complications. Estimated Blood Loss:     Estimated blood loss was minimal. Impression:               - Benign-appearing esophageal stenosis. Dilated.                           - LA Grade C reflux esophagitis.                           -  Esophageal ulcers with no bleeding and no                            stigmata of recent bleeding. Biopsied.                           - 3 cm hiatal hernia.                           - Gastritis. Biopsied.                           - Normal examined duodenum. Recommendation:           - Patient has a contact number available for                            emergencies. The signs and symptoms of potential                            delayed complications were discussed with the                            patient. Return to normal activities tomorrow.                            Written discharge instructions were provided to the                            patient.                           - Resume previous diet.                           - Continue present medications.                           - Await pathology results.                           - Follow an antireflux regimen.                           - Use Protonix (pantoprazole) 40 mg PO BID for 3                            months followed by '40mg'$  daily. Mauri Pole, MD 09/26/2022 10:47:04 AM This report has been signed electronically.

## 2022-09-26 NOTE — Progress Notes (Signed)
Called to room to assist during endoscopic procedure.  Patient ID and intended procedure confirmed with present staff. Received instructions for my participation in the procedure from the performing physician.  

## 2022-09-26 NOTE — Progress Notes (Signed)
Uneventful anesthetic. Report to pacu rn. Vss. Care resumed by rn. 

## 2022-09-26 NOTE — Patient Instructions (Addendum)
Start Pantoprazole '40mg'$  twice a day for 3 months and then '40mg'$  once daily after that  Await pathology results   YOU HAD AN ENDOSCOPIC PROCEDURE TODAY AT [facility]:   Refer to the procedure report that was given to you for any specific questions about what was found during the examination.  If the procedure report does not answer your questions, please call your gastroenterologist to clarify.  If you requested that your care partner not be given the details of your procedure findings, then the procedure report has been included in a sealed envelope for you to review at your convenience later.  YOU SHOULD EXPECT: Some feelings of bloating in the abdomen. Passage of more gas than usual.  Walking can help get rid of the air that was put into your GI tract during the procedure and reduce the bloating. If you had a lower endoscopy (such as a colonoscopy or flexible sigmoidoscopy) you may notice spotting of blood in your stool or on the toilet paper. If you underwent a bowel prep for your procedure, you may not have a normal bowel movement for a few days.  Please Note:  You might notice some irritation and congestion in your nose or some drainage.  This is from the oxygen used during your procedure.  There is no need for concern and it should clear up in a day or so.  SYMPTOMS TO REPORT IMMEDIATELY:  Following upper endoscopy (EGD)  Vomiting of blood or coffee ground material  New chest pain or pain under the shoulder blades  Painful or persistently difficult swallowing  New shortness of breath  Fever of 100F or higher  Black, tarry-looking stools  For urgent or emergent issues, a gastroenterologist can be reached at any hour by calling [phone]. Do not use MyChart messaging for urgent concerns.    DIET:  We do recommend a small meal at first, but then you may proceed to your regular diet.  Drink plenty of fluids but you should avoid alcoholic beverages for 24  hours.  ACTIVITY:  You should plan to take it easy for the rest of today and you should NOT DRIVE or use heavy machinery until tomorrow (because of the sedation medicines used during the test).    FOLLOW UP: Our staff will call the number listed on your records the next business day following your procedure.  We will call around 7:15- 8:00 am to check on you and address any questions or concerns that you may have regarding the information given to you following your procedure. If we do not reach you, we will leave a message.     If any biopsies were taken you will be contacted by phone or by letter within the next 1-3 weeks.  Please call us at [phone] if you have not heard about the biopsies in 3 weeks.    SIGNATURES/CONFIDENTIALITY: You and/or your care partner have signed paperwork which will be entered into your electronic medical record.  These signatures attest to the fact that that the information above on your After Visit Summary has been reviewed and is understood.  Full responsibility of the confidentiality of this discharge information lies with you and/or your care-partner.

## 2022-09-27 ENCOUNTER — Telehealth: Payer: Self-pay | Admitting: *Deleted

## 2022-09-27 ENCOUNTER — Encounter (HOSPITAL_COMMUNITY): Payer: Self-pay | Admitting: Internal Medicine

## 2022-09-27 NOTE — Telephone Encounter (Signed)
  Follow up Call-     09/26/2022    9:30 AM  Call back number  Post procedure Call Back phone  # [phone]  Permission to leave phone message Yes     Patient questions:  Do you have a fever, pain , or abdominal swelling? No. Pain Score  0 *  Have you tolerated food without any problems? Yes.    Have you been able to return to your normal activities? Yes.    Do you have any questions about your discharge instructions: Diet   No. Medications  No. Follow up visit  No.  Do you have questions or concerns about your Care? No.  Actions: * If pain score is 4 or above: No action needed, pain <4.

## 2022-09-30 ENCOUNTER — Ambulatory Visit: Payer: Medicare Other

## 2022-09-30 NOTE — Progress Notes (Signed)
Made aware of patient's allergy to lidocaine and epi for local anesthetic. Called pharmacy and spoke with pharmacist Justice Deeds. Pharmacist advises [facility] does not have prilocaine/mepivacaine combination but does carry 1.5%-2% mepivacaine that will be effective.

## 2022-09-30 NOTE — Pre-Procedure Instructions (Signed)
Notified Patient that pharmacy says we can use mepivacaine for her numbing medication.  Patient verbalized understanding.

## 2022-10-01 ENCOUNTER — Encounter (HOSPITAL_COMMUNITY): Payer: Self-pay | Admitting: Internal Medicine

## 2022-10-01 ENCOUNTER — Encounter (HOSPITAL_COMMUNITY)
Admission: RE | Disposition: A | Discharge status: Home / Self Care | Payer: Self-pay | Source: Home / Self Care | Attending: Internal Medicine

## 2022-10-01 ENCOUNTER — Ambulatory Visit (HOSPITAL_COMMUNITY)
Admission: RE | Admit: 2022-10-01 | Discharge: 2022-10-01 | Disposition: A | Discharge status: Home / Self Care | Payer: Medicare Other | Attending: Internal Medicine | Admitting: Internal Medicine

## 2022-10-01 ENCOUNTER — Other Ambulatory Visit: Payer: Self-pay

## 2022-10-01 DIAGNOSIS — Z79899 Other long term (current) drug therapy: Secondary | ICD-10-CM | POA: Insufficient documentation

## 2022-10-01 DIAGNOSIS — M349 Systemic sclerosis, unspecified: Secondary | ICD-10-CM | POA: Insufficient documentation

## 2022-10-01 DIAGNOSIS — Z853 Personal history of malignant neoplasm of breast: Secondary | ICD-10-CM | POA: Insufficient documentation

## 2022-10-01 DIAGNOSIS — I272 Pulmonary hypertension, unspecified: Secondary | ICD-10-CM

## 2022-10-01 DIAGNOSIS — J849 Interstitial pulmonary disease, unspecified: Secondary | ICD-10-CM | POA: Diagnosis not present

## 2022-10-01 DIAGNOSIS — I2721 Secondary pulmonary arterial hypertension: Secondary | ICD-10-CM | POA: Diagnosis not present

## 2022-10-01 HISTORY — PX: RIGHT HEART CATH: CATH118263

## 2022-10-01 LAB — POCT I-STAT EG7
Acid-base deficit: 4 mmol/L — ABNORMAL HIGH (ref 0.0–2.0)
Acid-base deficit: 4 mmol/L — ABNORMAL HIGH (ref 0.0–2.0)
Acid-base deficit: 5 mmol/L — ABNORMAL HIGH (ref 0.0–2.0)
Bicarbonate: 19 mmol/L — ABNORMAL LOW (ref 20.0–28.0)
Bicarbonate: 20.3 mmol/L (ref 20.0–28.0)
Bicarbonate: 20.6 mmol/L (ref 20.0–28.0)
Calcium, Ion: 1.23 mmol/L (ref 1.15–1.40)
Calcium, Ion: 1.27 mmol/L (ref 1.15–1.40)
Calcium, Ion: 1.29 mmol/L (ref 1.15–1.40)
HCT: 30 % — ABNORMAL LOW (ref 36.0–46.0)
HCT: 30 % — ABNORMAL LOW (ref 36.0–46.0)
HCT: 30 % — ABNORMAL LOW (ref 36.0–46.0)
Hemoglobin: 10.2 g/dL — ABNORMAL LOW (ref 12.0–15.0)
Hemoglobin: 10.2 g/dL — ABNORMAL LOW (ref 12.0–15.0)
Hemoglobin: 10.2 g/dL — ABNORMAL LOW (ref 12.0–15.0)
O2 Saturation: 74 %
O2 Saturation: 75 %
O2 Saturation: 77 %
Potassium: 3.9 mmol/L (ref 3.5–5.1)
Potassium: 4 mmol/L (ref 3.5–5.1)
Potassium: 4.1 mmol/L (ref 3.5–5.1)
Sodium: 135 mmol/L (ref 135–145)
Sodium: 135 mmol/L (ref 135–145)
Sodium: 136 mmol/L (ref 135–145)
TCO2: 20 mmol/L — ABNORMAL LOW (ref 22–32)
TCO2: 21 mmol/L — ABNORMAL LOW (ref 22–32)
TCO2: 22 mmol/L (ref 22–32)
pCO2, Ven: 31.9 mmHg — ABNORMAL LOW (ref 44–60)
pCO2, Ven: 34.2 mmHg — ABNORMAL LOW (ref 44–60)
pCO2, Ven: 34.3 mmHg — ABNORMAL LOW (ref 44–60)
pH, Ven: 7.382 (ref 7.25–7.43)
pH, Ven: 7.382 (ref 7.25–7.43)
pH, Ven: 7.386 (ref 7.25–7.43)
pO2, Ven: 39 mmHg (ref 32–45)
pO2, Ven: 40 mmHg (ref 32–45)
pO2, Ven: 42 mmHg (ref 32–45)

## 2022-10-01 SURGERY — RIGHT HEART CATH
Anesthesia: LOCAL

## 2022-10-01 MED ORDER — SODIUM CHLORIDE 0.9% FLUSH: 3.0000 mL | INTRAVENOUS | Status: DC | PRN

## 2022-10-01 MED ORDER — HYDRALAZINE HCL 20 MG/ML IJ SOLN: 10.0000 mg | INTRAMUSCULAR | Status: DC | PRN

## 2022-10-01 MED ORDER — ONDANSETRON HCL 4 MG/2ML IJ SOLN: 4.0000 mg | Freq: Four times a day (QID) | INTRAMUSCULAR | Status: DC | PRN

## 2022-10-01 MED ORDER — SODIUM CHLORIDE 0.9 % IV SOLN: 250.0000 mL | INTRAVENOUS | Status: DC | PRN

## 2022-10-01 MED ORDER — ACETAMINOPHEN 325 MG PO TABS: 650.0000 mg | ORAL_TABLET | ORAL | Status: DC | PRN

## 2022-10-01 MED ORDER — MEPIVACAINE HCL (PF) 2 % IJ SOLN
5.0000 mL | Freq: Once | INTRAMUSCULAR | Status: DC
  Filled 2022-10-01: qty 20

## 2022-10-01 MED ORDER — HEPARIN (PORCINE) IN NACL 1000-0.9 UT/500ML-% IV SOLN
INTRAVENOUS | Status: DC | PRN
  Administered 2022-10-01: 500 mL

## 2022-10-01 MED ORDER — MEPIVACAINE HCL (PF) 2 % IJ SOLN
INTRAMUSCULAR | Status: DC | PRN
  Administered 2022-10-01: 5 mL

## 2022-10-01 MED ORDER — SODIUM CHLORIDE 0.9 % IV SOLN: INTRAVENOUS | Status: DC

## 2022-10-01 MED ORDER — SODIUM CHLORIDE 0.9% FLUSH: 3.0000 mL | Freq: Two times a day (BID) | INTRAVENOUS | Status: DC

## 2022-10-01 MED ORDER — LABETALOL HCL 5 MG/ML IV SOLN: 10.0000 mg | INTRAVENOUS | Status: DC | PRN

## 2022-10-01 SURGICAL SUPPLY — 7 items
CATH BALLN WEDGE 5F 110CM (CATHETERS) IMPLANT
GUIDEWIRE .025 260CM (WIRE) IMPLANT
PACK CARDIAC CATHETERIZATION (CUSTOM PROCEDURE TRAY) ×1 IMPLANT
SHEATH GLIDE SLENDER 4/5FR (SHEATH) IMPLANT
TRANSDUCER W/STOPCOCK (MISCELLANEOUS) ×1 IMPLANT
TUBING ART PRESS 72  MALE/FEM (TUBING) ×1
TUBING ART PRESS 72 MALE/FEM (TUBING) IMPLANT

## 2022-10-01 NOTE — Interval H&P Note (Signed)
History and Physical Interval Note:  10/01/2022 7:28 AM  [name]  has presented today for surgery, with the diagnosis of hp.  The various methods of treatment have been discussed with the patient and family. After consideration of risks, benefits and other options for treatment, the patient has consented to  Procedure(s): RIGHT HEART CATH (N/A) as a surgical intervention.  The patient's history has been reviewed, patient examined, no change in status, stable for surgery.  I have reviewed the patient's chart and labs.  Questions were answered to the patient's satisfaction.     [name]

## 2022-10-01 NOTE — Discharge Instructions (Signed)

## 2022-10-02 DIAGNOSIS — R208 Other disturbances of skin sensation: Secondary | ICD-10-CM | POA: Diagnosis not present

## 2022-10-02 DIAGNOSIS — M3489 Other systemic sclerosis: Secondary | ICD-10-CM | POA: Diagnosis not present

## 2022-10-02 DIAGNOSIS — M25642 Stiffness of left hand, not elsewhere classified: Secondary | ICD-10-CM | POA: Diagnosis not present

## 2022-10-02 DIAGNOSIS — R600 Localized edema: Secondary | ICD-10-CM | POA: Diagnosis not present

## 2022-10-02 DIAGNOSIS — M25641 Stiffness of right hand, not elsewhere classified: Secondary | ICD-10-CM | POA: Diagnosis not present

## 2022-10-02 DIAGNOSIS — M79642 Pain in left hand: Secondary | ICD-10-CM | POA: Diagnosis not present

## 2022-10-02 DIAGNOSIS — M79641 Pain in right hand: Secondary | ICD-10-CM | POA: Diagnosis not present

## 2022-10-04 ENCOUNTER — Encounter: Payer: Self-pay | Admitting: Family Medicine

## 2022-10-04 ENCOUNTER — Encounter (HOSPITAL_COMMUNITY): Payer: Self-pay | Admitting: Internal Medicine

## 2022-10-04 DIAGNOSIS — E039 Hypothyroidism, unspecified: Secondary | ICD-10-CM

## 2022-10-07 ENCOUNTER — Ambulatory Visit: Payer: Medicare Other

## 2022-10-07 ENCOUNTER — Telehealth: Payer: Self-pay | Admitting: Internal Medicine

## 2022-10-07 LAB — CARDIO IQ® NT PROBNP: NT PROBNP: 182 pg/mL — ABNORMAL HIGH (ref <125)

## 2022-10-07 NOTE — Telephone Encounter (Signed)
Pt is wanting to get in contact with Dr. Chase Caller in regards to some medication challenges. Pt dropped off some Paperwork for Dr. Chase Caller to review

## 2022-10-08 ENCOUNTER — Encounter: Payer: Self-pay | Admitting: Gastroenterology

## 2022-10-08 NOTE — Telephone Encounter (Signed)
Spoke with the pt She has been having some decreased appetite, dry cough and wt loss over the past couple months  She feels like these symptoms could be coming from valtrex and or lisinopril  She states she is aware that both meds can contribute to a cough She stopped Valtrex 3 days ago and cough seems better  She is wanting to gather info from all of her physicians on what they think about this  Dr Guadalupe Dawn, please advise, thanks!

## 2022-10-09 ENCOUNTER — Other Ambulatory Visit (INDEPENDENT_AMBULATORY_CARE_PROVIDER_SITE_OTHER): Payer: Medicare Other

## 2022-10-09 DIAGNOSIS — E039 Hypothyroidism, unspecified: Secondary | ICD-10-CM

## 2022-10-09 DIAGNOSIS — M349 Systemic sclerosis, unspecified: Secondary | ICD-10-CM | POA: Diagnosis not present

## 2022-10-09 LAB — CBC
HCT: 35.6 % — ABNORMAL LOW (ref 36.0–46.0)
Hemoglobin: 11.8 g/dL — ABNORMAL LOW (ref 12.0–15.0)
MCHC: 33.1 g/dL (ref 30.0–36.0)
MCV: 92.4 fl (ref 78.0–100.0)
Platelets: 345 10*3/uL (ref 150.0–400.0)
RBC: 3.85 Mil/uL — ABNORMAL LOW (ref 3.87–5.11)
RDW: 20.1 % — ABNORMAL HIGH (ref 11.5–15.5)
WBC: 8 10*3/uL (ref 4.0–10.5)

## 2022-10-09 LAB — COMPREHENSIVE METABOLIC PANEL
ALT: 24 U/L (ref 0–35)
AST: 49 U/L — ABNORMAL HIGH (ref 0–37)
Albumin: 4.3 g/dL (ref 3.5–5.2)
Alkaline Phosphatase: 66 U/L (ref 39–117)
BUN: 15 mg/dL (ref 6–23)
CO2: 23 mEq/L (ref 19–32)
Calcium: 9.7 mg/dL (ref 8.4–10.5)
Chloride: 100 mEq/L (ref 96–112)
Creatinine, Ser: 1.39 mg/dL — ABNORMAL HIGH (ref 0.40–1.20)
GFR: 37.66 mL/min — ABNORMAL LOW (ref >60.00)
Glucose, Bld: 78 mg/dL (ref 70–99)
Potassium: 4.5 mEq/L (ref 3.5–5.1)
Sodium: 134 mEq/L — ABNORMAL LOW (ref 135–145)
Total Bilirubin: 0.4 mg/dL (ref 0.2–1.2)
Total Protein: 7.1 g/dL (ref 6.0–8.3)

## 2022-10-09 LAB — CK: Total CK: 575 U/L — ABNORMAL HIGH (ref 7–177)

## 2022-10-09 LAB — TSH: TSH: 1.33 u[IU]/mL (ref 0.35–5.50)

## 2022-10-09 NOTE — Telephone Encounter (Signed)
Left message for patient to call back  

## 2022-10-09 NOTE — Telephone Encounter (Signed)
Probalby the lisinopril - can she talk to renal and see if they are willing to stop this. 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 I can sign the papers 10/10/22 - LMK

## 2022-10-09 NOTE — Progress Notes (Signed)
Lab on 

## 2022-10-10 ENCOUNTER — Encounter: Payer: Self-pay | Admitting: Family Medicine

## 2022-10-13 LAB — CARDIO IQ® NT PROBNP: NT PROBNP: 251 pg/mL — ABNORMAL HIGH (ref <125)

## 2022-10-14 ENCOUNTER — Telehealth: Payer: Self-pay | Admitting: Family Medicine

## 2022-10-14 NOTE — Telephone Encounter (Signed)
Copied from [place] [id number]. Topic: Medicare AWV >> Oct 14, 2022 11:01 AM Devoria Glassing wrote: Reason for CRM: Called patient to schedule Medicare Annual Wellness Visit (AWV). Left message for patient to call back and schedule Medicare Annual Wellness Visit (AWV).  Last date of AWV: NONE  Please schedule an appointment at any time with Beatris Ship, CMA  .  If any questions, please contact me.  Thank you ,  Sherol Dade; [place] Direct Dial: [phone]

## 2022-10-15 NOTE — Telephone Encounter (Signed)
Got letter from 10/15/2022 - can she wait till [place] 10/24/22 to address those

## 2022-10-16 DIAGNOSIS — M79642 Pain in left hand: Secondary | ICD-10-CM | POA: Diagnosis not present

## 2022-10-16 DIAGNOSIS — M79641 Pain in right hand: Secondary | ICD-10-CM | POA: Diagnosis not present

## 2022-10-16 DIAGNOSIS — M349 Systemic sclerosis, unspecified: Secondary | ICD-10-CM | POA: Diagnosis not present

## 2022-10-16 DIAGNOSIS — M25642 Stiffness of left hand, not elsewhere classified: Secondary | ICD-10-CM | POA: Diagnosis not present

## 2022-10-16 DIAGNOSIS — M3489 Other systemic sclerosis: Secondary | ICD-10-CM | POA: Diagnosis not present

## 2022-10-16 DIAGNOSIS — M25641 Stiffness of right hand, not elsewhere classified: Secondary | ICD-10-CM | POA: Diagnosis not present

## 2022-10-17 ENCOUNTER — Encounter (HOSPITAL_COMMUNITY): Payer: Self-pay | Admitting: Internal Medicine

## 2022-10-23 ENCOUNTER — Other Ambulatory Visit: Payer: Self-pay

## 2022-10-23 DIAGNOSIS — N280 Ischemia and infarction of kidney: Secondary | ICD-10-CM

## 2022-10-23 DIAGNOSIS — N1831 Chronic kidney disease, stage 3a: Secondary | ICD-10-CM | POA: Diagnosis not present

## 2022-10-23 DIAGNOSIS — J849 Interstitial pulmonary disease, unspecified: Secondary | ICD-10-CM

## 2022-10-23 DIAGNOSIS — M349 Systemic sclerosis, unspecified: Secondary | ICD-10-CM | POA: Diagnosis not present

## 2022-10-23 DIAGNOSIS — D631 Anemia in chronic kidney disease: Secondary | ICD-10-CM | POA: Diagnosis not present

## 2022-10-23 DIAGNOSIS — I129 Hypertensive chronic kidney disease with stage 1 through stage 4 chronic kidney disease, or unspecified chronic kidney disease: Secondary | ICD-10-CM | POA: Diagnosis not present

## 2022-10-24 ENCOUNTER — Ambulatory Visit (INDEPENDENT_AMBULATORY_CARE_PROVIDER_SITE_OTHER): Payer: Medicare Other | Admitting: Internal Medicine

## 2022-10-24 ENCOUNTER — Encounter: Payer: Self-pay | Admitting: Internal Medicine

## 2022-10-24 VITALS — BP 108/60 | HR 86 | Temp 98.8°F | Ht 64.5 in | Wt 141.8 lb

## 2022-10-24 DIAGNOSIS — R0600 Dyspnea, unspecified: Secondary | ICD-10-CM

## 2022-10-24 DIAGNOSIS — J849 Interstitial pulmonary disease, unspecified: Secondary | ICD-10-CM | POA: Diagnosis not present

## 2022-10-24 DIAGNOSIS — J986 Disorders of diaphragm: Secondary | ICD-10-CM | POA: Diagnosis not present

## 2022-10-24 DIAGNOSIS — Z5181 Encounter for therapeutic drug level monitoring: Secondary | ICD-10-CM

## 2022-10-24 LAB — PULMONARY FUNCTION TEST
DL/VA % pred: 81 %
DL/VA: 3.35 ml/min/mmHg/L
DLCO cor % pred: 47 %
DLCO cor: 9.27 ml/min/mmHg
DLCO unc % pred: 44 %
DLCO unc: 8.78 ml/min/mmHg
FEF 25-75 Pre: 1.74 L/sec
FEF2575-%Pred-Pre: 97 %
FEV1-%Pred-Pre: 65 %
FEV1-Pre: 1.47 L
FEV1FVC-%Pred-Pre: 112 %
FEV6-%Pred-Pre: 61 %
FEV6-Pre: 1.73 L
FEV6FVC-%Pred-Pre: 104 %
FVC-%Pred-Pre: 58 %
FVC-Pre: 1.73 L
Pre FEV1/FVC ratio: 85 %
Pre FEV6/FVC Ratio: 100 %

## 2022-10-24 NOTE — Progress Notes (Unsigned)
08/27/22 [facility] Maryln [facility]   IMPRESSION: 1. Interstitial lung disease/fibrosis. Nonspecific patchy bilateral groundglass opacities. 2. Moderate pericardial effusion. 3. Small left pleural effusion, trace right pleural effusion. 4. Mediastinal adenopathy.  Assessment and Plan Ms. Clouatre is a 72 y.o. 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 The patient also has a history of left breast cancer. Since her last visit, she has evidence of increased SSc disease activity as manifested by skin thickening, oropharyngeal dysphagia, tendon friction rubs and scleroderma renal crisis. Her course has also been complicated by worsening lower extremity edema. It is unclear what is driving her worsening disease activity, though notably she had been off of MMF for a period of time prior to the recurrence of her symptoms. Notably, she has an elevated CA 27.29 in the past - which makes one wonder about subclinical malignancy driving her symptoms.  Cutaneous: Skin score today was 17, a significant increase from her score of 3 at our last visit; we note that some of this is confounded by her lower extremity edema. However, despite this, we are in agreement that her skin score has worsened. She has been restarted on mycophenolate mofetil, and is currently on 1000 mg BID. We discussed that given her renal function we would not increase this further. She does have a history of difficult to treat shingles (lasted 9 months) when she previously was on MMF. She has been placed on valacyclovir prophylaxis. We discussed that it would likely take a few months to see the full effect of this medication. - Continue MMF 1000 mg BID - Monitoring labs with local rheumatologist  Raynaud's phenomenon: Her symptoms are stable.  She is on amlodipine for blood pressure management which may have additional benefit for her RP.  Cardiopulmonary: CT scan demonstrated bilateral ground glass opacities; however, these are non-specific in the setting of her volume overload. In addition, her PFTs are difficult to interpret in the setting of her known volume overload as well. Once she is euvolemic, these tests should be repeated. She is also planned to see Dr. Aurther Loft in pulmonary in our Center tomorrow.  Gastrointestinal: She has GERD which is stable on pantoprazole 40 mg daily. Barium swallow demonstrated cricopharyngeal bar. She is planned for endoscopy next week to further evaluate her oropharyngeal dysphagia.  MSK: She has evidence of tendon friction rubs on exam, though she denies significant arthralgias. No evidence of inflammatory arthritis. She notes generalized weakness in the setting of her recent hospitalization but her strength is intact on exam.  Renal: She recently was hospitalized in the setting of scleroderma renal crisis. Her blood pressure today was stable at 122/68, and she has been monitoring her blood pressure closely at home. She is currently managed with lisinopril 40 mg and amlodipine 10 mg . She does not currently have a nephrologist and we strongly recommended that she establish care with one. We will reach out to Dr. Sherryll Burger at [facility] to help coordinate care.  Patient was evaluated with Dr. Sherryll Burger after which the above assessment and plan was formulated. Please see attending attestation for further details.    08/28/2022 Dr. Mammie Russian -[facility] interstitial lung disease center   Rheumatologist: Dr. Sherryll Burger Primary pulmonologist, if applicable: Seen previously in the [facility] ILD center by Dr. Gaynell Face  [id number] Kalish is a 72 y.o. female with a PMH of  SSc and ILD who presents for an initial evaluation regarding dyspnea. She is followed by Dr. Sherryll Burger and was first diagnosed with scleroderma in 2017  based on a high titer ANA and skin thickening. She has been followed previously in the [facility] ILD center, with her most recent office note being in 2020. In terms of her respiratory history, she had a dry cough that developed in 2018 and had a chest CT done that year which showed bibasilar interstitial infiltrates consistent with an NSIP pattern. She was started on CellCept in December 2018 with significant improvement in [facility] and diffusion capacity. Her CellCept was stopped in January 2023 due to prolonged episode of shingles.  She was recently admitted to [facility] for scleroderma renal crisis. By the time of discharge, she had improvement but not normalization of her creatinine. Blood pressure was better controlled. Mycophenolate was added back due to a worsening of her skin disease (now on 1000mg  BID). Since she was discharged from the hospital, she feels like she is slowly improving but continues to have increased edema.  Was able to walk up and down stairs easily in October, when she had an anniversary party  Since her hospitalization, she has had trouble walking up her steps.   Impression: Ms. Herbold is a 39 y.o. 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 She had signs of mild interstitial lung disease on the CAT scan report from 2018. She was started on CellCept at that point and had a significant improvement in the forced vital capacity. Overall, she feels like her breathing has been worse since July 2023. She recently was discharged from [facility] with scleroderma renal crisis. Her creatinine continues to improve, but she still exhibits signs of volume overload both on exam and imaging with a moderate pericardial effusion and small bilateral pleural effusions. Her breathing symptoms have been worse in the context and her pulmonary function tests this week are challenging to interpret with her acute recent hospitalization. However,  she did have PFTs done at [facility] in October, which was prior to her hospitalization that showed a pattern of worsening in terms of her FVC and DLCO. So it clearly appears that she had progressive ILD even before her scleroderma renal crisis. She started back on CellCept 1000 mg twice per day. Dr. Maryln Manuel) Sherryll Burger would like her to stay on this dose rather than increase it to 1500 mg twice per day because of her kidney function.  Plan: -Continue CellCept. Agree with the current dosing, but as her creatinine improves I would prefer to get her up to 1500 mg twice per day -Repeat PFTs in 3 months - I note that her CK and inflammatory markers are elevated. This is likely in the context of her renal crisis. I will talk to Dr. Sherryll Burger about IVIG, although the amount of volume would not be well-tolerated at this point. -Continue furosemide through the weekend. Her rheumatologist at [facility] is working on getting her a sooner appointment with a nephrologist, who will be directing her diuretic therapy. For now I would suggest getting weekly creatinine values until she can see nephrology   OV 09/05/2022 - new consult = referred by Dr Shan Levans  Subjective:  Patient ID: [name], female , DOB: May 14, 1951 , age 48 y.o. , MRN: [id number] , ADDRESS: [address] [place] [address] PCP Copland, Gwenlyn Found, MD Patient Care Team: Copland, Gwenlyn Found, MD as PCP - General (Family Medicine)  Glendale Chard, DO as Consulting Physician (Neurology) Delsa Grana, MD Tami Ribas Gae Gallop, MD as Referring Physician (Rheumatology) Merrilyn Puma, MD as Referring Physician (Pulmonary Disease) Jerene Bears, MD as Consulting Physician (Gynecology) Durenda Age, MD as Referring Physician (Internal Medicine) Donzetta Starch, MD as Consulting Physician (Dermatology) Jerene Bears, MD as Consulting Physician (Gynecology) Pyrtle, Carie Caddy, MD as Consulting Physician (Gastroenterology) Myna Hidalgo Rose Phi, MD as Medical  Oncologist (Oncology)  This Provider for this visit: Treatment Team:  Attending Provider: Kalman Shan, MD    09/05/2022 -   Chief Complaint  Patient presents with   Consult    Scleroderma      HPI KINLIE JANICE 72 y.o. -history is from the patient, husband acting as independent historian, detailed review of the external medical records [address] and also admissions and also [facility].  The pertinent details from [facility] visit with both rheumatology and pulmonary is detailed above.  Patient is personal friend of Adriana Simas who is known to me.  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  She is being followed at [facility].  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  But pulmonary function test has been stable all the way through 2022.  During this time she was maintained on CellCept through Dr.[employer]  Then in early 2023 developed shingles around the left ear and the neck.  Husband did show photographs of this.  During this time CellCept was making the shingles record and poorly healing.  Therefore CellCept had to be stopped.  She was pretty much off CellCept through the course of 2023.  During this time as of October 2023 her pulmonary function decline compared to 2022 but she was actually unaware of this.  Then abruptly in January 2024 she ended up with hypertension and prior to that was having worsening pedal edema].  She was admitted for renal crisis at [facility].  Her baseline creatinine was 1.1 mg percent [November 2023] but on 08/09/2022 it was 1.8 mg percent.  The following day on 08/10/2022 she was transferred to [facility] because of complex medical condition.  She was  started on CellCept there she spent 6 days then then discharge.  During this time because of concern of shingles recommend she is being maintained on daily acyclovir.  She is also on amlodipine and lisinopril.  She is continuing this.  She is also on diuresis.  She is continuing this.  She says she is diuresed well.  Currently blood pressures have improved significantly between 109 - 128 systolic.  Of note while at [facility] health she did have CT scan of the chest [I personally visualized today] she actually has ILD although radiologist does not mention this.  She has pericardial effusion both on the echo at [place] [facility] and the CT scan.  This was absent even as late as October 2023.  She followed up with rheumatology and pulmonary at [facility] both Dr. Martyn Malay [rheumatology] and also Dr. Anthoney Harada in pulmonary.  Their notes are documented below but the consensus that she actually has ILD and is actually progressive even before the renal crisis.  The pulmonary function test after renal crisis shows further progression although this could be clouded by all the  volume overload issues.  The supporting CellCept.  It appears nintedanib was not discussed with the patient.  She does not recollect this.  They are concerned about her dysphagia and I recommended endoscopy but given the pericardial effusion want to pulmonary hypertension/cardiac issues stabilized before undergoing any endoscopy there also recommended weekly creatinine.  Her latest creatinine yesterday was 1.9 mg percent.  [Peak creatinine 2.03 mg percent 08/22/2022).  She is trying to get an appointment with nephrology.  I personally spoke to Dr. Jomarie Longs: None ordered today over the phone.  Of note she has never had a right heart catheterization. It appears she has not seen [facility] pulmonary/ILD     Past Medical History :   In terms of her breast cancer, she had a left sided lumpectomy and radiation. She is being followed for a possible lesion on her right  breast, but recent testing has been negative (other than biomarkers)  SSc disease characteristics from [facility] Cutaneous subtype: Diffuse Autoantibody profile: RNA pol III Pulmonary involvement: ILD Cardiac involvement: Pericardial effusion GI involvement: Dysphagia Skin/vascular: Raynaud's, Telangiectases, and Sclerodactyly  She did get radiation for breast cancer.   ROS:  -Positive for scleroderma with Raynaud's.  It appears the skin score is worsened -Immunosuppressed status - Shingles in 2023 - Dysphagia - Fatigue - Dry eyes  FAMILY HISTORY of LUNG DISEASE:  -Negative  PERSONAL EXPOSURE HISTORY:  -Denies any smoking or marijuana or vaping or intravenous drug use.  HOME  EXPOSURE and HOBBY DETAILS :  [place]  [place] is lived there for 17 years.  The age of the home is 17 years.  Detail organic and inorganic antigen history is negative.  She is retired.  She is involved with the [facility].  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  Detail organic and inorganic antigen exposure history is negative.  PULMONARY TOXICITY HISTORY (27 items):  X-radiation for breast cancer  INVESTIGATIONS: -GFR of 25.9 with an AST of 43 and a creatinine 1.9 mg percent 09/04/2022.  HRCT 05/20/2017  - at [facility] Per report   1.  Mild lower lobe and peripheral predominant reticulations, minimal traction bronchiectasis, and ground glass opacities with no honeycombing. Findings representing probable UIP pattern as per Fleischner criteria. 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 2.  Interval development of small bilateral pleural effusions and a small pericardial effusion. 3.  Interval increase in anterior left lower lobe pleural thickening and subpleural reticulation suggestive of post radiation changes. Is only seen on prior examination. 4.  There is a  nonspecific 2 mm nodule in the right upper lobe which is not seen on prior examination likely due to different technique. Attention on follow-up examination as per clinical protocol is recommended.  Electronically Signed by:  Glo Herring, MD, [facility] Electronically Signed on:  05/20/2017 2:04 PM Procedure Note  Glo Herring, MD - 05/20/2017 Formatting of this note might be different from the original. Chest CT  Indication: Interstitial lung disease, Scleroderma involving lung , M34.9 Systemic sclerosis, unspecified (CMS-HCC).  Comparison: January 21, 2017   CT Chest data wo contrst Jan 2024 -personally visualized.  My personal interpretation is that there is ILD.  ILD is also been reported in the subsequent high-resolution CT chest at [facility]  Narrative & Impression  CLINICAL DATA:  Shortness of breath.  History of breast cancer.   EXAM: CT CHEST WITHOUT CONTRAST   TECHNIQUE: Multidetector  CT imaging of the chest was performed following the standard protocol without IV contrast.   RADIATION DOSE REDUCTION: This exam was performed according to the departmental dose-optimization program which includes automated exposure control, adjustment of the mA and/or kV according to patient size and/or use of iterative reconstruction technique.   COMPARISON:  August 09, 2022.  February 28, 2022.   FINDINGS: Cardiovascular: No evidence of thoracic aortic aneurysm. Mild cardiomegaly is noted. Mild to moderate pericardial effusion is noted.   Mediastinum/Nodes: No enlarged mediastinal or axillary lymph nodes. Thyroid gland, trachea, and esophagus demonstrate no significant findings.   Lungs/Pleura: Small bilateral pleural effusions are noted. Minimal bibasilar pulmonary edema or subsegmental atelectasis is noted. No pneumothorax is noted.   Upper Abdomen: No acute abnormality.   Musculoskeletal: Stable old left rib fractures are noted. No acute osseous abnormality is  noted.   IMPRESSION: Mild to moderate size pericardial effusion is now noted.   Small bilateral pleural effusions are noted with associated minimal bibasilar pulmonary edema or subsegmental atelectasis.     Electronically Signed   By: Lupita Raider M.D.   On: 08/10/2022 14:47      CT CHEST HRCT 08/26/22 at [facility] -this was done at [facility].  I personally could not visualize this because the upload system would not work.  IMPRESSION: 1.  Interstitial lung disease/fibrosis. Nonspecific patchy bilateral groundglass opacities. 2.  Moderate pericardial effusion. 3.  Small left pleural effusion, trace right pleural effusion. 4.  Mediastinal adenopathy.  Images and interpretation personally reviewed by: Eula Listen, MD Exam End: 08/26/22 16:43   Specimen Collected: 08/26/22 16:45 Last Resulted: 08/26/22 16:49  Received From: [facility]    ECHO 08/10/22 (no effusion in Oct 2023)   Sonographer Comments: Image acquisition challenging due to breast  implants.  IMPRESSIONS     1. Left ventricular ejection fraction, by estimation, is 60 to 65%. The  left ventricle has normal function. The left ventricle has no regional  wall motion abnormalities. There is mild left ventricular hypertrophy of  the basal-septal segment. Left  ventricular diastolic parameters are consistent with Grade I diastolic  dysfunction (impaired relaxation).   2. Right ventricular systolic function is normal. The right ventricular  size is normal.   3. Moderate pericardial effusion. There is no evidence of cardiac  tamponade.   4. The mitral valve is normal in structure. Trivial mitral valve  regurgitation. No evidence of mitral stenosis.   5. The aortic valve is tricuspid. Aortic valve regurgitation is trivial.  No aortic stenosis is present.   6. The inferior vena cava is normal in size with greater than 50%  respiratory variability, suggesting right atrial pressure of 3 mmHg.    Comparison(s): No prior Echocardiogram.    OV 10/24/2022  Subjective:  Patient ID: [name], female , DOB: March 13, 1951 , age 18 y.o. , MRN: [id number] , ADDRESS: [address] [place] [address] PCP Copland, Gwenlyn Found, MD Patient Care Team: Copland, Gwenlyn Found, MD as PCP - General (Family Medicine) Glendale Chard, DO as Consulting Physician (Neurology) Delsa Grana, MD Ancil Boozer, MD as Referring Physician (Rheumatology) Merrilyn Puma, MD as Referring Physician (Pulmonary Disease) Jerene Bears, MD as Consulting Physician (Gynecology) Durenda Age, MD as Referring Physician (Internal Medicine) Donzetta Starch, MD as Consulting Physician (Dermatology) Jerene Bears, MD as Consulting Physician (Gynecology) Pyrtle, Carie Caddy, MD as Consulting Physician (Gastroenterology) Myna Hidalgo Rose Phi, MD as Medical Oncologist (Oncology)  This Provider for  this visit: Treatment Team:  Attending Provider: Kalman Shanamaswamy, Chelly Dombeck, MD    10/24/2022 -   Chief Complaint  Patient presents with   Follow-up    Review PFT from today   #Scleroderma systemic # ILD associated with scleroderma workup and evaluation progress # Scleroderma renal and respiratory crisis late 2023/early 2024 following hold on CellCept due to shingles  HPI Cain SieveDonna R Hoben 72 y.o. -returns for follow-up.  She presents with her husband.  Husband is an independent historian today.  The following is and amalgamation of her information and review of the chart and also the husband.  I saw her 2 months ago.  Several issues at this visit  -Renal: She has now established with our local nephrology practice with Dr. Arrie Aranoladonato.  She saw his nurse practitioner yesterday.  Apparently her creatinine has improved.  Most recent one end of March 2024 was 1.39 mg percent.  She believes it is improved because she is back on CellCept for her scleroderma and also she is on antihypertensives  -Blood pressure: She tried  Norvasc 2.5 mg along with lisinopril.  She felt the combination made her hypotensive.  She attributed most of the drop to the 2.5 mg Norvasc.  But even on full dose lisinopril 40 mg/day her blood pressure was still on the lower side.  She says she gets fatigued with the blood pressure is less than 108 systolic and does better when the blood pressure is over 118 systolic.  Therefore she is reduced her lisinopril to 20 mg/day and with this the blood pressure is in a better range and she is feeling better  -New issue "swollen tongue: She has been noticing that her tongue has been swollen for the last few weeks.  This is definitely after starting lisinopril but she is not associating this with lisinopril.  There is no angioedema of the lips.  When I examined her tongue it looked normal but she says it is swollen.  Swallowing pills has become harder.  I did indicate to her that swollen tongue is a side effect of lisinopril.  We discussed about Norvasc being a better agent for blood pressure control particularly with her and not.  However she feels that she may be sensitive to the Norvasc.  She is also worried about stopping her lisinopril due to fear of renal compromise.  Finally took a shared decision making for her to stop lisinopril for 1 week and see if the swollen tongue will improve.  Also indicated to her that she could continue the lisinopril but continue to monitor her tongue  -Cough: This is significantly improved after reducing the lisinopril and stopping Valtrex.  But she still has cough that when it happens is at least moderate in severity   -Shingles: She stopped the Valtrex because of cough and has been no recurrence in shingles.  She is also back on the CellCept.  The situation is being monitored  -Scleroderma: She does not have a local rheumatologist.  R[facility]umatology [facility] and also [facility].  Dr. Rica MastLami and I plan to have a conference call on  10/25/2022.  She is currently on CellCept 50 mg twice daily and is tolerating it well  # ILD: She never been given a formal diagnosis of ILD but she does have it.  I had Dr. Allegra LaiLeah Strickland review her CT scan of the chest.  She agrees that patient has ILD at least at the 10-20% level.  There is also some pulmonary edema.  Currently most recent pulmonary function test is improved significantly but the overall trend Is 1 of decline particularly this happene afterd she stopped her CellCept in 2023 (see below].  I did indicate to her nintedanib is indicated.  She told me she is sensitive to medications.  I went over the side effect profile of diarrhea, liver function test monitoring rare heart attack diverticulitis.  We took a shared decision making to start the low-dose but pending conversation with Dr. Rica Mast at [facility]. ->  The following day on Friday I did speak to his Dr. Rica Mast at [facility].  He wants to hold off on the nintedanib pending his conversation with the rheumatologist at [facility].  They want to consider IVIG to improve lung function further before starting patient on nintedanib.  He is going to get back to me.  # At risk pulmonary hypertension:-Dr. Madelynn Done to do a right heart catheterization there is no tamponade.  Main pulmonary artery pressure was 24 suggestive and consistent with very mild pulmonary hypertension..For treatment for WHO group 3 pulmonary hypertension his mean pulmonary pressure 25 which is just 1 point higher.  Given the pressing issue of ILD decided to postpone pulmonary hypertension treatment.  I will confirm alignment with Dr. Rica Mast at [facility] -> I did talk to him on the phone following this.  He is supportive and aligned with this plan.  #Pericardial effusion: Dr. Madelynn Done feels it is inflammatory and will use with treatment this should resolve.    SYMPTOM SCALE - ILD 09/05/2022  Current weight   O2 use ra  Shortness of Breath 0 -> 5 scale with 5 being worst (score 6 If  unable to do)  At rest 1  Simple tasks - showers, clothes change, eating, shaving 1  Household (dishes, doing bed, laundry) 2  Shopping x  Walking level at own pace 1  Walking up Stairs 2  Total (30-36) Dyspnea Score 7      Non-dyspnea symptoms (0-> 5 scale) 09/05/2022  How bad is your cough? 3  How bad is your fatigue 3  How bad is nausea 3  How bad is vomiting?  0  How bad is diarrhea? 0  How bad is anxiety? 0  How bad is depression 0  Any chronic pain - if so where and how bad 1     Simple office walk 185 feet x  3 laps goal with forehead probe 09/05/2022  10/24/2022   O2 used ra   Number laps completed 3   Comments about pace pace   Resting Pulse Ox/HR 100% and 87/min   Final Pulse Ox/HR 97% and 107/min   Desaturated </= 88% no   Desaturated <= 3% points yes   Got Tachycardic >/= 90/min yes   Symptoms at end of test x   Miscellaneous comments x       PFT     Latest Ref Rng & Units 10/23/2022    4:24 PM  PFT Results  FVC-Pre L 1.73  P  FVC-Predicted Pre % 58  P  Pre FEV1/FVC % % 85  P  FEV1-Pre L 1.47  P  FEV1-Predicted Pre % 65  P  DLCO uncorrected ml/min/mmHg 8.78  P  DLCO UNC% % 44  P  DLCO corrected ml/min/mmHg 9.27  P  DLCO COR %Predicted % 47  P  DLVA Predicted % 81  P    P Preliminary result       PFT data  for ILD 12/23/2017 2.25/2020 03/07/2020 11/14/20 [place] 05/15/21 05/14/2022 at [facility]  08/27/22 at [facility]  In the context of volume overload and hospitalization 10/24/2022   FVC 2.82 2.92 2.88 2.77 2.75 2.05 1.59 1.73  FVC %      73% 60% 58%  Ratio      77 76   TLC   3.93  4.34 3.09 2.41   TLC %      62% 51%   DLCO 19.2 10.67 11.68 12.29 12.82 10.2 5.38 9.27  DLCO %      52% 29% 47%     RHC MID- MAR 2024   Findings:   RA = 6 RV = 39/12 PA = 39/10 (24) PCW = 6 Fick cardiac output/index = 5.7/3.4 PVR = 3.2 WU Ao sat = 99% PA sat = 74%, 77% SVC sat 75%   Assessment:   Very mild PAH.    Plan/Discussion:     Given scleroderma, will discuss trial of ERA or Tyvaso with Dr. Marchelle Gearing.   Arvilla Meres, MD  8:01 AM  Latest Reference Range & Units 10/02/11 15:53 10/31/11 09:13 01/22/12 11:06 05/11/12 09:33 12/09/12 13:01 12/14/12 12:53 06/16/13 13:05 12/21/13 12:43 06/23/14 09:36 11/29/14 10:13 01/08/17 10:32 06/09/17 11:17 07/30/17 11:21 02/15/19 10:00 01/27/20 11:48 02/15/20 13:53 02/14/21 14:22 02/20/22 10:35 05/22/22 11:47 08/09/22 11:50 08/10/22 05:49 08/22/22 09:51 09/04/22 10:43 09/12/22 10:45 09/19/22 10:22 09/25/22 09:51 10/09/22 10:54  Creatinine 0.40 - 1.20 mg/dL 7.82 9.56 2.13 (H) 1.1 1.1 1.1 1.2 (H) 1.1 1.2 (H) 1.0 1.1 1.2 (H) 1.20 (H) 1.24 (H) 1.02 (H) 1.16 (H) 1.12 (H) 1.10 (H) 1.11 (H) 1.80 (H) 1.74 (H) 2.03 (H) 1.90 (H) 1.35 (H) 1.28 (H) 1.32 (H) 1.39 (H)  (H): Data is abnormally high  Latest Reference Range & Units 08/22/22 09:51 09/04/22 10:43 09/12/22 10:45 09/19/22 10:22 09/25/22 09:51 10/09/22 10:54  GFR >60.00 mL/min 23.93 (L) 25.90 (L) 39.02 (L) 41.59 (L) 40.08 (L) 37.66 (L)  (L): Data is abnormally low   Latest Reference Range & Units 10/02/11 15:53 10/31/11 09:13 01/22/12 11:06 05/11/12 09:33 12/09/12 13:01 06/16/13 13:05 12/21/13 12:43 06/23/14 09:36 11/29/14 10:12 01/08/17 10:32 06/09/17 11:17 07/30/17 11:21 02/15/19 10:00 01/27/20 11:48 02/15/20 13:53 02/14/21 14:22 02/20/22 10:35 05/22/22 11:47 08/09/22 11:50 08/10/22 05:49 08/22/22 09:51 09/04/22 10:43 09/12/22 10:45 09/19/22 10:22 09/25/22 09:51 10/01/22 07:46 10/01/22 07:47 10/01/22 07:50 10/09/22 10:54  Hemoglobin 12.0 - 15.0 g/dL 08.6 57.8 46.9 62.9 52.8 13.0 12.8 13.3 13.4 12.4 11.1 (L) 11.8 (L) 9.3 (L) 10.5 (L) 10.4 (L) 12.0 13.0 13.2 12.7 12.7 11.2 (L) 11.9 (L) 11.9 (L) 11.6 (L) 12.0 10.2 (L) 10.2 (L) 10.2 (L) 11.8 (L)  (L): Data is abnormally low   has a past medical history of Anemia, Breastr cancer, IDC, Left UOQ, clinical stage II Receptor +, Her 2 - (08/29/2011 DX), Colon polyp, Contracture of hand, Depression,  Esophageal stricture, GERD (gastroesophageal reflux disease), Hiatal hernia, History of external beam radiation therapy (11-25-2011 to 01-08-2012), Hyperlipidemia, Hypertension, Hypothyroidism, Internal hemorrhoids, Osteoarthritis, Osteopenia, Positive ANA (antinuclear antibody), Rash, Raynaud's syndrome, Scleroderma involving lung (pulmologist-  dr h. Gaynell Face at [facility]), Skin thickening, Systemic sclerosis, and UTI (urinary tract infection).   reports that she has never smoked. She has never used smokeless tobacco.  Past Surgical History:  Procedure Laterality Date   BIOPSY  01/29/2018   Procedure: BIOPSY;  Surgeon: Beverley Fiedler, MD;  Location: [facility];  Service: Gastroenterology;;   Arbutus Leas  2000   Left   COLONOSCOPY WITH PROPOFOL N/A  01/29/2018   Procedure: COLONOSCOPY WITH PROPOFOL;  Surgeon: Beverley Fiedler, MD;  Location: [facility];  Service: Gastroenterology;  Laterality: N/A;   DILATATION & CURETTAGE/HYSTEROSCOPY WITH MYOSURE N/A 08/04/2017   Procedure: DILATATION & CURETTAGE/HYSTEROSCOPY WITH MYOSURE;  Surgeon: Jerene Bears, MD;  Location[facility];  Service: Gynecology;  Laterality: N/A;   ECTOPIC PREGNANCY SURGERY  1986-88   s/p unilateral salpingectomy   ESOPHAGOGASTRODUODENOSCOPY (EGD) WITH PROPOFOL N/A 01/29/2018   Procedure: ESOPHAGOGASTRODUODENOSCOPY (EGD) WITH PROPOFOL;  Surgeon: Beverley Fiedler, MD;  Location: [facility];  Service: Gastroenterology;  Laterality: N/A;   PARTIAL MASTECTOMY WITH AXILLARY SENTINEL LYMPH NODE BIOPSY Left 11-04-2011   dr Jamey Ripa  [facility]   RIGHT HEART CATH N/A 10/01/2022   Procedure: RIGHT HEART CATH;  Surgeon: Dolores Patty, MD;  Location: [facility] INVASIVE CV LAB;  Service: Cardiovascular;  Laterality: N/A;   TRANSTHORACIC ECHOCARDIOGRAM  06-04-2017    [facility]   moderate LVH, ef>55%/  trivial AR and TR/ mild MR and PR/  trivial pericardial effusion   WISDOM TOOTH EXTRACTION      Allergies  Allergen Reactions   Betadine  [Povidone Iodine] Rash   Contrast Media [Iodinated Contrast Media] Rash and Other (See Comments)    "per pt: recently had CT 08/ 2018 even through given pre-medication, IVP still caused rash"   Epinephrine Other (See Comments)    Severe headaches    Iodine Rash and Other (See Comments)   Oxycodone Anxiety   Prednisone Anaphylaxis    Per Nephrology pt should avoid this medication because she has Scleroderma and it will interfere with her Kidneys,    Gabapentin Other (See Comments)    Dizziness   Lidocaine Rash   Nickel Rash and Other (See Comments)    Rash and blisters   Other Rash and Other (See Comments)    Hospital gown   Penicillins Rash and Other (See Comments)    Rash and blisters Has patient had a PCN reaction causing immediate rash, facial/tongue/throat swelling, SOB or lightheadedness with hypotension: Yes Has patient had a PCN reaction causing severe rash involving mucus membranes or skin necrosis: No Has patient had a PCN reaction that required hospitalization: No Has patient had a PCN reaction occurring within the last 10 years: No If all of the above answers are "NO", then may proceed with Cephalosporin use.    Sulfa Antibiotics Rash    Immunization History  Administered Date(s) Administered   Tdap 04/12/2008, 12/28/2018   Zoster, Live 08/16/2011    Family History  Problem Relation Age of Onset   Heart disease Mother    Heart failure Mother    Heart disease Father    Ovarian cancer Maternal Aunt    Heart disease Paternal Uncle        multiple   Autoimmune disease Sister    Arrhythmia Sister    Anemia Sister    CAD Brother    Colon cancer Neg Hx    Stomach cancer Neg Hx    Esophageal cancer Neg Hx    Rectal cancer Neg Hx      Current Outpatient Medications:    acetaminophen (TYLENOL) 325 MG tablet, Take 2 tablets (650 mg total) by mouth every 6 (six) hours as needed for mild pain or headache., Disp: , Rfl:    acetaminophen (TYLENOL) 650 MG CR tablet,  Take 650 mg by mouth daily as needed for pain., Disp: , Rfl:    Cholecalciferol 1.25 MG (50000 UT) capsule, Take 1 capsule (  50,000 Units total) by mouth once a week., Disp: 4 capsule, Rfl: 2   ELDERBERRY PO, Take 1 oz by mouth 2 (two) times daily. Liquid, Disp: , Rfl:    furosemide (LASIX) 20 MG tablet, Take 20 mg by mouth daily., Disp: , Rfl:    Multiple Vitamin (MULTIVITAMIN) capsule, Take 1 capsule by mouth daily., Disp: , Rfl:    mycophenolate (CELLCEPT) 500 MG tablet, Take 1,500 mg by mouth 2 (two) times daily., Disp: , Rfl:    pantoprazole (PROTONIX) 40 MG tablet, Take 40 mg by mouth 2 (two) times daily., Disp: , Rfl:    sodium chloride (OCEAN) 0.65 % SOLN nasal spray, Place 1 spray into both nostrils as needed for congestion., Disp: , Rfl:    SYNTHROID 125 MCG tablet, Take 125 mcg by mouth daily before breakfast. *Must be the brand only*, Disp: , Rfl:    amLODipine (NORVASC) 2.5 MG tablet, Take 2.5 mg by mouth daily. (Patient not taking: Reported on 10/24/2022), Disp: , Rfl:    levothyroxine (SYNTHROID) 100 MCG tablet, Take 1 tablet (100 mcg total) by mouth daily. (Patient not taking: Reported on 10/24/2022), Disp: 90 tablet, Rfl: 3   lisinopril (ZESTRIL) 40 MG tablet, Take 1 tablet (40 mg total) by mouth daily. (Patient not taking: Reported on 10/24/2022), Disp: 30 tablet, Rfl: 3   valACYclovir (VALTREX) 500 MG tablet, Take 1 tablet (500 mg total) by mouth daily. (Patient not taking: Reported on 10/24/2022), Disp: 30 tablet, Rfl: 3      Objective:   Vitals:   10/24/22 1633  BP: 108/60  Pulse: 86  Temp: 98.8 F (37.1 C)  TempSrc: Oral  SpO2: 99%  Weight: 141 lb 12.8 oz (64.3 kg)  Height: 5' 4.5" (1.638 m)    Estimated body mass index is 23.96 kg/m as calculated from the following:   Height as of this encounter: 5' 4.5" (1.638 m).   Weight as of this encounter: 141 lb 12.8 oz (64.3 kg).  @  [employer]   10/24/22 1633  Weight: 141 lb 12.8 oz (64.3 kg)      Physical Exam  General: No distress. Looks stable.  Neuro: Alert and Oriented x 3. GCS 15. Speech normal Psych: Pleasant Resp:  Barrel Chest - no.  Wheeze - no, Crackles - YES BASE, No overt respiratory distress CVS: Normal heart sounds. 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 PEERL +. No post nasal drip        Assessment:     No diagnosis found.     Plan:     Patient Instructions  Diffuse systemic sclerosis (HCC) ILD (interstitial lung disease) (HCC)   = you have progressive ILD sice Nov 2022. Improved In April 2024 compared to February 2024 but still worse than October 2023 -CellCept is helping you  Plan  - cellcept via [facility] for SSc renal crisis and general SSc treatment is beneficial  for the lungs   - so continue this - Recommend starting nintedanib [Ofev) -100 mg twice daily with food   - will discuss  Dr Aurther Loft at [facility] before committing the recommendation  -Check CBC, chemistry, liver function test and BNP in the next few to several days  -Do spirometry and DLCO in the next 12 weeks  Elevated diaphragm  -You might have elevation of the left hemidiaphragm and this could be contributing to her shortness of breath  Plan - Do sniff test  Mild pulmonary hypertension  -  Mean pulmonary artery pressures 24 and 1 point below are meeting criteria to start medication  Plan - At this point in time I want to focus on starting treatment for fibrosis -Hold off on starting pulmonary hypertension treatment but I will discuss with the people at [facility] about this  Acute scleroderma renal crisis [facility]) -January 2024 in the setting of stopping CellCept because of shingles  -Creatinine for your history is improving once you have gone back on CellCept and controlling her blood pressure  Plan -According to renal   Other secondary hypertension Encounter for medication refill  -Seems blood pressure is  better with lisinopril 20 mg [40 mg was causing to lower blood pressure]  Plan  - Continue good blood pressure control through primary care   Swollen tongue  -I do not notice a swollen tongue but based on her history it is swollen.  I think lisinopril might be the culprit  Plan  - Will talk to people at [facility] but I think might not be a bad idea for you to stop lisinopril for a week and see if the tongue swelling improves  Pericardial effusion  - deemed small and inflammatory by Dr Gala Romney  Plan  - per Dr Lucas Mallow   Follow-up -2-week video visit at 4:30 PM with Dr. Becky Augusta complex medical condition requiring high risk prescription with intensive therapeutic monitoring requirement   SIGNATURE    Dr. Kalman Shan, M.D., F.C.C.P,  Pulmonary and Critical Care Medicine Staff Physician, [facility] Director - Interstitial Lung Disease  Program  Pulmonary Fibrosis [facility] at [facility] [place], [place], [address]  Pager: [phone], If no answer or between  15:00h - 7:00h: call [phone] Telephone: [phone]  5:21 PM 10/24/2022     HIGh Complexity The table below is from the 2021 E/M guidelines, first released in 2021, with minor revisions added in 2023. Must meet the requirements for 2 out of 3 dimensions to qualify.    Number and complexity of problems addressed Amount and/or complexity of data reviewed Risk of complications and/or morbidity  Severe exacerbation of chronic illness  Acute or chronic illnesses that may pose a threat to life or bodily function, e.g., multiple trauma, acute MI, pulmonary embolus, severe respiratory distress, progressive rheumatoid arthritis, psychiatric illness with potential threat to self or others, peritonitis, acute renal failure, abrupt change in neurological status Must meet the requirements for 2 of 3 of the categories)  Category 1: Tests and documents,  historian  Any combination of 3 of the following:  Assessment requiring an independent historian  Review of prior external records  Review of results of each unique test  Ordering of each unique test    Category 2: Interpretation of tests  Independent interpretation of a test perfromed by another physician/NPP  Category 3: Discuss management/tests  Discussion of magagement or tests with an external physician/NPP Drug therapy requiring intensive monitoring for toxicity  Decision for elective major surgery with identified pateint or procedure risk factors  Decision regarding hospitalization or escalation of level of care  Decision for DNR or to de-escalate care   Parenteral controlled  substances

## 2022-10-24 NOTE — Patient Instructions (Signed)
Spiro/ DLCO peformed today.

## 2022-10-24 NOTE — Patient Instructions (Addendum)
Diffuse systemic sclerosis (HCC) ILD (interstitial lung disease) (HCC)   = you have progressive ILD sice Nov 2022. Improved In April 2024 compared to February 2024 but still worse than October 2023 -CellCept is helping you  Plan  - cellcept via [facility] for SSc renal crisis and general SSc treatment is beneficial  for the lungs   - so continue this - Recommend starting nintedanib [Ofev) -100 mg twice daily with food   - will discuss  Dr Aurther Loft at [facility] before committing the recommendation (update 10/25/22: [facility] wants to consider IVIG before committing to nintedanib.  They will get back to me   -Check CBC, chemistry, liver function test and BNP in the next few to several days  -Do spirometry and DLCO in the next 12 weeks  Elevated diaphragm  -You might have elevation of the left hemidiaphragm and this could be contributing to her shortness of breath  Plan - Do sniff test  Mild pulmonary hypertension  -Mean pulmonary artery pressures 24 and 1 point below are meeting criteria to start medication  Plan - At this point in time I want to focus on starting treatment for fibrosis -Hold off on starting pulmonary hypertension treatment but I will discuss with the people at [facility] about this  Acute scleroderma renal crisis [facility]) -January 2024 in the setting of stopping CellCept because of shingles  -Creatinine for your history is improving once you have gone back on CellCept and controlling her blood pressure  Plan -According to renal   Other secondary hypertension Encounter for medication refill  -Seems blood pressure is better with lisinopril 20 mg [40 mg was causing to lower blood pressure]  Plan  - Continue good blood pressure control through primary care   Swollen tongue  -I do not notice a swollen tongue but based on her history it is swollen.  I think lisinopril might be the culprit  Plan  - Will talk to people at [facility] but I think might not be a bad idea  for you to stop lisinopril for a week and see if the tongue swelling improves  Pericardial effusion  - deemed small and inflammatory by Dr Gala Romney  Plan  - per Dr Lucas Mallow   Follow-up -2-week video visit at 4:30 PM with Dr. Marchelle Gearing

## 2022-10-24 NOTE — Progress Notes (Signed)
Spiro/DLCO performed today.  

## 2022-10-28 ENCOUNTER — Telehealth: Payer: Self-pay | Admitting: Internal Medicine

## 2022-10-28 ENCOUNTER — Other Ambulatory Visit (HOSPITAL_COMMUNITY): Payer: Self-pay | Admitting: Internal Medicine

## 2022-10-28 ENCOUNTER — Other Ambulatory Visit (INDEPENDENT_AMBULATORY_CARE_PROVIDER_SITE_OTHER): Payer: Medicare Other

## 2022-10-28 DIAGNOSIS — R0600 Dyspnea, unspecified: Secondary | ICD-10-CM | POA: Diagnosis not present

## 2022-10-28 DIAGNOSIS — Z5181 Encounter for therapeutic drug level monitoring: Secondary | ICD-10-CM

## 2022-10-28 LAB — HEPATIC FUNCTION PANEL
ALT: 26 U/L (ref 0–35)
AST: 49 U/L — ABNORMAL HIGH (ref 0–37)
Albumin: 4 g/dL (ref 3.5–5.2)
Alkaline Phosphatase: 60 U/L (ref 39–117)
Bilirubin, Direct: 0.1 mg/dL (ref 0.0–0.3)
Total Bilirubin: 0.4 mg/dL (ref 0.2–1.2)
Total Protein: 6.8 g/dL (ref 6.0–8.3)

## 2022-10-28 LAB — CBC WITH DIFFERENTIAL/PLATELET
Basophils Absolute: 0.1 10*3/uL (ref 0.0–0.1)
Basophils Relative: 0.8 % (ref 0.0–3.0)
Eosinophils Absolute: 0.2 10*3/uL (ref 0.0–0.7)
Eosinophils Relative: 2.2 % (ref 0.0–5.0)
HCT: 30 % — ABNORMAL LOW (ref 36.0–46.0)
Hemoglobin: 10.3 g/dL — ABNORMAL LOW (ref 12.0–15.0)
Lymphocytes Relative: 9.1 % — ABNORMAL LOW (ref 12.0–46.0)
Lymphs Abs: 0.7 10*3/uL (ref 0.7–4.0)
MCHC: 34.4 g/dL (ref 30.0–36.0)
MCV: 90.5 fl (ref 78.0–100.0)
Monocytes Absolute: 0.7 10*3/uL (ref 0.1–1.0)
Monocytes Relative: 8.3 % (ref 3.0–12.0)
Neutro Abs: 6.3 10*3/uL (ref 1.4–7.7)
Neutrophils Relative %: 79.6 % — ABNORMAL HIGH (ref 43.0–77.0)
Platelets: 320 10*3/uL (ref 150.0–400.0)
RBC: 3.32 Mil/uL — ABNORMAL LOW (ref 3.87–5.11)
RDW: 18.2 % — ABNORMAL HIGH (ref 11.5–15.5)
WBC: 7.9 10*3/uL (ref 4.0–10.5)

## 2022-10-28 LAB — BRAIN NATRIURETIC PEPTIDE: Pro B Natriuretic peptide (BNP): 71 pg/mL (ref 0.0–100.0)

## 2022-10-28 LAB — BASIC METABOLIC PANEL
BUN: 20 mg/dL (ref 6–23)
CO2: 22 mEq/L (ref 19–32)
Calcium: 9.6 mg/dL (ref 8.4–10.5)
Chloride: 100 mEq/L (ref 96–112)
Creatinine, Ser: 1.38 mg/dL — ABNORMAL HIGH (ref 0.40–1.20)
GFR: 37.97 mL/min — ABNORMAL LOW (ref >60.00)
Glucose, Bld: 100 mg/dL — ABNORMAL HIGH (ref 70–99)
Potassium: 4.1 mEq/L (ref 3.5–5.1)
Sodium: 134 mEq/L — ABNORMAL LOW (ref 135–145)

## 2022-10-28 NOTE — Telephone Encounter (Signed)
Atc x1 left VMM for pt to call OV back

## 2022-10-28 NOTE — Telephone Encounter (Signed)
Called and spoke with Ms.Orlov. We went over notes left from Dr. Marchelle Gearing. Patient verbalized understanding, she also wanted to let Dr. Marchelle Gearing how thankful she was for talking with [facility] and, his interest in her complicated case.  Nothing further needed at this time.

## 2022-10-28 NOTE — Telephone Encounter (Signed)
Please let [name] kno that I spoke with Dr Aurther Loft of [facility] re variou sissue  Mild pulm htn - he was aligned with watching now 2. ILD/Pulm Fibrosis - he agreed to continue cellcept but re OFEV - hold off starting for now because he wants to discus IvIG with Dr Allena Katz his rheum colleague before saying yes/no to colleae  3. SEND Message back to me please   Thanks    SIGNATURE    Dr. Kalman Shan, M.D., F.C.C.P,  Pulmonary and Critical Care Medicine Staff Physician, [facility] Director - Interstitial Lung Disease  Program  Medical Director - [facility] Pulmonary Fibrosis [facility] Network at [facility] [place], [place], [address]   Pager: [phone], If no answer  -> Check AMION or Try [phone] Telephone (clinical office): [phone] Telephone (research): [phone]  8:25 AM 10/28/2022

## 2022-10-29 NOTE — Progress Notes (Signed)
Creat 1.38mg %, Sodum low normal. AST liver enzyme hgh normal but all3 seem c/s recent  baseline. STill waiting to hear from Dr Aurther Loft at [facility] about [facility]

## 2022-10-30 NOTE — Telephone Encounter (Signed)
Since pt called the office, she was seen for a visit with the office. Closing encounter.

## 2022-10-31 ENCOUNTER — Telehealth: Payer: Self-pay | Admitting: Family Medicine

## 2022-10-31 NOTE — Telephone Encounter (Signed)
Contacted [name] to schedule their annual wellness visit. Appointment made for 11/14/2022.  Verlee Rossetti; Care Guide Ambulatory Clinical Support [place] [facility] Direct Dial: [phone]

## 2022-11-01 ENCOUNTER — Ambulatory Visit: Payer: Medicare Other | Admitting: Family

## 2022-11-01 ENCOUNTER — Other Ambulatory Visit: Payer: Medicare Other

## 2022-11-04 ENCOUNTER — Ambulatory Visit
Admission: RE | Admit: 2022-11-04 | Discharge: 2022-11-04 | Disposition: A | Discharge status: Home / Self Care | Payer: Medicare Other | Source: Ambulatory Visit | Attending: Internal Medicine | Admitting: Internal Medicine

## 2022-11-04 DIAGNOSIS — R0600 Dyspnea, unspecified: Secondary | ICD-10-CM | POA: Diagnosis not present

## 2022-11-04 DIAGNOSIS — M349 Systemic sclerosis, unspecified: Secondary | ICD-10-CM | POA: Diagnosis not present

## 2022-11-04 DIAGNOSIS — J986 Disorders of diaphragm: Secondary | ICD-10-CM

## 2022-11-05 ENCOUNTER — Telehealth: Payer: Self-pay | Admitting: Internal Medicine

## 2022-11-05 DIAGNOSIS — M62838 Other muscle spasm: Secondary | ICD-10-CM | POA: Diagnosis not present

## 2022-11-05 DIAGNOSIS — R2681 Unsteadiness on feet: Secondary | ICD-10-CM | POA: Diagnosis not present

## 2022-11-05 DIAGNOSIS — M25551 Pain in right hip: Secondary | ICD-10-CM | POA: Diagnosis not present

## 2022-11-05 DIAGNOSIS — M6281 Muscle weakness (generalized): Secondary | ICD-10-CM | POA: Diagnosis not present

## 2022-11-05 NOTE — Telephone Encounter (Signed)
Plase find out from patinet if [facility] called her yet and have recommended IvIG or not?

## 2022-11-05 NOTE — Telephone Encounter (Signed)
Called and spoke with pt who states that she has talked with [facility]. She said that they have not necessary zeroed in on the IVIG. Pt stated that they told her that they were supposed to be writing to Dr. Marchelle Gearing to let know about the conversation that they  had with pt. Routing back to Dr. Marchelle Gearing.

## 2022-11-06 ENCOUNTER — Encounter: Payer: Self-pay | Admitting: Internal Medicine

## 2022-11-07 DIAGNOSIS — J849 Interstitial pulmonary disease, unspecified: Secondary | ICD-10-CM | POA: Diagnosis not present

## 2022-11-07 DIAGNOSIS — M609 Myositis, unspecified: Secondary | ICD-10-CM | POA: Diagnosis not present

## 2022-11-07 DIAGNOSIS — M349 Systemic sclerosis, unspecified: Secondary | ICD-10-CM | POA: Diagnosis not present

## 2022-11-08 NOTE — Telephone Encounter (Signed)
Received the following message from patient:  Dr. Marchelle Gearing, Dr. Martyn Malay, Associate Professor of medicine and Director of the division of Rheumatology at [facility] (also Co-Director of the [facility]), called me today to discuss the IVIG.  Dr. West Bali is my doctor at [facility] and handled navigation of my hospital stay back in February. Her contact number is: [phone].  She has ordered some blood test for me to have this week to use to submit to my insurance company for approval of the IVIG procedure.  I am scheduled to have the blood test tomorrow.  This should help with the movement forward towards a decision.  I will also need a particular type of breathing test for insurance purposes and documentation.  I do not have the breathing test order or particulars information.  I am hopeful you will be able to assist with that test once I have the order.  This you should bring you up to date.  Let me know if you have any questions.  [name], [phone]    MR, can you please advise? Thanks!

## 2022-11-12 ENCOUNTER — Encounter: Payer: Self-pay | Admitting: Internal Medicine

## 2022-11-12 ENCOUNTER — Telehealth: Payer: Self-pay | Admitting: Internal Medicine

## 2022-11-12 ENCOUNTER — Telehealth (INDEPENDENT_AMBULATORY_CARE_PROVIDER_SITE_OTHER): Payer: Medicare Other | Admitting: Internal Medicine

## 2022-11-12 ENCOUNTER — Encounter: Payer: Self-pay | Admitting: Family Medicine

## 2022-11-12 VITALS — Ht 64.5 in | Wt 133.2 lb

## 2022-11-12 DIAGNOSIS — J8489 Other specified interstitial pulmonary diseases: Secondary | ICD-10-CM

## 2022-11-12 DIAGNOSIS — I959 Hypotension, unspecified: Secondary | ICD-10-CM

## 2022-11-12 DIAGNOSIS — I158 Other secondary hypertension: Secondary | ICD-10-CM

## 2022-11-12 DIAGNOSIS — M349 Systemic sclerosis, unspecified: Secondary | ICD-10-CM | POA: Diagnosis not present

## 2022-11-12 DIAGNOSIS — M359 Systemic involvement of connective tissue, unspecified: Secondary | ICD-10-CM | POA: Diagnosis not present

## 2022-11-12 DIAGNOSIS — G729 Myopathy, unspecified: Secondary | ICD-10-CM

## 2022-11-12 DIAGNOSIS — R748 Abnormal levels of other serum enzymes: Secondary | ICD-10-CM | POA: Diagnosis not present

## 2022-11-12 DIAGNOSIS — J849 Interstitial pulmonary disease, unspecified: Secondary | ICD-10-CM

## 2022-11-12 NOTE — Progress Notes (Signed)
08/27/22 [facility] Maryln [facility]   IMPRESSION: 1. Interstitial lung disease/fibrosis. Nonspecific patchy bilateral groundglass opacities. 2. Moderate pericardial effusion. 3. Small left pleural effusion, trace right pleural effusion. 4. Mediastinal adenopathy.  Assessment and Plan Ms. Jelinski is a 72 y.o. 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 The patient also has a history of left breast cancer. Since her last visit, she has evidence of increased SSc disease activity as manifested by skin thickening, oropharyngeal dysphagia, tendon friction rubs and scleroderma renal crisis. Her course has also been complicated by worsening lower extremity edema. It is unclear what is driving her worsening disease activity, though notably she had been off of MMF for a period of time prior to the recurrence of her symptoms. Notably, she has an elevated CA 27.29 in the past - which makes one wonder about subclinical malignancy driving her symptoms.  Cutaneous: Skin score today was 17, a significant increase from her score of 3 at our last visit; we note that some of this is confounded by her lower extremity edema. However, despite this, we are in agreement that her skin score has worsened. She has been restarted on mycophenolate mofetil, and is currently on 1000 mg BID. We discussed that given her renal function we would not increase this further. She does have a history of difficult to treat shingles (lasted 9 months) when she previously was on MMF. She has been placed on valacyclovir prophylaxis. We discussed that it would likely take a few months to see the full effect of this medication. - Continue MMF 1000 mg BID - Monitoring labs with local rheumatologist  Raynaud's phenomenon: Her symptoms are stable.  She is on amlodipine for blood pressure management which may have additional benefit for her RP.  Cardiopulmonary: CT scan demonstrated bilateral ground glass opacities; however, these are non-specific in the setting of her volume overload. In addition, her PFTs are difficult to interpret in the setting of her known volume overload as well. Once she is euvolemic, these tests should be repeated. She is also planned to see Dr. Aurther Loft in pulmonary in our Center tomorrow.  Gastrointestinal: She has GERD which is stable on pantoprazole 40 mg daily. Barium swallow demonstrated cricopharyngeal bar. She is planned for endoscopy next week to further evaluate her oropharyngeal dysphagia.  MSK: She has evidence of tendon friction rubs on exam, though she denies significant arthralgias. No evidence of inflammatory arthritis. She notes generalized weakness in the setting of her recent hospitalization but her strength is intact on exam.  Renal: She recently was hospitalized in the setting of scleroderma renal crisis. Her blood pressure today was stable at 122/68, and she has been monitoring her blood pressure closely at home. She is currently managed with lisinopril 40 mg and amlodipine 10 mg . She does not currently have a nephrologist and we strongly recommended that she establish care with one. We will reach out to Dr. Sherryll Burger at [facility] to help coordinate care.  Patient was evaluated with Dr. Sherryll Burger after which the above assessment and plan was formulated. Please see attending attestation for further details.    08/28/2022 Dr. Mammie Russian -[facility] interstitial lung disease center   Rheumatologist: Dr. Sherryll Burger Primary pulmonologist, if applicable: Seen previously in the [facility] ILD center by Dr. Gaynell Face  [id number] Bittel is a 72 y.o. female with a PMH of SSc  and ILD who presents for an initial evaluation regarding dyspnea. She is followed by Dr. Sherryll Burger and was first diagnosed with scleroderma in 2017  based on a high titer ANA and skin thickening. She has been followed previously in the [facility] ILD center, with her most recent office note being in 2020. In terms of her respiratory history, she had a dry cough that developed in 2018 and had a chest CT done that year which showed bibasilar interstitial infiltrates consistent with an NSIP pattern. She was started on CellCept in December 2018 with significant improvement in [facility] and diffusion capacity. Her CellCept was stopped in January 2023 due to prolonged episode of shingles.  She was recently admitted to [facility] for scleroderma renal crisis. By the time of discharge, she had improvement but not normalization of her creatinine. Blood pressure was better controlled. Mycophenolate was added back due to a worsening of her skin disease (now on 1000mg  BID). Since she was discharged from the hospital, she feels like she is slowly improving but continues to have increased edema.  Was able to walk up and down stairs easily in October, when she had an anniversary party  Since her hospitalization, she has had trouble walking up her steps.   Impression: Ms. Fiorello is a 56 y.o. 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 She had signs of mild interstitial lung disease on the CAT scan report from 2018. She was started on CellCept at that point and had a significant improvement in the forced vital capacity. Overall, she feels like her breathing has been worse since July 2023. She recently was discharged from [facility] with scleroderma renal crisis. Her creatinine continues to improve, but she still exhibits signs of volume overload both on exam and imaging with a moderate pericardial effusion and small bilateral pleural effusions. Her breathing symptoms have been worse in the context and her pulmonary function tests this week are challenging to interpret with her acute recent hospitalization. However,  she did have PFTs done at [facility] in October, which was prior to her hospitalization that showed a pattern of worsening in terms of her FVC and DLCO. So it clearly appears that she had progressive ILD even before her scleroderma renal crisis. She started back on CellCept 1000 mg twice per day. Dr. Maryln Manuel) Sherryll Burger would like her to stay on this dose rather than increase it to 1500 mg twice per day because of her kidney function.  Plan: -Continue CellCept. Agree with the current dosing, but as her creatinine improves I would prefer to get her up to 1500 mg twice per day -Repeat PFTs in 3 months - I note that her CK and inflammatory markers are elevated. This is likely in the context of her renal crisis. I will talk to Dr. Sherryll Burger about IVIG, although the amount of volume would not be well-tolerated at this point. -Continue furosemide through the weekend. Her rheumatologist at [facility] is working on getting her a sooner appointment with a nephrologist, who will be directing her diuretic therapy. For now I would suggest getting weekly creatinine values until she can see nephrology   OV 09/05/2022 - new consult = referred by Dr Shan Levans  Subjective:  Patient ID: [name], female , DOB: 1950-10-03 , age 48 y.o. , MRN: [id number] , ADDRESS: [address] [place] [address] PCP Copland, Gwenlyn Found, MD Patient Care Team: Copland, Gwenlyn Found, MD as PCP - General (Family Medicine) Allena Katz,  Noberto Retort, DO as Consulting Physician (Neurology) Delsa Grana, MD Tami Ribas Gae Gallop, MD as Referring Physician (Rheumatology) Merrilyn Puma, MD as Referring Physician (Pulmonary Disease) Jerene Bears, MD as Consulting Physician (Gynecology) Durenda Age, MD as Referring Physician (Internal Medicine) Donzetta Starch, MD as Consulting Physician (Dermatology) Jerene Bears, MD as Consulting Physician (Gynecology) Pyrtle, Carie Caddy, MD as Consulting Physician (Gastroenterology) Myna Hidalgo Rose Phi, MD as Medical  Oncologist (Oncology)  This Provider for this visit: Treatment Team:  Attending Provider: Kalman Shan, MD    09/05/2022 -   Chief Complaint  Patient presents with   Consult    Scleroderma      HPI NIAMYA VITTITOW 72 y.o. -history is from the patient, husband acting as independent historian, detailed review of the external medical records [address] and also admissions and also [facility].  The pertinent details from [facility] visit with both rheumatology and pulmonary is detailed above.  Patient is personal friend of Adriana Simas who is known to me.  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  She is being followed at [facility].  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  But pulmonary function test has been stable all the way through 2022.  During this time she was maintained on CellCept through Dr.[employer]  Then in early 2023 developed shingles around the left ear and the neck.  Husband did show photographs of this.  During this time CellCept was making the shingles record and poorly healing.  Therefore CellCept had to be stopped.  She was pretty much off CellCept through the course of 2023.  During this time as of October 2023 her pulmonary function decline compared to 2022 but she was actually unaware of this.  Then abruptly in January 2024 she ended up with hypertension and prior to that was having worsening pedal edema].  She was admitted for renal crisis at [facility].  Her baseline creatinine was 1.1 mg percent [November 2023] but on 08/09/2022 it was 1.8 mg percent.  The following day on 08/10/2022 she was transferred to [facility] because of complex medical condition.  She was  started on CellCept there she spent 6 days then then discharge.  During this time because of concern of shingles recommend she is being maintained on daily acyclovir.  She is also on amlodipine and lisinopril.  She is continuing this.  She is also on diuresis.  She is continuing this.  She says she is diuresed well.  Currently blood pressures have improved significantly between 109 - 128 systolic.  Of note while at [facility] health she did have CT scan of the chest [I personally visualized today] she actually has ILD although radiologist does not mention this.  She has pericardial effusion both on the echo at [place] [facility] and the CT scan.  This was absent even as late as October 2023.  She followed up with rheumatology and pulmonary at [facility] both Dr. Martyn Malay [rheumatology] and also Dr. Anthoney Harada in pulmonary.  Their notes are documented below but the consensus that she actually has ILD and is actually progressive even before the renal crisis.  The pulmonary function test after renal crisis shows further progression although this could be clouded by all the volume  overload issues.  The supporting CellCept.  It appears nintedanib was not discussed with the patient.  She does not recollect this.  They are concerned about her dysphagia and I recommended endoscopy but given the pericardial effusion want to pulmonary hypertension/cardiac issues stabilized before undergoing any endoscopy there also recommended weekly creatinine.  Her latest creatinine yesterday was 1.9 mg percent.  [Peak creatinine 2.03 mg percent 08/22/2022).  She is trying to get an appointment with nephrology.  I personally spoke to Dr. Jomarie Longs: None ordered today over the phone.  Of note she has never had a right heart catheterization. It appears she has not seen [facility] pulmonary/ILD     Past Medical History :   In terms of her breast cancer, she had a left sided lumpectomy and radiation. She is being followed for a possible lesion on her right  breast, but recent testing has been negative (other than biomarkers)  SSc disease characteristics from [facility] Cutaneous subtype: Diffuse Autoantibody profile: RNA pol III Pulmonary involvement: ILD Cardiac involvement: Pericardial effusion GI involvement: Dysphagia Skin/vascular: Raynaud's, Telangiectases, and Sclerodactyly  She did get radiation for breast cancer.   ROS:  -Positive for scleroderma with Raynaud's.  It appears the skin score is worsened -Immunosuppressed status - Shingles in 2023 - Dysphagia - Fatigue - Dry eyes  FAMILY HISTORY of LUNG DISEASE:  -Negative  PERSONAL EXPOSURE HISTORY:  -Denies any smoking or marijuana or vaping or intravenous drug use.  HOME  EXPOSURE and HOBBY DETAILS :  [place]  [place] is lived there for 17 years.  The age of the home is 17 years.  Detail organic and inorganic antigen history is negative.  She is retired.  She is involved with the [employer].  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  Detail organic and inorganic antigen exposure history is negative.  PULMONARY TOXICITY HISTORY (27 items):  X-radiation for breast cancer  INVESTIGATIONS: -GFR of 25.9 with an AST of 43 and a creatinine 1.9 mg percent 09/04/2022.  HRCT 05/20/2017  - at [facility] Per report   1.  Mild lower lobe and peripheral predominant reticulations, minimal traction bronchiectasis, and ground glass opacities with no honeycombing. Findings representing probable UIP pattern as per Fleischner criteria. 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 2.  Interval development of small bilateral pleural effusions and a small pericardial effusion. 3.  Interval increase in anterior left lower lobe pleural thickening and subpleural reticulation suggestive of post radiation changes. Is only seen on prior examination. 4.  There is a  nonspecific 2 mm nodule in the right upper lobe which is not seen on prior examination likely due to different technique. Attention on follow-up examination as per clinical protocol is recommended.  Electronically Signed by:  Glo Herring, MD, [facility] Electronically Signed on:  05/20/2017 2:04 PM Procedure Note  Glo Herring, MD - 05/20/2017 Formatting of this note might be different from the original. Chest CT  Indication: Interstitial lung disease, Scleroderma involving lung , M34.9 Systemic sclerosis, unspecified (CMS-HCC).  Comparison: January 21, 2017   CT Chest data wo contrst Jan 2024 -personally visualized.  My personal interpretation is that there is ILD.  ILD is also been reported in the subsequent high-resolution CT chest at [facility]  Narrative & Impression  CLINICAL DATA:  Shortness of breath.  History of breast cancer.   EXAM: CT CHEST WITHOUT CONTRAST   TECHNIQUE: Multidetector CT  imaging of the chest was performed following the standard protocol without IV contrast.   RADIATION DOSE REDUCTION: This exam was performed according to the departmental dose-optimization program which includes automated exposure control, adjustment of the mA and/or kV according to patient size and/or use of iterative reconstruction technique.   COMPARISON:  August 09, 2022.  February 28, 2022.   FINDINGS: Cardiovascular: No evidence of thoracic aortic aneurysm. Mild cardiomegaly is noted. Mild to moderate pericardial effusion is noted.   Mediastinum/Nodes: No enlarged mediastinal or axillary lymph nodes. Thyroid gland, trachea, and esophagus demonstrate no significant findings.   Lungs/Pleura: Small bilateral pleural effusions are noted. Minimal bibasilar pulmonary edema or subsegmental atelectasis is noted. No pneumothorax is noted.   Upper Abdomen: No acute abnormality.   Musculoskeletal: Stable old left rib fractures are noted. No acute osseous abnormality is  noted.   IMPRESSION: Mild to moderate size pericardial effusion is now noted.   Small bilateral pleural effusions are noted with associated minimal bibasilar pulmonary edema or subsegmental atelectasis.     Electronically Signed   By: Lupita Raider M.D.   On: 08/10/2022 14:47      CT CHEST HRCT 08/26/22 at [facility] -this was done at [facility].  I personally could not visualize this because the upload system would not work.  IMPRESSION: 1.  Interstitial lung disease/fibrosis. Nonspecific patchy bilateral groundglass opacities. 2.  Moderate pericardial effusion. 3.  Small left pleural effusion, trace right pleural effusion. 4.  Mediastinal adenopathy.  Images and interpretation personally reviewed by: Eula Listen, MD Exam End: 08/26/22 16:43   Specimen Collected: 08/26/22 16:45 Last Resulted: 08/26/22 16:49  Received From: [facility]    ECHO 08/10/22 (no effusion in Oct 2023)   Sonographer Comments: Image acquisition challenging due to breast  implants.  IMPRESSIONS     1. Left ventricular ejection fraction, by estimation, is 60 to 65%. The  left ventricle has normal function. The left ventricle has no regional  wall motion abnormalities. There is mild left ventricular hypertrophy of  the basal-septal segment. Left  ventricular diastolic parameters are consistent with Grade I diastolic  dysfunction (impaired relaxation).   2. Right ventricular systolic function is normal. The right ventricular  size is normal.   3. Moderate pericardial effusion. There is no evidence of cardiac  tamponade.   4. The mitral valve is normal in structure. Trivial mitral valve  regurgitation. No evidence of mitral stenosis.   5. The aortic valve is tricuspid. Aortic valve regurgitation is trivial.  No aortic stenosis is present.   6. The inferior vena cava is normal in size with greater than 50%  respiratory variability, suggesting right atrial pressure of 3 mmHg.    Comparison(s): No prior Echocardiogram.    OV 10/24/2022  Subjective:  Patient ID: [name], female , DOB: 22-Dec-1950 , age 57 y.o. , MRN: [id number] , ADDRESS: [address] [place] [address] PCP Copland, Gwenlyn Found, MD Patient Care Team: Copland, Gwenlyn Found, MD as PCP - General (Family Medicine) Glendale Chard, DO as Consulting Physician (Neurology) Delsa Grana, MD Ancil Boozer, MD as Referring Physician (Rheumatology) Merrilyn Puma, MD as Referring Physician (Pulmonary Disease) Jerene Bears, MD as Consulting Physician (Gynecology) Durenda Age, MD as Referring Physician (Internal Medicine) Donzetta Starch, MD as Consulting Physician (Dermatology) Jerene Bears, MD as Consulting Physician (Gynecology) Pyrtle, Carie Caddy, MD as Consulting Physician (Gastroenterology) Myna Hidalgo Rose Phi, MD as Medical Oncologist (Oncology)  This Provider for this  visit: Treatment Team:  Attending Provider: Kalman Shan, MD    10/24/2022 -   Chief Complaint  Patient presents with   Follow-up    Review PFT from today   #Scleroderma systemic # ILD associated with scleroderma workup and evaluation progress # Scleroderma renal and respiratory crisis late 2023/early 2024 following hold on CellCept due to shingles  HPI DENENE ALAMILLO 72 y.o. -returns for follow-up.  She presents with her husband.  Husband is an independent historian today.  The following is and amalgamation of her information and review of the chart and also the husband.  I saw her 2 months ago.  Several issues at this visit  -Renal: She has now established with our local nephrology practice with Dr. Arrie Aran.  She saw his nurse practitioner yesterday.  Apparently her creatinine has improved.  Most recent one end of March 2024 was 1.39 mg percent.  She believes it is improved because she is back on CellCept for her scleroderma and also she is on antihypertensives  -Blood pressure: She tried  Norvasc 2.5 mg along with lisinopril.  She felt the combination made her hypotensive.  She attributed most of the drop to the 2.5 mg Norvasc.  But even on full dose lisinopril 40 mg/day her blood pressure was still on the lower side.  She says she gets fatigued with the blood pressure is less than 108 systolic and does better when the blood pressure is over 118 systolic.  Therefore she is reduced her lisinopril to 20 mg/day and with this the blood pressure is in a better range and she is feeling better  -New issue "swollen tongue: She has been noticing that her tongue has been swollen for the last few weeks.  This is definitely after starting lisinopril but she is not associating this with lisinopril.  There is no angioedema of the lips.  When I examined her tongue it looked normal but she says it is swollen.  Swallowing pills has become harder.  I did indicate to her that swollen tongue is a side effect of lisinopril.  We discussed about Norvasc being a better agent for blood pressure control particularly with her and not.  However she feels that she may be sensitive to the Norvasc.  She is also worried about stopping her lisinopril due to fear of renal compromise.  Finally took a shared decision making for her to stop lisinopril for 1 week and see if the swollen tongue will improve.  Also indicated to her that she could continue the lisinopril but continue to monitor her tongue  -Cough: This is significantly improved after reducing the lisinopril and stopping Valtrex.  But she still has cough that when it happens is at least moderate in severity   -Shingles: She stopped the Valtrex because of cough and has been no recurrence in shingles.  She is also back on the CellCept.  The situation is being monitored  -Scleroderma: She does not have a local rheumatologist.  Rheumatology support is through Dr. Sherryll Burger at [facility] and also Dr. Rica Mast at [facility].  Dr. Rica Mast and I plan to have a conference call on  10/25/2022.  She is currently on CellCept 50 mg twice daily and is tolerating it well  # ILD: She never been given a formal diagnosis of ILD but she does have it.  I had Dr. Allegra Lai review her CT scan of the chest.  She agrees that patient has ILD at least at the 10-20% level.  There  is also some pulmonary edema.  Currently most recent pulmonary function test is improved significantly but the overall trend Is 1 of decline particularly this happene afterd she stopped her CellCept in 2023 (see below].  I did indicate to her nintedanib is indicated.  She told me she is sensitive to medications.  I went over the side effect profile of diarrhea, liver function test monitoring rare heart attack diverticulitis.  We took a shared decision making to start the low-dose but pending conversation with Dr. Rica Mast at [facility]. ->  The following day on Friday I did speak to his Dr. Rica Mast at [facility].  He wants to hold off on the nintedanib pending his conversation with the rheumatologist at [facility].  They want to consider IVIG to improve lung function further before starting patient on nintedanib.  He is going to get back to me.  # At risk pulmonary hypertension:-Dr. Madelynn Done to do a right heart catheterization there is no tamponade.  Main pulmonary artery pressure was 24 suggestive and consistent with very mild pulmonary hypertension..For treatment for WHO group 3 pulmonary hypertension his mean pulmonary pressure 25 which is just 1 point higher.  Given the pressing issue of ILD decided to postpone pulmonary hypertension treatment.  I will confirm alignment with Dr. Rica Mast at [facility] -> I did talk to him on the phone following this.  He is supportive and aligned with this plan.  #Pericardial effusion: Dr. Madelynn Done feels it is inflammatory and will use with treatment this should resolve.   P Preliminary result   [facility] MID- MAR 2024   Findings:   RA = 6 RV = 39/12 PA = 39/10 (24) PCW = 6 Fick cardiac  output/index = 5.7/3.4 PVR = 3.2 WU Ao sat = 99% PA sat = 74%, 77% SVC sat 75%   Assessment:   Very mild PAH.    Plan/Discussion:    Given scleroderma, will discuss trial of ERA or Tyvaso with Dr. Marchelle Gearing.   Arvilla Meres, MD   OV 11/12/2022  Subjective:  Patient ID: [name], female , DOB: [birth year] , age 19 y.o. , MRN: [id number] , ADDRESS: [address] [place] [address] PCP Copland, Gwenlyn Found, MD Patient Care Team: Copland, Gwenlyn Found, MD as PCP - General (Family Medicine) Glendale Chard, DO as Consulting Physician (Neurology) Delsa Grana, MD Ancil Boozer, MD as Referring Physician (Rheumatology) Merrilyn Puma, MD as Referring Physician (Pulmonary Disease) Jerene Bears, MD as Consulting Physician (Gynecology) Durenda Age, MD as Referring Physician (Internal Medicine) Donzetta Starch, MD as Consulting Physician (Dermatology) Jerene Bears, MD as Consulting Physician (Gynecology) Pyrtle, Carie Caddy, MD as Consulting Physician (Gastroenterology) Myna Hidalgo Rose Phi, MD as Medical Oncologist (Oncology)  This Provider for this visit: Treatment Team:  Attending Provider: Kalman Shan, MD    11/12/2022 -   Chief Complaint  Patient presents with   Follow-up    F/up     Type of visit: Video Virtual Visit Identification of patient SHONIKA KOLASINSKI with [birth year] and MRN [id number] - 2 person identifier Risks: Risks, benefits, limitations of telephone visit explained. Patient understood and verbalized agreement to proceed Anyone else on call: patient Patient location: her home This provider location: [address], [address]; [place]; [place] [address]. [facility]. [phone]     HPI LARKYN GREENBERGER 72 y.o. -patient on video visit to catch up  SSc/ILD - stable  / Dw Dr Thereasa Solo of [facility] over telephine ->  she is worried about myopathy. her CK continues to be high.  It is in the 500s.And is recommending  IvIG. Her impression is that Dr Durenda Age at [facility] has made plans to get in [place] through her patient hematologist. DR Sherryll Burger is asking for repeat FVC and MIP/NIF.  Patient is going to call [employer] and figure this out.   Tongue swelling: still remain on lisinprol 20mg  per day . Was advised to strt B12 and this helped. This was based on based on advise given by daughter friends.  BP: She is off norvasc. Is now on lisinopril 20mg  per day. SBP 91 and sometimes sbp 80s  . Scared to cut down on lisinopril because of beneifical effects to kidney. Says low BP is making her ddizzy and fatigued and difficult to carry on.d/w Dr Martyn Malay o  [facility] via phone =- > she said for [place] to take BP and if still low -> then reduce lisinopril to 10mg  per day  Renal: creat 1.38g% on 10/28/22 but after that with [facility] is 1.34mg %   Left diaph - sniff test: Normal   SYMPTOM SCALE - ILD 09/05/2022  Current weight   O2 use ra  Shortness of Breath 0 -> 5 scale with 5 being worst (score 6 If unable to do)  At rest 1  Simple tasks - showers, clothes change, eating, shaving 1  Household (dishes, doing bed, laundry) 2  Shopping x  Walking level at own pace 1  Walking up Stairs 2  Total (30-36) Dyspnea Score 7      Non-dyspnea symptoms (0-> 5 scale) 09/05/2022  How bad is your cough? 3  How bad is your fatigue 3  How bad is nausea 3  How bad is vomiting?  0  How bad is diarrhea? 0  How bad is anxiety? 0  How bad is depression 0  Any chronic pain - if so where and how bad 1     Simple office walk 185 feet x  3 laps goal with forehead probe 09/05/2022  10/24/2022   O2 used ra   Number laps completed 3   Comments about pace pace   Resting Pulse Ox/HR 100% and 87/min   Final Pulse Ox/HR 97% and 107/min   Desaturated </= 88% no   Desaturated <= 3% points yes   Got Tachycardic >/= 90/min yes   Symptoms at end of test x   Miscellaneous comments x        PFT data for ILD 12/23/2017 2.25/2020  03/07/2020 11/14/20 [place] 05/15/21 05/14/2022 at [facility]  08/27/22 at [facility]  In the context of volume overload and hospitalization 10/23/2022   FVC 2.82 2.92 2.88 2.77 2.75 2.05 1.59 1.73  FVC %      73% 60% 58%  Ratio      77 76   TLC   3.93  4.34 3.09 2.41   TLC %      62% 51%   DLCO 19.2 10.67 11.68 12.29 12.82 10.2 5.38 9.27  DLCO %      52% 29% 47%       PFT     Latest Ref Rng & Units 10/23/2022    4:24 PM  PFT Results  FVC-Pre L 1.73   FVC-Predicted Pre % 58   Pre FEV1/FVC % % 85   FEV1-Pre L 1.47   FEV1-Predicted Pre % 65   DLCO uncorrected ml/min/mmHg 8.78   DLCO UNC% % 44   DLCO corrected  ml/min/mmHg 9.27   DLCO COR %Predicted % 47   DLVA Predicted % 81       Latest Reference Range & Units 09/04/22 10:43 09/12/22 10:45 09/19/22 10:22 09/25/22 09:51 10/09/22 10:54  CK Total 7 - 177 U/L 475 (H) 551 (H) 534 (H) 498 (H) 575 (H)  (H): Data is abnormally high    has a past medical history of Anemia, Breastr cancer, IDC, Left UOQ, clinical stage II Receptor +, Her 2 - (08/29/2011 DX), Colon polyp, Contracture of hand, Depression, Esophageal stricture, GERD (gastroesophageal reflux disease), Hiatal hernia, History of external beam radiation therapy (11-25-2011 to 01-08-2012), Hyperlipidemia, Hypertension, Hypothyroidism, Internal hemorrhoids, Osteoarthritis, Osteopenia, Positive ANA (antinuclear antibody), Rash, Raynaud's syndrome, Scleroderma involving lung (HCC) (pulmologist-  dr h. Gaynell Face at [facility]), Skin thickening, Systemic sclerosis (HCC), and UTI (urinary tract infection).   reports that she has never smoked. She has never used smokeless tobacco.  Past Surgical History:  Procedure Laterality Date   BIOPSY  01/29/2018   Procedure: BIOPSY;  Surgeon: Beverley Fiedler, MD;  Location: [facility];  Service: Gastroenterology;;   Arbutus Leas  2000   Left   COLONOSCOPY WITH PROPOFOL N/A 01/29/2018   Procedure: COLONOSCOPY WITH PROPOFOL;  Surgeon: Beverley Fiedler, MD;  Location: [facility];  Service: Gastroenterology;  Laterality: N/A;   DILATATION & CURETTAGE/HYSTEROSCOPY WITH MYOSURE N/A 08/04/2017   Procedure: DILATATION & CURETTAGE/HYSTEROSCOPY WITH MYOSURE;  Surgeon: Jerene Bears, MD;  Location: [facility];  Service: Gynecology;  Laterality: N/A;   ECTOPIC PREGNANCY SURGERY  1986-88   s/p unilateral salpingectomy   ESOPHAGOGASTRODUODENOSCOPY (EGD) WITH PROPOFOL N/A 01/29/2018   Procedure: ESOPHAGOGASTRODUODENOSCOPY (EGD) WITH PROPOFOL;  Surgeon: Beverley Fiedler, MD;  Location: [facility];  Service: Gastroenterology;  Laterality: N/A;   PARTIAL MASTECTOMY WITH AXILLARY SENTINEL LYMPH NODE BIOPSY Left 11-04-2011   dr Jamey Ripa  [facility]   RIGHT HEART CATH N/A 10/01/2022   Procedure: RIGHT HEART CATH;  Surgeon: Dolores Patty, MD;  Location: [facility] INVASIVE CV LAB;  Service: Cardiovascular;  Laterality: N/A;   TRANSTHORACIC ECHOCARDIOGRAM  06-04-2017    [facility]   moderate LVH, ef>55%/  trivial AR and TR/ mild MR and PR/  trivial pericardial effusion   WISDOM TOOTH EXTRACTION      Allergies  Allergen Reactions   Betadine [Povidone Iodine] Rash   Contrast Media [Iodinated Contrast Media] Rash and Other (See Comments)    "per pt: recently had CT 08/ 2018 even through given pre-medication, IVP still caused rash"   Epinephrine Other (See Comments)    Severe headaches    Iodine Rash and Other (See Comments)   Oxycodone Anxiety   Prednisone Anaphylaxis    Per Nephrology pt should avoid this medication because she has Scleroderma and it will interfere with her Kidneys,    Gabapentin Other (See Comments)    Dizziness   Lidocaine Rash   Nickel Rash and Other (See Comments)    Rash and blisters   Other Rash and Other (See Comments)    Hospital gown   Penicillins Rash and Other (See Comments)    Rash and blisters Has patient had a PCN reaction causing immediate rash, facial/tongue/throat swelling, SOB or lightheadedness with hypotension:  Yes Has patient had a PCN reaction causing severe rash involving mucus membranes or skin necrosis: No Has patient had a PCN reaction that required hospitalization: No Has patient had a PCN reaction occurring within the last 10 years: No If all of the above answers are "NO", then  may proceed with Cephalosporin use.    Sulfa Antibiotics Rash    Immunization History  Administered Date(s) Administered   Tdap 04/12/2008, 12/28/2018   Zoster, Live 08/16/2011    Family History  Problem Relation Age of Onset   Heart disease Mother    Heart failure Mother    Heart disease Father    Ovarian cancer Maternal Aunt    Heart disease Paternal Uncle        multiple   Autoimmune disease Sister    Arrhythmia Sister    Anemia Sister    CAD Brother    Colon cancer Neg Hx    Stomach cancer Neg Hx    Esophageal cancer Neg Hx    Rectal cancer Neg Hx      Current Outpatient Medications:    acetaminophen (TYLENOL) 325 MG tablet, Take 2 tablets (650 mg total) by mouth every 6 (six) hours as needed for mild pain or headache., Disp: , Rfl:    acetaminophen (TYLENOL) 650 MG CR tablet, Take 650 mg by mouth daily as needed for pain., Disp: , Rfl:    ELDERBERRY PO, Take 1 oz by mouth 2 (two) times daily. Liquid, Disp: , Rfl:    furosemide (LASIX) 20 MG tablet, Take 20 mg by mouth daily., Disp: , Rfl:    levothyroxine (SYNTHROID) 100 MCG tablet, Take 1 tablet (100 mcg total) by mouth daily. (Patient taking differently: Take 125 mcg by mouth daily.), Disp: 90 tablet, Rfl: 3   lisinopril (ZESTRIL) 40 MG tablet, Take 1 tablet (40 mg total) by mouth daily. (Patient taking differently: Take 20 mg by mouth daily.), Disp: 30 tablet, Rfl: 3   Multiple Vitamin (MULTIVITAMIN) capsule, Take 1 capsule by mouth daily., Disp: , Rfl:    mycophenolate (CELLCEPT) 500 MG tablet, Take 1,500 mg by mouth 2 (two) times daily., Disp: , Rfl:    pantoprazole (PROTONIX) 40 MG tablet, Take 40 mg by mouth 2 (two) times daily., Disp: ,  Rfl:    sodium chloride (OCEAN) 0.65 % SOLN nasal spray, Place 1 spray into both nostrils as needed for congestion., Disp: , Rfl:    SYNTHROID 125 MCG tablet, Take 125 mcg by mouth daily before breakfast. *Must be the brand only*, Disp: , Rfl:    amLODipine (NORVASC) 2.5 MG tablet, Take 2.5 mg by mouth daily. (Patient not taking: Reported on 10/24/2022), Disp: , Rfl:    valACYclovir (VALTREX) 500 MG tablet, Take 1 tablet (500 mg total) by mouth daily. (Patient not taking: Reported on 10/24/2022), Disp: 30 tablet, Rfl: 3      Objective:   Vitals:   11/12/22 1632  Weight: 133 lb 3.2 oz (60.4 kg)  Height: 5' 4.5" (1.638 m)    Estimated body mass index is 22.51 kg/m as calculated from the following:   Height as of this encounter: 5' 4.5" (1.638 m).   Weight as of this encounter: 133 lb 3.2 oz (60.4 kg).  @WEIGHTCHANGE @  [employer]   11/12/22 1632  Weight: 133 lb 3.2 oz (60.4 kg)     Physical Exam    General: No distress. Looks well Neuro: Alert and Oriented x 3. GCS 15. Speech normal Psych: Pleasant  Ext: Stigmata of Connective Tissue Disease - SSC HEENT: Normal upper airway. PEERL +. No post nasal drip        Assessment:       ICD-10-CM   1. Interstitial lung disease due to connective tissue disease (HCC)  J84.89    M35.9  2. 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 Elevated CK  R74.8     4. 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 Hypotension, unspecified hypotension type  I95.9          Plan:     Patient Instructions     ICD-10-CM   1. Interstitial lung disease due to connective tissue disease (HCC)  J84.89    M35.9     2. 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 Elevated CK  R74.8     4. 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 Hypotension, unspecified hypotension type  I95.9      Scleroderma interstitial lung disease  -Clinically stable on restart of CellCept  Plan  - CellCept directives by Abbe Amsterdam and [facility] [employer]  Myopathy/elevated CK  Plan  -  IVIG given locally in [facility] through your hematologist as directed by [place]  -Our office will set up repeat spirometry FVC and muscle strength testing  Acute scleroderma renal crisis [facility]) -January 2024 in the setting of stopping CellCept because of shingles  -Creatinine for your history is improving once you have gone back on CellCept and controlling her blood pressure but most recently in April 2024 it is stabilized to 1.38 mg percent  Plan -According to renal  Hypotension  -Despite stopppin amlodipine blood pressure is low on lisinopril 20 mg/day.  Plan  - Discussed with Dr. Sherryll Burger:, Check your blood pressure in our office and if it is still low then reduce your lisinopril to 10 mg/day.   Swollen tongue  -Glad this is resolved after taking B12 Plan  - According to primary care   pericardial effusion  - deemed small and inflammatory by Dr Gala Romney  Plan  - per Dr Lucas Mallow   Follow-up - 4-week face-to-face visit with Dr. Marchelle Gearing   ( Level 05 visit: Estb 40-54 min  in  visit type: on-site physical face to visit  in total care time and counseling or/and coordination of care by this undersigned MD - Dr Kalman Shan. This includes one or more of the following on this same day 11/12/2022: pre-charting, chart review, note writing, documentation discussion of test results, diagnostic or treatment recommendations, prognosis, risks and benefits of management options, instructions, education, compliance or risk-factor reduction. It excludes time spent by the CMA or office staff in the care of the patient. Actual time 45 min)    SIGNATURE    Dr. Kalman Shan, M.D., F.C.C.P,  Pulmonary and Critical Care Medicine Staff Physician, [facility] Center Director - Interstitial Lung Disease  Program  Pulmonary Fibrosis [facility] at [facility] [place], [place], [address]  Pager: [phone], If no answer or between  15:00h - 7:00h:  call [phone] Telephone: [phone]  5:47 PM 11/12/2022

## 2022-11-12 NOTE — Patient Instructions (Addendum)
ICD-10-CM   1. Interstitial lung disease due to connective tissue disease (HCC)  J84.89    M35.9     2. 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 Elevated CK  R74.8     4. 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 Hypotension, unspecified hypotension type  I95.9      Scleroderma interstitial lung disease  -Clinically stable on restart of CellCept  Plan  - CellCept directives by Abbe Amsterdam and [facility] [employer]  Myopathy/elevated CK  Plan  - IVIG given locally in [facility] through your hematologist as directed by [place] -Our office will set up repeat spirometry FVC and muscle strength testing  Acute scleroderma renal crisis [facility]) -January 2024 in the setting of stopping CellCept because of shingles  -Creatinine for your history is improving once you have gone back on CellCept and controlling her blood pressure but most recently in April 2024 it is stabilized to 1.38 mg percent  Plan -According to renal  Hypotension  -Despite stopppin amlodipine blood pressure is low on lisinopril 20 mg/day.  Plan  - Discussed with Dr. Sherryll Burger:, Check your blood pressure in our office and if it is still low then reduce your lisinopril to 10 mg/day.   Swollen tongue  -Glad this is resolved after taking B12 Plan  - According to primary care   pericardial effusion  - deemed small and inflammatory by Dr Gala Romney  Plan  - per Dr Lucas Mallow   Follow-up - 4-week face-to-face visit with Dr. Marchelle Gearing

## 2022-11-12 NOTE — Telephone Encounter (Signed)
[name]  Plse refer [name] for Pulmonary Rehab for ILD     SIGNATURE    Dr. Kalman Shan, M.D., F.C.C.P,  Pulmonary and Critical Care Medicine Staff Physician, [facility] Center Director - Interstitial Lung Disease  Program  Medical Director - [facility] Pulmonary Fibrosis [facility] at [facility] [place], [place], [address]   Pager: [phone], If no answer  -> Check AMION or Try [phone] Telephone (clinical office): [phone] Telephone (research): [phone]  5:17 PM 11/12/2022

## 2022-11-12 NOTE — Telephone Encounter (Signed)
[name]  Please have her come in sometime this week and she needs to have a blood pressure check professionally by somebody in the office.  This needs to be documented and sent to me in a phone note.  This is per directions from [facility] Please arrange for spirometry [no need for DLCO] and NIF and MEP -in the pulmonary function test.  Sometimes this needs to be done in the hospital Please arrange a face-to-face visit 30 minutes with me in 4 weeks

## 2022-11-13 DIAGNOSIS — M3489 Other systemic sclerosis: Secondary | ICD-10-CM | POA: Diagnosis not present

## 2022-11-13 DIAGNOSIS — R208 Other disturbances of skin sensation: Secondary | ICD-10-CM | POA: Diagnosis not present

## 2022-11-13 DIAGNOSIS — M79645 Pain in left finger(s): Secondary | ICD-10-CM | POA: Diagnosis not present

## 2022-11-13 DIAGNOSIS — R29898 Other symptoms and signs involving the musculoskeletal system: Secondary | ICD-10-CM | POA: Diagnosis not present

## 2022-11-13 DIAGNOSIS — R600 Localized edema: Secondary | ICD-10-CM | POA: Diagnosis not present

## 2022-11-13 DIAGNOSIS — M79644 Pain in right finger(s): Secondary | ICD-10-CM | POA: Diagnosis not present

## 2022-11-13 DIAGNOSIS — M349 Systemic sclerosis, unspecified: Secondary | ICD-10-CM | POA: Diagnosis not present

## 2022-11-13 DIAGNOSIS — M25642 Stiffness of left hand, not elsewhere classified: Secondary | ICD-10-CM | POA: Diagnosis not present

## 2022-11-13 DIAGNOSIS — M79641 Pain in right hand: Secondary | ICD-10-CM | POA: Diagnosis not present

## 2022-11-13 DIAGNOSIS — M79642 Pain in left hand: Secondary | ICD-10-CM | POA: Diagnosis not present

## 2022-11-13 DIAGNOSIS — M25641 Stiffness of right hand, not elsewhere classified: Secondary | ICD-10-CM | POA: Diagnosis not present

## 2022-11-13 MED ORDER — LISINOPRIL 10 MG PO TABS: 10.0000 mg | ORAL_TABLET | Freq: Every day | ORAL | 3 refills | Status: DC

## 2022-11-13 NOTE — Telephone Encounter (Signed)
Pls see action items forom video visit yesterday. She neds spirometry and NIF/MEP. IF cannot be done ato our office needs be done at hospital. Also, DLCO if possible  She laso needs to come into our office and get BP checkec

## 2022-11-13 NOTE — Addendum Note (Signed)
Addended by: Hedda Slade on: 11/13/2022 03:01 PM   Modules accepted: Orders

## 2022-11-13 NOTE — Telephone Encounter (Signed)
Referral to pulmonary rehab has been placed.nothing further needed.

## 2022-11-14 ENCOUNTER — Ambulatory Visit (INDEPENDENT_AMBULATORY_CARE_PROVIDER_SITE_OTHER): Payer: Medicare Other | Admitting: *Deleted

## 2022-11-14 VITALS — Ht 64.5 in | Wt 133.0 lb

## 2022-11-14 DIAGNOSIS — Z0000 Encounter for general adult medical examination without abnormal findings: Secondary | ICD-10-CM | POA: Diagnosis not present

## 2022-11-14 NOTE — Progress Notes (Signed)
Subjective:   [name] is a 72 y.o. female who presents for an Initial Medicare Annual Wellness Visit.  I connected with  [name] on 11/14/22 by a audio enabled telemedicine application and verified that I am speaking with the correct person using two identifiers.  Patient Location: Home  Provider Location: Office/Clinic  I discussed the limitations of evaluation and management by telemedicine. The patient expressed understanding and agreed to proceed.   Review of Systems     Cardiac Risk Factors include: advanced age (>13men, >1 women);dyslipidemia;hypertension     Objective:    Today's Vitals   11/14/22 1434  Weight: 133 lb (60.3 kg)  Height: 5' 4.5" (1.638 m)   Body mass index is 22.48 kg/m.     11/14/2022    2:20 PM 10/01/2022    6:07 AM 08/09/2022    5:00 PM 03/21/2022    1:46 PM 02/20/2022   11:03 AM 01/29/2018    8:33 AM 09/04/2017    1:58 PM  Advanced Directives  Does Patient Have a Medical Advance Directive? Yes Yes Yes Yes Yes Yes Yes  Type of Estate agent of [place];Living will Healthcare Power of [place];Living will Living will;Healthcare Power of [place];Out of facility DNR (pink MOST or yellow form) Living will;Healthcare Power of Attorney Living will;Healthcare Power of [employer] Power of [place];Living will Healthcare Power of [place];Living will  Does patient want to make changes to medical advance directive? No - Patient declined  No - Patient declined No - Patient declined No - Patient declined    Copy of Healthcare Power of Attorney in Chart? Yes - validated most recent copy scanned in chart (See row information)   No - copy requested  No - copy requested     Current Medications (verified) Outpatient Encounter Medications as of 11/14/2022  Medication Sig   acetaminophen (TYLENOL) 325 MG tablet Take 2 tablets (650 mg total) by mouth every 6 (six) hours as needed for mild pain or headache.    acetaminophen (TYLENOL) 650 MG CR tablet Take 650 mg by mouth daily as needed for pain.   ELDERBERRY PO Take 1 oz by mouth 2 (two) times daily. Liquid   furosemide (LASIX) 20 MG tablet Take 20 mg by mouth daily.   levothyroxine (SYNTHROID) 100 MCG tablet Take 1 tablet (100 mcg total) by mouth daily. (Patient taking differently: Take 125 mcg by mouth daily.)   lisinopril (ZESTRIL) 10 MG tablet Take 1 tablet (10 mg total) by mouth daily.   Multiple Vitamin (MULTIVITAMIN) capsule Take 1 capsule by mouth daily.   mycophenolate (CELLCEPT) 500 MG tablet Take 1,500 mg by mouth 2 (two) times daily.   pantoprazole (PROTONIX) 40 MG tablet Take 40 mg by mouth 2 (two) times daily.   sodium chloride (OCEAN) 0.65 % SOLN nasal spray Place 1 spray into both nostrils as needed for congestion.   SYNTHROID 125 MCG tablet Take 125 mcg by mouth daily before breakfast. *Must be the brand only*   [DISCONTINUED] amLODipine (NORVASC) 2.5 MG tablet Take 2.5 mg by mouth daily. (Patient not taking: Reported on 10/24/2022)   [DISCONTINUED] valACYclovir (VALTREX) 500 MG tablet Take 1 tablet (500 mg total) by mouth daily. (Patient not taking: Reported on 10/24/2022)   No facility-administered encounter medications on file as of 11/14/2022.    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, ER/PR+, HER2 negative -- 11-04-2011  left partial mastectomy w/ sln dissection-- completed radiation 01-08-2012-- started tamoxifen 07/ 2013-- per lov note 11/ 2018 no recurrence   Colon polyp    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 Gaynell Face at [facility]   systemic sclerosis , diffuse/   rheumotologist:  dr Sherryll Burger at [facility]---  lov 07-22-2017 (as of 07-25-2017 note not available to read)---  per pt has appt. w/ specialist at [facility]   Skin thickening    hands, arms, legs  caused by scleroderma   Systemic sclerosis (HCC)    UTI (urinary tract infection)    Past Surgical History:  Procedure Laterality Date   BIOPSY  01/29/2018   Procedure: BIOPSY;  Surgeon: Beverley Fiedler, MD;  Location: [facility];  Service: Gastroenterology;;   Arbutus Leas  2000   Left   COLONOSCOPY WITH PROPOFOL N/A 01/29/2018   Procedure: COLONOSCOPY WITH PROPOFOL;  Surgeon: Beverley Fiedler, MD;  Location: [facility];  Service: Gastroenterology;  Laterality: N/A;   DILATATION & CURETTAGE/HYSTEROSCOPY WITH MYOSURE N/A 08/04/2017   Procedure: DILATATION & CURETTAGE/HYSTEROSCOPY WITH MYOSURE;  Surgeon: Jerene Bears, MD;  Location: [facility];  Service: Gynecology;  Laterality: N/A;   ECTOPIC PREGNANCY SURGERY  1986-88   s/p unilateral salpingectomy   ESOPHAGOGASTRODUODENOSCOPY (EGD) WITH PROPOFOL N/A 01/29/2018   Procedure: ESOPHAGOGASTRODUODENOSCOPY (EGD) WITH PROPOFOL;  Surgeon: Beverley Fiedler, MD;  Location: [facility];  Service: Gastroenterology;  Laterality: N/A;   PARTIAL MASTECTOMY WITH AXILLARY SENTINEL LYMPH NODE BIOPSY Left 11-04-2011   dr Jamey Ripa  [facility]   RIGHT HEART CATH N/A 10/01/2022    Procedure: RIGHT HEART CATH;  Surgeon: Dolores Patty, MD;  Location: [facility] INVASIVE CV LAB;  Service: Cardiovascular;  Laterality: N/A;   TRANSTHORACIC ECHOCARDIOGRAM  06-04-2017    [facility]   moderate LVH, ef>55%/  trivial AR and TR/ mild MR and PR/  trivial pericardial effusion   WISDOM TOOTH EXTRACTION     Family History  Problem Relation Age of Onset   Heart disease Mother    Heart failure Mother    Heart disease Father    Ovarian cancer Maternal Aunt    Heart disease Paternal Uncle        multiple   Autoimmune disease Sister    Arrhythmia Sister    Anemia Sister    CAD Brother    Colon cancer Neg Hx    Stomach cancer Neg Hx    Esophageal cancer Neg Hx    Rectal cancer Neg Hx    Social History   Socioeconomic History   Marital  status: Married    Spouse name: Not on file   Number of children: 1   Years of education: 16   Highest education level: Not on file  Occupational History   Occupation: self employed  Tobacco Use   Smoking status: Never   Smokeless tobacco: Never  Vaping Use   Vaping Use: Never used  Substance and Sexual Activity   Alcohol use: Yes    Comment: socially   Drug use: No   Sexual activity: Yes    Birth control/protection: Post-menopausal  Other Topics Concern   Not on file  Social History Narrative   Lives with husband in a 2 story home.  Has 1 daughter.     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     Education: college.    Social Determinants of Health   Financial Resource Strain: Low Risk  (11/14/2022)   Overall Financial Resource Strain (CARDIA)    Difficulty of Paying Living Expenses: Not hard at all  Food Insecurity: No Food Insecurity (08/09/2022)   Hunger Vital Sign    Worried About Running Out of Food in the Last Year: Never true    Ran Out of Food in the Last Year: Never true  Transportation Needs: No Transportation Needs (08/09/2022)   PRAPARE - Administrator, Civil Service (Medical): No    Lack of Transportation  (Non-Medical): No  Physical Activity: Inactive (11/14/2022)   Exercise Vital Sign    Days of Exercise per Week: 0 days    Minutes of Exercise per Session: 0 min  Stress: No Stress Concern Present (11/14/2022)   [employer] of Occupational Health - Occupational Stress Questionnaire    Feeling of Stress : Not at all  Social Connections: Socially Integrated (11/14/2022)   Social Connection and Isolation Panel [NHANES]    Frequency of Communication with Friends and Family: More than three times a week    Frequency of Social Gatherings with Friends and Family: More than three times a week    Attends Religious Services: 1 to 4 times per year    Active Member of [employer] or Organizations: Yes    Attends Engineer, structural: More than 4 times per year    Marital Status: Married    Tobacco Counseling Counseling given: Not Answered   Clinical Intake:  Pre-visit preparation completed: Yes  Pain : No/denies pain  BMI - recorded: 22.48 Nutritional Status: BMI of 19-24  Normal Nutritional Risks: None Diabetes: No  How often do you need to have someone help you when you read instructions, pamphlets, or other written materials from your doctor or pharmacy?: 1 - Never  Activities of Daily Living    11/14/2022    2:31 PM 08/09/2022    5:00 PM  In your present state of health, do you have any difficulty performing the following activities:  Hearing? 0 0  Vision? 0 0  Difficulty concentrating or making decisions? 0 0  Walking or climbing stairs? 0 1  Comment  when having flare ups  Dressing or bathing? 0 1  Doing errands, shopping? 0 0  Preparing Food and eating ? N   Using the Toilet? N   Managing your Medications? N   Managing your Finances? N   Housekeeping or managing your Housekeeping? N     Patient Care Team: Copland, Gwenlyn Found, MD as PCP - General (Family Medicine) Glendale Chard, DO as Consulting Physician (Neurology) Delsa Grana, MD Ancil Boozer,  MD as Referring Physician (  Rheumatology) Merrilyn Puma, MD as Referring Physician (Pulmonary Disease) Jerene Bears, MD as Consulting Physician (Gynecology) Durenda Age, MD as Referring Physician (Internal Medicine) Donzetta Starch, MD as Consulting Physician (Dermatology) Jerene Bears, MD as Consulting Physician (Gynecology) Pyrtle, Carie Caddy, MD as Consulting Physician (Gastroenterology) Myna Hidalgo Rose Phi, MD as Medical Oncologist (Oncology)  Indicate any recent Medical Services you may have received from other than [facility] providers in the past year (date may be approximate).     Assessment:   This is a routine wellness examination for Domini.  Hearing/Vision screen No results found.  Dietary issues and exercise activities discussed: Current Exercise Habits: The patient does not participate in regular exercise at present, Exercise limited by: Other - see comments (recent bp problems)   Goals Addressed   None    Depression Screen    11/14/2022    2:30 PM 03/21/2022    1:43 PM 02/02/2021    9:33 AM  PHQ 2/9 Scores  PHQ - 2 Score 0 0 0    Fall Risk    11/14/2022    2:26 PM 03/21/2022    1:43 PM 02/02/2021    9:33 AM 12/16/2016    8:33 AM  Fall Risk   Falls in the past year? 0 0 0 No  Number falls in past yr: 0 0 0   Injury with Fall? 0 0 0   Risk for fall due to : No Fall Risks     Follow up Education provided Falls evaluation completed      FALL RISK PREVENTION PERTAINING TO THE HOME:  Any stairs in or around the home? Yes  If so, are there any without handrails? No  Home free of loose throw rugs in walkways, pet beds, electrical cords, etc? Yes  Adequate lighting in your home to reduce risk of falls? Yes   ASSISTIVE DEVICES UTILIZED TO PREVENT FALLS:  Life alert? No  Use of a cane, walker or w/c? No  Grab bars in the bathroom? Yes  Shower chair or bench in shower?  Built in bench Elevated toilet seat or a handicapped toilet? No   TIMED UP AND GO:  Was the test  performed?  No, audio visit .    Cognitive Function:        11/14/2022    2:35 PM  6CIT Screen  What Year? 0 points  What month? 0 points  What time? 0 points  Count back from 20 2 points  Months in reverse 0 points  Repeat phrase 0 points  Total Score 2 points    Immunizations Immunization History  Administered Date(s) Administered   Tdap 04/12/2008, 12/28/2018   Zoster, Live 08/16/2011    TDAP status: Up to date  Flu Vaccine status: Up to date  Pneumococcal vaccine status: Due, Education has been provided regarding the importance of this vaccine. Advised may receive this vaccine at local pharmacy or Health Dept. Aware to provide a copy of the vaccination record if obtained from local pharmacy or Health Dept. Verbalized acceptance and understanding.  Covid-19 vaccine status: Declined, Education has been provided regarding the importance of this vaccine but patient still declined. Advised may receive this vaccine at local pharmacy or Health Dept.or vaccine clinic. Aware to provide a copy of the vaccination record if obtained from local pharmacy or Health Dept. Verbalized acceptance and understanding.  Qualifies for Shingles Vaccine? Yes   Zostavax completed Yes   Shingrix Completed?: No.    Education has been provided regarding  the importance of this vaccine. Patient has been advised to call insurance company to determine out of pocket expense if they have not yet received this vaccine. Advised may also receive vaccine at local pharmacy or Health Dept. Verbalized acceptance and understanding.  Screening Tests Health Maintenance  Topic Date Due   COVID-19 Vaccine (1) Never done   Hepatitis C Screening  Never done   Zoster Vaccines- Shingrix (1 of 2) Never done   Medicare Annual Wellness (AWV)  10/31/2021   Pneumonia Vaccine 45+ Years old (1 of 1 - PCV) 09/19/2023 (Originally 04/22/2014)   INFLUENZA VACCINE  02/13/2023   COLONOSCOPY (Pts 45-21yrs Insurance coverage will need  to be confirmed)  01/30/2028   DTaP/Tdap/Td (3 - Td or Tdap) 12/27/2028   DEXA SCAN  Completed   HPV VACCINES  Aged Out   MAMMOGRAM  Discontinued    Health Maintenance  Health Maintenance Due  Topic Date Due   COVID-19 Vaccine (1) Never done   Hepatitis C Screening  Never done   Zoster Vaccines- Shingrix (1 of 2) Never done   Medicare Annual Wellness (AWV)  10/31/2021    Colorectal cancer screening: Type of screening: Colonoscopy. Completed 01/29/18. Repeat every 10 years  Patient does not have routine mammography due to her history of scleroderma. Pt has breast MRIs ordered by oncology.  Bone Density screen: Pt declined at this time.  Lung Cancer Screening: (Low Dose CT Chest recommended if Age 49-80 years, 30 pack-year currently smoking OR have quit w/in 15years.) does not qualify.   Additional Screening:  Hepatitis C Screening: does qualify; Completed N/a  Vision Screening: Recommended annual ophthalmology exams for early detection of glaucoma and other disorders of the eye. Is the patient up to date with their annual eye exam?  Yes  Who is the provider or what is the name of the office in which the patient attends annual eye exams? Dr. Blima Ledger If pt is not established with a provider, would they like to be referred to a provider to establish care? No .   Dental Screening: Recommended annual dental exams for proper oral hygiene  Community Resource Referral / Chronic Care Management: CRR required this visit?  No   CCM required this visit?  No      Plan:     I have personally reviewed and noted the following in the patient's chart:   Medical and social history Use of alcohol, tobacco or illicit drugs  Current medications and supplements including opioid prescriptions. Patient is not currently taking opioid prescriptions. Functional ability and status Nutritional status Physical activity Advanced directives List of other physicians Hospitalizations,  surgeries, and ER visits in previous 12 months Vitals Screenings to include cognitive, depression, and falls Referrals and appointments  In addition, I have reviewed and discussed with patient certain preventive protocols, quality metrics, and best practice recommendations. A written personalized care plan for preventive services as well as general preventive health recommendations were provided to patient.   Due to this being a telephonic visit, the after visit summary with patients personalized plan was offered to patient via mail or my-chart. Patient would like to access on my-chart.   Donne Anon, [place]   11/14/2022   Nurse Notes: None

## 2022-11-14 NOTE — Patient Instructions (Signed)
[name] , Thank you for taking time to come for your Medicare Wellness Visit. I appreciate your ongoing commitment to your health goals. Please review the following plan we discussed and let me know if I can assist you in the future.   These are the goals we discussed:  Goals   None     This is a list of the screening recommended for you and due dates:  Health Maintenance  Topic Date Due   COVID-19 Vaccine (1) Never done   Hepatitis C Screening: USPSTF Recommendation to screen - Ages 22-79 yo.  Never done   Zoster (Shingles) Vaccine (1 of 2) Never done   Pneumonia Vaccine (1 of 1 - PCV) 09/19/2023*   Flu Shot  02/13/2023   Medicare Annual Wellness Visit  11/14/2023   Colon Cancer Screening  01/30/2028   DTaP/Tdap/Td vaccine (3 - Td or Tdap) 12/27/2028   DEXA scan (bone density measurement)  Completed   HPV Vaccine  Aged Out   Mammogram  Discontinued  *Topic was postponed. The date shown is not the original due date.     Next appointment: Follow up in one year for your annual wellness visit.   Preventive Care 69 Years and Older, Female Preventive care refers to lifestyle choices and visits with your health care provider that can promote health and wellness. What does preventive care include? A yearly physical exam. This is also called an annual well check. Dental exams once or twice a year. Routine eye exams. Ask your health care provider how often you should have your eyes checked. Personal lifestyle choices, including: Daily care of your teeth and gums. Regular physical activity. Eating a healthy diet. Avoiding tobacco and drug use. Limiting alcohol use. Practicing safe sex. Taking low-dose aspirin every day. Taking vitamin and mineral supplements as recommended by your health care provider. What happens during an annual well check? The services and screenings done by your health care provider during your annual well check will depend on your age, overall health,  lifestyle risk factors, and family history of disease. Counseling  Your health care provider may ask you questions about your: Alcohol use. Tobacco use. Drug use. Emotional well-being. Home and relationship well-being. Sexual activity. Eating habits. History of falls. Memory and ability to understand (cognition). Work and work Astronomer. Reproductive health. Screening  You may have the following tests or measurements: Height, weight, and BMI. Blood pressure. Lipid and cholesterol levels. These may be checked every 5 years, or more frequently if you are over 25 years old. Skin check. Lung cancer screening. You may have this screening every year starting at age 68 if you have a 30-pack-year history of smoking and currently smoke or have quit within the past 15 years. Fecal occult blood test (FOBT) of the stool. You may have this test every year starting at age 56. Flexible sigmoidoscopy or colonoscopy. You may have a sigmoidoscopy every 5 years or a colonoscopy every 10 years starting at age 45. Hepatitis C blood test. Hepatitis B blood test. Sexually transmitted disease (STD) testing. Diabetes screening. This is done by checking your blood sugar (glucose) after you have not eaten for a while (fasting). You may have this done every 1-3 years. Bone density scan. This is done to screen for osteoporosis. You may have this done starting at age 61. Mammogram. This may be done every 1-2 years. Talk to your health care provider about how often you should have regular mammograms. Talk with your health care provider  about your test results, treatment options, and if necessary, the need for more tests. Vaccines  Your health care provider may recommend certain vaccines, such as: Influenza vaccine. This is recommended every year. Tetanus, diphtheria, and acellular pertussis (Tdap, Td) vaccine. You may need a Td booster every 10 years. Zoster vaccine. You may need this after age  61. Pneumococcal 13-valent conjugate (PCV13) vaccine. One dose is recommended after age 18. Pneumococcal polysaccharide (PPSV23) vaccine. One dose is recommended after age 22. Talk to your health care provider about which screenings and vaccines you need and how often you need them. This information is not intended to replace advice given to you by your health care provider. Make sure you discuss any questions you have with your health care provider. Document Released: 07/28/2015 Document Revised: 03/20/2016 Document Reviewed: 05/02/2015 Elsevier Interactive Patient Education  2017 [place] Prevention in the Home Falls can cause injuries. They can happen to people of all ages. There are many things you can do to make your home safe and to help prevent falls. What can I do on the outside of my home? Regularly fix the edges of walkways and driveways and fix any cracks. Remove anything that might make you trip as you walk through a door, such as a raised step or threshold. Trim any bushes or trees on the path to your home. Use bright outdoor lighting. Clear any walking paths of anything that might make someone trip, such as rocks or tools. Regularly check to see if handrails are loose or broken. Make sure that both sides of any steps have handrails. Any raised decks and porches should have guardrails on the edges. Have any leaves, snow, or ice cleared regularly. Use sand or salt on walking paths during winter. Clean up any spills in your garage right away. This includes oil or grease spills. What can I do in the bathroom? Use night lights. Install grab bars by the toilet and in the tub and shower. Do not use towel bars as grab bars. Use non-skid mats or decals in the tub or shower. If you need to sit down in the shower, use a plastic, non-slip stool. Keep the floor dry. Clean up any water that spills on the floor as soon as it happens. Remove soap buildup in the tub or shower  regularly. Attach bath mats securely with double-sided non-slip rug tape. Do not have throw rugs and other things on the floor that can make you trip. What can I do in the bedroom? Use night lights. Make sure that you have a light by your bed that is easy to reach. Do not use any sheets or blankets that are too big for your bed. They should not hang down onto the floor. Have a firm chair that has side arms. You can use this for support while you get dressed. Do not have throw rugs and other things on the floor that can make you trip. What can I do in the kitchen? Clean up any spills right away. Avoid walking on wet floors. Keep items that you use a lot in easy-to-reach places. If you need to reach something above you, use a strong step stool that has a grab bar. Keep electrical cords out of the way. Do not use floor polish or wax that makes floors slippery. If you must use wax, use non-skid floor wax. Do not have throw rugs and other things on the floor that can make you trip. What can I do  with my stairs? Do not leave any items on the stairs. Make sure that there are handrails on both sides of the stairs and use them. Fix handrails that are broken or loose. Make sure that handrails are as long as the stairways. Check any carpeting to make sure that it is firmly attached to the stairs. Fix any carpet that is loose or worn. Avoid having throw rugs at the top or bottom of the stairs. If you do have throw rugs, attach them to the floor with carpet tape. Make sure that you have a light switch at the top of the stairs and the bottom of the stairs. If you do not have them, ask someone to add them for you. What else can I do to help prevent falls? Wear shoes that: Do not have high heels. Have rubber bottoms. Are comfortable and fit you well. Are closed at the toe. Do not wear sandals. If you use a stepladder: Make sure that it is fully opened. Do not climb a closed stepladder. Make sure that  both sides of the stepladder are locked into place. Ask someone to hold it for you, if possible. Clearly mark and make sure that you can see: Any grab bars or handrails. First and last steps. Where the edge of each step is. Use tools that help you move around (mobility aids) if they are needed. These include: Canes. Walkers. Scooters. Crutches. Turn on the lights when you go into a dark area. Replace any light bulbs as soon as they burn out. Set up your furniture so you have a clear path. Avoid moving your furniture around. If any of your floors are uneven, fix them. If there are any pets around you, be aware of where they are. Review your medicines with your doctor. Some medicines can make you feel dizzy. This can increase your chance of falling. Ask your doctor what other things that you can do to help prevent falls. This information is not intended to replace advice given to you by your health care provider. Make sure you discuss any questions you have with your health care provider. Document Released: 04/27/2009 Document Revised: 12/07/2015 Document Reviewed: 08/05/2014 Elsevier Interactive Patient Education  2017 [employer].

## 2022-11-15 DIAGNOSIS — H2513 Age-related nuclear cataract, bilateral: Secondary | ICD-10-CM | POA: Diagnosis not present

## 2022-11-15 DIAGNOSIS — H53143 Visual discomfort, bilateral: Secondary | ICD-10-CM | POA: Diagnosis not present

## 2022-11-15 NOTE — Telephone Encounter (Signed)
Called and spoke to pt, pt agreed to come in on Tuesday morning for blood pressure. I have placed an order for spiro with [facility]. As soon as pt is scheduled for the spiro we will schedule a follow up 4 weeks OV. Pt verbalized understanding.

## 2022-11-17 NOTE — Progress Notes (Signed)
ADVANCED HF CLINIC NOTE  Referring Physician: Dr. Marchelle Gearing Primary Care: Copland, Gwenlyn Found, MD Primary Cardiologist: None Rheumatology: Dr. Sherryll Burger at [facility] and Dr. Martyn Malay @ [facility]  HPI:  [name] is a 72 y/o woman with scleroderma with related ILD, previous breast CA treated with XRT 2013 referred for further evaluation of possible PAH  She was first diagnosed with scleroderma in 2017 based on a high titer ANA and skin thickening. She has been followed previously in the [facility] center and now followed by Dr .Marchelle Gearing. She was started on CellCept in 12/18 with significant improvement in FVC and diffusion capacity. Her CellCept was stopped in January 2023 due to prolonged episode of shingles.   Admitted to [facility] for scleroderma renal crisis in late January 24. BP 225/117. By the time of discharge, she had improvement but not normalization of her creatinine (peak Scr 2.8. down to 1.35). Blood pressure was better controlled. Mycophenolate was added back due to a worsening of her skin disease (now on 1000mg  BID). Since she was discharged from the hospital, she feels like she is slowly improving but continues to have increased edema.  She was seen in Pulmonary at [facility] (2/24) who reviewed data and agreed with continuing Cellcept in setting of worsening lung function   PFT 05/14/2022 [facility]  FEV1/FVC 77  FVC 2.05-73%  FEV1 1.57-73%  TLC 3.09-62%  RV 1.04-49%  RV/TLC normal DLCO 10.20-52%  DLCO/Va   Consistent with mild restriction and moderate reduction in diffusion capacity. These are significantly worse than her PFTs from 2022, when she had an FVC of 2.75 and a DLCO of 12.62   Echo: 1.27.2024 during admit for renal crisis LVEF >60-65% G1 DD RV normal. IVC normal: Moderate pericardial effusion without signs of tamponade  I saw her for first time in 3/24. Here for f/u with her husband. RHC recommended which showed mild PAH. Reports some tongue swelling on lisinopril - which has  improved with B12 supplementation  RA = 6 RV = 39/12 PA = 39/10 (24) PCW = 6 Fick cardiac output/index = 5.7/3.4 PVR = 3.2 WU Ao sat = 99% PA sat = 74%, 77% SVC sat 75%  Here for f/u with her husband. Says she feels poorly. Thinks it is related to low BP. Last week lisinopril decreased to 10 mg. Follow BP closely.  SBP runs 96-129 (most 105-110). Says hgb has dropped from 11 -> 9.3. [facility] and [facility] following her scleroderma. Very fatigued. Having cough and muscle weakness. CKs remain elevated. ESR 65 CRP 7.0 Considering IVIG.   Echo today 11/18/22: EF 65% RV normal. Moderate pericardial effusion. No tamponade.    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 Gaynell Face at [facility]   systemic sclerosis , diffuse/   rheumotologist:  dr Sherryll Burger at [facility]---  lov 07-22-2017 (as of 07-25-2017 note not available to read)---  per pt has appt. w/ specialist at [facility]   Skin thickening    hands, arms, legs  caused by scleroderma   Systemic  sclerosis (HCC)    UTI (urinary tract infection)     Current Outpatient Medications  Medication Sig Dispense Refill   acetaminophen (TYLENOL) 650 MG CR tablet Take 650 mg by mouth daily as needed for pain.     ELDERBERRY PO Take 1 oz by mouth 2 (two) times daily. Liquid     furosemide (LASIX) 20 MG tablet Take 20 mg by mouth daily.     lisinopril (ZESTRIL) 10 MG tablet Take 1 tablet (10 mg total) by mouth daily. 90 tablet 3   Multiple Vitamin (MULTIVITAMIN) capsule Take 1 capsule by mouth daily.     mycophenolate (CELLCEPT) 500 MG tablet Take 1,500 mg by mouth 2 (two) times daily.     pantoprazole (PROTONIX) 40 MG tablet Take 40 mg by mouth 2 (two) times daily.     sodium chloride (OCEAN) 0.65 % SOLN nasal spray Place 1 spray into both nostrils as needed for congestion.     SYNTHROID 125 MCG tablet Take 125 mcg by mouth daily before breakfast. *Must be the brand only*     No current facility-administered medications for this encounter.    Allergies  Allergen Reactions   Betadine [Povidone Iodine] Rash   Contrast Media [Iodinated Contrast Media] Rash and Other (See Comments)    "per pt: recently had CT 08/ 2018 even through given pre-medication, IVP still caused rash"   Epinephrine Other (See Comments)    Severe headaches    Iodine Rash and Other (See Comments)   Oxycodone Anxiety   Prednisone Anaphylaxis    Per Nephrology pt should avoid this medication because she has Scleroderma and it will interfere with her Kidneys,    Gabapentin Other (See Comments)    Dizziness   Lidocaine Rash   Nickel Rash and Other (See Comments)    Rash and blisters   Other Rash and Other (See Comments)    Hospital gown   Penicillins Rash and Other (See Comments)    Rash and blisters Has patient had a PCN reaction causing immediate rash, facial/tongue/throat swelling, SOB or lightheadedness with hypotension: Yes Has patient had a PCN reaction causing severe rash involving mucus membranes or skin  necrosis: No Has patient had a PCN reaction that required hospitalization: No Has patient had a PCN reaction occurring within the last 10 years: No If all of the above answers are "NO", then may proceed with Cephalosporin use.    Sulfa Antibiotics Rash      Social History   Socioeconomic History   Marital status: Married    Spouse name: Not on file   Number of children: 1   Years of education: 16   Highest education level: Not on file  Occupational History   Occupation: self employed  Tobacco Use   Smoking status: Never   Smokeless tobacco: Never  Vaping Use   Vaping Use:  Never used  Substance and Sexual Activity   Alcohol use: Yes    Comment: socially   Drug use: No   Sexual activity: Yes    Birth control/protection: Post-menopausal  Other Topics Concern   Not on file  Social History Narrative   Lives with husband in a 2 story home.  Has 1 daughter.     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     Education: college.    Social Determinants of Health   Financial Resource Strain: Low Risk  (11/14/2022)   Overall Financial Resource Strain (CARDIA)    Difficulty of Paying Living Expenses: Not hard at all  Food Insecurity: No Food Insecurity (08/09/2022)   Hunger Vital Sign    Worried About Running Out of Food in the Last Year: Never true    Ran Out of Food in the Last Year: Never true  Transportation Needs: No Transportation Needs (08/09/2022)   PRAPARE - Administrator, Civil Service (Medical): No    Lack of Transportation (Non-Medical): No  Physical Activity: Inactive (11/14/2022)   Exercise Vital Sign    Days of Exercise per Week: 0 days    Minutes of Exercise per Session: 0 min  Stress: No Stress Concern Present (11/14/2022)   [employer] of Occupational Health - Occupational Stress Questionnaire    Feeling of Stress : Not at all  Social Connections: Socially Integrated (11/14/2022)   Social Connection and Isolation Panel [NHANES]    Frequency of  Communication with Friends and Family: More than three times a week    Frequency of Social Gatherings with Friends and Family: More than three times a week    Attends Religious Services: 1 to 4 times per year    Active Member of [employer] or Organizations: Yes    Attends Engineer, structural: More than 4 times per year    Marital Status: Married  Catering manager Violence: Not At Risk (08/09/2022)   Humiliation, Afraid, Rape, and Kick questionnaire    Fear of Current or Ex-Partner: No    Emotionally Abused: No    Physically Abused: No    Sexually Abused: No      Family History  Problem Relation Age of Onset   Heart disease Mother    Heart failure Mother    Heart disease Father    Ovarian cancer Maternal Aunt    Heart disease Paternal Uncle        multiple   Autoimmune disease Sister    Arrhythmia Sister    Anemia Sister    CAD Brother    Colon cancer Neg Hx    Stomach cancer Neg Hx    Esophageal cancer Neg Hx    Rectal cancer Neg Hx     Vitals:   11/18/22 1103  BP: 108/70  Pulse: 83  SpO2: 100%  Weight: 60.3 kg (133 lb)     PHYSICAL EXAM: General:  Weak appearing. No respiratory difficulty HEENT: normal + telengectasias  Neck: supple. no JVD. Carotids 2+ bilat; no bruits. No lymphadenopathy or thryomegaly appreciated. Cor: PMI nondisplaced. Regular rate & rhythm. No rubs, gallops or murmurs. Lungs: clear Abdomen: soft, nontender, nondistended. No hepatosplenomegaly. No bruits or masses. Good bowel sounds. Extremities: no cyanosis, clubbing, rash, edema Neuro: alert & orientedx3, cranial nerves grossly intact. moves all 4 extremities w/o difficulty. Affect pleasant   ASSESSMENT & PLAN:  1. Scleroderma c/b ILD - Per Rheum and ILD Clinic - Continue mycophenylate - Inflammatory markers still elevated despite CellCept.  Agree with possible IVIG per Rheumatology per [facility]  2. Pericardial effusion - Echo 1/24: Effusion small to moderate. No tamponade - Echo  today 11/18/22: EF 65% RV normal. Moderate pericardial effusion (no change). No tamponade.  - Suspect inflammatory. Inflammatory markers still elevated despite CellCept. 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 In setting of scleroderma would favor treatment with ERA but given pressures only mildly elevated, I do not think this is urgent. Will wait until current Scleroderma flare under better control then consider repeat RHC.  4. ILD - followed by Dr. Marchelle Gearing  5. Scleroderma renal crisis 1/24 - resolved. Remains on mycophenylate and ACE-I - following with Renal  - had some tongue swelling on lisinopril - resolved with B12 supplementation - I d/w Renal will switch lisinopril to losartan 25 qhs (Attempt to keep SBP > 100-130)  Total time spent 45 minutes. Over half that time spent discussing above.   Arvilla Meres, MD  11:30 AM

## 2022-11-18 ENCOUNTER — Inpatient Hospital Stay: Payer: Medicare Other | Attending: Hematology & Oncology

## 2022-11-18 ENCOUNTER — Ambulatory Visit (HOSPITAL_COMMUNITY)
Admission: RE | Admit: 2022-11-18 | Discharge: 2022-11-18 | Disposition: A | Discharge status: Home / Self Care | Payer: Medicare Other | Source: Ambulatory Visit | Attending: Internal Medicine | Admitting: Internal Medicine

## 2022-11-18 ENCOUNTER — Encounter (HOSPITAL_COMMUNITY): Payer: Self-pay | Admitting: Internal Medicine

## 2022-11-18 ENCOUNTER — Encounter: Payer: Self-pay | Admitting: Hematology & Oncology

## 2022-11-18 ENCOUNTER — Inpatient Hospital Stay (HOSPITAL_BASED_OUTPATIENT_CLINIC_OR_DEPARTMENT_OTHER): Payer: Medicare Other | Admitting: Hematology & Oncology

## 2022-11-18 ENCOUNTER — Ambulatory Visit (HOSPITAL_BASED_OUTPATIENT_CLINIC_OR_DEPARTMENT_OTHER)
Admission: RE | Admit: 2022-11-18 | Discharge: 2022-11-18 | Disposition: A | Discharge status: Home / Self Care | Payer: Medicare Other | Source: Ambulatory Visit | Attending: Internal Medicine | Admitting: Internal Medicine

## 2022-11-18 VITALS — BP 108/70 | HR 83 | Wt 133.0 lb

## 2022-11-18 VITALS — BP 112/54 | HR 90 | Temp 98.3°F | Resp 17 | Wt 138.1 lb

## 2022-11-18 DIAGNOSIS — I272 Pulmonary hypertension, unspecified: Secondary | ICD-10-CM

## 2022-11-18 DIAGNOSIS — D631 Anemia in chronic kidney disease: Secondary | ICD-10-CM | POA: Insufficient documentation

## 2022-11-18 DIAGNOSIS — Z923 Personal history of irradiation: Secondary | ICD-10-CM | POA: Insufficient documentation

## 2022-11-18 DIAGNOSIS — M349 Systemic sclerosis, unspecified: Secondary | ICD-10-CM | POA: Diagnosis not present

## 2022-11-18 DIAGNOSIS — R944 Abnormal results of kidney function studies: Secondary | ICD-10-CM | POA: Diagnosis not present

## 2022-11-18 DIAGNOSIS — I3139 Other pericardial effusion (noninflammatory): Secondary | ICD-10-CM | POA: Insufficient documentation

## 2022-11-18 DIAGNOSIS — E785 Hyperlipidemia, unspecified: Secondary | ICD-10-CM | POA: Insufficient documentation

## 2022-11-18 DIAGNOSIS — Z853 Personal history of malignant neoplasm of breast: Secondary | ICD-10-CM | POA: Insufficient documentation

## 2022-11-18 DIAGNOSIS — I083 Combined rheumatic disorders of mitral, aortic and tricuspid valves: Secondary | ICD-10-CM | POA: Insufficient documentation

## 2022-11-18 DIAGNOSIS — I132 Hypertensive heart and chronic kidney disease with heart failure and with stage 5 chronic kidney disease, or end stage renal disease: Secondary | ICD-10-CM | POA: Diagnosis not present

## 2022-11-18 DIAGNOSIS — N1831 Chronic kidney disease, stage 3a: Secondary | ICD-10-CM

## 2022-11-18 DIAGNOSIS — N1832 Chronic kidney disease, stage 3b: Secondary | ICD-10-CM | POA: Diagnosis not present

## 2022-11-18 DIAGNOSIS — K700 Alcoholic fatty liver: Secondary | ICD-10-CM

## 2022-11-18 DIAGNOSIS — Z79899 Other long term (current) drug therapy: Secondary | ICD-10-CM | POA: Diagnosis not present

## 2022-11-18 DIAGNOSIS — Z79624 Long term (current) use of inhibitors of nucleotide synthesis: Secondary | ICD-10-CM | POA: Insufficient documentation

## 2022-11-18 DIAGNOSIS — R0600 Dyspnea, unspecified: Secondary | ICD-10-CM | POA: Insufficient documentation

## 2022-11-18 DIAGNOSIS — I7300 Raynaud's syndrome without gangrene: Secondary | ICD-10-CM | POA: Insufficient documentation

## 2022-11-18 DIAGNOSIS — I119 Hypertensive heart disease without heart failure: Secondary | ICD-10-CM | POA: Insufficient documentation

## 2022-11-18 DIAGNOSIS — R059 Cough, unspecified: Secondary | ICD-10-CM | POA: Diagnosis not present

## 2022-11-18 DIAGNOSIS — J849 Interstitial pulmonary disease, unspecified: Secondary | ICD-10-CM | POA: Diagnosis not present

## 2022-11-18 HISTORY — DX: Chronic kidney disease, stage 3a: N18.31

## 2022-11-18 LAB — CMP (CANCER CENTER ONLY)
ALT: 20 U/L (ref 0–44)
AST: 42 U/L — ABNORMAL HIGH (ref 15–41)
Albumin: 4 g/dL (ref 3.5–5.0)
Alkaline Phosphatase: 52 U/L (ref 38–126)
Anion gap: 9 (ref 5–15)
BUN: 20 mg/dL (ref 8–23)
CO2: 23 mmol/L (ref 22–32)
Calcium: 9.5 mg/dL (ref 8.9–10.3)
Chloride: 103 mmol/L (ref 98–111)
Creatinine: 1.32 mg/dL — ABNORMAL HIGH (ref 0.44–1.00)
GFR, Estimated: 43 mL/min — ABNORMAL LOW (ref >60)
Glucose, Bld: 106 mg/dL — ABNORMAL HIGH (ref 70–99)
Potassium: 3.9 mmol/L (ref 3.5–5.1)
Sodium: 135 mmol/L (ref 135–145)
Total Bilirubin: 0.4 mg/dL (ref 0.3–1.2)
Total Protein: 6.7 g/dL (ref 6.5–8.1)

## 2022-11-18 LAB — VITAMIN B12: Vitamin B-12: 549 pg/mL (ref 180–914)

## 2022-11-18 LAB — CBC WITH DIFFERENTIAL (CANCER CENTER ONLY)
Abs Immature Granulocytes: 0.03 10*3/uL (ref 0.00–0.07)
Basophils Absolute: 0.1 10*3/uL (ref 0.0–0.1)
Basophils Relative: 1 %
Eosinophils Absolute: 0.2 10*3/uL (ref 0.0–0.5)
Eosinophils Relative: 2 %
HCT: 26.9 % — ABNORMAL LOW (ref 36.0–46.0)
Hemoglobin: 8.8 g/dL — ABNORMAL LOW (ref 12.0–15.0)
Immature Granulocytes: 0 %
Lymphocytes Relative: 7 %
Lymphs Abs: 0.7 10*3/uL (ref 0.7–4.0)
MCH: 30.2 pg (ref 26.0–34.0)
MCHC: 32.7 g/dL (ref 30.0–36.0)
MCV: 92.4 fL (ref 80.0–100.0)
Monocytes Absolute: 0.7 10*3/uL (ref 0.1–1.0)
Monocytes Relative: 8 %
Neutro Abs: 7.3 10*3/uL (ref 1.7–7.7)
Neutrophils Relative %: 82 %
Platelet Count: 300 10*3/uL (ref 150–400)
RBC: 2.91 MIL/uL — ABNORMAL LOW (ref 3.87–5.11)
RDW: 15.9 % — ABNORMAL HIGH (ref 11.5–15.5)
WBC Count: 9 10*3/uL (ref 4.0–10.5)
nRBC: 0 % (ref 0.0–0.2)

## 2022-11-18 LAB — ECHOCARDIOGRAM COMPLETE
Area-P 1/2: 3.85 cm2
Calc EF: 58.5 %
S' Lateral: 2.4 cm
Single Plane A2C EF: 60.9 %
Single Plane A4C EF: 58.8 %

## 2022-11-18 LAB — RETICULOCYTES
Immature Retic Fract: 9.3 % (ref 2.3–15.9)
RBC.: 2.95 MIL/uL — ABNORMAL LOW (ref 3.87–5.11)
Retic Count, Absolute: 59.6 10*3/uL (ref 19.0–186.0)
Retic Ct Pct: 2 % (ref 0.4–3.1)

## 2022-11-18 LAB — FERRITIN: Ferritin: 225 ng/mL (ref 11–307)

## 2022-11-18 LAB — SAVE SMEAR(SSMR), FOR PROVIDER SLIDE REVIEW

## 2022-11-18 MED ORDER — LOSARTAN POTASSIUM 25 MG PO TABS: 25.0000 mg | ORAL_TABLET | Freq: Every day | ORAL | 3 refills | Status: DC

## 2022-11-18 NOTE — Progress Notes (Signed)
Hematology and Oncology Follow Up Visit  [name] [id number] 03-25-1951 72 y.o. 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  We last saw her back in November.  Her problem now is that she is having issues with anemia.  She is quite anemic.  Recently, I think she was over at [facility].  She had hypertensive crisis.  I suspect this is probably from her scleroderma.  Her blood pressure was quite high.  She gained quite a bit of weight.  According to her husband, she had about 20 pounds of fluid taken off her.  She has been more anemic.  I suspect this is probably multifactorial.  She has had no obvious bleeding.  She has had no fever.  She has had some nausea.  She says her appetite is not all that great.  She may benefit from some Marinol.  Unfortunately, Marinol is on backorder.  She is also not sleeping all that well.  I recommended that she try some melatonin.  She has had no rashes.  She does have the scleroderma.  Currently, I would have said that her performance status is probably ECOG 1.     Medications:  Current Outpatient Medications:    acetaminophen (TYLENOL) 650 MG CR tablet, Take 650 mg by mouth daily as needed for pain., Disp: , Rfl:    diphenhydrAMINE (BENADRYL) 12.5 MG chewable tablet, Chew 12.5 mg by mouth 4 (four) times daily as needed for allergies., Disp: , Rfl:    ELDERBERRY PO, Take 1 oz by mouth 2 (two) times daily. Liquid, Disp: , Rfl:    furosemide (LASIX) 20 MG tablet, Take 20 mg by mouth daily., Disp: , Rfl:    losartan (COZAAR) 25 MG tablet, Take 1 tablet (25 mg total) by mouth at bedtime., Disp: 90 tablet, Rfl: 3   Multiple Vitamin (MULTIVITAMIN) capsule, Take 1 capsule by mouth daily., Disp: , Rfl:     mycophenolate (CELLCEPT) 500 MG tablet, Take 1,500 mg by mouth 2 (two) times daily., Disp: , Rfl:    pantoprazole (PROTONIX) 40 MG tablet, Take 40 mg by mouth 2 (two) times daily., Disp: , Rfl:    sodium chloride (OCEAN) 0.65 % SOLN nasal spray, Place 1 spray into both nostrils as needed for congestion., Disp: , Rfl:    SYNTHROID 125 MCG tablet, Take 125 mcg by mouth daily before breakfast. *Must be the brand only*, Disp: , Rfl:   Allergies:  Allergies  Allergen Reactions   Betadine [Povidone Iodine] Rash   Contrast Media [Iodinated Contrast Media] Rash and Other (See Comments)    "per pt: recently had CT 08/ 2018 even through given pre-medication, IVP still caused rash"   Epinephrine Other (See Comments)    Severe headaches    Iodine Rash and Other (See Comments)   Oxycodone Anxiety   Prednisone Anaphylaxis    Per Nephrology pt should avoid this medication because she has Scleroderma and it will interfere with her Kidneys,    Gabapentin Other (See Comments)    Dizziness   Lidocaine Rash   Nickel Rash and Other (See Comments)    Rash and blisters   Other Rash and Other (See Comments)    Hospital gown   Penicillins Rash and Other (See Comments)  Rash and blisters Has patient had a PCN reaction causing immediate rash, facial/tongue/throat swelling, SOB or lightheadedness with hypotension: Yes Has patient had a PCN reaction causing severe rash involving mucus membranes or skin necrosis: No Has patient had a PCN reaction that required hospitalization: No Has patient had a PCN reaction occurring within the last 10 years: No If all of the above answers are "NO", then may proceed with Cephalosporin use.    Sulfa Antibiotics Rash    Past Medical History, Surgical history, Social history, and Family History were reviewed and updated.  Review of Systems: Review of Systems  Constitutional:  Positive for fatigue.  HENT:  Negative.    Eyes: Negative.   Respiratory: Negative.     Cardiovascular: Negative.   Gastrointestinal: Negative.   Endocrine: Negative.   Genitourinary: Negative.    Musculoskeletal:  Positive for arthralgias and myalgias.  Skin:  Positive for rash.  Neurological: Negative.   Hematological: Negative.   Psychiatric/Behavioral: Negative.      Physical Exam:  weight is 138 lb 1.9 oz (62.7 kg). Her oral temperature is 98.3 F (36.8 C). Her blood pressure is 112/54 (abnormal) and her pulse is 90. Her respiration is 17 and oxygen saturation is 100%.   Wt Readings from Last 3 Encounters:  11/18/22 138 lb 1.9 oz (62.7 kg)  11/18/22 133 lb (60.3 kg)  11/14/22 133 lb (60.3 kg)    Physical Exam Vitals reviewed.  Constitutional:      Comments: Her breast exam shows right breast with no masses, edema or erythema.  There is no right axillary adenopathy.  Left breast shows the lumpectomy scar at about the 2 o'clock position.  There is little firmness at the lumpectomy site.  No distinct masses noted.  There is no left axillary adenopathy.  There is no left nipple discharge.  HENT:     Head: Normocephalic and atraumatic.  Eyes:     Pupils: Pupils are equal, round, and reactive to light.  Cardiovascular:     Rate and Rhythm: Normal rate and regular rhythm.     Heart sounds: Normal heart sounds.  Pulmonary:     Effort: Pulmonary effort is normal.     Breath sounds: Normal breath sounds.  Abdominal:     General: Bowel sounds are normal.     Palpations: Abdomen is soft.  Musculoskeletal:        General: No tenderness or deformity. Normal range of motion.     Cervical back: Normal range of motion.  Lymphadenopathy:     Cervical: No cervical adenopathy.  Skin:    General: Skin is warm and dry.     Findings: No erythema or rash.     Comments: Skin exam shows some changes of the scleroderma.  This is mostly on the face, around the lip area.  Her arms do show a little bit of skin thickening.  Neurological:     Mental Status: She is alert and  oriented to person, place, and time.  Psychiatric:        Behavior: Behavior normal.        Thought Content: Thought content normal.        Judgment: Judgment normal.      Lab Results  Component Value Date   WBC 9.0 11/18/2022   HGB 8.8 (L) 11/18/2022   HCT 26.9 (L) 11/18/2022   MCV 92.4 11/18/2022   PLT 300 11/18/2022     Chemistry      Component Value Date/Time  NA 135 11/18/2022 1429   NA 144 01/27/2020 1148   NA 141 06/09/2017 1117   K 3.9 11/18/2022 1429   K 3.9 06/09/2017 1117   CL 103 11/18/2022 1429   CL 109 (H) 12/14/2012 1253   CO2 23 11/18/2022 1429   CO2 23 06/09/2017 1117   BUN 20 11/18/2022 1429   BUN 11 01/27/2020 1148   BUN 11.5 06/09/2017 1117   CREATININE 1.32 (H) 11/18/2022 1429   CREATININE 1.2 (H) 06/09/2017 1117      Component Value Date/Time   CALCIUM 9.5 11/18/2022 1429   CALCIUM 8.9 06/09/2017 1117   ALKPHOS 52 11/18/2022 1429   ALKPHOS 40 06/09/2017 1117   AST 42 (H) 11/18/2022 1429   AST 35 (H) 06/09/2017 1117   ALT 20 11/18/2022 1429   ALT 19 06/09/2017 1117   BILITOT 0.4 11/18/2022 1429   BILITOT 0.37 06/09/2017 1117      Impression and Plan: Ms. Goossen is a very charming 72 year old white female.  She has had a history of early stage breast cancer.  So far, we have not found any problems with respect to recurrent disease.  Her biggest issue is the scleroderma.  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  She had her medications readjusted.  She is clearly anemic.  Her hemoglobin is quite low.  Her corrected reticulocyte count is also low.  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  She also may have some iron deficiency.  I do not think we have to do a bone marrow test on her.  I just do not think this is really going to be all that fruitful for [place].  I think we just have to await the results of our labs and see where we stand with her erythropoietin level and her iron.   Again I suspect we probably will have to give her ESA.  There is also question of whether or not she needs IVIG.  If so, we can certainly do this in our office.  I told her that we do this all the time.  I think is going to hear from [facility] regarding this.  Once I get results back from her labs, will figure out how we can try to address the anemia.    Josph Macho, MD 5/6/20244:15 PM

## 2022-11-18 NOTE — Addendum Note (Signed)
Encounter addended by: Noralee Space, RN on: 11/18/2022 2:54 PM  Actions taken: Order list changed, Diagnosis association updated

## 2022-11-18 NOTE — Progress Notes (Signed)
  Echocardiogram 2D Echocardiogram has been performed.  [name] 11/18/2022, 11:00 AM

## 2022-11-18 NOTE — Patient Instructions (Signed)
STOP Lisinopril  START Losartan 25 mg Daily at bedtime  Your physician recommends that you schedule a follow-up appointment in: 4 months with an echocardiogram  If you have any questions or concerns before your next appointment please send [place] a message through [place] or call our office at [phone].    TO LEAVE A MESSAGE FOR THE NURSE SELECT OPTION 2, PLEASE LEAVE A MESSAGE INCLUDING: YOUR NAME DATE OF BIRTH CALL BACK NUMBER REASON FOR CALL**this is important as we prioritize the call backs  YOU WILL RECEIVE A CALL BACK THE SAME DAY AS LONG AS YOU CALL BEFORE 4:00 PM  At the [facility], you and your health needs are our priority. As part of our continuing mission to provide you with exceptional heart care, we have created designated Provider Care Teams. These Care Teams include your primary Cardiologist (physician) and Advanced Practice Providers (APPs- Physician Assistants and Nurse Practitioners) who all work together to provide you with the care you need, when you need it.   You may see any of the following providers on your designated Care Team at your next follow up: Dr Arvilla Meres Dr Marca Ancona Dr. Marcos Eke, NP Robbie Lis, [place] [facility] [place], [place] Brynda Peon, NP Karle Plumber, PharmD   Please be sure to bring in all your medications bottles to every appointment.    Thank you for choosing [facility]

## 2022-11-19 ENCOUNTER — Telehealth: Payer: Self-pay

## 2022-11-19 ENCOUNTER — Encounter: Payer: Self-pay | Admitting: Family Medicine

## 2022-11-19 ENCOUNTER — Ambulatory Visit (INDEPENDENT_AMBULATORY_CARE_PROVIDER_SITE_OTHER): Payer: Medicare Other | Admitting: Internal Medicine

## 2022-11-19 VITALS — BP 94/58 | HR 88

## 2022-11-19 DIAGNOSIS — I959 Hypotension, unspecified: Secondary | ICD-10-CM

## 2022-11-19 LAB — IRON AND IRON BINDING CAPACITY (CC-WL,HP ONLY)
Iron: 68 ug/dL (ref 28–170)
Saturation Ratios: 27 % (ref 10.4–31.8)
TIBC: 256 ug/dL (ref 250–450)
UIBC: 188 ug/dL

## 2022-11-19 NOTE — Telephone Encounter (Signed)
-----   Message from Josph Macho, MD sent at 11/19/2022 12:48 PM EDT ----- Please call and let her know that the iron level is actually okay.  Thanks.  Cindee Lame

## 2022-11-19 NOTE — Telephone Encounter (Signed)
Advised via MyChart.

## 2022-11-19 NOTE — Progress Notes (Signed)
Patient came to the office to have her BP checked. States that her BP at home her BP was 92/50. Pt has had low hemoglobin which is now at 8.8 compared to 10.3 on 10/28/22. Pt said that cardiology have adjusted meds as they did take her off of lisinopril and have now put her on Losartan 25mg  at bedtime. Last night 5/6 was the first time that she took the medication.  Pt had labwork with her that was done at Dr. Gustavo Lah office 5/6 and a copy of this was made for Dr. Marchelle Gearing to be able to review. Pt made aware that she needs to make sure she communicates with [facility] about low hemoglobin as well as low BPs.

## 2022-11-20 ENCOUNTER — Telehealth: Payer: Self-pay | Admitting: *Deleted

## 2022-11-20 ENCOUNTER — Encounter: Payer: Self-pay | Admitting: Family Medicine

## 2022-11-20 LAB — ERYTHROPOIETIN: Erythropoietin: 22.9 m[IU]/mL — ABNORMAL HIGH (ref 2.6–18.5)

## 2022-11-20 NOTE — Telephone Encounter (Signed)
Error in charting.

## 2022-11-20 NOTE — Telephone Encounter (Signed)
Call from Lyric a Pharmacist from  [facility] regarding IVIG infusion for pt. MD saw pt on 5/6 in the office, note states our office may be able to give IVIG at our office. Will s/w MD regarding orders and additional information.

## 2022-11-21 ENCOUNTER — Other Ambulatory Visit: Payer: Self-pay | Admitting: Hematology & Oncology

## 2022-11-21 ENCOUNTER — Inpatient Hospital Stay: Payer: Medicare Other

## 2022-11-21 ENCOUNTER — Encounter: Payer: Self-pay | Admitting: Family Medicine

## 2022-11-21 VITALS — BP 112/52 | HR 87 | Resp 17

## 2022-11-21 DIAGNOSIS — D631 Anemia in chronic kidney disease: Secondary | ICD-10-CM | POA: Diagnosis not present

## 2022-11-21 DIAGNOSIS — M349 Systemic sclerosis, unspecified: Secondary | ICD-10-CM | POA: Diagnosis not present

## 2022-11-21 DIAGNOSIS — Z853 Personal history of malignant neoplasm of breast: Secondary | ICD-10-CM | POA: Diagnosis not present

## 2022-11-21 DIAGNOSIS — Z79899 Other long term (current) drug therapy: Secondary | ICD-10-CM | POA: Diagnosis not present

## 2022-11-21 DIAGNOSIS — I132 Hypertensive heart and chronic kidney disease with heart failure and with stage 5 chronic kidney disease, or end stage renal disease: Secondary | ICD-10-CM | POA: Diagnosis not present

## 2022-11-21 DIAGNOSIS — Z79624 Long term (current) use of inhibitors of nucleotide synthesis: Secondary | ICD-10-CM | POA: Diagnosis not present

## 2022-11-21 DIAGNOSIS — N1832 Chronic kidney disease, stage 3b: Secondary | ICD-10-CM | POA: Diagnosis not present

## 2022-11-21 DIAGNOSIS — D509 Iron deficiency anemia, unspecified: Secondary | ICD-10-CM

## 2022-11-21 DIAGNOSIS — N1831 Chronic kidney disease, stage 3a: Secondary | ICD-10-CM

## 2022-11-21 MED ORDER — DARBEPOETIN ALFA 300 MCG/0.6ML IJ SOSY
300.0000 ug | PREFILLED_SYRINGE | Freq: Once | INTRAMUSCULAR | Status: AC
  Administered 2022-11-21: 300 ug via SUBCUTANEOUS
  Filled 2022-11-21: qty 0.6

## 2022-11-21 NOTE — Patient Instructions (Signed)

## 2022-11-21 NOTE — Telephone Encounter (Signed)
Lab has been printed and placed in folder.

## 2022-11-25 ENCOUNTER — Encounter: Payer: Self-pay | Admitting: Family Medicine

## 2022-11-25 ENCOUNTER — Telehealth: Payer: Self-pay | Admitting: Internal Medicine

## 2022-11-25 MED ORDER — SYNTHROID 125 MCG PO TABS: 125.0000 ug | ORAL_TABLET | Freq: Every day | ORAL | 1 refills | Status: DC

## 2022-11-25 NOTE — Telephone Encounter (Signed)
  Got email from patient. She is wondering bout her PFT to be done with FVC, MIP and NIF - this is needed ASAP. Please see my prior message    Xxxxxxxxxxxxxxx   Dr. Marchelle Gearing,     I have a health update for you. Last Thursday I received a Aranesp injection to boost my anemia/hemoglobin  numbers.  My next injection will be on May 30. It has been arranged to start the IVIG infusion on June 10.  I will receive the infusion locally. I will have a port installed on Monday, May 20.  Dr.  Gala Romney took me off the lisinopril and started me on Losartan 25 mg once a day at night.     I have not heard from your office regarding setting up the breathing test at the hospital.  I am hopeful I can get this done before I leave for [facility] on May 25. Let me know if I need to be doing something.     [name]  Birth Date: May 25, 1951

## 2022-11-26 NOTE — Telephone Encounter (Signed)
Pt's PFT is one that is to be done at the hospital. Routing to [facility]. If new order needs to be placed, we can definitely get correct order placed.

## 2022-11-26 NOTE — Telephone Encounter (Signed)
Ok Pft scheduled at the hospital on 05/17 should we cancel the one here can someone look at this

## 2022-11-27 DIAGNOSIS — M79641 Pain in right hand: Secondary | ICD-10-CM | POA: Diagnosis not present

## 2022-11-27 DIAGNOSIS — R29898 Other symptoms and signs involving the musculoskeletal system: Secondary | ICD-10-CM | POA: Diagnosis not present

## 2022-11-27 DIAGNOSIS — M79644 Pain in right finger(s): Secondary | ICD-10-CM | POA: Diagnosis not present

## 2022-11-27 DIAGNOSIS — M25642 Stiffness of left hand, not elsewhere classified: Secondary | ICD-10-CM | POA: Diagnosis not present

## 2022-11-27 DIAGNOSIS — M349 Systemic sclerosis, unspecified: Secondary | ICD-10-CM | POA: Diagnosis not present

## 2022-11-27 DIAGNOSIS — M79645 Pain in left finger(s): Secondary | ICD-10-CM | POA: Diagnosis not present

## 2022-11-27 DIAGNOSIS — M3489 Other systemic sclerosis: Secondary | ICD-10-CM | POA: Diagnosis not present

## 2022-11-27 DIAGNOSIS — R600 Localized edema: Secondary | ICD-10-CM | POA: Diagnosis not present

## 2022-11-27 DIAGNOSIS — M79642 Pain in left hand: Secondary | ICD-10-CM | POA: Diagnosis not present

## 2022-11-27 DIAGNOSIS — M25641 Stiffness of right hand, not elsewhere classified: Secondary | ICD-10-CM | POA: Diagnosis not present

## 2022-11-27 DIAGNOSIS — R208 Other disturbances of skin sensation: Secondary | ICD-10-CM | POA: Diagnosis not present

## 2022-11-27 NOTE — Telephone Encounter (Signed)
Since patient has been scheduled for pft at the hospital. PFT for here has been canceled

## 2022-11-29 ENCOUNTER — Other Ambulatory Visit: Payer: Self-pay | Admitting: Radiology

## 2022-11-29 ENCOUNTER — Ambulatory Visit (HOSPITAL_COMMUNITY)
Admission: RE | Admit: 2022-11-29 | Discharge: 2022-11-29 | Disposition: A | Discharge status: Home / Self Care | Payer: Medicare Other | Source: Ambulatory Visit | Attending: Internal Medicine | Admitting: Internal Medicine

## 2022-11-29 DIAGNOSIS — J849 Interstitial pulmonary disease, unspecified: Secondary | ICD-10-CM

## 2022-11-29 LAB — PULMONARY FUNCTION TEST
DL/VA % pred: 66 %
DL/VA: 2.74 ml/min/mmHg/L
DLCO unc % pred: 39 %
DLCO unc: 7.58 ml/min/mmHg
FEF 25-75 Pre: 1.52 L/sec
FEF2575-%Pred-Pre: 86 %
FEV1-%Pred-Pre: 73 %
FEV1-Pre: 1.59 L
FEV1FVC-%Pred-Pre: 106 %
FEV6-%Pred-Pre: 72 %
FEV6-Pre: 1.99 L
FEV6FVC-%Pred-Pre: 104 %
FVC-%Pred-Pre: 68 %
FVC-Pre: 1.99 L
Pre FEV1/FVC ratio: 80 %
Pre FEV6/FVC Ratio: 100 %
RV % pred: 63 %
RV: 1.43 L
TLC % pred: 64 %
TLC: 3.28 L

## 2022-12-01 ENCOUNTER — Encounter: Payer: Self-pay | Admitting: Hematology & Oncology

## 2022-12-02 ENCOUNTER — Other Ambulatory Visit: Payer: Self-pay

## 2022-12-02 ENCOUNTER — Ambulatory Visit (HOSPITAL_COMMUNITY)
Admission: RE | Admit: 2022-12-02 | Discharge: 2022-12-02 | Disposition: A | Discharge status: Home / Self Care | Payer: Medicare Other | Source: Ambulatory Visit | Attending: Hematology & Oncology | Admitting: Hematology & Oncology

## 2022-12-02 ENCOUNTER — Encounter (HOSPITAL_COMMUNITY): Payer: Self-pay

## 2022-12-02 DIAGNOSIS — I129 Hypertensive chronic kidney disease with stage 1 through stage 4 chronic kidney disease, or unspecified chronic kidney disease: Secondary | ICD-10-CM | POA: Diagnosis not present

## 2022-12-02 DIAGNOSIS — Z452 Encounter for adjustment and management of vascular access device: Secondary | ICD-10-CM | POA: Diagnosis not present

## 2022-12-02 DIAGNOSIS — N189 Chronic kidney disease, unspecified: Secondary | ICD-10-CM | POA: Diagnosis not present

## 2022-12-02 DIAGNOSIS — M349 Systemic sclerosis, unspecified: Secondary | ICD-10-CM | POA: Diagnosis not present

## 2022-12-02 DIAGNOSIS — D649 Anemia, unspecified: Secondary | ICD-10-CM | POA: Diagnosis not present

## 2022-12-02 DIAGNOSIS — E785 Hyperlipidemia, unspecified: Secondary | ICD-10-CM | POA: Diagnosis not present

## 2022-12-02 DIAGNOSIS — Z853 Personal history of malignant neoplasm of breast: Secondary | ICD-10-CM | POA: Insufficient documentation

## 2022-12-02 HISTORY — PX: IR IMAGING GUIDED PORT INSERTION: IMG5740

## 2022-12-02 MED ORDER — MIDAZOLAM HCL 2 MG/2ML IJ SOLN
INTRAMUSCULAR | Status: AC
  Filled 2022-12-02: qty 2

## 2022-12-02 MED ORDER — FENTANYL CITRATE (PF) 100 MCG/2ML IJ SOLN
INTRAMUSCULAR | Status: AC
  Filled 2022-12-02: qty 2

## 2022-12-02 MED ORDER — MIDAZOLAM HCL 2 MG/2ML IJ SOLN
INTRAMUSCULAR | Status: AC | PRN
  Administered 2022-12-02 (×2): 1 mg via INTRAVENOUS

## 2022-12-02 MED ORDER — FLUMAZENIL 0.5 MG/5ML IV SOLN
INTRAVENOUS | Status: AC
  Filled 2022-12-02: qty 5

## 2022-12-02 MED ORDER — NALOXONE HCL 0.4 MG/ML IJ SOLN
INTRAMUSCULAR | Status: AC
  Filled 2022-12-02: qty 1

## 2022-12-02 MED ORDER — CHLOROPROCAINE HCL 1 % IJ SOLN
INTRAMUSCULAR | Status: AC
  Filled 2022-12-02: qty 30

## 2022-12-02 MED ORDER — HEPARIN SOD (PORK) LOCK FLUSH 100 UNIT/ML IV SOLN
INTRAVENOUS | Status: AC | PRN
  Administered 2022-12-02: 500 [IU]

## 2022-12-02 MED ORDER — FENTANYL CITRATE (PF) 100 MCG/2ML IJ SOLN
INTRAMUSCULAR | Status: AC | PRN
  Administered 2022-12-02 (×2): 50 ug via INTRAVENOUS

## 2022-12-02 MED ORDER — DIPHENHYDRAMINE HCL 50 MG/ML IJ SOLN
INTRAMUSCULAR | Status: AC
  Filled 2022-12-02: qty 1

## 2022-12-02 MED ORDER — SODIUM CHLORIDE 0.9 % IV SOLN: INTRAVENOUS | Status: DC

## 2022-12-02 MED ORDER — DIPHENHYDRAMINE HCL 50 MG/ML IJ SOLN
INTRAMUSCULAR | Status: AC | PRN
  Administered 2022-12-02: 25 mg via INTRAVENOUS

## 2022-12-02 MED ORDER — CHLOROPROCAINE HCL 1 % IJ SOLN
30.0000 mL | Freq: Once | INTRAMUSCULAR | Status: AC
  Administered 2022-12-02: 20 mL

## 2022-12-02 MED ORDER — HEPARIN SOD (PORK) LOCK FLUSH 100 UNIT/ML IV SOLN
INTRAVENOUS | Status: AC
  Filled 2022-12-02: qty 5

## 2022-12-02 NOTE — Procedures (Signed)
Vascular and Interventional Radiology Procedure Note  Patient: [name] DOB: 25-Sep-1950 Medical Record Number: [id number] Note Date/Time: 12/02/22 2:32 PM   Performing Physician: Roanna Banning, MD Assistant(s): None  Diagnosis:  Scleroderma  Procedure: PORT PLACEMENT  Anesthesia: Conscious Sedation Complications: None Estimated Blood Loss: Minimal  Findings:  Successful right-sided port placement, with the tip of the catheter in the proximal right atrium.  Plan: Catheter ready for use.  See detailed procedure note with images in PACS. The patient tolerated the procedure well without incident or complication and was returned to Recovery in stable condition.    Roanna Banning, MD [facility] Radiology   Pager. [phone] Clinic. [phone]

## 2022-12-02 NOTE — Discharge Instructions (Signed)

## 2022-12-02 NOTE — H&P (Signed)
Chief Complaint: Patient was seen in consultation today for scleroderma  Referring Physician(s): Ennever,Peter R  Supervising Physician: Roanna Banning  Patient Status: [facility] - Out-pt  History of Present Illness: [name] is a 72 y.o. 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 Patient is under the care of Dr Myna Hidalgo for her scleroderma, who has referred patient to IR for image-guided port placement to facilitate treatment for her scleroderma.  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 Gaynell Face at [facility]   systemic sclerosis , diffuse/   rheumotologist:  dr Sherryll Burger at [facility]---  lov 07-22-2017 (as of 07-25-2017 note not available to read)---  per pt has appt. w/ specialist at [facility]   Skin thickening    hands, arms, legs  caused by scleroderma    Systemic sclerosis (HCC)    UTI (urinary tract infection)     Past Surgical History:  Procedure Laterality Date   BIOPSY  01/29/2018   Procedure: BIOPSY;  Surgeon: Beverley Fiedler, MD;  Location: [facility];  Service: Gastroenterology;;   Arbutus Leas  2000   Left   COLONOSCOPY WITH PROPOFOL N/A 01/29/2018   Procedure: COLONOSCOPY WITH PROPOFOL;  Surgeon: Beverley Fiedler, MD;  Location: [facility];  Service: Gastroenterology;  Laterality: N/A;   DILATATION & CURETTAGE/HYSTEROSCOPY WITH MYOSURE N/A 08/04/2017   Procedure: DILATATION & CURETTAGE/HYSTEROSCOPY WITH MYOSURE;  Surgeon: Jerene Bears, MD;  Location: [facility];  Service: Gynecology;  Laterality: N/A;   ECTOPIC PREGNANCY SURGERY  1986-88   s/p unilateral salpingectomy   ESOPHAGOGASTRODUODENOSCOPY (EGD) WITH PROPOFOL N/A 01/29/2018   Procedure: ESOPHAGOGASTRODUODENOSCOPY (EGD) WITH PROPOFOL;  Surgeon: Beverley Fiedler, MD;  Location: [facility];  Service: Gastroenterology;  Laterality: N/A;   PARTIAL MASTECTOMY WITH AXILLARY SENTINEL LYMPH NODE BIOPSY Left 11-04-2011   dr Jamey Ripa  [facility]   RIGHT HEART CATH N/A 10/01/2022   Procedure: RIGHT HEART CATH;  Surgeon: Dolores Patty, MD;  Location: [facility] INVASIVE CV LAB;  Service: Cardiovascular;  Laterality: N/A;   TRANSTHORACIC ECHOCARDIOGRAM  06-04-2017    [facility]   moderate LVH, ef>55%/  trivial AR and TR/ mild MR and PR/  trivial pericardial effusion   WISDOM TOOTH EXTRACTION      Allergies: Betadine [  povidone iodine], Contrast media [iodinated contrast media], Epinephrine, Iodine, Oxycodone, Prednisone, Gabapentin, Lidocaine, Nickel, Other, Penicillins, and Sulfa antibiotics  Medications: Prior to Admission medications   Medication Sig Start Date End Date Taking? Authorizing Provider  acetaminophen (TYLENOL) 650 MG CR tablet Take 650 mg by mouth daily as needed for pain.   Yes [provider]  diphenhydrAMINE (BENADRYL) 12.5 MG chewable tablet Chew 12.5 mg by  mouth 4 (four) times daily as needed for allergies.   Yes [provider]  ELDERBERRY PO Take 1 oz by mouth 2 (two) times daily. Liquid   Yes [provider]  furosemide (LASIX) 20 MG tablet Take 20 mg by mouth daily. 12/01/16  Yes [provider]  losartan (COZAAR) 25 MG tablet Take 1 tablet (25 mg total) by mouth at bedtime. 11/18/22  Yes Bensimhon, Bevelyn Buckles, MD  Multiple Vitamin (MULTIVITAMIN) capsule Take 1 capsule by mouth daily. 09/03/15  Yes [provider]  mycophenolate (CELLCEPT) 500 MG tablet Take 1,500 mg by mouth 2 (two) times daily. 08/15/22  Yes [provider]  pantoprazole (PROTONIX) 40 MG tablet Take 40 mg by mouth 2 (two) times daily.   Yes [provider]  sodium chloride (OCEAN) 0.65 % SOLN nasal spray Place 1 spray into both nostrils as needed for congestion.   Yes [provider]  SYNTHROID 125 MCG tablet Take 1 tablet (125 mcg total) by mouth daily before breakfast. *Must be the brand only* 11/25/22  Yes Copland, Gwenlyn Found, MD     Family History  Problem Relation Age of Onset   Heart disease Mother    Heart failure Mother    Heart disease Father    Ovarian cancer Maternal Aunt    Heart disease Paternal Uncle        multiple   Autoimmune disease Sister    Arrhythmia Sister    Anemia Sister    CAD Brother    Colon cancer Neg Hx    Stomach cancer Neg Hx    Esophageal cancer Neg Hx    Rectal cancer Neg Hx     Social History   Socioeconomic History   Marital status: Married    Spouse name: Not on file   Number of children: 1   Years of education: 16   Highest education level: Not on file  Occupational History   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  Has 1 daughter.     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     Education: college.    Social Determinants of Health   Financial Resource Strain: Low Risk  (11/14/2022)   Overall Financial Resource Strain (CARDIA)    Difficulty of Paying Living Expenses: Not hard at all  Food Insecurity: No Food Insecurity (08/09/2022)   Hunger Vital Sign    Worried About Running Out of Food in the Last Year: Never true    Ran Out of Food in the Last Year: Never true  Transportation Needs: No Transportation Needs (08/09/2022)   PRAPARE - Administrator, Civil Service (Medical): No  Lack of Transportation (Non-Medical): No  Physical Activity: Inactive (11/14/2022)   Exercise Vital Sign    Days of Exercise per Week: 0 days    Minutes of Exercise per Session: 0 min  Stress: No Stress Concern Present (11/14/2022)   [employer] of Occupational Health - Occupational Stress Questionnaire    Feeling of Stress : Not at all  Social Connections: Socially Integrated (11/14/2022)   Social Connection and Isolation Panel [NHANES]    Frequency of Communication with Friends and Family: More than three times a week    Frequency of Social Gatherings with Friends and Family: More than three times a week    Attends Religious Services: 1 to 4 times per year    Active Member of [employer] or Organizations: Yes    Attends Engineer, structural: More than 4 times per year    Marital Status: Married    Code Status: Full code  Review of Systems: A 12 point ROS discussed and pertinent positives are indicated in the HPI above.  All other systems are negative.  Review of Systems  Constitutional:  Negative for chills and fever.  Respiratory:  Negative for chest tightness and shortness of breath.   Cardiovascular:  Negative for chest pain and leg swelling.  Gastrointestinal:  Negative for abdominal pain, diarrhea,  nausea and vomiting.  Neurological:  Positive for light-headedness. Negative for dizziness and headaches.       Patient gets light-headed when standing with new hypertension medication    Vital Signs: BP 129/62 (BP Location: Right Arm)   Pulse 94   Temp 97.8 F (36.6 C) (Oral)   Resp 15   Ht 5\' 4"  (1.626 m)   Wt 132 lb (59.9 kg)   SpO2 99%   BMI 22.66 kg/m   Advance Care Plan: The advanced care plan/surrogate decision maker was discussed at the time of visit and documented in the medical record.    Physical Exam Vitals reviewed.  HENT:     Mouth/Throat:     Mouth: Mucous membranes are moist.  Cardiovascular:     Rate and Rhythm: Normal rate and regular rhythm.     Pulses: Normal pulses.     Heart sounds: Normal heart sounds.  Pulmonary:     Effort: Pulmonary effort is normal.     Breath sounds: Normal breath sounds.  Abdominal:     Palpations: Abdomen is soft.     Tenderness: There is no abdominal tenderness.  Musculoskeletal:     Right lower leg: No edema.     Left lower leg: No edema.  Skin:    General: Skin is warm and dry.  Neurological:     Mental Status: She is alert and oriented to person, place, and time.  Psychiatric:        Mood and Affect: Mood normal.        Behavior: Behavior normal.     Imaging: ECHOCARDIOGRAM COMPLETE  Result Date: 11/18/2022    ECHOCARDIOGRAM REPORT   Patient Name:   [name] Date of Exam: 11/18/2022 Medical Rec #:  [id number]          Height:       64.5 in Accession #:    [id number]         Weight:       133.0 lb Date of Birth:  04-Nov-1950          BSA:          1.654 m Patient Age:  71 years           BP:           100/60 mmHg Patient Gender: F                  HR:           85 bpm. Exam Location:  Outpatient Procedure: 2D Echo, Cardiac Doppler, Color Doppler and Strain Analysis Indications:    I27.20 Pulmonary Hypertension  History:        Patient has prior history of Echocardiogram examinations, most                  recent 08/05/2022. Signs/Symptoms:Dyspnea; Risk                 Factors:Hypertension and Dyslipidemia. Pericardial effusion.                 Breast cancer. Scleroderma.  Sonographer:    Sheralyn Boatman RDCS Referring Phys: [id number] DANIEL R BENSIMHON  Sonographer Comments: Technically difficult study due to poor echo windows and suboptimal apical window. Extremely difficult study due to scleroderma. Apical region tissue was very rigid. IMPRESSIONS  1. Left ventricular ejection fraction, by estimation, is 60 to 65%. The left ventricle has normal function. The left ventricle has no regional wall motion abnormalities. There is mild asymmetric left ventricular hypertrophy of the basal-septal segment. Left ventricular diastolic parameters are consistent with Grade I diastolic dysfunction (impaired relaxation).  2. Right ventricular systolic function is normal. The right ventricular size is normal. There is mildly elevated pulmonary artery systolic pressure. The estimated right ventricular systolic pressure is 41.2 mmHg.  3. Moderate pericardial effusion. The pericardial effusion is circumferential. There is no evidence of cardiac tamponade.  4. The mitral valve is normal in structure. Mild mitral valve regurgitation.  5. Tricuspid valve regurgitation is mild to moderate.  6. The aortic valve is tricuspid. Aortic valve regurgitation is mild. Aortic valve sclerosis/calcification is present, without any evidence of aortic stenosis.  7. The inferior vena cava is normal in size with greater than 50% respiratory variability, suggesting right atrial pressure of 3 mmHg. Comparison(s): No significant change from prior TTE. Continues to have a moderate pericardial effusion with no evidence of tamponade. FINDINGS  Left Ventricle: Left ventricular ejection fraction, by estimation, is 60 to 65%. The left ventricle has normal function. The left ventricle has no regional wall motion abnormalities. The global longitudinal strain is normal despite  suboptimal segment tracking. The left ventricular internal cavity size was normal in size. There is mild asymmetric left ventricular hypertrophy of the basal-septal segment. Left ventricular diastolic parameters are consistent with Grade I diastolic dysfunction (impaired relaxation). Right Ventricle: The right ventricular size is normal. Right vetricular wall thickness was not well visualized. Right ventricular systolic function is normal. There is mildly elevated pulmonary artery systolic pressure. The tricuspid regurgitant velocity  is 3.09 m/s, and with an assumed right atrial pressure of 3 mmHg, the estimated right ventricular systolic pressure is 41.2 mmHg. Left Atrium: Left atrial size was normal in size. Right Atrium: Right atrial size was normal in size. Pericardium: A moderately sized pericardial effusion is present. The pericardial effusion is circumferential. There is no evidence of cardiac tamponade. Mitral Valve: The mitral valve is normal in structure. Mild mitral valve regurgitation. Tricuspid Valve: The tricuspid valve is normal in structure. Tricuspid valve regurgitation is mild to moderate. Aortic Valve: The aortic valve is tricuspid. Aortic valve regurgitation is mild. Aortic valve sclerosis/calcification is present,  without any evidence of aortic stenosis. Pulmonic Valve: The pulmonic valve was normal in structure. Pulmonic valve regurgitation is mild. Aorta: The aortic root and ascending aorta are structurally normal, with no evidence of dilitation. Venous: The inferior vena cava is normal in size with greater than 50% respiratory variability, suggesting right atrial pressure of 3 mmHg. IAS/Shunts: The atrial septum is grossly normal.  LEFT VENTRICLE PLAX 2D LVIDd:         3.50 cm     Diastology LVIDs:         2.40 cm     LV e' medial:    7.51 cm/s LV PW:         1.20 cm     LV E/e' medial:  8.1 LV IVS:        1.20 cm     LV e' lateral:   8.92 cm/s LVOT diam:     2.10 cm     LV E/e' lateral: 6.9  LV SV:         47 LV SV Index:   28 LVOT Area:     3.46 cm  LV Volumes (MOD) LV vol d, MOD A2C: 53.5 ml LV vol d, MOD A4C: 85.0 ml LV vol s, MOD A2C: 20.9 ml LV vol s, MOD A4C: 35.0 ml LV SV MOD A2C:     32.6 ml LV SV MOD A4C:     85.0 ml LV SV MOD BP:      42.6 ml RIGHT VENTRICLE             IVC RV S prime:     10.20 cm/s  IVC diam: 1.40 cm TAPSE (M-mode): 1.8 cm LEFT ATRIUM             Index        RIGHT ATRIUM          Index LA diam:        3.00 cm 1.81 cm/m   RA Area:     6.00 cm LA Vol (A2C):   15.2 ml 9.19 ml/m   RA Volume:   9.42 ml  5.70 ml/m LA Vol (A4C):   30.1 ml 18.20 ml/m LA Biplane Vol: 22.2 ml 13.42 ml/m  AORTIC VALVE             PULMONIC VALVE LVOT Vmax:   81.30 cm/s  PR End Diast Vel: 2.15 msec LVOT Vmean:  53.000 cm/s LVOT VTI:    0.135 m  AORTA Ao Root diam: 3.00 cm Ao Asc diam:  3.30 cm MITRAL VALVE               TRICUSPID VALVE MV Area (PHT): 3.85 cm    TR Peak grad:   38.2 mmHg MV Decel Time: 197 msec    TR Vmax:        309.00 cm/s MV E velocity: 61.20 cm/s MV A velocity: 80.50 cm/s  SHUNTS MV E/A ratio:  0.76        Systemic VTI:  0.14 m                            Systemic Diam: 2.10 cm Laurance Flatten MD Electronically signed by Laurance Flatten MD Signature Date/Time: 11/18/2022/2:37:46 PM    Final    DG Sniff Test  Result Date: 11/04/2022 CLINICAL DATA:  Scleroderma. Dyspnea. Recent hospitalization for renal failure. EXAM: CHEST FLUOROSCOPY TECHNIQUE: Real-time fluoroscopic evaluation of the chest was performed. FLUOROSCOPY: Radiation  Exposure Index (as provided by the fluoroscopic device): 2.4 mGy Kerma COMPARISON:  08/10/2022 chest CT. FINDINGS: Minimal asymmetric elevation of the left hemidiaphragm at rest, unchanged from 08/10/2022 chest CT. There is normal symmetric muscular contraction of the bilateral hemidiaphragms with deep breathing, sniff and cough maneuvers. IMPRESSION: Normal symmetric muscular activity of the bilateral hemidiaphragms. No evidence of diaphragmatic  paresis or plegia. Minimal chronic asymmetric elevation of the left hemidiaphragm at rest, unchanged from prior imaging, compatible with minimal eventration. Electronically Signed   By: Delbert Phenix M.D.   On: 11/04/2022 10:43    Labs:  CBC: Recent Labs    09/25/22 0951 10/01/22 0746 10/01/22 0750 10/09/22 1054 10/28/22 0958 11/18/22 1429  WBC 8.2  --   --  8.0 7.9 9.0  HGB 12.0   < > 10.2* 11.8* 10.3* 8.8*  HCT 36.6   < > 30.0* 35.6* 30.0* 26.9*  PLT 280.0  --   --  345.0 320.0 300   < > = values in this interval not displayed.    COAGS: No results for input(s): "INR", "APTT" in the last 8760 hours.  BMP: Recent Labs    05/22/22 1147 05/22/22 1147 08/09/22 1150 08/10/22 0549 08/22/22 0951 09/25/22 0951 10/01/22 0746 10/01/22 0750 10/09/22 1054 10/28/22 0958 11/18/22 1429  NA 139  --  131* 134*   < > 136   < > 135 134* 134* 135  K 3.5  --  3.0* 3.4*   < > 4.2   < > 4.0 4.5 4.1 3.9  CL 100  --  98 101   < > 103  --   --  100 100 103  CO2 30  --  24 20*   < > 23  --   --  23 22 23   GLUCOSE 116*  --  113* 133*   < > 105*  --   --  78 100* 106*  BUN 10  --  17 16   < > 16  --   --  15 20 20   CALCIUM 9.7  --  8.7* 9.0   < > 9.4  --   --  9.7 9.6 9.5  CREATININE 1.11*   < > 1.80* 1.74*   < > 1.32*  --   --  1.39* 1.38* 1.32*  GFRNONAA 52*  --  29* 31*  --   --   --   --   --   --  43*   < > = values in this interval not displayed.    LIVER FUNCTION TESTS: Recent Labs    09/25/22 0951 10/09/22 1054 10/28/22 0958 11/18/22 1429  BILITOT 0.4 0.4 0.4 0.4  AST 42* 49* 49* 42*  ALT 21 24 26 20   ALKPHOS 61 66 60 52  PROT 6.4 7.1 6.8 6.7  ALBUMIN 3.8 4.3 4.0 4.0    TUMOR MARKERS: No results for input(s): "AFPTM", "CEA", "CA199", "CHROMGRNA" in the last 8760 hours.  Assessment and Plan:  [name] is a 72 yo female with scleroderma being seen today in relation to image-guided port placement. The patient presents today in her usual state of health and is  NPO. Case has been reviewed with Dr Milford Cage and is scheduled to proceed on 12/02/22. Of note, patient is allergic to lidocaine; chloroprocaine will be used for local anesthetic needs as recommended by pharmacy team.  Risks and benefits of image guided port-a-catheter placement was discussed with the patient including, but not limited to bleeding, infection, pneumothorax,  or fibrin sheath development and need for additional procedures.  All of the patient's questions were answered, patient is agreeable to proceed. Consent signed and in chart.   Thank you for this interesting consult.  I greatly enjoyed meeting [name] and look forward to participating in their care.  A copy of this report was sent to the requesting provider on this date.  Electronically Signed: Kennieth Francois, PA-C 12/02/2022, 1:26 PM   I spent a total of  30 Minutes   in face to face in clinical consultation, greater than 50% of which was counseling/coordinating care for scleroderma.

## 2022-12-03 DIAGNOSIS — M6281 Muscle weakness (generalized): Secondary | ICD-10-CM | POA: Diagnosis not present

## 2022-12-03 DIAGNOSIS — R2681 Unsteadiness on feet: Secondary | ICD-10-CM | POA: Diagnosis not present

## 2022-12-03 DIAGNOSIS — M62838 Other muscle spasm: Secondary | ICD-10-CM | POA: Diagnosis not present

## 2022-12-03 DIAGNOSIS — M25551 Pain in right hip: Secondary | ICD-10-CM | POA: Diagnosis not present

## 2022-12-04 DIAGNOSIS — M3489 Other systemic sclerosis: Secondary | ICD-10-CM | POA: Diagnosis not present

## 2022-12-04 DIAGNOSIS — M25642 Stiffness of left hand, not elsewhere classified: Secondary | ICD-10-CM | POA: Diagnosis not present

## 2022-12-04 DIAGNOSIS — M79645 Pain in left finger(s): Secondary | ICD-10-CM | POA: Diagnosis not present

## 2022-12-04 DIAGNOSIS — M79644 Pain in right finger(s): Secondary | ICD-10-CM | POA: Diagnosis not present

## 2022-12-04 DIAGNOSIS — M25641 Stiffness of right hand, not elsewhere classified: Secondary | ICD-10-CM | POA: Diagnosis not present

## 2022-12-04 DIAGNOSIS — M79641 Pain in right hand: Secondary | ICD-10-CM | POA: Diagnosis not present

## 2022-12-04 DIAGNOSIS — R600 Localized edema: Secondary | ICD-10-CM | POA: Diagnosis not present

## 2022-12-04 DIAGNOSIS — M79642 Pain in left hand: Secondary | ICD-10-CM | POA: Diagnosis not present

## 2022-12-04 DIAGNOSIS — M349 Systemic sclerosis, unspecified: Secondary | ICD-10-CM | POA: Diagnosis not present

## 2022-12-10 DIAGNOSIS — E039 Hypothyroidism, unspecified: Secondary | ICD-10-CM | POA: Diagnosis not present

## 2022-12-10 DIAGNOSIS — E559 Vitamin D deficiency, unspecified: Secondary | ICD-10-CM | POA: Diagnosis not present

## 2022-12-10 DIAGNOSIS — D84821 Immunodeficiency due to drugs: Secondary | ICD-10-CM | POA: Diagnosis not present

## 2022-12-10 DIAGNOSIS — M349 Systemic sclerosis, unspecified: Secondary | ICD-10-CM | POA: Diagnosis not present

## 2022-12-10 DIAGNOSIS — J849 Interstitial pulmonary disease, unspecified: Secondary | ICD-10-CM | POA: Diagnosis not present

## 2022-12-10 DIAGNOSIS — Z79899 Other long term (current) drug therapy: Secondary | ICD-10-CM | POA: Diagnosis not present

## 2022-12-11 ENCOUNTER — Other Ambulatory Visit: Payer: Self-pay | Admitting: *Deleted

## 2022-12-11 DIAGNOSIS — M349 Systemic sclerosis, unspecified: Secondary | ICD-10-CM

## 2022-12-11 DIAGNOSIS — D509 Iron deficiency anemia, unspecified: Secondary | ICD-10-CM

## 2022-12-11 DIAGNOSIS — I272 Pulmonary hypertension, unspecified: Secondary | ICD-10-CM | POA: Diagnosis not present

## 2022-12-11 DIAGNOSIS — R9389 Abnormal findings on diagnostic imaging of other specified body structures: Secondary | ICD-10-CM | POA: Diagnosis not present

## 2022-12-11 DIAGNOSIS — J849 Interstitial pulmonary disease, unspecified: Secondary | ICD-10-CM | POA: Diagnosis not present

## 2022-12-12 ENCOUNTER — Other Ambulatory Visit: Payer: Self-pay

## 2022-12-12 ENCOUNTER — Inpatient Hospital Stay: Payer: Medicare Other

## 2022-12-12 VITALS — BP 130/58 | HR 108 | Temp 97.8°F | Resp 16

## 2022-12-12 DIAGNOSIS — I132 Hypertensive heart and chronic kidney disease with heart failure and with stage 5 chronic kidney disease, or end stage renal disease: Secondary | ICD-10-CM | POA: Diagnosis not present

## 2022-12-12 DIAGNOSIS — Z79899 Other long term (current) drug therapy: Secondary | ICD-10-CM | POA: Diagnosis not present

## 2022-12-12 DIAGNOSIS — M349 Systemic sclerosis, unspecified: Secondary | ICD-10-CM | POA: Diagnosis not present

## 2022-12-12 DIAGNOSIS — N1832 Chronic kidney disease, stage 3b: Secondary | ICD-10-CM | POA: Diagnosis not present

## 2022-12-12 DIAGNOSIS — D509 Iron deficiency anemia, unspecified: Secondary | ICD-10-CM

## 2022-12-12 DIAGNOSIS — Z853 Personal history of malignant neoplasm of breast: Secondary | ICD-10-CM | POA: Diagnosis not present

## 2022-12-12 DIAGNOSIS — D631 Anemia in chronic kidney disease: Secondary | ICD-10-CM | POA: Diagnosis not present

## 2022-12-12 MED ORDER — DARBEPOETIN ALFA 300 MCG/0.6ML IJ SOSY
300.0000 ug | PREFILLED_SYRINGE | Freq: Once | INTRAMUSCULAR | Status: AC
  Administered 2022-12-12: 300 ug via SUBCUTANEOUS
  Filled 2022-12-12: qty 0.6

## 2022-12-12 NOTE — Patient Instructions (Signed)

## 2022-12-18 ENCOUNTER — Telehealth (HOSPITAL_COMMUNITY): Payer: Self-pay

## 2022-12-18 DIAGNOSIS — M25641 Stiffness of right hand, not elsewhere classified: Secondary | ICD-10-CM | POA: Diagnosis not present

## 2022-12-18 DIAGNOSIS — M349 Systemic sclerosis, unspecified: Secondary | ICD-10-CM | POA: Diagnosis not present

## 2022-12-18 DIAGNOSIS — M79645 Pain in left finger(s): Secondary | ICD-10-CM | POA: Diagnosis not present

## 2022-12-18 DIAGNOSIS — M79644 Pain in right finger(s): Secondary | ICD-10-CM | POA: Diagnosis not present

## 2022-12-18 DIAGNOSIS — R600 Localized edema: Secondary | ICD-10-CM | POA: Diagnosis not present

## 2022-12-18 DIAGNOSIS — M3489 Other systemic sclerosis: Secondary | ICD-10-CM | POA: Diagnosis not present

## 2022-12-18 DIAGNOSIS — R531 Weakness: Secondary | ICD-10-CM | POA: Diagnosis not present

## 2022-12-18 DIAGNOSIS — M2560 Stiffness of unspecified joint, not elsewhere classified: Secondary | ICD-10-CM | POA: Diagnosis not present

## 2022-12-18 DIAGNOSIS — M79641 Pain in right hand: Secondary | ICD-10-CM | POA: Diagnosis not present

## 2022-12-18 DIAGNOSIS — M79642 Pain in left hand: Secondary | ICD-10-CM | POA: Diagnosis not present

## 2022-12-18 DIAGNOSIS — M25642 Stiffness of left hand, not elsewhere classified: Secondary | ICD-10-CM | POA: Diagnosis not present

## 2022-12-18 NOTE — Telephone Encounter (Signed)
Pt is not able to do pulmonary rehab at this time. Closed referral.

## 2022-12-19 ENCOUNTER — Encounter: Payer: Self-pay | Admitting: Hematology & Oncology

## 2022-12-19 DIAGNOSIS — M6281 Muscle weakness (generalized): Secondary | ICD-10-CM | POA: Diagnosis not present

## 2022-12-19 DIAGNOSIS — R2681 Unsteadiness on feet: Secondary | ICD-10-CM | POA: Diagnosis not present

## 2022-12-19 DIAGNOSIS — M25551 Pain in right hip: Secondary | ICD-10-CM | POA: Diagnosis not present

## 2022-12-19 DIAGNOSIS — M62838 Other muscle spasm: Secondary | ICD-10-CM | POA: Diagnosis not present

## 2022-12-23 ENCOUNTER — Encounter: Payer: Self-pay | Admitting: Hematology & Oncology

## 2022-12-23 ENCOUNTER — Inpatient Hospital Stay (HOSPITAL_BASED_OUTPATIENT_CLINIC_OR_DEPARTMENT_OTHER): Payer: Medicare Other | Admitting: Hematology & Oncology

## 2022-12-23 ENCOUNTER — Inpatient Hospital Stay: Payer: Medicare Other

## 2022-12-23 ENCOUNTER — Inpatient Hospital Stay: Payer: Medicare Other | Attending: Hematology & Oncology

## 2022-12-23 VITALS — BP 143/52 | HR 91 | Temp 97.6°F | Resp 20 | Ht 64.0 in | Wt 138.0 lb

## 2022-12-23 VITALS — BP 135/70 | HR 77

## 2022-12-23 DIAGNOSIS — I132 Hypertensive heart and chronic kidney disease with heart failure and with stage 5 chronic kidney disease, or end stage renal disease: Secondary | ICD-10-CM | POA: Diagnosis not present

## 2022-12-23 DIAGNOSIS — D631 Anemia in chronic kidney disease: Secondary | ICD-10-CM | POA: Insufficient documentation

## 2022-12-23 DIAGNOSIS — Z79624 Long term (current) use of inhibitors of nucleotide synthesis: Secondary | ICD-10-CM | POA: Diagnosis not present

## 2022-12-23 DIAGNOSIS — M349 Systemic sclerosis, unspecified: Secondary | ICD-10-CM | POA: Insufficient documentation

## 2022-12-23 DIAGNOSIS — C50611 Malignant neoplasm of axillary tail of right female breast: Secondary | ICD-10-CM

## 2022-12-23 DIAGNOSIS — Z79890 Hormone replacement therapy (postmenopausal): Secondary | ICD-10-CM | POA: Diagnosis not present

## 2022-12-23 DIAGNOSIS — D509 Iron deficiency anemia, unspecified: Secondary | ICD-10-CM

## 2022-12-23 DIAGNOSIS — N1832 Chronic kidney disease, stage 3b: Secondary | ICD-10-CM | POA: Insufficient documentation

## 2022-12-23 DIAGNOSIS — R799 Abnormal finding of blood chemistry, unspecified: Secondary | ICD-10-CM | POA: Diagnosis not present

## 2022-12-23 DIAGNOSIS — Z853 Personal history of malignant neoplasm of breast: Secondary | ICD-10-CM | POA: Insufficient documentation

## 2022-12-23 DIAGNOSIS — Z79899 Other long term (current) drug therapy: Secondary | ICD-10-CM | POA: Insufficient documentation

## 2022-12-23 DIAGNOSIS — Z170 Estrogen receptor positive status [ER+]: Secondary | ICD-10-CM

## 2022-12-23 LAB — CBC WITH DIFFERENTIAL (CANCER CENTER ONLY)
Abs Immature Granulocytes: 0.04 10*3/uL (ref 0.00–0.07)
Basophils Absolute: 0 10*3/uL (ref 0.0–0.1)
Basophils Relative: 0 %
Eosinophils Absolute: 0.2 10*3/uL (ref 0.0–0.5)
Eosinophils Relative: 2 %
HCT: 29.5 % — ABNORMAL LOW (ref 36.0–46.0)
Hemoglobin: 9 g/dL — ABNORMAL LOW (ref 12.0–15.0)
Immature Granulocytes: 0 %
Lymphocytes Relative: 8 %
Lymphs Abs: 0.7 10*3/uL (ref 0.7–4.0)
MCH: 30.2 pg (ref 26.0–34.0)
MCHC: 30.5 g/dL (ref 30.0–36.0)
MCV: 99 fL (ref 80.0–100.0)
Monocytes Absolute: 0.8 10*3/uL (ref 0.1–1.0)
Monocytes Relative: 8 %
Neutro Abs: 7.1 10*3/uL (ref 1.7–7.7)
Neutrophils Relative %: 82 %
Platelet Count: 290 10*3/uL (ref 150–400)
RBC: 2.98 MIL/uL — ABNORMAL LOW (ref 3.87–5.11)
RDW: 15.4 % (ref 11.5–15.5)
WBC Count: 8.9 10*3/uL (ref 4.0–10.5)
nRBC: 0 % (ref 0.0–0.2)

## 2022-12-23 LAB — CMP (CANCER CENTER ONLY)
ALT: 21 U/L (ref 0–44)
AST: 45 U/L — ABNORMAL HIGH (ref 15–41)
Albumin: 3.9 g/dL (ref 3.5–5.0)
Alkaline Phosphatase: 50 U/L (ref 38–126)
Anion gap: 9 (ref 5–15)
BUN: 11 mg/dL (ref 8–23)
CO2: 27 mmol/L (ref 22–32)
Calcium: 9.5 mg/dL (ref 8.9–10.3)
Chloride: 103 mmol/L (ref 98–111)
Creatinine: 1.14 mg/dL — ABNORMAL HIGH (ref 0.44–1.00)
GFR, Estimated: 51 mL/min — ABNORMAL LOW (ref >60)
Glucose, Bld: 97 mg/dL (ref 70–99)
Potassium: 3.3 mmol/L — ABNORMAL LOW (ref 3.5–5.1)
Sodium: 139 mmol/L (ref 135–145)
Total Bilirubin: 0.5 mg/dL (ref 0.3–1.2)
Total Protein: 6.3 g/dL — ABNORMAL LOW (ref 6.5–8.1)

## 2022-12-23 MED ORDER — DIPHENHYDRAMINE HCL 25 MG PO CAPS
25.0000 mg | ORAL_CAPSULE | Freq: Once | ORAL | Status: AC
  Administered 2022-12-23: 25 mg via ORAL
  Filled 2022-12-23: qty 1

## 2022-12-23 MED ORDER — DEXTROSE 5 % IV SOLN: INTRAVENOUS | Status: DC

## 2022-12-23 MED ORDER — SODIUM CHLORIDE 0.9% FLUSH
10.0000 mL | INTRAVENOUS | Status: DC | PRN
  Administered 2022-12-23: 10 mL via INTRAVENOUS

## 2022-12-23 MED ORDER — ACETAMINOPHEN 325 MG PO TABS
650.0000 mg | ORAL_TABLET | Freq: Once | ORAL | Status: AC
  Administered 2022-12-23: 650 mg via ORAL
  Filled 2022-12-23: qty 2

## 2022-12-23 MED ORDER — IMMUNE GLOBULIN (HUMAN) 20 GM/200ML IV SOLN
400.0000 mg/kg | Freq: Once | INTRAVENOUS | Status: AC
  Administered 2022-12-23: 5 g via INTRAVENOUS
  Filled 2022-12-23: qty 50

## 2022-12-23 MED ORDER — HEPARIN SOD (PORK) LOCK FLUSH 100 UNIT/ML IV SOLN
500.0000 [IU] | Freq: Once | INTRAVENOUS | Status: AC
  Administered 2022-12-23: 500 [IU] via INTRAVENOUS

## 2022-12-23 NOTE — Patient Instructions (Signed)

## 2022-12-23 NOTE — Patient Instructions (Signed)

## 2022-12-23 NOTE — Progress Notes (Signed)
Hematology and Oncology Follow Up Visit  [name] [id number] Mar 21, 1951 72 y.o. 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  We now going to get her started on IVIG.  This is a recommendation from the Rheumatologist that she sees up at [facility].  She just was up there recently.  She is having some issues swallowing.  I am really not all that surprised given that she does have scleroderma.  Of note, we do give her Aranesp.  When we checked erythropoietin level back in early May, it was only 23.  Had a last dose back on 12/12/2022.  Back in early May, her iron studies showed a ferritin of 225 with an iron saturation of 27%.  She has had no obvious bleeding.  There is been no fever.  She has had no change in bowel or bladder habits.  She has had no cough or increased shortness of breath.  She does feel tired secondary to the anemia.  Overall, I would have to say that her performance status is probably ECOG 1.   Medications:  Current Outpatient Medications:  .  acetaminophen (TYLENOL) 650 MG CR tablet, Take 650 mg by mouth daily as needed for pain., Disp: , Rfl:  .  diphenhydrAMINE (BENADRYL) 12.5 MG chewable tablet, Chew 12.5 mg by mouth 4 (four) times daily as needed for allergies., Disp: , Rfl:  .  ELDERBERRY PO, Take 1 oz by mouth 4 (four) times daily as needed. Liquid, Disp: , Rfl:  .  furosemide (LASIX) 20 MG tablet, Take 20 mg by mouth daily., Disp: , Rfl:  .  losartan (COZAAR) 25 MG tablet, Take 1 tablet (25 mg total) by mouth at bedtime., Disp: 90 tablet, Rfl: 3 .  Multiple Vitamin (MULTIVITAMIN) capsule, Take 1 capsule by mouth daily., Disp: , Rfl:  .   mycophenolate (CELLCEPT) 500 MG tablet, Take 1,500 mg by mouth 2 (two) times daily., Disp: , Rfl:  .  pantoprazole (PROTONIX) 40 MG tablet, Take 40 mg by mouth 2 (two) times daily., Disp: , Rfl:  .  sodium chloride (OCEAN) 0.65 % SOLN nasal spray, Place 1 spray into both nostrils as needed for congestion., Disp: , Rfl:  .  SYNTHROID 125 MCG tablet, Take 1 tablet (125 mcg total) by mouth daily before breakfast. *Must be the brand only*, Disp: 90 tablet, Rfl: 1  Allergies:  Allergies  Allergen Reactions  . Betadine [Povidone Iodine] Rash  . Contrast Media [Iodinated Contrast Media] Rash and Other (See Comments)    "per pt: recently had CT 08/ 2018 even through given pre-medication, IVP still caused rash"  . Epinephrine Other (See Comments)    Severe headaches   . Iodine Rash and Other (See Comments)  . Oxycodone Anxiety  . Prednisone Anaphylaxis    Per Nephrology pt should avoid this medication because she has Scleroderma and it will interfere with her Kidneys,   . Gabapentin Other (See Comments)    Dizziness  . Lidocaine Rash  . Nickel Rash and Other (See Comments)    Rash and blisters  . Other Rash and Other (See Comments)    Hospital gown  . Penicillins Rash and Other (See Comments)  Rash and blisters Has patient had a PCN reaction causing immediate rash, facial/tongue/throat swelling, SOB or lightheadedness with hypotension: Yes Has patient had a PCN reaction causing severe rash involving mucus membranes or skin necrosis: No Has patient had a PCN reaction that required hospitalization: No Has patient had a PCN reaction occurring within the last 10 years: No If all of the above answers are "NO", then may proceed with Cephalosporin use.   . Sulfa Antibiotics Rash    Past Medical History, Surgical history, Social history, and Family History were reviewed and updated.  Review of Systems: Review of Systems  Constitutional:  Positive for fatigue.  HENT:  Negative.    Eyes:  Negative.   Respiratory: Negative.    Cardiovascular: Negative.   Gastrointestinal: Negative.   Endocrine: Negative.   Genitourinary: Negative.    Musculoskeletal:  Positive for arthralgias and myalgias.  Skin:  Positive for rash.  Neurological: Negative.   Hematological: Negative.   Psychiatric/Behavioral: Negative.      Physical Exam:  height is 5\' 4"  (1.626 m) and weight is 138 lb (62.6 kg). Her oral temperature is 97.6 F (36.4 C). Her blood pressure is 143/52 (abnormal) and her pulse is 91. Her respiration is 20 and oxygen saturation is 100%.   Wt Readings from Last 3 Encounters:  12/23/22 138 lb (62.6 kg)  12/02/22 132 lb (59.9 kg)  11/18/22 133 lb (60.3 kg)    Physical Exam Vitals reviewed.  Constitutional:      Comments: Her breast exam shows right breast with no masses, edema or erythema.  There is no right axillary adenopathy.  Left breast shows the lumpectomy scar at about the 2 o'clock position.  There is little firmness at the lumpectomy site.  No distinct masses noted.  There is no left axillary adenopathy.  There is no left nipple discharge.  HENT:     Head: Normocephalic and atraumatic.  Eyes:     Pupils: Pupils are equal, round, and reactive to light.  Cardiovascular:     Rate and Rhythm: Normal rate and regular rhythm.     Heart sounds: Normal heart sounds.  Pulmonary:     Effort: Pulmonary effort is normal.     Breath sounds: Normal breath sounds.  Abdominal:     General: Bowel sounds are normal.     Palpations: Abdomen is soft.  Musculoskeletal:        General: No tenderness or deformity. Normal range of motion.     Cervical back: Normal range of motion.  Lymphadenopathy:     Cervical: No cervical adenopathy.  Skin:    General: Skin is warm and dry.     Findings: No erythema or rash.     Comments: Skin exam shows some changes of the scleroderma.  This is mostly on the face, around the lip area.  Her arms do show a little bit of skin thickening.   Neurological:     Mental Status: She is alert and oriented to person, place, and time.  Psychiatric:        Behavior: Behavior normal.        Thought Content: Thought content normal.        Judgment: Judgment normal.     Lab Results  Component Value Date   WBC 9.0 11/18/2022   HGB 8.8 (L) 11/18/2022   HCT 26.9 (L) 11/18/2022   MCV 92.4 11/18/2022   PLT 300 11/18/2022     Chemistry      Component Value Date/Time  NA 135 11/18/2022 1429   NA 144 01/27/2020 1148   NA 141 06/09/2017 1117   K 3.9 11/18/2022 1429   K 3.9 06/09/2017 1117   CL 103 11/18/2022 1429   CL 109 (H) 12/14/2012 1253   CO2 23 11/18/2022 1429   CO2 23 06/09/2017 1117   BUN 20 11/18/2022 1429   BUN 11 01/27/2020 1148   BUN 11.5 06/09/2017 1117   CREATININE 1.32 (H) 11/18/2022 1429   CREATININE 1.2 (H) 06/09/2017 1117      Component Value Date/Time   CALCIUM 9.5 11/18/2022 1429   CALCIUM 8.9 06/09/2017 1117   ALKPHOS 52 11/18/2022 1429   ALKPHOS 40 06/09/2017 1117   AST 42 (H) 11/18/2022 1429   AST 35 (H) 06/09/2017 1117   ALT 20 11/18/2022 1429   ALT 19 06/09/2017 1117   BILITOT 0.4 11/18/2022 1429   BILITOT 0.37 06/09/2017 1117      Impression and Plan: [name] is a very charming 72 year old white female.  She has had a history of early stage breast cancer.  So far, we have not found any problems with respect to recurrent disease.  We always have to check her CA 27.29.  Her biggest issue is the scleroderma.  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  She had her medications readjusted.  She is clearly anemic.  Her hemoglobin is quite low.  We really have to be aggressive with the Aranesp.  I just want her quality of life to be better.  Hopefully, the IVIG will help with the scleroderma.  We will just have to see how this goes.  We will have to follow her along closely.  I suspect we will probably have to get her back to see [place] in another 2 to 3 weeks.      Josph Macho, MD 6/10/20248:16 AM

## 2022-12-24 ENCOUNTER — Inpatient Hospital Stay: Payer: Medicare Other

## 2022-12-24 VITALS — BP 142/73 | HR 80 | Temp 98.2°F | Resp 18

## 2022-12-24 DIAGNOSIS — D509 Iron deficiency anemia, unspecified: Secondary | ICD-10-CM

## 2022-12-24 DIAGNOSIS — D631 Anemia in chronic kidney disease: Secondary | ICD-10-CM | POA: Diagnosis not present

## 2022-12-24 DIAGNOSIS — Z79899 Other long term (current) drug therapy: Secondary | ICD-10-CM | POA: Diagnosis not present

## 2022-12-24 DIAGNOSIS — M349 Systemic sclerosis, unspecified: Secondary | ICD-10-CM | POA: Diagnosis not present

## 2022-12-24 DIAGNOSIS — I132 Hypertensive heart and chronic kidney disease with heart failure and with stage 5 chronic kidney disease, or end stage renal disease: Secondary | ICD-10-CM | POA: Diagnosis not present

## 2022-12-24 DIAGNOSIS — N1832 Chronic kidney disease, stage 3b: Secondary | ICD-10-CM | POA: Diagnosis not present

## 2022-12-24 DIAGNOSIS — Z79624 Long term (current) use of inhibitors of nucleotide synthesis: Secondary | ICD-10-CM | POA: Diagnosis not present

## 2022-12-24 MED ORDER — ACETAMINOPHEN 325 MG PO TABS: 650.0000 mg | ORAL_TABLET | Freq: Once | ORAL | Status: DC

## 2022-12-24 MED ORDER — DEXTROSE 5 % IV SOLN: INTRAVENOUS | Status: DC

## 2022-12-24 MED ORDER — SODIUM CHLORIDE 0.9% FLUSH
10.0000 mL | Freq: Once | INTRAVENOUS | Status: AC
  Administered 2022-12-24: 10 mL

## 2022-12-24 MED ORDER — IMMUNE GLOBULIN (HUMAN) 20 GM/200ML IV SOLN
400.0000 mg/kg | INTRAVENOUS | Status: DC
  Administered 2022-12-24: 5 g via INTRAVENOUS
  Filled 2022-12-24: qty 250

## 2022-12-24 MED ORDER — DIPHENHYDRAMINE HCL 25 MG PO CAPS: 25.0000 mg | ORAL_CAPSULE | Freq: Once | ORAL | Status: DC

## 2022-12-24 MED ORDER — HEPARIN SOD (PORK) LOCK FLUSH 100 UNIT/ML IV SOLN
500.0000 [IU] | Freq: Once | INTRAVENOUS | Status: AC
  Administered 2022-12-24: 500 [IU] via INTRAVENOUS

## 2022-12-24 NOTE — Patient Instructions (Signed)

## 2022-12-25 ENCOUNTER — Inpatient Hospital Stay: Payer: Medicare Other

## 2022-12-25 VITALS — BP 149/59 | HR 80 | Temp 97.7°F | Resp 20

## 2022-12-25 DIAGNOSIS — D631 Anemia in chronic kidney disease: Secondary | ICD-10-CM | POA: Diagnosis not present

## 2022-12-25 DIAGNOSIS — N1832 Chronic kidney disease, stage 3b: Secondary | ICD-10-CM | POA: Diagnosis not present

## 2022-12-25 DIAGNOSIS — M349 Systemic sclerosis, unspecified: Secondary | ICD-10-CM | POA: Diagnosis not present

## 2022-12-25 DIAGNOSIS — Z79899 Other long term (current) drug therapy: Secondary | ICD-10-CM | POA: Diagnosis not present

## 2022-12-25 DIAGNOSIS — I132 Hypertensive heart and chronic kidney disease with heart failure and with stage 5 chronic kidney disease, or end stage renal disease: Secondary | ICD-10-CM | POA: Diagnosis not present

## 2022-12-25 DIAGNOSIS — Z79624 Long term (current) use of inhibitors of nucleotide synthesis: Secondary | ICD-10-CM | POA: Diagnosis not present

## 2022-12-25 DIAGNOSIS — D509 Iron deficiency anemia, unspecified: Secondary | ICD-10-CM

## 2022-12-25 MED ORDER — SODIUM CHLORIDE 0.9% FLUSH
10.0000 mL | INTRAVENOUS | Status: DC | PRN
  Administered 2022-12-25: 10 mL via INTRAVENOUS

## 2022-12-25 MED ORDER — ACETAMINOPHEN 325 MG PO TABS: 650.0000 mg | ORAL_TABLET | Freq: Once | ORAL | Status: DC

## 2022-12-25 MED ORDER — DIPHENHYDRAMINE HCL 25 MG PO CAPS: 25.0000 mg | ORAL_CAPSULE | Freq: Once | ORAL | Status: DC

## 2022-12-25 MED ORDER — HEPARIN SOD (PORK) LOCK FLUSH 100 UNIT/ML IV SOLN
500.0000 [IU] | Freq: Once | INTRAVENOUS | Status: AC
  Administered 2022-12-25: 500 [IU] via INTRAVENOUS

## 2022-12-25 MED ORDER — DEXTROSE 5 % IV SOLN: INTRAVENOUS | Status: DC

## 2022-12-25 MED ORDER — IMMUNE GLOBULIN (HUMAN) 20 GM/200ML IV SOLN
400.0000 mg/kg | INTRAVENOUS | Status: DC
  Administered 2022-12-25: 5 g via INTRAVENOUS
  Filled 2022-12-25: qty 50

## 2022-12-25 NOTE — Patient Instructions (Signed)

## 2022-12-26 ENCOUNTER — Other Ambulatory Visit: Payer: Self-pay | Admitting: *Deleted

## 2022-12-26 ENCOUNTER — Inpatient Hospital Stay: Payer: Medicare Other

## 2022-12-26 VITALS — BP 157/70 | HR 86 | Temp 98.0°F | Resp 17

## 2022-12-26 DIAGNOSIS — I132 Hypertensive heart and chronic kidney disease with heart failure and with stage 5 chronic kidney disease, or end stage renal disease: Secondary | ICD-10-CM | POA: Diagnosis not present

## 2022-12-26 DIAGNOSIS — N1831 Chronic kidney disease, stage 3a: Secondary | ICD-10-CM

## 2022-12-26 DIAGNOSIS — M349 Systemic sclerosis, unspecified: Secondary | ICD-10-CM | POA: Diagnosis not present

## 2022-12-26 DIAGNOSIS — Z79624 Long term (current) use of inhibitors of nucleotide synthesis: Secondary | ICD-10-CM | POA: Diagnosis not present

## 2022-12-26 DIAGNOSIS — R0609 Other forms of dyspnea: Secondary | ICD-10-CM | POA: Diagnosis not present

## 2022-12-26 DIAGNOSIS — D509 Iron deficiency anemia, unspecified: Secondary | ICD-10-CM

## 2022-12-26 DIAGNOSIS — Z79899 Other long term (current) drug therapy: Secondary | ICD-10-CM | POA: Diagnosis not present

## 2022-12-26 DIAGNOSIS — N1832 Chronic kidney disease, stage 3b: Secondary | ICD-10-CM | POA: Diagnosis not present

## 2022-12-26 DIAGNOSIS — D631 Anemia in chronic kidney disease: Secondary | ICD-10-CM | POA: Diagnosis not present

## 2022-12-26 LAB — CMP (CANCER CENTER ONLY)
ALT: 19 U/L (ref 0–44)
AST: 45 U/L — ABNORMAL HIGH (ref 15–41)
Albumin: 3.6 g/dL (ref 3.5–5.0)
Alkaline Phosphatase: 44 U/L (ref 38–126)
Anion gap: 6 (ref 5–15)
BUN: 14 mg/dL (ref 8–23)
CO2: 26 mmol/L (ref 22–32)
Calcium: 9.3 mg/dL (ref 8.9–10.3)
Chloride: 104 mmol/L (ref 98–111)
Creatinine: 1.12 mg/dL — ABNORMAL HIGH (ref 0.44–1.00)
GFR, Estimated: 52 mL/min — ABNORMAL LOW (ref >60)
Glucose, Bld: 109 mg/dL — ABNORMAL HIGH (ref 70–99)
Potassium: 3.3 mmol/L — ABNORMAL LOW (ref 3.5–5.1)
Sodium: 136 mmol/L (ref 135–145)
Total Bilirubin: 0.4 mg/dL (ref 0.3–1.2)
Total Protein: 7.2 g/dL (ref 6.5–8.1)

## 2022-12-26 MED ORDER — HEPARIN SOD (PORK) LOCK FLUSH 100 UNIT/ML IV SOLN
500.0000 [IU] | Freq: Once | INTRAVENOUS | Status: AC
  Administered 2022-12-26: 500 [IU] via INTRAVENOUS

## 2022-12-26 MED ORDER — DIPHENHYDRAMINE HCL 25 MG PO CAPS: 25.0000 mg | ORAL_CAPSULE | Freq: Once | ORAL | Status: DC

## 2022-12-26 MED ORDER — ACETAMINOPHEN 325 MG PO TABS: 650.0000 mg | ORAL_TABLET | Freq: Once | ORAL | Status: DC

## 2022-12-26 MED ORDER — IMMUNE GLOBULIN (HUMAN) 20 GM/200ML IV SOLN
400.0000 mg/kg | INTRAVENOUS | Status: DC
  Administered 2022-12-26: 25 g via INTRAVENOUS
  Filled 2022-12-26: qty 50

## 2022-12-26 MED ORDER — SODIUM CHLORIDE 0.9% FLUSH
10.0000 mL | Freq: Once | INTRAVENOUS | Status: AC
  Administered 2022-12-26: 10 mL via INTRAVENOUS

## 2022-12-26 MED ORDER — DEXTROSE 5 % IV SOLN: INTRAVENOUS | Status: DC

## 2022-12-26 NOTE — Patient Instructions (Signed)

## 2022-12-27 ENCOUNTER — Inpatient Hospital Stay: Payer: Medicare Other

## 2022-12-27 VITALS — BP 147/60 | HR 82 | Temp 97.5°F | Resp 17

## 2022-12-27 DIAGNOSIS — Z79899 Other long term (current) drug therapy: Secondary | ICD-10-CM | POA: Diagnosis not present

## 2022-12-27 DIAGNOSIS — E876 Hypokalemia: Secondary | ICD-10-CM | POA: Diagnosis not present

## 2022-12-27 DIAGNOSIS — Z79624 Long term (current) use of inhibitors of nucleotide synthesis: Secondary | ICD-10-CM | POA: Diagnosis not present

## 2022-12-27 DIAGNOSIS — N1831 Chronic kidney disease, stage 3a: Secondary | ICD-10-CM | POA: Diagnosis not present

## 2022-12-27 DIAGNOSIS — I132 Hypertensive heart and chronic kidney disease with heart failure and with stage 5 chronic kidney disease, or end stage renal disease: Secondary | ICD-10-CM | POA: Diagnosis not present

## 2022-12-27 DIAGNOSIS — Z170 Estrogen receptor positive status [ER+]: Secondary | ICD-10-CM

## 2022-12-27 DIAGNOSIS — D509 Iron deficiency anemia, unspecified: Secondary | ICD-10-CM

## 2022-12-27 DIAGNOSIS — M349 Systemic sclerosis, unspecified: Secondary | ICD-10-CM | POA: Diagnosis not present

## 2022-12-27 DIAGNOSIS — D631 Anemia in chronic kidney disease: Secondary | ICD-10-CM | POA: Diagnosis not present

## 2022-12-27 DIAGNOSIS — N1832 Chronic kidney disease, stage 3b: Secondary | ICD-10-CM | POA: Diagnosis not present

## 2022-12-27 DIAGNOSIS — I129 Hypertensive chronic kidney disease with stage 1 through stage 4 chronic kidney disease, or unspecified chronic kidney disease: Secondary | ICD-10-CM | POA: Diagnosis not present

## 2022-12-27 DIAGNOSIS — N280 Ischemia and infarction of kidney: Secondary | ICD-10-CM | POA: Diagnosis not present

## 2022-12-27 MED ORDER — IMMUNE GLOBULIN (HUMAN) 20 GM/200ML IV SOLN
400.0000 mg/kg | INTRAVENOUS | Status: DC
  Administered 2022-12-27: 5 g via INTRAVENOUS
  Filled 2022-12-27: qty 50

## 2022-12-27 MED ORDER — HEPARIN SOD (PORK) LOCK FLUSH 100 UNIT/ML IV SOLN
500.0000 [IU] | Freq: Once | INTRAVENOUS | Status: AC
  Administered 2022-12-27: 500 [IU] via INTRAVENOUS

## 2022-12-27 MED ORDER — DEXTROSE 5 % IV SOLN: INTRAVENOUS | Status: DC

## 2022-12-27 MED ORDER — SODIUM CHLORIDE 0.9% FLUSH
10.0000 mL | INTRAVENOUS | Status: DC | PRN
  Administered 2022-12-27: 10 mL via INTRAVENOUS

## 2022-12-27 MED ORDER — ACETAMINOPHEN 325 MG PO TABS: 650.0000 mg | ORAL_TABLET | Freq: Once | ORAL | Status: DC

## 2022-12-27 MED ORDER — DIPHENHYDRAMINE HCL 25 MG PO CAPS: 25.0000 mg | ORAL_CAPSULE | Freq: Once | ORAL | Status: DC

## 2022-12-27 NOTE — Patient Instructions (Signed)

## 2023-01-01 ENCOUNTER — Other Ambulatory Visit: Payer: Self-pay

## 2023-01-01 DIAGNOSIS — M62838 Other muscle spasm: Secondary | ICD-10-CM | POA: Diagnosis not present

## 2023-01-01 DIAGNOSIS — M25551 Pain in right hip: Secondary | ICD-10-CM | POA: Diagnosis not present

## 2023-01-01 DIAGNOSIS — C50412 Malignant neoplasm of upper-outer quadrant of left female breast: Secondary | ICD-10-CM

## 2023-01-01 DIAGNOSIS — M6281 Muscle weakness (generalized): Secondary | ICD-10-CM | POA: Diagnosis not present

## 2023-01-01 DIAGNOSIS — R2681 Unsteadiness on feet: Secondary | ICD-10-CM | POA: Diagnosis not present

## 2023-01-02 ENCOUNTER — Inpatient Hospital Stay: Payer: Medicare Other

## 2023-01-02 VITALS — BP 129/54 | HR 83 | Temp 98.0°F | Resp 18

## 2023-01-02 DIAGNOSIS — Z79899 Other long term (current) drug therapy: Secondary | ICD-10-CM | POA: Diagnosis not present

## 2023-01-02 DIAGNOSIS — I132 Hypertensive heart and chronic kidney disease with heart failure and with stage 5 chronic kidney disease, or end stage renal disease: Secondary | ICD-10-CM | POA: Diagnosis not present

## 2023-01-02 DIAGNOSIS — C50412 Malignant neoplasm of upper-outer quadrant of left female breast: Secondary | ICD-10-CM

## 2023-01-02 DIAGNOSIS — Z79624 Long term (current) use of inhibitors of nucleotide synthesis: Secondary | ICD-10-CM | POA: Diagnosis not present

## 2023-01-02 DIAGNOSIS — N1832 Chronic kidney disease, stage 3b: Secondary | ICD-10-CM | POA: Diagnosis not present

## 2023-01-02 DIAGNOSIS — D631 Anemia in chronic kidney disease: Secondary | ICD-10-CM | POA: Diagnosis not present

## 2023-01-02 DIAGNOSIS — Z95828 Presence of other vascular implants and grafts: Secondary | ICD-10-CM

## 2023-01-02 DIAGNOSIS — M349 Systemic sclerosis, unspecified: Secondary | ICD-10-CM | POA: Diagnosis not present

## 2023-01-02 DIAGNOSIS — D509 Iron deficiency anemia, unspecified: Secondary | ICD-10-CM

## 2023-01-02 LAB — CBC WITH DIFFERENTIAL (CANCER CENTER ONLY)
Abs Immature Granulocytes: 0.02 10*3/uL (ref 0.00–0.07)
Basophils Absolute: 0.1 10*3/uL (ref 0.0–0.1)
Basophils Relative: 1 %
Eosinophils Absolute: 0.2 10*3/uL (ref 0.0–0.5)
Eosinophils Relative: 2 %
HCT: 29.5 % — ABNORMAL LOW (ref 36.0–46.0)
Hemoglobin: 9.1 g/dL — ABNORMAL LOW (ref 12.0–15.0)
Immature Granulocytes: 0 %
Lymphocytes Relative: 8 %
Lymphs Abs: 0.6 10*3/uL — ABNORMAL LOW (ref 0.7–4.0)
MCH: 30.4 pg (ref 26.0–34.0)
MCHC: 30.8 g/dL (ref 30.0–36.0)
MCV: 98.7 fL (ref 80.0–100.0)
Monocytes Absolute: 0.5 10*3/uL (ref 0.1–1.0)
Monocytes Relative: 8 %
Neutro Abs: 5.5 10*3/uL (ref 1.7–7.7)
Neutrophils Relative %: 81 %
Platelet Count: 278 10*3/uL (ref 150–400)
RBC: 2.99 MIL/uL — ABNORMAL LOW (ref 3.87–5.11)
RDW: 14.2 % (ref 11.5–15.5)
WBC Count: 6.8 10*3/uL (ref 4.0–10.5)
nRBC: 0 % (ref 0.0–0.2)

## 2023-01-02 LAB — CMP (CANCER CENTER ONLY)
ALT: 26 U/L (ref 0–44)
AST: 56 U/L — ABNORMAL HIGH (ref 15–41)
Albumin: 3.7 g/dL (ref 3.5–5.0)
Alkaline Phosphatase: 51 U/L (ref 38–126)
Anion gap: 7 (ref 5–15)
BUN: 16 mg/dL (ref 8–23)
CO2: 26 mmol/L (ref 22–32)
Calcium: 9.4 mg/dL (ref 8.9–10.3)
Chloride: 104 mmol/L (ref 98–111)
Creatinine: 1.05 mg/dL — ABNORMAL HIGH (ref 0.44–1.00)
GFR, Estimated: 56 mL/min — ABNORMAL LOW (ref >60)
Glucose, Bld: 119 mg/dL — ABNORMAL HIGH (ref 70–99)
Potassium: 3.3 mmol/L — ABNORMAL LOW (ref 3.5–5.1)
Sodium: 137 mmol/L (ref 135–145)
Total Bilirubin: 0.3 mg/dL (ref 0.3–1.2)
Total Protein: 7.1 g/dL (ref 6.5–8.1)

## 2023-01-02 MED ORDER — HEPARIN SOD (PORK) LOCK FLUSH 100 UNIT/ML IV SOLN
500.0000 [IU] | Freq: Once | INTRAVENOUS | Status: AC
  Administered 2023-01-02: 500 [IU] via INTRAVENOUS

## 2023-01-02 MED ORDER — DARBEPOETIN ALFA 300 MCG/0.6ML IJ SOSY
300.0000 ug | PREFILLED_SYRINGE | Freq: Once | INTRAMUSCULAR | Status: AC
  Administered 2023-01-02: 300 ug via SUBCUTANEOUS
  Filled 2023-01-02: qty 0.6

## 2023-01-02 MED ORDER — SODIUM CHLORIDE 0.9% FLUSH
10.0000 mL | Freq: Once | INTRAVENOUS | Status: AC
  Administered 2023-01-02: 10 mL via INTRAVENOUS

## 2023-01-02 NOTE — Patient Instructions (Signed)

## 2023-01-02 NOTE — Patient Instructions (Signed)

## 2023-01-03 LAB — IGG, IGA, IGM
IgA: 122 mg/dL (ref 64–422)
IgG (Immunoglobin G), Serum: 2003 mg/dL — ABNORMAL HIGH (ref 586–1602)
IgM (Immunoglobulin M), Srm: 46 mg/dL (ref 26–217)

## 2023-01-06 ENCOUNTER — Encounter: Payer: Self-pay | Admitting: Hematology & Oncology

## 2023-01-08 DIAGNOSIS — R600 Localized edema: Secondary | ICD-10-CM | POA: Diagnosis not present

## 2023-01-08 DIAGNOSIS — M3489 Other systemic sclerosis: Secondary | ICD-10-CM | POA: Diagnosis not present

## 2023-01-08 DIAGNOSIS — M79645 Pain in left finger(s): Secondary | ICD-10-CM | POA: Diagnosis not present

## 2023-01-08 DIAGNOSIS — M79642 Pain in left hand: Secondary | ICD-10-CM | POA: Diagnosis not present

## 2023-01-08 DIAGNOSIS — M2560 Stiffness of unspecified joint, not elsewhere classified: Secondary | ICD-10-CM | POA: Diagnosis not present

## 2023-01-08 DIAGNOSIS — R531 Weakness: Secondary | ICD-10-CM | POA: Diagnosis not present

## 2023-01-08 DIAGNOSIS — M25641 Stiffness of right hand, not elsewhere classified: Secondary | ICD-10-CM | POA: Diagnosis not present

## 2023-01-08 DIAGNOSIS — M79641 Pain in right hand: Secondary | ICD-10-CM | POA: Diagnosis not present

## 2023-01-08 DIAGNOSIS — M25642 Stiffness of left hand, not elsewhere classified: Secondary | ICD-10-CM | POA: Diagnosis not present

## 2023-01-08 DIAGNOSIS — M349 Systemic sclerosis, unspecified: Secondary | ICD-10-CM | POA: Diagnosis not present

## 2023-01-08 DIAGNOSIS — M79644 Pain in right finger(s): Secondary | ICD-10-CM | POA: Diagnosis not present

## 2023-01-20 ENCOUNTER — Inpatient Hospital Stay: Payer: Medicare Other | Attending: Hematology & Oncology

## 2023-01-20 ENCOUNTER — Inpatient Hospital Stay: Payer: Medicare Other

## 2023-01-20 ENCOUNTER — Inpatient Hospital Stay (HOSPITAL_BASED_OUTPATIENT_CLINIC_OR_DEPARTMENT_OTHER): Payer: Medicare Other | Admitting: Hematology & Oncology

## 2023-01-20 VITALS — BP 107/57 | HR 84 | Temp 98.0°F | Resp 18 | Ht 64.0 in | Wt 134.0 lb

## 2023-01-20 VITALS — BP 122/54 | HR 70

## 2023-01-20 DIAGNOSIS — D638 Anemia in other chronic diseases classified elsewhere: Secondary | ICD-10-CM | POA: Insufficient documentation

## 2023-01-20 DIAGNOSIS — I3139 Other pericardial effusion (noninflammatory): Secondary | ICD-10-CM | POA: Diagnosis not present

## 2023-01-20 DIAGNOSIS — Z923 Personal history of irradiation: Secondary | ICD-10-CM | POA: Diagnosis not present

## 2023-01-20 DIAGNOSIS — M349 Systemic sclerosis, unspecified: Secondary | ICD-10-CM | POA: Insufficient documentation

## 2023-01-20 DIAGNOSIS — R7989 Other specified abnormal findings of blood chemistry: Secondary | ICD-10-CM | POA: Insufficient documentation

## 2023-01-20 DIAGNOSIS — D509 Iron deficiency anemia, unspecified: Secondary | ICD-10-CM

## 2023-01-20 DIAGNOSIS — R978 Other abnormal tumor markers: Secondary | ICD-10-CM | POA: Insufficient documentation

## 2023-01-20 DIAGNOSIS — Z853 Personal history of malignant neoplasm of breast: Secondary | ICD-10-CM | POA: Insufficient documentation

## 2023-01-20 DIAGNOSIS — Z170 Estrogen receptor positive status [ER+]: Secondary | ICD-10-CM

## 2023-01-20 DIAGNOSIS — R799 Abnormal finding of blood chemistry, unspecified: Secondary | ICD-10-CM

## 2023-01-20 LAB — CBC WITH DIFFERENTIAL (CANCER CENTER ONLY)
Abs Immature Granulocytes: 0.08 10*3/uL — ABNORMAL HIGH (ref 0.00–0.07)
Basophils Absolute: 0.1 10*3/uL (ref 0.0–0.1)
Basophils Relative: 1 %
Eosinophils Absolute: 0.2 10*3/uL (ref 0.0–0.5)
Eosinophils Relative: 2 %
HCT: 32.4 % — ABNORMAL LOW (ref 36.0–46.0)
Hemoglobin: 9.9 g/dL — ABNORMAL LOW (ref 12.0–15.0)
Immature Granulocytes: 1 %
Lymphocytes Relative: 9 %
Lymphs Abs: 0.6 10*3/uL — ABNORMAL LOW (ref 0.7–4.0)
MCH: 29.9 pg (ref 26.0–34.0)
MCHC: 30.6 g/dL (ref 30.0–36.0)
MCV: 97.9 fL (ref 80.0–100.0)
Monocytes Absolute: 0.5 10*3/uL (ref 0.1–1.0)
Monocytes Relative: 8 %
Neutro Abs: 5.3 10*3/uL (ref 1.7–7.7)
Neutrophils Relative %: 79 %
Platelet Count: 262 10*3/uL (ref 150–400)
RBC: 3.31 MIL/uL — ABNORMAL LOW (ref 3.87–5.11)
RDW: 14.5 % (ref 11.5–15.5)
WBC Count: 6.7 10*3/uL (ref 4.0–10.5)
nRBC: 0 % (ref 0.0–0.2)

## 2023-01-20 LAB — RETICULOCYTES
Immature Retic Fract: 8.1 % (ref 2.3–15.9)
RBC.: 3.29 MIL/uL — ABNORMAL LOW (ref 3.87–5.11)
Retic Count, Absolute: 77 10*3/uL (ref 19.0–186.0)
Retic Ct Pct: 2.3 % (ref 0.4–3.1)

## 2023-01-20 LAB — FERRITIN: Ferritin: 119 ng/mL (ref 11–307)

## 2023-01-20 LAB — CMP (CANCER CENTER ONLY)
ALT: 23 U/L (ref 0–44)
AST: 53 U/L — ABNORMAL HIGH (ref 15–41)
Albumin: 3.9 g/dL (ref 3.5–5.0)
Alkaline Phosphatase: 50 U/L (ref 38–126)
Anion gap: 9 (ref 5–15)
BUN: 18 mg/dL (ref 8–23)
CO2: 27 mmol/L (ref 22–32)
Calcium: 9.8 mg/dL (ref 8.9–10.3)
Chloride: 102 mmol/L (ref 98–111)
Creatinine: 1.15 mg/dL — ABNORMAL HIGH (ref 0.44–1.00)
GFR, Estimated: 50 mL/min — ABNORMAL LOW (ref >60)
Glucose, Bld: 113 mg/dL — ABNORMAL HIGH (ref 70–99)
Potassium: 3.6 mmol/L (ref 3.5–5.1)
Sodium: 138 mmol/L (ref 135–145)
Total Bilirubin: 0.5 mg/dL (ref 0.3–1.2)
Total Protein: 7.1 g/dL (ref 6.5–8.1)

## 2023-01-20 LAB — IRON AND IRON BINDING CAPACITY (CC-WL,HP ONLY)
Iron: 61 ug/dL (ref 28–170)
Saturation Ratios: 23 % (ref 10.4–31.8)
TIBC: 272 ug/dL (ref 250–450)
UIBC: 211 ug/dL (ref 148–442)

## 2023-01-20 LAB — LACTATE DEHYDROGENASE: LDH: 288 U/L — ABNORMAL HIGH (ref 98–192)

## 2023-01-20 MED ORDER — DEXTROSE 5 % IV SOLN: INTRAVENOUS | Status: DC

## 2023-01-20 MED ORDER — IMMUNE GLOBULIN (HUMAN) 20 GM/200ML IV SOLN
25.0000 g | Freq: Once | INTRAVENOUS | Status: AC
  Administered 2023-01-20: 25 g via INTRAVENOUS
  Filled 2023-01-20: qty 250

## 2023-01-20 MED ORDER — DIPHENHYDRAMINE HCL 25 MG PO CAPS: 25.0000 mg | ORAL_CAPSULE | Freq: Once | ORAL | Status: DC

## 2023-01-20 MED ORDER — ACETAMINOPHEN 325 MG PO TABS: 650.0000 mg | ORAL_TABLET | Freq: Once | ORAL | Status: DC

## 2023-01-20 NOTE — Patient Instructions (Signed)

## 2023-01-20 NOTE — Progress Notes (Signed)
Hematology and Oncology Follow Up Visit  [name] [id number] [birth year] 72 y.o. 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  We now going to get her started on IVIG.  This is a recommendation from the Rheumatologist that she sees up at [facility].  She just was up there recently.  She is having some issues swallowing.  I am really not all that surprised given that she does have scleroderma.  Of note, we do give her Aranesp.  When we checked erythropoietin level back in early May, it was only 23.  Had a last dose back on 12/12/2022.  Back in early May, her iron studies showed a ferritin of 225 with an iron saturation of 27%.  She has had no obvious bleeding.  There is been no fever.  She has had no change in bowel or bladder habits.  She has had no cough or increased shortness of breath.  She does feel tired secondary to the anemia.  Overall, I would have to say that her performance status is probably ECOG 1.   Medications:  Current Outpatient Medications:  .  acetaminophen (TYLENOL) 650 MG CR tablet, Take 650 mg by mouth daily as needed for pain., Disp: , Rfl:  .  diphenhydrAMINE (BENADRYL) 12.5 MG chewable tablet, Chew 12.5 mg by mouth 4 (four) times daily as needed for allergies., Disp: , Rfl:  .  ELDERBERRY PO, Take 1 oz by mouth 4 (four) times daily as needed. Liquid, Disp: , Rfl:  .  furosemide (LASIX) 20 MG tablet, Take 20 mg by mouth daily., Disp: , Rfl:  .  losartan (COZAAR) 25 MG tablet, Take 1 tablet (25 mg total) by mouth at bedtime., Disp: 90 tablet, Rfl: 3 .  Multiple Vitamin (MULTIVITAMIN) capsule, Take 1 capsule by mouth daily., Disp: , Rfl:  .   mycophenolate (CELLCEPT) 500 MG tablet, Take 1,500 mg by mouth 2 (two) times daily., Disp: , Rfl:  .  pantoprazole (PROTONIX) 40 MG tablet, Take 40 mg by mouth 2 (two) times daily., Disp: , Rfl:  .  sodium chloride (OCEAN) 0.65 % SOLN nasal spray, Place 1 spray into both nostrils as needed for congestion., Disp: , Rfl:  .  SYNTHROID 125 MCG tablet, Take 1 tablet (125 mcg total) by mouth daily before breakfast. *Must be the brand only*, Disp: 90 tablet, Rfl: 1  Allergies:  Allergies  Allergen Reactions  . Betadine [Povidone Iodine] Rash  . Contrast Media [Iodinated Contrast Media] Rash and Other (See Comments)    "per pt: recently had CT 08/ 2018 even through given pre-medication, IVP still caused rash"  . Epinephrine Other (See Comments)    Severe headaches   . Iodine Rash and Other (See Comments)  . Oxycodone Anxiety  . Prednisone Anaphylaxis    Per Nephrology pt should avoid this medication because she has Scleroderma and it will interfere with her Kidneys,   . Gabapentin Other (See Comments)    Dizziness  . Lidocaine Rash  . Nickel Rash and Other (See Comments)    Rash and blisters  . Other Rash and Other (See Comments)    Hospital gown  . Penicillins Rash and Other (See Comments)  Rash and blisters Has patient had a PCN reaction causing immediate rash, facial/tongue/throat swelling, SOB or lightheadedness with hypotension: Yes Has patient had a PCN reaction causing severe rash involving mucus membranes or skin necrosis: No Has patient had a PCN reaction that required hospitalization: No Has patient had a PCN reaction occurring within the last 10 years: No If all of the above answers are "NO", then may proceed with Cephalosporin use.   . Sulfa Antibiotics Rash    Past Medical History, Surgical history, Social history, and Family History were reviewed and updated.  Review of Systems: Review of Systems  Constitutional:  Positive for fatigue.  HENT:  Negative.    Eyes:  Negative.   Respiratory: Negative.    Cardiovascular: Negative.   Gastrointestinal: Negative.   Endocrine: Negative.   Genitourinary: Negative.    Musculoskeletal:  Positive for arthralgias and myalgias.  Skin:  Positive for rash.  Neurological: Negative.   Hematological: Negative.   Psychiatric/Behavioral: Negative.      Physical Exam:  height is 5\' 4"  (1.626 m) and weight is 134 lb (60.8 kg). Her oral temperature is 98 F (36.7 C). Her blood pressure is 107/57 (abnormal) and her pulse is 84. Her respiration is 18 and oxygen saturation is 100%.   Wt Readings from Last 3 Encounters:  01/20/23 134 lb (60.8 kg)  12/23/22 138 lb (62.6 kg)  12/02/22 132 lb (59.9 kg)    Physical Exam Vitals reviewed.  Constitutional:      Comments: Her breast exam shows right breast with no masses, edema or erythema.  There is no right axillary adenopathy.  Left breast shows the lumpectomy scar at about the 2 o'clock position.  There is little firmness at the lumpectomy site.  No distinct masses noted.  There is no left axillary adenopathy.  There is no left nipple discharge.  HENT:     Head: Normocephalic and atraumatic.  Eyes:     Pupils: Pupils are equal, round, and reactive to light.  Cardiovascular:     Rate and Rhythm: Normal rate and regular rhythm.     Heart sounds: Normal heart sounds.  Pulmonary:     Effort: Pulmonary effort is normal.     Breath sounds: Normal breath sounds.  Abdominal:     General: Bowel sounds are normal.     Palpations: Abdomen is soft.  Musculoskeletal:        General: No tenderness or deformity. Normal range of motion.     Cervical back: Normal range of motion.  Lymphadenopathy:     Cervical: No cervical adenopathy.  Skin:    General: Skin is warm and dry.     Findings: No erythema or rash.     Comments: Skin exam shows some changes of the scleroderma.  This is mostly on the face, around the lip area.  Her arms do show a little bit of skin thickening.   Neurological:     Mental Status: She is alert and oriented to person, place, and time.  Psychiatric:        Behavior: Behavior normal.        Thought Content: Thought content normal.        Judgment: Judgment normal.     Lab Results  Component Value Date   WBC 6.7 01/20/2023   HGB 9.9 (L) 01/20/2023   HCT 32.4 (L) 01/20/2023   MCV 97.9 01/20/2023   PLT 262 01/20/2023     Chemistry      Component Value Date/Time  NA 137 01/02/2023 1005   NA 144 01/27/2020 1148   NA 141 06/09/2017 1117   K 3.3 (L) 01/02/2023 1005   K 3.9 06/09/2017 1117   CL 104 01/02/2023 1005   CL 109 (H) 12/14/2012 1253   CO2 26 01/02/2023 1005   CO2 23 06/09/2017 1117   BUN 16 01/02/2023 1005   BUN 11 01/27/2020 1148   BUN 11.5 06/09/2017 1117   CREATININE 1.05 (H) 01/02/2023 1005   CREATININE 1.2 (H) 06/09/2017 1117      Component Value Date/Time   CALCIUM 9.4 01/02/2023 1005   CALCIUM 8.9 06/09/2017 1117   ALKPHOS 51 01/02/2023 1005   ALKPHOS 40 06/09/2017 1117   AST 56 (H) 01/02/2023 1005   AST 35 (H) 06/09/2017 1117   ALT 26 01/02/2023 1005   ALT 19 06/09/2017 1117   BILITOT 0.3 01/02/2023 1005   BILITOT 0.37 06/09/2017 1117      Impression and Plan: Ms. Dossantos is a very charming 72 year old white female.  She has had a history of early stage breast cancer.  So far, we have not found any problems with respect to recurrent disease.  We always have to check her CA 27.29.  Her biggest issue is the scleroderma.  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  She had her medications readjusted.  She is clearly anemic.  Her hemoglobin is quite low.  We really have to be aggressive with the Aranesp.  I just want her quality of life to be better.  Hopefully, the IVIG will help with the scleroderma.  We will just have to see how this goes.  We will have to follow her along closely.  I suspect we will probably have to get her back to see [place] in another 2 to 3 weeks.      Josph Macho, MD 7/8/20248:40 AM

## 2023-01-21 ENCOUNTER — Inpatient Hospital Stay: Payer: Medicare Other

## 2023-01-21 VITALS — BP 134/61 | HR 80 | Temp 98.5°F | Resp 18

## 2023-01-21 DIAGNOSIS — Z853 Personal history of malignant neoplasm of breast: Secondary | ICD-10-CM | POA: Diagnosis not present

## 2023-01-21 DIAGNOSIS — M349 Systemic sclerosis, unspecified: Secondary | ICD-10-CM | POA: Diagnosis not present

## 2023-01-21 DIAGNOSIS — Z923 Personal history of irradiation: Secondary | ICD-10-CM | POA: Diagnosis not present

## 2023-01-21 DIAGNOSIS — R7989 Other specified abnormal findings of blood chemistry: Secondary | ICD-10-CM | POA: Diagnosis not present

## 2023-01-21 DIAGNOSIS — D638 Anemia in other chronic diseases classified elsewhere: Secondary | ICD-10-CM | POA: Diagnosis not present

## 2023-01-21 DIAGNOSIS — R978 Other abnormal tumor markers: Secondary | ICD-10-CM | POA: Diagnosis not present

## 2023-01-21 DIAGNOSIS — D509 Iron deficiency anemia, unspecified: Secondary | ICD-10-CM

## 2023-01-21 LAB — IGG, IGA, IGM
IgA: 134 mg/dL (ref 64–422)
IgG (Immunoglobin G), Serum: 1418 mg/dL (ref 586–1602)
IgM (Immunoglobulin M), Srm: 49 mg/dL (ref 26–217)

## 2023-01-21 LAB — KAPPA/LAMBDA LIGHT CHAINS
Kappa free light chain: 19.9 mg/L — ABNORMAL HIGH (ref 3.3–19.4)
Kappa, lambda light chain ratio: 1.16 (ref 0.26–1.65)
Lambda free light chains: 17.1 mg/L (ref 5.7–26.3)

## 2023-01-21 LAB — CANCER ANTIGEN 27.29: CA 27.29: 48.7 U/mL — ABNORMAL HIGH (ref 0.0–38.6)

## 2023-01-21 MED ORDER — DIPHENHYDRAMINE HCL 25 MG PO CAPS
25.0000 mg | ORAL_CAPSULE | Freq: Once | ORAL | Status: AC
  Administered 2023-01-21: 25 mg via ORAL
  Filled 2023-01-21: qty 1

## 2023-01-21 MED ORDER — IMMUNE GLOBULIN (HUMAN) 20 GM/200ML IV SOLN
25.0000 g | Freq: Once | INTRAVENOUS | Status: AC
  Administered 2023-01-21: 5 g via INTRAVENOUS
  Filled 2023-01-21: qty 50

## 2023-01-21 MED ORDER — HEPARIN SOD (PORK) LOCK FLUSH 100 UNIT/ML IV SOLN
500.0000 [IU] | INTRAVENOUS | Status: AC | PRN
  Administered 2023-01-21: 500 [IU]

## 2023-01-21 MED ORDER — ACETAMINOPHEN 325 MG PO TABS
650.0000 mg | ORAL_TABLET | Freq: Once | ORAL | Status: AC
  Administered 2023-01-21: 650 mg via ORAL
  Filled 2023-01-21: qty 2

## 2023-01-21 MED ORDER — DEXTROSE 5 % IV SOLN: INTRAVENOUS | Status: DC

## 2023-01-21 MED ORDER — SODIUM CHLORIDE 0.9% FLUSH
10.0000 mL | INTRAVENOUS | Status: AC | PRN
  Administered 2023-01-21: 10 mL

## 2023-01-21 NOTE — Patient Instructions (Addendum)

## 2023-01-21 NOTE — Progress Notes (Signed)
Pt  declined to stay for post infusion observation.  VSS. Pt instructed to call center should issues occur

## 2023-01-22 ENCOUNTER — Inpatient Hospital Stay: Payer: Medicare Other

## 2023-01-22 VITALS — BP 130/67 | HR 86 | Temp 98.0°F | Resp 19

## 2023-01-22 DIAGNOSIS — D509 Iron deficiency anemia, unspecified: Secondary | ICD-10-CM

## 2023-01-22 DIAGNOSIS — D638 Anemia in other chronic diseases classified elsewhere: Secondary | ICD-10-CM | POA: Diagnosis not present

## 2023-01-22 DIAGNOSIS — Z853 Personal history of malignant neoplasm of breast: Secondary | ICD-10-CM | POA: Diagnosis not present

## 2023-01-22 DIAGNOSIS — Z923 Personal history of irradiation: Secondary | ICD-10-CM | POA: Diagnosis not present

## 2023-01-22 DIAGNOSIS — M349 Systemic sclerosis, unspecified: Secondary | ICD-10-CM | POA: Diagnosis not present

## 2023-01-22 DIAGNOSIS — R7989 Other specified abnormal findings of blood chemistry: Secondary | ICD-10-CM | POA: Diagnosis not present

## 2023-01-22 DIAGNOSIS — R978 Other abnormal tumor markers: Secondary | ICD-10-CM | POA: Diagnosis not present

## 2023-01-22 MED ORDER — ACETAMINOPHEN 325 MG PO TABS: 650.0000 mg | ORAL_TABLET | Freq: Once | ORAL | Status: DC

## 2023-01-22 MED ORDER — IMMUNE GLOBULIN (HUMAN) 20 GM/200ML IV SOLN
25.0000 g | Freq: Once | INTRAVENOUS | Status: AC
  Administered 2023-01-22: 20 g via INTRAVENOUS
  Filled 2023-01-22: qty 250

## 2023-01-22 MED ORDER — DEXTROSE 5 % IV SOLN: INTRAVENOUS | Status: DC

## 2023-01-22 MED ORDER — DIPHENHYDRAMINE HCL 25 MG PO CAPS: 25.0000 mg | ORAL_CAPSULE | Freq: Once | ORAL | Status: DC

## 2023-01-23 ENCOUNTER — Other Ambulatory Visit: Payer: Self-pay | Admitting: Hematology & Oncology

## 2023-01-23 ENCOUNTER — Inpatient Hospital Stay: Payer: Medicare Other

## 2023-01-23 VITALS — BP 125/51 | HR 80 | Temp 97.8°F | Resp 18

## 2023-01-23 DIAGNOSIS — N1831 Chronic kidney disease, stage 3a: Secondary | ICD-10-CM

## 2023-01-23 DIAGNOSIS — Z853 Personal history of malignant neoplasm of breast: Secondary | ICD-10-CM | POA: Diagnosis not present

## 2023-01-23 DIAGNOSIS — M349 Systemic sclerosis, unspecified: Secondary | ICD-10-CM | POA: Diagnosis not present

## 2023-01-23 DIAGNOSIS — R978 Other abnormal tumor markers: Secondary | ICD-10-CM | POA: Diagnosis not present

## 2023-01-23 DIAGNOSIS — R7989 Other specified abnormal findings of blood chemistry: Secondary | ICD-10-CM | POA: Diagnosis not present

## 2023-01-23 DIAGNOSIS — D638 Anemia in other chronic diseases classified elsewhere: Secondary | ICD-10-CM | POA: Diagnosis not present

## 2023-01-23 DIAGNOSIS — D509 Iron deficiency anemia, unspecified: Secondary | ICD-10-CM

## 2023-01-23 DIAGNOSIS — Z923 Personal history of irradiation: Secondary | ICD-10-CM | POA: Diagnosis not present

## 2023-01-23 MED ORDER — DIPHENHYDRAMINE HCL 25 MG PO CAPS: 25.0000 mg | ORAL_CAPSULE | Freq: Once | ORAL | Status: DC

## 2023-01-23 MED ORDER — DEXTROSE 5 % IV SOLN: INTRAVENOUS | Status: DC

## 2023-01-23 MED ORDER — SODIUM CHLORIDE 0.9% FLUSH
10.0000 mL | INTRAVENOUS | Status: DC | PRN
  Administered 2023-01-23: 10 mL via INTRAVENOUS

## 2023-01-23 MED ORDER — HEPARIN SOD (PORK) LOCK FLUSH 100 UNIT/ML IV SOLN
500.0000 [IU] | Freq: Once | INTRAVENOUS | Status: AC
  Administered 2023-01-23: 500 [IU] via INTRAVENOUS

## 2023-01-23 MED ORDER — IMMUNE GLOBULIN (HUMAN) 20 GM/200ML IV SOLN
25.0000 g | Freq: Once | INTRAVENOUS | Status: AC
  Administered 2023-01-23: 25 g via INTRAVENOUS
  Filled 2023-01-23: qty 200

## 2023-01-23 MED ORDER — DARBEPOETIN ALFA 300 MCG/0.6ML IJ SOSY
300.0000 ug | PREFILLED_SYRINGE | Freq: Once | INTRAMUSCULAR | Status: AC
  Administered 2023-01-23: 300 ug via SUBCUTANEOUS
  Filled 2023-01-23: qty 0.6

## 2023-01-23 MED ORDER — ACETAMINOPHEN 325 MG PO TABS: 650.0000 mg | ORAL_TABLET | Freq: Once | ORAL | Status: DC

## 2023-01-23 NOTE — Patient Instructions (Signed)

## 2023-01-24 ENCOUNTER — Inpatient Hospital Stay: Payer: Medicare Other

## 2023-01-24 VITALS — BP 154/84 | HR 86 | Temp 98.5°F | Resp 17 | Ht 64.0 in | Wt 129.0 lb

## 2023-01-24 DIAGNOSIS — N1831 Chronic kidney disease, stage 3a: Secondary | ICD-10-CM

## 2023-01-24 DIAGNOSIS — Z853 Personal history of malignant neoplasm of breast: Secondary | ICD-10-CM | POA: Diagnosis not present

## 2023-01-24 DIAGNOSIS — R7989 Other specified abnormal findings of blood chemistry: Secondary | ICD-10-CM | POA: Diagnosis not present

## 2023-01-24 DIAGNOSIS — R978 Other abnormal tumor markers: Secondary | ICD-10-CM | POA: Diagnosis not present

## 2023-01-24 DIAGNOSIS — Z923 Personal history of irradiation: Secondary | ICD-10-CM | POA: Diagnosis not present

## 2023-01-24 DIAGNOSIS — M349 Systemic sclerosis, unspecified: Secondary | ICD-10-CM | POA: Diagnosis not present

## 2023-01-24 DIAGNOSIS — D638 Anemia in other chronic diseases classified elsewhere: Secondary | ICD-10-CM | POA: Diagnosis not present

## 2023-01-24 DIAGNOSIS — D509 Iron deficiency anemia, unspecified: Secondary | ICD-10-CM

## 2023-01-24 LAB — CK: Total CK: 583 U/L — ABNORMAL HIGH (ref 38–234)

## 2023-01-24 MED ORDER — DIPHENHYDRAMINE HCL 25 MG PO CAPS: 25.0000 mg | ORAL_CAPSULE | Freq: Once | ORAL | Status: DC

## 2023-01-24 MED ORDER — IMMUNE GLOBULIN (HUMAN) 20 GM/200ML IV SOLN
25.0000 g | Freq: Once | INTRAVENOUS | Status: AC
  Administered 2023-01-24: 25 g via INTRAVENOUS
  Filled 2023-01-24: qty 250

## 2023-01-24 MED ORDER — ACETAMINOPHEN 325 MG PO TABS: 650.0000 mg | ORAL_TABLET | Freq: Once | ORAL | Status: DC

## 2023-01-24 MED ORDER — DEXTROSE 5 % IV SOLN: INTRAVENOUS | Status: DC

## 2023-01-24 NOTE — Patient Instructions (Signed)

## 2023-01-24 NOTE — Patient Instructions (Signed)

## 2023-01-28 DIAGNOSIS — M349 Systemic sclerosis, unspecified: Secondary | ICD-10-CM | POA: Diagnosis not present

## 2023-01-29 DIAGNOSIS — M62838 Other muscle spasm: Secondary | ICD-10-CM | POA: Diagnosis not present

## 2023-01-29 DIAGNOSIS — M6281 Muscle weakness (generalized): Secondary | ICD-10-CM | POA: Diagnosis not present

## 2023-01-29 DIAGNOSIS — R2681 Unsteadiness on feet: Secondary | ICD-10-CM | POA: Diagnosis not present

## 2023-01-29 DIAGNOSIS — M25551 Pain in right hip: Secondary | ICD-10-CM | POA: Diagnosis not present

## 2023-02-06 ENCOUNTER — Encounter: Payer: Self-pay | Admitting: Internal Medicine

## 2023-02-06 ENCOUNTER — Ambulatory Visit (INDEPENDENT_AMBULATORY_CARE_PROVIDER_SITE_OTHER): Payer: Medicare Other | Admitting: Internal Medicine

## 2023-02-06 VITALS — BP 118/78 | HR 95 | Ht 64.0 in | Wt 132.0 lb

## 2023-02-06 DIAGNOSIS — M349 Systemic sclerosis, unspecified: Secondary | ICD-10-CM

## 2023-02-06 DIAGNOSIS — K2100 Gastro-esophageal reflux disease with esophagitis, without bleeding: Secondary | ICD-10-CM | POA: Diagnosis not present

## 2023-02-06 DIAGNOSIS — Z86010 Personal history of colonic polyps: Secondary | ICD-10-CM | POA: Diagnosis not present

## 2023-02-06 DIAGNOSIS — K224 Dyskinesia of esophagus: Secondary | ICD-10-CM | POA: Diagnosis not present

## 2023-02-06 NOTE — Progress Notes (Signed)
Subjective:    Patient ID: [name], female    DOB: Jan 25, 1951, 72 y.o.   MRN: [id number]  HPI [name] is a 72 year old female with a history of scleroderma with systemic sclerosis, previous breast cancer, GERD with reflux esophagitis and hiatal hernia, colonic polyps, hypothyroidism, hyperlipidemia who is seen for follow-up.  She is here today with her husband.  She was last seen here on 09/26/2022 when Dr. Lavon Paganini performed an upper endoscopy.  EGD revealed LA grade C esophagitis, benign-appearing stricture dilated to 18 mm.  2 superficial esophageal ulcerations which were biopsied.  A 3 cm hiatal hernia and patchy gastric inflammation.  Biopsies from the esophagus showed squamocolumnar mucosa with reactive changes and acute inflammation.  No eosinophilia or dysplasia.  No metaplasia.  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  Her dysphagia symptom is slowly improving.  She is not having abdominal pain and her bowels have been regular.  She required a near weeklong hospitalization in January with a flare of her scleroderma.  There was scleroderma associated acute kidney injury, muscle weakness as well as possible cardiopulmonary involvement.  She has been started on and continues on IVIG which is a 5-hour infusion 5 days a week for each cycle.  She had been off CellCept due to shingles but is back on CellCept now.  She will follow-up with rheumatology both at [facility] and at [facility] in the coming months.  She is also seeing Dr. Gala Romney for cardiology.  She has a cardiac MRI pending for early August.  Review of Systems As per HPI, otherwise negative  Current Medications, Allergies, Past Medical History, Past Surgical History, Family History and Social History were reviewed in [employer] record.    Objective:   Physical Exam BP 118/78   Pulse 95   Ht 5\' 4"  (1.626 m)   Wt 132 lb (59.9 kg)   BMI 22.66 kg/m  Gen: awake, alert, NAD HEENT: anicteric  Neuro: nonfocal  CK remains elevated at 583 Ferritin 119 AST 53, ALT 23, alk phos 50, total bili 0.5 Hemoglobin 9.9, MCV 97.9, platelet count 262, white count 6.7    Assessment & Plan:  72 year old female with a history of scleroderma with systemic sclerosis, previous breast cancer, GERD with reflux esophagitis and hiatal hernia, colonic polyps, hypothyroidism, hyperlipidemia who is seen for follow-up.   GERD with reflux esophagitis/hiatal hernia/scleroderma induced dysmotility --we discussed how likely scleroderma is the predominant cause of her poor esophageal motility.  Fortunately this has improved some with her current therapy of IVIG and reintroduction of CellCept.  We had considered repeat EGD but given her improvement and ongoing therapy we will defer this for now.  She is in complete agreement -- Continue twice daily pantoprazole -- Sleep propped up to 30 degrees of possible -- Avoid eating and drinking near bedtime -- If symptoms worsen prior to follow-up she is asked to notify me  2.  History of colon polyps --she had a 3-year surveillance colonoscopy in 2019 after having prior multiple adenomatous colon polyps in 2016.  Her 2019 exam was normal.  5-year interval for recall was recommended based on her history of polyps -- Given recent issues with scleroderma we will defer surveillance colonoscopy for now -- We discussed colonoscopy based on overall disease control scleroderma at follow-up  3.  Scleroderma with systemic sclerosis/question of cardiac involvement --she will follow-up with her rheumatology team as well as cardiology  40 minutes total spent today including patient facing time, coordination of care, reviewing medical history/procedures/pertinent radiology studies, and documentation  of the encounter.

## 2023-02-06 NOTE — Patient Instructions (Signed)
Stay on pantoprazole 40 mg twice daily before meals.   Please follow up with Dr. Rhea Belton in December. We do not have a schedule out at this time. We will contact you when the schedule becomes available.   _______________________________________________________  If your blood pressure at your visit was 140/90 or greater, please contact your primary care physician to follow up on this.  _______________________________________________________  If you are age 20 or older, your body mass index should be between 23-30. Your Body mass index is 22.66 kg/m. If this is out of the aforementioned range listed, please consider follow up with your Primary Care Provider.  If you are age 76 or younger, your body mass index should be between 19-25. Your Body mass index is 22.66 kg/m. If this is out of the aformentioned range listed, please consider follow up with your Primary Care Provider.   ________________________________________________________  The [facility] providers would like to encourage you to use [facility] to communicate with providers for non-urgent requests or questions.  Due to long hold times on the telephone, sending your provider a message by [facility] may be a faster and more efficient way to get a response.  Please allow 48 business hours for a response.  Please remember that this is for non-urgent requests.  _______________________________________________________

## 2023-02-11 ENCOUNTER — Inpatient Hospital Stay: Payer: Medicare Other

## 2023-02-12 ENCOUNTER — Inpatient Hospital Stay: Payer: Medicare Other

## 2023-02-13 ENCOUNTER — Inpatient Hospital Stay: Payer: Medicare Other

## 2023-02-13 ENCOUNTER — Telehealth (HOSPITAL_COMMUNITY): Payer: Self-pay | Admitting: *Deleted

## 2023-02-13 NOTE — Telephone Encounter (Signed)
Reaching out to patient to offer assistance regarding upcoming cardiac imaging study; pt verbalizes understanding of appt date/time, parking situation and where to check in; name and call back number provided for further questions should they arise  Merle Prescott RN Navigator Cardiac Imaging [facility] [phone] office [phone] cell  Patient denies metal or claustrophobia. 

## 2023-02-14 ENCOUNTER — Ambulatory Visit (HOSPITAL_COMMUNITY)
Admission: RE | Admit: 2023-02-14 | Discharge: 2023-02-14 | Disposition: A | Discharge status: Home / Self Care | Payer: Medicare Other | Source: Ambulatory Visit | Attending: Hematology & Oncology | Admitting: Hematology & Oncology

## 2023-02-14 ENCOUNTER — Inpatient Hospital Stay: Payer: Medicare Other

## 2023-02-14 ENCOUNTER — Other Ambulatory Visit: Payer: Self-pay | Admitting: Hematology & Oncology

## 2023-02-14 ENCOUNTER — Other Ambulatory Visit: Payer: Self-pay | Admitting: *Deleted

## 2023-02-14 DIAGNOSIS — I3139 Other pericardial effusion (noninflammatory): Secondary | ICD-10-CM

## 2023-02-14 DIAGNOSIS — C50412 Malignant neoplasm of upper-outer quadrant of left female breast: Secondary | ICD-10-CM

## 2023-02-14 MED ORDER — HEPARIN SOD (PORK) LOCK FLUSH 100 UNIT/ML IV SOLN
500.0000 [IU] | INTRAVENOUS | Status: AC | PRN
  Administered 2023-02-14: 500 [IU]
  Filled 2023-02-14: qty 5

## 2023-02-14 MED ORDER — GADOBUTROL 1 MMOL/ML IV SOLN
9.0000 mL | Freq: Once | INTRAVENOUS | Status: AC | PRN
  Administered 2023-02-14: 9 mL via INTRAVENOUS

## 2023-02-17 ENCOUNTER — Encounter: Payer: Self-pay | Admitting: Family Medicine

## 2023-02-17 ENCOUNTER — Inpatient Hospital Stay: Payer: Medicare Other | Admitting: Hematology & Oncology

## 2023-02-17 ENCOUNTER — Inpatient Hospital Stay: Payer: Medicare Other

## 2023-02-17 ENCOUNTER — Other Ambulatory Visit: Payer: Self-pay

## 2023-02-17 ENCOUNTER — Inpatient Hospital Stay: Payer: Medicare Other | Attending: Hematology & Oncology

## 2023-02-17 VITALS — BP 106/53 | HR 85 | Temp 97.7°F | Resp 19 | Ht 64.0 in | Wt 130.0 lb

## 2023-02-17 VITALS — BP 102/83 | HR 85 | Resp 20

## 2023-02-17 DIAGNOSIS — D649 Anemia, unspecified: Secondary | ICD-10-CM | POA: Diagnosis not present

## 2023-02-17 DIAGNOSIS — Z79899 Other long term (current) drug therapy: Secondary | ICD-10-CM | POA: Diagnosis not present

## 2023-02-17 DIAGNOSIS — N1831 Chronic kidney disease, stage 3a: Secondary | ICD-10-CM

## 2023-02-17 DIAGNOSIS — M349 Systemic sclerosis, unspecified: Secondary | ICD-10-CM

## 2023-02-17 DIAGNOSIS — I3139 Other pericardial effusion (noninflammatory): Secondary | ICD-10-CM | POA: Diagnosis not present

## 2023-02-17 DIAGNOSIS — C50412 Malignant neoplasm of upper-outer quadrant of left female breast: Secondary | ICD-10-CM

## 2023-02-17 DIAGNOSIS — Z170 Estrogen receptor positive status [ER+]: Secondary | ICD-10-CM | POA: Diagnosis not present

## 2023-02-17 DIAGNOSIS — R799 Abnormal finding of blood chemistry, unspecified: Secondary | ICD-10-CM

## 2023-02-17 DIAGNOSIS — C50611 Malignant neoplasm of axillary tail of right female breast: Secondary | ICD-10-CM

## 2023-02-17 DIAGNOSIS — Z79890 Hormone replacement therapy (postmenopausal): Secondary | ICD-10-CM | POA: Diagnosis not present

## 2023-02-17 DIAGNOSIS — Z853 Personal history of malignant neoplasm of breast: Secondary | ICD-10-CM | POA: Insufficient documentation

## 2023-02-17 DIAGNOSIS — Z923 Personal history of irradiation: Secondary | ICD-10-CM | POA: Insufficient documentation

## 2023-02-17 DIAGNOSIS — D509 Iron deficiency anemia, unspecified: Secondary | ICD-10-CM

## 2023-02-17 DIAGNOSIS — Z79624 Long term (current) use of inhibitors of nucleotide synthesis: Secondary | ICD-10-CM | POA: Insufficient documentation

## 2023-02-17 LAB — CBC WITH DIFFERENTIAL (CANCER CENTER ONLY)
Abs Immature Granulocytes: 0.12 10*3/uL — ABNORMAL HIGH (ref 0.00–0.07)
Basophils Absolute: 0.1 10*3/uL (ref 0.0–0.1)
Basophils Relative: 1 %
Eosinophils Absolute: 0.2 10*3/uL (ref 0.0–0.5)
Eosinophils Relative: 2 %
HCT: 35.9 % — ABNORMAL LOW (ref 36.0–46.0)
Hemoglobin: 11 g/dL — ABNORMAL LOW (ref 12.0–15.0)
Immature Granulocytes: 2 %
Lymphocytes Relative: 9 %
Lymphs Abs: 0.6 10*3/uL — ABNORMAL LOW (ref 0.7–4.0)
MCH: 28.9 pg (ref 26.0–34.0)
MCHC: 30.6 g/dL (ref 30.0–36.0)
MCV: 94.5 fL (ref 80.0–100.0)
Monocytes Absolute: 0.6 10*3/uL (ref 0.1–1.0)
Monocytes Relative: 8 %
Neutro Abs: 5.5 10*3/uL (ref 1.7–7.7)
Neutrophils Relative %: 78 %
Platelet Count: 244 10*3/uL (ref 150–400)
RBC: 3.8 MIL/uL — ABNORMAL LOW (ref 3.87–5.11)
RDW: 13.9 % (ref 11.5–15.5)
WBC Count: 7 10*3/uL (ref 4.0–10.5)
nRBC: 0 % (ref 0.0–0.2)

## 2023-02-17 LAB — CMP (CANCER CENTER ONLY)
ALT: 20 U/L (ref 0–44)
AST: 41 U/L (ref 15–41)
Albumin: 3.7 g/dL (ref 3.5–5.0)
Alkaline Phosphatase: 53 U/L (ref 38–126)
Anion gap: 6 (ref 5–15)
BUN: 18 mg/dL (ref 8–23)
CO2: 27 mmol/L (ref 22–32)
Calcium: 9 mg/dL (ref 8.9–10.3)
Chloride: 103 mmol/L (ref 98–111)
Creatinine: 1.06 mg/dL — ABNORMAL HIGH (ref 0.44–1.00)
GFR, Estimated: 55 mL/min — ABNORMAL LOW (ref >60)
Glucose, Bld: 104 mg/dL — ABNORMAL HIGH (ref 70–99)
Potassium: 3.8 mmol/L (ref 3.5–5.1)
Sodium: 136 mmol/L (ref 135–145)
Total Bilirubin: 0.3 mg/dL (ref 0.3–1.2)
Total Protein: 7.1 g/dL (ref 6.5–8.1)

## 2023-02-17 LAB — FERRITIN: Ferritin: 81 ng/mL (ref 11–307)

## 2023-02-17 LAB — IRON AND IRON BINDING CAPACITY (CC-WL,HP ONLY)
Iron: 68 ug/dL (ref 28–170)
Saturation Ratios: 24 % (ref 10.4–31.8)
TIBC: 280 ug/dL (ref 250–450)
UIBC: 212 ug/dL (ref 148–442)

## 2023-02-17 LAB — RETICULOCYTES
Immature Retic Fract: 4.4 % (ref 2.3–15.9)
RBC.: 3.71 MIL/uL — ABNORMAL LOW (ref 3.87–5.11)
Retic Count, Absolute: 41.6 10*3/uL (ref 19.0–186.0)
Retic Ct Pct: 1.1 % (ref 0.4–3.1)

## 2023-02-17 MED ORDER — DIPHENHYDRAMINE HCL 25 MG PO CAPS
25.0000 mg | ORAL_CAPSULE | Freq: Once | ORAL | Status: DC
  Filled 2023-02-17: qty 1

## 2023-02-17 MED ORDER — ACETAMINOPHEN 325 MG PO TABS
650.0000 mg | ORAL_TABLET | Freq: Once | ORAL | Status: DC
  Filled 2023-02-17: qty 2

## 2023-02-17 MED ORDER — HEPARIN SOD (PORK) LOCK FLUSH 100 UNIT/ML IV SOLN
500.0000 [IU] | Freq: Once | INTRAVENOUS | Status: AC
  Administered 2023-02-17: 500 [IU] via INTRAVENOUS

## 2023-02-17 MED ORDER — SODIUM CHLORIDE 0.9% FLUSH
10.0000 mL | INTRAVENOUS | Status: DC | PRN
  Administered 2023-02-17: 10 mL via INTRAVENOUS

## 2023-02-17 MED ORDER — DEXTROSE 5 % IV SOLN: INTRAVENOUS | Status: DC

## 2023-02-17 MED ORDER — IMMUNE GLOBULIN (HUMAN) 20 GM/200ML IV SOLN
25.0000 g | Freq: Once | INTRAVENOUS | Status: AC
  Administered 2023-02-17: 25 g via INTRAVENOUS
  Filled 2023-02-17: qty 200
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
## 2023-02-17 NOTE — Patient Instructions (Signed)

## 2023-02-18 ENCOUNTER — Inpatient Hospital Stay: Payer: Medicare Other

## 2023-02-18 VITALS — BP 122/62 | HR 82 | Temp 98.9°F | Resp 18

## 2023-02-18 DIAGNOSIS — Z79899 Other long term (current) drug therapy: Secondary | ICD-10-CM | POA: Diagnosis not present

## 2023-02-18 DIAGNOSIS — M349 Systemic sclerosis, unspecified: Secondary | ICD-10-CM | POA: Diagnosis not present

## 2023-02-18 DIAGNOSIS — D509 Iron deficiency anemia, unspecified: Secondary | ICD-10-CM

## 2023-02-18 DIAGNOSIS — Z79624 Long term (current) use of inhibitors of nucleotide synthesis: Secondary | ICD-10-CM | POA: Diagnosis not present

## 2023-02-18 DIAGNOSIS — I3139 Other pericardial effusion (noninflammatory): Secondary | ICD-10-CM | POA: Diagnosis not present

## 2023-02-18 DIAGNOSIS — Z853 Personal history of malignant neoplasm of breast: Secondary | ICD-10-CM | POA: Diagnosis not present

## 2023-02-18 DIAGNOSIS — D649 Anemia, unspecified: Secondary | ICD-10-CM | POA: Diagnosis not present

## 2023-02-18 MED ORDER — DEXTROSE 5 % IV SOLN: INTRAVENOUS | Status: DC

## 2023-02-18 MED ORDER — IMMUNE GLOBULIN (HUMAN) 20 GM/200ML IV SOLN
25.0000 g | Freq: Once | INTRAVENOUS | Status: AC
  Administered 2023-02-18: 25 g via INTRAVENOUS
  Filled 2023-02-18: qty 250

## 2023-02-18 MED ORDER — DARBEPOETIN ALFA 300 MCG/0.6ML IJ SOSY: 300.0000 ug | PREFILLED_SYRINGE | Freq: Once | INTRAMUSCULAR | Status: DC

## 2023-02-18 MED ORDER — ACETAMINOPHEN 325 MG PO TABS: 650.0000 mg | ORAL_TABLET | Freq: Once | ORAL | Status: DC

## 2023-02-18 MED ORDER — DIPHENHYDRAMINE HCL 25 MG PO CAPS: 25.0000 mg | ORAL_CAPSULE | Freq: Once | ORAL | Status: DC

## 2023-02-18 NOTE — Patient Instructions (Signed)

## 2023-02-19 ENCOUNTER — Inpatient Hospital Stay: Payer: Medicare Other

## 2023-02-19 VITALS — BP 117/68 | HR 79 | Temp 97.6°F | Resp 17

## 2023-02-19 DIAGNOSIS — Z853 Personal history of malignant neoplasm of breast: Secondary | ICD-10-CM | POA: Diagnosis not present

## 2023-02-19 DIAGNOSIS — Z79624 Long term (current) use of inhibitors of nucleotide synthesis: Secondary | ICD-10-CM | POA: Diagnosis not present

## 2023-02-19 DIAGNOSIS — I3139 Other pericardial effusion (noninflammatory): Secondary | ICD-10-CM | POA: Diagnosis not present

## 2023-02-19 DIAGNOSIS — D509 Iron deficiency anemia, unspecified: Secondary | ICD-10-CM

## 2023-02-19 DIAGNOSIS — Z79899 Other long term (current) drug therapy: Secondary | ICD-10-CM | POA: Diagnosis not present

## 2023-02-19 DIAGNOSIS — M349 Systemic sclerosis, unspecified: Secondary | ICD-10-CM | POA: Diagnosis not present

## 2023-02-19 DIAGNOSIS — D649 Anemia, unspecified: Secondary | ICD-10-CM | POA: Diagnosis not present

## 2023-02-19 MED ORDER — DEXTROSE 5 % IV SOLN: INTRAVENOUS | Status: DC

## 2023-02-19 MED ORDER — IMMUNE GLOBULIN (HUMAN) 20 GM/200ML IV SOLN
25.0000 g | Freq: Once | INTRAVENOUS | Status: AC
  Administered 2023-02-19: 25 g via INTRAVENOUS
  Filled 2023-02-19: qty 250

## 2023-02-19 NOTE — Patient Instructions (Signed)

## 2023-02-20 ENCOUNTER — Inpatient Hospital Stay: Payer: Medicare Other

## 2023-02-20 VITALS — BP 140/70 | HR 84 | Temp 97.4°F | Resp 16

## 2023-02-20 DIAGNOSIS — Z79899 Other long term (current) drug therapy: Secondary | ICD-10-CM | POA: Diagnosis not present

## 2023-02-20 DIAGNOSIS — Z853 Personal history of malignant neoplasm of breast: Secondary | ICD-10-CM | POA: Diagnosis not present

## 2023-02-20 DIAGNOSIS — D649 Anemia, unspecified: Secondary | ICD-10-CM | POA: Diagnosis not present

## 2023-02-20 DIAGNOSIS — D509 Iron deficiency anemia, unspecified: Secondary | ICD-10-CM

## 2023-02-20 DIAGNOSIS — Z79624 Long term (current) use of inhibitors of nucleotide synthesis: Secondary | ICD-10-CM | POA: Diagnosis not present

## 2023-02-20 DIAGNOSIS — M349 Systemic sclerosis, unspecified: Secondary | ICD-10-CM | POA: Diagnosis not present

## 2023-02-20 DIAGNOSIS — I3139 Other pericardial effusion (noninflammatory): Secondary | ICD-10-CM | POA: Diagnosis not present

## 2023-02-20 MED ORDER — IMMUNE GLOBULIN (HUMAN) 20 GM/200ML IV SOLN
25.0000 g | Freq: Once | INTRAVENOUS | Status: AC
  Administered 2023-02-20: 25 g via INTRAVENOUS
  Filled 2023-02-20: qty 250

## 2023-02-20 MED ORDER — DEXTROSE 5 % IV SOLN: INTRAVENOUS | Status: DC

## 2023-02-20 NOTE — Patient Instructions (Signed)

## 2023-02-21 ENCOUNTER — Inpatient Hospital Stay: Payer: Medicare Other

## 2023-02-21 VITALS — BP 128/69 | HR 80 | Temp 98.1°F | Resp 16

## 2023-02-21 DIAGNOSIS — Z79899 Other long term (current) drug therapy: Secondary | ICD-10-CM

## 2023-02-21 DIAGNOSIS — Z79624 Long term (current) use of inhibitors of nucleotide synthesis: Secondary | ICD-10-CM | POA: Diagnosis not present

## 2023-02-21 DIAGNOSIS — M349 Systemic sclerosis, unspecified: Secondary | ICD-10-CM | POA: Diagnosis not present

## 2023-02-21 DIAGNOSIS — Z853 Personal history of malignant neoplasm of breast: Secondary | ICD-10-CM | POA: Diagnosis not present

## 2023-02-21 DIAGNOSIS — D509 Iron deficiency anemia, unspecified: Secondary | ICD-10-CM

## 2023-02-21 DIAGNOSIS — I3139 Other pericardial effusion (noninflammatory): Secondary | ICD-10-CM | POA: Diagnosis not present

## 2023-02-21 DIAGNOSIS — D649 Anemia, unspecified: Secondary | ICD-10-CM | POA: Diagnosis not present

## 2023-02-21 MED ORDER — SODIUM CHLORIDE 0.9% FLUSH
10.0000 mL | INTRAVENOUS | Status: DC | PRN
  Administered 2023-02-21: 10 mL via INTRAVENOUS

## 2023-02-21 MED ORDER — ACETAMINOPHEN 325 MG PO TABS: 650.0000 mg | ORAL_TABLET | Freq: Once | ORAL | Status: DC

## 2023-02-21 MED ORDER — IMMUNE GLOBULIN (HUMAN) 20 GM/200ML IV SOLN
25.0000 g | Freq: Once | INTRAVENOUS | Status: AC
  Administered 2023-02-21: 25 g via INTRAVENOUS
  Filled 2023-02-21: qty 200

## 2023-02-21 MED ORDER — HEPARIN SOD (PORK) LOCK FLUSH 100 UNIT/ML IV SOLN
500.0000 [IU] | Freq: Once | INTRAVENOUS | Status: AC
  Administered 2023-02-21: 500 [IU] via INTRAVENOUS

## 2023-02-21 MED ORDER — DEXTROSE 5 % IV SOLN: INTRAVENOUS | Status: DC

## 2023-02-21 MED ORDER — DIPHENHYDRAMINE HCL 25 MG PO CAPS: 25.0000 mg | ORAL_CAPSULE | Freq: Once | ORAL | Status: DC

## 2023-02-21 NOTE — Patient Instructions (Addendum)

## 2023-02-25 DIAGNOSIS — M25642 Stiffness of left hand, not elsewhere classified: Secondary | ICD-10-CM | POA: Diagnosis not present

## 2023-02-25 DIAGNOSIS — M2560 Stiffness of unspecified joint, not elsewhere classified: Secondary | ICD-10-CM | POA: Diagnosis not present

## 2023-02-25 DIAGNOSIS — M79641 Pain in right hand: Secondary | ICD-10-CM | POA: Diagnosis not present

## 2023-02-25 DIAGNOSIS — R531 Weakness: Secondary | ICD-10-CM | POA: Diagnosis not present

## 2023-02-25 DIAGNOSIS — R600 Localized edema: Secondary | ICD-10-CM | POA: Diagnosis not present

## 2023-02-25 DIAGNOSIS — M25641 Stiffness of right hand, not elsewhere classified: Secondary | ICD-10-CM | POA: Diagnosis not present

## 2023-02-25 DIAGNOSIS — M79644 Pain in right finger(s): Secondary | ICD-10-CM | POA: Diagnosis not present

## 2023-02-25 DIAGNOSIS — M349 Systemic sclerosis, unspecified: Secondary | ICD-10-CM | POA: Diagnosis not present

## 2023-02-25 DIAGNOSIS — M3489 Other systemic sclerosis: Secondary | ICD-10-CM | POA: Diagnosis not present

## 2023-02-25 DIAGNOSIS — M79642 Pain in left hand: Secondary | ICD-10-CM | POA: Diagnosis not present

## 2023-02-25 DIAGNOSIS — M79645 Pain in left finger(s): Secondary | ICD-10-CM | POA: Diagnosis not present

## 2023-02-27 DIAGNOSIS — R2681 Unsteadiness on feet: Secondary | ICD-10-CM | POA: Diagnosis not present

## 2023-02-27 DIAGNOSIS — M62838 Other muscle spasm: Secondary | ICD-10-CM | POA: Diagnosis not present

## 2023-02-27 DIAGNOSIS — M6281 Muscle weakness (generalized): Secondary | ICD-10-CM | POA: Diagnosis not present

## 2023-02-27 DIAGNOSIS — M25551 Pain in right hip: Secondary | ICD-10-CM | POA: Diagnosis not present

## 2023-02-28 DIAGNOSIS — N1831 Chronic kidney disease, stage 3a: Secondary | ICD-10-CM | POA: Diagnosis not present

## 2023-02-28 DIAGNOSIS — I129 Hypertensive chronic kidney disease with stage 1 through stage 4 chronic kidney disease, or unspecified chronic kidney disease: Secondary | ICD-10-CM | POA: Diagnosis not present

## 2023-02-28 DIAGNOSIS — M349 Systemic sclerosis, unspecified: Secondary | ICD-10-CM | POA: Diagnosis not present

## 2023-02-28 DIAGNOSIS — E876 Hypokalemia: Secondary | ICD-10-CM | POA: Diagnosis not present

## 2023-02-28 DIAGNOSIS — N280 Ischemia and infarction of kidney: Secondary | ICD-10-CM | POA: Diagnosis not present

## 2023-02-28 DIAGNOSIS — D631 Anemia in chronic kidney disease: Secondary | ICD-10-CM | POA: Diagnosis not present

## 2023-03-01 LAB — LAB REPORT - SCANNED
Albumin, Urine POC: 7.7
Creatinine, POC: 99.3 mg/dL
Microalb Creat Ratio: 8

## 2023-03-10 ENCOUNTER — Encounter: Payer: Self-pay | Admitting: Hematology & Oncology

## 2023-03-10 ENCOUNTER — Other Ambulatory Visit: Payer: Self-pay

## 2023-03-10 DIAGNOSIS — C50611 Malignant neoplasm of axillary tail of right female breast: Secondary | ICD-10-CM

## 2023-03-13 DIAGNOSIS — M62838 Other muscle spasm: Secondary | ICD-10-CM | POA: Diagnosis not present

## 2023-03-13 DIAGNOSIS — M6281 Muscle weakness (generalized): Secondary | ICD-10-CM | POA: Diagnosis not present

## 2023-03-13 DIAGNOSIS — M25551 Pain in right hip: Secondary | ICD-10-CM | POA: Diagnosis not present

## 2023-03-13 DIAGNOSIS — R2681 Unsteadiness on feet: Secondary | ICD-10-CM | POA: Diagnosis not present

## 2023-03-14 ENCOUNTER — Inpatient Hospital Stay: Payer: Medicare Other

## 2023-03-14 ENCOUNTER — Encounter: Payer: Self-pay | Admitting: Hematology & Oncology

## 2023-03-14 ENCOUNTER — Inpatient Hospital Stay (HOSPITAL_BASED_OUTPATIENT_CLINIC_OR_DEPARTMENT_OTHER): Payer: Medicare Other | Admitting: Hematology & Oncology

## 2023-03-14 ENCOUNTER — Encounter: Payer: Self-pay | Admitting: Family Medicine

## 2023-03-14 ENCOUNTER — Other Ambulatory Visit: Payer: Self-pay

## 2023-03-14 ENCOUNTER — Telehealth: Payer: Self-pay | Admitting: *Deleted

## 2023-03-14 VITALS — BP 107/59 | HR 79 | Temp 97.4°F | Resp 16 | Wt 130.0 lb

## 2023-03-14 VITALS — BP 112/67 | HR 82 | Resp 17

## 2023-03-14 DIAGNOSIS — C50412 Malignant neoplasm of upper-outer quadrant of left female breast: Secondary | ICD-10-CM

## 2023-03-14 DIAGNOSIS — Z79899 Other long term (current) drug therapy: Secondary | ICD-10-CM | POA: Diagnosis not present

## 2023-03-14 DIAGNOSIS — M349 Systemic sclerosis, unspecified: Secondary | ICD-10-CM

## 2023-03-14 DIAGNOSIS — Z170 Estrogen receptor positive status [ER+]: Secondary | ICD-10-CM

## 2023-03-14 DIAGNOSIS — Z79624 Long term (current) use of inhibitors of nucleotide synthesis: Secondary | ICD-10-CM | POA: Diagnosis not present

## 2023-03-14 DIAGNOSIS — D649 Anemia, unspecified: Secondary | ICD-10-CM | POA: Diagnosis not present

## 2023-03-14 DIAGNOSIS — N1831 Chronic kidney disease, stage 3a: Secondary | ICD-10-CM

## 2023-03-14 DIAGNOSIS — D509 Iron deficiency anemia, unspecified: Secondary | ICD-10-CM

## 2023-03-14 DIAGNOSIS — Z853 Personal history of malignant neoplasm of breast: Secondary | ICD-10-CM | POA: Diagnosis not present

## 2023-03-14 DIAGNOSIS — I3139 Other pericardial effusion (noninflammatory): Secondary | ICD-10-CM | POA: Diagnosis not present

## 2023-03-14 LAB — CMP (CANCER CENTER ONLY)
ALT: 24 U/L (ref 0–44)
AST: 45 U/L — ABNORMAL HIGH (ref 15–41)
Albumin: 3.4 g/dL — ABNORMAL LOW (ref 3.5–5.0)
Alkaline Phosphatase: 60 U/L (ref 38–126)
Anion gap: 8 (ref 5–15)
BUN: 18 mg/dL (ref 8–23)
CO2: 24 mmol/L (ref 22–32)
Calcium: 8.8 mg/dL — ABNORMAL LOW (ref 8.9–10.3)
Chloride: 102 mmol/L (ref 98–111)
Creatinine: 1.2 mg/dL — ABNORMAL HIGH (ref 0.44–1.00)
GFR, Estimated: 48 mL/min — ABNORMAL LOW (ref >60)
Glucose, Bld: 98 mg/dL (ref 70–99)
Potassium: 3.3 mmol/L — ABNORMAL LOW (ref 3.5–5.1)
Sodium: 134 mmol/L — ABNORMAL LOW (ref 135–145)
Total Bilirubin: 0.4 mg/dL (ref 0.3–1.2)
Total Protein: 6.9 g/dL (ref 6.5–8.1)

## 2023-03-14 LAB — CBC WITH DIFFERENTIAL (CANCER CENTER ONLY)
Abs Immature Granulocytes: 0.02 10*3/uL (ref 0.00–0.07)
Basophils Absolute: 0 10*3/uL (ref 0.0–0.1)
Basophils Relative: 1 %
Eosinophils Absolute: 0.1 10*3/uL (ref 0.0–0.5)
Eosinophils Relative: 2 %
HCT: 33.8 % — ABNORMAL LOW (ref 36.0–46.0)
Hemoglobin: 10.6 g/dL — ABNORMAL LOW (ref 12.0–15.0)
Immature Granulocytes: 0 %
Lymphocytes Relative: 9 %
Lymphs Abs: 0.6 10*3/uL — ABNORMAL LOW (ref 0.7–4.0)
MCH: 29.3 pg (ref 26.0–34.0)
MCHC: 31.4 g/dL (ref 30.0–36.0)
MCV: 93.4 fL (ref 80.0–100.0)
Monocytes Absolute: 0.6 10*3/uL (ref 0.1–1.0)
Monocytes Relative: 9 %
Neutro Abs: 4.9 10*3/uL (ref 1.7–7.7)
Neutrophils Relative %: 79 %
Platelet Count: 221 10*3/uL (ref 150–400)
RBC: 3.62 MIL/uL — ABNORMAL LOW (ref 3.87–5.11)
RDW: 13.5 % (ref 11.5–15.5)
WBC Count: 6.2 10*3/uL (ref 4.0–10.5)
nRBC: 0 % (ref 0.0–0.2)

## 2023-03-14 LAB — TROPONIN I (HIGH SENSITIVITY): Troponin I (High Sensitivity): 18 ng/L — ABNORMAL HIGH (ref <18)

## 2023-03-14 LAB — LACTATE DEHYDROGENASE: LDH: 221 U/L — ABNORMAL HIGH (ref 98–192)

## 2023-03-14 LAB — CK: Total CK: 359 U/L — ABNORMAL HIGH (ref 38–234)

## 2023-03-14 MED ORDER — HEPARIN SOD (PORK) LOCK FLUSH 100 UNIT/ML IV SOLN
500.0000 [IU] | Freq: Once | INTRAVENOUS | Status: AC
  Administered 2023-03-14: 500 [IU] via INTRAVENOUS

## 2023-03-14 MED ORDER — DIPHENHYDRAMINE HCL 25 MG PO CAPS: 25.0000 mg | ORAL_CAPSULE | Freq: Once | ORAL | Status: DC

## 2023-03-14 MED ORDER — DEXTROSE 5 % IV SOLN: INTRAVENOUS | Status: DC

## 2023-03-14 MED ORDER — ACETAMINOPHEN 325 MG PO TABS: 650.0000 mg | ORAL_TABLET | Freq: Once | ORAL | Status: DC

## 2023-03-14 MED ORDER — SODIUM CHLORIDE 0.9% FLUSH
10.0000 mL | Freq: Once | INTRAVENOUS | Status: AC
  Administered 2023-03-14: 10 mL via INTRAVENOUS

## 2023-03-14 MED ORDER — DARBEPOETIN ALFA 300 MCG/0.6ML IJ SOSY
300.0000 ug | PREFILLED_SYRINGE | Freq: Once | INTRAMUSCULAR | Status: AC
  Administered 2023-03-14: 300 ug via SUBCUTANEOUS
  Filled 2023-03-14: qty 0.6

## 2023-03-14 MED ORDER — IMMUNE GLOBULIN (HUMAN) 20 GM/200ML IV SOLN
25.0000 g | Freq: Once | INTRAVENOUS | Status: AC
  Administered 2023-03-14: 25 g via INTRAVENOUS
  Filled 2023-03-14: qty 250

## 2023-03-14 NOTE — Patient Instructions (Signed)

## 2023-03-14 NOTE — Patient Instructions (Signed)

## 2023-03-14 NOTE — Telephone Encounter (Signed)
-----   Message from Josph Macho sent at 03/14/2023 12:04 PM EDT ----- Please make sure that her Rheumatologist up in [facility] gets these labs.  Please let her know that the CK is much better now.  It went from 583 down to 359.  Thanks.  Cindee Lame

## 2023-03-14 NOTE — Progress Notes (Signed)
Hematology and Oncology Follow Up Visit  [name] [id number] Mar 08, 1951 72 y.o. 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  She she is doing quite well.  She is following a little bit better.  She is taking massage therapy for her swallowing.  I know this is quite intense.  She seems to think this might be helping her.  She tolerated the IVIG quite nicely.  She also gets Aranesp.  The Aranesp does seem to be helping her.  She will need to have some Aranesp today.  She has had no rashes.  She did have some blood work done as part of her evaluation for the scleroderma.  I think she has an appointment with her rheumatologist at [facility] in September.  She has had no problems with bleeding.  There is no change in bowel or bladder habits.  We have to remember that she does have history of localized breast cancer.  Her last CA 27.29 was 44.  Overall, I would say that her performance status is probably ECOG 1.       Medications:  Current Outpatient Medications:    acetaminophen (TYLENOL) 650 MG CR tablet, Take 650 mg by mouth daily as needed for pain., Disp: , Rfl:    diphenhydrAMINE (BENADRYL) 12.5 MG chewable tablet, Chew 12.5 mg by mouth 4 (four) times daily as needed for allergies., Disp: , Rfl:    ELDERBERRY PO, Take 1 oz by mouth 4 (four) times daily as needed. Liquid, Disp: , Rfl:    furosemide (LASIX) 20 MG tablet, Take 20 mg by mouth daily., Disp: , Rfl:    losartan (COZAAR) 25 MG tablet, Take 1 tablet (25 mg total) by mouth at bedtime., Disp: 90 tablet, Rfl: 3   Multiple Vitamin (MULTIVITAMIN) capsule, Take 1 capsule by mouth daily., Disp: , Rfl:     mycophenolate (CELLCEPT) 500 MG tablet, Take 1,500 mg by mouth 2 (two) times daily., Disp: , Rfl:    pantoprazole (PROTONIX) 40 MG tablet, Take 40 mg by mouth 2 (two) times daily., Disp: , Rfl:    sodium chloride (OCEAN) 0.65 % SOLN nasal spray, Place 1 spray into both nostrils as needed for congestion., Disp: , Rfl:    SYNTHROID 125 MCG tablet, Take 1 tablet (125 mcg total) by mouth daily before breakfast. *Must be the brand only*, Disp: 90 tablet, Rfl: 1  Allergies:  Allergies  Allergen Reactions   Betadine [Povidone Iodine] Rash   Contrast Media [Iodinated Contrast Media] Rash and Other (See Comments)    "per pt: recently had CT 08/ 2018 even through given pre-medication, IVP still caused rash"   Epinephrine Other (See Comments)    Severe headaches    Iodine Rash and Other (See Comments)   Oxycodone Anxiety   Prednisone Anaphylaxis    Per Nephrology pt should avoid this medication because she has Scleroderma and it will interfere with her Kidneys,    Gabapentin Other (See Comments)    Dizziness   Lidocaine Rash   Nickel Rash and Other (See Comments)    Rash and blisters   Other Rash and Other (See Comments)    Hospital gown   Penicillins Rash and Other (See  Comments)    Rash and blisters Has patient had a PCN reaction causing immediate rash, facial/tongue/throat swelling, SOB or lightheadedness with hypotension: Yes Has patient had a PCN reaction causing severe rash involving mucus membranes or skin necrosis: No Has patient had a PCN reaction that required hospitalization: No Has patient had a PCN reaction occurring within the last 10 years: No If all of the above answers are "NO", then may proceed with Cephalosporin use.    Sulfa Antibiotics Rash    Past Medical History, Surgical history, Social history, and Family History were reviewed and updated.  Review of Systems: Review of Systems  Constitutional:  Positive for fatigue.  HENT:  Negative.    Eyes: Negative.    Respiratory: Negative.    Cardiovascular: Negative.   Gastrointestinal: Negative.   Endocrine: Negative.   Genitourinary: Negative.    Musculoskeletal:  Positive for arthralgias and myalgias.  Skin:  Positive for rash.  Neurological: Negative.   Hematological: Negative.   Psychiatric/Behavioral: Negative.      Physical Exam: Temperature is 97.4.  Pulse 79.  Blood pressure 107/59.  Weight is 130 pounds.    Wt Readings from Last 3 Encounters:  03/14/23 130 lb (59 kg)  02/17/23 130 lb (59 kg)  02/06/23 132 lb (59.9 kg)    Physical Exam Vitals reviewed.  Constitutional:      Comments: Her breast exam shows right breast with no masses, edema or erythema.  There is no right axillary adenopathy.  Left breast shows the lumpectomy scar at about the 2 o'clock position.  There is little firmness at the lumpectomy site.  No distinct masses noted.  There is no left axillary adenopathy.  There is no left nipple discharge.  HENT:     Head: Normocephalic and atraumatic.  Eyes:     Pupils: Pupils are equal, round, and reactive to light.  Cardiovascular:     Rate and Rhythm: Normal rate and regular rhythm.     Heart sounds: Normal heart sounds.  Pulmonary:     Effort: Pulmonary effort is normal.     Breath sounds: Normal breath sounds.  Abdominal:     General: Bowel sounds are normal.     Palpations: Abdomen is soft.  Musculoskeletal:        General: No tenderness or deformity. Normal range of motion.     Cervical back: Normal range of motion.  Lymphadenopathy:     Cervical: No cervical adenopathy.  Skin:    General: Skin is warm and dry.     Findings: No erythema or rash.     Comments: Skin exam shows some changes of the scleroderma.  This is mostly on the face, around the lip area.  Her arms do show a little bit of skin thickening.  Neurological:     Mental Status: She is alert and oriented to person, place, and time.  Psychiatric:        Behavior: Behavior normal.        Thought  Content: Thought content normal.        Judgment: Judgment normal.     Lab Results  Component Value Date   WBC 6.2 03/14/2023   HGB 10.6 (L) 03/14/2023   HCT 33.8 (L) 03/14/2023   MCV 93.4 03/14/2023   PLT 221 03/14/2023     Chemistry      Component Value Date/Time   NA 136 02/17/2023 0930   NA 144 01/27/2020 1148   NA 141 06/09/2017 1117   K 3.8  02/17/2023 0930   K 3.9 06/09/2017 1117   CL 103 02/17/2023 0930   CL 109 (H) 12/14/2012 1253   CO2 27 02/17/2023 0930   CO2 23 06/09/2017 1117   BUN 18 02/17/2023 0930   BUN 11 01/27/2020 1148   BUN 11.5 06/09/2017 1117   CREATININE 1.06 (H) 02/17/2023 0930   CREATININE 1.2 (H) 06/09/2017 1117      Component Value Date/Time   CALCIUM 9.0 02/17/2023 0930   CALCIUM 8.9 06/09/2017 1117   ALKPHOS 53 02/17/2023 0930   ALKPHOS 40 06/09/2017 1117   AST 41 02/17/2023 0930   AST 35 (H) 06/09/2017 1117   ALT 20 02/17/2023 0930   ALT 19 06/09/2017 1117   BILITOT 0.3 02/17/2023 0930   BILITOT 0.37 06/09/2017 1117      Impression and Plan: Ms. Suto is a very charming 72 year old white female.  She has had a history of early stage breast cancer.  So far, we have not found any problems with respect to recurrent disease.  We always have to check her CA 27.29.  We try to help out with her scleroderma.  Clearly, it is scleroderma that will dictate her prognosis at her quality of life.    Thankfully, her creatinine kinase is improved to 359.  Her TROPONIN I is 18.  Her LDH is 221.  All of these are improved.  We will plan to get her back to see [place] in another month.   Josph Macho, MD 8/30/202410:08 AM

## 2023-03-15 LAB — IGG, IGA, IGM
IgA: 149 mg/dL (ref 64–422)
IgG (Immunoglobin G), Serum: 1656 mg/dL — ABNORMAL HIGH (ref 586–1602)
IgM (Immunoglobulin M), Srm: 56 mg/dL (ref 26–217)

## 2023-03-15 LAB — CANCER ANTIGEN 27.29: CA 27.29: 39.5 U/mL — ABNORMAL HIGH (ref 0.0–38.6)

## 2023-03-15 LAB — ALDOLASE: Aldolase: 8 U/L (ref 3.3–10.3)

## 2023-03-18 ENCOUNTER — Inpatient Hospital Stay: Payer: Medicare Other | Attending: Hematology & Oncology

## 2023-03-18 DIAGNOSIS — N1831 Chronic kidney disease, stage 3a: Secondary | ICD-10-CM | POA: Diagnosis not present

## 2023-03-18 DIAGNOSIS — Z79890 Hormone replacement therapy (postmenopausal): Secondary | ICD-10-CM | POA: Diagnosis not present

## 2023-03-18 DIAGNOSIS — I129 Hypertensive chronic kidney disease with stage 1 through stage 4 chronic kidney disease, or unspecified chronic kidney disease: Secondary | ICD-10-CM | POA: Insufficient documentation

## 2023-03-18 DIAGNOSIS — M349 Systemic sclerosis, unspecified: Secondary | ICD-10-CM | POA: Insufficient documentation

## 2023-03-18 DIAGNOSIS — N280 Ischemia and infarction of kidney: Secondary | ICD-10-CM

## 2023-03-18 DIAGNOSIS — Z79899 Other long term (current) drug therapy: Secondary | ICD-10-CM | POA: Diagnosis not present

## 2023-03-18 DIAGNOSIS — R978 Other abnormal tumor markers: Secondary | ICD-10-CM | POA: Insufficient documentation

## 2023-03-18 DIAGNOSIS — D509 Iron deficiency anemia, unspecified: Secondary | ICD-10-CM

## 2023-03-18 DIAGNOSIS — Z79624 Long term (current) use of inhibitors of nucleotide synthesis: Secondary | ICD-10-CM | POA: Diagnosis not present

## 2023-03-18 DIAGNOSIS — Z853 Personal history of malignant neoplasm of breast: Secondary | ICD-10-CM | POA: Diagnosis not present

## 2023-03-18 DIAGNOSIS — D631 Anemia in chronic kidney disease: Secondary | ICD-10-CM | POA: Diagnosis not present

## 2023-03-18 LAB — KAPPA/LAMBDA LIGHT CHAINS
Kappa free light chain: 17.8 mg/L (ref 3.3–19.4)
Kappa, lambda light chain ratio: 1.34 (ref 0.26–1.65)
Lambda free light chains: 13.3 mg/L (ref 5.7–26.3)

## 2023-03-18 MED ORDER — ACETAMINOPHEN 325 MG PO TABS: 650.0000 mg | ORAL_TABLET | Freq: Once | ORAL | Status: DC

## 2023-03-18 MED ORDER — IMMUNE GLOBULIN (HUMAN) 20 GM/200ML IV SOLN
25.0000 g | Freq: Once | INTRAVENOUS | Status: AC
  Administered 2023-03-18: 25 g via INTRAVENOUS
  Filled 2023-03-18: qty 50

## 2023-03-18 MED ORDER — DEXTROSE 5 % IV SOLN: INTRAVENOUS | Status: DC

## 2023-03-18 MED ORDER — DIPHENHYDRAMINE HCL 25 MG PO CAPS: 25.0000 mg | ORAL_CAPSULE | Freq: Once | ORAL | Status: DC

## 2023-03-18 NOTE — Patient Instructions (Signed)

## 2023-03-19 ENCOUNTER — Inpatient Hospital Stay: Payer: Medicare Other

## 2023-03-19 VITALS — BP 120/58 | HR 80 | Temp 97.9°F | Resp 18

## 2023-03-19 DIAGNOSIS — I129 Hypertensive chronic kidney disease with stage 1 through stage 4 chronic kidney disease, or unspecified chronic kidney disease: Secondary | ICD-10-CM | POA: Diagnosis not present

## 2023-03-19 DIAGNOSIS — D509 Iron deficiency anemia, unspecified: Secondary | ICD-10-CM

## 2023-03-19 DIAGNOSIS — M349 Systemic sclerosis, unspecified: Secondary | ICD-10-CM | POA: Diagnosis not present

## 2023-03-19 DIAGNOSIS — R978 Other abnormal tumor markers: Secondary | ICD-10-CM | POA: Diagnosis not present

## 2023-03-19 DIAGNOSIS — N1831 Chronic kidney disease, stage 3a: Secondary | ICD-10-CM | POA: Diagnosis not present

## 2023-03-19 DIAGNOSIS — Z79899 Other long term (current) drug therapy: Secondary | ICD-10-CM | POA: Diagnosis not present

## 2023-03-19 DIAGNOSIS — D631 Anemia in chronic kidney disease: Secondary | ICD-10-CM | POA: Diagnosis not present

## 2023-03-19 MED ORDER — DIPHENHYDRAMINE HCL 25 MG PO CAPS: 25.0000 mg | ORAL_CAPSULE | Freq: Once | ORAL | Status: DC

## 2023-03-19 MED ORDER — DEXTROSE 5 % IV SOLN: INTRAVENOUS | Status: DC

## 2023-03-19 MED ORDER — IMMUNE GLOBULIN (HUMAN) 20 GM/200ML IV SOLN
25.0000 g | Freq: Once | INTRAVENOUS | Status: AC
  Administered 2023-03-19: 25 g via INTRAVENOUS
  Filled 2023-03-19: qty 50

## 2023-03-19 MED ORDER — ACETAMINOPHEN 325 MG PO TABS: 650.0000 mg | ORAL_TABLET | Freq: Once | ORAL | Status: DC

## 2023-03-19 NOTE — Patient Instructions (Signed)

## 2023-03-20 ENCOUNTER — Inpatient Hospital Stay: Payer: Medicare Other

## 2023-03-20 VITALS — BP 109/62 | HR 81 | Temp 97.9°F | Resp 17

## 2023-03-20 DIAGNOSIS — M349 Systemic sclerosis, unspecified: Secondary | ICD-10-CM | POA: Diagnosis not present

## 2023-03-20 DIAGNOSIS — D509 Iron deficiency anemia, unspecified: Secondary | ICD-10-CM

## 2023-03-20 DIAGNOSIS — Z79899 Other long term (current) drug therapy: Secondary | ICD-10-CM | POA: Diagnosis not present

## 2023-03-20 DIAGNOSIS — R978 Other abnormal tumor markers: Secondary | ICD-10-CM | POA: Diagnosis not present

## 2023-03-20 DIAGNOSIS — D631 Anemia in chronic kidney disease: Secondary | ICD-10-CM | POA: Diagnosis not present

## 2023-03-20 DIAGNOSIS — I129 Hypertensive chronic kidney disease with stage 1 through stage 4 chronic kidney disease, or unspecified chronic kidney disease: Secondary | ICD-10-CM | POA: Diagnosis not present

## 2023-03-20 DIAGNOSIS — N1831 Chronic kidney disease, stage 3a: Secondary | ICD-10-CM | POA: Diagnosis not present

## 2023-03-20 MED ORDER — IMMUNE GLOBULIN (HUMAN) 20 GM/200ML IV SOLN
25.0000 g | Freq: Once | INTRAVENOUS | Status: AC
  Administered 2023-03-20: 25 g via INTRAVENOUS
  Filled 2023-03-20: qty 50

## 2023-03-20 MED ORDER — DIPHENHYDRAMINE HCL 25 MG PO CAPS: 25.0000 mg | ORAL_CAPSULE | Freq: Once | ORAL | Status: DC

## 2023-03-20 MED ORDER — ACETAMINOPHEN 325 MG PO TABS: 650.0000 mg | ORAL_TABLET | Freq: Once | ORAL | Status: DC

## 2023-03-20 MED ORDER — DEXTROSE 5 % IV SOLN: INTRAVENOUS | Status: DC

## 2023-03-20 MED ORDER — SODIUM CHLORIDE 0.9% FLUSH
10.0000 mL | Freq: Once | INTRAVENOUS | Status: AC
  Administered 2023-03-20: 10 mL via INTRAVENOUS

## 2023-03-20 MED ORDER — HEPARIN SOD (PORK) LOCK FLUSH 100 UNIT/ML IV SOLN
500.0000 [IU] | Freq: Once | INTRAVENOUS | Status: AC
  Administered 2023-03-20: 500 [IU] via INTRAVENOUS

## 2023-03-20 NOTE — Progress Notes (Signed)
Pt declined to stay for post infusion observation period. Pt stated she has tolerated medication multiple times prior without difficulty. Pt aware to call clinic with any questions or concerns. Pt verbalized understanding and had no further questions.  ? ?

## 2023-03-21 ENCOUNTER — Inpatient Hospital Stay: Payer: Medicare Other

## 2023-03-21 VITALS — BP 108/64 | HR 78 | Temp 97.5°F | Resp 18

## 2023-03-21 DIAGNOSIS — N1831 Chronic kidney disease, stage 3a: Secondary | ICD-10-CM | POA: Diagnosis not present

## 2023-03-21 DIAGNOSIS — D631 Anemia in chronic kidney disease: Secondary | ICD-10-CM | POA: Diagnosis not present

## 2023-03-21 DIAGNOSIS — D509 Iron deficiency anemia, unspecified: Secondary | ICD-10-CM

## 2023-03-21 DIAGNOSIS — Z170 Estrogen receptor positive status [ER+]: Secondary | ICD-10-CM

## 2023-03-21 DIAGNOSIS — M349 Systemic sclerosis, unspecified: Secondary | ICD-10-CM | POA: Diagnosis not present

## 2023-03-21 DIAGNOSIS — Z79899 Other long term (current) drug therapy: Secondary | ICD-10-CM | POA: Diagnosis not present

## 2023-03-21 DIAGNOSIS — I129 Hypertensive chronic kidney disease with stage 1 through stage 4 chronic kidney disease, or unspecified chronic kidney disease: Secondary | ICD-10-CM | POA: Diagnosis not present

## 2023-03-21 DIAGNOSIS — R978 Other abnormal tumor markers: Secondary | ICD-10-CM | POA: Diagnosis not present

## 2023-03-21 MED ORDER — DEXTROSE 5 % IV SOLN: INTRAVENOUS | Status: DC

## 2023-03-21 MED ORDER — SODIUM CHLORIDE 0.9% FLUSH
10.0000 mL | Freq: Once | INTRAVENOUS | Status: AC
  Administered 2023-03-21: 10 mL

## 2023-03-21 MED ORDER — DIPHENHYDRAMINE HCL 25 MG PO CAPS: 25.0000 mg | ORAL_CAPSULE | Freq: Once | ORAL | Status: DC

## 2023-03-21 MED ORDER — IMMUNE GLOBULIN (HUMAN) 20 GM/200ML IV SOLN
25.0000 g | Freq: Once | INTRAVENOUS | Status: AC
  Administered 2023-03-21: 25 g via INTRAVENOUS
  Filled 2023-03-21: qty 250

## 2023-03-21 MED ORDER — HEPARIN SOD (PORK) LOCK FLUSH 100 UNIT/ML IV SOLN
500.0000 [IU] | Freq: Once | INTRAVENOUS | Status: AC
  Administered 2023-03-21: 500 [IU] via INTRAVENOUS

## 2023-03-21 MED ORDER — ACETAMINOPHEN 325 MG PO TABS: 650.0000 mg | ORAL_TABLET | Freq: Once | ORAL | Status: DC

## 2023-03-21 MED ORDER — HEPARIN SOD (PORK) LOCK FLUSH 100 UNIT/ML IV SOLN: 500.0000 [IU] | Freq: Once | INTRAVENOUS | Status: DC

## 2023-03-21 NOTE — Patient Instructions (Addendum)
[facility]  Discharge Instructions: Thank you for choosing [facility] to provide your oncology and hematology care.   If you have a lab appointment with the Cancer Center, please go directly to the Cancer Center and check in at the registration area.  Wear comfortable clothing and clothing appropriate for easy access to any Portacath or PICC line.   We strive to give you quality time with your provider. You may need to reschedule your appointment if you arrive late (15 or more minutes).  Arriving late affects you and other patients whose appointments are after yours.  Also, if you miss three or more appointments without notifying the office, you may be dismissed from the clinic at the provider's discretion.      For prescription refill requests, have your pharmacy contact our office and allow 72 hours for refills to be completed.    Today you received the following chemotherapy and/or immunotherapy agents privigen      To help prevent nausea and vomiting after your treatment, we encourage you to take your nausea medication as directed.  BELOW ARE SYMPTOMS THAT SHOULD BE REPORTED IMMEDIATELY: *FEVER GREATER THAN 100.4 F (38 C) OR HIGHER *CHILLS OR SWEATING *NAUSEA AND VOMITING THAT IS NOT CONTROLLED WITH YOUR NAUSEA MEDICATION *UNUSUAL SHORTNESS OF BREATH *UNUSUAL BRUISING OR BLEEDING *URINARY PROBLEMS (pain or burning when urinating, or frequent urination) *BOWEL PROBLEMS (unusual diarrhea, constipation, pain near the anus) TENDERNESS IN MOUTH AND THROAT WITH OR WITHOUT PRESENCE OF ULCERS (sore throat, sores in mouth, or a toothache) UNUSUAL RASH, SWELLING OR PAIN  UNUSUAL VAGINAL DISCHARGE OR ITCHING   Items with * indicate a potential emergency and should be followed up as soon as possible or go to the Emergency Department if any problems should occur.  Please show the CHEMOTHERAPY ALERT CARD or IMMUNOTHERAPY ALERT CARD at check-in  to the Emergency Department and triage nurse. Should you have questions after your visit or need to cancel or reschedule your appointment, please contact [facility] [place]  [phone] and follow the prompts.  Office hours are 8:00 a.m. to 4:30 p.m. Monday - Friday. Please note that voicemails left after 4:00 p.m. may not be returned until the following business day.  We are closed weekends and major holidays. You have access to a nurse at all times for urgent questions. Please call the main number to the clinic [phone] and follow the prompts.  For any non-urgent questions, you may also contact your provider using MyChart. We now offer e-Visits for anyone 10 and older to request care online for non-urgent symptoms. For details visit [email or web address].   Also download the MyChart app! Go to the app store, search "MyChart", open the app, select , and log in with your MyChart username and password.  Immune Globulin Injection What is this medication? IMMUNE GLOBULIN (im MUNE  GLOB yoo lin) helps to prevent or reduce the severity of certain infections in patients who are at risk. This medicine is collected from the pooled blood of many donors. It is used to treat immune system problems, thrombocytopenia, and Kawasaki syndrome. This medicine may be used for other purposes; ask your health care provider or pharmacist if you have questions. This medicine may be used for other purposes; ask your health care provider or pharmacist if you have questions. COMMON BRAND NAME(S): ASCENIV, Baygam, BIVIGAM, Carimune, Carimune NF, cutaquig, Cuvitru, Flebogamma, Flebogamma DIF, GamaSTAN, GamaSTAN S/D, Gamimune N, Gammagard,  Gammagard S/D, Gammaked, Gammaplex, Gammar-P IV, Gamunex, Gamunex-C, Hizentra, Iveegam, Iveegam EN, Octagam, Panglobulin, Panglobulin NF, panzyga, Polygam S/D, Privigen, Sandoglobulin, Venoglobulin-S, Vigam, Vivaglobulin, Xembify What should I tell  my care team before I take this medication? They need to know if you have any of these conditions: diabetes extremely low or no immune antibodies in the blood heart disease history of blood clots hyperprolinemia infection in the blood, sepsis kidney disease recently received or scheduled to receive a vaccination an unusual or allergic reaction to human immune globulin, albumin, maltose, sucrose, other medicines, foods, dyes, or preservatives pregnant or trying to get pregnant breast-feeding How should I use this medication? This medicine is for injection into a muscle or infusion into a vein or skin. It is usually given by a health care professional in a hospital or clinic setting. In rare cases, some brands of this medicine might be given at home. You will be taught how to give this medicine. Use exactly as directed. Take your medicine at regular intervals. Do not take your medicine more often than directed. Talk to your pediatrician regarding the use of this medicine in children. While this drug may be prescribed for selected conditions, precautions do apply. Overdosage: If you think you have taken too much of this medicine contact a poison control center or emergency room at once. NOTE: This medicine is only for you. Do not share this medicine with others. Overdosage: If you think you have taken too much of this medicine contact a poison control center or emergency room at once. NOTE: This medicine is only for you. Do not share this medicine with others. What if I miss a dose? It is important not to miss your dose. Call your doctor or health care professional if you are unable to keep an appointment. If you give yourself the medicine and you miss a dose, take it as soon as you can. If it is almost time for your next dose, take only that dose. Do not take double or extra doses. What may interact with this medication? aspirin and aspirin-like medicines cisplatin cyclosporine medicines for  infection like acyclovir, adefovir, amphotericin B, bacitracin, cidofovir, foscarnet, ganciclovir, gentamicin, pentamidine, vancomycin NSAIDS, medicines for pain and inflammation, like ibuprofen or naproxen pamidronate vaccines zoledronic acid This list may not describe all possible interactions. Give your health care provider a list of all the medicines, herbs, non-prescription drugs, or dietary supplements you use. Also tell them if you smoke, drink alcohol, or use illegal drugs. Some items may interact with your medicine. This list may not describe all possible interactions. Give your health care provider a list of all the medicines, herbs, non-prescription drugs, or dietary supplements you use. Also tell them if you smoke, drink alcohol, or use illegal drugs. Some items may interact with your medicine. What should I watch for while using this medication? Your condition will be monitored carefully while you are receiving this medicine. This medicine is made from pooled blood donations of many different people. It may be possible to pass an infection in this medicine. However, the donors are screened for infections and all products are tested for HIV and hepatitis. The medicine is treated to kill most or all bacteria and viruses. Talk to your doctor about the risks and benefits of this medicine. Do not have vaccinations for at least 14 days before, or until at least 3 months after receiving this medicine. What side effects may I notice from receiving this medication? Side effects that you should report  to your doctor or health care professional as soon as possible: allergic reactions like skin rash, itching or hives, swelling of the face, lips, or tongue blue colored lips or skin breathing problems chest pain or tightness fever signs and symptoms of aseptic meningitis such as stiff neck; sensitivity to light; headache; drowsiness; fever; nausea; vomiting; rash signs and symptoms of a blood clot  such as chest pain; shortness of breath; pain, swelling, or warmth in the leg signs and symptoms of hemolytic anemia such as fast heartbeat; tiredness; dark yellow or brown urine; or yellowing of the eyes or skin signs and symptoms of kidney injury like trouble passing urine or change in the amount of urine sudden weight gain swelling of the ankles, feet, hands Side effects that usually do not require medical attention (report to your doctor or health care professional if they continue or are bothersome): diarrhea flushing headache increased sweating joint pain muscle cramps muscle pain nausea pain, redness, or irritation at site where injected tiredness This list may not describe all possible side effects. Call your doctor for medical advice about side effects. You may report side effects to FDA at 1-800-FDA-1088. This list may not describe all possible side effects. Call your doctor for medical advice about side effects. You may report side effects to FDA at 1-800-FDA-1088. Where should I keep my medication? Keep out of the reach of children. This drug is usually given in a hospital or clinic and will not be stored at home. In rare cases, some brands of this medicine may be given at home. If you are using this medicine at home, you will be instructed on how to store this medicine. Throw away any unused medicine after the expiration date on the label. NOTE: This sheet is a summary. It may not cover all possible information. If you have questions about this medicine, talk to your doctor, pharmacist, or health care provider.  2024 Elsevier/Gold Standard (2019-02-03 00:00:00)

## 2023-03-23 NOTE — Progress Notes (Signed)
ADVANCED HF CLINIC NOTE  Referring Physician: Dr. Marchelle Gearing Primary Care: Copland, Gwenlyn Found, MD Primary Cardiologist: None Rheumatology: Dr. Sherryll Burger at [facility] and Dr. Martyn Malay @ [facility]  HPI:  [name] is a 72 y/o woman with scleroderma with related ILD, previous breast CA treated with XRT 2013 referred for further evaluation of possible PAH  She was first diagnosed with scleroderma in 2017 based on a high titer ANA and skin thickening. She has been followed previously in the [facility] center and now followed by Dr .Marchelle Gearing. She was started on CellCept in 12/18 with significant improvement in FVC and diffusion capacity. Her CellCept was stopped in January 2023 due to prolonged episode of shingles.   Admitted to [facility] for scleroderma renal crisis in late January 24. BP 225/117. By the time of discharge, she had improvement but not normalization of her creatinine (peak Scr 2.8. down to 1.35). Blood pressure was better controlled. Mycophenolate was added back due to a worsening of her skin disease (now on 1000mg  BID). Since she was discharged from the hospital, she feels like she is slowly improving but continues to have increased edema.  She was seen in Pulmonary at [facility] (2/24) who reviewed data and agreed with continuing Cellcept in setting of worsening lung function   PFT 05/14/2022 [facility]  FEV1/FVC 77  FVC 2.05-73%  FEV1 1.57-73%  TLC 3.09-62%  RV 1.04-49%  RV/TLC normal DLCO 10.20-52%  DLCO/Va   Consistent with mild restriction and moderate reduction in diffusion capacity. These are significantly worse than her PFTs from 2022, when she had an FVC of 2.75 and a DLCO of 12.62   Echo: 1.27.2024 during admit for renal crisis LVEF >60-65% G1 DD RV normal. IVC normal: Moderate pericardial effusion without signs of tamponade  I saw her for first time in 3/24. Here for f/u with her husband. RHC recommended which showed mild PAH. Reports some tongue swelling on lisinopril - which has  improved with B12 supplementation  RA = 6 RV = 39/12 PA = 39/10 (24) PCW = 6 Fick cardiac output/index = 5.7/3.4 PVR = 3.2 WU Ao sat = 99% PA sat = 74%, 77% SVC sat 75%  Echo 11/18/22: EF 65% RV normal. Moderate pericardial effusion. No tamponade.   cMRI: 02/14/23: EF 53% RV 51% no myocarditis   Echo 03/24/23: EF 60-65% No effusion Personally reviewed  At last visit in 3/24 inflammatory markers still elevated despite CellCept. Started on IVIG 12/23/22. Has had it monthly for 4 months (5 day course each month). Plan at least 6 months. Joint pain much better. Breathing ok. No edema, orthopnea or PND.     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 Gaynell Face at [facility]   systemic sclerosis , diffuse/   rheumotologist:  dr Sherryll Burger at [facility]---  lov 07-22-2017 (as of 07-25-2017 note not available to read)---  per pt has appt. w/ specialist at [facility]   Skin thickening    hands, arms,  legs  caused by scleroderma   Systemic sclerosis (HCC)    UTI (urinary tract infection)     Current Outpatient Medications  Medication Sig Dispense Refill   acetaminophen (TYLENOL) 650 MG CR tablet Take 650 mg by mouth daily as needed for pain.     diphenhydrAMINE (BENADRYL) 12.5 MG chewable tablet Chew 12.5 mg by mouth 4 (four) times daily as needed for allergies.     ELDERBERRY PO Take 1 oz by mouth 4 (four) times daily as needed. Liquid     furosemide (LASIX) 20 MG tablet Take 20 mg by mouth daily.     losartan (COZAAR) 25 MG tablet Take 25 mg by mouth in the morning and at bedtime.     Multiple Vitamin (MULTIVITAMIN) capsule Take 1 capsule by mouth daily.     mycophenolate (CELLCEPT) 500 MG tablet Take 1,500 mg by mouth 2 (two) times daily.     pantoprazole (PROTONIX) 40 MG tablet Take 40 mg by mouth 2 (two) times daily.     sodium chloride (OCEAN) 0.65 % SOLN nasal spray Place 1 spray into both nostrils as needed for congestion.     SYNTHROID 125 MCG tablet Take 1 tablet (125 mcg total) by mouth daily before breakfast. *Must be the brand only* 90 tablet 1   No current facility-administered medications for this encounter.   Facility-Administered Medications Ordered in Other Encounters  Medication Dose Route Frequency Provider Last Rate Last Admin   perflutren lipid microspheres (DEFINITY) IV suspension  1-10 mL Intravenous PRN Denijah Karrer, Bevelyn Buckles, MD   2 mL at 03/24/23 1116    Allergies  Allergen Reactions   Betadine [Povidone Iodine] Rash   Contrast Media [Iodinated Contrast Media] Rash and Other (See Comments)    "per pt: recently had CT 08/ 2018 even through given pre-medication, IVP still caused rash"   Epinephrine Other (See Comments)    Severe headaches    Iodine Rash and Other (See Comments)   Oxycodone Anxiety   Prednisone Anaphylaxis    Per Nephrology pt should avoid this medication because she has Scleroderma and it will interfere with her Kidneys,    Gabapentin  Other (See Comments)    Dizziness   Lidocaine Rash   Nickel Rash and Other (See Comments)    Rash and blisters   Other Rash and Other (See Comments)    Hospital gown   Penicillins Rash and Other (See Comments)    Rash and blisters Has patient had a PCN reaction causing immediate rash, facial/tongue/throat swelling, SOB or lightheadedness with hypotension: Yes Has patient had a PCN reaction causing severe rash involving mucus membranes or skin necrosis: No Has patient had a PCN reaction that required hospitalization: No Has patient had a PCN reaction occurring within the last 10 years: No If all of the above answers are "NO", then may proceed with  Cephalosporin use.    Sulfa Antibiotics Rash      Social History   Socioeconomic History   Marital status: Married    Spouse name: Not on file   Number of children: 1   Years of education: 16   Highest education level: Not on file  Occupational History   Occupation: self employed  Tobacco Use   Smoking status: Never   Smokeless tobacco: Never  Vaping Use   Vaping status: Never Used  Substance and Sexual Activity   Alcohol use: Yes    Comment: socially   Drug use: No   Sexual activity: Yes    Birth control/protection: Post-menopausal  Other Topics Concern   Not on file  Social History Narrative   Lives with husband in a 2 story home.  Has 1 daughter.     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     Education: college.    Social Determinants of Health   Financial Resource Strain: Low Risk  (11/14/2022)   Overall Financial Resource Strain (CARDIA)    Difficulty of Paying Living Expenses: Not hard at all  Food Insecurity: No Food Insecurity (08/09/2022)   Hunger Vital Sign    Worried About Running Out of Food in the Last Year: Never true    Ran Out of Food in the Last Year: Never true  Transportation Needs: No Transportation Needs (08/13/2022)   Received [facility], [facility]    [facility] - Transportation    In the past 12 months, has lack of transportation kept you from medical appointments or from getting medications?: No    Lack of Transportation (Non-Medical): No  Physical Activity: Inactive (11/14/2022)   Exercise Vital Sign    Days of Exercise per Week: 0 days    Minutes of Exercise per Session: 0 min  Stress: No Stress Concern Present (11/14/2022)   [employer] of Occupational Health - Occupational Stress Questionnaire    Feeling of Stress : Not at all  Social Connections: Socially Integrated (11/14/2022)   Social Connection and Isolation Panel [NHANES]    Frequency of Communication with Friends and Family: More than three times a week    Frequency of Social Gatherings with Friends and Family: More than three times a week    Attends Religious Services: 1 to 4 times per year    Active Member of [employer] or Organizations: Yes    Attends Engineer, structural: More than 4 times per year    Marital Status: Married  Catering manager Violence: Not At Risk (08/09/2022)   Humiliation, Afraid, Rape, and Kick questionnaire    Fear of Current or Ex-Partner: No    Emotionally Abused: No    Physically Abused: No    Sexually Abused: No      Family History  Problem Relation Age of Onset   Heart disease Mother    Heart failure Mother    Heart disease Father    Ovarian cancer Maternal Aunt    Heart disease Paternal Uncle        multiple   Autoimmune disease Sister    Arrhythmia Sister    Anemia Sister    CAD Brother    Colon cancer Neg Hx    Stomach cancer Neg Hx    Esophageal cancer Neg Hx    Rectal cancer Neg Hx     Vitals:   03/24/23 1122  BP: (!) 146/78  Pulse: 86  SpO2: 95%  Weight: 59.7 kg (131 lb  9.6 oz)      PHYSICAL EXAM: General:  Weak appearing. No respiratory difficulty HEENT: normal + telengectasias  Neck: supple. no JVD. Carotids 2+ bilat; no bruits. No lymphadenopathy or thryomegaly appreciated. Cor: PMI nondisplaced. Regular  rate & rhythm. No rubs, gallops or murmurs. Lungs: clear Abdomen: soft, nontender, nondistended. No hepatosplenomegaly. No bruits or masses. Good bowel sounds. Extremities: no cyanosis, clubbing, rash, edema + diffuse joint abnormalities and skin thickening  Neuro: alert & orientedx3, cranial nerves grossly intact. moves all 4 extremities w/o difficulty. Affect pleasant  ASSESSMENT & PLAN:  1. Scleroderma c/b ILD - Per Rheum and ILD Clinic - Continue mycophenylate - Now on IVIG for persistent flare in 6/24  2. Pericardial effusion - Echo 1/24: Effusion small to moderate. No tamponade - Echo 11/18/22: EF 65% RV normal. Moderate pericardial effusion (no change). No tamponade.  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 In setting of scleroderma would favor treatment with ERA but given pressures only mildly elevated, I do not think this is urgent. Will wait until current Scleroderma flare under better control then consider repeat RHC. Will see back in 4 months  4. ILD - followed by Dr. Marchelle Gearing  5. Scleroderma renal crisis 1/24 - resolved. Remains on mycophenylate and ACE-I - following with Renal  - had some tongue swelling on lisinopril - resolved with B12 supplementation - I previously switched her to losartan 25 at bedtime -> now on 25 bid (Attempt to keep SBP > 100-130). SBP now running 105-120    Arvilla Meres, MD  12:01 PM

## 2023-03-24 ENCOUNTER — Encounter (HOSPITAL_COMMUNITY): Payer: Self-pay | Admitting: Internal Medicine

## 2023-03-24 ENCOUNTER — Ambulatory Visit (HOSPITAL_COMMUNITY)
Admission: RE | Admit: 2023-03-24 | Discharge: 2023-03-24 | Disposition: A | Discharge status: Home / Self Care | Payer: Medicare Other | Source: Ambulatory Visit | Attending: Internal Medicine | Admitting: Internal Medicine

## 2023-03-24 ENCOUNTER — Ambulatory Visit (HOSPITAL_BASED_OUTPATIENT_CLINIC_OR_DEPARTMENT_OTHER)
Admission: RE | Admit: 2023-03-24 | Discharge: 2023-03-24 | Disposition: A | Discharge status: Home / Self Care | Payer: Medicare Other | Source: Ambulatory Visit | Attending: Internal Medicine | Admitting: Internal Medicine

## 2023-03-24 VITALS — BP 146/78 | HR 86 | Wt 131.6 lb

## 2023-03-24 DIAGNOSIS — Z79624 Long term (current) use of inhibitors of nucleotide synthesis: Secondary | ICD-10-CM | POA: Diagnosis not present

## 2023-03-24 DIAGNOSIS — J849 Interstitial pulmonary disease, unspecified: Secondary | ICD-10-CM | POA: Insufficient documentation

## 2023-03-24 DIAGNOSIS — M3481 Systemic sclerosis with lung involvement: Secondary | ICD-10-CM | POA: Insufficient documentation

## 2023-03-24 DIAGNOSIS — I10 Essential (primary) hypertension: Secondary | ICD-10-CM | POA: Diagnosis not present

## 2023-03-24 DIAGNOSIS — I272 Pulmonary hypertension, unspecified: Secondary | ICD-10-CM

## 2023-03-24 DIAGNOSIS — I7300 Raynaud's syndrome without gangrene: Secondary | ICD-10-CM | POA: Insufficient documentation

## 2023-03-24 DIAGNOSIS — M349 Systemic sclerosis, unspecified: Secondary | ICD-10-CM | POA: Diagnosis not present

## 2023-03-24 DIAGNOSIS — I3139 Other pericardial effusion (noninflammatory): Secondary | ICD-10-CM | POA: Insufficient documentation

## 2023-03-24 DIAGNOSIS — E785 Hyperlipidemia, unspecified: Secondary | ICD-10-CM | POA: Diagnosis not present

## 2023-03-24 DIAGNOSIS — I083 Combined rheumatic disorders of mitral, aortic and tricuspid valves: Secondary | ICD-10-CM | POA: Insufficient documentation

## 2023-03-24 DIAGNOSIS — Z853 Personal history of malignant neoplasm of breast: Secondary | ICD-10-CM | POA: Diagnosis not present

## 2023-03-24 LAB — ECHOCARDIOGRAM COMPLETE
Area-P 1/2: 3.93 cm2
MV M vel: 3.99 m/s
MV Peak grad: 63.7 mmHg
MV VTI: 3.47 cm2
P 1/2 time: 356 ms
S' Lateral: 2.6 cm

## 2023-03-24 MED ORDER — PERFLUTREN LIPID MICROSPHERE
1.0000 mL | INTRAVENOUS | Status: DC | PRN
  Administered 2023-03-24: 2 mL via INTRAVENOUS
  Filled 2023-03-24: qty 10

## 2023-03-24 NOTE — Patient Instructions (Addendum)
No Labs done today.   No medication changes were made. Please continue all current medications as prescribed.  Your physician recommends that you schedule a follow-up appointment in: 4 months with Dr. Gala Romney.  Please contact our office in November  to schedule a January 2025 appointment.   If you have any questions or concerns before your next appointment please send [place] a message through [place] or call our office at [phone].    TO LEAVE A MESSAGE FOR THE NURSE SELECT OPTION 2, PLEASE LEAVE A MESSAGE INCLUDING: YOUR NAME DATE OF BIRTH CALL BACK NUMBER REASON FOR CALL**this is important as we prioritize the call backs  YOU WILL RECEIVE A CALL BACK THE SAME DAY AS LONG AS YOU CALL BEFORE 4:00 PM   Do the following things EVERYDAY: Weigh yourself in the morning before breakfast. Write it down and keep it in a log. Take your medicines as prescribed Eat low salt foods--Limit salt (sodium) to 2000 mg per day.  Stay as active as you can everyday Limit all fluids for the day to less than 2 liters   At the [facility], you and your health needs are our priority. As part of our continuing mission to provide you with exceptional heart care, we have created designated Provider Care Teams. These Care Teams include your primary Cardiologist (physician) and Advanced Practice Providers (APPs- Physician Assistants and Nurse Practitioners) who all work together to provide you with the care you need, when you need it.   You may see any of the following providers on your designated Care Team at your next follow up: Dr Arvilla Meres Dr Marca Ancona Dr. Marcos Eke, NP Robbie Lis, [place] [facility] [place], [place] Brynda Peon, NP Karle Plumber, PharmD   Please be sure to bring in all your medications bottles to every appointment.    Thank you for choosing [facility]

## 2023-03-24 NOTE — Progress Notes (Signed)
  Echocardiogram 2D Echocardiogram has been performed.  Milda Smart 03/24/2023, 11:19 AM

## 2023-03-24 NOTE — Addendum Note (Signed)
Encounter addended by: Chinita Pester, CMA on: 03/24/2023 12:13 PM  Actions taken: Clinical Note Signed

## 2023-03-24 NOTE — Addendum Note (Signed)
Encounter addended by: Chinita Pester, CMA on: 03/24/2023 12:15 PM  Actions taken: Clinical Note Signed

## 2023-03-25 DIAGNOSIS — M6281 Muscle weakness (generalized): Secondary | ICD-10-CM | POA: Diagnosis not present

## 2023-03-25 DIAGNOSIS — R2681 Unsteadiness on feet: Secondary | ICD-10-CM | POA: Diagnosis not present

## 2023-03-25 DIAGNOSIS — M25551 Pain in right hip: Secondary | ICD-10-CM | POA: Diagnosis not present

## 2023-03-25 DIAGNOSIS — M62838 Other muscle spasm: Secondary | ICD-10-CM | POA: Diagnosis not present

## 2023-03-28 DIAGNOSIS — R29898 Other symptoms and signs involving the musculoskeletal system: Secondary | ICD-10-CM | POA: Diagnosis not present

## 2023-03-28 DIAGNOSIS — M79642 Pain in left hand: Secondary | ICD-10-CM | POA: Diagnosis not present

## 2023-03-28 DIAGNOSIS — R531 Weakness: Secondary | ICD-10-CM | POA: Diagnosis not present

## 2023-03-28 DIAGNOSIS — M79643 Pain in unspecified hand: Secondary | ICD-10-CM | POA: Diagnosis not present

## 2023-03-28 DIAGNOSIS — M79641 Pain in right hand: Secondary | ICD-10-CM | POA: Diagnosis not present

## 2023-03-28 DIAGNOSIS — M3489 Other systemic sclerosis: Secondary | ICD-10-CM | POA: Diagnosis not present

## 2023-03-28 DIAGNOSIS — M349 Systemic sclerosis, unspecified: Secondary | ICD-10-CM | POA: Diagnosis not present

## 2023-03-28 DIAGNOSIS — M25641 Stiffness of right hand, not elsewhere classified: Secondary | ICD-10-CM | POA: Diagnosis not present

## 2023-03-28 DIAGNOSIS — M2560 Stiffness of unspecified joint, not elsewhere classified: Secondary | ICD-10-CM | POA: Diagnosis not present

## 2023-03-28 DIAGNOSIS — M25642 Stiffness of left hand, not elsewhere classified: Secondary | ICD-10-CM | POA: Diagnosis not present

## 2023-03-28 DIAGNOSIS — M79644 Pain in right finger(s): Secondary | ICD-10-CM | POA: Diagnosis not present

## 2023-03-28 DIAGNOSIS — M79645 Pain in left finger(s): Secondary | ICD-10-CM | POA: Diagnosis not present

## 2023-04-04 ENCOUNTER — Other Ambulatory Visit (HOSPITAL_COMMUNITY): Payer: Medicare Other

## 2023-04-08 DIAGNOSIS — R0600 Dyspnea, unspecified: Secondary | ICD-10-CM | POA: Diagnosis not present

## 2023-04-08 DIAGNOSIS — I272 Pulmonary hypertension, unspecified: Secondary | ICD-10-CM | POA: Diagnosis not present

## 2023-04-08 DIAGNOSIS — M349 Systemic sclerosis, unspecified: Secondary | ICD-10-CM | POA: Diagnosis not present

## 2023-04-08 DIAGNOSIS — J849 Interstitial pulmonary disease, unspecified: Secondary | ICD-10-CM | POA: Diagnosis not present

## 2023-04-08 DIAGNOSIS — Z79899 Other long term (current) drug therapy: Secondary | ICD-10-CM | POA: Diagnosis not present

## 2023-04-08 DIAGNOSIS — I7300 Raynaud's syndrome without gangrene: Secondary | ICD-10-CM | POA: Diagnosis not present

## 2023-04-09 DIAGNOSIS — J849 Interstitial pulmonary disease, unspecified: Secondary | ICD-10-CM | POA: Diagnosis not present

## 2023-04-09 DIAGNOSIS — R9389 Abnormal findings on diagnostic imaging of other specified body structures: Secondary | ICD-10-CM | POA: Diagnosis not present

## 2023-04-09 DIAGNOSIS — M349 Systemic sclerosis, unspecified: Secondary | ICD-10-CM | POA: Diagnosis not present

## 2023-04-09 DIAGNOSIS — I7300 Raynaud's syndrome without gangrene: Secondary | ICD-10-CM | POA: Diagnosis not present

## 2023-04-09 DIAGNOSIS — I272 Pulmonary hypertension, unspecified: Secondary | ICD-10-CM | POA: Diagnosis not present

## 2023-04-11 DIAGNOSIS — M349 Systemic sclerosis, unspecified: Secondary | ICD-10-CM | POA: Diagnosis not present

## 2023-04-11 DIAGNOSIS — M79643 Pain in unspecified hand: Secondary | ICD-10-CM | POA: Diagnosis not present

## 2023-04-11 DIAGNOSIS — M79642 Pain in left hand: Secondary | ICD-10-CM | POA: Diagnosis not present

## 2023-04-11 DIAGNOSIS — M79644 Pain in right finger(s): Secondary | ICD-10-CM | POA: Diagnosis not present

## 2023-04-11 DIAGNOSIS — M25641 Stiffness of right hand, not elsewhere classified: Secondary | ICD-10-CM | POA: Diagnosis not present

## 2023-04-11 DIAGNOSIS — M2560 Stiffness of unspecified joint, not elsewhere classified: Secondary | ICD-10-CM | POA: Diagnosis not present

## 2023-04-11 DIAGNOSIS — M3489 Other systemic sclerosis: Secondary | ICD-10-CM | POA: Diagnosis not present

## 2023-04-11 DIAGNOSIS — M79641 Pain in right hand: Secondary | ICD-10-CM | POA: Diagnosis not present

## 2023-04-11 DIAGNOSIS — M25642 Stiffness of left hand, not elsewhere classified: Secondary | ICD-10-CM | POA: Diagnosis not present

## 2023-04-11 DIAGNOSIS — M79645 Pain in left finger(s): Secondary | ICD-10-CM | POA: Diagnosis not present

## 2023-04-11 DIAGNOSIS — R531 Weakness: Secondary | ICD-10-CM | POA: Diagnosis not present

## 2023-04-14 ENCOUNTER — Encounter: Payer: Self-pay | Admitting: Hematology & Oncology

## 2023-04-14 ENCOUNTER — Inpatient Hospital Stay: Payer: Medicare Other

## 2023-04-14 ENCOUNTER — Other Ambulatory Visit: Payer: Self-pay

## 2023-04-14 ENCOUNTER — Inpatient Hospital Stay (HOSPITAL_BASED_OUTPATIENT_CLINIC_OR_DEPARTMENT_OTHER): Payer: Medicare Other | Admitting: Hematology & Oncology

## 2023-04-14 VITALS — BP 114/54 | HR 84

## 2023-04-14 VITALS — BP 139/59 | HR 91 | Temp 97.7°F | Resp 20 | Ht 64.0 in | Wt 131.0 lb

## 2023-04-14 DIAGNOSIS — D509 Iron deficiency anemia, unspecified: Secondary | ICD-10-CM

## 2023-04-14 DIAGNOSIS — C50611 Malignant neoplasm of axillary tail of right female breast: Secondary | ICD-10-CM

## 2023-04-14 DIAGNOSIS — Z170 Estrogen receptor positive status [ER+]: Secondary | ICD-10-CM

## 2023-04-14 DIAGNOSIS — M349 Systemic sclerosis, unspecified: Secondary | ICD-10-CM

## 2023-04-14 DIAGNOSIS — Z79899 Other long term (current) drug therapy: Secondary | ICD-10-CM | POA: Diagnosis not present

## 2023-04-14 DIAGNOSIS — I129 Hypertensive chronic kidney disease with stage 1 through stage 4 chronic kidney disease, or unspecified chronic kidney disease: Secondary | ICD-10-CM | POA: Diagnosis not present

## 2023-04-14 DIAGNOSIS — N280 Ischemia and infarction of kidney: Secondary | ICD-10-CM

## 2023-04-14 DIAGNOSIS — M609 Myositis, unspecified: Secondary | ICD-10-CM | POA: Diagnosis not present

## 2023-04-14 DIAGNOSIS — J849 Interstitial pulmonary disease, unspecified: Secondary | ICD-10-CM | POA: Diagnosis not present

## 2023-04-14 DIAGNOSIS — N1831 Chronic kidney disease, stage 3a: Secondary | ICD-10-CM | POA: Diagnosis not present

## 2023-04-14 DIAGNOSIS — D631 Anemia in chronic kidney disease: Secondary | ICD-10-CM | POA: Diagnosis not present

## 2023-04-14 DIAGNOSIS — R978 Other abnormal tumor markers: Secondary | ICD-10-CM | POA: Diagnosis not present

## 2023-04-14 LAB — CBC WITH DIFFERENTIAL (CANCER CENTER ONLY)
Abs Immature Granulocytes: 0.02 10*3/uL (ref 0.00–0.07)
Basophils Absolute: 0 10*3/uL (ref 0.0–0.1)
Basophils Relative: 1 %
Eosinophils Absolute: 0.2 10*3/uL (ref 0.0–0.5)
Eosinophils Relative: 3 %
HCT: 32.2 % — ABNORMAL LOW (ref 36.0–46.0)
Hemoglobin: 10.2 g/dL — ABNORMAL LOW (ref 12.0–15.0)
Immature Granulocytes: 0 %
Lymphocytes Relative: 10 %
Lymphs Abs: 0.5 10*3/uL — ABNORMAL LOW (ref 0.7–4.0)
MCH: 29.3 pg (ref 26.0–34.0)
MCHC: 31.7 g/dL (ref 30.0–36.0)
MCV: 92.5 fL (ref 80.0–100.0)
Monocytes Absolute: 0.5 10*3/uL (ref 0.1–1.0)
Monocytes Relative: 9 %
Neutro Abs: 4.2 10*3/uL (ref 1.7–7.7)
Neutrophils Relative %: 77 %
Platelet Count: 226 10*3/uL (ref 150–400)
RBC: 3.48 MIL/uL — ABNORMAL LOW (ref 3.87–5.11)
RDW: 14.6 % (ref 11.5–15.5)
WBC Count: 5.5 10*3/uL (ref 4.0–10.5)
nRBC: 0 % (ref 0.0–0.2)

## 2023-04-14 LAB — IRON AND IRON BINDING CAPACITY (CC-WL,HP ONLY)
Iron: 78 ug/dL (ref 28–170)
Saturation Ratios: 28 % (ref 10.4–31.8)
TIBC: 281 ug/dL (ref 250–450)
UIBC: 203 ug/dL (ref 148–442)

## 2023-04-14 LAB — CMP (CANCER CENTER ONLY)
ALT: 13 U/L (ref 0–44)
AST: 27 U/L (ref 15–41)
Albumin: 3.6 g/dL (ref 3.5–5.0)
Alkaline Phosphatase: 49 U/L (ref 38–126)
Anion gap: 8 (ref 5–15)
BUN: 17 mg/dL (ref 8–23)
CO2: 26 mmol/L (ref 22–32)
Calcium: 8.8 mg/dL — ABNORMAL LOW (ref 8.9–10.3)
Chloride: 103 mmol/L (ref 98–111)
Creatinine: 1.09 mg/dL — ABNORMAL HIGH (ref 0.44–1.00)
GFR, Estimated: 54 mL/min — ABNORMAL LOW (ref >60)
Glucose, Bld: 96 mg/dL (ref 70–99)
Potassium: 3.5 mmol/L (ref 3.5–5.1)
Sodium: 137 mmol/L (ref 135–145)
Total Bilirubin: 0.5 mg/dL (ref 0.3–1.2)
Total Protein: 7 g/dL (ref 6.5–8.1)

## 2023-04-14 LAB — FERRITIN: Ferritin: 152 ng/mL (ref 11–307)

## 2023-04-14 LAB — LACTATE DEHYDROGENASE: LDH: 194 U/L — ABNORMAL HIGH (ref 98–192)

## 2023-04-14 MED ORDER — IMMUNE GLOBULIN (HUMAN) 20 GM/200ML IV SOLN
25.0000 g | Freq: Once | INTRAVENOUS | Status: AC
  Administered 2023-04-14: 25 g via INTRAVENOUS
  Filled 2023-04-14: qty 200

## 2023-04-14 MED ORDER — ACETAMINOPHEN 325 MG PO TABS: 650.0000 mg | ORAL_TABLET | Freq: Once | ORAL | Status: DC

## 2023-04-14 MED ORDER — DEXTROSE 5 % IV SOLN: INTRAVENOUS | Status: DC

## 2023-04-14 MED ORDER — DARBEPOETIN ALFA 300 MCG/0.6ML IJ SOSY
300.0000 ug | PREFILLED_SYRINGE | Freq: Once | INTRAMUSCULAR | Status: AC
  Administered 2023-04-14: 300 ug via SUBCUTANEOUS
  Filled 2023-04-14: qty 0.6

## 2023-04-14 MED ORDER — DIPHENHYDRAMINE HCL 25 MG PO CAPS: 25.0000 mg | ORAL_CAPSULE | Freq: Once | ORAL | Status: DC

## 2023-04-14 MED ORDER — HEPARIN SOD (PORK) LOCK FLUSH 100 UNIT/ML IV SOLN
500.0000 [IU] | Freq: Once | INTRAVENOUS | Status: AC
  Administered 2023-04-14: 500 [IU] via INTRAVENOUS

## 2023-04-14 MED ORDER — SODIUM CHLORIDE 0.9% FLUSH
10.0000 mL | INTRAVENOUS | Status: DC | PRN
  Administered 2023-04-14: 10 mL via INTRAVENOUS

## 2023-04-14 NOTE — Patient Instructions (Signed)

## 2023-04-14 NOTE — Patient Instructions (Addendum)
Implanted Port Home Guide An implanted port is a device that is placed under the skin. It is usually placed in the chest. The device may vary based on the need. Implanted ports can be used to give IV medicine, to take blood, or to give fluids. You may have an implanted port if: You need IV medicine that would be irritating to the small veins in your hands or arms. You need IV medicines, such as chemotherapy, for a Stege period of time. You need IV nutrition for a Axon period of time. You may have fewer limitations when using a port than you would if you used other types of Mumme-term IVs. You will also likely be able to return to normal activities after your incision heals. An implanted port has two main parts: Reservoir. The reservoir is the part where a needle is inserted to give medicines or draw blood. The reservoir is round. After the port is placed, it appears as a small, raised area under your skin. Catheter. The catheter is a small, thin tube that connects the reservoir to a vein. Medicine that is inserted into the reservoir goes into the catheter and then into the vein. How is my port accessed? To access your port: A numbing cream may be placed on the skin over the port site. Your health care provider will put on a mask and sterile gloves. The skin over your port will be cleaned carefully with a germ-killing soap and allowed to dry. Your health care provider will gently pinch the port and insert a needle into it. Your health care provider will check for a blood return to make sure the port is in the vein and is still working (patent). If your port needs to remain accessed to get medicine continuously (constant infusion), your health care provider will place a clear bandage (dressing) over the needle site. The dressing and needle will need to be changed every week, or as told by your health care provider. What is flushing? Flushing helps keep the port working. Follow instructions from your  health care provider about how and when to flush the port. Ports are usually flushed with saline solution or a medicine called heparin. The need for flushing will depend on how the port is used: If the port is only used from time to time to give medicines or draw blood, the port may need to be flushed: Before and after medicines have been given. Before and after blood has been drawn. As part of routine maintenance. Flushing may be recommended every 4-6 weeks. If a constant infusion is running, the port may not need to be flushed. Throw away any syringes in a disposal container that is meant for sharp items (sharps container). You can buy a sharps container from a pharmacy, or you can make one by using an empty hard plastic bottle with a cover. How Frangos will my port stay implanted? The port can stay in for as Dotts as your health care provider thinks it is needed. When it is time for the port to come out, a surgery will be done to remove it. The surgery will be similar to the procedure that was done to put the port in. Follow these instructions at home: Caring for your port and port site Flush your port as told by your health care provider. If you need an infusion over several days, follow instructions from your health care provider about how to take care of your port site. Make sure you: Change your   dressing as told by your health care provider. Wash your hands with soap and water for at least 20 seconds before and after you change your dressing. If soap and water are not available, use alcohol-based hand sanitizer. Place any used dressings or infusion bags into a plastic bag. Throw that bag in the trash. Keep the dressing that covers the needle clean and dry. Do not get it wet. Do not use scissors or sharp objects near the infusion tubing. Keep any external tubes clamped, unless they are being used. Check your port site every day for signs of infection. Check for: Redness, swelling, or  pain. Fluid or blood. Warmth. Pus or a bad smell. Protect the skin around the port site. Avoid wearing bra straps that rub or irritate the site. Protect the skin around your port from seat belts. Place a soft pad over your chest if needed. Bathe or shower as told by your health care provider. The site may get wet as Depree as you are not actively receiving an infusion. General instructions  Return to your normal activities as told by your health care provider. Ask your health care provider what activities are safe for you. Carry a medical alert card or wear a medical alert bracelet at all times. This will let health care providers know that you have an implanted port in case of an emergency. Where to find more information American Cancer Society: www.cancer.org American Society of Clinical Oncology: www.cancer.net Contact a health care provider if: You have a fever or chills. You have redness, swelling, or pain at the port site. You have fluid or blood coming from your port site. Your incision feels warm to the touch. You have pus or a bad smell coming from the port site. Summary Implanted ports are usually placed in the chest for Hunkele-term IV access. Follow instructions from your health care provider about flushing the port and changing bandages (dressings). Take care of the area around your port by avoiding clothing that puts pressure on the area, and by watching for signs of infection. Protect the skin around your port from seat belts. Place a soft pad over your chest if needed. Contact a health care provider if you have a fever or you have redness, swelling, pain, fluid, or a bad smell at the port site. This information is not intended to replace advice given to you by your health care provider. Make sure you discuss any questions you have with your health care provider. Document Revised: 01/02/2021 Document Reviewed: 01/02/2021 Elsevier Patient Education  2024 [employer] [facility]ed [facility] Guide An implanted port is a device that is placed under the skin. It is usually placed in the chest. The device may vary based on the need. Implanted ports can be used to give IV medicine, to take blood, or to give fluids. You may have an implanted port if: You need IV medicine that would be irritating to the small veins in your hands or arms. You need IV medicines, such as chemotherapy, for a Shipp period of time. You need IV nutrition for a Ligman period of time. You may have fewer limitations when using a port than you would if you used other types of Kammerer-term IVs. You will also likely be able to return to normal activities after your incision heals. An implanted port has two main parts: Reservoir. The reservoir is the part where a needle is inserted to give medicines or draw blood. The reservoir is round. After the port is  placed, it appears as a small, raised area under your skin. Catheter. The catheter is a small, thin tube that connects the reservoir to a vein. Medicine that is inserted into the reservoir goes into the catheter and then into the vein. How is my port accessed? To access your port: A numbing cream may be placed on the skin over the port site. Your health care provider will put on a mask and sterile gloves. The skin over your port will be cleaned carefully with a germ-killing soap and allowed to dry. Your health care provider will gently pinch the port and insert a needle into it. Your health care provider will check for a blood return to make sure the port is in the vein and is still working (patent). If your port needs to remain accessed to get medicine continuously (constant infusion), your health care provider will place a clear bandage (dressing) over the needle site. The dressing and needle will need to be changed every week, or as told by your health care provider. What is flushing? Flushing helps keep the port working. Follow instructions from  your health care provider about how and when to flush the port. Ports are usually flushed with saline solution or a medicine called heparin. The need for flushing will depend on how the port is used: If the port is only used from time to time to give medicines or draw blood, the port may need to be flushed: Before and after medicines have been given. Before and after blood has been drawn. As part of routine maintenance. Flushing may be recommended every 4-6 weeks. If a constant infusion is running, the port may not need to be flushed. Throw away any syringes in a disposal container that is meant for sharp items (sharps container). You can buy a sharps container from a pharmacy, or you can make one by using an empty hard plastic bottle with a cover. How Szczerba will my port stay implanted? The port can stay in for as Caldron as your health care provider thinks it is needed. When it is time for the port to come out, a surgery will be done to remove it. The surgery will be similar to the procedure that was done to put the port in. Follow these instructions at home: Caring for your port and port site Flush your port as told by your health care provider. If you need an infusion over several days, follow instructions from your health care provider about how to take care of your port site. Make sure you: Change your dressing as told by your health care provider. Wash your hands with soap and water for at least 20 seconds before and after you change your dressing. If soap and water are not available, use alcohol-based hand sanitizer. Place any used dressings or infusion bags into a plastic bag. Throw that bag in the trash. Keep the dressing that covers the needle clean and dry. Do not get it wet. Do not use scissors or sharp objects near the infusion tubing. Keep any external tubes clamped, unless they are being used. Check your port site every day for signs of infection. Check for: Redness, swelling, or  pain. Fluid or blood. Warmth. Pus or a bad smell. Protect the skin around the port site. Avoid wearing bra straps that rub or irritate the site. Protect the skin around your port from seat belts. Place a soft pad over your chest if needed. Bathe or shower as told by your health care  provider. The site may get wet as Gilham as you are not actively receiving an infusion. General instructions  Return to your normal activities as told by your health care provider. Ask your health care provider what activities are safe for you. Carry a medical alert card or wear a medical alert bracelet at all times. This will let health care providers know that you have an implanted port in case of an emergency. Where to find more information American Cancer Society: www.cancer.org American Society of Clinical Oncology: www.cancer.net Contact a health care provider if: You have a fever or chills. You have redness, swelling, or pain at the port site. You have fluid or blood coming from your port site. Your incision feels warm to the touch. You have pus or a bad smell coming from the port site. Summary Implanted ports are usually placed in the chest for Cudworth-term IV access. Follow instructions from your health care provider about flushing the port and changing bandages (dressings). Take care of the area around your port by avoiding clothing that puts pressure on the area, and by watching for signs of infection. Protect the skin around your port from seat belts. Place a soft pad over your chest if needed. Contact a health care provider if you have a fever or you have redness, swelling, pain, fluid, or a bad smell at the port site. This information is not intended to replace advice given to you by your health care provider. Make sure you discuss any questions you have with your health care provider. Document Revised: 01/02/2021 Document Reviewed: 01/02/2021 Elsevier Patient Education  2024 [employer].

## 2023-04-14 NOTE — Progress Notes (Signed)
Pt came in for lab draw and treatment. Pt informed that the outside labs requested by [facility] may not be covered by her insurance company. Pt verbalized understanding and was in full understanding of potential cost.

## 2023-04-14 NOTE — Progress Notes (Signed)
Hematology and Oncology Follow Up Visit  [name] [id number] [birth year] 72 y.o. 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  She seems to be doing pretty well.  She was seen by her Rheumatologist up in [place] last week.  She was very pleased with how well [name] was doing.  All of the test were positive for her improvement of her lung function.  She is swallowing better.  The IVIG certainly is making a difference.  She also is doing well on the Aranesp.  We just give this to her once a month.  She will have a good dose today.  She has had no issues with nausea or vomiting.  She had no obvious trouble with bowels or bladder.  Patient does have the scleroderma.  She is mostly affected by the scleroderma.  This is affected multiple body parts.  She needs to have lab work done today to see if the scleroderma is active or quiescent.  She has had no fever.  There has been no chest pain.  She has had no headache.  She has had no leg swelling.  There has been no rashes.  She says that her body hurts for about 5 days after she gets the Aranesp.  I am not sure exactly what to make of this.  We will have to just see how she does.  Of note, her last CA 27.29 was 39.5.  This is gradually coming down.  Overall, I would have to say that her performance status is probably ECOG 1.    Medications:  Current Outpatient Medications:    acetaminophen (TYLENOL) 650 MG CR tablet, Take 650 mg by mouth daily as needed for pain., Disp: , Rfl:    diphenhydrAMINE (BENADRYL) 12.5 MG chewable tablet, Chew 12.5 mg by mouth 4 (four) times daily as needed for allergies., Disp: ,  Rfl:    ELDERBERRY PO, Take 1 oz by mouth 4 (four) times daily as needed. Liquid, Disp: , Rfl:    furosemide (LASIX) 20 MG tablet, Take 20 mg by mouth daily., Disp: , Rfl:    losartan (COZAAR) 25 MG tablet, Take 25 mg by mouth in the morning and at bedtime., Disp: , Rfl:    Multiple Vitamin (MULTIVITAMIN) capsule, Take 1 capsule by mouth daily., Disp: , Rfl:    mycophenolate (CELLCEPT) 500 MG tablet, Take 1,500 mg by mouth 2 (two) times daily., Disp: , Rfl:    pantoprazole (PROTONIX) 40 MG tablet, Take 40 mg by mouth 2 (two) times daily., Disp: , Rfl:    sodium chloride (OCEAN) 0.65 % SOLN nasal spray, Place 1 spray into both nostrils as needed for congestion., Disp: , Rfl:    SYNTHROID 125 MCG tablet, Take 1 tablet (125 mcg total) by mouth daily before breakfast. *Must be the brand only*, Disp: 90 tablet, Rfl: 1  Allergies:  Allergies  Allergen Reactions   Betadine [Povidone Iodine] Rash   Contrast Media [Iodinated Contrast Media] Rash and Other (See Comments)    "per pt: recently had CT 08/ 2018 even through given pre-medication, IVP still caused rash"   Epinephrine Other (See Comments)    Severe headaches    Iodine Rash  and Other (See Comments)   Oxycodone Anxiety   Prednisone Anaphylaxis    Per Nephrology pt should avoid this medication because she has Scleroderma and it will interfere with her Kidneys,    Gabapentin Other (See Comments)    Dizziness   Lidocaine Rash   Nickel Rash and Other (See Comments)    Rash and blisters   Other Rash and Other (See Comments)    Hospital gown   Penicillins Rash and Other (See Comments)    Rash and blisters Has patient had a PCN reaction causing immediate rash, facial/tongue/throat swelling, SOB or lightheadedness with hypotension: Yes Has patient had a PCN reaction causing severe rash involving mucus membranes or skin necrosis: No Has patient had a PCN reaction that required hospitalization: No Has patient had a PCN reaction occurring  within the last 10 years: No If all of the above answers are "NO", then may proceed with Cephalosporin use.    Sulfa Antibiotics Rash    Past Medical History, Surgical history, Social history, and Family History were reviewed and updated.  Review of Systems: Review of Systems  Constitutional:  Positive for fatigue.  HENT:  Negative.    Eyes: Negative.   Respiratory: Negative.    Cardiovascular: Negative.   Gastrointestinal: Negative.   Endocrine: Negative.   Genitourinary: Negative.    Musculoskeletal:  Positive for arthralgias and myalgias.  Skin:  Positive for rash.  Neurological: Negative.   Hematological: Negative.   Psychiatric/Behavioral: Negative.      Physical Exam: Temperature is 97.7.  Pulse 91.  Blood pressure 139/59.  Weight is 131 pounds.    Wt Readings from Last 3 Encounters:  04/14/23 131 lb (59.4 kg)  03/24/23 131 lb 9.6 oz (59.7 kg)  03/14/23 130 lb (59 kg)    Physical Exam Vitals reviewed.  Constitutional:      Comments: Her breast exam shows right breast with no masses, edema or erythema.  There is no right axillary adenopathy.  Left breast shows the lumpectomy scar at about the 2 o'clock position.  There is little firmness at the lumpectomy site.  No distinct masses noted.  There is no left axillary adenopathy.  There is no left nipple discharge.  HENT:     Head: Normocephalic and atraumatic.  Eyes:     Pupils: Pupils are equal, round, and reactive to light.  Cardiovascular:     Rate and Rhythm: Normal rate and regular rhythm.     Heart sounds: Normal heart sounds.  Pulmonary:     Effort: Pulmonary effort is normal.     Breath sounds: Normal breath sounds.  Abdominal:     General: Bowel sounds are normal.     Palpations: Abdomen is soft.  Musculoskeletal:        General: No tenderness or deformity. Normal range of motion.     Cervical back: Normal range of motion.  Lymphadenopathy:     Cervical: No cervical adenopathy.  Skin:    General:  Skin is warm and dry.     Findings: No erythema or rash.     Comments: Skin exam shows some changes of the scleroderma.  This is mostly on the face, around the lip area.  Her arms do show a little bit of skin thickening.  Neurological:     Mental Status: She is alert and oriented to person, place, and time.  Psychiatric:        Behavior: Behavior normal.        Thought Content: Thought  content normal.        Judgment: Judgment normal.     Lab Results  Component Value Date   WBC 5.5 04/14/2023   HGB 10.2 (L) 04/14/2023   HCT 32.2 (L) 04/14/2023   MCV 92.5 04/14/2023   PLT 226 04/14/2023     Chemistry      Component Value Date/Time   NA 134 (L) 03/14/2023 1001   NA 144 01/27/2020 1148   NA 141 06/09/2017 1117   K 3.3 (L) 03/14/2023 1001   K 3.9 06/09/2017 1117   CL 102 03/14/2023 1001   CL 109 (H) 12/14/2012 1253   CO2 24 03/14/2023 1001   CO2 23 06/09/2017 1117   BUN 18 03/14/2023 1001   BUN 11 01/27/2020 1148   BUN 11.5 06/09/2017 1117   CREATININE 1.20 (H) 03/14/2023 1001   CREATININE 1.2 (H) 06/09/2017 1117      Component Value Date/Time   CALCIUM 8.8 (L) 03/14/2023 1001   CALCIUM 8.9 06/09/2017 1117   ALKPHOS 60 03/14/2023 1001   ALKPHOS 40 06/09/2017 1117   AST 45 (H) 03/14/2023 1001   AST 35 (H) 06/09/2017 1117   ALT 24 03/14/2023 1001   ALT 19 06/09/2017 1117   BILITOT 0.4 03/14/2023 1001   BILITOT 0.37 06/09/2017 1117      Impression and Plan: [name] is a very charming 72 year old white female.  She has had a history of early stage breast cancer.  So far, we have not found any problems with respect to recurrent disease.  We always have to check her CA 27.29.  I am glad to see that the IVIG is helping her scleroderma.  Is helping her swallowing.  Seems to be helping her lung function.  We are going to check some rheumatologic studies on her today.  We will see what they look like.  I am just happy that her quality life is better.  Despite the  elevated CA 27.29, we really cannot find any evidence of breast cancer recurrence.  I do not know of the elevated tumor marker might be somehow reflective of the scleroderma.  We will plan to get her back to see [place] in another month.     Josph Macho, MD 9/30/20249:22 AM

## 2023-04-15 ENCOUNTER — Inpatient Hospital Stay: Payer: Medicare Other | Attending: Hematology & Oncology

## 2023-04-15 VITALS — BP 120/52 | HR 86 | Temp 98.0°F | Resp 20 | Ht 64.0 in | Wt 132.0 lb

## 2023-04-15 DIAGNOSIS — D509 Iron deficiency anemia, unspecified: Secondary | ICD-10-CM

## 2023-04-15 DIAGNOSIS — Z5112 Encounter for antineoplastic immunotherapy: Secondary | ICD-10-CM | POA: Insufficient documentation

## 2023-04-15 DIAGNOSIS — M349 Systemic sclerosis, unspecified: Secondary | ICD-10-CM | POA: Insufficient documentation

## 2023-04-15 LAB — IGG, IGA, IGM
IgA: 119 mg/dL (ref 64–422)
IgG (Immunoglobin G), Serum: 1275 mg/dL (ref 586–1602)
IgM (Immunoglobulin M), Srm: 51 mg/dL (ref 26–217)

## 2023-04-15 LAB — KAPPA/LAMBDA LIGHT CHAINS
Kappa free light chain: 17 mg/L (ref 3.3–19.4)
Kappa, lambda light chain ratio: 1.14 (ref 0.26–1.65)
Lambda free light chains: 14.9 mg/L (ref 5.7–26.3)

## 2023-04-15 LAB — CANCER ANTIGEN 27.29: CA 27.29: 40 U/mL — ABNORMAL HIGH (ref 0.0–38.6)

## 2023-04-15 MED ORDER — HEPARIN SOD (PORK) LOCK FLUSH 100 UNIT/ML IV SOLN
500.0000 [IU] | Freq: Once | INTRAVENOUS | Status: AC
  Administered 2023-04-15: 500 [IU] via INTRAVENOUS

## 2023-04-15 MED ORDER — DIPHENHYDRAMINE HCL 25 MG PO CAPS: 25.0000 mg | ORAL_CAPSULE | Freq: Once | ORAL | Status: DC

## 2023-04-15 MED ORDER — IMMUNE GLOBULIN (HUMAN) 20 GM/200ML IV SOLN
25.0000 g | Freq: Once | INTRAVENOUS | Status: AC
  Administered 2023-04-15: 25 g via INTRAVENOUS
  Filled 2023-04-15: qty 200

## 2023-04-15 MED ORDER — ACETAMINOPHEN 325 MG PO TABS: 650.0000 mg | ORAL_TABLET | Freq: Once | ORAL | Status: DC

## 2023-04-15 MED ORDER — DEXTROSE 5 % IV SOLN: INTRAVENOUS | Status: DC

## 2023-04-15 MED ORDER — SODIUM CHLORIDE 0.9% FLUSH
10.0000 mL | INTRAVENOUS | Status: DC | PRN
  Administered 2023-04-15: 10 mL via INTRAVENOUS

## 2023-04-15 NOTE — Patient Instructions (Signed)

## 2023-04-16 ENCOUNTER — Inpatient Hospital Stay: Payer: Medicare Other

## 2023-04-16 VITALS — BP 133/68 | HR 75 | Temp 97.7°F | Resp 18

## 2023-04-16 DIAGNOSIS — M349 Systemic sclerosis, unspecified: Secondary | ICD-10-CM | POA: Diagnosis not present

## 2023-04-16 DIAGNOSIS — D509 Iron deficiency anemia, unspecified: Secondary | ICD-10-CM

## 2023-04-16 MED ORDER — HEPARIN SOD (PORK) LOCK FLUSH 100 UNIT/ML IV SOLN
500.0000 [IU] | Freq: Once | INTRAVENOUS | Status: AC
  Administered 2023-04-16: 500 [IU] via INTRAVENOUS

## 2023-04-16 MED ORDER — ACETAMINOPHEN 325 MG PO TABS: 650.0000 mg | ORAL_TABLET | Freq: Once | ORAL | Status: DC

## 2023-04-16 MED ORDER — DIPHENHYDRAMINE HCL 25 MG PO CAPS: 25.0000 mg | ORAL_CAPSULE | Freq: Once | ORAL | Status: DC

## 2023-04-16 MED ORDER — SODIUM CHLORIDE 0.9% FLUSH
10.0000 mL | Freq: Once | INTRAVENOUS | Status: AC
  Administered 2023-04-16: 10 mL

## 2023-04-16 MED ORDER — IMMUNE GLOBULIN (HUMAN) 20 GM/200ML IV SOLN
25.0000 g | Freq: Once | INTRAVENOUS | Status: AC
  Administered 2023-04-16: 25 g via INTRAVENOUS
  Filled 2023-04-16: qty 200

## 2023-04-16 MED ORDER — DEXTROSE 5 % IV SOLN: INTRAVENOUS | Status: DC

## 2023-04-16 NOTE — Progress Notes (Signed)
Patient does not want to stay for the recommended 30 minute post IVIG observation. Patient discharged ambulatory without complaints or concerns.

## 2023-04-16 NOTE — Patient Instructions (Signed)

## 2023-04-17 ENCOUNTER — Inpatient Hospital Stay: Payer: Medicare Other

## 2023-04-17 VITALS — BP 143/65 | HR 52 | Temp 97.6°F | Resp 17

## 2023-04-17 DIAGNOSIS — D509 Iron deficiency anemia, unspecified: Secondary | ICD-10-CM

## 2023-04-17 DIAGNOSIS — M349 Systemic sclerosis, unspecified: Secondary | ICD-10-CM | POA: Diagnosis not present

## 2023-04-17 MED ORDER — IMMUNE GLOBULIN (HUMAN) 20 GM/200ML IV SOLN
25.0000 g | Freq: Once | INTRAVENOUS | Status: AC
  Administered 2023-04-17: 25 g via INTRAVENOUS
  Filled 2023-04-17: qty 250

## 2023-04-17 MED ORDER — ACETAMINOPHEN 325 MG PO TABS: 650.0000 mg | ORAL_TABLET | Freq: Once | ORAL | Status: DC

## 2023-04-17 MED ORDER — DEXTROSE 5 % IV SOLN: INTRAVENOUS | Status: DC

## 2023-04-17 MED ORDER — DIPHENHYDRAMINE HCL 25 MG PO CAPS: 25.0000 mg | ORAL_CAPSULE | Freq: Once | ORAL | Status: DC

## 2023-04-17 NOTE — Patient Instructions (Signed)

## 2023-04-18 ENCOUNTER — Inpatient Hospital Stay: Payer: Medicare Other

## 2023-04-18 VITALS — BP 114/61 | HR 91 | Temp 89.0°F | Resp 16

## 2023-04-18 DIAGNOSIS — D509 Iron deficiency anemia, unspecified: Secondary | ICD-10-CM

## 2023-04-18 DIAGNOSIS — M349 Systemic sclerosis, unspecified: Secondary | ICD-10-CM | POA: Diagnosis not present

## 2023-04-18 MED ORDER — DIPHENHYDRAMINE HCL 25 MG PO CAPS: 25.0000 mg | ORAL_CAPSULE | Freq: Once | ORAL | Status: DC

## 2023-04-18 MED ORDER — IMMUNE GLOBULIN (HUMAN) 20 GM/200ML IV SOLN
25.0000 g | Freq: Once | INTRAVENOUS | Status: AC
  Administered 2023-04-18: 25 g via INTRAVENOUS
  Filled 2023-04-18: qty 250

## 2023-04-18 MED ORDER — DEXTROSE 5 % IV SOLN: INTRAVENOUS | Status: DC

## 2023-04-18 MED ORDER — ACETAMINOPHEN 325 MG PO TABS: 650.0000 mg | ORAL_TABLET | Freq: Once | ORAL | Status: DC

## 2023-04-18 NOTE — Patient Instructions (Signed)

## 2023-04-22 DIAGNOSIS — M6281 Muscle weakness (generalized): Secondary | ICD-10-CM | POA: Diagnosis not present

## 2023-04-22 DIAGNOSIS — R2681 Unsteadiness on feet: Secondary | ICD-10-CM | POA: Diagnosis not present

## 2023-04-22 DIAGNOSIS — M25551 Pain in right hip: Secondary | ICD-10-CM | POA: Diagnosis not present

## 2023-04-22 DIAGNOSIS — M62838 Other muscle spasm: Secondary | ICD-10-CM | POA: Diagnosis not present

## 2023-04-24 ENCOUNTER — Encounter: Payer: Self-pay | Admitting: Family Medicine

## 2023-04-24 MED ORDER — FUROSEMIDE 20 MG PO TABS: 20.0000 mg | ORAL_TABLET | Freq: Every day | ORAL | 1 refills | Status: DC

## 2023-04-29 DIAGNOSIS — M62838 Other muscle spasm: Secondary | ICD-10-CM | POA: Diagnosis not present

## 2023-04-29 DIAGNOSIS — R2681 Unsteadiness on feet: Secondary | ICD-10-CM | POA: Diagnosis not present

## 2023-04-29 DIAGNOSIS — M25551 Pain in right hip: Secondary | ICD-10-CM | POA: Diagnosis not present

## 2023-04-29 DIAGNOSIS — M6281 Muscle weakness (generalized): Secondary | ICD-10-CM | POA: Diagnosis not present

## 2023-04-30 DIAGNOSIS — N280 Ischemia and infarction of kidney: Secondary | ICD-10-CM | POA: Diagnosis not present

## 2023-04-30 DIAGNOSIS — N1831 Chronic kidney disease, stage 3a: Secondary | ICD-10-CM | POA: Diagnosis not present

## 2023-04-30 DIAGNOSIS — I129 Hypertensive chronic kidney disease with stage 1 through stage 4 chronic kidney disease, or unspecified chronic kidney disease: Secondary | ICD-10-CM | POA: Diagnosis not present

## 2023-04-30 DIAGNOSIS — E876 Hypokalemia: Secondary | ICD-10-CM | POA: Diagnosis not present

## 2023-04-30 DIAGNOSIS — D631 Anemia in chronic kidney disease: Secondary | ICD-10-CM | POA: Diagnosis not present

## 2023-04-30 DIAGNOSIS — M349 Systemic sclerosis, unspecified: Secondary | ICD-10-CM | POA: Diagnosis not present

## 2023-05-06 DIAGNOSIS — M79642 Pain in left hand: Secondary | ICD-10-CM | POA: Diagnosis not present

## 2023-05-06 DIAGNOSIS — R531 Weakness: Secondary | ICD-10-CM | POA: Diagnosis not present

## 2023-05-06 DIAGNOSIS — M25641 Stiffness of right hand, not elsewhere classified: Secondary | ICD-10-CM | POA: Diagnosis not present

## 2023-05-06 DIAGNOSIS — M349 Systemic sclerosis, unspecified: Secondary | ICD-10-CM | POA: Diagnosis not present

## 2023-05-06 DIAGNOSIS — M79641 Pain in right hand: Secondary | ICD-10-CM | POA: Diagnosis not present

## 2023-05-06 DIAGNOSIS — M79643 Pain in unspecified hand: Secondary | ICD-10-CM | POA: Diagnosis not present

## 2023-05-06 DIAGNOSIS — M25642 Stiffness of left hand, not elsewhere classified: Secondary | ICD-10-CM | POA: Diagnosis not present

## 2023-05-06 DIAGNOSIS — M79645 Pain in left finger(s): Secondary | ICD-10-CM | POA: Diagnosis not present

## 2023-05-06 DIAGNOSIS — M2560 Stiffness of unspecified joint, not elsewhere classified: Secondary | ICD-10-CM | POA: Diagnosis not present

## 2023-05-06 DIAGNOSIS — M79644 Pain in right finger(s): Secondary | ICD-10-CM | POA: Diagnosis not present

## 2023-05-13 DIAGNOSIS — M62838 Other muscle spasm: Secondary | ICD-10-CM | POA: Diagnosis not present

## 2023-05-13 DIAGNOSIS — M6281 Muscle weakness (generalized): Secondary | ICD-10-CM | POA: Diagnosis not present

## 2023-05-13 DIAGNOSIS — R2681 Unsteadiness on feet: Secondary | ICD-10-CM | POA: Diagnosis not present

## 2023-05-13 DIAGNOSIS — M25551 Pain in right hip: Secondary | ICD-10-CM | POA: Diagnosis not present

## 2023-05-15 ENCOUNTER — Other Ambulatory Visit: Payer: Self-pay

## 2023-05-15 ENCOUNTER — Encounter: Payer: Self-pay | Admitting: Hematology & Oncology

## 2023-05-15 DIAGNOSIS — D499 Neoplasm of unspecified behavior of unspecified site: Secondary | ICD-10-CM

## 2023-05-16 DIAGNOSIS — M349 Systemic sclerosis, unspecified: Secondary | ICD-10-CM | POA: Diagnosis not present

## 2023-05-16 DIAGNOSIS — M25642 Stiffness of left hand, not elsewhere classified: Secondary | ICD-10-CM | POA: Diagnosis not present

## 2023-05-16 DIAGNOSIS — M79644 Pain in right finger(s): Secondary | ICD-10-CM | POA: Diagnosis not present

## 2023-05-16 DIAGNOSIS — M3489 Other systemic sclerosis: Secondary | ICD-10-CM | POA: Diagnosis not present

## 2023-05-16 DIAGNOSIS — M25641 Stiffness of right hand, not elsewhere classified: Secondary | ICD-10-CM | POA: Diagnosis not present

## 2023-05-16 DIAGNOSIS — R531 Weakness: Secondary | ICD-10-CM | POA: Diagnosis not present

## 2023-05-16 DIAGNOSIS — M79641 Pain in right hand: Secondary | ICD-10-CM | POA: Diagnosis not present

## 2023-05-16 DIAGNOSIS — M79645 Pain in left finger(s): Secondary | ICD-10-CM | POA: Diagnosis not present

## 2023-05-16 DIAGNOSIS — M2560 Stiffness of unspecified joint, not elsewhere classified: Secondary | ICD-10-CM | POA: Diagnosis not present

## 2023-05-16 DIAGNOSIS — M79643 Pain in unspecified hand: Secondary | ICD-10-CM | POA: Diagnosis not present

## 2023-05-16 DIAGNOSIS — M79642 Pain in left hand: Secondary | ICD-10-CM | POA: Diagnosis not present

## 2023-05-19 ENCOUNTER — Telehealth: Payer: Self-pay

## 2023-05-19 ENCOUNTER — Other Ambulatory Visit: Payer: Self-pay

## 2023-05-19 ENCOUNTER — Other Ambulatory Visit: Payer: Self-pay | Admitting: *Deleted

## 2023-05-19 ENCOUNTER — Inpatient Hospital Stay: Payer: Medicare Other

## 2023-05-19 ENCOUNTER — Inpatient Hospital Stay: Payer: Medicare Other | Attending: Hematology & Oncology

## 2023-05-19 ENCOUNTER — Inpatient Hospital Stay (HOSPITAL_BASED_OUTPATIENT_CLINIC_OR_DEPARTMENT_OTHER): Payer: Medicare Other | Admitting: Hematology & Oncology

## 2023-05-19 VITALS — BP 117/57 | HR 79 | Temp 97.6°F | Resp 16 | Ht 64.0 in | Wt 132.0 lb

## 2023-05-19 DIAGNOSIS — D509 Iron deficiency anemia, unspecified: Secondary | ICD-10-CM

## 2023-05-19 DIAGNOSIS — D500 Iron deficiency anemia secondary to blood loss (chronic): Secondary | ICD-10-CM

## 2023-05-19 DIAGNOSIS — Z923 Personal history of irradiation: Secondary | ICD-10-CM | POA: Diagnosis not present

## 2023-05-19 DIAGNOSIS — D631 Anemia in chronic kidney disease: Secondary | ICD-10-CM | POA: Diagnosis not present

## 2023-05-19 DIAGNOSIS — G130 Paraneoplastic neuromyopathy and neuropathy: Secondary | ICD-10-CM

## 2023-05-19 DIAGNOSIS — C50912 Malignant neoplasm of unspecified site of left female breast: Secondary | ICD-10-CM | POA: Insufficient documentation

## 2023-05-19 DIAGNOSIS — M349 Systemic sclerosis, unspecified: Secondary | ICD-10-CM

## 2023-05-19 DIAGNOSIS — C50612 Malignant neoplasm of axillary tail of left female breast: Secondary | ICD-10-CM | POA: Diagnosis not present

## 2023-05-19 DIAGNOSIS — C50611 Malignant neoplasm of axillary tail of right female breast: Secondary | ICD-10-CM

## 2023-05-19 DIAGNOSIS — D499 Neoplasm of unspecified behavior of unspecified site: Secondary | ICD-10-CM | POA: Diagnosis not present

## 2023-05-19 DIAGNOSIS — Z79624 Long term (current) use of inhibitors of nucleotide synthesis: Secondary | ICD-10-CM | POA: Insufficient documentation

## 2023-05-19 DIAGNOSIS — N1831 Chronic kidney disease, stage 3a: Secondary | ICD-10-CM | POA: Insufficient documentation

## 2023-05-19 DIAGNOSIS — Z170 Estrogen receptor positive status [ER+]: Secondary | ICD-10-CM | POA: Insufficient documentation

## 2023-05-19 DIAGNOSIS — I7300 Raynaud's syndrome without gangrene: Secondary | ICD-10-CM

## 2023-05-19 DIAGNOSIS — Z79890 Hormone replacement therapy (postmenopausal): Secondary | ICD-10-CM | POA: Insufficient documentation

## 2023-05-19 DIAGNOSIS — Z79899 Other long term (current) drug therapy: Secondary | ICD-10-CM | POA: Diagnosis not present

## 2023-05-19 DIAGNOSIS — D649 Anemia, unspecified: Secondary | ICD-10-CM | POA: Insufficient documentation

## 2023-05-19 LAB — CMP (CANCER CENTER ONLY)
ALT: 12 U/L (ref 0–44)
AST: 25 U/L (ref 15–41)
Albumin: 4.2 g/dL (ref 3.5–5.0)
Alkaline Phosphatase: 56 U/L (ref 38–126)
Anion gap: 7 (ref 5–15)
BUN: 18 mg/dL (ref 8–23)
CO2: 27 mmol/L (ref 22–32)
Calcium: 9.2 mg/dL (ref 8.9–10.3)
Chloride: 103 mmol/L (ref 98–111)
Creatinine: 1.04 mg/dL — ABNORMAL HIGH (ref 0.44–1.00)
GFR, Estimated: 56 mL/min — ABNORMAL LOW (ref >60)
Glucose, Bld: 98 mg/dL (ref 70–99)
Potassium: 3.6 mmol/L (ref 3.5–5.1)
Sodium: 137 mmol/L (ref 135–145)
Total Bilirubin: 0.5 mg/dL (ref <1.2)
Total Protein: 7.2 g/dL (ref 6.5–8.1)

## 2023-05-19 LAB — CBC WITH DIFFERENTIAL (CANCER CENTER ONLY)
Abs Immature Granulocytes: 0.02 10*3/uL (ref 0.00–0.07)
Basophils Absolute: 0 10*3/uL (ref 0.0–0.1)
Basophils Relative: 1 %
Eosinophils Absolute: 0.1 10*3/uL (ref 0.0–0.5)
Eosinophils Relative: 2 %
HCT: 31.3 % — ABNORMAL LOW (ref 36.0–46.0)
Hemoglobin: 9.7 g/dL — ABNORMAL LOW (ref 12.0–15.0)
Immature Granulocytes: 0 %
Lymphocytes Relative: 12 %
Lymphs Abs: 0.7 10*3/uL (ref 0.7–4.0)
MCH: 29.2 pg (ref 26.0–34.0)
MCHC: 31 g/dL (ref 30.0–36.0)
MCV: 94.3 fL (ref 80.0–100.0)
Monocytes Absolute: 0.5 10*3/uL (ref 0.1–1.0)
Monocytes Relative: 9 %
Neutro Abs: 4 10*3/uL (ref 1.7–7.7)
Neutrophils Relative %: 76 %
Platelet Count: 245 10*3/uL (ref 150–400)
RBC: 3.32 MIL/uL — ABNORMAL LOW (ref 3.87–5.11)
RDW: 14.6 % (ref 11.5–15.5)
WBC Count: 5.3 10*3/uL (ref 4.0–10.5)
nRBC: 0 % (ref 0.0–0.2)

## 2023-05-19 LAB — TROPONIN I (HIGH SENSITIVITY): Troponin I (High Sensitivity): 9 ng/L (ref <18)

## 2023-05-19 LAB — CK: Total CK: 167 U/L (ref 38–234)

## 2023-05-19 LAB — SEDIMENTATION RATE: Sed Rate: 51 mm/h — ABNORMAL HIGH (ref 0–22)

## 2023-05-19 LAB — LACTATE DEHYDROGENASE: LDH: 199 U/L — ABNORMAL HIGH (ref 98–192)

## 2023-05-19 LAB — C-REACTIVE PROTEIN: CRP: 0.6 mg/dL (ref <1.0)

## 2023-05-19 MED ORDER — DIPHENHYDRAMINE HCL 25 MG PO CAPS
25.0000 mg | ORAL_CAPSULE | Freq: Once | ORAL | Status: DC
Start: 2023-05-19 — End: 2023-05-19

## 2023-05-19 MED ORDER — ACETAMINOPHEN 325 MG PO TABS
650.0000 mg | ORAL_TABLET | Freq: Once | ORAL | Status: DC
Start: 2023-05-19 — End: 2023-05-19

## 2023-05-19 MED ORDER — DEXTROSE 5 % IV SOLN: INTRAVENOUS | Status: DC

## 2023-05-19 MED ORDER — IMMUNE GLOBULIN (HUMAN) 20 GM/200ML IV SOLN
25.0000 g | Freq: Once | INTRAVENOUS | Status: AC
  Administered 2023-05-19: 25 g via INTRAVENOUS
  Filled 2023-05-19: qty 250

## 2023-05-19 MED ORDER — DARBEPOETIN ALFA 300 MCG/0.6ML IJ SOSY
300.0000 ug | PREFILLED_SYRINGE | Freq: Once | INTRAMUSCULAR | Status: AC
  Administered 2023-05-19: 300 ug via SUBCUTANEOUS
  Filled 2023-05-19: qty 0.6

## 2023-05-19 NOTE — Patient Instructions (Signed)

## 2023-05-19 NOTE — Patient Instructions (Signed)
Darbepoetin Alfa Injection What is this medication? DARBEPOETIN ALFA (dar be POE e tin AL fa) treats low levels of red blood cells (anemia) caused by kidney disease or chemotherapy. It works by Systems analyst make more red blood cells, which reduces the need for blood transfusions. This medicine may be used for other purposes; ask your health care provider or pharmacist if you have questions. COMMON BRAND NAME(S): Aranesp What should I tell my care team before I take this medication? They need to know if you have any of these conditions: Blood clots Cancer Heart disease High blood pressure On dialysis Seizures Stroke An unusual or allergic reaction to darbepoetin, latex, other medications, foods, dyes, or preservatives Pregnant or trying to get pregnant Breast-feeding How should I use this medication? This medication is injected into a vein or under the skin. It is usually given by a care team in a hospital or clinic setting. It may also be given at home. If you get this medication at home, you will be taught how to prepare and give it. Use exactly as directed. Take it as directed on the prescription label at the same time every day. Keep taking it unless your care team tells you to stop. It is important that you put your used needles and syringes in a special sharps container. Do not put them in a trash can. If you do not have a sharps container, call your pharmacist or care team to get one. A special MedGuide will be given to you by the pharmacist with each prescription and refill. Be sure to read this information carefully each time. Talk to your care team about the use of this medication in children. While this medication may be used in children as young as 1 month of age for selected conditions, precautions do apply. Overdosage: If you think you have taken too much of this medicine contact a poison control center or emergency room at once. NOTE: This medicine is only for you. Do not  share this medicine with others. What if I miss a dose? If you miss a dose, take it as soon as you can. If it is almost time for your next dose, take only that dose. Do not take double or extra doses. What may interact with this medication? Epoetin alfa Methoxy polyethylene glycol-epoetin beta This list may not describe all possible interactions. Give your health care provider a list of all the medicines, herbs, non-prescription drugs, or dietary supplements you use. Also tell them if you smoke, drink alcohol, or use illegal drugs. Some items may interact with your medicine. What should I watch for while using this medication? Visit your care team for regular checks on your progress. Check your blood pressure as directed. Know what your blood pressure should be and when to contact your care team. Your condition will be monitored carefully while you are receiving this medication. You may need blood work while taking this medication. What side effects may I notice from receiving this medication? Side effects that you should report to your care team as soon as possible: Allergic reactions--skin rash, itching, hives, swelling of the face, lips, tongue, or throat Blood clot--pain, swelling, or warmth in the leg, shortness of breath, chest pain Heart attack--pain or tightness in the chest, shoulders, arms, or jaw, nausea, shortness of breath, cold or clammy skin, feeling faint or lightheaded Increase in blood pressure Rash, fever, and swollen lymph nodes Redness, blistering, peeling, or loosening of the skin, including inside the mouth Seizures  Stroke--sudden numbness or weakness of the face, arm, or leg, trouble speaking, confusion, trouble walking, loss of balance or coordination, dizziness, severe headache, change in vision Side effects that usually do not require medical attention (report to your care team if they continue or are bothersome): Cough Stomach pain Swelling of the ankles, hands, or  feet This list may not describe all possible side effects. Call your doctor for medical advice about side effects. You may report side effects to FDA at 1-800-FDA-1088. Where should I keep my medication? Keep out of the reach of children and pets. Store in a refrigerator. Do not freeze. Do not shake. Protect from light. Keep this medication in the original container until you are ready to take it. See product for storage information. Get rid of any unused medication after the expiration date. To get rid of medications that are no longer needed or have expired: Take the medication to a medication take-back program. Check with your pharmacy or law enforcement to find a location. If you cannot return the medication, ask your pharmacist or care team how to get rid of the medication safely. NOTE: This sheet is a summary. It may not cover all possible information. If you have questions about this medicine, talk to your doctor, pharmacist, or health care provider.  2024 Elsevier/Gold Standard (2021-10-31 00:00:00) Immune Globulin Injection What is this medication? IMMUNE GLOBULIN (im MUNE  GLOB yoo lin) helps to prevent or reduce the severity of certain infections in patients who are at risk. This medicine is collected from the pooled blood of many donors. It is used to treat immune system problems, thrombocytopenia, and Kawasaki syndrome. This medicine may be used for other purposes; ask your health care provider or pharmacist if you have questions. This medicine may be used for other purposes; ask your health care provider or pharmacist if you have questions. COMMON BRAND NAME(S): ASCENIV, Baygam, BIVIGAM, Carimune, Carimune NF, cutaquig, Cuvitru, Flebogamma, Flebogamma DIF, GamaSTAN, GamaSTAN S/D, Gamimune N, Gammagard, Gammagard S/D, Gammaked, Gammaplex, Gammar-P IV, Gamunex, Gamunex-C, Hizentra, Iveegam, Iveegam EN, Octagam, Panglobulin, Panglobulin NF, panzyga, Polygam S/D, Privigen, Sandoglobulin,  Venoglobulin-S, Vigam, Vivaglobulin, Xembify What should I tell my care team before I take this medication? They need to know if you have any of these conditions: diabetes extremely low or no immune antibodies in the blood heart disease history of blood clots hyperprolinemia infection in the blood, sepsis kidney disease recently received or scheduled to receive a vaccination an unusual or allergic reaction to human immune globulin, albumin, maltose, sucrose, other medicines, foods, dyes, or preservatives pregnant or trying to get pregnant breast-feeding How should I use this medication? This medicine is for injection into a muscle or infusion into a vein or skin. It is usually given by a health care professional in a hospital or clinic setting. In rare cases, some brands of this medicine might be given at home. You will be taught how to give this medicine. Use exactly as directed. Take your medicine at regular intervals. Do not take your medicine more often than directed. Talk to your pediatrician regarding the use of this medicine in children. While this drug may be prescribed for selected conditions, precautions do apply. Overdosage: If you think you have taken too much of this medicine contact a poison control center or emergency room at once. NOTE: This medicine is only for you. Do not share this medicine with others. Overdosage: If you think you have taken too much of this medicine contact a poison control center  or emergency room at once. NOTE: This medicine is only for you. Do not share this medicine with others. What if I miss a dose? It is important not to miss your dose. Call your doctor or health care professional if you are unable to keep an appointment. If you give yourself the medicine and you miss a dose, take it as soon as you can. If it is almost time for your next dose, take only that dose. Do not take double or extra doses. What may interact with this medication? aspirin  and aspirin-like medicines cisplatin cyclosporine medicines for infection like acyclovir, adefovir, amphotericin B, bacitracin, cidofovir, foscarnet, ganciclovir, gentamicin, pentamidine, vancomycin NSAIDS, medicines for pain and inflammation, like ibuprofen or naproxen pamidronate vaccines zoledronic acid This list may not describe all possible interactions. Give your health care provider a list of all the medicines, herbs, non-prescription drugs, or dietary supplements you use. Also tell them if you smoke, drink alcohol, or use illegal drugs. Some items may interact with your medicine. This list may not describe all possible interactions. Give your health care provider a list of all the medicines, herbs, non-prescription drugs, or dietary supplements you use. Also tell them if you smoke, drink alcohol, or use illegal drugs. Some items may interact with your medicine. What should I watch for while using this medication? Your condition will be monitored carefully while you are receiving this medicine. This medicine is made from pooled blood donations of many different people. It may be possible to pass an infection in this medicine. However, the donors are screened for infections and all products are tested for HIV and hepatitis. The medicine is treated to kill most or all bacteria and viruses. Talk to your doctor about the risks and benefits of this medicine. Do not have vaccinations for at least 14 days before, or until at least 3 months after receiving this medicine. What side effects may I notice from receiving this medication? Side effects that you should report to your doctor or health care professional as soon as possible: allergic reactions like skin rash, itching or hives, swelling of the face, lips, or tongue blue colored lips or skin breathing problems chest pain or tightness fever signs and symptoms of aseptic meningitis such as stiff neck; sensitivity to light; headache; drowsiness;  fever; nausea; vomiting; rash signs and symptoms of a blood clot such as chest pain; shortness of breath; pain, swelling, or warmth in the leg signs and symptoms of hemolytic anemia such as fast heartbeat; tiredness; dark yellow or brown urine; or yellowing of the eyes or skin signs and symptoms of kidney injury like trouble passing urine or change in the amount of urine sudden weight gain swelling of the ankles, feet, hands Side effects that usually do not require medical attention (report to your doctor or health care professional if they continue or are bothersome): diarrhea flushing headache increased sweating joint pain muscle cramps muscle pain nausea pain, redness, or irritation at site where injected tiredness This list may not describe all possible side effects. Call your doctor for medical advice about side effects. You may report side effects to FDA at 1-800-FDA-1088. This list may not describe all possible side effects. Call your doctor for medical advice about side effects. You may report side effects to FDA at 1-800-FDA-1088. Where should I keep my medication? Keep out of the reach of children. This drug is usually given in a hospital or clinic and will not be stored at home. In rare cases, some  brands of this medicine may be given at home. If you are using this medicine at home, you will be instructed on how to store this medicine. Throw away any unused medicine after the expiration date on the label. NOTE: This sheet is a summary. It may not cover all possible information. If you have questions about this medicine, talk to your doctor, pharmacist, or health care provider.  2024 Elsevier/Gold Standard (2019-02-03 00:00:00)

## 2023-05-19 NOTE — Progress Notes (Signed)
Hematology and Oncology Follow Up Visit  [name] [id number] 01-Apr-1951 72 y.o. 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  So far, I think she is doing pretty well.  She tolerated the IVIG nicely.  I think this is helping with respect to her autoimmune diseases.  She is eating okay.  She is having no problems with nausea or vomiting.  There is no dysphagia or odynophagia.  Her renal function also seems to be doing well.  She has had no bleeding.  There is been no change in bowel or bladder habits.  She has had no shortness of breath.  She has had a cough which is nonproductive.  She seems to be doing pretty well.  She was seen by her Rheumatologist up in [place] last week.  She was very pleased with how well [name] was doing.  All of the test were positive for her improvement of her lung function.  She is swallowing better.  The IVIG certainly is making a difference.  She also is doing well on the Aranesp.  We just give this to her once a month.  She will have a dose today.  Of note, her CK is now down to 359 we last checked back in August.  The aldolase level was 8.0.  She has had a history of localized breast cancer.  We last checked her CA 27.29 it was 40.  When we last checked her iron studies, her ferritin was 152 with an iron saturation of 28%.  Overall, her performance status is probably ECOG 1.  Medications:  Current Outpatient Medications:    diphenhydrAMINE (BENADRYL) 25 mg capsule, Take 25 mg by mouth every 6 (six) hours as needed for allergies. Allergies and before IVIG treatment., Disp: , Rfl:    acetaminophen (TYLENOL) 650 MG CR  tablet, Take 650 mg by mouth daily as needed for pain., Disp: , Rfl:    ELDERBERRY PO, Take 1 oz by mouth 4 (four) times daily as needed. Liquid, Disp: , Rfl:    furosemide (LASIX) 20 MG tablet, Take 1 tablet (20 mg total) by mouth daily., Disp: 90 tablet, Rfl: 1   losartan (COZAAR) 25 MG tablet, Take 25 mg by mouth in the morning and at bedtime., Disp: , Rfl:    Multiple Vitamin (MULTIVITAMIN) capsule, Take 1 capsule by mouth daily., Disp: , Rfl:    mycophenolate (CELLCEPT) 500 MG tablet, Take 1,500 mg by mouth 2 (two) times daily., Disp: , Rfl:    pantoprazole (PROTONIX) 40 MG tablet, Take 40 mg by mouth 2 (two) times daily., Disp: , Rfl:    sodium chloride (OCEAN) 0.65 % SOLN nasal spray, Place 1 spray into both nostrils as needed for congestion., Disp: , Rfl:    SYNTHROID 125 MCG tablet, Take 1 tablet (125 mcg total) by mouth daily before breakfast. *Must be the brand only*, Disp: 90 tablet, Rfl: 1  Allergies:  Allergies  Allergen Reactions   Betadine [Povidone Iodine] Rash   Epinephrine Other (See Comments)    Severe headaches    Iodinated Contrast Media Other (See Comments) and Rash    "per pt: recently had CT 08/ 2018 even through given pre-medication,  IVP still caused rash"  "per pt recently had CT 08/ 2018 even through given pre-medication, IVP still caused rash"   Iodine Rash and Other (See Comments)   Oxycodone Anxiety   Prednisone Anaphylaxis    Per Nephrology pt should avoid this medication because she has Scleroderma and it will interfere with her Kidneys,    Gabapentin Other (See Comments)    Dizziness   Lidocaine Rash   Nickel Rash and Other (See Comments)    Rash and blisters   Other Other (See Comments) and Rash    Hospital gown   Penicillins Rash and Other (See Comments)    Rash and blisters Has patient had a PCN reaction causing immediate rash, facial/tongue/throat swelling, SOB or lightheadedness with hypotension: Yes Has patient had a PCN reaction causing  severe rash involving mucus membranes or skin necrosis: No Has patient had a PCN reaction that required hospitalization: No Has patient had a PCN reaction occurring within the last 10 years: No If all of the above answers are "NO", then may proceed with Cephalosporin use.    Sulfa Antibiotics Rash    Past Medical History, Surgical history, Social history, and Family History were reviewed and updated.  Review of Systems: Review of Systems  Constitutional:  Positive for fatigue.  HENT:  Negative.    Eyes: Negative.   Respiratory: Negative.    Cardiovascular: Negative.   Gastrointestinal: Negative.   Endocrine: Negative.   Genitourinary: Negative.    Musculoskeletal:  Positive for arthralgias and myalgias.  Skin:  Positive for rash.  Neurological: Negative.   Hematological: Negative.   Psychiatric/Behavioral: Negative.      Physical Exam: Temperature is 97.6.  Pulse 59.  Blood pressure 117/57.  Weight is 132 pounds.     Wt Readings from Last 3 Encounters:  05/19/23 132 lb (59.9 kg)  04/15/23 132 lb (59.9 kg)  04/14/23 131 lb (59.4 kg)    Physical Exam Vitals reviewed.  Constitutional:      Comments: Her breast exam shows right breast with no masses, edema or erythema.  There is no right axillary adenopathy.  Left breast shows the lumpectomy scar at about the 2 o'clock position.  There is little firmness at the lumpectomy site.  No distinct masses noted.  There is no left axillary adenopathy.  There is no left nipple discharge.  HENT:     Head: Normocephalic and atraumatic.  Eyes:     Pupils: Pupils are equal, round, and reactive to light.  Cardiovascular:     Rate and Rhythm: Normal rate and regular rhythm.     Heart sounds: Normal heart sounds.  Pulmonary:     Effort: Pulmonary effort is normal.     Breath sounds: Normal breath sounds.  Abdominal:     General: Bowel sounds are normal.     Palpations: Abdomen is soft.  Musculoskeletal:        General: No tenderness  or deformity. Normal range of motion.     Cervical back: Normal range of motion.  Lymphadenopathy:     Cervical: No cervical adenopathy.  Skin:    General: Skin is warm and dry.     Findings: No erythema or rash.     Comments: Skin exam shows some changes of the scleroderma.  This is mostly on the face, around the lip area.  Her arms do show a little bit of skin thickening.  Neurological:     Mental Status: She is alert and oriented to person, place, and  time.  Psychiatric:        Behavior: Behavior normal.        Thought Content: Thought content normal.        Judgment: Judgment normal.     Lab Results  Component Value Date   WBC 5.3 05/19/2023   HGB 9.7 (L) 05/19/2023   HCT 31.3 (L) 05/19/2023   MCV 94.3 05/19/2023   PLT 245 05/19/2023     Chemistry      Component Value Date/Time   NA 137 04/14/2023 0905   NA 144 01/27/2020 1148   NA 141 06/09/2017 1117   K 3.5 04/14/2023 0905   K 3.9 06/09/2017 1117   CL 103 04/14/2023 0905   CL 109 (H) 12/14/2012 1253   CO2 26 04/14/2023 0905   CO2 23 06/09/2017 1117   BUN 17 04/14/2023 0905   BUN 11 01/27/2020 1148   BUN 11.5 06/09/2017 1117   CREATININE 1.09 (H) 04/14/2023 0905   CREATININE 1.2 (H) 06/09/2017 1117      Component Value Date/Time   CALCIUM 8.8 (L) 04/14/2023 0905   CALCIUM 8.9 06/09/2017 1117   ALKPHOS 49 04/14/2023 0905   ALKPHOS 40 06/09/2017 1117   AST 27 04/14/2023 0905   AST 35 (H) 06/09/2017 1117   ALT 13 04/14/2023 0905   ALT 19 06/09/2017 1117   BILITOT 0.5 04/14/2023 0905   BILITOT 0.37 06/09/2017 1117      Impression and Plan: [name] is a very charming 72 year old white female.  She has had a history of early stage breast cancer.  So far, we have not found any problems with respect to recurrent disease.  We always have to check her CA 27.29.  I am glad to see that the IVIG is helping her scleroderma.  Overall, I really think that her quality of life is doing much better which is nice  to see.  For right now, we will just continue on the IVIG.  She gets this monthly.  Maybe, we will be able to move her appointments back a little bit longer.      Josph Macho, MD 11/4/202410:13 AM

## 2023-05-19 NOTE — Telephone Encounter (Signed)
Attached message given to pt during today's infusion:   "the troponin level is now back to normal at  9."   Patient verbalizes understanding and appreciation. dph

## 2023-05-19 NOTE — Telephone Encounter (Addendum)
Attached message given to pt during today's infusion appt/ dph ----- Message from Josph Macho sent at 05/19/2023  1:10 PM EST ----- Please call and let her know that the CK level is now down to 167.  This is fantastic news.  Cindee Lame

## 2023-05-20 ENCOUNTER — Inpatient Hospital Stay: Payer: Medicare Other

## 2023-05-20 VITALS — BP 122/65 | HR 80 | Temp 97.8°F | Resp 16

## 2023-05-20 DIAGNOSIS — M349 Systemic sclerosis, unspecified: Secondary | ICD-10-CM | POA: Diagnosis not present

## 2023-05-20 DIAGNOSIS — Z170 Estrogen receptor positive status [ER+]: Secondary | ICD-10-CM | POA: Diagnosis not present

## 2023-05-20 DIAGNOSIS — D631 Anemia in chronic kidney disease: Secondary | ICD-10-CM | POA: Diagnosis not present

## 2023-05-20 DIAGNOSIS — D509 Iron deficiency anemia, unspecified: Secondary | ICD-10-CM

## 2023-05-20 DIAGNOSIS — D649 Anemia, unspecified: Secondary | ICD-10-CM | POA: Diagnosis not present

## 2023-05-20 DIAGNOSIS — C50912 Malignant neoplasm of unspecified site of left female breast: Secondary | ICD-10-CM | POA: Diagnosis not present

## 2023-05-20 DIAGNOSIS — Z79899 Other long term (current) drug therapy: Secondary | ICD-10-CM | POA: Diagnosis not present

## 2023-05-20 DIAGNOSIS — Z923 Personal history of irradiation: Secondary | ICD-10-CM | POA: Diagnosis not present

## 2023-05-20 LAB — ALDOLASE: Aldolase: 5.6 U/L (ref 3.3–10.3)

## 2023-05-20 LAB — CANCER ANTIGEN 27.29: CA 27.29: 35.8 U/mL (ref 0.0–38.6)

## 2023-05-20 MED ORDER — HEPARIN SOD (PORK) LOCK FLUSH 100 UNIT/ML IV SOLN
500.0000 [IU] | Freq: Once | INTRAVENOUS | Status: AC
  Administered 2023-05-20: 500 [IU] via INTRAVENOUS

## 2023-05-20 MED ORDER — ACETAMINOPHEN 325 MG PO TABS: 650.0000 mg | ORAL_TABLET | Freq: Once | ORAL | Status: DC

## 2023-05-20 MED ORDER — IMMUNE GLOBULIN (HUMAN) 20 GM/200ML IV SOLN
25.0000 g | Freq: Once | INTRAVENOUS | Status: AC
  Administered 2023-05-20: 25 g via INTRAVENOUS
  Filled 2023-05-20: qty 250

## 2023-05-20 MED ORDER — DIPHENHYDRAMINE HCL 25 MG PO CAPS: 25.0000 mg | ORAL_CAPSULE | Freq: Once | ORAL | Status: DC

## 2023-05-20 MED ORDER — DEXTROSE 5 % IV SOLN: INTRAVENOUS | Status: DC

## 2023-05-20 MED ORDER — SODIUM CHLORIDE 0.9% FLUSH
10.0000 mL | Freq: Once | INTRAVENOUS | Status: AC
  Administered 2023-05-20: 10 mL via INTRAVENOUS

## 2023-05-20 NOTE — Patient Instructions (Signed)

## 2023-05-21 ENCOUNTER — Inpatient Hospital Stay: Payer: Medicare Other

## 2023-05-21 VITALS — BP 129/60 | HR 68 | Temp 99.1°F | Resp 16

## 2023-05-21 DIAGNOSIS — D509 Iron deficiency anemia, unspecified: Secondary | ICD-10-CM

## 2023-05-21 DIAGNOSIS — Z170 Estrogen receptor positive status [ER+]: Secondary | ICD-10-CM | POA: Diagnosis not present

## 2023-05-21 DIAGNOSIS — D631 Anemia in chronic kidney disease: Secondary | ICD-10-CM | POA: Diagnosis not present

## 2023-05-21 DIAGNOSIS — D649 Anemia, unspecified: Secondary | ICD-10-CM | POA: Diagnosis not present

## 2023-05-21 DIAGNOSIS — M349 Systemic sclerosis, unspecified: Secondary | ICD-10-CM | POA: Diagnosis not present

## 2023-05-21 DIAGNOSIS — C50612 Malignant neoplasm of axillary tail of left female breast: Secondary | ICD-10-CM

## 2023-05-21 DIAGNOSIS — Z79899 Other long term (current) drug therapy: Secondary | ICD-10-CM | POA: Diagnosis not present

## 2023-05-21 DIAGNOSIS — Z923 Personal history of irradiation: Secondary | ICD-10-CM | POA: Diagnosis not present

## 2023-05-21 DIAGNOSIS — C50912 Malignant neoplasm of unspecified site of left female breast: Secondary | ICD-10-CM | POA: Diagnosis not present

## 2023-05-21 MED ORDER — DEXTROSE 5 % IV SOLN: INTRAVENOUS | Status: AC

## 2023-05-21 MED ORDER — HEPARIN SOD (PORK) LOCK FLUSH 100 UNIT/ML IV SOLN
500.0000 [IU] | Freq: Once | INTRAVENOUS | Status: AC
Start: 2023-05-21 — End: 2023-05-21
  Administered 2023-05-21: 500 [IU] via INTRAVENOUS

## 2023-05-21 MED ORDER — ACETAMINOPHEN 325 MG PO TABS: 650.0000 mg | ORAL_TABLET | Freq: Once | ORAL | Status: DC

## 2023-05-21 MED ORDER — IMMUNE GLOBULIN (HUMAN) 20 GM/200ML IV SOLN
25.0000 g | Freq: Once | INTRAVENOUS | Status: AC
  Administered 2023-05-21: 25 g via INTRAVENOUS
  Filled 2023-05-21: qty 250

## 2023-05-21 MED ORDER — DIPHENHYDRAMINE HCL 25 MG PO CAPS: 25.0000 mg | ORAL_CAPSULE | Freq: Once | ORAL | Status: DC

## 2023-05-21 MED ORDER — SODIUM CHLORIDE 0.9% FLUSH
10.0000 mL | Freq: Once | INTRAVENOUS | Status: AC
  Administered 2023-05-21: 10 mL via INTRAVENOUS

## 2023-05-22 ENCOUNTER — Inpatient Hospital Stay: Payer: Medicare Other

## 2023-05-22 ENCOUNTER — Other Ambulatory Visit: Payer: Self-pay

## 2023-05-22 VITALS — BP 118/59 | HR 80 | Temp 98.2°F | Resp 18

## 2023-05-22 DIAGNOSIS — D631 Anemia in chronic kidney disease: Secondary | ICD-10-CM | POA: Diagnosis not present

## 2023-05-22 DIAGNOSIS — D509 Iron deficiency anemia, unspecified: Secondary | ICD-10-CM

## 2023-05-22 DIAGNOSIS — D649 Anemia, unspecified: Secondary | ICD-10-CM | POA: Diagnosis not present

## 2023-05-22 DIAGNOSIS — E038 Other specified hypothyroidism: Secondary | ICD-10-CM

## 2023-05-22 DIAGNOSIS — M349 Systemic sclerosis, unspecified: Secondary | ICD-10-CM | POA: Diagnosis not present

## 2023-05-22 DIAGNOSIS — E039 Hypothyroidism, unspecified: Secondary | ICD-10-CM

## 2023-05-22 DIAGNOSIS — Z79899 Other long term (current) drug therapy: Secondary | ICD-10-CM | POA: Diagnosis not present

## 2023-05-22 DIAGNOSIS — Z170 Estrogen receptor positive status [ER+]: Secondary | ICD-10-CM | POA: Diagnosis not present

## 2023-05-22 DIAGNOSIS — Z923 Personal history of irradiation: Secondary | ICD-10-CM | POA: Diagnosis not present

## 2023-05-22 DIAGNOSIS — C50912 Malignant neoplasm of unspecified site of left female breast: Secondary | ICD-10-CM | POA: Diagnosis not present

## 2023-05-22 LAB — TSH: TSH: 0.095 u[IU]/mL — ABNORMAL LOW (ref 0.350–4.500)

## 2023-05-22 MED ORDER — DIPHENHYDRAMINE HCL 25 MG PO CAPS
25.0000 mg | ORAL_CAPSULE | Freq: Once | ORAL | Status: DC
Start: 2023-05-22 — End: 2023-05-22

## 2023-05-22 MED ORDER — ACETAMINOPHEN 325 MG PO TABS: 650.0000 mg | ORAL_TABLET | Freq: Once | ORAL | Status: DC

## 2023-05-22 MED ORDER — IMMUNE GLOBULIN (HUMAN) 20 GM/200ML IV SOLN
25.0000 g | Freq: Once | INTRAVENOUS | Status: AC
  Administered 2023-05-22: 25 g via INTRAVENOUS
  Filled 2023-05-22: qty 50

## 2023-05-22 MED ORDER — DEXTROSE 5 % IV SOLN: INTRAVENOUS | Status: DC

## 2023-05-22 NOTE — Progress Notes (Signed)
Patient took APAP and Ben at home prior to clinic.  Anola Gurney [place], [place], BCPS, BCOP 05/22/2023 11:42 AM

## 2023-05-22 NOTE — Patient Instructions (Signed)

## 2023-05-23 ENCOUNTER — Encounter: Payer: Self-pay | Admitting: Family Medicine

## 2023-05-23 ENCOUNTER — Other Ambulatory Visit: Payer: Self-pay | Admitting: *Deleted

## 2023-05-23 ENCOUNTER — Inpatient Hospital Stay: Payer: Medicare Other

## 2023-05-23 ENCOUNTER — Telehealth: Payer: Self-pay | Admitting: *Deleted

## 2023-05-23 VITALS — BP 122/80 | HR 79 | Temp 97.1°F | Resp 18

## 2023-05-23 DIAGNOSIS — D509 Iron deficiency anemia, unspecified: Secondary | ICD-10-CM

## 2023-05-23 DIAGNOSIS — I158 Other secondary hypertension: Secondary | ICD-10-CM

## 2023-05-23 DIAGNOSIS — Z923 Personal history of irradiation: Secondary | ICD-10-CM | POA: Diagnosis not present

## 2023-05-23 DIAGNOSIS — E039 Hypothyroidism, unspecified: Secondary | ICD-10-CM

## 2023-05-23 DIAGNOSIS — C50912 Malignant neoplasm of unspecified site of left female breast: Secondary | ICD-10-CM | POA: Diagnosis not present

## 2023-05-23 DIAGNOSIS — D631 Anemia in chronic kidney disease: Secondary | ICD-10-CM | POA: Diagnosis not present

## 2023-05-23 DIAGNOSIS — Z79899 Other long term (current) drug therapy: Secondary | ICD-10-CM | POA: Diagnosis not present

## 2023-05-23 DIAGNOSIS — M349 Systemic sclerosis, unspecified: Secondary | ICD-10-CM | POA: Diagnosis not present

## 2023-05-23 DIAGNOSIS — D649 Anemia, unspecified: Secondary | ICD-10-CM | POA: Diagnosis not present

## 2023-05-23 DIAGNOSIS — Z170 Estrogen receptor positive status [ER+]: Secondary | ICD-10-CM | POA: Diagnosis not present

## 2023-05-23 MED ORDER — HEPARIN SOD (PORK) LOCK FLUSH 100 UNIT/ML IV SOLN
500.0000 [IU] | Freq: Once | INTRAVENOUS | Status: AC
Start: 2023-05-23 — End: 2023-05-23
  Administered 2023-05-23: 500 [IU] via INTRAVENOUS

## 2023-05-23 MED ORDER — DIPHENHYDRAMINE HCL 25 MG PO CAPS: 25.0000 mg | ORAL_CAPSULE | Freq: Once | ORAL | Status: DC

## 2023-05-23 MED ORDER — LEVOTHYROXINE SODIUM 88 MCG PO TABS: 88.0000 ug | ORAL_TABLET | Freq: Every day | ORAL | 1 refills | Status: DC

## 2023-05-23 MED ORDER — IMMUNE GLOBULIN (HUMAN) 20 GM/200ML IV SOLN
25.0000 g | Freq: Once | INTRAVENOUS | Status: AC
  Administered 2023-05-23: 25 g via INTRAVENOUS
  Filled 2023-05-23: qty 200

## 2023-05-23 MED ORDER — LEVOTHYROXINE SODIUM 100 MCG PO TABS: 100.0000 ug | ORAL_TABLET | Freq: Every day | ORAL | 2 refills | Status: DC

## 2023-05-23 MED ORDER — HEPARIN SOD (PORK) LOCK FLUSH 100 UNIT/ML IV SOLN: 500.0000 [IU] | Freq: Once | INTRAVENOUS | Status: DC

## 2023-05-23 MED ORDER — ACETAMINOPHEN 325 MG PO TABS: 650.0000 mg | ORAL_TABLET | Freq: Once | ORAL | Status: DC

## 2023-05-23 MED ORDER — DEXTROSE 5 % IV SOLN: INTRAVENOUS | Status: DC

## 2023-05-23 MED ORDER — SODIUM CHLORIDE 0.9% FLUSH
10.0000 mL | Freq: Once | INTRAVENOUS | Status: AC
Start: 2023-05-23 — End: 2023-05-23
  Administered 2023-05-23: 10 mL

## 2023-05-23 NOTE — Telephone Encounter (Addendum)
-----   Message from Josph Macho sent at 05/22/2023  4:32 PM EST ----- Patient in infusion room.  Dr Myna Hidalgo wanted patient to know that the Synthroid dose might be a little bit too much.  We need to decrease her Synthroid down to 100 mcg a day.  Prescription sent to [facility] and dr Patsy Lager notified per patient request

## 2023-05-23 NOTE — Patient Instructions (Signed)
[facility] - A DEPT OF [facility]  Discharge Instructions: Thank you for choosing [facility] to provide your oncology and hematology care.   If you have a lab appointment with the Cancer Center, please go directly to the Cancer Center and check in at the registration area.  Wear comfortable clothing and clothing appropriate for easy access to any Portacath or PICC line.   We strive to give you quality time with your provider. You may need to reschedule your appointment if you arrive late (15 or more minutes).  Arriving late affects you and other patients whose appointments are after yours.  Also, if you miss three or more appointments without notifying the office, you may be dismissed from the clinic at the provider's discretion.      For prescription refill requests, have your pharmacy contact our office and allow 72 hours for refills to be completed.    Today you received the following IVIG.       To help prevent nausea and vomiting after your treatment, we encourage you to take your nausea medication as directed.  BELOW ARE SYMPTOMS THAT SHOULD BE REPORTED IMMEDIATELY: *FEVER GREATER THAN 100.4 F (38 C) OR HIGHER *CHILLS OR SWEATING *NAUSEA AND VOMITING THAT IS NOT CONTROLLED WITH YOUR NAUSEA MEDICATION *UNUSUAL SHORTNESS OF BREATH *UNUSUAL BRUISING OR BLEEDING *URINARY PROBLEMS (pain or burning when urinating, or frequent urination) *BOWEL PROBLEMS (unusual diarrhea, constipation, pain near the anus) TENDERNESS IN MOUTH AND THROAT WITH OR WITHOUT PRESENCE OF ULCERS (sore throat, sores in mouth, or a toothache) UNUSUAL RASH, SWELLING OR PAIN  UNUSUAL VAGINAL DISCHARGE OR ITCHING   Items with * indicate a potential emergency and should be followed up as soon as possible or go to the Emergency Department if any problems should occur.  Please show the CHEMOTHERAPY ALERT CARD or IMMUNOTHERAPY ALERT CARD at check-in to the Emergency  Department and triage nurse. Should you have questions after your visit or need to cancel or reschedule your appointment, please contact [facility] - A DEPT OF [facility]  [phone] and follow the prompts.  Office hours are 8:00 a.m. to 4:30 p.m. Monday - Friday. Please note that voicemails left after 4:00 p.m. may not be returned until the following business day.  We are closed weekends and major holidays. You have access to a nurse at all times for urgent questions. Please call the main number to the clinic [phone] and follow the prompts.  For any non-urgent questions, you may also contact your provider using MyChart. We now offer e-Visits for anyone 71 and older to request care online for non-urgent symptoms. For details visit [email or web address].   Also download the MyChart app! Go to the app store, search "MyChart", open the app, select [facility], and log in with your MyChart username and password.

## 2023-05-23 NOTE — Progress Notes (Signed)
Pt. arrived emotional with home stress.Settled without further distress. TSH low, dosing changed per Dr. Myna Hidalgo. Dr. Patsy Lager also notified.

## 2023-05-24 MED ORDER — LEVOTHYROXINE SODIUM 100 MCG PO TABS: 100.0000 ug | ORAL_TABLET | Freq: Every day | ORAL | 3 refills | Status: DC

## 2023-05-24 NOTE — Addendum Note (Signed)
Addended by: Abbe Amsterdam C on: 05/24/2023 03:38 PM   Modules accepted: Orders

## 2023-05-26 ENCOUNTER — Ambulatory Visit: Payer: Medicare Other

## 2023-05-27 ENCOUNTER — Ambulatory Visit: Payer: Medicare Other

## 2023-05-27 DIAGNOSIS — R29898 Other symptoms and signs involving the musculoskeletal system: Secondary | ICD-10-CM | POA: Diagnosis not present

## 2023-05-27 DIAGNOSIS — M79645 Pain in left finger(s): Secondary | ICD-10-CM | POA: Diagnosis not present

## 2023-05-27 DIAGNOSIS — R208 Other disturbances of skin sensation: Secondary | ICD-10-CM | POA: Diagnosis not present

## 2023-05-27 DIAGNOSIS — M79644 Pain in right finger(s): Secondary | ICD-10-CM | POA: Diagnosis not present

## 2023-05-27 DIAGNOSIS — M79641 Pain in right hand: Secondary | ICD-10-CM | POA: Diagnosis not present

## 2023-05-27 DIAGNOSIS — M25641 Stiffness of right hand, not elsewhere classified: Secondary | ICD-10-CM | POA: Diagnosis not present

## 2023-05-27 DIAGNOSIS — M349 Systemic sclerosis, unspecified: Secondary | ICD-10-CM | POA: Diagnosis not present

## 2023-05-27 DIAGNOSIS — M25642 Stiffness of left hand, not elsewhere classified: Secondary | ICD-10-CM | POA: Diagnosis not present

## 2023-05-27 DIAGNOSIS — M3489 Other systemic sclerosis: Secondary | ICD-10-CM | POA: Diagnosis not present

## 2023-05-27 DIAGNOSIS — R600 Localized edema: Secondary | ICD-10-CM | POA: Diagnosis not present

## 2023-05-28 ENCOUNTER — Ambulatory Visit: Payer: Medicare Other

## 2023-06-04 DIAGNOSIS — R2681 Unsteadiness on feet: Secondary | ICD-10-CM | POA: Diagnosis not present

## 2023-06-04 DIAGNOSIS — M6281 Muscle weakness (generalized): Secondary | ICD-10-CM | POA: Diagnosis not present

## 2023-06-04 DIAGNOSIS — M25551 Pain in right hip: Secondary | ICD-10-CM | POA: Diagnosis not present

## 2023-06-04 DIAGNOSIS — M62838 Other muscle spasm: Secondary | ICD-10-CM | POA: Diagnosis not present

## 2023-06-05 DIAGNOSIS — I7300 Raynaud's syndrome without gangrene: Secondary | ICD-10-CM | POA: Diagnosis not present

## 2023-06-05 DIAGNOSIS — I2721 Secondary pulmonary arterial hypertension: Secondary | ICD-10-CM | POA: Diagnosis not present

## 2023-06-05 DIAGNOSIS — J849 Interstitial pulmonary disease, unspecified: Secondary | ICD-10-CM | POA: Diagnosis not present

## 2023-06-05 DIAGNOSIS — M349 Systemic sclerosis, unspecified: Secondary | ICD-10-CM | POA: Diagnosis not present

## 2023-06-16 ENCOUNTER — Encounter: Payer: Self-pay | Admitting: Hematology & Oncology

## 2023-06-17 ENCOUNTER — Other Ambulatory Visit: Payer: Self-pay | Admitting: *Deleted

## 2023-06-17 DIAGNOSIS — M349 Systemic sclerosis, unspecified: Secondary | ICD-10-CM

## 2023-06-20 ENCOUNTER — Inpatient Hospital Stay: Payer: Medicare Other

## 2023-06-20 ENCOUNTER — Encounter: Payer: Self-pay | Admitting: *Deleted

## 2023-06-20 ENCOUNTER — Inpatient Hospital Stay (HOSPITAL_BASED_OUTPATIENT_CLINIC_OR_DEPARTMENT_OTHER): Payer: Medicare Other | Admitting: Hematology & Oncology

## 2023-06-20 ENCOUNTER — Inpatient Hospital Stay: Payer: Medicare Other | Attending: Hematology & Oncology

## 2023-06-20 ENCOUNTER — Encounter: Payer: Self-pay | Admitting: Hematology & Oncology

## 2023-06-20 VITALS — BP 105/54 | HR 77 | Temp 97.9°F | Resp 16

## 2023-06-20 VITALS — BP 128/67 | HR 81 | Temp 97.6°F | Resp 18 | Ht 64.0 in | Wt 132.6 lb

## 2023-06-20 DIAGNOSIS — I7300 Raynaud's syndrome without gangrene: Secondary | ICD-10-CM

## 2023-06-20 DIAGNOSIS — Z853 Personal history of malignant neoplasm of breast: Secondary | ICD-10-CM | POA: Diagnosis not present

## 2023-06-20 DIAGNOSIS — Z79899 Other long term (current) drug therapy: Secondary | ICD-10-CM | POA: Diagnosis not present

## 2023-06-20 DIAGNOSIS — E038 Other specified hypothyroidism: Secondary | ICD-10-CM

## 2023-06-20 DIAGNOSIS — M349 Systemic sclerosis, unspecified: Secondary | ICD-10-CM | POA: Diagnosis not present

## 2023-06-20 DIAGNOSIS — D499 Neoplasm of unspecified behavior of unspecified site: Secondary | ICD-10-CM

## 2023-06-20 DIAGNOSIS — Z79890 Hormone replacement therapy (postmenopausal): Secondary | ICD-10-CM | POA: Insufficient documentation

## 2023-06-20 DIAGNOSIS — G130 Paraneoplastic neuromyopathy and neuropathy: Secondary | ICD-10-CM | POA: Diagnosis not present

## 2023-06-20 DIAGNOSIS — C50412 Malignant neoplasm of upper-outer quadrant of left female breast: Secondary | ICD-10-CM | POA: Diagnosis not present

## 2023-06-20 DIAGNOSIS — C50612 Malignant neoplasm of axillary tail of left female breast: Secondary | ICD-10-CM

## 2023-06-20 DIAGNOSIS — Z79624 Long term (current) use of inhibitors of nucleotide synthesis: Secondary | ICD-10-CM | POA: Diagnosis not present

## 2023-06-20 DIAGNOSIS — D500 Iron deficiency anemia secondary to blood loss (chronic): Secondary | ICD-10-CM

## 2023-06-20 DIAGNOSIS — D631 Anemia in chronic kidney disease: Secondary | ICD-10-CM | POA: Insufficient documentation

## 2023-06-20 DIAGNOSIS — D509 Iron deficiency anemia, unspecified: Secondary | ICD-10-CM

## 2023-06-20 DIAGNOSIS — Z923 Personal history of irradiation: Secondary | ICD-10-CM | POA: Insufficient documentation

## 2023-06-20 DIAGNOSIS — Z170 Estrogen receptor positive status [ER+]: Secondary | ICD-10-CM | POA: Diagnosis not present

## 2023-06-20 DIAGNOSIS — R059 Cough, unspecified: Secondary | ICD-10-CM | POA: Insufficient documentation

## 2023-06-20 DIAGNOSIS — N1831 Chronic kidney disease, stage 3a: Secondary | ICD-10-CM | POA: Insufficient documentation

## 2023-06-20 LAB — CMP (CANCER CENTER ONLY)
ALT: 14 U/L (ref 0–44)
AST: 25 U/L (ref 15–41)
Albumin: 3.4 g/dL — ABNORMAL LOW (ref 3.5–5.0)
Alkaline Phosphatase: 55 U/L (ref 38–126)
Anion gap: 8 (ref 5–15)
BUN: 19 mg/dL (ref 8–23)
CO2: 22 mmol/L (ref 22–32)
Calcium: 8.7 mg/dL — ABNORMAL LOW (ref 8.9–10.3)
Chloride: 102 mmol/L (ref 98–111)
Creatinine: 1.15 mg/dL — ABNORMAL HIGH (ref 0.44–1.00)
GFR, Estimated: 50 mL/min — ABNORMAL LOW (ref >60)
Glucose, Bld: 102 mg/dL — ABNORMAL HIGH (ref 70–99)
Potassium: 3.4 mmol/L — ABNORMAL LOW (ref 3.5–5.1)
Sodium: 132 mmol/L — ABNORMAL LOW (ref 135–145)
Total Bilirubin: 0.4 mg/dL (ref <1.2)
Total Protein: 6.7 g/dL (ref 6.5–8.1)

## 2023-06-20 LAB — CBC WITH DIFFERENTIAL (CANCER CENTER ONLY)
Abs Immature Granulocytes: 0.03 10*3/uL (ref 0.00–0.07)
Basophils Absolute: 0 10*3/uL (ref 0.0–0.1)
Basophils Relative: 0 %
Eosinophils Absolute: 0.1 10*3/uL (ref 0.0–0.5)
Eosinophils Relative: 2 %
HCT: 30 % — ABNORMAL LOW (ref 36.0–46.0)
Hemoglobin: 9.7 g/dL — ABNORMAL LOW (ref 12.0–15.0)
Immature Granulocytes: 0 %
Lymphocytes Relative: 8 %
Lymphs Abs: 0.6 10*3/uL — ABNORMAL LOW (ref 0.7–4.0)
MCH: 30.5 pg (ref 26.0–34.0)
MCHC: 32.3 g/dL (ref 30.0–36.0)
MCV: 94.3 fL (ref 80.0–100.0)
Monocytes Absolute: 0.5 10*3/uL (ref 0.1–1.0)
Monocytes Relative: 6 %
Neutro Abs: 6.3 10*3/uL (ref 1.7–7.7)
Neutrophils Relative %: 84 %
Platelet Count: 247 10*3/uL (ref 150–400)
RBC: 3.18 MIL/uL — ABNORMAL LOW (ref 3.87–5.11)
RDW: 14.2 % (ref 11.5–15.5)
WBC Count: 7.6 10*3/uL (ref 4.0–10.5)
nRBC: 0 % (ref 0.0–0.2)

## 2023-06-20 LAB — FERRITIN: Ferritin: 97 ng/mL (ref 11–307)

## 2023-06-20 LAB — SEDIMENTATION RATE: Sed Rate: 60 mm/h — ABNORMAL HIGH (ref 0–22)

## 2023-06-20 LAB — RETICULOCYTES
Immature Retic Fract: 7.8 % (ref 2.3–15.9)
RBC.: 3.08 MIL/uL — ABNORMAL LOW (ref 3.87–5.11)
Retic Count, Absolute: 49.6 10*3/uL (ref 19.0–186.0)
Retic Ct Pct: 1.6 % (ref 0.4–3.1)

## 2023-06-20 LAB — C-REACTIVE PROTEIN: CRP: 0.6 mg/dL (ref <1.0)

## 2023-06-20 LAB — TSH: TSH: 0.654 u[IU]/mL (ref 0.350–4.500)

## 2023-06-20 LAB — IRON AND IRON BINDING CAPACITY (CC-WL,HP ONLY)
Iron: 75 ug/dL (ref 28–170)
Saturation Ratios: 27 % (ref 10.4–31.8)
TIBC: 280 ug/dL (ref 250–450)
UIBC: 205 ug/dL (ref 148–442)

## 2023-06-20 LAB — CK: Total CK: 102 U/L (ref 38–234)

## 2023-06-20 LAB — TROPONIN I (HIGH SENSITIVITY): Troponin I (High Sensitivity): 7 ng/L (ref <18)

## 2023-06-20 LAB — LACTATE DEHYDROGENASE: LDH: 172 U/L (ref 98–192)

## 2023-06-20 MED ORDER — DARBEPOETIN ALFA 300 MCG/0.6ML IJ SOSY
300.0000 ug | PREFILLED_SYRINGE | Freq: Once | INTRAMUSCULAR | Status: AC
Start: 2023-06-20 — End: 2023-06-20
  Administered 2023-06-20: 300 ug via SUBCUTANEOUS
  Filled 2023-06-20: qty 0.6

## 2023-06-20 MED ORDER — ACETAMINOPHEN 325 MG PO TABS: 650.0000 mg | ORAL_TABLET | Freq: Once | ORAL | Status: DC

## 2023-06-20 MED ORDER — DEXTROSE 5 % IV SOLN
INTRAVENOUS | Status: DC
Start: 2023-06-20 — End: 2023-06-20

## 2023-06-20 MED ORDER — DIPHENHYDRAMINE HCL 25 MG PO CAPS: 25.0000 mg | ORAL_CAPSULE | Freq: Once | ORAL | Status: DC

## 2023-06-20 MED ORDER — IMMUNE GLOBULIN (HUMAN) 20 GM/200ML IV SOLN
25.0000 g | Freq: Once | INTRAVENOUS | Status: AC
  Administered 2023-06-20: 25 g via INTRAVENOUS
  Filled 2023-06-20: qty 250

## 2023-06-20 NOTE — Progress Notes (Signed)
Hematology and Oncology Follow Up Visit  [name] [id number] 07-May-1951 72 y.o. 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  She is managing okay.  She still does have some swallowing issues.  I think she sees Gastroenterology next week.  She is done well with the IVIG.  I know that the rheumatologist wants to spread her treatments out a little bit more.  We can certainly do this.  She has had no problems with nausea or vomiting.  She does have a cough.  She seems to cough when she eats.  I do not know if she has any kind of aspiration..  She has had no fever.  There is been no bleeding.  She has had no change in bowel or bladder habits.  She has had no leg swelling.  Her creatinine kinase has been coming down nicely.  When we last saw her, the creatinine kinase was down to 167.  The aldolase was 5.6.  She does get Aranesp for her anemia.  She will definitely need a dose today.  Of note, her last CA 27.29 was down to 36.  Currently, I would have said that her performance status is probably ECOG 1.    Medications:  Current Outpatient Medications:    acetaminophen (TYLENOL) 650 MG CR tablet, Take 650 mg by mouth daily as needed for pain., Disp: , Rfl:    diphenhydrAMINE (BENADRYL) 25 mg capsule, Take 25 mg by mouth every 6 (six) hours as needed for allergies. Allergies and before IVIG treatment., Disp: , Rfl:    ELDERBERRY PO, Take 1 oz by mouth 4 (four) times daily as needed. Liquid, Disp: , Rfl:    furosemide (LASIX) 20 MG tablet, Take 1 tablet (20 mg total) by mouth daily., Disp: 90 tablet, Rfl: 1   levothyroxine (SYNTHROID) 100 MCG tablet, Take 1 tablet (100  mcg total) by mouth daily before breakfast., Disp: 30 tablet, Rfl: 2   losartan (COZAAR) 25 MG tablet, Take 25 mg by mouth in the morning and at bedtime., Disp: , Rfl:    Multiple Vitamin (MULTIVITAMIN) capsule, Take 1 capsule by mouth daily., Disp: , Rfl:    mycophenolate (CELLCEPT) 500 MG tablet, Take 1,500 mg by mouth 2 (two) times daily., Disp: , Rfl:    pantoprazole (PROTONIX) 40 MG tablet, Take 40 mg by mouth 2 (two) times daily., Disp: , Rfl:    sodium chloride (OCEAN) 0.65 % SOLN nasal spray, Place 1 spray into both nostrils as needed for congestion., Disp: , Rfl:   Allergies:  Allergies  Allergen Reactions   Betadine [Povidone Iodine] Rash   Epinephrine Other (See Comments)    Severe headaches    Iodinated Contrast Media Other (See Comments) and Rash    "per pt: recently had CT 08/ 2018 even through given pre-medication, IVP still caused rash"  "per pt recently had CT 08/ 2018 even through given pre-medication, IVP still caused rash"   Iodine Rash and Other (See Comments)   Oxycodone Anxiety   Prednisone Anaphylaxis    Per Nephrology pt should avoid this medication because she has Scleroderma and it will interfere with her Kidneys,    Gabapentin Other (See Comments)  Dizziness   Lidocaine Rash   Nickel Rash and Other (See Comments)    Rash and blisters   Other Other (See Comments) and Rash    Hospital gown   Penicillins Rash and Other (See Comments)    Rash and blisters Has patient had a PCN reaction causing immediate rash, facial/tongue/throat swelling, SOB or lightheadedness with hypotension: Yes Has patient had a PCN reaction causing severe rash involving mucus membranes or skin necrosis: No Has patient had a PCN reaction that required hospitalization: No Has patient had a PCN reaction occurring within the last 10 years: No If all of the above answers are "NO", then may proceed with Cephalosporin use.    Sulfa Antibiotics Rash    Past Medical History, Surgical  history, Social history, and Family History were reviewed and updated.  Review of Systems: Review of Systems  Constitutional:  Positive for fatigue.  HENT:  Negative.    Eyes: Negative.   Respiratory: Negative.    Cardiovascular: Negative.   Gastrointestinal: Negative.   Endocrine: Negative.   Genitourinary: Negative.    Musculoskeletal:  Positive for arthralgias and myalgias.  Skin:  Positive for rash.  Neurological: Negative.   Hematological: Negative.   Psychiatric/Behavioral: Negative.      Physical Exam: Temperature is 97.6.  Pulse 79.  Blood pressure 117/57.  Weight is 132 pounds.     Wt Readings from Last 3 Encounters:  06/20/23 132 lb 9.6 oz (60.1 kg)  05/19/23 132 lb (59.9 kg)  04/15/23 132 lb (59.9 kg)    Physical Exam Vitals reviewed.  Constitutional:      Comments: Her breast exam shows right breast with no masses, edema or erythema.  There is no right axillary adenopathy.  Left breast shows the lumpectomy scar at about the 2 o'clock position.  There is little firmness at the lumpectomy site.  No distinct masses noted.  There is no left axillary adenopathy.  There is no left nipple discharge.  HENT:     Head: Normocephalic and atraumatic.  Eyes:     Pupils: Pupils are equal, round, and reactive to light.  Cardiovascular:     Rate and Rhythm: Normal rate and regular rhythm.     Heart sounds: Normal heart sounds.  Pulmonary:     Effort: Pulmonary effort is normal.     Breath sounds: Normal breath sounds.  Abdominal:     General: Bowel sounds are normal.     Palpations: Abdomen is soft.  Musculoskeletal:        General: No tenderness or deformity. Normal range of motion.     Cervical back: Normal range of motion.  Lymphadenopathy:     Cervical: No cervical adenopathy.  Skin:    General: Skin is warm and dry.     Findings: No erythema or rash.     Comments: Skin exam shows some changes of the scleroderma.  This is mostly on the face, around the lip area.   Her arms do show a little bit of skin thickening.  Neurological:     Mental Status: She is alert and oriented to person, place, and time.  Psychiatric:        Behavior: Behavior normal.        Thought Content: Thought content normal.        Judgment: Judgment normal.      Lab Results  Component Value Date   WBC 7.6 06/20/2023   HGB 9.7 (L) 06/20/2023   HCT 30.0 (L) 06/20/2023  MCV 94.3 06/20/2023   PLT 247 06/20/2023     Chemistry      Component Value Date/Time   NA 137 05/19/2023 0930   NA 144 01/27/2020 1148   NA 141 06/09/2017 1117   K 3.6 05/19/2023 0930   K 3.9 06/09/2017 1117   CL 103 05/19/2023 0930   CL 109 (H) 12/14/2012 1253   CO2 27 05/19/2023 0930   CO2 23 06/09/2017 1117   BUN 18 05/19/2023 0930   BUN 11 01/27/2020 1148   BUN 11.5 06/09/2017 1117   CREATININE 1.04 (H) 05/19/2023 0930   CREATININE 1.2 (H) 06/09/2017 1117      Component Value Date/Time   CALCIUM 9.2 05/19/2023 0930   CALCIUM 8.9 06/09/2017 1117   ALKPHOS 56 05/19/2023 0930   ALKPHOS 40 06/09/2017 1117   AST 25 05/19/2023 0930   AST 35 (H) 06/09/2017 1117   ALT 12 05/19/2023 0930   ALT 19 06/09/2017 1117   BILITOT 0.5 05/19/2023 0930   BILITOT 0.37 06/09/2017 1117      Impression and Plan: [name] is a very charming 72 year old white female.  She has had a history of early stage breast cancer.  So far, we have not found any problems with respect to recurrent disease.  We always have to check her CA 27.29.  Again, it was better last time we checked.  She does have autoimmune issues.  She is getting IVIG.  We are going to change her IVIG schedule a little bit.  She will get 5 days of IVIG.    I will plan to have her come back in 6 weeks.  She needs 3 doses of IVIG every 6 weeks and then I think 5 doses every 8 weeks after that.   Josph Macho, MD 12/6/20248:47 AM

## 2023-06-20 NOTE — Patient Instructions (Signed)
[facility] - A DEPT OF [facility]  Discharge Instructions: Thank you for choosing [facility] to provide your oncology and hematology care.   If you have a lab appointment with the Cancer Center, please go directly to the Cancer Center and check in at the registration area.  Wear comfortable clothing and clothing appropriate for easy access to any Portacath or PICC line.   We strive to give you quality time with your provider. You may need to reschedule your appointment if you arrive late (15 or more minutes).  Arriving late affects you and other patients whose appointments are after yours.  Also, if you miss three or more appointments without notifying the office, you may be dismissed from the clinic at the provider's discretion.      For prescription refill requests, have your pharmacy contact our office and allow 72 hours for refills to be completed.    Today you received the following chemotherapy and/or immunotherapy agents IVIG      To help prevent nausea and vomiting after your treatment, we encourage you to take your nausea medication as directed.  BELOW ARE SYMPTOMS THAT SHOULD BE REPORTED IMMEDIATELY: *FEVER GREATER THAN 100.4 F (38 C) OR HIGHER *CHILLS OR SWEATING *NAUSEA AND VOMITING THAT IS NOT CONTROLLED WITH YOUR NAUSEA MEDICATION *UNUSUAL SHORTNESS OF BREATH *UNUSUAL BRUISING OR BLEEDING *URINARY PROBLEMS (pain or burning when urinating, or frequent urination) *BOWEL PROBLEMS (unusual diarrhea, constipation, pain near the anus) TENDERNESS IN MOUTH AND THROAT WITH OR WITHOUT PRESENCE OF ULCERS (sore throat, sores in mouth, or a toothache) UNUSUAL RASH, SWELLING OR PAIN  UNUSUAL VAGINAL DISCHARGE OR ITCHING   Items with * indicate a potential emergency and should be followed up as soon as possible or go to the Emergency Department if any problems should occur.  Please show the CHEMOTHERAPY ALERT CARD or IMMUNOTHERAPY ALERT  CARD at check-in to the Emergency Department and triage nurse. Should you have questions after your visit or need to cancel or reschedule your appointment, please contact [facility] - A DEPT OF [facility]  [phone] and follow the prompts.  Office hours are 8:00 a.m. to 4:30 p.m. Monday - Friday. Please note that voicemails left after 4:00 p.m. may not be returned until the following business day.  We are closed weekends and major holidays. You have access to a nurse at all times for urgent questions. Please call the main number to the clinic [phone] and follow the prompts.  For any non-urgent questions, you may also contact your provider using MyChart. We now offer e-Visits for anyone 76 and older to request care online for non-urgent symptoms. For details visit [email or web address].   Also download the MyChart app! Go to the app store, search "MyChart", open the app, select [place], and log in with your MyChart username and password.

## 2023-06-22 LAB — IGG, IGA, IGM
IgA: 129 mg/dL (ref 64–422)
IgG (Immunoglobin G), Serum: 1358 mg/dL (ref 586–1602)
IgM (Immunoglobulin M), Srm: 53 mg/dL (ref 26–217)

## 2023-06-22 LAB — ALDOLASE: Aldolase: 4.4 U/L (ref 3.3–10.3)

## 2023-06-23 ENCOUNTER — Inpatient Hospital Stay: Payer: Medicare Other

## 2023-06-23 VITALS — BP 122/56 | HR 78 | Temp 98.2°F | Resp 18

## 2023-06-23 DIAGNOSIS — D631 Anemia in chronic kidney disease: Secondary | ICD-10-CM | POA: Diagnosis not present

## 2023-06-23 DIAGNOSIS — M349 Systemic sclerosis, unspecified: Secondary | ICD-10-CM

## 2023-06-23 DIAGNOSIS — D509 Iron deficiency anemia, unspecified: Secondary | ICD-10-CM

## 2023-06-23 DIAGNOSIS — Z853 Personal history of malignant neoplasm of breast: Secondary | ICD-10-CM | POA: Diagnosis not present

## 2023-06-23 DIAGNOSIS — D499 Neoplasm of unspecified behavior of unspecified site: Secondary | ICD-10-CM

## 2023-06-23 DIAGNOSIS — R059 Cough, unspecified: Secondary | ICD-10-CM | POA: Diagnosis not present

## 2023-06-23 DIAGNOSIS — Z79899 Other long term (current) drug therapy: Secondary | ICD-10-CM | POA: Diagnosis not present

## 2023-06-23 DIAGNOSIS — N1831 Chronic kidney disease, stage 3a: Secondary | ICD-10-CM | POA: Diagnosis not present

## 2023-06-23 LAB — KAPPA/LAMBDA LIGHT CHAINS
Kappa free light chain: 16.3 mg/L (ref 3.3–19.4)
Kappa, lambda light chain ratio: 1.29 (ref 0.26–1.65)
Lambda free light chains: 12.6 mg/L (ref 5.7–26.3)

## 2023-06-23 MED ORDER — DIPHENHYDRAMINE HCL 25 MG PO CAPS: 25.0000 mg | ORAL_CAPSULE | Freq: Once | ORAL | Status: DC

## 2023-06-23 MED ORDER — SODIUM CHLORIDE 0.9% FLUSH
10.0000 mL | Freq: Once | INTRAVENOUS | Status: AC
  Administered 2023-06-23: 10 mL

## 2023-06-23 MED ORDER — DEXTROSE 5 % IV SOLN: INTRAVENOUS | Status: DC

## 2023-06-23 MED ORDER — IMMUNE GLOBULIN (HUMAN) 20 GM/200ML IV SOLN
25.0000 g | Freq: Once | INTRAVENOUS | Status: AC
  Administered 2023-06-23: 25 g via INTRAVENOUS
  Filled 2023-06-23: qty 100

## 2023-06-23 MED ORDER — HEPARIN SOD (PORK) LOCK FLUSH 100 UNIT/ML IV SOLN
500.0000 [IU] | Freq: Once | INTRAVENOUS | Status: AC
Start: 2023-06-23 — End: 2023-06-23
  Administered 2023-06-23: 500 [IU] via INTRAVENOUS

## 2023-06-23 MED ORDER — ACETAMINOPHEN 325 MG PO TABS: 650.0000 mg | ORAL_TABLET | Freq: Once | ORAL | Status: DC

## 2023-06-23 NOTE — Patient Instructions (Signed)

## 2023-06-24 ENCOUNTER — Encounter: Payer: Self-pay | Admitting: Internal Medicine

## 2023-06-24 ENCOUNTER — Ambulatory Visit: Payer: Medicare Other | Admitting: Internal Medicine

## 2023-06-24 VITALS — BP 112/66 | HR 84 | Ht 64.0 in | Wt 132.5 lb

## 2023-06-24 DIAGNOSIS — K2100 Gastro-esophageal reflux disease with esophagitis, without bleeding: Secondary | ICD-10-CM

## 2023-06-24 DIAGNOSIS — Z860100 Personal history of colon polyps, unspecified: Secondary | ICD-10-CM

## 2023-06-24 DIAGNOSIS — K224 Dyskinesia of esophagus: Secondary | ICD-10-CM

## 2023-06-24 DIAGNOSIS — Z860101 Personal history of adenomatous and serrated colon polyps: Secondary | ICD-10-CM

## 2023-06-24 DIAGNOSIS — R1319 Other dysphagia: Secondary | ICD-10-CM | POA: Diagnosis not present

## 2023-06-24 DIAGNOSIS — M349 Systemic sclerosis, unspecified: Secondary | ICD-10-CM

## 2023-06-24 LAB — CANCER ANTIGEN 27.29: CA 27.29: 48.9 U/mL — ABNORMAL HIGH (ref 0.0–38.6)

## 2023-06-24 NOTE — Progress Notes (Signed)
Patient ID: [name], female   DOB: 1950-12-09, 72 y.o.   MRN: [id number] HPI: Discussed the use of AI scribe software for clinical note transcription with the patient, who gave verbal consent to proceed.  History of Present Illness   Shawndra Slayback, a 72 year old female with a complex medical history including scleroderma with systemic sclerosis, GERD with reflux esophagitis and hiatal hernia, esophageal stricture, colonic polyps, previous breast cancer, hypothyroidism, and hyperlipidemia, presents for follow-up.  She is here today with her husband and I last saw her on 02/06/2023.    The patient's primary concern is difficulty swallowing, which has been a persistent issue despite ongoing treatment with twice-daily pantoprazole.  The patient reports that the swallowing difficulty is not constant but occurs regularly, particularly with certain types of food such as green beans, spinach, and shredded chicken. The patient has been managing this issue by chewing food thoroughly and maintaining an upright posture during and after meals. Despite these efforts, the patient continues to experience swallowing difficulties, which has significantly impacted her quality of life and relationship with food.  She is using a protein supplement called Orgain.  The patient also reports that she has been receiving IVIG treatments for her scleroderma, currently in the middle of her seventh treatment cycle. These treatments have reportedly led to improvements in muscle inflammation, allowing the patient to move and bend more easily. However, the patient notes that the treatments have not significantly improved the muscle issues in her esophagus.  The patient's IVIG treatments have been associated with a pattern of bowel changes, shifting from firm to less firm stools during the treatment period, which then return to normal post-treatment. The patient also reports that these treatments have been physically taxing,  leading to periods of feeling unwell.  In addition to the swallowing difficulties and the effects of the IVIG treatments, the patient also reports stable weight and ongoing use of CellCept. The patient has been supplementing her diet with a protein drink, Orgain, which has reportedly helped maintain her protein levels.  The patient's medical history includes multiple adenomatous polyps detected in 2016, but no colon polyps were found in 2019. The patient was due for a five-year interval colonoscopy in 2024, but this has been deferred. A cardiac MRI was performed to evaluate for potential scleroderma involvement with the heart, which showed no evidence of myocardial inflammation or scarring, and normal left ventricular and right ventricular function. However, a moderate pericardial effusion was noted.  The patient's complex medical history and ongoing symptoms, particularly the persistent swallowing difficulties, continue to be a significant concern. Despite the challenges, the patient has shown resilience and an active role in managing her health conditions.      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 Gaynell Face at [facility]   systemic sclerosis , diffuse/   rheumotologist:  dr Sherryll Burger at [facility]---  lov 07-22-2017 (as of 07-25-2017 note not available to read)---  per pt has appt. w/ specialist at [facility]   Skin thickening    hands, arms, legs  caused by scleroderma   Systemic sclerosis (HCC)    UTI (urinary tract infection)     Past Surgical History:  Procedure Laterality Date   BIOPSY  01/29/2018   Procedure: BIOPSY;  Surgeon: Beverley Fiedler, MD;  Location: [facility];  Service: Gastroenterology;;   Arbutus Leas  2000   Left   COLONOSCOPY WITH PROPOFOL N/A 01/29/2018   Procedure: COLONOSCOPY WITH PROPOFOL;  Surgeon: Beverley Fiedler, MD;  Location: [facility];  Service: Gastroenterology;  Laterality: N/A;   DILATATION & CURETTAGE/HYSTEROSCOPY WITH MYOSURE N/A 08/04/2017   Procedure: DILATATION & CURETTAGE/HYSTEROSCOPY WITH MYOSURE;  Surgeon: Jerene Bears, MD;  Location: [facility];  Service: Gynecology;  Laterality: N/A;   ECTOPIC PREGNANCY SURGERY  1986-88   s/p unilateral salpingectomy   ESOPHAGOGASTRODUODENOSCOPY (EGD) WITH PROPOFOL N/A 01/29/2018   Procedure: ESOPHAGOGASTRODUODENOSCOPY (EGD) WITH PROPOFOL;  Surgeon: Beverley Fiedler, MD;  Location: [facility];  Service: Gastroenterology;  Laterality: N/A;   IR IMAGING GUIDED PORT INSERTION  12/02/2022   PARTIAL MASTECTOMY WITH AXILLARY SENTINEL LYMPH NODE BIOPSY Left 11-04-2011   dr Jamey Ripa  [facility]   RIGHT HEART CATH N/A 10/01/2022   Procedure: RIGHT HEART CATH;  Surgeon: Dolores Patty, MD;  Location: [facility] INVASIVE CV LAB;  Service: Cardiovascular;  Laterality: N/A;   TRANSTHORACIC ECHOCARDIOGRAM  06-04-2017    [facility]   moderate LVH, ef>55%/  trivial AR and TR/ mild MR and PR/  trivial pericardial  effusion   WISDOM TOOTH EXTRACTION      Outpatient Medications Prior to Visit  Medication Sig Dispense Refill   acetaminophen (TYLENOL) 650 MG CR tablet Take 650 mg by mouth daily as needed for pain.     diphenhydrAMINE (BENADRYL) 25 mg capsule Take 25 mg by mouth every 6 (six) hours as needed for allergies. Allergies and before IVIG treatment.     ELDERBERRY PO Take 1 oz by mouth 4 (four) times daily as needed. Liquid     furosemide (LASIX) 20 MG tablet Take 1 tablet (20 mg total) by mouth daily. 90 tablet 1   levothyroxine (SYNTHROID) 100 MCG tablet Take 1 tablet (100 mcg total) by mouth daily before breakfast. 30 tablet 2   losartan (COZAAR) 25 MG tablet Take 25 mg by mouth in the morning and at bedtime.     Multiple Vitamin (MULTIVITAMIN) capsule Take 1 capsule by mouth daily.     mycophenolate (CELLCEPT) 500 MG tablet Take 1,500 mg by mouth 2 (two) times daily.     pantoprazole (PROTONIX) 40 MG tablet Take 40 mg by mouth 2 (two) times daily.     sodium chloride (OCEAN) 0.65 % SOLN nasal spray Place 1 spray into both nostrils as needed for congestion.     No facility-administered medications prior to visit.  Allergies  Allergen Reactions   Betadine [Povidone Iodine] Rash   Epinephrine Other (See Comments)    Severe headaches    Iodinated Contrast Media Other (See Comments) and Rash    "per pt: recently had CT 08/ 2018 even through given pre-medication, IVP still caused rash"  "per pt recently had CT 08/ 2018 even through given pre-medication, IVP still caused rash"   Iodine Rash and Other (See Comments)   Oxycodone Anxiety   Prednisone Anaphylaxis    Per Nephrology pt should avoid this medication because she has Scleroderma and it will interfere with her Kidneys,    Gabapentin Other (See Comments)    Dizziness   Lidocaine Rash   Nickel Rash and Other (See Comments)    Rash and blisters   Other Other (See Comments) and Rash    Hospital gown   Penicillins Rash and Other  (See Comments)    Rash and blisters Has patient had a PCN reaction causing immediate rash, facial/tongue/throat swelling, SOB or lightheadedness with hypotension: Yes Has patient had a PCN reaction causing severe rash involving mucus membranes or skin necrosis: No Has patient had a PCN reaction that required hospitalization: No Has patient had a PCN reaction occurring within the last 10 years: No If all of the above answers are "NO", then may proceed with Cephalosporin use.    Sulfa Antibiotics Rash    Family History  Problem Relation Age of Onset   Heart disease Mother    Heart failure Mother    Heart disease Father    Ovarian cancer Maternal Aunt    Heart disease Paternal Uncle        multiple   Autoimmune disease Sister    Arrhythmia Sister    Anemia Sister    CAD Brother    Colon cancer Neg Hx    Stomach cancer Neg Hx    Esophageal cancer Neg Hx    Rectal cancer Neg Hx     Social History   Tobacco Use   Smoking status: Never   Smokeless tobacco: Never  Vaping Use   Vaping status: Never Used  Substance Use Topics   Alcohol use: Yes    Comment: socially   Drug use: No    ROS: As per history of present illness, otherwise negative  BP 112/66   Pulse 84   Ht 5\' 4"  (1.626 m)   Wt 132 lb 8 oz (60.1 kg)   BMI 22.74 kg/m  Gen: awake, alert, NAD HEENT: anicteric  Ext: no c/c/e Neuro: nonfocal   RELEVANT LABS AND IMAGING: CBC    Component Value Date/Time   WBC 7.6 06/20/2023 0810   WBC 7.9 10/28/2022 0958   RBC 3.08 (L) 06/20/2023 0810   RBC 3.18 (L) 06/20/2023 0810   HGB 9.7 (L) 06/20/2023 0810   HGB 10.5 (L) 01/27/2020 1148   HGB 11.1 (L) 06/09/2017 1117   HCT 30.0 (L) 06/20/2023 0810   HCT 33.3 (L) 01/27/2020 1148   HCT 34.1 (L) 06/09/2017 1117   PLT 247 06/20/2023 0810   PLT 276 01/27/2020 1148   MCV 94.3 06/20/2023 0810   MCV 93 01/27/2020 1148   MCV 93.9 06/09/2017 1117   MCH 30.5 06/20/2023 0810   MCHC 32.3 06/20/2023 0810   RDW 14.2  06/20/2023 0810   RDW 13.3 01/27/2020 1148   RDW 15.9 (H) 06/09/2017 1117   LYMPHSABS 0.6 (L) 06/20/2023 0810   LYMPHSABS 0.6 (L) 06/09/2017 1117   MONOABS 0.5 06/20/2023 0810  MONOABS 0.6 06/09/2017 1117   EOSABS 0.1 06/20/2023 0810   EOSABS 0.2 06/09/2017 1117   BASOSABS 0.0 06/20/2023 0810   BASOSABS 0.0 06/09/2017 1117    CMP     Component Value Date/Time   NA 132 (L) 06/20/2023 0810   NA 144 01/27/2020 1148   NA 141 06/09/2017 1117   K 3.4 (L) 06/20/2023 0810   K 3.9 06/09/2017 1117   CL 102 06/20/2023 0810   CL 109 (H) 12/14/2012 1253   CO2 22 06/20/2023 0810   CO2 23 06/09/2017 1117   GLUCOSE 102 (H) 06/20/2023 0810   GLUCOSE 101 06/09/2017 1117   GLUCOSE 94 12/14/2012 1253   BUN 19 06/20/2023 0810   BUN 11 01/27/2020 1148   BUN 11.5 06/09/2017 1117   CREATININE 1.15 (H) 06/20/2023 0810   CREATININE 1.2 (H) 06/09/2017 1117   CALCIUM 8.7 (L) 06/20/2023 0810   CALCIUM 8.9 06/09/2017 1117   PROT 6.7 06/20/2023 0810   PROT 7.0 01/27/2020 1148   PROT 5.8 (L) 06/09/2017 1117   ALBUMIN 3.4 (L) 06/20/2023 0810   ALBUMIN 4.6 01/27/2020 1148   ALBUMIN 3.2 (L) 06/09/2017 1117   AST 25 06/20/2023 0810   AST 35 (H) 06/09/2017 1117   ALT 14 06/20/2023 0810   ALT 19 06/09/2017 1117   ALKPHOS 55 06/20/2023 0810   ALKPHOS 40 06/09/2017 1117   BILITOT 0.4 06/20/2023 0810   BILITOT 0.37 06/09/2017 1117   GFRNONAA 50 (L) 06/20/2023 0810   GFRAA 55 (L) 02/15/2020 1353    Results   LABS CK: 102  RADIOLOGY Cardiac MRI: No evidence of myocardial inflammation. No late gadolinium enhancement to suggest myocardial scarring. LV normal thickness and size with EF 53%. RV size and systolic function 51%. Moderate pericardial effusion measuring up to 15 mm adjacent to the right atrium.      ASSESSMENT/PLAN: Assessment and Plan    Scleroderma with Systemic Sclerosis Noted improvement in mobility with IVIG treatment, but persistent dysphagia. Discussed the role of scleroderma in  esophageal motility issues and the potential benefit of an endoscopy to assess inflammation and potential for dilation. -Continue IVIG treatment as planned. -Order Barium swallow to assess esophageal motility and potential target spot for dilation. -Schedule endoscopy in February 2025 to assess inflammation and potential for dilation.  GERD with Reflux Esophagitis and Hiatal Hernia No significant heartburn or indigestion reported. Discussed the role of Pantoprazole in reducing stomach acid and the potential benefit of a new medication, Vonoprazan, if inflammation is not resolved. -Continue Pantoprazole twice daily. -EGD as above  Colon Polyps History of multiple adenomatous polyps in 2016, but none in 2019. Discussed deferring colonoscopy given current health status and regular blood work monitoring. -Defer colonoscopy for now, patient preference as well.  Breast Cancer Regular monitoring of cancer antigens. No current concerns. -Continue regular monitoring as planned.   General Health Maintenance / Followup Plans -Continue monitoring CK levels. -Continue with protein supplement (Orgain) to maintain protein levels. -Follow up with Dr. Marchelle Gearing on July 22, 2023. -Check hemoglobin levels regularly due to history of anemia. -Schedule next IVIG treatment for July 26, 2023. -Plan to gradually extend intervals between IVIG treatments over the next nine months, as tolerated.     45 minutes total spent today including patient facing time, coordination of care, reviewing medical history/procedures/pertinent radiology studies, and documentation of the encounter.   [id number], Gwenlyn Found, Md [address] [place],  [place] [address]

## 2023-06-24 NOTE — Patient Instructions (Signed)
You have been scheduled for an endoscopy. Please follow written instructions given to you at your visit today.  If you use inhalers (even only as needed), please bring them with you on the day of your procedure.  If you take any of the following medications, they will need to be adjusted prior to your procedure:   DO NOT TAKE 7 DAYS PRIOR TO TEST- Trulicity (dulaglutide) Ozempic, Wegovy (semaglutide) Mounjaro (tirzepatide) Bydureon Bcise (exanatide extended release)  DO NOT TAKE 1 DAY PRIOR TO YOUR TEST Rybelsus (semaglutide) Adlyxin (lixisenatide) Victoza (liraglutide) Byetta (exanatide) ___________________________________________________________________________    You have been scheduled for a Barium Esophogram at [facility] (1st floor of the hospital) on 07/18/23 at 10:00 am . Please arrive 15 minutes prior to your appointment for registration. Make certain not to have anything to eat or drink 3 hours prior to your test. If you need to reschedule for any reason, please contact radiology at [phone] to do so. __________________________________________________________________ A barium swallow is an examination that concentrates on views of the esophagus. This tends to be a double contrast exam (barium and two liquids which, when combined, create a gas to distend the wall of the oesophagus) or single contrast (non-ionic iodine based). The study is usually tailored to your symptoms so a good history is essential. Attention is paid during the study to the form, structure and configuration of the esophagus, looking for functional disorders (such as aspiration, dysphagia, achalasia, motility and reflux) EXAMINATION You may be asked to change into a gown, depending on the type of swallow being performed. A radiologist and radiographer will perform the procedure. The radiologist will advise you of the type of contrast selected for your procedure and direct you during the exam. You  will be asked to stand, sit or lie in several different positions and to hold a small amount of fluid in your mouth before being asked to swallow while the imaging is performed .In some instances you may be asked to swallow barium coated marshmallows to assess the motility of a solid food bolus. The exam can be recorded as a digital or video fluoroscopy procedure. POST PROCEDURE It will take 1-2 days for the barium to pass through your system. To facilitate this, it is important, unless otherwise directed, to increase your fluids for the next 24-48hrs and to resume your normal diet.  This test typically takes about 30 minutes to perform. __________________________________________________________________________________  Due to recent changes in healthcare laws, you may see the results of your imaging and laboratory studies on MyChart before your provider has had a chance to review them.  We understand that in some cases there may be results that are confusing or concerning to you. Not all laboratory results come back in the same time frame and the provider may be waiting for multiple results in order to interpret others.  Please give [place] 48 hours in order for your provider to thoroughly review all the results before contacting the office for clarification of your results.   Thank you for choosing me and [facility].  Dr. Vonna Kotyk Pyrtle

## 2023-06-25 ENCOUNTER — Inpatient Hospital Stay: Payer: Medicare Other

## 2023-06-25 VITALS — BP 99/70 | HR 86 | Resp 20

## 2023-06-25 DIAGNOSIS — Z79899 Other long term (current) drug therapy: Secondary | ICD-10-CM | POA: Diagnosis not present

## 2023-06-25 DIAGNOSIS — M349 Systemic sclerosis, unspecified: Secondary | ICD-10-CM | POA: Diagnosis not present

## 2023-06-25 DIAGNOSIS — R059 Cough, unspecified: Secondary | ICD-10-CM | POA: Diagnosis not present

## 2023-06-25 DIAGNOSIS — D509 Iron deficiency anemia, unspecified: Secondary | ICD-10-CM

## 2023-06-25 DIAGNOSIS — N1831 Chronic kidney disease, stage 3a: Secondary | ICD-10-CM | POA: Diagnosis not present

## 2023-06-25 DIAGNOSIS — D631 Anemia in chronic kidney disease: Secondary | ICD-10-CM | POA: Diagnosis not present

## 2023-06-25 DIAGNOSIS — Z853 Personal history of malignant neoplasm of breast: Secondary | ICD-10-CM | POA: Diagnosis not present

## 2023-06-25 MED ORDER — SODIUM CHLORIDE 0.9% FLUSH
10.0000 mL | INTRAVENOUS | Status: DC | PRN
  Administered 2023-06-25: 10 mL via INTRAVENOUS

## 2023-06-25 MED ORDER — ACETAMINOPHEN 325 MG PO TABS: 650.0000 mg | ORAL_TABLET | Freq: Once | ORAL | Status: DC

## 2023-06-25 MED ORDER — HEPARIN SOD (PORK) LOCK FLUSH 100 UNIT/ML IV SOLN
500.0000 [IU] | Freq: Once | INTRAVENOUS | Status: AC
  Administered 2023-06-25: 500 [IU] via INTRAVENOUS

## 2023-06-25 MED ORDER — IMMUNE GLOBULIN (HUMAN) 20 GM/200ML IV SOLN
25.0000 g | Freq: Once | INTRAVENOUS | Status: AC
Start: 2023-06-25 — End: 2023-06-25
  Administered 2023-06-25: 25 g via INTRAVENOUS
  Filled 2023-06-25: qty 250

## 2023-06-25 MED ORDER — DIPHENHYDRAMINE HCL 25 MG PO CAPS
25.0000 mg | ORAL_CAPSULE | Freq: Once | ORAL | Status: DC
Start: 2023-06-25 — End: 2023-06-25

## 2023-06-25 MED ORDER — DEXTROSE 5 % IV SOLN: INTRAVENOUS | Status: DC

## 2023-06-25 NOTE — Patient Instructions (Signed)

## 2023-06-26 ENCOUNTER — Inpatient Hospital Stay: Payer: Medicare Other

## 2023-06-26 VITALS — BP 115/61 | HR 79 | Temp 98.2°F | Resp 18

## 2023-06-26 DIAGNOSIS — Z853 Personal history of malignant neoplasm of breast: Secondary | ICD-10-CM | POA: Diagnosis not present

## 2023-06-26 DIAGNOSIS — M349 Systemic sclerosis, unspecified: Secondary | ICD-10-CM | POA: Diagnosis not present

## 2023-06-26 DIAGNOSIS — N1831 Chronic kidney disease, stage 3a: Secondary | ICD-10-CM | POA: Diagnosis not present

## 2023-06-26 DIAGNOSIS — R059 Cough, unspecified: Secondary | ICD-10-CM | POA: Diagnosis not present

## 2023-06-26 DIAGNOSIS — D631 Anemia in chronic kidney disease: Secondary | ICD-10-CM | POA: Diagnosis not present

## 2023-06-26 DIAGNOSIS — Z79899 Other long term (current) drug therapy: Secondary | ICD-10-CM | POA: Diagnosis not present

## 2023-06-26 DIAGNOSIS — D509 Iron deficiency anemia, unspecified: Secondary | ICD-10-CM

## 2023-06-26 MED ORDER — DEXTROSE 5 % IV SOLN: INTRAVENOUS | Status: DC

## 2023-06-26 MED ORDER — ACETAMINOPHEN 325 MG PO TABS
650.0000 mg | ORAL_TABLET | Freq: Once | ORAL | Status: DC
Start: 2023-06-26 — End: 2023-06-26

## 2023-06-26 MED ORDER — HEPARIN SOD (PORK) LOCK FLUSH 100 UNIT/ML IV SOLN
500.0000 [IU] | Freq: Once | INTRAVENOUS | Status: AC
  Administered 2023-06-26: 500 [IU] via INTRAVENOUS

## 2023-06-26 MED ORDER — DIPHENHYDRAMINE HCL 25 MG PO CAPS
25.0000 mg | ORAL_CAPSULE | Freq: Once | ORAL | Status: DC
Start: 2023-06-26 — End: 2023-06-26

## 2023-06-26 MED ORDER — IMMUNE GLOBULIN (HUMAN) 20 GM/200ML IV SOLN
25.0000 g | Freq: Once | INTRAVENOUS | Status: AC
Start: 2023-06-26 — End: 2023-06-26
  Administered 2023-06-26: 25 g via INTRAVENOUS
  Filled 2023-06-26: qty 250

## 2023-06-26 MED ORDER — SODIUM CHLORIDE 0.9% FLUSH
10.0000 mL | Freq: Once | INTRAVENOUS | Status: AC
Start: 2023-06-26 — End: 2023-06-26
  Administered 2023-06-26: 10 mL

## 2023-06-26 NOTE — Patient Instructions (Signed)

## 2023-06-26 NOTE — Progress Notes (Signed)
Patient states she does not want to stay for the recommended post IVIG observation. VSS. Patient discharged ambulatory without complaints or concerns.

## 2023-06-27 ENCOUNTER — Inpatient Hospital Stay: Payer: Medicare Other

## 2023-06-27 VITALS — BP 125/74 | HR 79 | Temp 97.5°F | Resp 18

## 2023-06-27 DIAGNOSIS — Z853 Personal history of malignant neoplasm of breast: Secondary | ICD-10-CM | POA: Diagnosis not present

## 2023-06-27 DIAGNOSIS — D509 Iron deficiency anemia, unspecified: Secondary | ICD-10-CM

## 2023-06-27 DIAGNOSIS — N1831 Chronic kidney disease, stage 3a: Secondary | ICD-10-CM | POA: Diagnosis not present

## 2023-06-27 DIAGNOSIS — Z79899 Other long term (current) drug therapy: Secondary | ICD-10-CM | POA: Diagnosis not present

## 2023-06-27 DIAGNOSIS — D631 Anemia in chronic kidney disease: Secondary | ICD-10-CM | POA: Diagnosis not present

## 2023-06-27 DIAGNOSIS — M349 Systemic sclerosis, unspecified: Secondary | ICD-10-CM | POA: Diagnosis not present

## 2023-06-27 DIAGNOSIS — R059 Cough, unspecified: Secondary | ICD-10-CM | POA: Diagnosis not present

## 2023-06-27 MED ORDER — IMMUNE GLOBULIN (HUMAN) 20 GM/200ML IV SOLN
25.0000 g | Freq: Once | INTRAVENOUS | Status: AC
  Administered 2023-06-27: 25 g via INTRAVENOUS
  Filled 2023-06-27: qty 200

## 2023-06-27 MED ORDER — DIPHENHYDRAMINE HCL 25 MG PO CAPS
25.0000 mg | ORAL_CAPSULE | Freq: Once | ORAL | Status: DC
Start: 2023-06-27 — End: 2023-06-27

## 2023-06-27 MED ORDER — ACETAMINOPHEN 325 MG PO TABS: 650.0000 mg | ORAL_TABLET | Freq: Once | ORAL | Status: DC

## 2023-06-27 MED ORDER — DEXTROSE 5 % IV SOLN: INTRAVENOUS | Status: DC

## 2023-06-27 NOTE — Patient Instructions (Signed)

## 2023-07-15 DIAGNOSIS — L821 Other seborrheic keratosis: Secondary | ICD-10-CM | POA: Diagnosis not present

## 2023-07-15 DIAGNOSIS — W908XXD Exposure to other nonionizing radiation, subsequent encounter: Secondary | ICD-10-CM | POA: Diagnosis not present

## 2023-07-15 DIAGNOSIS — L578 Other skin changes due to chronic exposure to nonionizing radiation: Secondary | ICD-10-CM | POA: Diagnosis not present

## 2023-07-15 DIAGNOSIS — D2271 Melanocytic nevi of right lower limb, including hip: Secondary | ICD-10-CM | POA: Diagnosis not present

## 2023-07-18 ENCOUNTER — Ambulatory Visit (HOSPITAL_COMMUNITY)
Admission: RE | Admit: 2023-07-18 | Discharge: 2023-07-18 | Disposition: A | Discharge status: Home / Self Care | Payer: Medicare Other | Source: Ambulatory Visit | Attending: Internal Medicine | Admitting: Internal Medicine

## 2023-07-18 ENCOUNTER — Encounter: Payer: Self-pay | Admitting: Internal Medicine

## 2023-07-18 DIAGNOSIS — M349 Systemic sclerosis, unspecified: Secondary | ICD-10-CM | POA: Diagnosis not present

## 2023-07-18 DIAGNOSIS — K2100 Gastro-esophageal reflux disease with esophagitis, without bleeding: Secondary | ICD-10-CM | POA: Insufficient documentation

## 2023-07-18 DIAGNOSIS — R1310 Dysphagia, unspecified: Secondary | ICD-10-CM | POA: Diagnosis not present

## 2023-07-18 DIAGNOSIS — R1319 Other dysphagia: Secondary | ICD-10-CM | POA: Insufficient documentation

## 2023-07-18 DIAGNOSIS — K449 Diaphragmatic hernia without obstruction or gangrene: Secondary | ICD-10-CM | POA: Diagnosis not present

## 2023-07-18 DIAGNOSIS — K224 Dyskinesia of esophagus: Secondary | ICD-10-CM | POA: Diagnosis not present

## 2023-07-21 NOTE — Progress Notes (Addendum)
 08/27/22 [facility]   IMPRESSION: 1. Interstitial lung disease/fibrosis. Nonspecific patchy bilateral groundglass opacities. 2. Moderate pericardial effusion. 3. Small left pleural effusion, trace right pleural effusion. 4. Mediastinal adenopathy.  Assessment and Plan Ms. Milson is a 72 y.o. 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 The patient also has a history of left breast cancer. Since her last visit, she has evidence of increased SSc disease activity as manifested by skin thickening, oropharyngeal dysphagia, tendon friction rubs and scleroderma renal crisis. Her course has also been complicated by worsening lower extremity edema. It is unclear what is driving her worsening disease activity, though notably she had been off of MMF for a period of time prior to the recurrence of her symptoms. Notably, she has an elevated CA 27.29 in the past - which makes one wonder about subclinical malignancy driving her symptoms.  Cutaneous: Skin score today was 17, a significant increase from her score of 3 at our last visit; we note that some of this is confounded by her lower extremity edema. However, despite this, we are in agreement that her skin score has worsened. She has been restarted on mycophenolate mofetil, and is currently on 1000 mg BID. We discussed that given her renal function we would not increase this further. She does have a history of difficult to treat shingles (lasted 9 months) when she previously was on MMF. She has been placed on valacyclovir  prophylaxis. We discussed that it would likely take a few months to see the full effect of this medication. - Continue MMF 1000 mg BID - Monitoring labs with local rheumatologist  Raynaud's phenomenon: Her symptoms are  stable. She is on amlodipine  for blood pressure management which may have additional benefit for her RP.  Cardiopulmonary: CT scan demonstrated bilateral ground glass opacities; however, these are non-specific in the setting of her volume overload. In addition, her PFTs are difficult to interpret in the setting of her known volume overload as well. Once she is euvolemic, these tests should be repeated. She is also planned to see Dr. Phebe in pulmonary in our Center tomorrow.  Gastrointestinal: She has GERD which is stable on pantoprazole  40 mg daily. Barium swallow demonstrated cricopharyngeal bar. She is planned for endoscopy next week to further evaluate her oropharyngeal dysphagia.  MSK: She has evidence of tendon friction rubs on exam, though she denies significant arthralgias. No evidence of inflammatory arthritis. She notes generalized weakness in the setting of her recent hospitalization but her strength is intact on exam.  Renal: She recently was hospitalized in the setting of scleroderma renal crisis. Her blood pressure today was stable at 122/68, and she has been monitoring her blood pressure closely at home. She is currently managed with lisinopril  40 mg and amlodipine  10 mg . She does not currently have a nephrologist and we strongly recommended that she establish care with one. We will reach out to Dr. Maree at [facility] to help coordinate care.  Patient was evaluated with Dr. Maree after which the above assessment and plan was formulated. Please see attending attestation for further details.    08/28/2022 Dr. Donnice Phebe -[facility]   Rheumatologist: Dr. Maree Primary pulmonologist, if applicable: Seen previously in the [facility] ILD center by Dr. Layman  YEP:Inwwj Wiley is a 74 y.o. female with a PMH of  SSc and ILD who presents for an initial evaluation regarding dyspnea. She is followed by Dr. Maree and was first diagnosed with scleroderma  in 2017 based on a high titer ANA and skin thickening. She has been followed previously in the [facility] ILD center, with her most recent office note being in 2020. In terms of her respiratory history, she had a dry cough that developed in 2018 and had a chest CT done that year which showed bibasilar interstitial infiltrates consistent with an NSIP pattern. She was started on CellCept in December 2018 with significant improvement in [employer] and diffusion capacity. Her CellCept was stopped in January 2023 due to prolonged episode of shingles.  She was recently admitted to [facility] for scleroderma renal crisis. By the time of discharge, she had improvement but not normalization of her creatinine. Blood pressure was better controlled. Mycophenolate was added back due to a worsening of her skin disease (now on 1000mg  BID). Since she was discharged from the hospital, she feels like she is slowly improving but continues to have increased edema.  Was able to walk up and down stairs easily in October, when she had an anniversary party  Since her hospitalization, she has had trouble walking up her steps.   Impression: Ms. Teters is a 72 y.o. 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 She had signs of mild interstitial lung disease on the CAT scan report from 2018. She was started on CellCept at that point and had a significant improvement in the forced vital capacity. Overall, she feels like her breathing has been worse since July 2023. She recently was discharged from [facility] with scleroderma renal crisis. Her creatinine continues to improve, but she still exhibits signs of volume overload both on exam and imaging with a moderate pericardial effusion and small bilateral pleural effusions. Her breathing symptoms have been worse in the context and her pulmonary function tests this week are challenging to interpret with her acute recent hospitalization.  However, she did have PFTs done at [facility] in October, which was prior to her hospitalization that showed a pattern of worsening in terms of her FVC and DLCO. So it clearly appears that she had progressive ILD even before her scleroderma renal crisis. She started back on CellCept 1000 mg twice per day. Dr. Stella) Maree would like her to stay on this dose rather than increase it to 1500 mg twice per day because of her kidney function.  Plan: -Continue CellCept. Agree with the current dosing, but as her creatinine improves I would prefer to get her up to 1500 mg twice per day -Repeat PFTs in 3 months - I note that her CK and inflammatory markers are elevated. This is likely in the context of her renal crisis. I will talk to Dr. Maree about IVIG, although the amount of volume would not be well-tolerated at this point. -Continue furosemide  through the weekend. Her rheumatologist at [facility] is working on getting her a sooner appointment with a nephrologist, who will be directing her diuretic therapy. For now I would suggest getting weekly creatinine values until she can see nephrology   OV 09/05/2022 - new consult = referred by Dr Belvie Silvan  Subjective:  Patient ID: Arland JONELLE Estelle, female , DOB: 01-13-1951 , age 77 y.o. , MRN: [id number] , ADDRESS: [address] [place] [address] PCP Copland, Harlene BROCKS, MD Patient Care Team: Copland, Harlene BROCKS, MD as PCP - General (Family Medicine)  Patel, Donika K, DO as Consulting Physician (Neurology) Mathew Carliss SQUIBB, MD Valera Vola Bucco, MD as Referring Physician (Rheumatology) Layman Mitchell Atlas, MD as Referring Physician (Pulmonary Disease) Cleotilde Ronal RAMAN, MD as Consulting Physician (Gynecology) Maree Llanos, MD as Referring Physician (Internal Medicine) Joshua Blamer, MD as Consulting Physician (Dermatology) Cleotilde Ronal RAMAN, MD as Consulting Physician (Gynecology) Pyrtle, Gordy HERO, MD as Consulting Physician (Gastroenterology) Timmy Maude SAUNDERS, MD as  Medical Oncologist (Oncology)  This Provider for this visit: Treatment Team:  Attending Provider: Geronimo Amel, MD    09/05/2022 -   Chief Complaint  Patient presents with   Consult    Scleroderma      HPI DOTSIE GILLETTE 73 y.o. -history is from the patient, husband acting as independent historian, detailed review of the external medical records [address] and also admissions and also [facility].  The pertinent details from [facility] visit with both rheumatology and pulmonary is detailed above.  Patient is personal friend of Rojelio Silvan who is known to me.  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  She is being followed at [facility].  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  But pulmonary function test has been stable all the way through 2022.  During this time she was maintained on CellCept through Dr.[employer]  Then in early 2023 developed shingles around the left ear and the neck.  Husband did show photographs of this.  During this time CellCept was making the shingles record and poorly healing.  Therefore CellCept had to be stopped.  She was pretty much off CellCept through the course of 2023.  During this time as of October 2023 her pulmonary function decline compared to 2022 but she was actually unaware of this.  Then abruptly in January 2024 she ended up with hypertension and prior to that was having worsening pedal edema].  She was admitted for renal crisis at [facility].  Her baseline creatinine was 1.1 mg percent [November 2023] but on 08/09/2022 it was 1.8 mg percent.  The following day on 08/10/2022 she was transferred to [facility] because of complex medical condition.   She was started on CellCept there she spent 6 days then then discharge.  During this time because of concern of shingles recommend she is being maintained on daily acyclovir.  She is also on amlodipine  and lisinopril .  She is continuing this.  She is also on diuresis.  She is continuing this.  She says she is diuresed well.  Currently blood pressures have improved significantly between 109 - 128 systolic.  Of note while at [facility] she did have CT scan of the chest [I personally visualized today] she actually has ILD although radiologist does not mention this.  She has pericardial effusion both on the echo at [facility] and the CT scan.  This was absent even as late as October 2023.  She followed up with rheumatology and pulmonary at [facility] both Dr. Ronita Maree [rheumatology] and also Dr. Donnice Cancer in pulmonary.  Their notes are documented below but the consensus that she actually has ILD and is actually progressive even before the renal crisis.  The pulmonary function test after renal crisis shows further progression although this could be clouded by all the  volume overload issues.  The supporting CellCept.  It appears nintedanib was not discussed with the patient.  She does not recollect this.  They are concerned about her dysphagia and I recommended endoscopy but given the pericardial effusion want to pulmonary hypertension/cardiac issues stabilized before undergoing any endoscopy there also recommended weekly creatinine.  Her latest creatinine yesterday was 1.9 mg percent.  [Peak creatinine 2.03 mg percent 08/22/2022).  She is trying to get an appointment with nephrology.  I personally spoke to Dr. Fairy: None ordered today over the phone.  Of note she has never had a right heart catheterization. It appears she has not seen [facility] pulmonary/ILD     Past Medical History :   In terms of her breast cancer, she had a left sided lumpectomy and radiation. She is being followed for a possible lesion on  her right breast, but recent testing has been negative (other than biomarkers)  SSc disease characteristics from [facility] Cutaneous subtype: Diffuse Autoantibody profile: RNA pol III Pulmonary involvement: ILD Cardiac involvement: Pericardial effusion GI involvement: Dysphagia Skin/vascular: Raynaud's, Telangiectases, and Sclerodactyly  She did get radiation for breast cancer.   ROS:  -Positive for scleroderma with Raynaud's.  It appears the skin score is worsened -Immunosuppressed status - Shingles in 2023 - Dysphagia - Fatigue - Dry eyes  FAMILY HISTORY of LUNG DISEASE:  -Negative  PERSONAL EXPOSURE HISTORY:  -Denies any smoking or marijuana or vaping or intravenous drug use.  HOME  EXPOSURE and HOBBY DETAILS :  [place]  [place] is lived there for 17 years.  The age of the home is 17 years.  Detail organic and inorganic antigen history is negative.  She is retired.  She is involved with the [employer].  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  Detail organic and inorganic antigen exposure history is negative.  PULMONARY TOXICITY HISTORY (27 items):  X-radiation for breast cancer  INVESTIGATIONS: -GFR of 25.9 with an AST of 43 and a creatinine 1.9 mg percent 09/04/2022.  HRCT 05/20/2017  - at [facility] Per report   1.  Mild lower lobe and peripheral predominant reticulations, minimal traction bronchiectasis, and ground glass opacities with no honeycombing. Findings representing probable UIP pattern as per Fleischner criteria. 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 2.  Interval development of small bilateral pleural effusions and a small pericardial effusion. 3.  Interval increase in anterior left lower lobe pleural thickening and subpleural reticulation suggestive of post radiation changes. Is only seen on prior examination. 4.  There  is a nonspecific 2 mm nodule in the right upper lobe which is not seen on prior examination likely due to different technique. Attention on follow-up examination as per clinical protocol is recommended.  Electronically Signed by:  Oris Schick, MD, [facility] Electronically Signed on:  05/20/2017 2:04 PM Procedure Note  Schick Oris, MD - 05/20/2017 Formatting of this note might be different from the original. Chest CT  Indication: Interstitial lung disease, Scleroderma involving lung , M34.9 Systemic sclerosis, unspecified (CMS-HCC).  Comparison: January 21, 2017   CT Chest data wo contrst Jan 2024 -personally visualized.  My personal interpretation is that there is ILD.  ILD is also been reported in the subsequent high-resolution CT chest at [facility]  Narrative & Impression  CLINICAL DATA:  Shortness of breath.  History of breast cancer.   EXAM: CT CHEST WITHOUT CONTRAST   TECHNIQUE: Multidetector  CT imaging of the chest was performed following the standard protocol without IV contrast.   RADIATION DOSE REDUCTION: This exam was performed according to the departmental dose-optimization program which includes automated exposure control, adjustment of the mA and/or kV according to patient size and/or use of iterative reconstruction technique.   COMPARISON:  August 09, 2022.  February 28, 2022.   FINDINGS: Cardiovascular: No evidence of thoracic aortic aneurysm. Mild cardiomegaly is noted. Mild to moderate pericardial effusion is noted.   Mediastinum/Nodes: No enlarged mediastinal or axillary lymph nodes. Thyroid  gland, trachea, and esophagus demonstrate no significant findings.   Lungs/Pleura: Small bilateral pleural effusions are noted. Minimal bibasilar pulmonary edema or subsegmental atelectasis is noted. No pneumothorax is noted.   Upper Abdomen: No acute abnormality.   Musculoskeletal: Stable old left rib fractures are noted. No acute osseous abnormality is  noted.   IMPRESSION: Mild to moderate size pericardial effusion is now noted.   Small bilateral pleural effusions are noted with associated minimal bibasilar pulmonary edema or subsegmental atelectasis.     Electronically Signed   By: Lynwood Landy Raddle M.D.   On: 08/10/2022 14:47      CT CHEST HRCT 08/26/22 at [facility] -this was done at [facility].  I personally could not visualize this because the upload system would not work.  IMPRESSION: 1.  Interstitial lung disease/fibrosis. Nonspecific patchy bilateral groundglass opacities. 2.  Moderate pericardial effusion. 3.  Small left pleural effusion, trace right pleural effusion. 4.  Mediastinal adenopathy.  Images and interpretation personally reviewed by: Oliva VEAR Mt, MD Exam End: 08/26/22 16:43   Specimen Collected: 08/26/22 16:45 Last Resulted: 08/26/22 16:49  Received From: [facility]    ECHO 08/10/22 (no effusion in Oct 2023)   Sonographer Comments: Image acquisition challenging due to breast  implants.  IMPRESSIONS     1. Left ventricular ejection fraction, by estimation, is 60 to 65%. The  left ventricle has normal function. The left ventricle has no regional  wall motion abnormalities. There is mild left ventricular hypertrophy of  the basal-septal segment. Left  ventricular diastolic parameters are consistent with Grade I diastolic  dysfunction (impaired relaxation).   2. Right ventricular systolic function is normal. The right ventricular  size is normal.   3. Moderate pericardial effusion. There is no evidence of cardiac  tamponade.   4. The mitral valve is normal in structure. Trivial mitral valve  regurgitation. No evidence of mitral stenosis.   5. The aortic valve is tricuspid. Aortic valve regurgitation is trivial.  No aortic stenosis is present.   6. The inferior vena cava is normal in size with greater than 50%  respiratory variability, suggesting right atrial pressure of 3 mmHg.    Comparison(s): No prior Echocardiogram.    OV 10/24/2022  Subjective:  Patient ID: Arland JONELLE Bunker, female , DOB: June 13, 1951 , age 84 y.o. , MRN: [id number] , ADDRESS: [address] [place] [address] PCP Copland, Harlene BROCKS, MD Patient Care Team: Copland, Harlene BROCKS, MD as PCP - General (Family Medicine) Tobie Tonita POUR, DO as Consulting Physician (Neurology) Mathew Carliss SQUIBB, MD Valera Vola Bucco, MD as Referring Physician (Rheumatology) Layman Mitchell Atlas, MD as Referring Physician (Pulmonary Disease) Cleotilde Ronal RAMAN, MD as Consulting Physician (Gynecology) Maree Llanos, MD as Referring Physician (Internal Medicine) Joshua Blamer, MD as Consulting Physician (Dermatology) Cleotilde Ronal RAMAN, MD as Consulting Physician (Gynecology) Pyrtle, Gordy HERO, MD as Consulting Physician (Gastroenterology) Timmy Maude JONELLE, MD as Medical Oncologist (Oncology)  This Provider for  this visit: Treatment Team:  Attending Provider: Geronimo Amel, MD    10/24/2022 -   Chief Complaint  Patient presents with   Follow-up    Review PFT from today   #Scleroderma systemic # ILD associated with scleroderma workup and evaluation progress # Scleroderma renal and respiratory crisis late 2023/early 2024 following hold on CellCept due to shingles  HPI PAIRLEE SAWTELL 73 y.o. -returns for follow-up.  She presents with her husband.  Husband is an independent historian today.  The following is and amalgamation of her information and review of the chart and also the husband.  I saw her 2 months ago.  Several issues at this visit  -Renal: She has now established with our local nephrology practice with Dr. Rayburn.  She saw his nurse practitioner yesterday.  Apparently her creatinine has improved.  Most recent one end of March 2024 was 1.39 mg percent.  She believes it is improved because she is back on CellCept for her scleroderma and also she is on antihypertensives  -Blood pressure: She tried  Norvasc  2.5 mg along with lisinopril .  She felt the combination made her hypotensive.  She attributed most of the drop to the 2.5 mg Norvasc .  But even on full dose lisinopril  40 mg/day her blood pressure was still on the lower side.  She says she gets fatigued with the blood pressure is less than 108 systolic and does better when the blood pressure is over 118 systolic.  Therefore she is reduced her lisinopril  to 20 mg/day and with this the blood pressure is in a better range and she is feeling better  -New issue swollen tongue: She has been noticing that her tongue has been swollen for the last few weeks.  This is definitely after starting lisinopril  but she is not associating this with lisinopril .  There is no angioedema of the lips.  When I examined her tongue it looked normal but she says it is swollen.  Swallowing pills has become harder.  I did indicate to her that swollen tongue is a side effect of lisinopril .  We discussed about Norvasc  being a better agent for blood pressure control particularly with her and not.  However she feels that she may be sensitive to the Norvasc .  She is also worried about stopping her lisinopril  due to fear of renal compromise.  Finally took a shared decision making for her to stop lisinopril  for 1 week and see if the swollen tongue will improve.  Also indicated to her that she could continue the lisinopril  but continue to monitor her tongue  -Cough: This is significantly improved after reducing the lisinopril  and stopping Valtrex .  But she still has cough that when it happens is at least moderate in severity   -Shingles: She stopped the Valtrex  because of cough and has been no recurrence in shingles.  She is also back on the CellCept.  The situation is being monitored  -Scleroderma: She does not have a local rheumatologist.  Rheumatology support is through Dr. Maree at [facility] and also Dr. Fredrica at [facility].  Dr. Fredrica and I plan to have a conference call on  10/25/2022.  She is currently on CellCept 50 mg twice daily and is tolerating it well  # ILD: She never been given a formal diagnosis of ILD but she does have it.  I had Dr. Rea Marc review her CT scan of the chest.  She agrees that patient has ILD at least at the 10-20% level.  There is also some pulmonary edema.  Currently most recent pulmonary function test is improved significantly but the overall trend Is 1 of decline particularly this happene afterd she stopped her CellCept in 2023 (see below].  I did indicate to her nintedanib is indicated.  She told me she is sensitive to medications.  I went over the side effect profile of diarrhea, liver function test monitoring rare heart attack diverticulitis.  We took a shared decision making to start the low-dose but pending conversation with Dr. Fredrica at [facility]. ->  The following day on Friday I did speak to his Dr. Fredrica at [facility].  He wants to hold off on the nintedanib pending his conversation with the rheumatologist at [facility].  They want to consider IVIG to improve lung function further before starting patient on nintedanib.  He is going to get back to me.  # At risk pulmonary hypertension:-Dr. Vina to do a right heart catheterization there is no tamponade.  Main pulmonary artery pressure was 24 suggestive and consistent with very mild pulmonary hypertension..For treatment for WHO group 3 pulmonary hypertension his mean pulmonary pressure 25 which is just 1 point higher.  Given the pressing issue of ILD decided to postpone pulmonary hypertension treatment.  I will confirm alignment with Dr. Fredrica at [facility] -> I did talk to him on the phone following this.  He is supportive and aligned with this plan.  #Pericardial effusion: Dr. Vina feels it is inflammatory and will use with treatment this should resolve.   P Preliminary result   [facility] MID- MAR 2024   Findings:   RA = 6 RV = 39/12 PA = 39/10 (24) PCW = 6 Fick cardiac  output/index = 5.7/3.4 PVR = 3.2 WU Ao sat = 99% PA sat = 74%, 77% SVC sat 75%   Assessment:   Very mild PAH.    Plan/Discussion:    Given scleroderma, will discuss trial of ERA or Tyvaso with Dr. Geronimo.   Toribio Fuel, MD   OV 11/12/2022  Subjective:  Patient ID: Arland JONELLE Bunker, female , DOB: 1951/05/13 , age 10 y.o. , MRN: [id number] , ADDRESS: [address] [place] [address] PCP Copland, Harlene BROCKS, MD Patient Care Team: Copland, Harlene BROCKS, MD as PCP - General (Family Medicine) Tobie Tonita POUR, DO as Consulting Physician (Neurology) Mathew Carliss SQUIBB, MD Valera Vola Bucco, MD as Referring Physician (Rheumatology) Layman Mitchell Atlas, MD as Referring Physician (Pulmonary Disease) Cleotilde Ronal RAMAN, MD as Consulting Physician (Gynecology) Maree Llanos, MD as Referring Physician (Internal Medicine) Joshua Blamer, MD as Consulting Physician (Dermatology) Cleotilde Ronal RAMAN, MD as Consulting Physician (Gynecology) Pyrtle, Gordy HERO, MD as Consulting Physician (Gastroenterology) Timmy Maude JONELLE, MD as Medical Oncologist (Oncology)  This Provider for this visit: Treatment Team:  Attending Provider: Geronimo Amel, MD    11/12/2022 -   Chief Complaint  Patient presents with   Follow-up    F/up     Type of visit: Video Virtual Visit Identification of patient ALLEAH DEARMAN with Nov 09, 1950 and MRN [id number] - 2 person identifier Risks: Risks, benefits, limitations of telephone visit explained. Patient understood and verbalized agreement to proceed Anyone else on call: patient Patient location: her home This provider location: [address]; [place]; [place] [address]. [facility]. [phone]     HPI TIKA HANNIS 73 y.o. -patient on video visit to catch up  SSc/ILD - stable  / Dw Dr Algie Maree of [facility] over telephine ->  she is worried about myopathy. her CK continues to be high.  It is in the 500s.And is recommending  IvIG. Her impression is that Dr Daril Fairly at [facility] has made plans to get in [place] through her patient hematologist. DR Fairly is asking for repeat FVC and MIP/NIF.  Patient is going to call [employer] and figure this out.   Tongue swelling: still remain on lisinprol 20mg  per day . Was advised to strt B12 and this helped. This was based on based on advise given by daughter friends.  BP: She is off norvasc . Is now on lisinopril  20mg  per day. SBP 91 and sometimes sbp 80s  . Scared to cut down on lisinopril  because of beneifical effects to kidney. Says low BP is making her ddizzy and fatigued and difficult to carry on.d/w Dr Ronita Fairly o  [facility] via phone =- > she said for us  to take BP and if still low -> then reduce lisinopril  to 10mg  per day  Renal: creat 1.38g% on 10/28/22 but after that with [facility] is 1.34mg %   Left diaph - sniff test: Normal      OV 07/22/2023  Subjective:  Patient ID: Arland JONELLE Bunker, female , DOB: [birth year] , age 64 y.o. , MRN: [id number] , ADDRESS: [address] [place] [address] PCP Copland, Harlene BROCKS, MD Patient Care Team: Copland, Harlene BROCKS, MD as PCP - General (Family Medicine) Tobie Tonita POUR, DO as Consulting Physician (Neurology) Mathew Carliss SQUIBB, MD Valera Vola Bucco, MD as Referring Physician (Rheumatology) Layman Mitchell Atlas, MD as Referring Physician (Pulmonary Disease) Cleotilde Ronal RAMAN, MD as Consulting Physician (Gynecology) Fairly Daril, MD as Referring Physician (Internal Medicine) Joshua Blamer, MD as Consulting Physician (Dermatology) Cleotilde Ronal RAMAN, MD as Consulting Physician (Gynecology) Pyrtle, Gordy HERO, MD as Consulting Physician (Gastroenterology) Timmy Maude JONELLE, MD as Medical Oncologist (Oncology)  This Provider for this visit: Treatment Team:  Attending Provider: Geronimo Amel, MD    07/22/2023 -   Chief Complaint  Patient presents with   Follow-up    Pt states she has no concerns been doing good.     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 -returns with her husband Zell.  Last seen in the spring 2024.  After that she is followed up at [facility] and [place].  D saw her primary scleroderma providers.  She has been started on IVIG.  She gets it every 4 weeks.  She gets it locally through Dr. Maude and ever at [facility] which is closer to her house.  She is finished 7 cycles.  She gets it every 4 weeks.  The next cycle of cycle #8 and then extending it to every 6 weeks where she will get 3 cycles like that and then go to every 8 weeks.  She says she is feeling remarkably better.  She has got no shortness of breath.  Symptom scores are 0.  She is working with physical therapy at home with dumbbells and other weights.  Her mobility is much better.  [Of note she used to just exercise and at some point she is hoping to return to work].  She  is followed up at [facility] and she had pulmonary function test that shows the [facility] is improving.  Recently her CK is also converted to normal as of November 2024.  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  She is unsure about her pulmonary fibrosis status but she does not have any shortness of breath and she did not desaturate.  She still has some crackles though on exam.  Her last CT scan was in February 2024 at [facility].  We have resolved that she will see me back in 6 months with pulmonary function test and then we can decide on CT scan.  Should also discussed the history of pulmonary fibrosis with the [facility] and [employer].  In case there is progression I did not explain to her that antifibrotic's or clinical trials are always care options.  At this point  in time I do not recommend those.  She continues to have intermittent dysphagia.  She recently saw Dr. JINNY Starch.  She is going to see him again soon.  She did have a swallow study.     SYMPTOM SCALE - ILD 09/05/2022 07/22/2023 Cellcept Iv IG  Current weight    O2 use ra ra  Shortness of Breath 0 -> 5 scale with 5 being worst (score 6 If unable to do)   At rest 1 0  Simple tasks - showers, clothes change, eating, shaving 1 0  Household (dishes, doing bed, laundry) 2 0  Shopping x 0  Walking level at own pace 1 0  Walking up Stairs 2 0  Total (30-36) Dyspnea Score 7 0      Non-dyspnea symptoms (0-> 5 scale) 09/05/2022 07/22/2023   How bad is your cough? 3 1  How bad is your fatigue 3 0  How bad is nausea 3 0  How bad is vomiting?  0 0  How bad is diarrhea? 0 0  How bad is anxiety? 0 0  How bad is depression 0 0  Any chronic pain - if so where and how bad 1 0  0   Simple office walk 185 feet x  3 laps goal with forehead probe 09/05/2022  10/24/2022   O2 used ra ra  Number laps completed 3 Sit stana x 15  Comments about pace pace   Resting Pulse Ox/HR 100% and 87/min 100% and HR 78  Final Pulse Ox/HR 97% and 107/min 99% and Hr 91  Desaturated </= 88% no   Desaturated <= 3% points yes   Got Tachycardic >/= 90/min yes   Symptoms at end of test x   Miscellaneous comments x        PFT data for ILD 12/23/2017 2.25/2020 03/07/2020 11/14/20 [place] 05/15/21 05/14/2022 at [facility]  08/27/22 at [facility]  In the context of volume overload and hospitalization 10/23/2022  [facility] 12/11/22 [facility] 04/08/23  FVC 2.82 2.92 2.88 2.77 2.75 2.05 1.59 1.73 2.02L 2.18L  FVC %      73% 60% 58%    Ratio      77 76     TLC   3.93  4.34 3.09 2.41     TLC %      62% 51%     DLCO 19.2 10.67 11.68 12.29 12.82 10.2 5.38 9.27    DLCO %      52% 29% 47%      PFT    Latest Ref Rng & Units 11/29/2022    3:03 PM 10/23/2022  4:24 PM  PFT Results  FVC-Pre L 1.99  1.73    FVC-Predicted Pre % 68  58   Pre FEV1/FVC % % 80  85   FEV1-Pre L 1.59  1.47   FEV1-Predicted Pre % 73  65   DLCO uncorrected ml/min/mmHg 7.58  8.78   DLCO UNC% % 39  44   DLCO corrected ml/min/mmHg  9.27   DLCO COR %Predicted %  47   DLVA Predicted % 66  81   TLC L 3.28    TLC % Predicted % 64    RV % Predicted % 63         LAB RESULTS last 96 hours No results found.   LAB RESULTS last 90 days Recent Results (from the past 2160 hours)  C-reactive protein     Status: None   Collection Time: 05/19/23  9:30 AM  Result Value Ref Range   CRP 0.6 <1.0 mg/dL    Comment: Performed at [facility], [address] [place], [place] [address]  Troponin I (High Sensitivity)     Status: None   Collection Time: 05/19/23  9:30 AM  Result Value Ref Range   Troponin I (High Sensitivity) 9 <18 ng/L    Comment: (NOTE) Elevated high sensitivity troponin I (hsTnI) values and significant  changes across serial measurements may suggest ACS but many other  chronic and acute conditions are known to elevate hsTnI results.  Refer to the Links section for chest pain algorithms and additional  guidance. Performed at [facility], [address] [place], [place] [address]   CK, total     Status: None   Collection Time: 05/19/23  9:30 AM  Result Value Ref Range   Total CK 167 38 - 234 U/L    Comment: Performed at [facility], [address] [place], [place] [address]  CBC with Differential (Cancer Center Only)     Status: Abnormal   Collection Time: 05/19/23  9:30 AM  Result Value Ref Range   WBC Count 5.3 4.0 - 10.5 K/uL   RBC 3.32 (L) 3.87 - 5.11 MIL/uL   Hemoglobin 9.7 (L) 12.0 - 15.0 g/dL   HCT 68.6 (L) 63.9 - 53.9 %   MCV 94.3 80.0 - 100.0 fL   MCH 29.2 26.0 - 34.0 pg   MCHC 31.0 30.0 - 36.0 g/dL   RDW 85.3 88.4 - 84.4 %   Platelet Count 245 150 - 400 K/uL   nRBC 0.0 0.0 - 0.2 %   Neutrophils Relative % 76 %   Neutro Abs 4.0 1.7 - 7.7 K/uL    Lymphocytes Relative 12 %   Lymphs Abs 0.7 0.7 - 4.0 K/uL   Monocytes Relative 9 %   Monocytes Absolute 0.5 0.1 - 1.0 K/uL   Eosinophils Relative 2 %   Eosinophils Absolute 0.1 0.0 - 0.5 K/uL   Basophils Relative 1 %   Basophils Absolute 0.0 0.0 - 0.1 K/uL   Immature Granulocytes 0 %   Abs Immature Granulocytes 0.02 0.00 - 0.07 K/uL    Comment: Performed at [facility], [address] [place], [place] [address]  Sedimentation rate     Status: Abnormal   Collection Time: 05/19/23  9:30 AM  Result Value Ref Range   Sed Rate 51 (H) 0 - 22 mm/hr    Comment: Performed at [facility], [address] [place], [place] [address]  Lactate  dehydrogenase     Status: Abnormal   Collection Time: 05/19/23  9:30 AM  Result Value Ref Range   LDH 199 (H) 98 - 192 U/L    Comment: Performed at [facility] at [facility], [address], [place], [place] [address]  Cancer antigen 27.29     Status: None   Collection Time: 05/19/23  9:30 AM  Result Value Ref Range   CA 27.29 35.8 0.0 - 38.6 U/mL    Comment: (NOTE) Siemens Centaur Immunochemiluminometric Methodology (ICMA) Values obtained with different assay methods or kits cannot be used interchangeably. Results cannot be interpreted as absolute evidence of the presence or absence of malignant disease. Performed At: [facility] [address] [place], [place] [id number] Jennette Shorter MD Ey:[id number]   Aldolase     Status: None   Collection Time: 05/19/23  9:30 AM  Result Value Ref Range   Aldolase 5.6 3.3 - 10.3 U/L    Comment: (NOTE) Performed At: [facility] [address] [place], [place] [id number] Jennette Shorter MD Ey:[id number]   CMP (Cancer Center only)     Status: Abnormal   Collection Time: 05/19/23  9:30 AM  Result Value Ref Range   Sodium 137 135 - 145 mmol/L   Potassium 3.6 3.5 - 5.1 mmol/L   Chloride 103 98 - 111 mmol/L   CO2 27 22 - 32 mmol/L    Glucose, Bld 98 70 - 99 mg/dL    Comment: Glucose reference range applies only to samples taken after fasting for at least 8 hours.   BUN 18 8 - 23 mg/dL   Creatinine 8.95 (H) 9.55 - 1.00 mg/dL   Calcium  9.2 8.9 - 10.3 mg/dL   Total Protein 7.2 6.5 - 8.1 g/dL   Albumin  4.2 3.5 - 5.0 g/dL   AST 25 15 - 41 U/L   ALT 12 0 - 44 U/L   Alkaline Phosphatase 56 38 - 126 U/L   Total Bilirubin 0.5 <1.2 mg/dL   GFR, Estimated 56 (L) >60 mL/min    Comment: (NOTE) Calculated using the CKD-EPI Creatinine Equation (2021)    Anion gap 7 5 - 15    Comment: Performed at [facility] at [facility], [address] [place], [place] [address]  TSH     Status: Abnormal   Collection Time: 05/22/23  9:54 AM  Result Value Ref Range   TSH 0.095 (L) 0.350 - 4.500 uIU/mL    Comment: Performed by a 3rd Generation assay with a functional sensitivity of <=0.01 uIU/mL. Performed at [facility], [address], [place], [place] [address]   Sedimentation rate     Status: Abnormal   Collection Time: 06/20/23  8:10 AM  Result Value Ref Range   Sed Rate 60 (H) 0 - 22 mm/hr    Comment: Performed at [facility], [address] [place], [place] [address]  C-reactive protein     Status: None   Collection Time: 06/20/23  8:10 AM  Result Value Ref Range   CRP 0.6 <1.0 mg/dL    Comment: Performed at [facility], [address], [place], [place] [address]  Kappa/lambda light chains     Status: None   Collection Time: 06/20/23  8:10 AM  Result Value Ref Range   Kappa free light chain 16.3 3.3 - 19.4 mg/L   Lambda free light chains 12.6 5.7 - 26.3 mg/L  Kappa, lambda light chain ratio 1.29 0.26 - 1.65    Comment: (NOTE) Performed At: [facility] [address] [place], [place] [id number] Jennette Shorter MD Ey:[id number]   IgG, IgA, IgM     Status: None   Collection Time: 06/20/23  8:10 AM  Result Value Ref Range   IgG (Immunoglobin G),  Serum 1,358 586 - 1,602 mg/dL   IgA 870 64 - 577 mg/dL   IgM (Immunoglobulin M), Srm 53 26 - 217 mg/dL    Comment: (NOTE) Performed At: [facility] [address] [place], [place] [id number] Jennette Shorter MD Ey:[id number]   TSH     Status: None   Collection Time: 06/20/23  8:10 AM  Result Value Ref Range   TSH 0.654 0.350 - 4.500 uIU/mL    Comment: Performed by a 3rd Generation assay with a functional sensitivity of <=0.01 uIU/mL. Performed at [facility], [address] [place], [place] [address]   Aldolase     Status: None   Collection Time: 06/20/23  8:10 AM  Result Value Ref Range   Aldolase 4.4 3.3 - 10.3 U/L    Comment: (NOTE) Performed At: [facility] [address] [place], [place] [id number] Jennette Shorter MD Ey:[id number]   CK, total     Status: None   Collection Time: 06/20/23  8:10 AM  Result Value Ref Range   Total CK 102 38 - 234 U/L    Comment: Performed at [facility], [address] [place], [place] [address]  Iron and Iron Binding Capacity ([facility] only)     Status: None   Collection Time: 06/20/23  8:10 AM  Result Value Ref Range   Iron 75 28 - 170 ug/dL   TIBC 719 749 - 549 ug/dL   Saturation Ratios 27 10.4 - 31.8 %   UIBC 205 148 - 442 ug/dL    Comment: Performed at [facility], [address] [place], [place] [address]  Ferritin     Status: None   Collection Time: 06/20/23  8:10 AM  Result Value Ref Range   Ferritin 97 11 - 307 ng/mL    Comment: Performed at [facility], [address] [place], [place] [address]  Reticulocytes     Status: Abnormal   Collection Time: 06/20/23  8:10 AM  Result Value Ref Range   Retic Ct Pct 1.6 0.4 - 3.1 %   RBC. 3.08 (L) 3.87 - 5.11 MIL/uL   Retic Count, Absolute 49.6 19.0 - 186.0 K/uL   Immature Retic Fract 7.8 2.3 - 15.9 %    Comment: Performed at [facility] at [facility], [address] [place], [place] [address]  Cancer antigen 27.29     Status: Abnormal   Collection Time: 06/20/23  8:10 AM  Result Value Ref Range   CA 27.29 48.9 (H) 0.0 - 38.6 U/mL    Comment: (NOTE) Siemens Centaur Immunochemiluminometric Methodology (ICMA) Values obtained with different assay methods or kits cannot be used interchangeably. Results cannot be interpreted as absolute evidence of the presence or absence of malignant disease. Performed At: [facility] [address] [place], [place] [id number] Jennette Shorter MD Ey:[id number]   Lactate dehydrogenase     Status: None   Collection Time: 06/20/23  8:10 AM  Result Value Ref Range   LDH 172 98 - 192 U/L    Comment: Performed at [facility] at [facility]  [address], [address], [place], [place] [address]  CMP (Cancer Center only)     Status: Abnormal   Collection Time: 06/20/23  8:10 AM  Result Value Ref Range   Sodium 132 (L) 135 - 145 mmol/L   Potassium 3.4 (L) 3.5 - 5.1 mmol/L   Chloride 102 98 - 111 mmol/L   CO2 22 22 - 32 mmol/L   Glucose, Bld 102 (H) 70 - 99 mg/dL    Comment: Glucose reference range applies only to samples taken after fasting for at least 8 hours.   BUN 19 8 - 23 mg/dL   Creatinine 8.84 (H) 9.55 - 1.00 mg/dL   Calcium  8.7 (L) 8.9 - 10.3 mg/dL   Total Protein 6.7 6.5 - 8.1 g/dL   Albumin  3.4 (L) 3.5 - 5.0 g/dL   AST 25 15 - 41 U/L   ALT 14 0 - 44 U/L   Alkaline Phosphatase 55 38 - 126 U/L   Total Bilirubin 0.4 <1.2 mg/dL   GFR, Estimated 50 (L) >60 mL/min    Comment: (NOTE) Calculated using the CKD-EPI Creatinine Equation (2021)    Anion gap 8 5 - 15    Comment: Performed at [facility], [address] [place], [place] [address]  CBC with Differential (Cancer Center Only)     Status: Abnormal   Collection Time: 06/20/23  8:10 AM  Result Value Ref Range   WBC Count 7.6 4.0 - 10.5 K/uL   RBC 3.18 (L) 3.87 - 5.11 MIL/uL   Hemoglobin 9.7 (L) 12.0 - 15.0  g/dL   HCT 69.9 (L) 63.9 - 53.9 %   MCV 94.3 80.0 - 100.0 fL   MCH 30.5 26.0 - 34.0 pg   MCHC 32.3 30.0 - 36.0 g/dL   RDW 85.7 88.4 - 84.4 %   Platelet Count 247 150 - 400 K/uL   nRBC 0.0 0.0 - 0.2 %   Neutrophils Relative % 84 %   Neutro Abs 6.3 1.7 - 7.7 K/uL   Lymphocytes Relative 8 %   Lymphs Abs 0.6 (L) 0.7 - 4.0 K/uL   Monocytes Relative 6 %   Monocytes Absolute 0.5 0.1 - 1.0 K/uL   Eosinophils Relative 2 %   Eosinophils Absolute 0.1 0.0 - 0.5 K/uL   Basophils Relative 0 %   Basophils Absolute 0.0 0.0 - 0.1 K/uL   Immature Granulocytes 0 %   Abs Immature Granulocytes 0.03 0.00 - 0.07 K/uL    Comment: Performed at [facility] at [facility], [address], [place], [place] [address]  Troponin I (High Sensitivity)     Status: None   Collection Time: 06/20/23  8:10 AM  Result Value Ref Range   Troponin I (High Sensitivity) 7 <18 ng/L    Comment: (NOTE) Elevated high sensitivity troponin I (hsTnI) values and significant  changes across serial measurements may suggest ACS but many other  chronic and acute conditions are known to elevate hsTnI results.  Refer to the Links section for chest pain algorithms and additional  guidance. Performed at [facility], [address] [place], [place] [address]          has a past medical history of Anemia, Breastr cancer, IDC, Left UOQ, clinical stage II Receptor +, Her 2 - (08/29/2011 DX), Chronic renal failure (CRF), stage 3a (HCC) (11/18/2022), Colon polyp, Contracture of hand, Depression, Esophageal stricture, GERD (gastroesophageal reflux disease), Hiatal hernia, History of external  beam radiation therapy (11-25-2011 to 01-08-2012), Hyperlipidemia, Hypertension, Hypothyroidism, Internal hemorrhoids, Osteoarthritis, Osteopenia, Positive ANA (antinuclear antibody), Rash, Raynaud's syndrome, Scleroderma involving lung (HCC) (pulmologist-  dr h. layman at [facility]), Skin thickening, Systemic  sclerosis (HCC), and UTI (urinary tract infection).   reports that she has never smoked. She has never used smokeless tobacco.  Past Surgical History:  Procedure Laterality Date   BIOPSY  01/29/2018   Procedure: BIOPSY;  Surgeon: Albertus Gordy HERO, MD;  Location: [facility];  Service: Gastroenterology;;   ROMAYNE  2000   Left   COLONOSCOPY WITH PROPOFOL  N/A 01/29/2018   Procedure: COLONOSCOPY WITH PROPOFOL ;  Surgeon: Albertus Gordy HERO, MD;  Location: [facility];  Service: Gastroenterology;  Laterality: N/A;   DILATATION & CURETTAGE/HYSTEROSCOPY WITH MYOSURE N/A 08/04/2017   Procedure: DILATATION & CURETTAGE/HYSTEROSCOPY WITH MYOSURE;  Surgeon: Cleotilde Ronal RAMAN, MD;  Location: [facility];  Service: Gynecology;  Laterality: N/A;   ECTOPIC PREGNANCY SURGERY  1986-88   s/p unilateral salpingectomy   ESOPHAGOGASTRODUODENOSCOPY (EGD) WITH PROPOFOL  N/A 01/29/2018   Procedure: ESOPHAGOGASTRODUODENOSCOPY (EGD) WITH PROPOFOL ;  Surgeon: Albertus Gordy HERO, MD;  Location: [facility];  Service: Gastroenterology;  Laterality: N/A;   IR IMAGING GUIDED PORT INSERTION  12/02/2022   PARTIAL MASTECTOMY WITH AXILLARY SENTINEL LYMPH NODE BIOPSY Left 11-04-2011   dr merrilyn  [facility]   RIGHT HEART CATH N/A 10/01/2022   Procedure: RIGHT HEART CATH;  Surgeon: Cherrie Toribio SAUNDERS, MD;  Location: [facility] INVASIVE CV LAB;  Service: Cardiovascular;  Laterality: N/A;   TRANSTHORACIC ECHOCARDIOGRAM  06-04-2017    [facility]   moderate LVH, ef>55%/  trivial AR and TR/ mild MR and PR/  trivial pericardial effusion   WISDOM TOOTH EXTRACTION      Allergies  Allergen Reactions   Betadine [Povidone Iodine] Rash   Epinephrine Other (See Comments)    Severe headaches    Iodinated Contrast Media Other (See Comments) and Rash    per pt: recently had CT 08/ 2018 even through given pre-medication, IVP still caused rash  per pt recently had CT 08/ 2018 even through given pre-medication, IVP still caused rash   Iodine Rash and  Other (See Comments)   Oxycodone  Anxiety   Prednisone  Anaphylaxis    Per Nephrology pt should avoid this medication because she has Scleroderma and it will interfere with her Kidneys,    Gabapentin Other (See Comments)    Dizziness   Lidocaine  Rash   Nickel Rash and Other (See Comments)    Rash and blisters   Other Other (See Comments) and Rash    Hospital gown   Penicillins Rash and Other (See Comments)    Rash and blisters Has patient had a PCN reaction causing immediate rash, facial/tongue/throat swelling, SOB or lightheadedness with hypotension: Yes Has patient had a PCN reaction causing severe rash involving mucus membranes or skin necrosis: No Has patient had a PCN reaction that required hospitalization: No Has patient had a PCN reaction occurring within the last 10 years: No If all of the above answers are NO, then may proceed with Cephalosporin use.    Sulfa Antibiotics Rash    Immunization History  Administered Date(s) Administered   Tdap 04/12/2008, 12/28/2018   Zoster, Live 08/16/2011    Family History  Problem Relation Age of Onset   Heart disease Mother    Heart failure Mother    Heart disease Father    Ovarian cancer Maternal Aunt    Heart disease Paternal Uncle  multiple   Autoimmune disease Sister    Arrhythmia Sister    Anemia Sister    CAD Brother    Colon cancer Neg Hx    Stomach cancer Neg Hx    Esophageal cancer Neg Hx    Rectal cancer Neg Hx      Current Outpatient Medications:    acetaminophen  (TYLENOL ) 650 MG CR tablet, Take 650 mg by mouth daily as needed for pain., Disp: , Rfl:    diphenhydrAMINE  (BENADRYL ) 25 mg capsule, Take 25 mg by mouth every 6 (six) hours as needed for allergies. Allergies and before IVIG treatment., Disp: , Rfl:    ELDERBERRY PO, Take 1 oz by mouth 4 (four) times daily as needed. Liquid, Disp: , Rfl:    furosemide  (LASIX ) 20 MG tablet, Take 1 tablet (20 mg total) by mouth daily., Disp: 90 tablet, Rfl: 1    levothyroxine  (SYNTHROID ) 100 MCG tablet, Take 1 tablet (100 mcg total) by mouth daily before breakfast., Disp: 30 tablet, Rfl: 2   losartan  (COZAAR ) 25 MG tablet, Take 25 mg by mouth in the morning and at bedtime., Disp: , Rfl:    Multiple Vitamin (MULTIVITAMIN) capsule, Take 1 capsule by mouth daily., Disp: , Rfl:    mycophenolate (CELLCEPT) 500 MG tablet, Take 1,500 mg by mouth 2 (two) times daily., Disp: , Rfl:    pantoprazole  (PROTONIX ) 40 MG tablet, Take 40 mg by mouth 2 (two) times daily., Disp: , Rfl:    sodium chloride  (OCEAN) 0.65 % SOLN nasal spray, Place 1 spray into both nostrils as needed for congestion., Disp: , Rfl:       Objective:   Vitals:   07/22/23 1500  BP: 114/60  Pulse: 82  SpO2: 94%  Weight: 125 lb 9.6 oz (57 kg)  Height: 5' 4 (1.626 m)    Estimated body mass index is 21.56 kg/m as calculated from the following:   Height as of this encounter: 5' 4 (1.626 m).   Weight as of this encounter: 125 lb 9.6 oz (57 kg).  @WEIGHTCHANGE @  [employer]   07/22/23 1500  Weight: 125 lb 9.6 oz (57 kg)     Physical Exam   General: No distress. Looks well  O2 at rest: no Cane present: no Sitting in wheel chair: no Frail: no Obese: no Neuro: Alert and Oriented x 3. GCS 15. Speech normal Psych: Pleasant Resp:  Barrel Chest - no.  Wheeze - no, Crackles - YES BASE, No overt respiratory distress CVS: Normal heart sounds. Murmurs - no Ext: Stigmata of Connective Tissue Disease -  SCLERODERMA HEENT: Normal upper airway. PEERL +. No post nasal drip        Assessment:       ICD-10-CM   1. ILD (interstitial lung disease) (HCC)  J84.9     2. 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 Elevated CK  R74.8          Plan:     Patient Instructions     ICD-10-CM   1. ILD (interstitial lung disease) (HCC)  J84.9     2. 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 Elevated CK  R74.8        Scleroderma interstitial lung disease  -Clinically  stable since restart of CellCept - PFT improved at [facility] - feeling stable  Plan  - CellCept directivee by [facility] and [facility] - keep appt with them March 2025 - do spiro and dlco  here in 6 months  - can decide on CT based on these results (last feb 2024 at [facility])  - if there is progression there is oral ofev and inhaled clinical trials available  Myopathy/elevated CK  - IVIG given locally in [facility] through your hematologist as directed by [facility]  - this is helping you and CK dec 2024 is normal  - noted plan for reduced frequency of IvIG dosing  Plan  - CK and PFT monitoring through [facility] and [place] with blood work through Dr Gladis her locally  Acute scleroderma renal crisis [facility]) -January 2024 in the setting of stopping CellCept because of shingles   -Creatinine in dec 2024 is1.15mg  % and significantly better since early 2024  Plan -According to renal    Follow-up -6 months face-to-face visit with Dr. Geronimo ; 30 min   FOLLOWUP Return in about 6 months (around 01/19/2024) for 30 min visit, after Spiro and DLCO, ILD, with Dr Geronimo.  (Level 04 E&M 2024: Estb >= 30 min   visit type: on-site physical face to visit visit spent in total care time and counseling or/and coordination of care by this undersigned MD - Dr Dorethia Geronimo. This includes one or more of the following on this same day 07/22/2023: pre-charting, chart review, note writing, documentation discussion of test results, diagnostic or treatment recommendations, prognosis, risks and benefits of management options, instructions, education, compliance or risk-factor reduction. It excludes time spent by the CMA or office staff in the care of the patient . Actual time is 35 min)   SIGNATURE    Dr. Dorethia Geronimo, M.D., F.C.C.P,  Pulmonary and Critical Care Medicine Staff Physician, [facility] Center Director - Interstitial Lung Disease  Program  Pulmonary Fibrosis  [facility] at [facility] [place], [place], [address]  Pager: [phone], If no answer or between  15:00h - 7:00h: call [phone] Telephone: [phone]  3:56 PM 07/22/2023

## 2023-07-21 NOTE — Patient Instructions (Addendum)
 ICD-10-CM   1. ILD (interstitial lung disease) (HCC)  J84.9     2. 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 Elevated CK  R74.8        Scleroderma interstitial lung disease  -Clinically stable since restart of CellCept - PFT improved at [facility] - feeling stable  Plan  - CellCept directivee by [facility] and [facility] - keep appt with them March 2025 - do spiro and dlco here in 6 months  - can decide on CT based on these results (last feb 2024 at [facility])  - if there is progression there is oral ofev and inhaled clinical trials available  Myopathy/elevated CK  - IVIG given locally in [place] through your hematologist as directed by [facility]  - this is helping you and CK dec 2024 is normal  - noted plan for reduced frequency of IvIG dosing  Plan  - CK and PFT monitoring through [facility] and [place] with blood work through Dr Gladis her locally  Acute scleroderma renal crisis [facility]) -January 2024 in the setting of stopping CellCept because of shingles   -Creatinine in dec 2024 is1.15mg  % and significantly better since early 2024  Plan -According to renal    Follow-up -6 months face-to-face visit with Dr. Geronimo ; 30 min

## 2023-07-22 ENCOUNTER — Encounter: Payer: Self-pay | Admitting: Internal Medicine

## 2023-07-22 ENCOUNTER — Ambulatory Visit (INDEPENDENT_AMBULATORY_CARE_PROVIDER_SITE_OTHER): Payer: Medicare Other | Admitting: Internal Medicine

## 2023-07-22 VITALS — BP 114/60 | HR 82 | Ht 64.0 in | Wt 125.6 lb

## 2023-07-22 DIAGNOSIS — M349 Systemic sclerosis, unspecified: Secondary | ICD-10-CM | POA: Diagnosis not present

## 2023-07-22 DIAGNOSIS — J849 Interstitial pulmonary disease, unspecified: Secondary | ICD-10-CM | POA: Diagnosis not present

## 2023-07-22 DIAGNOSIS — R748 Abnormal levels of other serum enzymes: Secondary | ICD-10-CM | POA: Diagnosis not present

## 2023-07-24 ENCOUNTER — Ambulatory Visit (HOSPITAL_COMMUNITY)
Admission: RE | Admit: 2023-07-24 | Discharge: 2023-07-24 | Disposition: A | Discharge status: Home / Self Care | Payer: Medicare Other | Source: Ambulatory Visit | Attending: Internal Medicine | Admitting: Internal Medicine

## 2023-07-24 ENCOUNTER — Encounter (HOSPITAL_COMMUNITY): Payer: Self-pay | Admitting: Internal Medicine

## 2023-07-24 VITALS — BP 110/70 | HR 84 | Wt 125.4 lb

## 2023-07-24 DIAGNOSIS — Z79899 Other long term (current) drug therapy: Secondary | ICD-10-CM | POA: Diagnosis not present

## 2023-07-24 DIAGNOSIS — I272 Pulmonary hypertension, unspecified: Secondary | ICD-10-CM

## 2023-07-24 DIAGNOSIS — Z853 Personal history of malignant neoplasm of breast: Secondary | ICD-10-CM | POA: Insufficient documentation

## 2023-07-24 DIAGNOSIS — Z79624 Long term (current) use of inhibitors of nucleotide synthesis: Secondary | ICD-10-CM | POA: Insufficient documentation

## 2023-07-24 DIAGNOSIS — M349 Systemic sclerosis, unspecified: Secondary | ICD-10-CM | POA: Diagnosis not present

## 2023-07-24 DIAGNOSIS — Z923 Personal history of irradiation: Secondary | ICD-10-CM | POA: Insufficient documentation

## 2023-07-24 DIAGNOSIS — J849 Interstitial pulmonary disease, unspecified: Secondary | ICD-10-CM | POA: Insufficient documentation

## 2023-07-24 DIAGNOSIS — I3139 Other pericardial effusion (noninflammatory): Secondary | ICD-10-CM | POA: Insufficient documentation

## 2023-07-24 DIAGNOSIS — R220 Localized swelling, mass and lump, head: Secondary | ICD-10-CM | POA: Diagnosis not present

## 2023-07-24 NOTE — Progress Notes (Signed)
 ADVANCED HF CLINIC NOTE  Referring Physician: Dr. Geronimo Primary Care: Copland, Harlene BROCKS, MD Primary Cardiologist: None Rheumatology: Dr. Maree at [facility] and Dr. Ronita Maree @ [facility]  HPI:  [name] is a 73 y/o woman with scleroderma with related ILD, previous breast CA treated with XRT 2013 referred for further evaluation of possible PAH  She was first diagnosed with scleroderma in 2017 based on a high titer ANA and skin thickening. She has been followed previously in the [facility] ILD center and now followed by Dr .Geronimo. She was started on CellCept in 12/18 with significant improvement in FVC and diffusion capacity. Her CellCept was stopped in January 2023 due to prolonged episode of shingles.   Admitted to [facility] for scleroderma renal crisis in late January 24. BP 225/117. By the time of discharge, she had improvement but not normalization of her creatinine (peak Scr 2.8. down to 1.35). Blood pressure was better controlled. Mycophenolate was added back due to a worsening of her skin disease (now on 1000mg  BID). Since she was discharged from the hospital, she feels like she is slowly improving but continues to have increased edema.  She was seen in Pulmonary at [facility] (2/24) who reviewed data and agreed with continuing Cellcept in setting of worsening lung function   PFT 05/14/2022 [facility]  FEV1/FVC 77  FVC 2.05-73%  FEV1 1.57-73%  TLC 3.09-62%  RV 1.04-49%  RV/TLC normal DLCO 10.20-52%  DLCO/Va   Consistent with mild restriction and moderate reduction in diffusion capacity. These are significantly worse than her PFTs from 2022, when she had an FVC of 2.75 and a DLCO of 12.62   Echo: 1.27.2024 during admit for renal crisis LVEF >60-65% G1 DD RV normal. IVC normal: Moderate pericardial effusion without signs of tamponade  I saw her for first time in 3/24. Here for f/u with her husband. RHC recommended which showed mild PAH. Reports some tongue swelling on lisinopril  - which has  improved with B12 supplementation  RA = 6 RV = 39/12 PA = 39/10 (24) PCW = 6 Fick cardiac output/index = 5.7/3.4 PVR = 3.2 WU Ao sat = 99% PA sat = 74%, 77% SVC sat 75%  Echo 11/18/22: EF 65% RV normal. Moderate pericardial effusion. No tamponade.   cMRI: 02/14/23: EF 53% RV 51% no myocarditis   Echo 03/24/23: EF 60-65% No effusion Personally reviewed  At last visit inflammatory markers still elevated despite CellCept. Started on IVIG 12/23/22. Had 7 months of monthly infusions now spacing to q6 weeks x 3 then qweeks x3. Feels better. CK now normal. Joint pain and activity level much improved. No SOB, edema or presyncope.     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 layman at [facility]   systemic sclerosis , diffuse/   rheumotologist:  dr maree at [facility]---  lov 07-22-2017 (as of 07-25-2017 note not available to read)---  per pt has appt. w/ specialist at [facility]   Skin  thickening    hands, arms, legs  caused by scleroderma   Systemic sclerosis (HCC)    UTI (urinary tract infection)     Current Outpatient Medications  Medication Sig Dispense Refill   acetaminophen  (TYLENOL ) 650 MG CR tablet Take 650 mg by mouth daily as needed for pain.     diphenhydrAMINE  (BENADRYL ) 25 mg capsule Take 25 mg by mouth every 6 (six) hours as needed for allergies. Allergies and before IVIG treatment.     ELDERBERRY PO Take 1 oz by mouth 4 (four) times daily as needed. Liquid     furosemide  (LASIX ) 20 MG tablet Take 1 tablet (20 mg total) by mouth daily. 90 tablet 1   levothyroxine  (SYNTHROID ) 100 MCG tablet Take 1 tablet (100 mcg total) by mouth daily before breakfast. 30 tablet 2   losartan  (COZAAR ) 25 MG tablet Take 25 mg by mouth in the morning and at bedtime.     Multiple Vitamin (MULTIVITAMIN) capsule Take 1 capsule by mouth daily.     mycophenolate (CELLCEPT) 500 MG tablet Take 1,500 mg by mouth 2 (two) times daily.     pantoprazole  (PROTONIX ) 40 MG tablet Take 40 mg by mouth as needed.     sodium chloride  (OCEAN) 0.65 % SOLN nasal spray Place 1 spray into both nostrils as needed for congestion.     No current facility-administered medications for this encounter.    Allergies  Allergen Reactions   Betadine [Povidone Iodine] Rash   Epinephrine Other (See Comments)    Severe headaches    Iodinated Contrast Media Other (See Comments) and Rash    per pt: recently had CT 08/ 2018 even through given pre-medication, IVP still caused rash  per pt recently had CT 08/ 2018 even through given pre-medication, IVP still caused rash   Iodine Rash and Other (See Comments)   Oxycodone  Anxiety   Prednisone  Anaphylaxis    Per Nephrology pt should avoid this medication because she has Scleroderma and it will interfere with her Kidneys,    Gabapentin Other (See Comments)    Dizziness   Lidocaine  Rash   Nickel Rash and Other (See Comments)    Rash and blisters   Other  Other (See Comments) and Rash    Hospital gown   Penicillins Rash and Other (See Comments)    Rash and blisters Has patient had a PCN reaction causing immediate rash, facial/tongue/throat swelling, SOB or lightheadedness with hypotension: Yes Has patient had a PCN reaction causing severe rash involving mucus membranes or skin necrosis: No Has patient had a PCN reaction that required hospitalization: No Has patient had a PCN reaction occurring within the last 10 years: No If all of the above answers are NO, then may proceed with Cephalosporin use.    Sulfa Antibiotics Rash      Social History   Socioeconomic History   Marital status:  Married    Spouse name: Not on file   Number of children: 1   Years of education: 56   Highest education level: Not on file  Occupational History   Occupation: self employed  Tobacco Use   Smoking status: Never   Smokeless tobacco: Never  Vaping Use   Vaping status: Never Used  Substance and Sexual Activity   Alcohol  use: Yes    Comment: socially   Drug use: No   Sexual activity: Yes    Birth control/protection: Post-menopausal  Other Topics Concern   Not on file  Social History Narrative   Lives with husband in a 2 story home.  Has 1 daughter.     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     Education: college.    Social Drivers of Corporate Investment Banker Strain: Low Risk  (11/14/2022)   Overall Financial Resource Strain (CARDIA)    Difficulty of Paying Living Expenses: Not hard at all  Food Insecurity: No Food Insecurity (08/09/2022)   Hunger Vital Sign    Worried About Running Out of Food in the Last Year: Never true    Ran Out of Food in the Last Year: Never true  Transportation Needs: No Transportation Needs (08/13/2022)   Received from [facility], [facility]   [place] - Transportation    In the past 12 months, has lack of transportation kept you from medical appointments or from getting  medications?: No    Lack of Transportation (Non-Medical): No  Physical Activity: Inactive (11/14/2022)   Exercise Vital Sign    Days of Exercise per Week: 0 days    Minutes of Exercise per Session: 0 min  Stress: No Stress Concern Present (11/14/2022)   [employer] of Occupational Health - Occupational Stress Questionnaire    Feeling of Stress : Not at all  Social Connections: Socially Integrated (11/14/2022)   Social Connection and Isolation Panel [NHANES]    Frequency of Communication with Friends and Family: More than three times a week    Frequency of Social Gatherings with Friends and Family: More than three times a week    Attends Religious Services: 1 to 4 times per year    Active Member of [employer] or Organizations: Yes    Attends Engineer, Structural: More than 4 times per year    Marital Status: Married  Catering Manager Violence: Not At Risk (08/09/2022)   Humiliation, Afraid, Rape, and Kick questionnaire    Fear of Current or Ex-Partner: No    Emotionally Abused: No    Physically Abused: No    Sexually Abused: No      Family History  Problem Relation Age of Onset   Heart disease Mother    Heart failure Mother    Heart disease Father    Ovarian cancer Maternal Aunt    Heart disease Paternal Uncle        multiple   Autoimmune disease Sister    Arrhythmia Sister    Anemia Sister    CAD Brother    Colon cancer Neg Hx    Stomach cancer Neg Hx    Esophageal cancer Neg Hx    Rectal cancer Neg Hx     Vitals:   07/24/23 1441  BP: 110/70  Pulse: 84  SpO2: 99%  Weight: 56.9 kg (125 lb 6.4 oz)    PHYSICAL EXAM: General:  Well appearing. No resp difficulty HEENT: normal +telengectasias  skin tightening Neck: supple. no JVD.  Carotids 2+ bilat; no bruits. No lymphadenopathy or thryomegaly appreciated. Cor: PMI nondisplaced. Regular rate & rhythm. No rubs, gallops or murmurs. Lungs: clear Abdomen: soft, nontender, nondistended. No hepatosplenomegaly. No  bruits or masses. Good bowel sounds. Extremities: no cyanosis, clubbing, rash, edema + arthritic changes Neuro: alert & orientedx3, cranial nerves grossly intact. moves all 4 extremities w/o difficulty. Affect pleasant  ASSESSMENT & PLAN:  1. Scleroderma c/b ILD - Per Rheum and ILD Clinic - Continue mycophenylate - Had refractory flare and has been on IVIG. Now weaning  2. Pericardial effusion - Echo 1/24: Effusion small to moderate. No tamponade - Echo 11/18/22: EF 65% RV normal. Moderate pericardial effusion (no change). No tamponade.  - Treated with IVIG for persistent flare in 6/24. 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 In setting of scleroderma would favor treatment with ERA but given pressures only mildly elevated we were waiting for scleroderma flare to resovle bfore repeating RHC. We discussed again toda and would prefer to avoid RHC. Will repeat echo. If PA pressures increasing will discuss repeat RHC  4. ILD - followed by Dr. Geronimo  5. Scleroderma renal crisis 1/24 - resolved. Remains on mycophenylate and ACE-I - following with Renal  - had some tongue swelling on lisinopril  - resolved with B12 supplementation - I switched her to losartan . BP well controlled    Toribio Fuel, MD  3:00 PM

## 2023-07-24 NOTE — Patient Instructions (Signed)
 Your physician recommends that you schedule a follow-up appointment in: 4 months with an echocardiogram (May 2025), **PLEASE CALL OUR OFFICE IN MARCH TO SCHEDULE THIS APPOINTMENT  If you have any questions or concerns before your next appointment please send us  a message through [place] or call our office at [phone].    TO LEAVE A MESSAGE FOR THE NURSE SELECT OPTION 2, PLEASE LEAVE A MESSAGE INCLUDING: YOUR NAME DATE OF BIRTH CALL BACK NUMBER REASON FOR CALL**this is important as we prioritize the call backs  YOU WILL RECEIVE A CALL BACK THE SAME DAY AS LONG AS YOU CALL BEFORE 4:00 PM  At the [facility], you and your health needs are our priority. As part of our continuing mission to provide you with exceptional heart care, we have created designated Provider Care Teams. These Care Teams include your primary Cardiologist (physician) and Advanced Practice Providers (APPs- Physician Assistants and Nurse Practitioners) who all work together to provide you with the care you need, when you need it.   You may see any of the following providers on your designated Care Team at your next follow up: Dr Toribio Fuel Dr Ezra Shuck Dr. Ria Commander Dr. Morene Brownie Amy Lenetta, NP Caffie Shed, [place] [facility], [place] Beckey Coe, NP Jordan Lee, NP Tinnie Redman, PharmD   Please be sure to bring in all your medications bottles to every appointment.    Thank you for choosing [facility]

## 2023-07-28 ENCOUNTER — Other Ambulatory Visit: Payer: Self-pay | Admitting: *Deleted

## 2023-07-28 ENCOUNTER — Encounter: Payer: Self-pay | Admitting: Hematology & Oncology

## 2023-07-28 DIAGNOSIS — M349 Systemic sclerosis, unspecified: Secondary | ICD-10-CM

## 2023-07-28 DIAGNOSIS — D509 Iron deficiency anemia, unspecified: Secondary | ICD-10-CM

## 2023-07-28 DIAGNOSIS — C50412 Malignant neoplasm of upper-outer quadrant of left female breast: Secondary | ICD-10-CM

## 2023-08-04 ENCOUNTER — Other Ambulatory Visit: Payer: Self-pay

## 2023-08-04 ENCOUNTER — Inpatient Hospital Stay: Payer: Medicare Other

## 2023-08-04 ENCOUNTER — Inpatient Hospital Stay (HOSPITAL_BASED_OUTPATIENT_CLINIC_OR_DEPARTMENT_OTHER): Payer: Medicare Other | Admitting: Hematology & Oncology

## 2023-08-04 ENCOUNTER — Encounter: Payer: Self-pay | Admitting: Hematology & Oncology

## 2023-08-04 ENCOUNTER — Inpatient Hospital Stay: Payer: Medicare Other | Attending: Hematology & Oncology

## 2023-08-04 VITALS — BP 107/82 | HR 89 | Temp 97.5°F | Resp 18 | Ht 64.0 in | Wt 131.0 lb

## 2023-08-04 DIAGNOSIS — R110 Nausea: Secondary | ICD-10-CM | POA: Diagnosis not present

## 2023-08-04 DIAGNOSIS — Z79624 Long term (current) use of inhibitors of nucleotide synthesis: Secondary | ICD-10-CM | POA: Diagnosis not present

## 2023-08-04 DIAGNOSIS — C50912 Malignant neoplasm of unspecified site of left female breast: Secondary | ICD-10-CM | POA: Diagnosis not present

## 2023-08-04 DIAGNOSIS — D509 Iron deficiency anemia, unspecified: Secondary | ICD-10-CM

## 2023-08-04 DIAGNOSIS — Z79899 Other long term (current) drug therapy: Secondary | ICD-10-CM | POA: Diagnosis not present

## 2023-08-04 DIAGNOSIS — Z170 Estrogen receptor positive status [ER+]: Secondary | ICD-10-CM | POA: Diagnosis not present

## 2023-08-04 DIAGNOSIS — D649 Anemia, unspecified: Secondary | ICD-10-CM | POA: Insufficient documentation

## 2023-08-04 DIAGNOSIS — C50412 Malignant neoplasm of upper-outer quadrant of left female breast: Secondary | ICD-10-CM | POA: Diagnosis not present

## 2023-08-04 DIAGNOSIS — Z79890 Hormone replacement therapy (postmenopausal): Secondary | ICD-10-CM | POA: Insufficient documentation

## 2023-08-04 DIAGNOSIS — N1831 Chronic kidney disease, stage 3a: Secondary | ICD-10-CM | POA: Diagnosis not present

## 2023-08-04 DIAGNOSIS — M349 Systemic sclerosis, unspecified: Secondary | ICD-10-CM

## 2023-08-04 DIAGNOSIS — D499 Neoplasm of unspecified behavior of unspecified site: Secondary | ICD-10-CM

## 2023-08-04 DIAGNOSIS — D6489 Other specified anemias: Secondary | ICD-10-CM | POA: Insufficient documentation

## 2023-08-04 DIAGNOSIS — I213 ST elevation (STEMI) myocardial infarction of unspecified site: Secondary | ICD-10-CM | POA: Insufficient documentation

## 2023-08-04 LAB — CBC WITH DIFFERENTIAL (CANCER CENTER ONLY)
Abs Immature Granulocytes: 0.02 10*3/uL (ref 0.00–0.07)
Basophils Absolute: 0.1 10*3/uL (ref 0.0–0.1)
Basophils Relative: 1 %
Eosinophils Absolute: 0.2 10*3/uL (ref 0.0–0.5)
Eosinophils Relative: 3 %
HCT: 31.2 % — ABNORMAL LOW (ref 36.0–46.0)
Hemoglobin: 10 g/dL — ABNORMAL LOW (ref 12.0–15.0)
Immature Granulocytes: 0 %
Lymphocytes Relative: 9 %
Lymphs Abs: 0.8 10*3/uL (ref 0.7–4.0)
MCH: 30 pg (ref 26.0–34.0)
MCHC: 32.1 g/dL (ref 30.0–36.0)
MCV: 93.7 fL (ref 80.0–100.0)
Monocytes Absolute: 0.6 10*3/uL (ref 0.1–1.0)
Monocytes Relative: 7 %
Neutro Abs: 6.6 10*3/uL (ref 1.7–7.7)
Neutrophils Relative %: 80 %
Platelet Count: 261 10*3/uL (ref 150–400)
RBC: 3.33 MIL/uL — ABNORMAL LOW (ref 3.87–5.11)
RDW: 13.8 % (ref 11.5–15.5)
WBC Count: 8.3 10*3/uL (ref 4.0–10.5)
nRBC: 0 % (ref 0.0–0.2)

## 2023-08-04 LAB — CMP (CANCER CENTER ONLY)
ALT: 8 U/L (ref 0–44)
AST: 17 U/L (ref 15–41)
Albumin: 3.9 g/dL (ref 3.5–5.0)
Alkaline Phosphatase: 62 U/L (ref 38–126)
Anion gap: 8 (ref 5–15)
BUN: 15 mg/dL (ref 8–23)
CO2: 25 mmol/L (ref 22–32)
Calcium: 8.9 mg/dL (ref 8.9–10.3)
Chloride: 101 mmol/L (ref 98–111)
Creatinine: 1.06 mg/dL — ABNORMAL HIGH (ref 0.44–1.00)
GFR, Estimated: 55 mL/min — ABNORMAL LOW (ref >60)
Glucose, Bld: 104 mg/dL — ABNORMAL HIGH (ref 70–99)
Potassium: 3.9 mmol/L (ref 3.5–5.1)
Sodium: 134 mmol/L — ABNORMAL LOW (ref 135–145)
Total Bilirubin: 0.3 mg/dL (ref 0.0–1.2)
Total Protein: 6.6 g/dL (ref 6.5–8.1)

## 2023-08-04 LAB — IRON AND IRON BINDING CAPACITY (CC-WL,HP ONLY)
Iron: 98 ug/dL (ref 28–170)
Saturation Ratios: 31 % (ref 10.4–31.8)
TIBC: 314 ug/dL (ref 250–450)
UIBC: 216 ug/dL (ref 148–442)

## 2023-08-04 LAB — C-REACTIVE PROTEIN: CRP: 0.7 mg/dL (ref <1.0)

## 2023-08-04 LAB — RETICULOCYTES
Immature Retic Fract: 11.5 % (ref 2.3–15.9)
RBC.: 3.35 MIL/uL — ABNORMAL LOW (ref 3.87–5.11)
Retic Count, Absolute: 57 10*3/uL (ref 19.0–186.0)
Retic Ct Pct: 1.7 % (ref 0.4–3.1)

## 2023-08-04 LAB — TSH: TSH: 0.613 u[IU]/mL (ref 0.350–4.500)

## 2023-08-04 LAB — CK: Total CK: 72 U/L (ref 38–234)

## 2023-08-04 LAB — FERRITIN: Ferritin: 84 ng/mL (ref 11–307)

## 2023-08-04 LAB — SEDIMENTATION RATE: Sed Rate: 37 mm/h — ABNORMAL HIGH (ref 0–22)

## 2023-08-04 MED ORDER — ACETAMINOPHEN 325 MG PO TABS
650.0000 mg | ORAL_TABLET | Freq: Once | ORAL | Status: DC
Start: 2023-08-04 — End: 2023-08-04

## 2023-08-04 MED ORDER — DARBEPOETIN ALFA 300 MCG/0.6ML IJ SOSY
300.0000 ug | PREFILLED_SYRINGE | Freq: Once | INTRAMUSCULAR | Status: AC
Start: 2023-08-04 — End: 2023-08-04
  Administered 2023-08-04: 300 ug via SUBCUTANEOUS
  Filled 2023-08-04: qty 0.6

## 2023-08-04 MED ORDER — IMMUNE GLOBULIN (HUMAN) 20 GM/200ML IV SOLN
25.0000 g | Freq: Once | INTRAVENOUS | Status: AC
  Administered 2023-08-04: 25 g via INTRAVENOUS
  Filled 2023-08-04: qty 200

## 2023-08-04 MED ORDER — DIPHENHYDRAMINE HCL 25 MG PO CAPS
25.0000 mg | ORAL_CAPSULE | Freq: Once | ORAL | Status: DC
Start: 2023-08-04 — End: 2023-08-04

## 2023-08-04 NOTE — Progress Notes (Signed)
Hematology and Oncology Follow Up Visit  [name] [id number] 1951/04/11 73 y.o. 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 Fortenberry is back for follow-up.  She looks quite good.  She was quite busy over the [place] season.  I saw pictures of she and her family at the [facility].  She seemed to have a good time.  She really seems to be doing well with the IVIG.  She is not having a lot of problems with the IVIG.  She does feel little bit weak afterwards.  She has little bit of nausea and cough afterwards.  She has had no obvious problems with respect to the scleroderma.  She does see Rheumatology for this.  There is been no bleeding.  She has had no change in bowel or bladder habits.  She has had no rashes.  She has had no leg swelling.  She has had no fever.  Thankfully, she has had no problems with COVID.  Of note, her last aldolase was down to 4.4.  Her C-reactive protein today is 0.7.  Her creatinine kinase is down to 72.  When we started IVIG was already up to 360.  I am very happy and very excited about this.  Overall, I would say that her performance status is probably ECOG 1.   Medications:  Current Outpatient Medications:    acetaminophen (TYLENOL) 650 MG CR tablet, Take 650 mg by mouth daily as needed for pain., Disp: , Rfl:    diphenhydrAMINE (BENADRYL) 25 mg capsule, Take 25 mg by mouth every 6 (six) hours as needed for allergies. Allergies and before IVIG treatment., Disp: , Rfl:    ELDERBERRY PO, Take 1 oz by mouth 4 (four) times daily as needed. Liquid, Disp: , Rfl:    furosemide (LASIX) 20 MG tablet, Take 1 tablet (20 mg total) by mouth daily., Disp: 90 tablet, Rfl: 1    levothyroxine (SYNTHROID) 100 MCG tablet, Take 1 tablet (100 mcg total) by mouth daily before breakfast., Disp: 30 tablet, Rfl: 2   losartan (COZAAR) 25 MG tablet, Take 25 mg by mouth in the morning and at bedtime., Disp: , Rfl:    Multiple Vitamin (MULTIVITAMIN) capsule, Take 1 capsule by mouth daily., Disp: , Rfl:    mycophenolate (CELLCEPT) 500 MG tablet, Take 1,500 mg by mouth 2 (two) times daily., Disp: , Rfl:    pantoprazole (PROTONIX) 40 MG tablet, Take 40 mg by mouth as needed., Disp: , Rfl:    sodium chloride (OCEAN) 0.65 % SOLN nasal spray, Place 1 spray into both nostrils as needed for congestion., Disp: , Rfl:   Allergies:  Allergies  Allergen Reactions   Betadine [Povidone Iodine] Rash   Epinephrine Other (See Comments)    Severe headaches    Iodinated Contrast Media Other (See Comments) and Rash    "per pt: recently had CT 08/ 2018 even through given pre-medication, IVP still caused rash"  "per pt recently had CT 08/ 2018 even through given pre-medication, IVP still caused rash"   Iodine Rash and Other (See Comments)   Oxycodone Anxiety   Prednisone Anaphylaxis    Per Nephrology pt should avoid this medication because she has Scleroderma and it will interfere with her  Kidneys,    Gabapentin Other (See Comments)    Dizziness   Lidocaine Rash   Nickel Rash and Other (See Comments)    Rash and blisters   Other Other (See Comments) and Rash    Hospital gown   Penicillins Rash and Other (See Comments)    Rash and blisters Has patient had a PCN reaction causing immediate rash, facial/tongue/throat swelling, SOB or lightheadedness with hypotension: Yes Has patient had a PCN reaction causing severe rash involving mucus membranes or skin necrosis: No Has patient had a PCN reaction that required hospitalization: No Has patient had a PCN reaction occurring within the last 10 years: No If all of the above answers are "NO", then may proceed with Cephalosporin use.    Sulfa  Antibiotics Rash    Past Medical History, Surgical history, Social history, and Family History were reviewed and updated.  Review of Systems: Review of Systems  Constitutional:  Positive for fatigue.  HENT:  Negative.    Eyes: Negative.   Respiratory: Negative.    Cardiovascular: Negative.   Gastrointestinal: Negative.   Endocrine: Negative.   Genitourinary: Negative.    Musculoskeletal:  Positive for arthralgias and myalgias.  Skin:  Positive for rash.  Neurological: Negative.   Hematological: Negative.   Psychiatric/Behavioral: Negative.      Physical Exam: Temperature is 97.5.  Pulse 89.  Blood pressure 107/82.  Weight is 131 pounds.     Wt Readings from Last 3 Encounters:  08/04/23 131 lb (59.4 kg)  07/24/23 125 lb 6.4 oz (56.9 kg)  07/22/23 125 lb 9.6 oz (57 kg)    Physical Exam Vitals reviewed.  Constitutional:      Comments: Her breast exam shows right breast with no masses, edema or erythema.  There is no right axillary adenopathy.  Left breast shows the lumpectomy scar at about the 2 o'clock position.  There is little firmness at the lumpectomy site.  No distinct masses noted.  There is no left axillary adenopathy.  There is no left nipple discharge.  HENT:     Head: Normocephalic and atraumatic.  Eyes:     Pupils: Pupils are equal, round, and reactive to light.  Cardiovascular:     Rate and Rhythm: Normal rate and regular rhythm.     Heart sounds: Normal heart sounds.  Pulmonary:     Effort: Pulmonary effort is normal.     Breath sounds: Normal breath sounds.  Abdominal:     General: Bowel sounds are normal.     Palpations: Abdomen is soft.  Musculoskeletal:        General: No tenderness or deformity. Normal range of motion.     Cervical back: Normal range of motion.  Lymphadenopathy:     Cervical: No cervical adenopathy.  Skin:    General: Skin is warm and dry.     Findings: No erythema or rash.     Comments: Skin exam shows some changes of the  scleroderma.  This is mostly on the face, around the lip area.  Her arms do show a little bit of skin thickening.  Neurological:     Mental Status: She is alert and oriented to person, place, and time.  Psychiatric:        Behavior: Behavior normal.        Thought Content: Thought content normal.        Judgment: Judgment normal.     Lab Results  Component Value Date   WBC 7.6 06/20/2023  HGB 9.7 (L) 06/20/2023   HCT 30.0 (L) 06/20/2023   MCV 94.3 06/20/2023   PLT 247 06/20/2023     Chemistry      Component Value Date/Time   NA 132 (L) 06/20/2023 0810   NA 144 01/27/2020 1148   NA 141 06/09/2017 1117   K 3.4 (L) 06/20/2023 0810   K 3.9 06/09/2017 1117   CL 102 06/20/2023 0810   CL 109 (H) 12/14/2012 1253   CO2 22 06/20/2023 0810   CO2 23 06/09/2017 1117   BUN 19 06/20/2023 0810   BUN 11 01/27/2020 1148   BUN 11.5 06/09/2017 1117   CREATININE 1.15 (H) 06/20/2023 0810   CREATININE 1.2 (H) 06/09/2017 1117      Component Value Date/Time   CALCIUM 8.7 (L) 06/20/2023 0810   CALCIUM 8.9 06/09/2017 1117   ALKPHOS 55 06/20/2023 0810   ALKPHOS 40 06/09/2017 1117   AST 25 06/20/2023 0810   AST 35 (H) 06/09/2017 1117   ALT 14 06/20/2023 0810   ALT 19 06/09/2017 1117   BILITOT 0.4 06/20/2023 0810   BILITOT 0.37 06/09/2017 1117      Impression and Plan: Ms. Heinkel is a very charming 73 year old white female.  She has had a history of early stage breast cancer.    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  She is on IVIG.  This does seem to be helping her.  Her tumor markers that we are using-CK, aldolase, and C-reactive protein  -are all better.  Again I do not see any problems with respect to the breast cancer.  I think that with the patients with autoimmune diseases, the tumor marker for STEMI on the elevated side.  Again she does well with the IVIG.  She gets is 5 days in a row.  We have moved her appointments out every 6 weeks.  We will keep her at every 6  weeks and then we can certainly see about moving her out even further.  Josph Macho, MD 1/20/202510:29 AM

## 2023-08-04 NOTE — Patient Instructions (Signed)
Immune Globulin Injection What is this medication? IMMUNE GLOBULIN (im MUNE GLOB yoo lin) treats many immune system conditions. It works by Designer, multimedia extra antibodies. Antibodies are proteins made by the immune system that help protect the body. This medicine may be used for other purposes; ask your health care provider or pharmacist if you have questions. COMMON BRAND NAME(S): ASCENIV, Baygam, BIVIGAM, Carimune, Carimune NF, cutaquig, Cuvitru, Flebogamma, Flebogamma DIF, GamaSTAN, GamaSTAN S/D, Gamimune N, Gammagard, Gammagard S/D, Gammaked, Gammaplex, Gammar-P IV, Gamunex, Gamunex-C, Hizentra, Iveegam, Iveegam EN, Octagam, Panglobulin, Panglobulin NF, panzyga, Polygam S/D, Privigen, Sandoglobulin, Venoglobulin-S, Vigam, Vivaglobulin, Xembify What should I tell my care team before I take this medication? They need to know if you have any of these conditions: Blood clotting disorder Condition where you have excess fluid in your body, such as heart failure or edema Dehydration Diabetes Have had blood clots Heart disease Immune system conditions Kidney disease Low levels of IgA Recent or upcoming vaccine An unusual or allergic reaction to immune globulin, other medications, foods, dyes, or preservatives Pregnant or trying to get pregnant Breastfeeding How should I use this medication? This medication is infused into a vein or under the skin. It is usually given by your care team in a hospital or clinic setting. It may also be given at home. If you get this medication at home, you will be taught how to prepare and give it. Use exactly as directed. Take it as directed on the prescription label at the same time every day. Keep taking it unless your care team tells you to stop. It is important that you put your used needles and syringes in a special sharps container. Do not put them in a trash can. If you do not have a sharps container, call your pharmacist or care team to get one. Talk to  your care team about the use of this medication in children. While it may be given to children for selected conditions, precautions do apply. Overdosage: If you think you have taken too much of this medicine contact a poison control center or emergency room at once. NOTE: This medicine is only for you. Do not share this medicine with others. What if I miss a dose? If you get this medication at the hospital or clinic: It is important not to miss your dose. Call your care team if you are unable to keep an appointment. If you give yourself this medication at home: If you miss a dose, take it as soon as you can. Then continue your normal schedule. If it is almost time for your next dose, take only that dose. Do not take double or extra doses. Call your care team with questions. What may interact with this medication? Live virus vaccines This list may not describe all possible interactions. Give your health care provider a list of all the medicines, herbs, non-prescription drugs, or dietary supplements you use. Also tell them if you smoke, drink alcohol, or use illegal drugs. Some items may interact with your medicine. What should I watch for while using this medication? Your condition will be monitored carefully while you are receiving this medication. Tell your care team if your symptoms do not start to get better or if they get worse. You may need blood work done while you are taking this medication. This medication increases the risk of blood clots. People with heart, blood vessel, or blood clotting conditions are more likely to develop a blood clot. Other risk factors include advanced age, estrogen  use, tobacco use, lack of movement, and being overweight. This medication can decrease the response to a vaccine. If you need to get vaccinated, tell your care team if you have received this medication within the last year. Extra booster doses may be needed. Talk to your care team to see if a different  vaccination schedule is needed. If you have diabetes, you may get a falsely elevated blood sugar reading. Talk to your care team about how to check your blood sugar while taking this medication. What side effects may I notice from receiving this medication? Side effects that you should report to your care team as soon as possible: Allergic reactions--skin rash, itching, hives, swelling of the face, lips, tongue, or throat Blood clot--pain, swelling, or warmth in the leg, shortness of breath, chest pain Fever, neck pain or stiffness, sensitivity to light, headache, nausea, vomiting, confusion, which may be signs of meningitis Hemolytic anemia--unusual weakness or fatigue, dizziness, headache, trouble breathing, dark urine, yellowing skin or eyes Kidney injury--decrease in the amount of urine, swelling of the ankles, hands, or feet Low sodium level--muscle weakness, fatigue, dizziness, headache, confusion Shortness of breath or trouble breathing, cough, unusual weakness or fatigue, blue skin or lips Side effects that usually do not require medical attention (report these to your care team if they continue or are bothersome): Chills Diarrhea Fever Headache Nausea This list may not describe all possible side effects. Call your doctor for medical advice about side effects. You may report side effects to FDA at 1-800-FDA-1088. Where should I keep my medication? Keep out of the reach of children and pets. You will be instructed on how to store this medication. Get rid of any unused medication after the expiration date. To get rid of medications that are no longer needed or have expired: Take the medication to a medication take-back program. Check with your pharmacy or law enforcement to find a location. If you cannot return the medication, ask your pharmacist or care team how to get rid of this medication safely. NOTE: This sheet is a summary. It may not cover all possible information. If you have  questions about this medicine, talk to your doctor, pharmacist, or health care provider.  2024 Elsevier/Gold Standard (2023-06-13 00:00:00)

## 2023-08-04 NOTE — Patient Instructions (Signed)

## 2023-08-05 ENCOUNTER — Encounter: Payer: Self-pay | Admitting: Family Medicine

## 2023-08-05 ENCOUNTER — Inpatient Hospital Stay: Payer: Medicare Other

## 2023-08-05 VITALS — BP 120/63 | HR 78 | Temp 97.7°F | Resp 18

## 2023-08-05 DIAGNOSIS — D509 Iron deficiency anemia, unspecified: Secondary | ICD-10-CM

## 2023-08-05 DIAGNOSIS — Z170 Estrogen receptor positive status [ER+]: Secondary | ICD-10-CM | POA: Diagnosis not present

## 2023-08-05 DIAGNOSIS — C50912 Malignant neoplasm of unspecified site of left female breast: Secondary | ICD-10-CM | POA: Diagnosis not present

## 2023-08-05 DIAGNOSIS — M349 Systemic sclerosis, unspecified: Secondary | ICD-10-CM | POA: Diagnosis not present

## 2023-08-05 DIAGNOSIS — D649 Anemia, unspecified: Secondary | ICD-10-CM | POA: Diagnosis not present

## 2023-08-05 DIAGNOSIS — R110 Nausea: Secondary | ICD-10-CM | POA: Diagnosis not present

## 2023-08-05 DIAGNOSIS — I213 ST elevation (STEMI) myocardial infarction of unspecified site: Secondary | ICD-10-CM | POA: Diagnosis not present

## 2023-08-05 LAB — KAPPA/LAMBDA LIGHT CHAINS
Kappa free light chain: 15.6 mg/L (ref 3.3–19.4)
Kappa, lambda light chain ratio: 1.34 (ref 0.26–1.65)
Lambda free light chains: 11.6 mg/L (ref 5.7–26.3)

## 2023-08-05 LAB — CANCER ANTIGEN 27.29: CA 27.29: 38.5 U/mL (ref 0.0–38.6)

## 2023-08-05 LAB — ALDOLASE: Aldolase: 3.6 U/L (ref 3.3–10.3)

## 2023-08-05 MED ORDER — ACETAMINOPHEN 325 MG PO TABS
650.0000 mg | ORAL_TABLET | Freq: Once | ORAL | Status: DC
Start: 2023-08-05 — End: 2023-08-05

## 2023-08-05 MED ORDER — IMMUNE GLOBULIN (HUMAN) 20 GM/200ML IV SOLN
25.0000 g | Freq: Once | INTRAVENOUS | Status: AC
  Administered 2023-08-05: 25 g via INTRAVENOUS
  Filled 2023-08-05: qty 50

## 2023-08-05 MED ORDER — SYNTHROID 100 MCG PO TABS: 100.0000 ug | ORAL_TABLET | Freq: Every day | ORAL | 3 refills | Status: DC

## 2023-08-05 MED ORDER — HEPARIN SOD (PORK) LOCK FLUSH 100 UNIT/ML IV SOLN
500.0000 [IU] | Freq: Once | INTRAVENOUS | Status: AC
  Administered 2023-08-05: 500 [IU] via INTRAVENOUS

## 2023-08-05 MED ORDER — SODIUM CHLORIDE 0.9% FLUSH
10.0000 mL | INTRAVENOUS | Status: DC | PRN
  Administered 2023-08-05 (×2): 10 mL via INTRAVENOUS

## 2023-08-05 MED ORDER — DIPHENHYDRAMINE HCL 25 MG PO CAPS
25.0000 mg | ORAL_CAPSULE | Freq: Once | ORAL | Status: DC
Start: 2023-08-05 — End: 2023-08-05

## 2023-08-05 MED ORDER — DEXTROSE 5 % IV SOLN: INTRAVENOUS | Status: DC

## 2023-08-05 NOTE — Patient Instructions (Signed)
Immune Globulin Injection What is this medication? IMMUNE GLOBULIN (im MUNE GLOB yoo lin) treats many immune system conditions. It works by Designer, multimedia extra antibodies. Antibodies are proteins made by the immune system that help protect the body. This medicine may be used for other purposes; ask your health care provider or pharmacist if you have questions. COMMON BRAND NAME(S): ASCENIV, Baygam, BIVIGAM, Carimune, Carimune NF, cutaquig, Cuvitru, Flebogamma, Flebogamma DIF, GamaSTAN, GamaSTAN S/D, Gamimune N, Gammagard, Gammagard S/D, Gammaked, Gammaplex, Gammar-P IV, Gamunex, Gamunex-C, Hizentra, Iveegam, Iveegam EN, Octagam, Panglobulin, Panglobulin NF, panzyga, Polygam S/D, Privigen, Sandoglobulin, Venoglobulin-S, Vigam, Vivaglobulin, Xembify What should I tell my care team before I take this medication? They need to know if you have any of these conditions: Blood clotting disorder Condition where you have excess fluid in your body, such as heart failure or edema Dehydration Diabetes Have had blood clots Heart disease Immune system conditions Kidney disease Low levels of IgA Recent or upcoming vaccine An unusual or allergic reaction to immune globulin, other medications, foods, dyes, or preservatives Pregnant or trying to get pregnant Breastfeeding How should I use this medication? This medication is infused into a vein or under the skin. It is usually given by your care team in a hospital or clinic setting. It may also be given at home. If you get this medication at home, you will be taught how to prepare and give it. Use exactly as directed. Take it as directed on the prescription label at the same time every day. Keep taking it unless your care team tells you to stop. It is important that you put your used needles and syringes in a special sharps container. Do not put them in a trash can. If you do not have a sharps container, call your pharmacist or care team to get one. Talk to  your care team about the use of this medication in children. While it may be given to children for selected conditions, precautions do apply. Overdosage: If you think you have taken too much of this medicine contact a poison control center or emergency room at once. NOTE: This medicine is only for you. Do not share this medicine with others. What if I miss a dose? If you get this medication at the hospital or clinic: It is important not to miss your dose. Call your care team if you are unable to keep an appointment. If you give yourself this medication at home: If you miss a dose, take it as soon as you can. Then continue your normal schedule. If it is almost time for your next dose, take only that dose. Do not take double or extra doses. Call your care team with questions. What may interact with this medication? Live virus vaccines This list may not describe all possible interactions. Give your health care provider a list of all the medicines, herbs, non-prescription drugs, or dietary supplements you use. Also tell them if you smoke, drink alcohol, or use illegal drugs. Some items may interact with your medicine. What should I watch for while using this medication? Your condition will be monitored carefully while you are receiving this medication. Tell your care team if your symptoms do not start to get better or if they get worse. You may need blood work done while you are taking this medication. This medication increases the risk of blood clots. People with heart, blood vessel, or blood clotting conditions are more likely to develop a blood clot. Other risk factors include advanced age, estrogen  use, tobacco use, lack of movement, and being overweight. This medication can decrease the response to a vaccine. If you need to get vaccinated, tell your care team if you have received this medication within the last year. Extra booster doses may be needed. Talk to your care team to see if a different  vaccination schedule is needed. If you have diabetes, you may get a falsely elevated blood sugar reading. Talk to your care team about how to check your blood sugar while taking this medication. What side effects may I notice from receiving this medication? Side effects that you should report to your care team as soon as possible: Allergic reactions--skin rash, itching, hives, swelling of the face, lips, tongue, or throat Blood clot--pain, swelling, or warmth in the leg, shortness of breath, chest pain Fever, neck pain or stiffness, sensitivity to light, headache, nausea, vomiting, confusion, which may be signs of meningitis Hemolytic anemia--unusual weakness or fatigue, dizziness, headache, trouble breathing, dark urine, yellowing skin or eyes Kidney injury--decrease in the amount of urine, swelling of the ankles, hands, or feet Low sodium level--muscle weakness, fatigue, dizziness, headache, confusion Shortness of breath or trouble breathing, cough, unusual weakness or fatigue, blue skin or lips Side effects that usually do not require medical attention (report these to your care team if they continue or are bothersome): Chills Diarrhea Fever Headache Nausea This list may not describe all possible side effects. Call your doctor for medical advice about side effects. You may report side effects to FDA at 1-800-FDA-1088. Where should I keep my medication? Keep out of the reach of children and pets. You will be instructed on how to store this medication. Get rid of any unused medication after the expiration date. To get rid of medications that are no longer needed or have expired: Take the medication to a medication take-back program. Check with your pharmacy or law enforcement to find a location. If you cannot return the medication, ask your pharmacist or care team how to get rid of this medication safely. NOTE: This sheet is a summary. It may not cover all possible information. If you have  questions about this medicine, talk to your doctor, pharmacist, or health care provider.  2024 Elsevier/Gold Standard (2023-06-13 00:00:00)

## 2023-08-06 ENCOUNTER — Inpatient Hospital Stay: Payer: Medicare Other

## 2023-08-06 VITALS — BP 107/54 | HR 75 | Temp 97.8°F | Resp 18

## 2023-08-06 DIAGNOSIS — M349 Systemic sclerosis, unspecified: Secondary | ICD-10-CM | POA: Diagnosis not present

## 2023-08-06 DIAGNOSIS — Z170 Estrogen receptor positive status [ER+]: Secondary | ICD-10-CM | POA: Diagnosis not present

## 2023-08-06 DIAGNOSIS — C50912 Malignant neoplasm of unspecified site of left female breast: Secondary | ICD-10-CM | POA: Diagnosis not present

## 2023-08-06 DIAGNOSIS — I213 ST elevation (STEMI) myocardial infarction of unspecified site: Secondary | ICD-10-CM | POA: Diagnosis not present

## 2023-08-06 DIAGNOSIS — D649 Anemia, unspecified: Secondary | ICD-10-CM | POA: Diagnosis not present

## 2023-08-06 DIAGNOSIS — D509 Iron deficiency anemia, unspecified: Secondary | ICD-10-CM

## 2023-08-06 DIAGNOSIS — R110 Nausea: Secondary | ICD-10-CM | POA: Diagnosis not present

## 2023-08-06 MED ORDER — ACETAMINOPHEN 325 MG PO TABS
650.0000 mg | ORAL_TABLET | Freq: Once | ORAL | Status: DC
Start: 2023-08-06 — End: 2023-08-06

## 2023-08-06 MED ORDER — DIPHENHYDRAMINE HCL 25 MG PO CAPS: 25.0000 mg | ORAL_CAPSULE | Freq: Once | ORAL | Status: DC

## 2023-08-06 MED ORDER — SODIUM CHLORIDE 0.9% FLUSH
10.0000 mL | INTRAVENOUS | Status: DC | PRN
  Administered 2023-08-06: 10 mL via INTRAVENOUS

## 2023-08-06 MED ORDER — HEPARIN SOD (PORK) LOCK FLUSH 100 UNIT/ML IV SOLN
500.0000 [IU] | Freq: Once | INTRAVENOUS | Status: AC
  Administered 2023-08-06: 500 [IU] via INTRAVENOUS

## 2023-08-06 MED ORDER — IMMUNE GLOBULIN (HUMAN) 20 GM/200ML IV SOLN
25.0000 g | Freq: Once | INTRAVENOUS | Status: AC
  Administered 2023-08-06: 25 g via INTRAVENOUS
  Filled 2023-08-06: qty 200

## 2023-08-06 MED ORDER — DEXTROSE 5 % IV SOLN: INTRAVENOUS | Status: DC

## 2023-08-07 ENCOUNTER — Inpatient Hospital Stay: Payer: Medicare Other

## 2023-08-07 VITALS — BP 116/60 | HR 72 | Temp 97.7°F | Resp 17

## 2023-08-07 DIAGNOSIS — Z170 Estrogen receptor positive status [ER+]: Secondary | ICD-10-CM | POA: Diagnosis not present

## 2023-08-07 DIAGNOSIS — D649 Anemia, unspecified: Secondary | ICD-10-CM | POA: Diagnosis not present

## 2023-08-07 DIAGNOSIS — C50912 Malignant neoplasm of unspecified site of left female breast: Secondary | ICD-10-CM | POA: Diagnosis not present

## 2023-08-07 DIAGNOSIS — R110 Nausea: Secondary | ICD-10-CM | POA: Diagnosis not present

## 2023-08-07 DIAGNOSIS — M349 Systemic sclerosis, unspecified: Secondary | ICD-10-CM | POA: Diagnosis not present

## 2023-08-07 DIAGNOSIS — D509 Iron deficiency anemia, unspecified: Secondary | ICD-10-CM

## 2023-08-07 DIAGNOSIS — I213 ST elevation (STEMI) myocardial infarction of unspecified site: Secondary | ICD-10-CM | POA: Diagnosis not present

## 2023-08-07 MED ORDER — HEPARIN SOD (PORK) LOCK FLUSH 100 UNIT/ML IV SOLN
500.0000 [IU] | Freq: Once | INTRAVENOUS | Status: AC
  Administered 2023-08-07: 500 [IU] via INTRAVENOUS

## 2023-08-07 MED ORDER — IMMUNE GLOBULIN (HUMAN) 20 GM/200ML IV SOLN
25.0000 g | Freq: Once | INTRAVENOUS | Status: AC
  Administered 2023-08-07: 25 g via INTRAVENOUS
  Filled 2023-08-07: qty 200

## 2023-08-07 MED ORDER — DIPHENHYDRAMINE HCL 25 MG PO CAPS
25.0000 mg | ORAL_CAPSULE | Freq: Once | ORAL | Status: DC
Start: 2023-08-07 — End: 2023-08-07

## 2023-08-07 MED ORDER — ACETAMINOPHEN 325 MG PO TABS: 650.0000 mg | ORAL_TABLET | Freq: Once | ORAL | Status: DC

## 2023-08-07 MED ORDER — SODIUM CHLORIDE 0.9% FLUSH
10.0000 mL | INTRAVENOUS | Status: DC | PRN
  Administered 2023-08-07: 10 mL via INTRAVENOUS

## 2023-08-07 MED ORDER — DEXTROSE 5 % IV SOLN: INTRAVENOUS | Status: DC

## 2023-08-07 NOTE — Addendum Note (Signed)
Addended by: Reesa Chew on: 08/07/2023 02:15 PM   Modules accepted: Orders

## 2023-08-08 ENCOUNTER — Inpatient Hospital Stay: Payer: Medicare Other

## 2023-08-08 VITALS — BP 128/62 | HR 83 | Temp 98.0°F | Resp 16

## 2023-08-08 DIAGNOSIS — I213 ST elevation (STEMI) myocardial infarction of unspecified site: Secondary | ICD-10-CM | POA: Diagnosis not present

## 2023-08-08 DIAGNOSIS — C50912 Malignant neoplasm of unspecified site of left female breast: Secondary | ICD-10-CM | POA: Diagnosis not present

## 2023-08-08 DIAGNOSIS — D509 Iron deficiency anemia, unspecified: Secondary | ICD-10-CM

## 2023-08-08 DIAGNOSIS — D649 Anemia, unspecified: Secondary | ICD-10-CM | POA: Diagnosis not present

## 2023-08-08 DIAGNOSIS — Z170 Estrogen receptor positive status [ER+]: Secondary | ICD-10-CM | POA: Diagnosis not present

## 2023-08-08 DIAGNOSIS — M349 Systemic sclerosis, unspecified: Secondary | ICD-10-CM | POA: Diagnosis not present

## 2023-08-08 DIAGNOSIS — R110 Nausea: Secondary | ICD-10-CM | POA: Diagnosis not present

## 2023-08-08 MED ORDER — ACETAMINOPHEN 325 MG PO TABS
650.0000 mg | ORAL_TABLET | Freq: Once | ORAL | Status: DC
Start: 2023-08-08 — End: 2023-08-08

## 2023-08-08 MED ORDER — DIPHENHYDRAMINE HCL 25 MG PO CAPS
25.0000 mg | ORAL_CAPSULE | Freq: Once | ORAL | Status: DC
Start: 2023-08-08 — End: 2023-08-08

## 2023-08-08 MED ORDER — IMMUNE GLOBULIN (HUMAN) 20 GM/200ML IV SOLN
25.0000 g | Freq: Once | INTRAVENOUS | Status: AC
  Administered 2023-08-08: 25 g via INTRAVENOUS
  Filled 2023-08-08: qty 250

## 2023-08-08 MED ORDER — DEXTROSE 5 % IV SOLN: INTRAVENOUS | Status: DC

## 2023-08-08 NOTE — Patient Instructions (Signed)
Immune Globulin Injection What is this medication? IMMUNE GLOBULIN (im MUNE GLOB yoo lin) treats many immune system conditions. It works by Designer, multimedia extra antibodies. Antibodies are proteins made by the immune system that help protect the body. This medicine may be used for other purposes; ask your health care provider or pharmacist if you have questions. COMMON BRAND NAME(S): ASCENIV, Baygam, BIVIGAM, Carimune, Carimune NF, cutaquig, Cuvitru, Flebogamma, Flebogamma DIF, GamaSTAN, GamaSTAN S/D, Gamimune N, Gammagard, Gammagard S/D, Gammaked, Gammaplex, Gammar-P IV, Gamunex, Gamunex-C, Hizentra, Iveegam, Iveegam EN, Octagam, Panglobulin, Panglobulin NF, panzyga, Polygam S/D, Privigen, Sandoglobulin, Venoglobulin-S, Vigam, Vivaglobulin, Xembify What should I tell my care team before I take this medication? They need to know if you have any of these conditions: Blood clotting disorder Condition where you have excess fluid in your body, such as heart failure or edema Dehydration Diabetes Have had blood clots Heart disease Immune system conditions Kidney disease Low levels of IgA Recent or upcoming vaccine An unusual or allergic reaction to immune globulin, other medications, foods, dyes, or preservatives Pregnant or trying to get pregnant Breastfeeding How should I use this medication? This medication is infused into a vein or under the skin. It is usually given by your care team in a hospital or clinic setting. It may also be given at home. If you get this medication at home, you will be taught how to prepare and give it. Use exactly as directed. Take it as directed on the prescription label at the same time every day. Keep taking it unless your care team tells you to stop. It is important that you put your used needles and syringes in a special sharps container. Do not put them in a trash can. If you do not have a sharps container, call your pharmacist or care team to get one. Talk to  your care team about the use of this medication in children. While it may be given to children for selected conditions, precautions do apply. Overdosage: If you think you have taken too much of this medicine contact a poison control center or emergency room at once. NOTE: This medicine is only for you. Do not share this medicine with others. What if I miss a dose? If you get this medication at the hospital or clinic: It is important not to miss your dose. Call your care team if you are unable to keep an appointment. If you give yourself this medication at home: If you miss a dose, take it as soon as you can. Then continue your normal schedule. If it is almost time for your next dose, take only that dose. Do not take double or extra doses. Call your care team with questions. What may interact with this medication? Live virus vaccines This list may not describe all possible interactions. Give your health care provider a list of all the medicines, herbs, non-prescription drugs, or dietary supplements you use. Also tell them if you smoke, drink alcohol, or use illegal drugs. Some items may interact with your medicine. What should I watch for while using this medication? Your condition will be monitored carefully while you are receiving this medication. Tell your care team if your symptoms do not start to get better or if they get worse. You may need blood work done while you are taking this medication. This medication increases the risk of blood clots. People with heart, blood vessel, or blood clotting conditions are more likely to develop a blood clot. Other risk factors include advanced age, estrogen  use, tobacco use, lack of movement, and being overweight. This medication can decrease the response to a vaccine. If you need to get vaccinated, tell your care team if you have received this medication within the last year. Extra booster doses may be needed. Talk to your care team to see if a different  vaccination schedule is needed. If you have diabetes, you may get a falsely elevated blood sugar reading. Talk to your care team about how to check your blood sugar while taking this medication. What side effects may I notice from receiving this medication? Side effects that you should report to your care team as soon as possible: Allergic reactions--skin rash, itching, hives, swelling of the face, lips, tongue, or throat Blood clot--pain, swelling, or warmth in the leg, shortness of breath, chest pain Fever, neck pain or stiffness, sensitivity to light, headache, nausea, vomiting, confusion, which may be signs of meningitis Hemolytic anemia--unusual weakness or fatigue, dizziness, headache, trouble breathing, dark urine, yellowing skin or eyes Kidney injury--decrease in the amount of urine, swelling of the ankles, hands, or feet Low sodium level--muscle weakness, fatigue, dizziness, headache, confusion Shortness of breath or trouble breathing, cough, unusual weakness or fatigue, blue skin or lips Side effects that usually do not require medical attention (report these to your care team if they continue or are bothersome): Chills Diarrhea Fever Headache Nausea This list may not describe all possible side effects. Call your doctor for medical advice about side effects. You may report side effects to FDA at 1-800-FDA-1088. Where should I keep my medication? Keep out of the reach of children and pets. You will be instructed on how to store this medication. Get rid of any unused medication after the expiration date. To get rid of medications that are no longer needed or have expired: Take the medication to a medication take-back program. Check with your pharmacy or law enforcement to find a location. If you cannot return the medication, ask your pharmacist or care team how to get rid of this medication safely. NOTE: This sheet is a summary. It may not cover all possible information. If you have  questions about this medicine, talk to your doctor, pharmacist, or health care provider.  2024 Elsevier/Gold Standard (2023-06-13 00:00:00)

## 2023-08-18 ENCOUNTER — Ambulatory Visit (AMBULATORY_SURGERY_CENTER): Payer: Medicare Other | Admitting: Internal Medicine

## 2023-08-18 ENCOUNTER — Encounter: Payer: Self-pay | Admitting: Internal Medicine

## 2023-08-18 ENCOUNTER — Other Ambulatory Visit: Payer: Self-pay

## 2023-08-18 ENCOUNTER — Telehealth: Payer: Self-pay | Admitting: Pharmacy Technician

## 2023-08-18 ENCOUNTER — Encounter: Payer: Self-pay | Admitting: Hematology & Oncology

## 2023-08-18 ENCOUNTER — Other Ambulatory Visit (HOSPITAL_COMMUNITY): Payer: Self-pay

## 2023-08-18 VITALS — BP 167/68 | HR 77 | Temp 97.5°F | Resp 15 | Ht 64.0 in | Wt 132.0 lb

## 2023-08-18 DIAGNOSIS — K209 Esophagitis, unspecified without bleeding: Secondary | ICD-10-CM

## 2023-08-18 DIAGNOSIS — M349 Systemic sclerosis, unspecified: Secondary | ICD-10-CM

## 2023-08-18 DIAGNOSIS — R1319 Other dysphagia: Secondary | ICD-10-CM

## 2023-08-18 DIAGNOSIS — K2100 Gastro-esophageal reflux disease with esophagitis, without bleeding: Secondary | ICD-10-CM

## 2023-08-18 DIAGNOSIS — R1310 Dysphagia, unspecified: Secondary | ICD-10-CM

## 2023-08-18 DIAGNOSIS — K449 Diaphragmatic hernia without obstruction or gangrene: Secondary | ICD-10-CM

## 2023-08-18 DIAGNOSIS — E039 Hypothyroidism, unspecified: Secondary | ICD-10-CM | POA: Diagnosis not present

## 2023-08-18 DIAGNOSIS — K222 Esophageal obstruction: Secondary | ICD-10-CM | POA: Diagnosis not present

## 2023-08-18 DIAGNOSIS — F32A Depression, unspecified: Secondary | ICD-10-CM | POA: Diagnosis not present

## 2023-08-18 DIAGNOSIS — I10 Essential (primary) hypertension: Secondary | ICD-10-CM | POA: Diagnosis not present

## 2023-08-18 MED ORDER — SODIUM CHLORIDE 0.9 % IV SOLN: 500.0000 mL | Freq: Once | INTRAVENOUS | Status: DC

## 2023-08-18 MED ORDER — VONOPRAZAN FUMARATE 20 MG PO TABS: 20.0000 mg | ORAL_TABLET | Freq: Every day | ORAL | 0 refills | Status: DC

## 2023-08-18 NOTE — Progress Notes (Signed)
Unable to make appointment in 3 months for Dr. Rhea Belton due to schedule not being out.  Recall put in.

## 2023-08-18 NOTE — Telephone Encounter (Signed)
Pharmacy Patient Advocate Encounter   Received notification from CoverMyMeds that prior authorization for VOQUEZNA 20MG  is required/requested.   Insurance verification completed.   The patient is insured through [facility] .   Per test claim: PA required; PA submitted to above mentioned insurance via CoverMyMeds Key/confirmation #/EOC [id number] Status is pending

## 2023-08-18 NOTE — Patient Instructions (Signed)
Educational handout provided to patient related to STRUCTURES AND POST DILATION DIET  POST DILATION DIET  Continue present medications   YOU WILL BE CONTACTED FOR APPOINTMENT IN 3 MONTHS WITH DR. PYRTLE.   YOU HAD AN ENDOSCOPIC PROCEDURE TODAY AT THE [facility]:   Refer to the procedure report that was given to you for any specific questions about what was found during the examination.  If the procedure report does not answer your questions, please call your gastroenterologist to clarify.  If you requested that your care partner not be given the details of your procedure findings, then the procedure report has been included in a sealed envelope for you to review at your convenience later.  YOU SHOULD EXPECT: Some feelings of bloating in the abdomen. Passage of more gas than usual.  Walking can help get rid of the air that was put into your GI tract during the procedure and reduce the bloating. If you had a lower endoscopy (such as a colonoscopy or flexible sigmoidoscopy) you may notice spotting of blood in your stool or on the toilet paper. If you underwent a bowel prep for your procedure, you may not have a normal bowel movement for a few days.  Please Note:  You might notice some irritation and congestion in your nose or some drainage.  This is from the oxygen used during your procedure.  There is no need for concern and it should clear up in a day or so.  SYMPTOMS TO REPORT IMMEDIATELY:  Following upper endoscopy (EGD)  Vomiting of blood or coffee ground material  New chest pain or pain under the shoulder blades  Painful or persistently difficult swallowing  New shortness of breath  Fever of 100F or higher  Black, tarry-looking stools  For urgent or emergent issues, a gastroenterologist can be reached at any hour by calling [phone]. Do not use MyChart messaging for urgent concerns.    DIET:  We do recommend a small meal at first, but then you may proceed to your  regular diet.  Drink plenty of fluids but you should avoid alcoholic beverages for 24 hours.  ACTIVITY:  You should plan to take it easy for the rest of today and you should NOT DRIVE or use heavy machinery until tomorrow (because of the sedation medicines used during the test).    FOLLOW UP: Our staff will call the number listed on your records the next business day following your procedure.  We will call around 7:15- 8:00 am to check on you and address any questions or concerns that you may have regarding the information given to you following your procedure. If we do not reach you, we will leave a message.     If any biopsies were taken you will be contacted by phone or by letter within the next 1-3 weeks.  Please call us at [phone] if you have not heard about the biopsies in 3 weeks.    SIGNATURES/CONFIDENTIALITY: You and/or your care partner have signed paperwork which will be entered into your electronic medical record.  These signatures attest to the fact that that the information above on your After Visit Summary has been reviewed and is understood.  Full responsibility of the confidentiality of this discharge information lies with you and/or your care-partner.

## 2023-08-18 NOTE — Progress Notes (Signed)
 To pacu, VSS. Report to [email or web address]

## 2023-08-18 NOTE — Op Note (Signed)
[facility] Patient Name: [name] Procedure Date: 08/18/2023 10:08 AM MRN: [id number] Endoscopist: Beverley Fiedler , MD, [id number] Age: 73 Referring MD:  Date of Birth: [birth year] Gender: Female Account #: [id number] Procedure:                Upper GI endoscopy Indications:              Dysphagia, Gastro-esophageal reflux disease;                            scleroderma with systemic sclerosis, dysmotility;                            currently pantoprazole 40 mg BID Medicines:                Monitored Anesthesia Care Procedure:                Pre-Anesthesia Assessment:                           - Prior to the procedure, a History and Physical                            was performed, and patient medications and                            allergies were reviewed. The patient's tolerance of                            previous anesthesia was also reviewed. The risks                            and benefits of the procedure and the sedation                            options and risks were discussed with the patient.                            All questions were answered, and informed consent                            was obtained. Prior Anticoagulants: The patient has                            taken no anticoagulant or antiplatelet agents. ASA                            Grade Assessment: III - A patient with severe                            systemic disease. After reviewing the risks and                            benefits, the patient was deemed in satisfactory  condition to undergo the procedure.                           After obtaining informed consent, the endoscope was                            passed under direct vision. Throughout the                            procedure, the patient's blood pressure, pulse, and                            oxygen saturations were monitored continuously. The                            Olympus scope  [id number] was introduced through the                            mouth, and advanced to the second part of duodenum.                            The upper GI endoscopy was accomplished without                            difficulty. The patient tolerated the procedure                            well. Scope In: Scope Out: Findings:                 LA Grade C (one or more mucosal breaks continuous                            between tops of 2 or more mucosal folds, less than                            75% circumference) esophagitis with no bleeding was                            found in the lower third of the esophagus.                           One benign-appearing, intrinsic moderate                            (circumferential scarring or stenosis; an endoscope                            may pass) stenosis was found 37 cm from the                            incisors. This stenosis measured 1.4 cm (inner  diameter) x less than one cm (in length). The                            stenosis was traversed. A TTS dilator was passed                            through the scope. Dilation with a 15-16.5-18 mm                            balloon dilator was performed to 18 mm. The                            dilation site was examined and showed moderate                            mucosal disruption.                           A 2 cm hiatal hernia was present.                           The gastroesophageal flap valve was visualized                            endoscopically and classified as Hill Grade IV (no                            fold, wide open lumen, hiatal hernia present).                           The entire examined stomach was normal.                           The examined duodenum was normal. Complications:            No immediate complications. Estimated Blood Loss:     Estimated blood loss was minimal. Impression:               - LA Grade C reflux and acute  esophagitis with no                            bleeding.                           - Benign-appearing esophageal stenosis. Dilated to                            18 mm with balloon.                           - 2 cm hiatal hernia.                           - Normal stomach.                           -  Normal examined duodenum.                           - No specimens collected. Recommendation:           - Patient has a contact number available for                            emergencies. The signs and symptoms of potential                            delayed complications were discussed with the                            patient. Return to normal activities tomorrow.                            Written discharge instructions were provided to the                            patient.                           - Resume previous diet.                           - Continue present medications. If on pantoprazole                            BID, then would consider change to vonoprazan 20 mg                            daily for reflux esophagitis.                           - Office follow-up with me in 3 months. Beverley Fiedler, MD 08/18/2023 10:34:18 AM This report has been signed electronically.

## 2023-08-18 NOTE — Telephone Encounter (Signed)
Pharmacy Patient Advocate Encounter  Received notification from [facility] that Prior Authorization for VOQUEZNA 20MG  has been APPROVED from 2.3.25 to 2.3.26   PA #/Case ID/Reference #:  [id number]

## 2023-08-18 NOTE — Progress Notes (Signed)
GASTROENTEROLOGY PROCEDURE H&P NOTE   Primary Care Physician: Pearline Cables, MD    Reason for Procedure:  Dysphagia, scleroderma with systemic sclerosis, GERD with hiatal hernia  Plan:    EGD and possible dilation  Patient is appropriate for endoscopic procedure(s) in the ambulatory ([facility]) setting.  The nature of the procedure, as well as the risks, benefits, and alternatives were carefully and thoroughly reviewed with the patient. Ample time for discussion and questions allowed. The patient understood, was satisfied, and agreed to proceed.     HPI: [name] is a 73 y.o. female who presents for EGD with dilation.  Medical history as below.   No recent chest pain or shortness of breath.  No abdominal pain today.  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 Gaynell Face at [facility]   systemic sclerosis , diffuse/   rheumotologist:  dr Sherryll Burger at [facility]---  lov 07-22-2017  (as of 07-25-2017 note not available to read)---  per pt has appt. w/ specialist at [facility]   Skin thickening    hands, arms, legs  caused by scleroderma   Systemic sclerosis (HCC)    UTI (urinary tract infection)     Past Surgical History:  Procedure Laterality Date   BIOPSY  01/29/2018   Procedure: BIOPSY;  Surgeon: Beverley Fiedler, MD;  Location: [facility];  Service: Gastroenterology;;   Arbutus Leas  2000   Left   COLONOSCOPY WITH PROPOFOL N/A 01/29/2018   Procedure: COLONOSCOPY WITH PROPOFOL;  Surgeon: Beverley Fiedler, MD;  Location: [facility];  Service: Gastroenterology;  Laterality: N/A;   DILATATION & CURETTAGE/HYSTEROSCOPY WITH MYOSURE N/A 08/04/2017   Procedure: DILATATION & CURETTAGE/HYSTEROSCOPY WITH MYOSURE;  Surgeon: Jerene Bears, MD;  Location[facility];  Service: Gynecology;  Laterality: N/A;   ECTOPIC PREGNANCY SURGERY  1986-88   s/p unilateral salpingectomy   ESOPHAGOGASTRODUODENOSCOPY (EGD) WITH PROPOFOL N/A 01/29/2018   Procedure: ESOPHAGOGASTRODUODENOSCOPY (EGD) WITH PROPOFOL;  Surgeon: Beverley Fiedler, MD;  Location: [facility];  Service: Gastroenterology;  Laterality: N/A;   IR IMAGING GUIDED PORT INSERTION  12/02/2022   PARTIAL MASTECTOMY WITH AXILLARY SENTINEL LYMPH NODE BIOPSY Left 11-04-2011   dr Jamey Ripa  [facility]   RIGHT HEART CATH N/A 10/01/2022   Procedure: RIGHT HEART CATH;  Surgeon: Dolores Patty, MD;  Location: [facility] INVASIVE  CV LAB;  Service: Cardiovascular;  Laterality: N/A;   TRANSTHORACIC ECHOCARDIOGRAM  06-04-2017    [facility]   moderate LVH, ef>55%/  trivial AR and TR/ mild MR and PR/  trivial pericardial effusion   WISDOM TOOTH EXTRACTION      Prior to Admission medications   Medication Sig Start Date End Date Taking? Authorizing Provider  acetaminophen (TYLENOL) 650 MG CR tablet Take 650 mg by mouth daily as needed for pain.   Yes [provider]  diphenhydrAMINE (BENADRYL) 25 mg capsule Take 25 mg by mouth every 6 (six)  hours as needed for allergies. Allergies and before IVIG treatment.   Yes [provider]  ELDERBERRY PO Take 1 oz by mouth 4 (four) times daily as needed. Liquid   Yes [provider]  furosemide (LASIX) 20 MG tablet Take 1 tablet (20 mg total) by mouth daily. 04/24/23  Yes Copland, Gwenlyn Found, MD  losartan (COZAAR) 25 MG tablet Take 25 mg by mouth in the morning and at bedtime.   Yes [provider]  Multiple Vitamin (MULTIVITAMIN) capsule Take 1 capsule by mouth daily. 09/03/15  Yes [provider]  mycophenolate (CELLCEPT) 500 MG tablet Take 1,500 mg by mouth 2 (two) times daily. 08/15/22  Yes [provider]  pantoprazole (PROTONIX) 40 MG tablet Take 40 mg by mouth as needed.   Yes [provider]  SYNTHROID 100 MCG tablet Take 1 tablet (100 mcg total) by mouth daily before breakfast. 08/05/23  Yes Copland, Gwenlyn Found, MD  sodium chloride (OCEAN) 0.65 % SOLN nasal spray Place 1 spray into both nostrils as needed for congestion.    [provider]    Current Outpatient Medications  Medication Sig Dispense Refill   acetaminophen (TYLENOL) 650 MG CR tablet Take 650 mg by mouth daily as needed for pain.     diphenhydrAMINE (BENADRYL) 25 mg capsule Take 25 mg by mouth every 6 (six) hours as needed for allergies. Allergies and before IVIG treatment.     ELDERBERRY PO Take 1 oz by mouth 4 (four) times daily as needed. Liquid     furosemide (LASIX) 20 MG tablet Take 1 tablet (20 mg total) by mouth daily. 90 tablet 1   losartan (COZAAR) 25 MG tablet Take 25 mg by mouth in the morning and at bedtime.     Multiple Vitamin (MULTIVITAMIN) capsule Take 1 capsule by mouth daily.     mycophenolate (CELLCEPT) 500 MG tablet Take 1,500 mg by mouth 2 (two) times daily.     pantoprazole (PROTONIX) 40 MG tablet Take 40 mg by mouth as needed.     SYNTHROID 100 MCG tablet Take 1 tablet (100 mcg total) by mouth daily before breakfast. 90 tablet 3   sodium  chloride (OCEAN) 0.65 % SOLN nasal spray Place 1 spray into both nostrils as needed for congestion.     Current Facility-Administered Medications  Medication Dose Route Frequency Provider Last Rate Last Admin   0.9 %  sodium chloride infusion  500 mL Intravenous Once Zeniyah Peaster, Carie Caddy, MD        Allergies as of 08/18/2023 - Review Complete 08/18/2023  Allergen Reaction Noted   Betadine [povidone iodine] Rash 07/25/2017   Epinephrine Other (See Comments) 09/13/2011   Iodinated contrast media Other (See Comments) and Rash 07/25/2017   Iodine Rash and Other (See Comments) 09/13/2011   Oxycodone Anxiety 11/14/2011   Prednisone Anaphylaxis 09/19/2022   Gabapentin Other (See Comments) 03/21/2022   Lidocaine Rash 02/20/2022  Nickel Rash and Other (See Comments) 09/13/2011   Other Other (See Comments) and Rash 02/06/2012   Penicillins Rash and Other (See Comments) 09/13/2011   Sulfa antibiotics Rash 07/25/2017    Family History  Problem Relation Age of Onset   Heart disease Mother    Heart failure Mother    Heart disease Father    Ovarian cancer Maternal Aunt    Heart disease Paternal Uncle        multiple   Autoimmune disease Sister    Arrhythmia Sister    Anemia Sister    CAD Brother    Colon cancer Neg Hx    Stomach cancer Neg Hx    Esophageal cancer Neg Hx    Rectal cancer Neg Hx     Social History   Socioeconomic History   Marital status: Married    Spouse name: Not on file   Number of children: 1   Years of education: 16   Highest education level: Not on file  Occupational History   Occupation: self employed  Tobacco Use   Smoking status: Never   Smokeless tobacco: Never  Vaping Use   Vaping status: Never Used  Substance and Sexual Activity   Alcohol use: Yes    Comment: socially   Drug use: No   Sexual activity: Yes    Birth control/protection: Post-menopausal  Other Topics Concern   Not on file  Social History Narrative   Lives with husband in a 2 story  home.  Has 1 daughter.     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     Education: college.    Social Drivers of Corporate investment banker Strain: Low Risk  (11/14/2022)   Overall Financial Resource Strain (CARDIA)    Difficulty of Paying Living Expenses: Not hard at all  Food Insecurity: No Food Insecurity (08/09/2022)   Hunger Vital Sign    Worried About Running Out of Food in the Last Year: Never true    Ran Out of Food in the Last Year: Never true  Transportation Needs: No Transportation Needs (08/13/2022)   Received [facility], [facility]   [facility] - Transportation    In the past 12 months, has lack of transportation kept you from medical appointments or from getting medications?: No    Lack of Transportation (Non-Medical): No  Physical Activity: Inactive (11/14/2022)   Exercise Vital Sign    Days of Exercise per Week: 0 days    Minutes of Exercise per Session: 0 min  Stress: No Stress Concern Present (11/14/2022)   [employer] of Occupational Health - Occupational Stress Questionnaire    Feeling of Stress : Not at all  Social Connections: Socially Integrated (11/14/2022)   Social Connection and Isolation Panel [NHANES]    Frequency of Communication with Friends and Family: More than three times a week    Frequency of Social Gatherings with Friends and Family: More than three times a week    Attends Religious Services: 1 to 4 times per year    Active Member of [employer] or Organizations: Yes    Attends Banker Meetings: More than 4 times per year    Marital Status: Married  Catering manager Violence: Not At Risk (08/09/2022)   Humiliation, Afraid, Rape, and Kick questionnaire    Fear of Current or Ex-Partner: No    Emotionally Abused: No    Physically Abused: No    Sexually Abused: No    Physical Exam:  Vital signs in last 24 hours: @BP  (!) 164/76   Pulse 88   Temp (!) 97.5 F (36.4 C)   Ht 5\' 4"  (1.626 m)   Wt 132 lb  (59.9 kg)   SpO2 97%   BMI 22.66 kg/m  GEN: NAD EYE: Sclerae anicteric ENT: MMM CV: Non-tachycardic Pulm: CTA b/l GI: Soft, NT/ND NEURO:  Alert & Oriented x 3   Erick Blinks, MD [facility]  08/18/2023 10:10 AM

## 2023-08-19 ENCOUNTER — Telehealth: Payer: Self-pay

## 2023-08-19 NOTE — Telephone Encounter (Signed)
Attempted to reach patient for post-procedure f/u call. No answer. Left message for her to please not hesitate to call if she has questions/concerns regarding her care.

## 2023-08-26 NOTE — Telephone Encounter (Signed)
[name], there is a Psychologist, occupational that vonoprazan often gets approved through Will you let the patient know we should send it through this pharmacy and then see if it can be covered She should not pay 570 for it

## 2023-08-27 NOTE — Telephone Encounter (Signed)
We could try pantoprazole 40 mg TID-AC, not the most typical dose, but we know she has reflux inflammation on the BID dosing currently

## 2023-08-28 ENCOUNTER — Other Ambulatory Visit: Payer: Self-pay

## 2023-08-28 MED ORDER — PANTOPRAZOLE SODIUM 40 MG PO TBEC: 40.0000 mg | DELAYED_RELEASE_TABLET | Freq: Three times a day (TID) | ORAL | 3 refills | Status: DC

## 2023-09-02 ENCOUNTER — Encounter: Payer: Self-pay | Admitting: Internal Medicine

## 2023-09-02 DIAGNOSIS — E876 Hypokalemia: Secondary | ICD-10-CM | POA: Diagnosis not present

## 2023-09-02 DIAGNOSIS — N280 Ischemia and infarction of kidney: Secondary | ICD-10-CM | POA: Diagnosis not present

## 2023-09-02 DIAGNOSIS — N189 Chronic kidney disease, unspecified: Secondary | ICD-10-CM | POA: Diagnosis not present

## 2023-09-02 DIAGNOSIS — D631 Anemia in chronic kidney disease: Secondary | ICD-10-CM | POA: Diagnosis not present

## 2023-09-02 DIAGNOSIS — N1831 Chronic kidney disease, stage 3a: Secondary | ICD-10-CM | POA: Diagnosis not present

## 2023-09-02 DIAGNOSIS — M349 Systemic sclerosis, unspecified: Secondary | ICD-10-CM | POA: Diagnosis not present

## 2023-09-02 DIAGNOSIS — I129 Hypertensive chronic kidney disease with stage 1 through stage 4 chronic kidney disease, or unspecified chronic kidney disease: Secondary | ICD-10-CM | POA: Diagnosis not present

## 2023-09-11 ENCOUNTER — Other Ambulatory Visit: Payer: Self-pay | Admitting: *Deleted

## 2023-09-11 ENCOUNTER — Encounter: Payer: Self-pay | Admitting: Hematology & Oncology

## 2023-09-11 ENCOUNTER — Other Ambulatory Visit: Payer: Self-pay

## 2023-09-11 DIAGNOSIS — C50412 Malignant neoplasm of upper-outer quadrant of left female breast: Secondary | ICD-10-CM

## 2023-09-11 DIAGNOSIS — D509 Iron deficiency anemia, unspecified: Secondary | ICD-10-CM

## 2023-09-11 DIAGNOSIS — C50612 Malignant neoplasm of axillary tail of left female breast: Secondary | ICD-10-CM

## 2023-09-11 DIAGNOSIS — E039 Hypothyroidism, unspecified: Secondary | ICD-10-CM

## 2023-09-11 NOTE — Progress Notes (Signed)
Error

## 2023-09-12 ENCOUNTER — Other Ambulatory Visit: Payer: Self-pay | Admitting: *Deleted

## 2023-09-12 DIAGNOSIS — Z79899 Other long term (current) drug therapy: Secondary | ICD-10-CM

## 2023-09-12 DIAGNOSIS — C50612 Malignant neoplasm of axillary tail of left female breast: Secondary | ICD-10-CM

## 2023-09-12 DIAGNOSIS — E038 Other specified hypothyroidism: Secondary | ICD-10-CM

## 2023-09-12 DIAGNOSIS — K700 Alcoholic fatty liver: Secondary | ICD-10-CM

## 2023-09-12 DIAGNOSIS — D499 Neoplasm of unspecified behavior of unspecified site: Secondary | ICD-10-CM

## 2023-09-12 DIAGNOSIS — Z95828 Presence of other vascular implants and grafts: Secondary | ICD-10-CM

## 2023-09-12 DIAGNOSIS — R799 Abnormal finding of blood chemistry, unspecified: Secondary | ICD-10-CM

## 2023-09-12 DIAGNOSIS — M349 Systemic sclerosis, unspecified: Secondary | ICD-10-CM

## 2023-09-12 DIAGNOSIS — C50611 Malignant neoplasm of axillary tail of right female breast: Secondary | ICD-10-CM

## 2023-09-12 DIAGNOSIS — D509 Iron deficiency anemia, unspecified: Secondary | ICD-10-CM

## 2023-09-12 DIAGNOSIS — C50412 Malignant neoplasm of upper-outer quadrant of left female breast: Secondary | ICD-10-CM

## 2023-09-12 DIAGNOSIS — D500 Iron deficiency anemia secondary to blood loss (chronic): Secondary | ICD-10-CM

## 2023-09-12 DIAGNOSIS — E039 Hypothyroidism, unspecified: Secondary | ICD-10-CM

## 2023-09-12 DIAGNOSIS — I7300 Raynaud's syndrome without gangrene: Secondary | ICD-10-CM

## 2023-09-12 DIAGNOSIS — N1831 Chronic kidney disease, stage 3a: Secondary | ICD-10-CM

## 2023-09-12 DIAGNOSIS — I3139 Other pericardial effusion (noninflammatory): Secondary | ICD-10-CM

## 2023-09-12 DIAGNOSIS — R944 Abnormal results of kidney function studies: Secondary | ICD-10-CM

## 2023-09-15 ENCOUNTER — Inpatient Hospital Stay: Payer: Medicare Other | Attending: Hematology & Oncology | Admitting: Hematology & Oncology

## 2023-09-15 ENCOUNTER — Inpatient Hospital Stay: Payer: Medicare Other

## 2023-09-15 ENCOUNTER — Encounter: Payer: Self-pay | Admitting: Hematology & Oncology

## 2023-09-15 ENCOUNTER — Other Ambulatory Visit: Payer: Self-pay

## 2023-09-15 VITALS — BP 141/59 | HR 78 | Temp 97.7°F | Resp 18 | Ht 64.0 in | Wt 133.0 lb

## 2023-09-15 VITALS — BP 104/56 | HR 85 | Resp 17

## 2023-09-15 DIAGNOSIS — I7300 Raynaud's syndrome without gangrene: Secondary | ICD-10-CM

## 2023-09-15 DIAGNOSIS — M349 Systemic sclerosis, unspecified: Secondary | ICD-10-CM | POA: Insufficient documentation

## 2023-09-15 DIAGNOSIS — K700 Alcoholic fatty liver: Secondary | ICD-10-CM

## 2023-09-15 DIAGNOSIS — R978 Other abnormal tumor markers: Secondary | ICD-10-CM | POA: Diagnosis not present

## 2023-09-15 DIAGNOSIS — Z79624 Long term (current) use of inhibitors of nucleotide synthesis: Secondary | ICD-10-CM | POA: Insufficient documentation

## 2023-09-15 DIAGNOSIS — C50612 Malignant neoplasm of axillary tail of left female breast: Secondary | ICD-10-CM

## 2023-09-15 DIAGNOSIS — D499 Neoplasm of unspecified behavior of unspecified site: Secondary | ICD-10-CM

## 2023-09-15 DIAGNOSIS — Z95828 Presence of other vascular implants and grafts: Secondary | ICD-10-CM

## 2023-09-15 DIAGNOSIS — M3481 Systemic sclerosis with lung involvement: Secondary | ICD-10-CM | POA: Diagnosis not present

## 2023-09-15 DIAGNOSIS — Z170 Estrogen receptor positive status [ER+]: Secondary | ICD-10-CM

## 2023-09-15 DIAGNOSIS — I3139 Other pericardial effusion (noninflammatory): Secondary | ICD-10-CM

## 2023-09-15 DIAGNOSIS — Z923 Personal history of irradiation: Secondary | ICD-10-CM | POA: Insufficient documentation

## 2023-09-15 DIAGNOSIS — D649 Anemia, unspecified: Secondary | ICD-10-CM | POA: Diagnosis not present

## 2023-09-15 DIAGNOSIS — E038 Other specified hypothyroidism: Secondary | ICD-10-CM

## 2023-09-15 DIAGNOSIS — C50412 Malignant neoplasm of upper-outer quadrant of left female breast: Secondary | ICD-10-CM

## 2023-09-15 DIAGNOSIS — R21 Rash and other nonspecific skin eruption: Secondary | ICD-10-CM | POA: Diagnosis not present

## 2023-09-15 DIAGNOSIS — D509 Iron deficiency anemia, unspecified: Secondary | ICD-10-CM

## 2023-09-15 DIAGNOSIS — N1831 Chronic kidney disease, stage 3a: Secondary | ICD-10-CM

## 2023-09-15 DIAGNOSIS — Z79899 Other long term (current) drug therapy: Secondary | ICD-10-CM | POA: Diagnosis not present

## 2023-09-15 DIAGNOSIS — D500 Iron deficiency anemia secondary to blood loss (chronic): Secondary | ICD-10-CM

## 2023-09-15 DIAGNOSIS — Z860100 Personal history of colon polyps, unspecified: Secondary | ICD-10-CM | POA: Diagnosis not present

## 2023-09-15 DIAGNOSIS — C50611 Malignant neoplasm of axillary tail of right female breast: Secondary | ICD-10-CM

## 2023-09-15 DIAGNOSIS — D631 Anemia in chronic kidney disease: Secondary | ICD-10-CM | POA: Insufficient documentation

## 2023-09-15 DIAGNOSIS — E039 Hypothyroidism, unspecified: Secondary | ICD-10-CM

## 2023-09-15 DIAGNOSIS — R799 Abnormal finding of blood chemistry, unspecified: Secondary | ICD-10-CM

## 2023-09-15 DIAGNOSIS — R944 Abnormal results of kidney function studies: Secondary | ICD-10-CM

## 2023-09-15 DIAGNOSIS — C50912 Malignant neoplasm of unspecified site of left female breast: Secondary | ICD-10-CM | POA: Diagnosis not present

## 2023-09-15 LAB — CBC WITH DIFFERENTIAL (CANCER CENTER ONLY)
Abs Immature Granulocytes: 0.11 10*3/uL — ABNORMAL HIGH (ref 0.00–0.07)
Basophils Absolute: 0 10*3/uL (ref 0.0–0.1)
Basophils Relative: 1 %
Eosinophils Absolute: 0.1 10*3/uL (ref 0.0–0.5)
Eosinophils Relative: 2 %
HCT: 30.7 % — ABNORMAL LOW (ref 36.0–46.0)
Hemoglobin: 9.7 g/dL — ABNORMAL LOW (ref 12.0–15.0)
Immature Granulocytes: 2 %
Lymphocytes Relative: 8 %
Lymphs Abs: 0.6 10*3/uL — ABNORMAL LOW (ref 0.7–4.0)
MCH: 29.4 pg (ref 26.0–34.0)
MCHC: 31.6 g/dL (ref 30.0–36.0)
MCV: 93 fL (ref 80.0–100.0)
Monocytes Absolute: 0.5 10*3/uL (ref 0.1–1.0)
Monocytes Relative: 6 %
Neutro Abs: 6.2 10*3/uL (ref 1.7–7.7)
Neutrophils Relative %: 81 %
Platelet Count: 218 10*3/uL (ref 150–400)
RBC: 3.3 MIL/uL — ABNORMAL LOW (ref 3.87–5.11)
RDW: 13.6 % (ref 11.5–15.5)
WBC Count: 7.6 10*3/uL (ref 4.0–10.5)
nRBC: 0 % (ref 0.0–0.2)

## 2023-09-15 LAB — CMP (CANCER CENTER ONLY)
ALT: 7 U/L (ref 0–44)
AST: 16 U/L (ref 15–41)
Albumin: 4 g/dL (ref 3.5–5.0)
Alkaline Phosphatase: 61 U/L (ref 38–126)
Anion gap: 8 (ref 5–15)
BUN: 17 mg/dL (ref 8–23)
CO2: 26 mmol/L (ref 22–32)
Calcium: 9.3 mg/dL (ref 8.9–10.3)
Chloride: 103 mmol/L (ref 98–111)
Creatinine: 1.15 mg/dL — ABNORMAL HIGH (ref 0.44–1.00)
GFR, Estimated: 50 mL/min — ABNORMAL LOW (ref >60)
Glucose, Bld: 99 mg/dL (ref 70–99)
Potassium: 3.4 mmol/L — ABNORMAL LOW (ref 3.5–5.1)
Sodium: 137 mmol/L (ref 135–145)
Total Bilirubin: 0.4 mg/dL (ref 0.0–1.2)
Total Protein: 6.8 g/dL (ref 6.5–8.1)

## 2023-09-15 LAB — FERRITIN: Ferritin: 84 ng/mL (ref 11–307)

## 2023-09-15 LAB — LACTATE DEHYDROGENASE: LDH: 173 U/L (ref 98–192)

## 2023-09-15 LAB — C-REACTIVE PROTEIN: CRP: 0.7 mg/dL (ref <1.0)

## 2023-09-15 LAB — RETICULOCYTES
Immature Retic Fract: 9.1 % (ref 2.3–15.9)
RBC.: 3.25 MIL/uL — ABNORMAL LOW (ref 3.87–5.11)
Retic Count, Absolute: 48.8 10*3/uL (ref 19.0–186.0)
Retic Ct Pct: 1.5 % (ref 0.4–3.1)

## 2023-09-15 LAB — IRON AND IRON BINDING CAPACITY (CC-WL,HP ONLY)
Iron: 103 ug/dL (ref 28–170)
Saturation Ratios: 33 % — ABNORMAL HIGH (ref 10.4–31.8)
TIBC: 309 ug/dL (ref 250–450)
UIBC: 206 ug/dL (ref 148–442)

## 2023-09-15 LAB — CK: Total CK: 72 U/L (ref 38–234)

## 2023-09-15 LAB — SEDIMENTATION RATE: Sed Rate: 50 mm/h — ABNORMAL HIGH (ref 0–22)

## 2023-09-15 LAB — TSH: TSH: 0.728 u[IU]/mL (ref 0.350–4.500)

## 2023-09-15 MED ORDER — HEPARIN SOD (PORK) LOCK FLUSH 100 UNIT/ML IV SOLN
500.0000 [IU] | Freq: Once | INTRAVENOUS | Status: AC
  Administered 2023-09-15: 500 [IU] via INTRAVENOUS

## 2023-09-15 MED ORDER — IMMUNE GLOBULIN (HUMAN) 20 GM/200ML IV SOLN
25.0000 g | Freq: Once | INTRAVENOUS | Status: AC
  Administered 2023-09-15: 25 g via INTRAVENOUS
  Filled 2023-09-15: qty 200

## 2023-09-15 MED ORDER — ACETAMINOPHEN 325 MG PO TABS: 650.0000 mg | ORAL_TABLET | Freq: Once | ORAL | Status: DC

## 2023-09-15 MED ORDER — SODIUM CHLORIDE 0.9% FLUSH
10.0000 mL | INTRAVENOUS | Status: DC | PRN
  Administered 2023-09-15: 10 mL via INTRAVENOUS

## 2023-09-15 MED ORDER — DEXTROSE 5 % IV SOLN: INTRAVENOUS | Status: DC

## 2023-09-15 MED ORDER — DIPHENHYDRAMINE HCL 25 MG PO CAPS
25.0000 mg | ORAL_CAPSULE | Freq: Once | ORAL | Status: DC
Start: 2023-09-15 — End: 2023-09-15

## 2023-09-15 NOTE — Progress Notes (Signed)
 No Aranesp today per Dr. Myna Hidalgo.  Anola Gurney [place], [place], BCPS, BCOP 09/15/2023 10:29 AM

## 2023-09-15 NOTE — Patient Instructions (Signed)

## 2023-09-15 NOTE — Patient Instructions (Signed)
 Immune Globulin Injection What is this medication? IMMUNE GLOBULIN (im MUNE GLOB yoo lin) treats many immune system conditions. It works by Designer, multimedia extra antibodies. Antibodies are proteins made by the immune system that help protect the body. This medicine may be used for other purposes; ask your health care provider or pharmacist if you have questions. COMMON BRAND NAME(S): ASCENIV, Baygam, BIVIGAM, Carimune, Carimune NF, cutaquig, Cuvitru, Flebogamma, Flebogamma DIF, GamaSTAN, GamaSTAN S/D, Gamimune N, Gammagard, Gammagard S/D, Gammaked, Gammaplex, Gammar-P IV, Gamunex, Gamunex-C, Hizentra, Iveegam, Iveegam EN, Octagam, Panglobulin, Panglobulin NF, panzyga, Polygam S/D, Privigen, Sandoglobulin, Venoglobulin-S, Vigam, Vivaglobulin, Xembify What should I tell my care team before I take this medication? They need to know if you have any of these conditions: Blood clotting disorder Condition where you have excess fluid in your body, such as heart failure or edema Dehydration Diabetes Have had blood clots Heart disease Immune system conditions Kidney disease Low levels of IgA Recent or upcoming vaccine An unusual or allergic reaction to immune globulin, other medications, foods, dyes, or preservatives Pregnant or trying to get pregnant Breastfeeding How should I use this medication? This medication is infused into a vein or under the skin. It is usually given by your care team in a hospital or clinic setting. It may also be given at home. If you get this medication at home, you will be taught how to prepare and give it. Use exactly as directed. Take it as directed on the prescription label at the same time every day. Keep taking it unless your care team tells you to stop. It is important that you put your used needles and syringes in a special sharps container. Do not put them in a trash can. If you do not have a sharps container, call your pharmacist or care team to get one. Talk to  your care team about the use of this medication in children. While it may be given to children for selected conditions, precautions do apply. Overdosage: If you think you have taken too much of this medicine contact a poison control center or emergency room at once. NOTE: This medicine is only for you. Do not share this medicine with others. What if I miss a dose? If you get this medication at the hospital or clinic: It is important not to miss your dose. Call your care team if you are unable to keep an appointment. If you give yourself this medication at home: If you miss a dose, take it as soon as you can. Then continue your normal schedule. If it is almost time for your next dose, take only that dose. Do not take double or extra doses. Call your care team with questions. What may interact with this medication? Live virus vaccines This list may not describe all possible interactions. Give your health care provider a list of all the medicines, herbs, non-prescription drugs, or dietary supplements you use. Also tell them if you smoke, drink alcohol, or use illegal drugs. Some items may interact with your medicine. What should I watch for while using this medication? Your condition will be monitored carefully while you are receiving this medication. Tell your care team if your symptoms do not start to get better or if they get worse. You may need blood work done while you are taking this medication. This medication increases the risk of blood clots. People with heart, blood vessel, or blood clotting conditions are more likely to develop a blood clot. Other risk factors include advanced age, estrogen  use, tobacco use, lack of movement, and being overweight. This medication can decrease the response to a vaccine. If you need to get vaccinated, tell your care team if you have received this medication within the last year. Extra booster doses may be needed. Talk to your care team to see if a different  vaccination schedule is needed. If you have diabetes, you may get a falsely elevated blood sugar reading. Talk to your care team about how to check your blood sugar while taking this medication. What side effects may I notice from receiving this medication? Side effects that you should report to your care team as soon as possible: Allergic reactions--skin rash, itching, hives, swelling of the face, lips, tongue, or throat Blood clot--pain, swelling, or warmth in the leg, shortness of breath, chest pain Fever, neck pain or stiffness, sensitivity to light, headache, nausea, vomiting, confusion, which may be signs of meningitis Hemolytic anemia--unusual weakness or fatigue, dizziness, headache, trouble breathing, dark urine, yellowing skin or eyes Kidney injury--decrease in the amount of urine, swelling of the ankles, hands, or feet Low sodium level--muscle weakness, fatigue, dizziness, headache, confusion Shortness of breath or trouble breathing, cough, unusual weakness or fatigue, blue skin or lips Side effects that usually do not require medical attention (report these to your care team if they continue or are bothersome): Chills Diarrhea Fever Headache Nausea This list may not describe all possible side effects. Call your doctor for medical advice about side effects. You may report side effects to FDA at 1-800-FDA-1088. Where should I keep my medication? Keep out of the reach of children and pets. You will be instructed on how to store this medication. Get rid of any unused medication after the expiration date. To get rid of medications that are no longer needed or have expired: Take the medication to a medication take-back program. Check with your pharmacy or law enforcement to find a location. If you cannot return the medication, ask your pharmacist or care team how to get rid of this medication safely. NOTE: This sheet is a summary. It may not cover all possible information. If you have  questions about this medicine, talk to your doctor, pharmacist, or health care provider.  2024 Elsevier/Gold Standard (2023-06-13 00:00:00)

## 2023-09-15 NOTE — Progress Notes (Signed)
 Hematology and Oncology Follow Up Visit  [name] [id number] [birth year] 73 y.o. 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  She looks quite good.  She feels okay.  She really has had no problems with breathing outside of a cough.  This has been more of a chronic issue.  She is still dealing with the scleroderma.  Thankfully, this does not appear to be any worse.  Her aldolase levels have been coming down.  Her CRP is nice and low.  Her CK really is not nicely.  About a year ago, it was 575.  Today it was down to 72.  Her iron levels have been okay.  Today, her iron saturation is 33%.  She has had no issues with bleeding.  There is been no change in bowel or bladder habits..  She has done well with the IVIG.  Her IgG level back in December was 1358 mg/dL.  At the present time, I will have to say that her performance status is probably ECOG 1.   Medications:  Current Outpatient Medications:    acetaminophen (TYLENOL) 650 MG CR tablet, Take 650 mg by mouth daily as needed for pain., Disp: , Rfl:    diphenhydrAMINE (BENADRYL) 25 mg capsule, Take 25 mg by mouth every 6 (six) hours as needed for allergies. Allergies and before IVIG treatment., Disp: , Rfl:    ELDERBERRY PO, Take 1 oz by mouth 4 (four) times daily as needed. Liquid, Disp: , Rfl:    furosemide (LASIX) 20 MG tablet, Take 1 tablet (20 mg total) by mouth daily., Disp: 90 tablet, Rfl: 1   losartan (COZAAR) 25 MG tablet, Take 25 mg by mouth in the morning and at bedtime., Disp: , Rfl:    Multiple Vitamin (MULTIVITAMIN) capsule, Take 1 capsule by mouth daily., Disp: , Rfl:    mycophenolate (CELLCEPT) 500 MG  tablet, Take 1,500 mg by mouth 2 (two) times daily., Disp: , Rfl:    pantoprazole (PROTONIX) 40 MG tablet, Take 40 mg by mouth as needed., Disp: , Rfl:    pantoprazole (PROTONIX) 40 MG tablet, Take 1 tablet (40 mg total) by mouth 3 (three) times daily before meals., Disp: 270 tablet, Rfl: 3   sodium chloride (OCEAN) 0.65 % SOLN nasal spray, Place 1 spray into both nostrils as needed for congestion., Disp: , Rfl:    SYNTHROID 100 MCG tablet, Take 1 tablet (100 mcg total) by mouth daily before breakfast., Disp: 90 tablet, Rfl: 3   Vonoprazan Fumarate 20 MG TABS, Take 20 mg by mouth daily., Disp: 90 tablet, Rfl: 0 No current facility-administered medications for this visit.  Facility-Administered Medications Ordered in Other Visits:    acetaminophen (TYLENOL) tablet 650 mg, 650 mg, Oral, Once, [name], Jennifer Phi, MD   dextrose 5 % solution, , Intravenous, Continuous, [name], Jennifer Phi, MD, Last Rate: 10 mL/hr at 09/15/23 1027, New Bag at 09/15/23 1027   diphenhydrAMINE (BENADRYL) capsule 25 mg, 25 mg, Oral, Once, [name], Jennifer Phi, MD  Allergies:  Allergies  Allergen Reactions   Betadine [Povidone Iodine] Rash   Epinephrine Other (See Comments)    Severe headaches    Iodinated Contrast Media Other (See Comments) and Rash    "  per pt: recently had CT 08/ 2018 even through given pre-medication, IVP still caused rash"  "per pt recently had CT 08/ 2018 even through given pre-medication, IVP still caused rash"   Iodine Rash and Other (See Comments)   Oxycodone Anxiety   Prednisone Anaphylaxis    Per Nephrology pt should avoid this medication because she has Scleroderma and it will interfere with her Kidneys,    Gabapentin Other (See Comments)    Dizziness   Lidocaine Rash   Nickel Rash and Other (See Comments)    Rash and blisters   Other Other (See Comments) and Rash    Hospital gown   Penicillins Rash and Other (See Comments)    Rash and blisters Has patient had a PCN reaction causing  immediate rash, facial/tongue/throat swelling, SOB or lightheadedness with hypotension: Yes Has patient had a PCN reaction causing severe rash involving mucus membranes or skin necrosis: No Has patient had a PCN reaction that required hospitalization: No Has patient had a PCN reaction occurring within the last 10 years: No If all of the above answers are "NO", then may proceed with Cephalosporin use.    Sulfa Antibiotics Rash    Past Medical History, Surgical history, Social history, and Family History were reviewed and updated.  Review of Systems: Review of Systems  Constitutional:  Positive for fatigue.  HENT:  Negative.    Eyes: Negative.   Respiratory: Negative.    Cardiovascular: Negative.   Gastrointestinal: Negative.   Endocrine: Negative.   Genitourinary: Negative.    Musculoskeletal:  Positive for arthralgias and myalgias.  Skin:  Positive for rash.  Neurological: Negative.   Hematological: Negative.   Psychiatric/Behavioral: Negative.      Physical Exam: Temperature is 97.7.  Pulse 78.  Blood pressure 141/59.  Weight is 133 pounds.      Wt Readings from Last 3 Encounters:  09/15/23 133 lb (60.3 kg)  08/18/23 132 lb (59.9 kg)  08/04/23 131 lb (59.4 kg)    Physical Exam Vitals reviewed.  Constitutional:      Comments: Her breast exam shows right breast with no masses, edema or erythema.  There is no right axillary adenopathy.  Left breast shows the lumpectomy scar at about the 2 o'clock position.  There is little firmness at the lumpectomy site.  No distinct masses noted.  There is no left axillary adenopathy.  There is no left nipple discharge.  HENT:     Head: Normocephalic and atraumatic.  Eyes:     Pupils: Pupils are equal, round, and reactive to light.  Cardiovascular:     Rate and Rhythm: Normal rate and regular rhythm.     Heart sounds: Normal heart sounds.  Pulmonary:     Effort: Pulmonary effort is normal.     Breath sounds: Normal breath sounds.   Abdominal:     General: Bowel sounds are normal.     Palpations: Abdomen is soft.  Musculoskeletal:        General: No tenderness or deformity. Normal range of motion.     Cervical back: Normal range of motion.  Lymphadenopathy:     Cervical: No cervical adenopathy.  Skin:    General: Skin is warm and dry.     Findings: No erythema or rash.     Comments: Skin exam shows some changes of the scleroderma.  This is mostly on the face, around the lip area.  Her arms do show a little bit of skin thickening.  Neurological:  Mental Status: She is alert and oriented to person, place, and time.  Psychiatric:        Behavior: Behavior normal.        Thought Content: Thought content normal.        Judgment: Judgment normal.      Lab Results  Component Value Date   WBC 7.6 09/15/2023   HGB 9.7 (L) 09/15/2023   HCT 30.7 (L) 09/15/2023   MCV 93.0 09/15/2023   PLT 218 09/15/2023     Chemistry      Component Value Date/Time   NA 137 09/15/2023 0850   NA 144 01/27/2020 1148   NA 141 06/09/2017 1117   K 3.4 (L) 09/15/2023 0850   K 3.9 06/09/2017 1117   CL 103 09/15/2023 0850   CL 109 (H) 12/14/2012 1253   CO2 26 09/15/2023 0850   CO2 23 06/09/2017 1117   BUN 17 09/15/2023 0850   BUN 11 01/27/2020 1148   BUN 11.5 06/09/2017 1117   CREATININE 1.15 (H) 09/15/2023 0850   CREATININE 1.2 (H) 06/09/2017 1117      Component Value Date/Time   CALCIUM 9.3 09/15/2023 0850   CALCIUM 8.9 06/09/2017 1117   ALKPHOS 61 09/15/2023 0850   ALKPHOS 40 06/09/2017 1117   AST 16 09/15/2023 0850   AST 35 (H) 06/09/2017 1117   ALT 7 09/15/2023 0850   ALT 19 06/09/2017 1117   BILITOT 0.4 09/15/2023 0850   BILITOT 0.37 06/09/2017 1117      Impression and Plan: Ms. Elman is a very charming 73 year old white female.  She has had a history of early stage breast cancer.    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  She is on IVIG.  This does seem to be helping her.  Her tumor markers  that we are using-CK, aldolase, and C-reactive protein are all better.  Again I do not see any problems with respect to the breast cancer.  Her CA 27.25 is would be on the higher side.  We last checked it was 38.    Again she does well with the IVIG.  She gets is 5 days in a row.  We have moved her appointments out every 6 weeks.  We will keep her at every 6 weeks and then we can certainly see about moving her out even further.  Josph Macho, MD 3/3/20252:05 PM

## 2023-09-16 ENCOUNTER — Inpatient Hospital Stay: Payer: Medicare Other

## 2023-09-16 VITALS — BP 116/57 | HR 86 | Temp 97.0°F | Resp 17

## 2023-09-16 DIAGNOSIS — C50912 Malignant neoplasm of unspecified site of left female breast: Secondary | ICD-10-CM | POA: Diagnosis not present

## 2023-09-16 DIAGNOSIS — R21 Rash and other nonspecific skin eruption: Secondary | ICD-10-CM | POA: Diagnosis not present

## 2023-09-16 DIAGNOSIS — D649 Anemia, unspecified: Secondary | ICD-10-CM | POA: Diagnosis not present

## 2023-09-16 DIAGNOSIS — R978 Other abnormal tumor markers: Secondary | ICD-10-CM | POA: Diagnosis not present

## 2023-09-16 DIAGNOSIS — D509 Iron deficiency anemia, unspecified: Secondary | ICD-10-CM

## 2023-09-16 DIAGNOSIS — M349 Systemic sclerosis, unspecified: Secondary | ICD-10-CM | POA: Diagnosis not present

## 2023-09-16 DIAGNOSIS — M3481 Systemic sclerosis with lung involvement: Secondary | ICD-10-CM | POA: Diagnosis not present

## 2023-09-16 DIAGNOSIS — Z923 Personal history of irradiation: Secondary | ICD-10-CM | POA: Diagnosis not present

## 2023-09-16 LAB — KAPPA/LAMBDA LIGHT CHAINS
Kappa free light chain: 15.1 mg/L (ref 3.3–19.4)
Kappa, lambda light chain ratio: 1.35 (ref 0.26–1.65)
Lambda free light chains: 11.2 mg/L (ref 5.7–26.3)

## 2023-09-16 LAB — IGG, IGA, IGM
IgA: 132 mg/dL (ref 64–422)
IgG (Immunoglobin G), Serum: 1241 mg/dL (ref 586–1602)
IgM (Immunoglobulin M), Srm: 53 mg/dL (ref 26–217)

## 2023-09-16 LAB — ALDOLASE: Aldolase: 4 U/L (ref 3.3–10.3)

## 2023-09-16 LAB — CANCER ANTIGEN 27.29: CA 27.29: 40.5 U/mL — ABNORMAL HIGH (ref 0.0–38.6)

## 2023-09-16 MED ORDER — IMMUNE GLOBULIN (HUMAN) 20 GM/200ML IV SOLN
25.0000 g | Freq: Once | INTRAVENOUS | Status: AC
  Administered 2023-09-16: 25 g via INTRAVENOUS
  Filled 2023-09-16: qty 250

## 2023-09-16 MED ORDER — ACETAMINOPHEN 325 MG PO TABS: 650.0000 mg | ORAL_TABLET | Freq: Once | ORAL | Status: DC

## 2023-09-16 MED ORDER — DIPHENHYDRAMINE HCL 25 MG PO CAPS: 25.0000 mg | ORAL_CAPSULE | Freq: Once | ORAL | Status: DC

## 2023-09-16 MED ORDER — DEXTROSE 5 % IV SOLN: INTRAVENOUS | Status: DC

## 2023-09-16 NOTE — Patient Instructions (Signed)
 Immune Globulin Injection What is this medication? IMMUNE GLOBULIN (im MUNE GLOB yoo lin) treats many immune system conditions. It works by Designer, multimedia extra antibodies. Antibodies are proteins made by the immune system that help protect the body. This medicine may be used for other purposes; ask your health care provider or pharmacist if you have questions. COMMON BRAND NAME(S): ASCENIV, Baygam, BIVIGAM, Carimune, Carimune NF, cutaquig, Cuvitru, Flebogamma, Flebogamma DIF, GamaSTAN, GamaSTAN S/D, Gamimune N, Gammagard, Gammagard S/D, Gammaked, Gammaplex, Gammar-P IV, Gamunex, Gamunex-C, Hizentra, Iveegam, Iveegam EN, Octagam, Panglobulin, Panglobulin NF, panzyga, Polygam S/D, Privigen, Sandoglobulin, Venoglobulin-S, Vigam, Vivaglobulin, Xembify What should I tell my care team before I take this medication? They need to know if you have any of these conditions: Blood clotting disorder Condition where you have excess fluid in your body, such as heart failure or edema Dehydration Diabetes Have had blood clots Heart disease Immune system conditions Kidney disease Low levels of IgA Recent or upcoming vaccine An unusual or allergic reaction to immune globulin, other medications, foods, dyes, or preservatives Pregnant or trying to get pregnant Breastfeeding How should I use this medication? This medication is infused into a vein or under the skin. It is usually given by your care team in a hospital or clinic setting. It may also be given at home. If you get this medication at home, you will be taught how to prepare and give it. Use exactly as directed. Take it as directed on the prescription label at the same time every day. Keep taking it unless your care team tells you to stop. It is important that you put your used needles and syringes in a special sharps container. Do not put them in a trash can. If you do not have a sharps container, call your pharmacist or care team to get one. Talk to  your care team about the use of this medication in children. While it may be given to children for selected conditions, precautions do apply. Overdosage: If you think you have taken too much of this medicine contact a poison control center or emergency room at once. NOTE: This medicine is only for you. Do not share this medicine with others. What if I miss a dose? If you get this medication at the hospital or clinic: It is important not to miss your dose. Call your care team if you are unable to keep an appointment. If you give yourself this medication at home: If you miss a dose, take it as soon as you can. Then continue your normal schedule. If it is almost time for your next dose, take only that dose. Do not take double or extra doses. Call your care team with questions. What may interact with this medication? Live virus vaccines This list may not describe all possible interactions. Give your health care provider a list of all the medicines, herbs, non-prescription drugs, or dietary supplements you use. Also tell them if you smoke, drink alcohol, or use illegal drugs. Some items may interact with your medicine. What should I watch for while using this medication? Your condition will be monitored carefully while you are receiving this medication. Tell your care team if your symptoms do not start to get better or if they get worse. You may need blood work done while you are taking this medication. This medication increases the risk of blood clots. People with heart, blood vessel, or blood clotting conditions are more likely to develop a blood clot. Other risk factors include advanced age, estrogen  use, tobacco use, lack of movement, and being overweight. This medication can decrease the response to a vaccine. If you need to get vaccinated, tell your care team if you have received this medication within the last year. Extra booster doses may be needed. Talk to your care team to see if a different  vaccination schedule is needed. If you have diabetes, you may get a falsely elevated blood sugar reading. Talk to your care team about how to check your blood sugar while taking this medication. What side effects may I notice from receiving this medication? Side effects that you should report to your care team as soon as possible: Allergic reactions--skin rash, itching, hives, swelling of the face, lips, tongue, or throat Blood clot--pain, swelling, or warmth in the leg, shortness of breath, chest pain Fever, neck pain or stiffness, sensitivity to light, headache, nausea, vomiting, confusion, which may be signs of meningitis Hemolytic anemia--unusual weakness or fatigue, dizziness, headache, trouble breathing, dark urine, yellowing skin or eyes Kidney injury--decrease in the amount of urine, swelling of the ankles, hands, or feet Low sodium level--muscle weakness, fatigue, dizziness, headache, confusion Shortness of breath or trouble breathing, cough, unusual weakness or fatigue, blue skin or lips Side effects that usually do not require medical attention (report these to your care team if they continue or are bothersome): Chills Diarrhea Fever Headache Nausea This list may not describe all possible side effects. Call your doctor for medical advice about side effects. You may report side effects to FDA at 1-800-FDA-1088. Where should I keep my medication? Keep out of the reach of children and pets. You will be instructed on how to store this medication. Get rid of any unused medication after the expiration date. To get rid of medications that are no longer needed or have expired: Take the medication to a medication take-back program. Check with your pharmacy or law enforcement to find a location. If you cannot return the medication, ask your pharmacist or care team how to get rid of this medication safely. NOTE: This sheet is a summary. It may not cover all possible information. If you have  questions about this medicine, talk to your doctor, pharmacist, or health care provider.  2024 Elsevier/Gold Standard (2023-06-13 00:00:00)

## 2023-09-17 ENCOUNTER — Inpatient Hospital Stay: Payer: Medicare Other

## 2023-09-17 VITALS — BP 131/66 | HR 79 | Temp 97.8°F | Resp 16

## 2023-09-17 DIAGNOSIS — D509 Iron deficiency anemia, unspecified: Secondary | ICD-10-CM

## 2023-09-17 DIAGNOSIS — Z923 Personal history of irradiation: Secondary | ICD-10-CM | POA: Diagnosis not present

## 2023-09-17 DIAGNOSIS — M349 Systemic sclerosis, unspecified: Secondary | ICD-10-CM | POA: Diagnosis not present

## 2023-09-17 DIAGNOSIS — D649 Anemia, unspecified: Secondary | ICD-10-CM | POA: Diagnosis not present

## 2023-09-17 DIAGNOSIS — R978 Other abnormal tumor markers: Secondary | ICD-10-CM | POA: Diagnosis not present

## 2023-09-17 DIAGNOSIS — C50912 Malignant neoplasm of unspecified site of left female breast: Secondary | ICD-10-CM | POA: Diagnosis not present

## 2023-09-17 DIAGNOSIS — M3481 Systemic sclerosis with lung involvement: Secondary | ICD-10-CM | POA: Diagnosis not present

## 2023-09-17 DIAGNOSIS — R21 Rash and other nonspecific skin eruption: Secondary | ICD-10-CM | POA: Diagnosis not present

## 2023-09-17 MED ORDER — IMMUNE GLOBULIN (HUMAN) 20 GM/200ML IV SOLN
25.0000 g | Freq: Once | INTRAVENOUS | Status: AC
  Administered 2023-09-17: 25 g via INTRAVENOUS
  Filled 2023-09-17: qty 250

## 2023-09-17 MED ORDER — ACETAMINOPHEN 325 MG PO TABS
650.0000 mg | ORAL_TABLET | Freq: Once | ORAL | Status: DC
Start: 2023-09-17 — End: 2023-09-17

## 2023-09-17 MED ORDER — DIPHENHYDRAMINE HCL 25 MG PO CAPS: 25.0000 mg | ORAL_CAPSULE | Freq: Once | ORAL | Status: DC

## 2023-09-17 MED ORDER — DEXTROSE 5 % IV SOLN: INTRAVENOUS | Status: DC

## 2023-09-18 ENCOUNTER — Inpatient Hospital Stay: Payer: Medicare Other

## 2023-09-18 VITALS — BP 142/65 | HR 85 | Temp 98.0°F | Resp 17

## 2023-09-18 DIAGNOSIS — D509 Iron deficiency anemia, unspecified: Secondary | ICD-10-CM

## 2023-09-18 DIAGNOSIS — C50912 Malignant neoplasm of unspecified site of left female breast: Secondary | ICD-10-CM | POA: Diagnosis not present

## 2023-09-18 DIAGNOSIS — D649 Anemia, unspecified: Secondary | ICD-10-CM | POA: Diagnosis not present

## 2023-09-18 DIAGNOSIS — M349 Systemic sclerosis, unspecified: Secondary | ICD-10-CM | POA: Diagnosis not present

## 2023-09-18 DIAGNOSIS — M3481 Systemic sclerosis with lung involvement: Secondary | ICD-10-CM | POA: Diagnosis not present

## 2023-09-18 DIAGNOSIS — Z923 Personal history of irradiation: Secondary | ICD-10-CM | POA: Diagnosis not present

## 2023-09-18 DIAGNOSIS — R978 Other abnormal tumor markers: Secondary | ICD-10-CM | POA: Diagnosis not present

## 2023-09-18 DIAGNOSIS — R21 Rash and other nonspecific skin eruption: Secondary | ICD-10-CM | POA: Diagnosis not present

## 2023-09-18 MED ORDER — ACETAMINOPHEN 325 MG PO TABS: 650.0000 mg | ORAL_TABLET | Freq: Once | ORAL | Status: DC

## 2023-09-18 MED ORDER — DEXTROSE 5 % IV SOLN: INTRAVENOUS | Status: DC

## 2023-09-18 MED ORDER — DIPHENHYDRAMINE HCL 25 MG PO CAPS: 25.0000 mg | ORAL_CAPSULE | Freq: Once | ORAL | Status: DC

## 2023-09-18 MED ORDER — IMMUNE GLOBULIN (HUMAN) 20 GM/200ML IV SOLN
25.0000 g | Freq: Once | INTRAVENOUS | Status: AC
  Administered 2023-09-18: 25 g via INTRAVENOUS
  Filled 2023-09-18: qty 200

## 2023-09-18 MED ORDER — HEPARIN SOD (PORK) LOCK FLUSH 100 UNIT/ML IV SOLN
500.0000 [IU] | Freq: Once | INTRAVENOUS | Status: AC
Start: 2023-09-18 — End: 2023-09-18
  Administered 2023-09-18: 500 [IU] via INTRAVENOUS

## 2023-09-18 MED ORDER — SODIUM CHLORIDE 0.9% FLUSH
10.0000 mL | INTRAVENOUS | Status: DC | PRN
  Administered 2023-09-18: 10 mL via INTRAVENOUS

## 2023-09-18 MED ORDER — DARBEPOETIN ALFA 300 MCG/0.6ML IJ SOSY
300.0000 ug | PREFILLED_SYRINGE | Freq: Once | INTRAMUSCULAR | Status: DC
Start: 2023-09-18 — End: 2023-09-18

## 2023-09-18 NOTE — Patient Instructions (Signed)
 Immune Globulin Injection What is this medication? IMMUNE GLOBULIN (im MUNE GLOB yoo lin) treats many immune system conditions. It works by Designer, multimedia extra antibodies. Antibodies are proteins made by the immune system that help protect the body. This medicine may be used for other purposes; ask your health care provider or pharmacist if you have questions. COMMON BRAND NAME(S): ASCENIV, Baygam, BIVIGAM, Carimune, Carimune NF, cutaquig, Cuvitru, Flebogamma, Flebogamma DIF, GamaSTAN, GamaSTAN S/D, Gamimune N, Gammagard, Gammagard S/D, Gammaked, Gammaplex, Gammar-P IV, Gamunex, Gamunex-C, Hizentra, Iveegam, Iveegam EN, Octagam, Panglobulin, Panglobulin NF, panzyga, Polygam S/D, Privigen, Sandoglobulin, Venoglobulin-S, Vigam, Vivaglobulin, Xembify What should I tell my care team before I take this medication? They need to know if you have any of these conditions: Blood clotting disorder Condition where you have excess fluid in your body, such as heart failure or edema Dehydration Diabetes Have had blood clots Heart disease Immune system conditions Kidney disease Low levels of IgA Recent or upcoming vaccine An unusual or allergic reaction to immune globulin, other medications, foods, dyes, or preservatives Pregnant or trying to get pregnant Breastfeeding How should I use this medication? This medication is infused into a vein or under the skin. It is usually given by your care team in a hospital or clinic setting. It may also be given at home. If you get this medication at home, you will be taught how to prepare and give it. Use exactly as directed. Take it as directed on the prescription label at the same time every day. Keep taking it unless your care team tells you to stop. It is important that you put your used needles and syringes in a special sharps container. Do not put them in a trash can. If you do not have a sharps container, call your pharmacist or care team to get one. Talk to  your care team about the use of this medication in children. While it may be given to children for selected conditions, precautions do apply. Overdosage: If you think you have taken too much of this medicine contact a poison control center or emergency room at once. NOTE: This medicine is only for you. Do not share this medicine with others. What if I miss a dose? If you get this medication at the hospital or clinic: It is important not to miss your dose. Call your care team if you are unable to keep an appointment. If you give yourself this medication at home: If you miss a dose, take it as soon as you can. Then continue your normal schedule. If it is almost time for your next dose, take only that dose. Do not take double or extra doses. Call your care team with questions. What may interact with this medication? Live virus vaccines This list may not describe all possible interactions. Give your health care provider a list of all the medicines, herbs, non-prescription drugs, or dietary supplements you use. Also tell them if you smoke, drink alcohol, or use illegal drugs. Some items may interact with your medicine. What should I watch for while using this medication? Your condition will be monitored carefully while you are receiving this medication. Tell your care team if your symptoms do not start to get better or if they get worse. You may need blood work done while you are taking this medication. This medication increases the risk of blood clots. People with heart, blood vessel, or blood clotting conditions are more likely to develop a blood clot. Other risk factors include advanced age, estrogen  use, tobacco use, lack of movement, and being overweight. This medication can decrease the response to a vaccine. If you need to get vaccinated, tell your care team if you have received this medication within the last year. Extra booster doses may be needed. Talk to your care team to see if a different  vaccination schedule is needed. If you have diabetes, you may get a falsely elevated blood sugar reading. Talk to your care team about how to check your blood sugar while taking this medication. What side effects may I notice from receiving this medication? Side effects that you should report to your care team as soon as possible: Allergic reactions--skin rash, itching, hives, swelling of the face, lips, tongue, or throat Blood clot--pain, swelling, or warmth in the leg, shortness of breath, chest pain Fever, neck pain or stiffness, sensitivity to light, headache, nausea, vomiting, confusion, which may be signs of meningitis Hemolytic anemia--unusual weakness or fatigue, dizziness, headache, trouble breathing, dark urine, yellowing skin or eyes Kidney injury--decrease in the amount of urine, swelling of the ankles, hands, or feet Low sodium level--muscle weakness, fatigue, dizziness, headache, confusion Shortness of breath or trouble breathing, cough, unusual weakness or fatigue, blue skin or lips Side effects that usually do not require medical attention (report these to your care team if they continue or are bothersome): Chills Diarrhea Fever Headache Nausea This list may not describe all possible side effects. Call your doctor for medical advice about side effects. You may report side effects to FDA at 1-800-FDA-1088. Where should I keep my medication? Keep out of the reach of children and pets. You will be instructed on how to store this medication. Get rid of any unused medication after the expiration date. To get rid of medications that are no longer needed or have expired: Take the medication to a medication take-back program. Check with your pharmacy or law enforcement to find a location. If you cannot return the medication, ask your pharmacist or care team how to get rid of this medication safely. NOTE: This sheet is a summary. It may not cover all possible information. If you have  questions about this medicine, talk to your doctor, pharmacist, or health care provider.  2024 Elsevier/Gold Standard (2023-06-13 00:00:00)

## 2023-09-19 ENCOUNTER — Inpatient Hospital Stay: Payer: Medicare Other

## 2023-09-19 VITALS — BP 135/62 | HR 65 | Temp 98.0°F | Resp 17

## 2023-09-19 DIAGNOSIS — Z860100 Personal history of colon polyps, unspecified: Secondary | ICD-10-CM

## 2023-09-19 DIAGNOSIS — R21 Rash and other nonspecific skin eruption: Secondary | ICD-10-CM | POA: Diagnosis not present

## 2023-09-19 DIAGNOSIS — Z923 Personal history of irradiation: Secondary | ICD-10-CM | POA: Diagnosis not present

## 2023-09-19 DIAGNOSIS — D509 Iron deficiency anemia, unspecified: Secondary | ICD-10-CM

## 2023-09-19 DIAGNOSIS — M349 Systemic sclerosis, unspecified: Secondary | ICD-10-CM

## 2023-09-19 DIAGNOSIS — D649 Anemia, unspecified: Secondary | ICD-10-CM | POA: Diagnosis not present

## 2023-09-19 DIAGNOSIS — N1831 Chronic kidney disease, stage 3a: Secondary | ICD-10-CM

## 2023-09-19 DIAGNOSIS — C50612 Malignant neoplasm of axillary tail of left female breast: Secondary | ICD-10-CM

## 2023-09-19 DIAGNOSIS — M3481 Systemic sclerosis with lung involvement: Secondary | ICD-10-CM | POA: Diagnosis not present

## 2023-09-19 DIAGNOSIS — Z170 Estrogen receptor positive status [ER+]: Secondary | ICD-10-CM

## 2023-09-19 DIAGNOSIS — C50912 Malignant neoplasm of unspecified site of left female breast: Secondary | ICD-10-CM | POA: Diagnosis not present

## 2023-09-19 DIAGNOSIS — Z95828 Presence of other vascular implants and grafts: Secondary | ICD-10-CM

## 2023-09-19 DIAGNOSIS — R978 Other abnormal tumor markers: Secondary | ICD-10-CM | POA: Diagnosis not present

## 2023-09-19 MED ORDER — SODIUM CHLORIDE 0.9% FLUSH
10.0000 mL | Freq: Once | INTRAVENOUS | Status: AC
  Administered 2023-09-19: 10 mL via INTRAVENOUS

## 2023-09-19 MED ORDER — ACETAMINOPHEN 325 MG PO TABS: 650.0000 mg | ORAL_TABLET | Freq: Once | ORAL | Status: DC

## 2023-09-19 MED ORDER — IMMUNE GLOBULIN (HUMAN) 20 GM/200ML IV SOLN
25.0000 g | Freq: Once | INTRAVENOUS | Status: AC
  Administered 2023-09-19: 25 g via INTRAVENOUS
  Filled 2023-09-19: qty 250

## 2023-09-19 MED ORDER — DIPHENHYDRAMINE HCL 25 MG PO CAPS: 25.0000 mg | ORAL_CAPSULE | Freq: Once | ORAL | Status: DC

## 2023-09-19 MED ORDER — HEPARIN SOD (PORK) LOCK FLUSH 100 UNIT/ML IV SOLN
500.0000 [IU] | Freq: Once | INTRAVENOUS | Status: AC
  Administered 2023-09-19: 500 [IU] via INTRAVENOUS

## 2023-09-19 MED ORDER — DEXTROSE 5 % IV SOLN: INTRAVENOUS | Status: DC

## 2023-09-19 NOTE — Patient Instructions (Signed)
 Immune Globulin Injection What is this medication? IMMUNE GLOBULIN (im MUNE GLOB yoo lin) treats many immune system conditions. It works by Designer, multimedia extra antibodies. Antibodies are proteins made by the immune system that help protect the body. This medicine may be used for other purposes; ask your health care provider or pharmacist if you have questions. COMMON BRAND NAME(S): ASCENIV, Baygam, BIVIGAM, Carimune, Carimune NF, cutaquig, Cuvitru, Flebogamma, Flebogamma DIF, GamaSTAN, GamaSTAN S/D, Gamimune N, Gammagard, Gammagard S/D, Gammaked, Gammaplex, Gammar-P IV, Gamunex, Gamunex-C, Hizentra, Iveegam, Iveegam EN, Octagam, Panglobulin, Panglobulin NF, panzyga, Polygam S/D, Privigen, Sandoglobulin, Venoglobulin-S, Vigam, Vivaglobulin, Xembify What should I tell my care team before I take this medication? They need to know if you have any of these conditions: Blood clotting disorder Condition where you have excess fluid in your body, such as heart failure or edema Dehydration Diabetes Have had blood clots Heart disease Immune system conditions Kidney disease Low levels of IgA Recent or upcoming vaccine An unusual or allergic reaction to immune globulin, other medications, foods, dyes, or preservatives Pregnant or trying to get pregnant Breastfeeding How should I use this medication? This medication is infused into a vein or under the skin. It is usually given by your care team in a hospital or clinic setting. It may also be given at home. If you get this medication at home, you will be taught how to prepare and give it. Use exactly as directed. Take it as directed on the prescription label at the same time every day. Keep taking it unless your care team tells you to stop. It is important that you put your used needles and syringes in a special sharps container. Do not put them in a trash can. If you do not have a sharps container, call your pharmacist or care team to get one. Talk to  your care team about the use of this medication in children. While it may be given to children for selected conditions, precautions do apply. Overdosage: If you think you have taken too much of this medicine contact a poison control center or emergency room at once. NOTE: This medicine is only for you. Do not share this medicine with others. What if I miss a dose? If you get this medication at the hospital or clinic: It is important not to miss your dose. Call your care team if you are unable to keep an appointment. If you give yourself this medication at home: If you miss a dose, take it as soon as you can. Then continue your normal schedule. If it is almost time for your next dose, take only that dose. Do not take double or extra doses. Call your care team with questions. What may interact with this medication? Live virus vaccines This list may not describe all possible interactions. Give your health care provider a list of all the medicines, herbs, non-prescription drugs, or dietary supplements you use. Also tell them if you smoke, drink alcohol, or use illegal drugs. Some items may interact with your medicine. What should I watch for while using this medication? Your condition will be monitored carefully while you are receiving this medication. Tell your care team if your symptoms do not start to get better or if they get worse. You may need blood work done while you are taking this medication. This medication increases the risk of blood clots. People with heart, blood vessel, or blood clotting conditions are more likely to develop a blood clot. Other risk factors include advanced age, estrogen  use, tobacco use, lack of movement, and being overweight. This medication can decrease the response to a vaccine. If you need to get vaccinated, tell your care team if you have received this medication within the last year. Extra booster doses may be needed. Talk to your care team to see if a different  vaccination schedule is needed. If you have diabetes, you may get a falsely elevated blood sugar reading. Talk to your care team about how to check your blood sugar while taking this medication. What side effects may I notice from receiving this medication? Side effects that you should report to your care team as soon as possible: Allergic reactions--skin rash, itching, hives, swelling of the face, lips, tongue, or throat Blood clot--pain, swelling, or warmth in the leg, shortness of breath, chest pain Fever, neck pain or stiffness, sensitivity to light, headache, nausea, vomiting, confusion, which may be signs of meningitis Hemolytic anemia--unusual weakness or fatigue, dizziness, headache, trouble breathing, dark urine, yellowing skin or eyes Kidney injury--decrease in the amount of urine, swelling of the ankles, hands, or feet Low sodium level--muscle weakness, fatigue, dizziness, headache, confusion Shortness of breath or trouble breathing, cough, unusual weakness or fatigue, blue skin or lips Side effects that usually do not require medical attention (report these to your care team if they continue or are bothersome): Chills Diarrhea Fever Headache Nausea This list may not describe all possible side effects. Call your doctor for medical advice about side effects. You may report side effects to FDA at 1-800-FDA-1088. Where should I keep my medication? Keep out of the reach of children and pets. You will be instructed on how to store this medication. Get rid of any unused medication after the expiration date. To get rid of medications that are no longer needed or have expired: Take the medication to a medication take-back program. Check with your pharmacy or law enforcement to find a location. If you cannot return the medication, ask your pharmacist or care team how to get rid of this medication safely. NOTE: This sheet is a summary. It may not cover all possible information. If you have  questions about this medicine, talk to your doctor, pharmacist, or health care provider.  2024 Elsevier/Gold Standard (2023-06-13 00:00:00)

## 2023-09-24 ENCOUNTER — Telehealth: Payer: Self-pay | Admitting: Internal Medicine

## 2023-09-24 NOTE — Telephone Encounter (Signed)
 Patient called and stated that she has called several time to schedule a follow up appointment with Dr. Rhea Belton. Patient stated that she was not wanting to schedule with anyone else except Dr. Rhea Belton. Patient is very upset at the fact that she is not able to schedule an appointment with him. Patient is requesting a call back. Please advise.

## 2023-09-24 NOTE — Telephone Encounter (Signed)
 Pt scheduled to see Dr Rhea Belton 12/03/23 at 2:30pm. Pt aware of appt.

## 2023-10-07 DIAGNOSIS — D84821 Immunodeficiency due to drugs: Secondary | ICD-10-CM | POA: Diagnosis not present

## 2023-10-07 DIAGNOSIS — J849 Interstitial pulmonary disease, unspecified: Secondary | ICD-10-CM | POA: Diagnosis not present

## 2023-10-07 DIAGNOSIS — Z79899 Other long term (current) drug therapy: Secondary | ICD-10-CM | POA: Diagnosis not present

## 2023-10-07 DIAGNOSIS — R0600 Dyspnea, unspecified: Secondary | ICD-10-CM | POA: Diagnosis not present

## 2023-10-07 DIAGNOSIS — M609 Myositis, unspecified: Secondary | ICD-10-CM | POA: Diagnosis not present

## 2023-10-07 DIAGNOSIS — M349 Systemic sclerosis, unspecified: Secondary | ICD-10-CM | POA: Diagnosis not present

## 2023-10-08 ENCOUNTER — Encounter: Payer: Self-pay | Admitting: Internal Medicine

## 2023-10-08 DIAGNOSIS — J849 Interstitial pulmonary disease, unspecified: Secondary | ICD-10-CM | POA: Diagnosis not present

## 2023-10-08 DIAGNOSIS — Z79899 Other long term (current) drug therapy: Secondary | ICD-10-CM | POA: Diagnosis not present

## 2023-10-08 DIAGNOSIS — R9389 Abnormal findings on diagnostic imaging of other specified body structures: Secondary | ICD-10-CM | POA: Diagnosis not present

## 2023-10-08 DIAGNOSIS — I272 Pulmonary hypertension, unspecified: Secondary | ICD-10-CM | POA: Diagnosis not present

## 2023-10-08 DIAGNOSIS — M349 Systemic sclerosis, unspecified: Secondary | ICD-10-CM | POA: Diagnosis not present

## 2023-10-09 NOTE — Telephone Encounter (Signed)
 Please renew the vonoprazan at current dose and I will see her in May as scheduled

## 2023-10-10 ENCOUNTER — Other Ambulatory Visit: Payer: Self-pay

## 2023-10-10 DIAGNOSIS — K209 Esophagitis, unspecified without bleeding: Secondary | ICD-10-CM

## 2023-10-10 DIAGNOSIS — K222 Esophageal obstruction: Secondary | ICD-10-CM

## 2023-10-10 MED ORDER — VONOPRAZAN FUMARATE 20 MG PO TABS: 20.0000 mg | ORAL_TABLET | Freq: Every day | ORAL | 0 refills | Status: DC

## 2023-10-20 ENCOUNTER — Encounter: Payer: Self-pay | Admitting: Hematology & Oncology

## 2023-10-27 ENCOUNTER — Other Ambulatory Visit: Payer: Self-pay

## 2023-10-27 ENCOUNTER — Inpatient Hospital Stay

## 2023-10-27 ENCOUNTER — Inpatient Hospital Stay: Attending: Hematology & Oncology

## 2023-10-27 ENCOUNTER — Encounter: Payer: Self-pay | Admitting: Hematology & Oncology

## 2023-10-27 ENCOUNTER — Inpatient Hospital Stay (HOSPITAL_BASED_OUTPATIENT_CLINIC_OR_DEPARTMENT_OTHER): Admitting: Hematology & Oncology

## 2023-10-27 VITALS — BP 134/60 | HR 89 | Resp 16 | Ht 64.0 in | Wt 136.0 lb

## 2023-10-27 VITALS — BP 111/59 | HR 83

## 2023-10-27 DIAGNOSIS — I7300 Raynaud's syndrome without gangrene: Secondary | ICD-10-CM | POA: Diagnosis not present

## 2023-10-27 DIAGNOSIS — D509 Iron deficiency anemia, unspecified: Secondary | ICD-10-CM

## 2023-10-27 DIAGNOSIS — D631 Anemia in chronic kidney disease: Secondary | ICD-10-CM | POA: Insufficient documentation

## 2023-10-27 DIAGNOSIS — Z79899 Other long term (current) drug therapy: Secondary | ICD-10-CM | POA: Diagnosis not present

## 2023-10-27 DIAGNOSIS — Z860100 Personal history of colon polyps, unspecified: Secondary | ICD-10-CM

## 2023-10-27 DIAGNOSIS — C50412 Malignant neoplasm of upper-outer quadrant of left female breast: Secondary | ICD-10-CM

## 2023-10-27 DIAGNOSIS — M3481 Systemic sclerosis with lung involvement: Secondary | ICD-10-CM | POA: Diagnosis not present

## 2023-10-27 DIAGNOSIS — Z170 Estrogen receptor positive status [ER+]: Secondary | ICD-10-CM

## 2023-10-27 DIAGNOSIS — C50912 Malignant neoplasm of unspecified site of left female breast: Secondary | ICD-10-CM | POA: Diagnosis not present

## 2023-10-27 DIAGNOSIS — D649 Anemia, unspecified: Secondary | ICD-10-CM | POA: Diagnosis not present

## 2023-10-27 DIAGNOSIS — N1831 Chronic kidney disease, stage 3a: Secondary | ICD-10-CM | POA: Insufficient documentation

## 2023-10-27 DIAGNOSIS — M349 Systemic sclerosis, unspecified: Secondary | ICD-10-CM | POA: Diagnosis not present

## 2023-10-27 DIAGNOSIS — Z923 Personal history of irradiation: Secondary | ICD-10-CM | POA: Insufficient documentation

## 2023-10-27 DIAGNOSIS — M3489 Other systemic sclerosis: Secondary | ICD-10-CM | POA: Insufficient documentation

## 2023-10-27 LAB — CBC WITH DIFFERENTIAL (CANCER CENTER ONLY)
Abs Immature Granulocytes: 0.03 10*3/uL (ref 0.00–0.07)
Basophils Absolute: 0 10*3/uL (ref 0.0–0.1)
Basophils Relative: 0 %
Eosinophils Absolute: 0.1 10*3/uL (ref 0.0–0.5)
Eosinophils Relative: 2 %
HCT: 27.8 % — ABNORMAL LOW (ref 36.0–46.0)
Hemoglobin: 9 g/dL — ABNORMAL LOW (ref 12.0–15.0)
Immature Granulocytes: 0 %
Lymphocytes Relative: 8 %
Lymphs Abs: 0.6 10*3/uL — ABNORMAL LOW (ref 0.7–4.0)
MCH: 29.8 pg (ref 26.0–34.0)
MCHC: 32.4 g/dL (ref 30.0–36.0)
MCV: 92.1 fL (ref 80.0–100.0)
Monocytes Absolute: 0.5 10*3/uL (ref 0.1–1.0)
Monocytes Relative: 8 %
Neutro Abs: 5.6 10*3/uL (ref 1.7–7.7)
Neutrophils Relative %: 82 %
Platelet Count: 274 10*3/uL (ref 150–400)
RBC: 3.02 MIL/uL — ABNORMAL LOW (ref 3.87–5.11)
RDW: 13.9 % (ref 11.5–15.5)
WBC Count: 6.9 10*3/uL (ref 4.0–10.5)
nRBC: 0 % (ref 0.0–0.2)

## 2023-10-27 LAB — CMP (CANCER CENTER ONLY)
ALT: 9 U/L (ref 0–44)
AST: 19 U/L (ref 15–41)
Albumin: 3.9 g/dL (ref 3.5–5.0)
Alkaline Phosphatase: 64 U/L (ref 38–126)
Anion gap: 9 (ref 5–15)
BUN: 16 mg/dL (ref 8–23)
CO2: 24 mmol/L (ref 22–32)
Calcium: 9 mg/dL (ref 8.9–10.3)
Chloride: 103 mmol/L (ref 98–111)
Creatinine: 1.02 mg/dL — ABNORMAL HIGH (ref 0.44–1.00)
GFR, Estimated: 58 mL/min — ABNORMAL LOW (ref >60)
Glucose, Bld: 109 mg/dL — ABNORMAL HIGH (ref 70–99)
Potassium: 3.4 mmol/L — ABNORMAL LOW (ref 3.5–5.1)
Sodium: 136 mmol/L (ref 135–145)
Total Bilirubin: 0.4 mg/dL (ref 0.0–1.2)
Total Protein: 6.6 g/dL (ref 6.5–8.1)

## 2023-10-27 LAB — LACTATE DEHYDROGENASE: LDH: 170 U/L (ref 98–192)

## 2023-10-27 LAB — C-REACTIVE PROTEIN: CRP: 1.3 mg/dL — ABNORMAL HIGH (ref <1.0)

## 2023-10-27 LAB — CK: Total CK: 74 U/L (ref 38–234)

## 2023-10-27 MED ORDER — DEXTROSE 5 % IV SOLN: INTRAVENOUS | Status: DC

## 2023-10-27 MED ORDER — HEPARIN SOD (PORK) LOCK FLUSH 100 UNIT/ML IV SOLN
500.0000 [IU] | Freq: Once | INTRAVENOUS | Status: AC
Start: 2023-10-27 — End: 2023-10-27
  Administered 2023-10-27: 500 [IU] via INTRAVENOUS

## 2023-10-27 MED ORDER — IMMUNE GLOBULIN (HUMAN) 20 GM/200ML IV SOLN
25.0000 g | Freq: Once | INTRAVENOUS | Status: AC
  Administered 2023-10-27: 25 g via INTRAVENOUS
  Filled 2023-10-27: qty 250

## 2023-10-27 MED ORDER — DARBEPOETIN ALFA 300 MCG/0.6ML IJ SOSY
300.0000 ug | PREFILLED_SYRINGE | Freq: Once | INTRAMUSCULAR | Status: AC
  Administered 2023-10-27: 300 ug via SUBCUTANEOUS
  Filled 2023-10-27: qty 0.6

## 2023-10-27 MED ORDER — SODIUM CHLORIDE 0.9% FLUSH
10.0000 mL | INTRAVENOUS | Status: DC | PRN
  Administered 2023-10-27: 10 mL via INTRAVENOUS

## 2023-10-27 NOTE — Patient Instructions (Signed)

## 2023-10-27 NOTE — Progress Notes (Signed)
 Hematology and Oncology Follow Up Visit  [name] [id number] 12/23/1950 73 y.o. 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  She looks quite good.  She feels okay.   She recently was up at [facility] seen her rheumatologist.  The rheumatologist seems to be quite pleased with how well she is doing.  I think we can certainly move her appointments for IVIG to every 2 months.  I think the rheumatologist once every 39-month appointments for the next 3 appointments and then every 3 months after that.  Her CK today was 74.  This is holding steady.  Her last CRP was 0.7.  This also is holding pretty steady.  Her aldolase was 4.0.  There is no problems with her iron.  Her hemoglobin is little bit lower today.  We will going give her a dose of Aranesp.  The scleroderma seems doing quite nicely.  At some point, we have to reevaluate her breast cancer.  She probably needs to have a MRI of the breast.    When we last saw her, her ferritin was 84 with an iron saturation of 33%.  Her last IgG level was 1241 mg/dL.  Medications:  Current Outpatient Medications:    acetaminophen (TYLENOL) 650 MG CR tablet, Take 650 mg by mouth daily as needed for pain., Disp: , Rfl:    diphenhydrAMINE (BENADRYL) 25 mg capsule, Take 25 mg by mouth every 6 (six) hours as needed for allergies. Allergies and before IVIG treatment., Disp: , Rfl:    ELDERBERRY PO, Take 1 oz by mouth 4 (four) times daily as needed. Liquid, Disp: , Rfl:    furosemide (LASIX) 20 MG tablet, Take 1 tablet (20 mg total) by mouth daily., Disp: 90 tablet, Rfl: 1   losartan (COZAAR) 25 MG tablet, Take 25 mg by mouth in the  morning and at bedtime., Disp: , Rfl:    Multiple Vitamin (MULTIVITAMIN) capsule, Take 1 capsule by mouth daily., Disp: , Rfl:    mycophenolate (CELLCEPT) 500 MG tablet, Take 1,500 mg by mouth 2 (two) times daily., Disp: , Rfl:    sodium chloride (OCEAN) 0.65 % SOLN nasal spray, Place 1 spray into both nostrils as needed for congestion., Disp: , Rfl:    SYNTHROID 100 MCG tablet, Take 1 tablet (100 mcg total) by mouth daily before breakfast., Disp: 90 tablet, Rfl: 3   Vonoprazan Fumarate 20 MG TABS, Take 20 mg by mouth daily., Disp: 90 tablet, Rfl: 0  Allergies:  Allergies  Allergen Reactions   Betadine [Povidone Iodine] Rash   Epinephrine Other (See Comments)    Severe headaches    Iodinated Contrast Media Other (See Comments) and Rash    "per pt: recently had CT 08/ 2018 even through given pre-medication, IVP still caused rash"  "per pt recently had CT 08/ 2018 even through given pre-medication, IVP still caused rash"   Iodine Rash and Other (See Comments)   Oxycodone Anxiety   Prednisone Anaphylaxis    Per Nephrology pt should avoid this medication because she has Scleroderma and it will interfere with her Kidneys,    Gabapentin Other (See Comments)    Dizziness   Lidocaine Rash   Nickel  Rash and Other (See Comments)    Rash and blisters   Other Other (See Comments) and Rash    Hospital gown   Penicillins Rash and Other (See Comments)    Rash and blisters Has patient had a PCN reaction causing immediate rash, facial/tongue/throat swelling, SOB or lightheadedness with hypotension: Yes Has patient had a PCN reaction causing severe rash involving mucus membranes or skin necrosis: No Has patient had a PCN reaction that required hospitalization: No Has patient had a PCN reaction occurring within the last 10 years: No If all of the above answers are "NO", then may proceed with Cephalosporin use.    Sulfa Antibiotics Rash    Past Medical History, Surgical history, Social history,  and Family History were reviewed and updated.  Review of Systems: Review of Systems  Constitutional:  Positive for fatigue.  HENT:  Negative.    Eyes: Negative.   Respiratory: Negative.    Cardiovascular: Negative.   Gastrointestinal: Negative.   Endocrine: Negative.   Genitourinary: Negative.    Musculoskeletal:  Positive for arthralgias and myalgias.  Skin:  Positive for rash.  Neurological: Negative.   Hematological: Negative.   Psychiatric/Behavioral: Negative.      Physical Exam: Temperature is 98.  Pulse 89.  Blood pressure 134/60.  Weight is 136 pounds.     Wt Readings from Last 3 Encounters:  10/27/23 136 lb (61.7 kg)  09/15/23 133 lb (60.3 kg)  08/18/23 132 lb (59.9 kg)    Physical Exam Vitals reviewed.  Constitutional:      Comments: Her breast exam shows right breast with no masses, edema or erythema.  There is no right axillary adenopathy.  Left breast shows the lumpectomy scar at about the 2 o'clock position.  There is little firmness at the lumpectomy site.  No distinct masses noted.  There is no left axillary adenopathy.  There is no left nipple discharge.  HENT:     Head: Normocephalic and atraumatic.  Eyes:     Pupils: Pupils are equal, round, and reactive to light.  Cardiovascular:     Rate and Rhythm: Normal rate and regular rhythm.     Heart sounds: Normal heart sounds.  Pulmonary:     Effort: Pulmonary effort is normal.     Breath sounds: Normal breath sounds.  Abdominal:     General: Bowel sounds are normal.     Palpations: Abdomen is soft.  Musculoskeletal:        General: No tenderness or deformity. Normal range of motion.     Cervical back: Normal range of motion.  Lymphadenopathy:     Cervical: No cervical adenopathy.  Skin:    General: Skin is warm and dry.     Findings: No erythema or rash.     Comments: Skin exam shows some changes of the scleroderma.  This is mostly on the face, around the lip area.  Her arms do show a little bit of  skin thickening.  Neurological:     Mental Status: She is alert and oriented to person, place, and time.  Psychiatric:        Behavior: Behavior normal.        Thought Content: Thought content normal.        Judgment: Judgment normal.      Lab Results  Component Value Date   WBC 6.9 10/27/2023   HGB 9.0 (L) 10/27/2023   HCT 27.8 (L) 10/27/2023   MCV 92.1 10/27/2023   PLT 274 10/27/2023  Chemistry      Component Value Date/Time   NA 136 10/27/2023 0920   NA 144 01/27/2020 1148   NA 141 06/09/2017 1117   K 3.4 (L) 10/27/2023 0920   K 3.9 06/09/2017 1117   CL 103 10/27/2023 0920   CL 109 (H) 12/14/2012 1253   CO2 24 10/27/2023 0920   CO2 23 06/09/2017 1117   BUN 16 10/27/2023 0920   BUN 11 01/27/2020 1148   BUN 11.5 06/09/2017 1117   CREATININE 1.02 (H) 10/27/2023 0920   CREATININE 1.2 (H) 06/09/2017 1117      Component Value Date/Time   CALCIUM 9.0 10/27/2023 0920   CALCIUM 8.9 06/09/2017 1117   ALKPHOS 64 10/27/2023 0920   ALKPHOS 40 06/09/2017 1117   AST 19 10/27/2023 0920   AST 35 (H) 06/09/2017 1117   ALT 9 10/27/2023 0920   ALT 19 06/09/2017 1117   BILITOT 0.4 10/27/2023 0920   BILITOT 0.37 06/09/2017 1117      Impression and Plan: [name] is a very charming 73 year old white female.  She has had a history of early stage breast cancer.  Again, we will go ahead and plan for follow-up MRI.  Of note, she was it was a tumor marker that has been elevated.  When we last checked it was 40.  Her biggest problem is  Scleroderma.  She is on IVIG.  This does seem to be helping her.  Her tumor markers that we are using- CK, aldolase, and C-reactive protein are all better.  We will plan to get her back in 2 months now.  We will do her IVIG every 2 months for the next 3 treatments and then go to every 3 months.  We will set up with the MRI.  I will plan to see her back myself in 2 months.  Ivor Mars, MD 4/14/202510:48 AM

## 2023-10-28 ENCOUNTER — Inpatient Hospital Stay

## 2023-10-28 VITALS — BP 120/58 | HR 75 | Temp 97.4°F | Resp 18

## 2023-10-28 DIAGNOSIS — Z923 Personal history of irradiation: Secondary | ICD-10-CM | POA: Diagnosis not present

## 2023-10-28 DIAGNOSIS — C50912 Malignant neoplasm of unspecified site of left female breast: Secondary | ICD-10-CM | POA: Diagnosis not present

## 2023-10-28 DIAGNOSIS — M3481 Systemic sclerosis with lung involvement: Secondary | ICD-10-CM | POA: Diagnosis not present

## 2023-10-28 DIAGNOSIS — I7300 Raynaud's syndrome without gangrene: Secondary | ICD-10-CM | POA: Diagnosis not present

## 2023-10-28 DIAGNOSIS — M349 Systemic sclerosis, unspecified: Secondary | ICD-10-CM | POA: Diagnosis not present

## 2023-10-28 DIAGNOSIS — D649 Anemia, unspecified: Secondary | ICD-10-CM | POA: Diagnosis not present

## 2023-10-28 DIAGNOSIS — Z170 Estrogen receptor positive status [ER+]: Secondary | ICD-10-CM | POA: Diagnosis not present

## 2023-10-28 DIAGNOSIS — D509 Iron deficiency anemia, unspecified: Secondary | ICD-10-CM

## 2023-10-28 LAB — IGG, IGA, IGM
IgA: 132 mg/dL (ref 64–422)
IgG (Immunoglobin G), Serum: 1215 mg/dL (ref 586–1602)
IgM (Immunoglobulin M), Srm: 52 mg/dL (ref 26–217)

## 2023-10-28 LAB — ALDOLASE: Aldolase: 4.2 U/L (ref 3.3–10.3)

## 2023-10-28 LAB — CANCER ANTIGEN 27.29: CA 27.29: 49.5 U/mL — ABNORMAL HIGH (ref 0.0–38.6)

## 2023-10-28 MED ORDER — SODIUM CHLORIDE 0.9% FLUSH
10.0000 mL | INTRAVENOUS | Status: DC | PRN
Start: 2023-10-28 — End: 2023-10-28
  Administered 2023-10-28: 10 mL via INTRAVENOUS

## 2023-10-28 MED ORDER — HEPARIN SOD (PORK) LOCK FLUSH 100 UNIT/ML IV SOLN
500.0000 [IU] | Freq: Once | INTRAVENOUS | Status: AC
  Administered 2023-10-28: 500 [IU] via INTRAVENOUS

## 2023-10-28 MED ORDER — DEXTROSE 5 % IV SOLN: INTRAVENOUS | Status: DC

## 2023-10-28 MED ORDER — ACETAMINOPHEN 325 MG PO TABS
650.0000 mg | ORAL_TABLET | Freq: Once | ORAL | Status: DC
Start: 2023-10-28 — End: 2023-10-28

## 2023-10-28 MED ORDER — DIPHENHYDRAMINE HCL 25 MG PO CAPS: 25.0000 mg | ORAL_CAPSULE | Freq: Once | ORAL | Status: DC

## 2023-10-28 MED ORDER — IMMUNE GLOBULIN (HUMAN) 20 GM/200ML IV SOLN
25.0000 g | Freq: Once | INTRAVENOUS | Status: AC
  Administered 2023-10-28: 25 g via INTRAVENOUS
  Filled 2023-10-28: qty 200

## 2023-10-28 NOTE — Patient Instructions (Signed)
 Immune Globulin Injection What is this medication? IMMUNE GLOBULIN (im MUNE GLOB yoo lin) treats many immune system conditions. It works by Designer, multimedia extra antibodies. Antibodies are proteins made by the immune system that help protect the body. This medicine may be used for other purposes; ask your health care provider or pharmacist if you have questions. COMMON BRAND NAME(S): ASCENIV, Baygam, BIVIGAM, Carimune, Carimune NF, cutaquig, Cuvitru, Flebogamma, Flebogamma DIF, GamaSTAN, GamaSTAN S/D, Gamimune N, Gammagard, Gammagard S/D, Gammaked, Gammaplex, Gammar-P IV, Gamunex, Gamunex-C, Hizentra, Iveegam, Iveegam EN, Octagam, Panglobulin, Panglobulin NF, panzyga, Polygam S/D, Privigen, Sandoglobulin, Venoglobulin-S, Vigam, Vivaglobulin, Xembify What should I tell my care team before I take this medication? They need to know if you have any of these conditions: Blood clotting disorder Condition where you have excess fluid in your body, such as heart failure or edema Dehydration Diabetes Have had blood clots Heart disease Immune system conditions Kidney disease Low levels of IgA Recent or upcoming vaccine An unusual or allergic reaction to immune globulin, other medications, foods, dyes, or preservatives Pregnant or trying to get pregnant Breastfeeding How should I use this medication? This medication is infused into a vein or under the skin. It is usually given by your care team in a hospital or clinic setting. It may also be given at home. If you get this medication at home, you will be taught how to prepare and give it. Use exactly as directed. Take it as directed on the prescription label at the same time every day. Keep taking it unless your care team tells you to stop. It is important that you put your used needles and syringes in a special sharps container. Do not put them in a trash can. If you do not have a sharps container, call your pharmacist or care team to get one. Talk to  your care team about the use of this medication in children. While it may be given to children for selected conditions, precautions do apply. Overdosage: If you think you have taken too much of this medicine contact a poison control center or emergency room at once. NOTE: This medicine is only for you. Do not share this medicine with others. What if I miss a dose? If you get this medication at the hospital or clinic: It is important not to miss your dose. Call your care team if you are unable to keep an appointment. If you give yourself this medication at home: If you miss a dose, take it as soon as you can. Then continue your normal schedule. If it is almost time for your next dose, take only that dose. Do not take double or extra doses. Call your care team with questions. What may interact with this medication? Live virus vaccines This list may not describe all possible interactions. Give your health care provider a list of all the medicines, herbs, non-prescription drugs, or dietary supplements you use. Also tell them if you smoke, drink alcohol, or use illegal drugs. Some items may interact with your medicine. What should I watch for while using this medication? Your condition will be monitored carefully while you are receiving this medication. Tell your care team if your symptoms do not start to get better or if they get worse. You may need blood work done while you are taking this medication. This medication increases the risk of blood clots. People with heart, blood vessel, or blood clotting conditions are more likely to develop a blood clot. Other risk factors include advanced age, estrogen  use, tobacco use, lack of movement, and being overweight. This medication can decrease the response to a vaccine. If you need to get vaccinated, tell your care team if you have received this medication within the last year. Extra booster doses may be needed. Talk to your care team to see if a different  vaccination schedule is needed. If you have diabetes, you may get a falsely elevated blood sugar reading. Talk to your care team about how to check your blood sugar while taking this medication. What side effects may I notice from receiving this medication? Side effects that you should report to your care team as soon as possible: Allergic reactions--skin rash, itching, hives, swelling of the face, lips, tongue, or throat Blood clot--pain, swelling, or warmth in the leg, shortness of breath, chest pain Fever, neck pain or stiffness, sensitivity to light, headache, nausea, vomiting, confusion, which may be signs of meningitis Hemolytic anemia--unusual weakness or fatigue, dizziness, headache, trouble breathing, dark urine, yellowing skin or eyes Kidney injury--decrease in the amount of urine, swelling of the ankles, hands, or feet Low sodium level--muscle weakness, fatigue, dizziness, headache, confusion Shortness of breath or trouble breathing, cough, unusual weakness or fatigue, blue skin or lips Side effects that usually do not require medical attention (report these to your care team if they continue or are bothersome): Chills Diarrhea Fever Headache Nausea This list may not describe all possible side effects. Call your doctor for medical advice about side effects. You may report side effects to FDA at 1-800-FDA-1088. Where should I keep my medication? Keep out of the reach of children and pets. You will be instructed on how to store this medication. Get rid of any unused medication after the expiration date. To get rid of medications that are no longer needed or have expired: Take the medication to a medication take-back program. Check with your pharmacy or law enforcement to find a location. If you cannot return the medication, ask your pharmacist or care team how to get rid of this medication safely. NOTE: This sheet is a summary. It may not cover all possible information. If you have  questions about this medicine, talk to your doctor, pharmacist, or health care provider.  2024 Elsevier/Gold Standard (2023-06-13 00:00:00)

## 2023-10-29 ENCOUNTER — Inpatient Hospital Stay

## 2023-10-29 ENCOUNTER — Inpatient Hospital Stay: Admitting: Licensed Clinical Social Worker

## 2023-10-29 VITALS — BP 118/60 | HR 82 | Temp 97.7°F | Resp 16

## 2023-10-29 DIAGNOSIS — M3481 Systemic sclerosis with lung involvement: Secondary | ICD-10-CM | POA: Diagnosis not present

## 2023-10-29 DIAGNOSIS — Z923 Personal history of irradiation: Secondary | ICD-10-CM | POA: Diagnosis not present

## 2023-10-29 DIAGNOSIS — C50912 Malignant neoplasm of unspecified site of left female breast: Secondary | ICD-10-CM | POA: Diagnosis not present

## 2023-10-29 DIAGNOSIS — Z170 Estrogen receptor positive status [ER+]: Secondary | ICD-10-CM | POA: Diagnosis not present

## 2023-10-29 DIAGNOSIS — M349 Systemic sclerosis, unspecified: Secondary | ICD-10-CM | POA: Diagnosis not present

## 2023-10-29 DIAGNOSIS — D649 Anemia, unspecified: Secondary | ICD-10-CM | POA: Diagnosis not present

## 2023-10-29 DIAGNOSIS — I7300 Raynaud's syndrome without gangrene: Secondary | ICD-10-CM | POA: Diagnosis not present

## 2023-10-29 DIAGNOSIS — D509 Iron deficiency anemia, unspecified: Secondary | ICD-10-CM

## 2023-10-29 MED ORDER — DIPHENHYDRAMINE HCL 25 MG PO CAPS: 25.0000 mg | ORAL_CAPSULE | Freq: Once | ORAL | Status: DC

## 2023-10-29 MED ORDER — SODIUM CHLORIDE 0.9% FLUSH
10.0000 mL | Freq: Once | INTRAVENOUS | Status: AC
  Administered 2023-10-29: 10 mL via INTRAVENOUS

## 2023-10-29 MED ORDER — IMMUNE GLOBULIN (HUMAN) 20 GM/200ML IV SOLN
25.0000 g | Freq: Once | INTRAVENOUS | Status: AC
  Administered 2023-10-29: 25 g via INTRAVENOUS
  Filled 2023-10-29: qty 200

## 2023-10-29 MED ORDER — HEPARIN SOD (PORK) LOCK FLUSH 100 UNIT/ML IV SOLN
500.0000 [IU] | Freq: Once | INTRAVENOUS | Status: AC
  Administered 2023-10-29: 500 [IU] via INTRAVENOUS

## 2023-10-29 MED ORDER — DEXTROSE 5 % IV SOLN: INTRAVENOUS | Status: DC

## 2023-10-29 MED ORDER — ACETAMINOPHEN 325 MG PO TABS: 650.0000 mg | ORAL_TABLET | Freq: Once | ORAL | Status: DC

## 2023-10-29 NOTE — Progress Notes (Signed)
 [facility] Clinical Social Work  Clinical Social Work was referred by Statistician for assessment of psychosocial needs.  Clinical Social Worker met with patient while she was receiving her treatment in infusion to offer support and assess for needs.    Patient stated she had all of her needs met.  She has an excellent support system.  She also stated she had minimal side effects and was feeling good.  CSW provided active listening and supportive counseling.   Kennth Peal, LCSW  Clinical Social Worker [facility]

## 2023-10-29 NOTE — Patient Instructions (Signed)
 Immune Globulin Injection What is this medication? IMMUNE GLOBULIN (im MUNE GLOB yoo lin) treats many immune system conditions. It works by Designer, multimedia extra antibodies. Antibodies are proteins made by the immune system that help protect the body. This medicine may be used for other purposes; ask your health care provider or pharmacist if you have questions. COMMON BRAND NAME(S): ASCENIV, Baygam, BIVIGAM, Carimune, Carimune NF, cutaquig, Cuvitru, Flebogamma, Flebogamma DIF, GamaSTAN, GamaSTAN S/D, Gamimune N, Gammagard, Gammagard S/D, Gammaked, Gammaplex, Gammar-P IV, Gamunex, Gamunex-C, Hizentra, Iveegam, Iveegam EN, Octagam, Panglobulin, Panglobulin NF, panzyga, Polygam S/D, Privigen, Sandoglobulin, Venoglobulin-S, Vigam, Vivaglobulin, Xembify What should I tell my care team before I take this medication? They need to know if you have any of these conditions: Blood clotting disorder Condition where you have excess fluid in your body, such as heart failure or edema Dehydration Diabetes Have had blood clots Heart disease Immune system conditions Kidney disease Low levels of IgA Recent or upcoming vaccine An unusual or allergic reaction to immune globulin, other medications, foods, dyes, or preservatives Pregnant or trying to get pregnant Breastfeeding How should I use this medication? This medication is infused into a vein or under the skin. It is usually given by your care team in a hospital or clinic setting. It may also be given at home. If you get this medication at home, you will be taught how to prepare and give it. Use exactly as directed. Take it as directed on the prescription label at the same time every day. Keep taking it unless your care team tells you to stop. It is important that you put your used needles and syringes in a special sharps container. Do not put them in a trash can. If you do not have a sharps container, call your pharmacist or care team to get one. Talk to  your care team about the use of this medication in children. While it may be given to children for selected conditions, precautions do apply. Overdosage: If you think you have taken too much of this medicine contact a poison control center or emergency room at once. NOTE: This medicine is only for you. Do not share this medicine with others. What if I miss a dose? If you get this medication at the hospital or clinic: It is important not to miss your dose. Call your care team if you are unable to keep an appointment. If you give yourself this medication at home: If you miss a dose, take it as soon as you can. Then continue your normal schedule. If it is almost time for your next dose, take only that dose. Do not take double or extra doses. Call your care team with questions. What may interact with this medication? Live virus vaccines This list may not describe all possible interactions. Give your health care provider a list of all the medicines, herbs, non-prescription drugs, or dietary supplements you use. Also tell them if you smoke, drink alcohol, or use illegal drugs. Some items may interact with your medicine. What should I watch for while using this medication? Your condition will be monitored carefully while you are receiving this medication. Tell your care team if your symptoms do not start to get better or if they get worse. You may need blood work done while you are taking this medication. This medication increases the risk of blood clots. People with heart, blood vessel, or blood clotting conditions are more likely to develop a blood clot. Other risk factors include advanced age, estrogen  use, tobacco use, lack of movement, and being overweight. This medication can decrease the response to a vaccine. If you need to get vaccinated, tell your care team if you have received this medication within the last year. Extra booster doses may be needed. Talk to your care team to see if a different  vaccination schedule is needed. If you have diabetes, you may get a falsely elevated blood sugar reading. Talk to your care team about how to check your blood sugar while taking this medication. What side effects may I notice from receiving this medication? Side effects that you should report to your care team as soon as possible: Allergic reactions--skin rash, itching, hives, swelling of the face, lips, tongue, or throat Blood clot--pain, swelling, or warmth in the leg, shortness of breath, chest pain Fever, neck pain or stiffness, sensitivity to light, headache, nausea, vomiting, confusion, which may be signs of meningitis Hemolytic anemia--unusual weakness or fatigue, dizziness, headache, trouble breathing, dark urine, yellowing skin or eyes Kidney injury--decrease in the amount of urine, swelling of the ankles, hands, or feet Low sodium level--muscle weakness, fatigue, dizziness, headache, confusion Shortness of breath or trouble breathing, cough, unusual weakness or fatigue, blue skin or lips Side effects that usually do not require medical attention (report these to your care team if they continue or are bothersome): Chills Diarrhea Fever Headache Nausea This list may not describe all possible side effects. Call your doctor for medical advice about side effects. You may report side effects to FDA at 1-800-FDA-1088. Where should I keep my medication? Keep out of the reach of children and pets. You will be instructed on how to store this medication. Get rid of any unused medication after the expiration date. To get rid of medications that are no longer needed or have expired: Take the medication to a medication take-back program. Check with your pharmacy or law enforcement to find a location. If you cannot return the medication, ask your pharmacist or care team how to get rid of this medication safely. NOTE: This sheet is a summary. It may not cover all possible information. If you have  questions about this medicine, talk to your doctor, pharmacist, or health care provider.  2024 Elsevier/Gold Standard (2023-06-13 00:00:00)

## 2023-10-30 ENCOUNTER — Inpatient Hospital Stay

## 2023-10-30 VITALS — BP 126/68 | HR 85 | Temp 97.7°F | Resp 18

## 2023-10-30 DIAGNOSIS — D509 Iron deficiency anemia, unspecified: Secondary | ICD-10-CM

## 2023-10-30 DIAGNOSIS — C50912 Malignant neoplasm of unspecified site of left female breast: Secondary | ICD-10-CM | POA: Diagnosis not present

## 2023-10-30 DIAGNOSIS — Z170 Estrogen receptor positive status [ER+]: Secondary | ICD-10-CM | POA: Diagnosis not present

## 2023-10-30 DIAGNOSIS — M349 Systemic sclerosis, unspecified: Secondary | ICD-10-CM | POA: Diagnosis not present

## 2023-10-30 DIAGNOSIS — I7300 Raynaud's syndrome without gangrene: Secondary | ICD-10-CM | POA: Diagnosis not present

## 2023-10-30 DIAGNOSIS — Z923 Personal history of irradiation: Secondary | ICD-10-CM | POA: Diagnosis not present

## 2023-10-30 DIAGNOSIS — M3481 Systemic sclerosis with lung involvement: Secondary | ICD-10-CM | POA: Diagnosis not present

## 2023-10-30 DIAGNOSIS — D649 Anemia, unspecified: Secondary | ICD-10-CM | POA: Diagnosis not present

## 2023-10-30 MED ORDER — SODIUM CHLORIDE 0.9% FLUSH
10.0000 mL | Freq: Once | INTRAVENOUS | Status: AC
  Administered 2023-10-30: 10 mL

## 2023-10-30 MED ORDER — IMMUNE GLOBULIN (HUMAN) 20 GM/200ML IV SOLN
25.0000 g | Freq: Once | INTRAVENOUS | Status: AC
  Administered 2023-10-30: 25 g via INTRAVENOUS
  Filled 2023-10-30: qty 200

## 2023-10-30 MED ORDER — HEPARIN SOD (PORK) LOCK FLUSH 100 UNIT/ML IV SOLN
500.0000 [IU] | Freq: Once | INTRAVENOUS | Status: AC
  Administered 2023-10-30: 500 [IU] via INTRAVENOUS

## 2023-10-30 MED ORDER — ACETAMINOPHEN 325 MG PO TABS: 650.0000 mg | ORAL_TABLET | Freq: Once | ORAL | Status: DC

## 2023-10-30 MED ORDER — DIPHENHYDRAMINE HCL 25 MG PO CAPS
25.0000 mg | ORAL_CAPSULE | Freq: Once | ORAL | Status: DC
Start: 2023-10-30 — End: 2023-10-30

## 2023-10-30 MED ORDER — DEXTROSE 5 % IV SOLN: INTRAVENOUS | Status: DC

## 2023-10-30 NOTE — Patient Instructions (Signed)
 Immune Globulin Injection What is this medication? IMMUNE GLOBULIN (im MUNE GLOB yoo lin) treats many immune system conditions. It works by Designer, multimedia extra antibodies. Antibodies are proteins made by the immune system that help protect the body. This medicine may be used for other purposes; ask your health care provider or pharmacist if you have questions. COMMON BRAND NAME(S): ASCENIV, Baygam, BIVIGAM, Carimune, Carimune NF, cutaquig, Cuvitru, Flebogamma, Flebogamma DIF, GamaSTAN, GamaSTAN S/D, Gamimune N, Gammagard, Gammagard S/D, Gammaked, Gammaplex, Gammar-P IV, Gamunex, Gamunex-C, Hizentra, Iveegam, Iveegam EN, Octagam, Panglobulin, Panglobulin NF, panzyga, Polygam S/D, Privigen, Sandoglobulin, Venoglobulin-S, Vigam, Vivaglobulin, Xembify What should I tell my care team before I take this medication? They need to know if you have any of these conditions: Blood clotting disorder Condition where you have excess fluid in your body, such as heart failure or edema Dehydration Diabetes Have had blood clots Heart disease Immune system conditions Kidney disease Low levels of IgA Recent or upcoming vaccine An unusual or allergic reaction to immune globulin, other medications, foods, dyes, or preservatives Pregnant or trying to get pregnant Breastfeeding How should I use this medication? This medication is infused into a vein or under the skin. It is usually given by your care team in a hospital or clinic setting. It may also be given at home. If you get this medication at home, you will be taught how to prepare and give it. Use exactly as directed. Take it as directed on the prescription label at the same time every day. Keep taking it unless your care team tells you to stop. It is important that you put your used needles and syringes in a special sharps container. Do not put them in a trash can. If you do not have a sharps container, call your pharmacist or care team to get one. Talk to  your care team about the use of this medication in children. While it may be given to children for selected conditions, precautions do apply. Overdosage: If you think you have taken too much of this medicine contact a poison control center or emergency room at once. NOTE: This medicine is only for you. Do not share this medicine with others. What if I miss a dose? If you get this medication at the hospital or clinic: It is important not to miss your dose. Call your care team if you are unable to keep an appointment. If you give yourself this medication at home: If you miss a dose, take it as soon as you can. Then continue your normal schedule. If it is almost time for your next dose, take only that dose. Do not take double or extra doses. Call your care team with questions. What may interact with this medication? Live virus vaccines This list may not describe all possible interactions. Give your health care provider a list of all the medicines, herbs, non-prescription drugs, or dietary supplements you use. Also tell them if you smoke, drink alcohol, or use illegal drugs. Some items may interact with your medicine. What should I watch for while using this medication? Your condition will be monitored carefully while you are receiving this medication. Tell your care team if your symptoms do not start to get better or if they get worse. You may need blood work done while you are taking this medication. This medication increases the risk of blood clots. People with heart, blood vessel, or blood clotting conditions are more likely to develop a blood clot. Other risk factors include advanced age, estrogen  use, tobacco use, lack of movement, and being overweight. This medication can decrease the response to a vaccine. If you need to get vaccinated, tell your care team if you have received this medication within the last year. Extra booster doses may be needed. Talk to your care team to see if a different  vaccination schedule is needed. If you have diabetes, you may get a falsely elevated blood sugar reading. Talk to your care team about how to check your blood sugar while taking this medication. What side effects may I notice from receiving this medication? Side effects that you should report to your care team as soon as possible: Allergic reactions--skin rash, itching, hives, swelling of the face, lips, tongue, or throat Blood clot--pain, swelling, or warmth in the leg, shortness of breath, chest pain Fever, neck pain or stiffness, sensitivity to light, headache, nausea, vomiting, confusion, which may be signs of meningitis Hemolytic anemia--unusual weakness or fatigue, dizziness, headache, trouble breathing, dark urine, yellowing skin or eyes Kidney injury--decrease in the amount of urine, swelling of the ankles, hands, or feet Low sodium level--muscle weakness, fatigue, dizziness, headache, confusion Shortness of breath or trouble breathing, cough, unusual weakness or fatigue, blue skin or lips Side effects that usually do not require medical attention (report these to your care team if they continue or are bothersome): Chills Diarrhea Fever Headache Nausea This list may not describe all possible side effects. Call your doctor for medical advice about side effects. You may report side effects to FDA at 1-800-FDA-1088. Where should I keep my medication? Keep out of the reach of children and pets. You will be instructed on how to store this medication. Get rid of any unused medication after the expiration date. To get rid of medications that are no longer needed or have expired: Take the medication to a medication take-back program. Check with your pharmacy or law enforcement to find a location. If you cannot return the medication, ask your pharmacist or care team how to get rid of this medication safely. NOTE: This sheet is a summary. It may not cover all possible information. If you have  questions about this medicine, talk to your doctor, pharmacist, or health care provider.  2024 Elsevier/Gold Standard (2023-06-13 00:00:00)

## 2023-10-30 NOTE — Progress Notes (Signed)
Patient does not want to stay for the recommended 30  minute post IVIG observation. VSS. Patient discharged ambulatory without questions or concerns.

## 2023-10-31 ENCOUNTER — Inpatient Hospital Stay

## 2023-10-31 VITALS — BP 120/65 | HR 78 | Temp 97.4°F | Resp 16

## 2023-10-31 DIAGNOSIS — C50912 Malignant neoplasm of unspecified site of left female breast: Secondary | ICD-10-CM | POA: Diagnosis not present

## 2023-10-31 DIAGNOSIS — M349 Systemic sclerosis, unspecified: Secondary | ICD-10-CM | POA: Diagnosis not present

## 2023-10-31 DIAGNOSIS — D649 Anemia, unspecified: Secondary | ICD-10-CM | POA: Diagnosis not present

## 2023-10-31 DIAGNOSIS — Z923 Personal history of irradiation: Secondary | ICD-10-CM | POA: Diagnosis not present

## 2023-10-31 DIAGNOSIS — Z170 Estrogen receptor positive status [ER+]: Secondary | ICD-10-CM | POA: Diagnosis not present

## 2023-10-31 DIAGNOSIS — D509 Iron deficiency anemia, unspecified: Secondary | ICD-10-CM

## 2023-10-31 DIAGNOSIS — M3481 Systemic sclerosis with lung involvement: Secondary | ICD-10-CM | POA: Diagnosis not present

## 2023-10-31 DIAGNOSIS — I7300 Raynaud's syndrome without gangrene: Secondary | ICD-10-CM | POA: Diagnosis not present

## 2023-10-31 MED ORDER — IMMUNE GLOBULIN (HUMAN) 20 GM/200ML IV SOLN
25.0000 g | Freq: Once | INTRAVENOUS | Status: AC
  Administered 2023-10-31: 25 g via INTRAVENOUS
  Filled 2023-10-31: qty 250

## 2023-10-31 NOTE — Patient Instructions (Signed)
 Immune Globulin Injection What is this medication? IMMUNE GLOBULIN (im MUNE GLOB yoo lin) treats many immune system conditions. It works by Designer, multimedia extra antibodies. Antibodies are proteins made by the immune system that help protect the body. This medicine may be used for other purposes; ask your health care provider or pharmacist if you have questions. COMMON BRAND NAME(S): ASCENIV, Baygam, BIVIGAM, Carimune, Carimune NF, cutaquig, Cuvitru, Flebogamma, Flebogamma DIF, GamaSTAN, GamaSTAN S/D, Gamimune N, Gammagard, Gammagard S/D, Gammaked, Gammaplex, Gammar-P IV, Gamunex, Gamunex-C, Hizentra, Iveegam, Iveegam EN, Octagam, Panglobulin, Panglobulin NF, panzyga, Polygam S/D, Privigen, Sandoglobulin, Venoglobulin-S, Vigam, Vivaglobulin, Xembify What should I tell my care team before I take this medication? They need to know if you have any of these conditions: Blood clotting disorder Condition where you have excess fluid in your body, such as heart failure or edema Dehydration Diabetes Have had blood clots Heart disease Immune system conditions Kidney disease Low levels of IgA Recent or upcoming vaccine An unusual or allergic reaction to immune globulin, other medications, foods, dyes, or preservatives Pregnant or trying to get pregnant Breastfeeding How should I use this medication? This medication is infused into a vein or under the skin. It is usually given by your care team in a hospital or clinic setting. It may also be given at home. If you get this medication at home, you will be taught how to prepare and give it. Use exactly as directed. Take it as directed on the prescription label at the same time every day. Keep taking it unless your care team tells you to stop. It is important that you put your used needles and syringes in a special sharps container. Do not put them in a trash can. If you do not have a sharps container, call your pharmacist or care team to get one. Talk to  your care team about the use of this medication in children. While it may be given to children for selected conditions, precautions do apply. Overdosage: If you think you have taken too much of this medicine contact a poison control center or emergency room at once. NOTE: This medicine is only for you. Do not share this medicine with others. What if I miss a dose? If you get this medication at the hospital or clinic: It is important not to miss your dose. Call your care team if you are unable to keep an appointment. If you give yourself this medication at home: If you miss a dose, take it as soon as you can. Then continue your normal schedule. If it is almost time for your next dose, take only that dose. Do not take double or extra doses. Call your care team with questions. What may interact with this medication? Live virus vaccines This list may not describe all possible interactions. Give your health care provider a list of all the medicines, herbs, non-prescription drugs, or dietary supplements you use. Also tell them if you smoke, drink alcohol, or use illegal drugs. Some items may interact with your medicine. What should I watch for while using this medication? Your condition will be monitored carefully while you are receiving this medication. Tell your care team if your symptoms do not start to get better or if they get worse. You may need blood work done while you are taking this medication. This medication increases the risk of blood clots. People with heart, blood vessel, or blood clotting conditions are more likely to develop a blood clot. Other risk factors include advanced age, estrogen  use, tobacco use, lack of movement, and being overweight. This medication can decrease the response to a vaccine. If you need to get vaccinated, tell your care team if you have received this medication within the last year. Extra booster doses may be needed. Talk to your care team to see if a different  vaccination schedule is needed. If you have diabetes, you may get a falsely elevated blood sugar reading. Talk to your care team about how to check your blood sugar while taking this medication. What side effects may I notice from receiving this medication? Side effects that you should report to your care team as soon as possible: Allergic reactions--skin rash, itching, hives, swelling of the face, lips, tongue, or throat Blood clot--pain, swelling, or warmth in the leg, shortness of breath, chest pain Fever, neck pain or stiffness, sensitivity to light, headache, nausea, vomiting, confusion, which may be signs of meningitis Hemolytic anemia--unusual weakness or fatigue, dizziness, headache, trouble breathing, dark urine, yellowing skin or eyes Kidney injury--decrease in the amount of urine, swelling of the ankles, hands, or feet Low sodium level--muscle weakness, fatigue, dizziness, headache, confusion Shortness of breath or trouble breathing, cough, unusual weakness or fatigue, blue skin or lips Side effects that usually do not require medical attention (report these to your care team if they continue or are bothersome): Chills Diarrhea Fever Headache Nausea This list may not describe all possible side effects. Call your doctor for medical advice about side effects. You may report side effects to FDA at 1-800-FDA-1088. Where should I keep my medication? Keep out of the reach of children and pets. You will be instructed on how to store this medication. Get rid of any unused medication after the expiration date. To get rid of medications that are no longer needed or have expired: Take the medication to a medication take-back program. Check with your pharmacy or law enforcement to find a location. If you cannot return the medication, ask your pharmacist or care team how to get rid of this medication safely. NOTE: This sheet is a summary. It may not cover all possible information. If you have  questions about this medicine, talk to your doctor, pharmacist, or health care provider.  2024 Elsevier/Gold Standard (2023-06-13 00:00:00)

## 2023-11-15 ENCOUNTER — Encounter: Payer: Self-pay | Admitting: Hematology & Oncology

## 2023-11-17 DIAGNOSIS — H2513 Age-related nuclear cataract, bilateral: Secondary | ICD-10-CM | POA: Diagnosis not present

## 2023-11-17 DIAGNOSIS — H53143 Visual discomfort, bilateral: Secondary | ICD-10-CM | POA: Diagnosis not present

## 2023-11-25 ENCOUNTER — Ambulatory Visit (INDEPENDENT_AMBULATORY_CARE_PROVIDER_SITE_OTHER)

## 2023-11-25 VITALS — Ht 64.0 in | Wt 129.0 lb

## 2023-11-25 DIAGNOSIS — Z0000 Encounter for general adult medical examination without abnormal findings: Secondary | ICD-10-CM

## 2023-11-25 NOTE — Progress Notes (Signed)
 Subjective:   [name] is a 73 y.o. who presents for a Medicare Wellness preventive visit.  As a reminder, Annual Wellness Visits don't include a physical exam, and some assessments may be limited, especially if this visit is performed virtually. We may recommend an in-person visit if needed.  Visit Complete: Virtual I connected with  [name] on 11/25/23 by a audio enabled telemedicine application and verified that I am speaking with the correct person using two identifiers.  Patient Location: Home  Provider Location: Home Office  I discussed the limitations of evaluation and management by telemedicine. The patient expressed understanding and agreed to proceed.  Vital Signs: Because this visit was a virtual/telehealth visit, some criteria may be missing or patient reported. Any vitals not documented were not able to be obtained and vitals that have been documented are patient reported.    Persons Participating in Visit: Patient.  AWV Questionnaire: No: Patient Medicare AWV questionnaire was not completed prior to this visit.  Cardiac Risk Factors include: advanced age (>26men, >22 women);hypertension     Objective:     Today's Vitals   11/25/23 1358  Weight: 129 lb (58.5 kg)  Height: 5\' 4"  (1.626 m)   Body mass index is 22.14 kg/m.     11/25/2023    2:03 PM 10/30/2023    9:39 AM 10/27/2023    9:43 AM 10/27/2023    9:34 AM 09/15/2023    9:14 AM 08/05/2023    9:07 AM 08/04/2023    9:35 AM  Advanced Directives  Does Patient Have a Medical Advance Directive? Yes Yes Yes Yes Yes Yes Yes  Type of Estate agent of [name];Living will Healthcare Power of [name];Living will Living will Living will Healthcare Power of [place];Living will Living will Living will  Does patient want to make changes to medical advance directive? No - Patient declined No - Patient declined No - Patient declined No - Patient declined No - Patient declined No -  Patient declined No - Patient declined  Copy of Healthcare Power of Attorney in Chart? Yes - validated most recent copy scanned in chart (See row information)     No - copy requested     Current Medications (verified) Outpatient Encounter Medications as of 11/25/2023  Medication Sig   acetaminophen  (TYLENOL ) 650 MG CR tablet Take 650 mg by mouth daily as needed for pain.   diphenhydrAMINE  (BENADRYL ) 25 mg capsule Take 25 mg by mouth every 6 (six) hours as needed for allergies. Allergies and before IVIG treatment.   ELDERBERRY PO Take 1 oz by mouth 4 (four) times daily as needed. Liquid   furosemide  (LASIX ) 20 MG tablet Take 1 tablet (20 mg total) by mouth daily.   losartan  (COZAAR ) 25 MG tablet Take 25 mg by mouth in the morning and at bedtime.   Multiple Vitamin (MULTIVITAMIN) capsule Take 1 capsule by mouth daily.   mycophenolate (CELLCEPT) 500 MG tablet Take 1,500 mg by mouth 2 (two) times daily.   sodium chloride  (OCEAN) 0.65 % SOLN nasal spray Place 1 spray into both nostrils as needed for congestion.   SYNTHROID  100 MCG tablet Take 1 tablet (100 mcg total) by mouth daily before breakfast.   Vonoprazan Fumarate  20 MG TABS Take 20 mg by mouth daily.   No facility-administered encounter medications on file as of 11/25/2023.    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, ER/PR+, HER2 negative -- 11-04-2011  left partial mastectomy w/ sln dissection-- completed radiation 01-08-2012-- started tamoxifen  07/ 2013-- per lov note 11/ 2018 no recurrence   Chronic renal failure (CRF), stage 3a (HCC) 11/18/2022   Colon polyp     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   Depression    Esophageal stricture    GERD (gastroesophageal reflux disease)    Hiatal hernia    History of external beam radiation therapy 11-25-2011 to 01-08-2012   left breast 45 Gy at 1.8 per fraction x25 fractions, boost 16 Gy at 2 per fraction x8 fractions   Hyperlipidemia    Hypertension    Hypothyroidism    Internal hemorrhoids    Osteoarthritis    Osteopenia    Positive ANA (antinuclear antibody)    Rash    arms and legs caused by skin scleroderma   Raynaud's syndrome    Scleroderma involving lung (HCC) pulmologist-  [name] at [name]   systemic sclerosis , diffuse/   rheumotologist:  [name] at [name]---  lov 07-22-2017 (as of 07-25-2017 note not available to read)---  per pt has appt. w/ specialist at [name]   Skin thickening    hands, arms, legs  caused by scleroderma   Systemic sclerosis (HCC)    UTI (urinary tract infection)    Past Surgical History:  Procedure Laterality Date   BIOPSY  01/29/2018   Procedure: BIOPSY;  Surgeon: Jennifer Babe, MD;  Location: [facility];  Service: Gastroenterology;;   [name]  2000   Left   COLONOSCOPY WITH PROPOFOL  N/A 01/29/2018   Procedure: COLONOSCOPY WITH PROPOFOL ;  Surgeon: Jennifer Babe, MD;  Location: [facility];  Service: Gastroenterology;  Laterality: N/A;   DILATATION & CURETTAGE/HYSTEROSCOPY WITH MYOSURE N/A 08/04/2017   Procedure: DILATATION & CURETTAGE/HYSTEROSCOPY WITH MYOSURE;  Surgeon: Jennifer Rein, MD;  Location: [name];  Service: Gynecology;  Laterality: N/A;   ECTOPIC PREGNANCY SURGERY  1986-88   s/p unilateral salpingectomy   ESOPHAGOGASTRODUODENOSCOPY (EGD) WITH PROPOFOL  N/A 01/29/2018   Procedure: ESOPHAGOGASTRODUODENOSCOPY (EGD) WITH PROPOFOL ;  Surgeon: Jennifer Babe, MD;  Location: [facility];  Service: Gastroenterology;  Laterality: N/A;   IR IMAGING GUIDED PORT INSERTION  12/02/2022   PARTIAL MASTECTOMY WITH  AXILLARY SENTINEL LYMPH NODE BIOPSY Left 11-04-2011   [name]  [facility]   RIGHT HEART CATH N/A 10/01/2022   Procedure: RIGHT HEART CATH;  Surgeon: Mardell Shade, MD;  Location: [name];  Service: Cardiovascular;  Laterality: N/A;   TRANSTHORACIC ECHOCARDIOGRAM  06-04-2017    [facility]   moderate LVH, ef>55%/  trivial AR and TR/ mild MR and PR/  trivial pericardial effusion   WISDOM TOOTH EXTRACTION     Family History  Problem Relation Age of Onset   Heart disease Mother    Heart failure Mother    Heart disease Father    Ovarian cancer Maternal Aunt    Heart disease Paternal Uncle        multiple   Autoimmune disease Sister    Arrhythmia Sister    Anemia Sister    CAD Brother    Colon cancer Neg Hx    Stomach cancer Neg Hx  Esophageal cancer Neg Hx    Rectal cancer Neg Hx    Social History   Socioeconomic History   Marital status: Married    Spouse name: Not on file   Number of children: 1   Years of education: 16   Highest education level: Not on file  Occupational History   Occupation: self employed  Tobacco Use   Smoking status: Never   Smokeless tobacco: Never  Vaping Use   Vaping status: Never Used  Substance and Sexual Activity   Alcohol  use: Yes    Comment: socially   Drug use: No   Sexual activity: Yes    Birth control/protection: Post-menopausal  Other Topics Concern   Not on file  Social History Narrative   Lives with husband in a 2 story home.  Has 1 daughter.     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     Education: college.    Social Drivers of Corporate investment banker Strain: Low Risk  (11/25/2023)   Overall Financial Resource Strain (CARDIA)    Difficulty of Paying Living Expenses: Not hard at all  Food Insecurity: No Food Insecurity (11/25/2023)   Hunger Vital Sign    Worried About Running Out of Food in the Last Year: Never true    Ran Out of Food in the Last Year: Never true  Transportation Needs: No Transportation Needs  (11/25/2023)   PRAPARE - Administrator, Civil Service (Medical): No    Lack of Transportation (Non-Medical): No  Physical Activity: Insufficiently Active (11/25/2023)   Exercise Vital Sign    Days of Exercise per Week: 2 days    Minutes of Exercise per Session: 30 min  Stress: No Stress Concern Present (11/25/2023)   [employer] of Occupational Health - Occupational Stress Questionnaire    Feeling of Stress : Not at all  Social Connections: Socially Integrated (11/25/2023)   Social Connection and Isolation Panel [NHANES]    Frequency of Communication with Friends and Family: More than three times a week    Frequency of Social Gatherings with Friends and Family: More than three times a week    Attends Religious Services: More than 4 times per year    Active Member of [employer] or Organizations: Yes    Attends Engineer, structural: More than 4 times per year    Marital Status: Married    Tobacco Counseling Counseling given: Not Answered    Clinical Intake:  Pre-visit preparation completed: Yes  Pain : No/denies pain     BMI - recorded: 22.14 Nutritional Status: BMI of 19-24  Normal Nutritional Risks: None Diabetes: No  No results found for: "HGBA1C"   How often do you need to have someone help you when you read instructions, pamphlets, or other written materials from your doctor or pharmacy?: 1 - Never  Interpreter Needed?: No  Information entered by :: Farris Hong LPN   Activities of Daily Living      11/25/2023    2:03 PM 12/02/2022    1:03 PM  In your present state of health, do you have any difficulty performing the following activities:  Hearing? 0 0  Vision? 0 0  Difficulty concentrating or making decisions? 0 0  Walking or climbing stairs? 0 0  Dressing or bathing? 0 0  Doing errands, shopping? 0   Preparing Food and eating ? N   Using the Toilet? N   In the past six months, have you accidently leaked urine? N  Do you have  problems with loss of bowel control? N   Managing your Medications? N   Managing your Finances? N   Housekeeping or managing your Housekeeping? N     Patient Care Team: Copland, Skipper Dumas, MD as PCP - General (Family Medicine) Patel, Donika K, DO as Consulting Physician (Neurology) Kristian Petty, MD Elinore Guarneri, MD as Referring Physician (Rheumatology) Carmina Chris, MD as Referring Physician (Pulmonary Disease) Jennifer Rein, MD as Consulting Physician (Gynecology) Sheilla Der, MD as Referring Physician (Internal Medicine) Harlen Lick, MD as Consulting Physician (Dermatology) Jennifer Rein, MD as Consulting Physician (Gynecology) Pyrtle, Amber Bail, MD as Consulting Physician (Gastroenterology) Maria Shiner Sherryll Donald, MD as Medical Oncologist (Oncology)  Indicate any recent Medical Services you may have received from other than [facility] providers in the past year (date may be approximate).     Assessment:    This is a routine wellness examination for [place].  Hearing/Vision screen Hearing Screening - Comments:: Denies hearing difficulties   Vision Screening - Comments:: Wears rx glasses - up to date with routine eye exams with  [facility]   Goals Addressed               This Visit's Progress     Remain Active (pt-stated)         Depression Screen      11/25/2023    2:02 PM 11/14/2022    2:30 PM 03/21/2022    1:43 PM 02/02/2021    9:33 AM  PHQ 2/9 Scores  PHQ - 2 Score 0 0 0 0    Fall Risk      11/25/2023    2:03 PM 11/14/2022    2:26 PM 03/21/2022    1:43 PM 02/02/2021    9:33 AM 12/16/2016    8:33 AM  Fall Risk   Falls in the past year? 0 0 0 0 No  Number falls in past yr: 0 0 0 0   Injury with Fall? 0 0 0 0   Risk for fall due to : No Fall Risks No Fall Risks     Follow up Falls prevention discussed;Falls evaluation completed Education provided Falls evaluation completed      MEDICARE RISK AT HOME:   Medicare Risk at Home Any stairs in or  around the home?: No If so, are there any without handrails?: Yes Home free of loose throw rugs in walkways, pet beds, electrical cords, etc?: Yes Adequate lighting in your home to reduce risk of falls?: Yes Life alert?: No Use of a cane, walker or w/c?: No Grab bars in the bathroom?: Yes Shower chair or bench in shower?: Yes Elevated toilet seat or a handicapped toilet?: No  TIMED UP AND GO:  Was the test performed?  No  Cognitive Function: 6CIT completed        11/25/2023    2:04 PM 11/14/2022    2:35 PM  6CIT Screen  What Year? 0 points 0 points  What month? 0 points 0 points  What time? 0 points 0 points  Count back from 20 0 points 2 points  Months in reverse 0 points 0 points  Repeat phrase 0 points 0 points  Total Score 0 points 2 points    Immunizations Immunization History  Administered Date(s) Administered   Tdap 04/12/2008, 12/28/2018   Zoster, Live 08/16/2011    Screening Tests Health Maintenance  Topic Date Due   COVID-19 Vaccine (1) Never done   Hepatitis C  Screening  Never done   Pneumonia Vaccine 75+ Years old (1 of 2 - PCV) Never done   Zoster Vaccines- Shingrix (1 of 2) 04/22/1968   INFLUENZA VACCINE  05/09/2025 (Originally 02/13/2024)   Medicare Annual Wellness (AWV)  11/24/2024   Colonoscopy  01/30/2028   DTaP/Tdap/Td (3 - Td or Tdap) 12/27/2028   DEXA SCAN  Completed   HPV VACCINES  Aged Out   Meningococcal B Vaccine  Aged Out   MAMMOGRAM  Discontinued    Health Maintenance  Health Maintenance Due  Topic Date Due   COVID-19 Vaccine (1) Never done   Hepatitis C Screening  Never done   Pneumonia Vaccine 72+ Years old (1 of 2 - PCV) Never done   Zoster Vaccines- Shingrix (1 of 2) 04/22/1968   Health Maintenance Items Addressed: Hep-C screening deferred. Patient declined vaccines.  Additional Screening:  Vision Screening: Recommended annual ophthalmology exams for early detection of glaucoma and other disorders of the eye.  Dental  Screening: Recommended annual dental exams for proper oral hygiene  Community Resource Referral / Chronic Care Management: CRR required this visit?  No   CCM required this visit?  No   Plan:    I have personally reviewed and noted the following in the patient's chart:   Medical and social history Use of alcohol , tobacco or illicit drugs  Current medications and supplements including opioid prescriptions. Patient is not currently taking opioid prescriptions. Functional ability and status Nutritional status Physical activity Advanced directives List of other physicians Hospitalizations, surgeries, and ER visits in previous 12 months Vitals Screenings to include cognitive, depression, and falls Referrals and appointments  In addition, I have reviewed and discussed with patient certain preventive protocols, quality metrics, and best practice recommendations. A written personalized care plan for preventive services as well as general preventive health recommendations were provided to patient.   Dewayne Ford, LPN   1/61/0960   After Visit Summary: (MyChart) Due to this being a telephonic visit, the after visit summary with patients personalized plan was offered to patient via MyChart   Notes: Nothing significant to report at this time.

## 2023-11-25 NOTE — Patient Instructions (Addendum)
 Ms. Tomek , Thank you for taking time out of your busy schedule to complete your Annual Wellness Visit with me. I enjoyed our conversation and look forward to speaking with you again next year. I, as well as your care team,  appreciate your ongoing commitment to your health goals. Please review the following plan we discussed and let me know if I can assist you in the future. Your Game plan/ To Do List    Referrals: If you haven't heard from the office you've been referred to, please reach out to them at the phone provided.   Follow up Visits: Next Medicare AWV with our clinical staff: 12/02/24  @ 1:50p Have you seen your provider in the last 6 months (3 months if uncontrolled diabetes)? Yes 03/08/24 Next Office Visit with your provider:   Clinician Recommendations:  Aim for 30 minutes of exercise or brisk walking, 6-8 glasses of water, and 5 servings of fruits and vegetables each day.       This is a list of the screening recommended for you and due dates:  Health Maintenance  Topic Date Due   COVID-19 Vaccine (1) Never done   Hepatitis C Screening  Never done   Pneumonia Vaccine (1 of 2 - PCV) Never done   Zoster (Shingles) Vaccine (1 of 2) 04/22/1968   Flu Shot  05/09/2025*   Medicare Annual Wellness Visit  11/24/2024   Colon Cancer Screening  01/30/2028   DTaP/Tdap/Td vaccine (3 - Td or Tdap) 12/27/2028   DEXA scan (bone density measurement)  Completed   HPV Vaccine  Aged Out   Meningitis B Vaccine  Aged Out   Mammogram  Discontinued  *Topic was postponed. The date shown is not the original due date.    Advanced directives: (In Chart) A copy of your advanced directives are scanned into your chart should your provider ever need it. Advance Care Planning is important because it:  [x]  Makes sure you receive the medical care that is consistent with your values, goals, and preferences  [x]  It provides guidance to your family and loved ones and reduces their decisional burden  about whether or not they are making the right decisions based on your wishes.  Follow the link provided in your after visit summary or read over the paperwork we have mailed to you to help you started getting your Advance Directives in place. If you need assistance in completing these, please reach out to us  so that we can help you!  See attachments for Preventive Care and Fall Prevention Tips.

## 2023-11-27 ENCOUNTER — Other Ambulatory Visit: Payer: Self-pay | Admitting: Family Medicine

## 2023-11-28 ENCOUNTER — Ambulatory Visit (HOSPITAL_COMMUNITY)
Admission: RE | Admit: 2023-11-28 | Discharge: 2023-11-28 | Disposition: A | Discharge status: Home / Self Care | Source: Ambulatory Visit | Attending: Hematology & Oncology | Admitting: Hematology & Oncology

## 2023-11-28 ENCOUNTER — Encounter: Payer: Self-pay | Admitting: *Deleted

## 2023-11-28 ENCOUNTER — Ambulatory Visit: Payer: Self-pay | Admitting: Hematology & Oncology

## 2023-11-28 DIAGNOSIS — Z170 Estrogen receptor positive status [ER+]: Secondary | ICD-10-CM | POA: Insufficient documentation

## 2023-11-28 DIAGNOSIS — R92333 Mammographic heterogeneous density, bilateral breasts: Secondary | ICD-10-CM | POA: Diagnosis not present

## 2023-11-28 DIAGNOSIS — C50412 Malignant neoplasm of upper-outer quadrant of left female breast: Secondary | ICD-10-CM | POA: Diagnosis not present

## 2023-11-28 DIAGNOSIS — Z853 Personal history of malignant neoplasm of breast: Secondary | ICD-10-CM | POA: Diagnosis not present

## 2023-11-28 MED ORDER — GADOBUTROL 1 MMOL/ML IV SOLN
6.0000 mL | Freq: Once | INTRAVENOUS | Status: AC | PRN
  Administered 2023-11-28: 6 mL via INTRAVENOUS

## 2023-11-29 ENCOUNTER — Encounter: Payer: Self-pay | Admitting: Family Medicine

## 2023-11-29 MED ORDER — FUROSEMIDE 20 MG PO TABS: 20.0000 mg | ORAL_TABLET | Freq: Every day | ORAL | 0 refills | Status: DC

## 2023-12-03 ENCOUNTER — Ambulatory Visit (INDEPENDENT_AMBULATORY_CARE_PROVIDER_SITE_OTHER): Admitting: Internal Medicine

## 2023-12-03 ENCOUNTER — Encounter: Payer: Self-pay | Admitting: Internal Medicine

## 2023-12-03 VITALS — BP 100/66 | HR 94 | Ht 64.0 in | Wt 136.8 lb

## 2023-12-03 DIAGNOSIS — K2100 Gastro-esophageal reflux disease with esophagitis, without bleeding: Secondary | ICD-10-CM

## 2023-12-03 DIAGNOSIS — K449 Diaphragmatic hernia without obstruction or gangrene: Secondary | ICD-10-CM

## 2023-12-03 DIAGNOSIS — K224 Dyskinesia of esophagus: Secondary | ICD-10-CM

## 2023-12-03 DIAGNOSIS — Z860101 Personal history of adenomatous and serrated colon polyps: Secondary | ICD-10-CM

## 2023-12-03 DIAGNOSIS — K222 Esophageal obstruction: Secondary | ICD-10-CM

## 2023-12-03 DIAGNOSIS — M349 Systemic sclerosis, unspecified: Secondary | ICD-10-CM | POA: Diagnosis not present

## 2023-12-03 DIAGNOSIS — R059 Cough, unspecified: Secondary | ICD-10-CM

## 2023-12-03 DIAGNOSIS — J3489 Other specified disorders of nose and nasal sinuses: Secondary | ICD-10-CM

## 2023-12-03 DIAGNOSIS — R053 Chronic cough: Secondary | ICD-10-CM

## 2023-12-03 MED ORDER — VOQUEZNA 10 MG PO TABS: 1.0000 | ORAL_TABLET | Freq: Every day | ORAL | 1 refills | Status: DC

## 2023-12-03 NOTE — Progress Notes (Signed)
 Subjective:    Patient ID: [name], female    DOB: 01-18-1951, 73 y.o.   MRN: [id number]  HPI [name] is a 73 year old female with a history of scleroderma with systemic sclerosis, GERD with reflux esophagitis and hiatal hernia, esophageal stricture, history of nonadvanced adenomatous polyps, prior breast cancer, hypothyroidism, hyperlipidemia who is here for follow-up.  She is here today with her husband.  She has a history of scleroderma with systemic sclerosis and known esophageal dysmotility. An EGD on August 18, 2023, showed LA grade C esophagitis and a benign moderate stenosis 37 cm from the incisors, which was dilated to 18 mm with moderate mucosal disruption. A 2 cm hiatal hernia and a Hill grade IV gastroesophageal flap were also noted. The stomach and duodenum appeared normal. Her medication was changed from pantoprazole  40 mg BID to vonoprazan 20 mg daily.  Swallowing is not currently an issue, but she experiences gurgling and hiccup-like sensations without acid reflux. There is a sensation of fullness when bending over or sitting, particularly after meals. She also has a persistent cough that worsens with esophageal irritation, such as swallowing incorrectly. The cough is described as 'uncontrollable' and is alleviated by applying warmth around her neck, a technique suggested by her physical therapist.  The cough does not typically occur with liquids, but she experiences anticipatory coughing before eating, particularly with dry foods like sandwiches. This has impacted her ability to eat comfortably in public settings, leading her to rely on nutritional supplements like Orgain for additional protein. Despite these challenges, her weight remains stable.  She experiences fatigue, which she attributes to inadequate nutrition, and reports that her energy levels are better when she exercises with her therapist. She also reports recent episodes of epistaxis and nasal  scabbing, which she has not experienced before.  She has been using Vaseline to manage nasal dryness.  Her bowel movements have been variable, ranging from soft to occasionally runny, but she maintains control. She associates these changes with her IVIG treatments, which have been more challenging recently, causing fatigue and malaise. She is currently on a seven-week interval for IVIG.  Review of Systems As per HPI, otherwise negative  Current Medications, Allergies, Past Medical History, Past Surgical History, Family History and Social History were reviewed in [employer] record.    Objective:   Physical Exam BP 100/66   Pulse 94   Ht 5\' 4"  (1.626 m)   Wt 136 lb 12.8 oz (62.1 kg)   BMI 23.48 kg/m  Gen: awake, alert, NAD HEENT: anicteric  Neuro: nonfocal  DIAGNOSTIC EGD: LA grade C esophagitis, benign appearing moderate stenosis 37 cm from the incisors, dilated to 18 mm with moderate mucosal disruption, 2 cm hiatal hernia, Hill grade IV gastroesophageal flap, normal stomach and duodenum (08/18/2023) Colonoscopy: Normal to the terminal ileum, small internal hemorrhoids (01/29/2018) Colonoscopy: Subcentimeter polyp removed from the splenic flexure (07/22/2014)  CMP     Component Value Date/Time   NA 136 10/27/2023 0920   NA 144 01/27/2020 1148   NA 141 06/09/2017 1117   K 3.4 (L) 10/27/2023 0920   K 3.9 06/09/2017 1117   CL 103 10/27/2023 0920   CL 109 (H) 12/14/2012 1253   CO2 24 10/27/2023 0920   CO2 23 06/09/2017 1117   GLUCOSE 109 (H) 10/27/2023 0920   GLUCOSE 101 06/09/2017 1117   GLUCOSE 94 12/14/2012 1253   BUN 16 10/27/2023 0920   BUN 11 01/27/2020 1148   BUN 11.5 06/09/2017  1117   CREATININE 1.02 (H) 10/27/2023 0920   CREATININE 1.2 (H) 06/09/2017 1117   CALCIUM  9.0 10/27/2023 0920   CALCIUM  8.9 06/09/2017 1117   PROT 6.6 10/27/2023 0920   PROT 7.0 01/27/2020 1148   PROT 5.8 (L) 06/09/2017 1117   ALBUMIN  3.9 10/27/2023 0920   ALBUMIN   4.6 01/27/2020 1148   ALBUMIN  3.2 (L) 06/09/2017 1117   AST 19 10/27/2023 0920   AST 35 (H) 06/09/2017 1117   ALT 9 10/27/2023 0920   ALT 19 06/09/2017 1117   ALKPHOS 64 10/27/2023 0920   ALKPHOS 40 06/09/2017 1117   BILITOT 0.4 10/27/2023 0920   BILITOT 0.37 06/09/2017 1117   GFR 37.97 (L) 10/28/2022 0958   EGFR 46 (L) 06/09/2017 1117   GFRNONAA 58 (L) 10/27/2023 0920       Assessment & Plan:  73 year old female with a history of scleroderma with systemic sclerosis, GERD with reflux esophagitis and hiatal hernia, esophageal stricture, history of nonadvanced adenomatous polyps, prior breast cancer, hypothyroidism, hyperlipidemia who is here for follow-up.    GERD with esophagitis Severe esophagitis with LA grade C on EGD. Symptoms include gurgling and hiccup-like sensations, possibly due to medication, but  more likely reflux itself. Discussed potential healing of esophagitis and increased sensitivity to acid.  - Reduce vonoprazan to 10 mg daily. - Monitor for changes in swallowing, cough, and gurgling symptoms with reduction in vonoprazan dosing - Retain remaining 20 mg vonoprazan in case of need to revert. - Low threshold to repeat EGD to assess healing  Esophageal dysmotility Dysmotility associated with scleroderma, contributing to swallowing difficulties. No current issues with liquid swallowing, indicating motility-related problems are not severe. Experiences anticipatory cough before eating, possibly due to salivation or oral secretions near the airway.  Possibly postnasal drip  Scleroderma with systemic sclerosis Scleroderma with systemic sclerosis contributing to esophageal dysmotility and GERD. Current management includes IVIG, associated with increased fatigue and cough post-infusion. Discussion of extending IVIG intervals to 12 weeks. Reports variability in bowel habits post-IVIG, not concerning at this time. - Continue current IVIG regimen with planned extension to 12-week  intervals per her team at [facility].  Hiatal hernia Presence of a 2 cm hiatal hernia on EGD. Symptoms include discomfort when bending over, likely due to anatomical changes. No current intervention planned as symptoms are manageable.  Chronic cough Chronic cough potentially related to GERD, esophageal dysmotility, or nasal issues. Cough worsens post-IVIG and is alleviated by warmth, suggesting a possible neuropathic component. No clear aspiration or swallowing issues identified. Experiences cough when transitioning from warm to cool environments, indicating possible airway involvement. - Monitor cough symptoms with reduced vonoprazan dose. - Consider ENT evaluation for nasal contribution to cough.  Nasal dryness and rhinorrhea Recent onset of nasal dryness, rhinorrhea, and epistaxis. Possible allergic rhinitis or autonomic rhinorrhea. Reports large nasal scabs and increased nasal secretions, possibly related to temperature changes. - Consider referral to ENT for evaluation of nasal symptoms. - Continue using Vaseline for nasal dryness.  History of colonic polyps Adenomatous polyp in 2016, none in 2019 -Colonoscopy interval would be 5 to 10 years from 2019, we can discuss going forward but deferred for now  45 minutes total spent today including patient facing time, coordination of care, reviewing medical history/procedures/pertinent radiology studies, and documentation of the encounter.

## 2023-12-03 NOTE — Patient Instructions (Addendum)
 We have sent the following medications to your pharmacy for you to pick up at your convenience: Voquezna  10 mg daily.   _______________________________________________________  If your blood pressure at your visit was 140/90 or greater, please contact your primary care physician to follow up on this.  _______________________________________________________  If you are age 75 or older, your body mass index should be between 23-30. Your Body mass index is 23.48 kg/m. If this is out of the aforementioned range listed, please consider follow up with your Primary Care Provider.  If you are age 40 or younger, your body mass index should be between 19-25. Your Body mass index is 23.48 kg/m. If this is out of the aformentioned range listed, please consider follow up with your Primary Care Provider.   ________________________________________________________  The [facility] providers would like to encourage you to use MYCHART to communicate with providers for non-urgent requests or questions.  Due to long hold times on the telephone, sending your provider a message by [facility] may be a faster and more efficient way to get a response.  Please allow 48 business hours for a response.  Please remember that this is for non-urgent requests.  _______________________________________________________

## 2023-12-11 ENCOUNTER — Encounter: Payer: Self-pay | Admitting: Hematology & Oncology

## 2023-12-11 ENCOUNTER — Other Ambulatory Visit: Payer: Self-pay | Admitting: *Deleted

## 2023-12-11 DIAGNOSIS — C50412 Malignant neoplasm of upper-outer quadrant of left female breast: Secondary | ICD-10-CM

## 2023-12-12 ENCOUNTER — Ambulatory Visit (HOSPITAL_COMMUNITY)
Admission: RE | Admit: 2023-12-12 | Discharge: 2023-12-12 | Disposition: A | Discharge status: Home / Self Care | Source: Ambulatory Visit | Attending: Internal Medicine | Admitting: Internal Medicine

## 2023-12-12 ENCOUNTER — Encounter (HOSPITAL_COMMUNITY): Payer: Self-pay | Admitting: Internal Medicine

## 2023-12-12 ENCOUNTER — Ambulatory Visit (HOSPITAL_BASED_OUTPATIENT_CLINIC_OR_DEPARTMENT_OTHER)
Admission: RE | Admit: 2023-12-12 | Discharge: 2023-12-12 | Disposition: A | Discharge status: Home / Self Care | Source: Ambulatory Visit | Attending: Internal Medicine | Admitting: Internal Medicine

## 2023-12-12 VITALS — BP 122/70 | HR 87 | Wt 137.2 lb

## 2023-12-12 DIAGNOSIS — I272 Pulmonary hypertension, unspecified: Secondary | ICD-10-CM | POA: Diagnosis not present

## 2023-12-12 DIAGNOSIS — I3139 Other pericardial effusion (noninflammatory): Secondary | ICD-10-CM | POA: Diagnosis not present

## 2023-12-12 DIAGNOSIS — E785 Hyperlipidemia, unspecified: Secondary | ICD-10-CM | POA: Diagnosis not present

## 2023-12-12 DIAGNOSIS — J8489 Other specified interstitial pulmonary diseases: Secondary | ICD-10-CM | POA: Diagnosis not present

## 2023-12-12 DIAGNOSIS — M3481 Systemic sclerosis with lung involvement: Secondary | ICD-10-CM | POA: Insufficient documentation

## 2023-12-12 DIAGNOSIS — I10 Essential (primary) hypertension: Secondary | ICD-10-CM | POA: Insufficient documentation

## 2023-12-12 DIAGNOSIS — I082 Rheumatic disorders of both aortic and tricuspid valves: Secondary | ICD-10-CM | POA: Insufficient documentation

## 2023-12-12 DIAGNOSIS — M349 Systemic sclerosis, unspecified: Secondary | ICD-10-CM

## 2023-12-12 DIAGNOSIS — Z923 Personal history of irradiation: Secondary | ICD-10-CM | POA: Diagnosis not present

## 2023-12-12 DIAGNOSIS — Z79899 Other long term (current) drug therapy: Secondary | ICD-10-CM | POA: Insufficient documentation

## 2023-12-12 DIAGNOSIS — Z853 Personal history of malignant neoplasm of breast: Secondary | ICD-10-CM | POA: Insufficient documentation

## 2023-12-12 LAB — ECHOCARDIOGRAM COMPLETE: S' Lateral: 2.4 cm

## 2023-12-12 NOTE — Patient Instructions (Signed)
 Your physician recommends that you schedule a follow-up appointment in: 1 year with an echocardiogram (May 2026), **PLEASE CALL OUR OFFICE IN MARCH TO SCHEDULE THIS APPOINTMENT

## 2023-12-12 NOTE — Progress Notes (Addendum)
 ADVANCED HF CLINIC NOTE  Referring Physician: Dr. Bertrum Brodie Primary Care: Copland, Skipper Dumas, MD Primary Cardiologist: None Rheumatology: Dr. Mason Sole at [facility] and Dr. Reesa Cannon @ [facility]  HPI:  [name] is a 73 y/o woman with scleroderma with related ILD, previous breast CA treated with XRT 2013 referred for further evaluation of possible PAH  She was first diagnosed with scleroderma in 2017 based on a high titer ANA and skin thickening. She has been followed previously in the [facility]D center and now followed by Dr .Bertrum Brodie. She was started on CellCept in 12/18 with significant improvement in FVC and diffusion capacity. Her CellCept was stopped in January 2023 due to prolonged episode of shingles.   Admitted to [facility] for scleroderma renal crisis in late January 24. BP 225/117. By the time of discharge, she had improvement but not normalization of her creatinine (peak Scr 2.8. down to 1.35). Blood pressure was better controlled. Mycophenolate was added back due to a worsening of her skin disease (now on 1000mg  BID). Since she was discharged from the hospital, she feels like she is slowly improving but continues to have increased edema.  She was seen in Pulmonary at [facility] (2/24) who reviewed data and agreed with continuing Cellcept in setting of worsening lung function   PFT 05/14/2022 [facility]  FEV1/FVC 77  FVC 2.05-73%  FEV1 1.57-73%  TLC 3.09-62%  RV 1.04-49%  RV/TLC normal DLCO 10.20-52%  DLCO/Va   Consistent with mild restriction and moderate reduction in diffusion capacity. These are significantly worse than her PFTs from 2022, when she had an FVC of 2.75 and a DLCO of 12.62   Echo: 1.27.2024 during admit for renal crisis LVEF >60-65% G1 DD RV normal. IVC normal: Moderate pericardial effusion without signs of tamponade  I saw her for first time in 3/24. Here for f/u with her husband. RHC recommended which showed mild PAH. Reports some tongue swelling on lisinopril  - which has  improved with B12 supplementation  RA = 6 RV = 39/12 PA = 39/10 (24) PCW = 6 Fick cardiac output/index = 5.7/3.4 PVR = 3.2 WU Ao sat = 99% PA sat = 74%, 77% SVC sat 75%  Echo 11/18/22: EF 65% RV normal. Moderate pericardial effusion. No tamponade.   cMRI: 02/14/23: EF 53% RV 51% no myocarditis   Echo 03/24/23: EF 60-65% No effusion Personally reviewed  Inflammatory markers still elevated despite CellCept. Started on IVIG 12/23/22. Had 7 months of monthly infusions now spacing to q8weeks. Will eventually go to every 12 weeks. (Managed by [facility] in conjunction with Dr. Maria Shiner). CK has normalized. CrP 0.6-1.3 SCr 1.0 (10/27/23)   Here with her husband for f/u. Remains active. 2 days per week exercising with exercise therapist. Making good progress. No CP or SOB. No edema.   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 Charon Copper at [facility]   systemic sclerosis , diffuse/   rheumotologist:  dr Mason Sole at [facility]---  lov 07-22-2017 (as of 07-25-2017 note not available to read)---  per pt has appt. w/ specialist at [facility]   Skin thickening    hands, arms, legs  caused by scleroderma   Systemic sclerosis (HCC)    UTI (urinary tract infection)     Current Outpatient Medications  Medication Sig Dispense Refill   acetaminophen  (TYLENOL ) 650 MG CR tablet Take 650 mg by mouth daily as needed for pain.     diphenhydrAMINE  (BENADRYL ) 25 mg capsule Take 25 mg by mouth every 6 (six) hours as needed for allergies. Allergies and before IVIG treatment.     ELDERBERRY PO Take 1 oz by mouth 4 (four) times daily as needed. Liquid     furosemide  (LASIX ) 20 MG tablet Take 1 tablet (20 mg total) by mouth daily. 90 tablet 0   losartan  (COZAAR ) 25 MG tablet Take 25 mg by mouth in the morning and at bedtime.     Multiple Vitamin (MULTIVITAMIN) capsule Take 1 capsule by mouth daily.     mycophenolate (CELLCEPT) 500 MG tablet Take 1,500 mg by mouth 2 (two) times daily.     sodium chloride  (OCEAN) 0.65 % SOLN nasal spray Place 1 spray into both nostrils as needed for congestion.     SYNTHROID  100 MCG tablet Take 1 tablet (100 mcg total) by mouth daily before breakfast. 90 tablet 3   Vonoprazan Fumarate  (VOQUEZNA ) 10 MG TABS Take 1 tablet by mouth daily. 30 tablet 1   No current facility-administered medications for this encounter.    Allergies  Allergen Reactions   Betadine [Povidone Iodine] Rash   Epinephrine Other (See Comments)    Severe headaches    Iodinated Contrast Media Other (See Comments) and Rash    "per pt: recently had CT 08/ 2018 even through given pre-medication, IVP still caused rash"  "per pt recently had CT 08/ 2018 even through given pre-medication, IVP still caused rash"   Iodine Rash and Other (See Comments)   Oxycodone  Anxiety   Prednisone  Anaphylaxis    Per  Nephrology pt should avoid this medication because she has Scleroderma and it will interfere with her Kidneys,    Gabapentin Other (See Comments)    Dizziness   Lidocaine  Rash   Nickel Rash and Other (See Comments)    Rash and blisters   Other Other (See Comments) and Rash    Hospital gown  Product containing penicillin (product)  Substance with sulfonamide structure and antibacterial mechanism of action (substance)   Penicillins Rash and Other (See Comments)    Rash and blisters Has patient had a PCN reaction causing immediate rash, facial/tongue/throat swelling, SOB or lightheadedness with hypotension: Yes Has patient had a PCN reaction causing severe rash involving mucus membranes or skin necrosis: No Has patient had a PCN  reaction that required hospitalization: No Has patient had a PCN reaction occurring within the last 10 years: No If all of the above answers are "NO", then may proceed with Cephalosporin use.    Sulfa Antibiotics Rash      Social History   Socioeconomic History   Marital status: Married    Spouse name: Not on file   Number of children: 1   Years of education: 16   Highest education level: Not on file  Occupational History   Occupation: self employed  Tobacco Use   Smoking status: Never   Smokeless tobacco: Never  Vaping Use   Vaping status: Never Used  Substance and Sexual Activity   Alcohol  use: Yes    Comment: socially   Drug use: No   Sexual activity: Yes    Birth control/protection: Post-menopausal  Other Topics Concern   Not on file  Social History Narrative   Lives with husband in a 2 story home.  Has 1 daughter.     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     Education: college.    Social Drivers of Corporate investment banker Strain: Low Risk  (11/25/2023)   Overall Financial Resource Strain (CARDIA)    Difficulty of Paying Living Expenses: Not hard at all  Food Insecurity: No Food Insecurity (11/25/2023)   Hunger Vital Sign     Worried About Running Out of Food in the Last Year: Never true    Ran Out of Food in the Last Year: Never true  Transportation Needs: No Transportation Needs (11/25/2023)   PRAPARE - Administrator, Civil Service (Medical): No    Lack of Transportation (Non-Medical): No  Physical Activity: Insufficiently Active (11/25/2023)   Exercise Vital Sign    Days of Exercise per Week: 2 days    Minutes of Exercise per Session: 30 min  Stress: No Stress Concern Present (11/25/2023)   [employer] of Occupational Health - Occupational Stress Questionnaire    Feeling of Stress : Not at all  Social Connections: Socially Integrated (11/25/2023)   Social Connection and Isolation Panel [NHANES]    Frequency of Communication with Friends and Family: More than three times a week    Frequency of Social Gatherings with Friends and Family: More than three times a week    Attends Religious Services: More than 4 times per year    Active Member of [employer] or Organizations: Yes    Attends Engineer, structural: More than 4 times per year    Marital Status: Married  Catering manager Violence: Not At Risk (11/25/2023)   Humiliation, Afraid, Rape, and Kick questionnaire    Fear of Current or Ex-Partner: No    Emotionally Abused: No    Physically Abused: No    Sexually Abused: No      Family History  Problem Relation Age of Onset   Heart disease Mother    Heart failure Mother    Heart disease Father    Ovarian cancer Maternal Aunt    Heart disease Paternal Uncle        multiple   Autoimmune disease Sister    Arrhythmia Sister    Anemia Sister    CAD Brother    Colon cancer Neg Hx    Stomach cancer Neg Hx    Esophageal cancer Neg Hx    Rectal cancer Neg Hx     Vitals:   12/12/23 0843  BP: 122/70  Pulse: 87  SpO2: 98%  Weight:  62.2 kg (137 lb 3.2 oz)    PHYSICAL EXAM: General:  Well appearing. No resp difficulty HEENT: normal +telengectasias  skin tightening Neck: supple.  no JVD. Carotids 2+ bilat; no bruits. No lymphadenopathy or thryomegaly appreciated. Cor: PMI nondisplaced. Regular rate & rhythm. No rubs, gallops or murmurs. Lungs: clear Abdomen: soft, nontender, nondistended. No hepatosplenomegaly. No bruits or masses. Good bowel sounds. Extremities: no cyanosis, clubbing, rash, edema + arthritic changes Neuro: alert & orientedx3, cranial nerves grossly intact. moves all 4 extremities w/o difficulty. Affect pleasant   ASSESSMENT & PLAN:  1. Scleroderma c/b ILD - Per [facility] - Continue mycophenylate - Had refractory flare and has been on IVIG which is being weaned.  - Labs/inflammatory mrkers reviewed  2. Pericardial effusion - Echo 1/24: Effusion small to moderate. No tamponade - Echo 11/18/22: EF 65% RV normal. Moderate pericardial effusion (no change). No tamponade.  - Treated with IVIG for persistent flare in 6/24. 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 In setting of scleroderma would favor treatment with ERA but she has been reluctant - Echo today 12/12/23 EF 70% RV normal RVSP Trivial anterior effusion Personally reviewed - PA pressures stable/improved. We again discussed possible PAH therapy but have decided to defer. Will follow echos closely. She will let me know if exercise capacity has changed  4. ILD - followed by Dr. Bertrum Brodie  5. Scleroderma renal crisis 1/24 - resolved. Remains on mycophenylate and ACE-I - following with Renal  - had some tongue swelling on lisinopril  - resolved with B12 supplementation - I switched her to losartan . BP well controlled - No change    Jules Oar, MD  9:31 AM

## 2023-12-15 ENCOUNTER — Other Ambulatory Visit

## 2023-12-15 ENCOUNTER — Ambulatory Visit

## 2023-12-15 ENCOUNTER — Inpatient Hospital Stay: Attending: Hematology & Oncology

## 2023-12-15 ENCOUNTER — Inpatient Hospital Stay

## 2023-12-15 ENCOUNTER — Ambulatory Visit: Admitting: Hematology & Oncology

## 2023-12-15 ENCOUNTER — Encounter: Payer: Self-pay | Admitting: Hematology & Oncology

## 2023-12-15 ENCOUNTER — Inpatient Hospital Stay (HOSPITAL_BASED_OUTPATIENT_CLINIC_OR_DEPARTMENT_OTHER): Admitting: Hematology & Oncology

## 2023-12-15 ENCOUNTER — Other Ambulatory Visit: Payer: Self-pay

## 2023-12-15 VITALS — BP 114/65 | HR 80 | Temp 97.8°F | Resp 18 | Ht 64.0 in | Wt 136.0 lb

## 2023-12-15 VITALS — BP 105/55 | HR 77 | Temp 97.9°F | Resp 18

## 2023-12-15 DIAGNOSIS — D631 Anemia in chronic kidney disease: Secondary | ICD-10-CM | POA: Diagnosis not present

## 2023-12-15 DIAGNOSIS — Z170 Estrogen receptor positive status [ER+]: Secondary | ICD-10-CM

## 2023-12-15 DIAGNOSIS — Z923 Personal history of irradiation: Secondary | ICD-10-CM | POA: Insufficient documentation

## 2023-12-15 DIAGNOSIS — C50412 Malignant neoplasm of upper-outer quadrant of left female breast: Secondary | ICD-10-CM | POA: Diagnosis not present

## 2023-12-15 DIAGNOSIS — M349 Systemic sclerosis, unspecified: Secondary | ICD-10-CM

## 2023-12-15 DIAGNOSIS — R059 Cough, unspecified: Secondary | ICD-10-CM | POA: Insufficient documentation

## 2023-12-15 DIAGNOSIS — D649 Anemia, unspecified: Secondary | ICD-10-CM | POA: Diagnosis not present

## 2023-12-15 DIAGNOSIS — C50912 Malignant neoplasm of unspecified site of left female breast: Secondary | ICD-10-CM | POA: Diagnosis not present

## 2023-12-15 DIAGNOSIS — Z79899 Other long term (current) drug therapy: Secondary | ICD-10-CM | POA: Diagnosis not present

## 2023-12-15 DIAGNOSIS — Z79624 Long term (current) use of inhibitors of nucleotide synthesis: Secondary | ICD-10-CM | POA: Insufficient documentation

## 2023-12-15 DIAGNOSIS — Z79890 Hormone replacement therapy (postmenopausal): Secondary | ICD-10-CM | POA: Insufficient documentation

## 2023-12-15 DIAGNOSIS — D8989 Other specified disorders involving the immune mechanism, not elsewhere classified: Secondary | ICD-10-CM | POA: Diagnosis not present

## 2023-12-15 DIAGNOSIS — M3500 Sicca syndrome, unspecified: Secondary | ICD-10-CM

## 2023-12-15 DIAGNOSIS — D509 Iron deficiency anemia, unspecified: Secondary | ICD-10-CM

## 2023-12-15 LAB — CBC WITH DIFFERENTIAL (CANCER CENTER ONLY)
Abs Immature Granulocytes: 0.03 10*3/uL (ref 0.00–0.07)
Basophils Absolute: 0 10*3/uL (ref 0.0–0.1)
Basophils Relative: 1 %
Eosinophils Absolute: 0.2 10*3/uL (ref 0.0–0.5)
Eosinophils Relative: 2 %
HCT: 27.7 % — ABNORMAL LOW (ref 36.0–46.0)
Hemoglobin: 8.8 g/dL — ABNORMAL LOW (ref 12.0–15.0)
Immature Granulocytes: 0 %
Lymphocytes Relative: 8 %
Lymphs Abs: 0.6 10*3/uL — ABNORMAL LOW (ref 0.7–4.0)
MCH: 29.6 pg (ref 26.0–34.0)
MCHC: 31.8 g/dL (ref 30.0–36.0)
MCV: 93.3 fL (ref 80.0–100.0)
Monocytes Absolute: 0.6 10*3/uL (ref 0.1–1.0)
Monocytes Relative: 7 %
Neutro Abs: 6.6 10*3/uL (ref 1.7–7.7)
Neutrophils Relative %: 82 %
Platelet Count: 241 10*3/uL (ref 150–400)
RBC: 2.97 MIL/uL — ABNORMAL LOW (ref 3.87–5.11)
RDW: 14.2 % (ref 11.5–15.5)
WBC Count: 8 10*3/uL (ref 4.0–10.5)
nRBC: 0 % (ref 0.0–0.2)

## 2023-12-15 LAB — CMP (CANCER CENTER ONLY)
ALT: 8 U/L (ref 0–44)
AST: 19 U/L (ref 15–41)
Albumin: 4.2 g/dL (ref 3.5–5.0)
Alkaline Phosphatase: 58 U/L (ref 38–126)
Anion gap: 8 (ref 5–15)
BUN: 18 mg/dL (ref 8–23)
CO2: 23 mmol/L (ref 22–32)
Calcium: 8.9 mg/dL (ref 8.9–10.3)
Chloride: 104 mmol/L (ref 98–111)
Creatinine: 1.11 mg/dL — ABNORMAL HIGH (ref 0.44–1.00)
GFR, Estimated: 52 mL/min — ABNORMAL LOW (ref >60)
Glucose, Bld: 110 mg/dL — ABNORMAL HIGH (ref 70–99)
Potassium: 3.5 mmol/L (ref 3.5–5.1)
Sodium: 135 mmol/L (ref 135–145)
Total Bilirubin: 0.3 mg/dL (ref 0.0–1.2)
Total Protein: 6.8 g/dL (ref 6.5–8.1)

## 2023-12-15 LAB — C-REACTIVE PROTEIN: CRP: 0.6 mg/dL (ref <1.0)

## 2023-12-15 LAB — CK: Total CK: 80 U/L (ref 38–234)

## 2023-12-15 LAB — LACTATE DEHYDROGENASE: LDH: 191 U/L (ref 98–192)

## 2023-12-15 LAB — FERRITIN: Ferritin: 123 ng/mL (ref 11–307)

## 2023-12-15 MED ORDER — BENZONATATE 100 MG PO CAPS: 100.0000 mg | ORAL_CAPSULE | Freq: Three times a day (TID) | ORAL | 3 refills | Status: AC | PRN

## 2023-12-15 MED ORDER — IMMUNE GLOBULIN (HUMAN) 20 GM/200ML IV SOLN
25.0000 g | Freq: Once | INTRAVENOUS | Status: DC
  Filled 2023-12-15: qty 250

## 2023-12-15 MED ORDER — DIPHENHYDRAMINE HCL 25 MG PO CAPS: 25.0000 mg | ORAL_CAPSULE | Freq: Once | ORAL | Status: DC

## 2023-12-15 MED ORDER — IMMUNE GLOBULIN (HUMAN) 20 GM/200ML IV SOLN
25.0000 g | Freq: Once | INTRAVENOUS | Status: AC
  Administered 2023-12-15: 25 g via INTRAVENOUS
  Filled 2023-12-15: qty 50

## 2023-12-15 MED ORDER — DEXTROSE 5 % IV SOLN: INTRAVENOUS | Status: DC

## 2023-12-15 MED ORDER — ACETAMINOPHEN 325 MG PO TABS
650.0000 mg | ORAL_TABLET | Freq: Once | ORAL | Status: DC
Start: 2023-12-15 — End: 2023-12-15

## 2023-12-15 MED ORDER — SODIUM CHLORIDE 0.9% FLUSH
10.0000 mL | Freq: Once | INTRAVENOUS | Status: AC
  Administered 2023-12-15: 10 mL via INTRAVENOUS

## 2023-12-15 MED ORDER — DARBEPOETIN ALFA 300 MCG/0.6ML IJ SOSY
300.0000 ug | PREFILLED_SYRINGE | Freq: Once | INTRAMUSCULAR | Status: AC
  Administered 2023-12-15: 300 ug via SUBCUTANEOUS
  Filled 2023-12-15: qty 0.6

## 2023-12-15 MED ORDER — HEPARIN SOD (PORK) LOCK FLUSH 100 UNIT/ML IV SOLN
500.0000 [IU] | Freq: Once | INTRAVENOUS | Status: AC
  Administered 2023-12-15: 500 [IU] via INTRAVENOUS

## 2023-12-15 NOTE — Progress Notes (Signed)
 Hematology and Oncology Follow Up Visit  [name] [id number] [birth year] 73 y.o. 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  She looks quite good.  She feels okay.  Unfortunately, looks like she may be having some issues with the IVIG.  She says that she has nosebleeds after she gets IVIG.  She also says that she has a bad cough after the IVIG.  I am a says I am not heard of this before.  However, I am sure that there are probably reports of epistaxis and cough.  She actually was seen by her rheumatologist at [facility] and also by the rheumatologist up at [facility].  They also were not sure as to why she had these symptoms.  Sounds like going to have to refer her to ENT.  I will see if Dr. Tellis [name] will be able to see her.  She is little more anemic.  We have to make sure that she gets Aranesp .  Her swallowing is doing a lot better.  Her aldolase has been coming down nicely.  Is 4.2.  Her total CK is 80.  We also been watching her CA 27.29.  The last level was 49.  I know that she is planning on going to [place] thing in October.  I want to make sure that everything is going to be okay when she travels.  Overall, I would say that her performance status is probably ECOG 1.    Medications:  Current Outpatient Medications:    acetaminophen  (TYLENOL ) 650 MG CR tablet, Take 650 mg by mouth daily as needed for pain., Disp: , Rfl:    diphenhydrAMINE  (BENADRYL ) 25 mg capsule, Take 25 mg by mouth every 6 (six) hours as needed for allergies. Allergies and before IVIG treatment., Disp: , Rfl:    ELDERBERRY PO, Take 1 oz by mouth 4 (four) times daily as needed. Liquid, Disp: ,  Rfl:    furosemide  (LASIX ) 20 MG tablet, Take 1 tablet (20 mg total) by mouth daily., Disp: 90 tablet, Rfl: 0   losartan  (COZAAR ) 25 MG tablet, Take 25 mg by mouth in the morning and at bedtime., Disp: , Rfl:    Multiple Vitamin (MULTIVITAMIN) capsule, Take 1 capsule by mouth daily., Disp: , Rfl:    mycophenolate (CELLCEPT) 500 MG tablet, Take 1,500 mg by mouth 2 (two) times daily., Disp: , Rfl:    sodium chloride  (OCEAN) 0.65 % SOLN nasal spray, Place 1 spray into both nostrils as needed for congestion., Disp: , Rfl:    SYNTHROID  100 MCG tablet, Take 1 tablet (100 mcg total) by mouth daily before breakfast., Disp: 90 tablet, Rfl: 3   Vonoprazan Fumarate  (VOQUEZNA ) 10 MG TABS, Take 1 tablet by mouth daily., Disp: 30 tablet, Rfl: 1 No current facility-administered medications for this visit.  Facility-Administered Medications Ordered in Other Visits:    dextrose  5 % solution, , Intravenous, Continuous, [name], Jennifer Donald, MD, Last Rate: 40 mL/hr at 12/15/23 1108, New Bag at 12/15/23 1108  Allergies:  Allergies  Allergen Reactions   Betadine [Povidone Iodine] Rash   Epinephrine Other (See Comments)    Severe headaches    Iodinated Contrast Media Other (See Comments) and Rash    "  per pt: recently had CT 08/ 2018 even through given pre-medication, IVP still caused rash"  "per pt recently had CT 08/ 2018 even through given pre-medication, IVP still caused rash"   Iodine Rash and Other (See Comments)   Oxycodone  Anxiety   Prednisone  Anaphylaxis    Per Nephrology pt should avoid this medication because she has Scleroderma and it will interfere with her Kidneys,    Gabapentin Other (See Comments)    Dizziness   Lidocaine  Rash   Nickel Rash and Other (See Comments)    Rash and blisters   Other Other (See Comments) and Rash    Hospital gown  Product containing penicillin (product)  Substance with sulfonamide structure and antibacterial mechanism of action (substance)   Penicillins Rash and  Other (See Comments)    Rash and blisters Has patient had a PCN reaction causing immediate rash, facial/tongue/throat swelling, SOB or lightheadedness with hypotension: Yes Has patient had a PCN reaction causing severe rash involving mucus membranes or skin necrosis: No Has patient had a PCN reaction that required hospitalization: No Has patient had a PCN reaction occurring within the last 10 years: No If all of the above answers are "NO", then may proceed with Cephalosporin use.    Sulfa Antibiotics Rash    Past Medical History, Surgical history, Social history, and Family History were reviewed and updated.  Review of Systems: Review of Systems  Constitutional:  Positive for fatigue.  HENT:  Negative.    Eyes: Negative.   Respiratory: Negative.    Cardiovascular: Negative.   Gastrointestinal: Negative.   Endocrine: Negative.   Genitourinary: Negative.    Musculoskeletal:  Positive for arthralgias and myalgias.  Skin:  Positive for rash.  Neurological: Negative.   Hematological: Negative.   Psychiatric/Behavioral: Negative.      Physical Exam: Temperature is 97.8.  Pulse 80.  Blood pressure 114/65.  Weight is 136 pounds.       Wt Readings from Last 3 Encounters:  12/15/23 136 lb (61.7 kg)  12/12/23 137 lb 3.2 oz (62.2 kg)  12/03/23 136 lb 12.8 oz (62.1 kg)    Physical Exam Vitals reviewed.  Constitutional:      Comments: Her breast exam shows right breast with no masses, edema or erythema.  There is no right axillary adenopathy.  Left breast shows the lumpectomy scar at about the 2 o'clock position.  There is little firmness at the lumpectomy site.  No distinct masses noted.  There is no left axillary adenopathy.  There is no left nipple discharge.  HENT:     Head: Normocephalic and atraumatic.  Eyes:     Pupils: Pupils are equal, round, and reactive to light.  Cardiovascular:     Rate and Rhythm: Normal rate and regular rhythm.     Heart sounds: Normal heart  sounds.  Pulmonary:     Effort: Pulmonary effort is normal.     Breath sounds: Normal breath sounds.  Abdominal:     General: Bowel sounds are normal.     Palpations: Abdomen is soft.  Musculoskeletal:        General: No tenderness or deformity. Normal range of motion.     Cervical back: Normal range of motion.  Lymphadenopathy:     Cervical: No cervical adenopathy.  Skin:    General: Skin is warm and dry.     Findings: No erythema or rash.     Comments: Skin exam shows some changes of the scleroderma.  This is mostly on the  face, around the lip area.  Her arms do show a little bit of skin thickening.  Neurological:     Mental Status: She is alert and oriented to person, place, and time.  Psychiatric:        Behavior: Behavior normal.        Thought Content: Thought content normal.        Judgment: Judgment normal.      Lab Results  Component Value Date   WBC 8.0 12/15/2023   HGB 8.8 (L) 12/15/2023   HCT 27.7 (L) 12/15/2023   MCV 93.3 12/15/2023   PLT 241 12/15/2023     Chemistry      Component Value Date/Time   NA 135 12/15/2023 0840   NA 144 01/27/2020 1148   NA 141 06/09/2017 1117   K 3.5 12/15/2023 0840   K 3.9 06/09/2017 1117   CL 104 12/15/2023 0840   CL 109 (H) 12/14/2012 1253   CO2 23 12/15/2023 0840   CO2 23 06/09/2017 1117   BUN 18 12/15/2023 0840   BUN 11 01/27/2020 1148   BUN 11.5 06/09/2017 1117   CREATININE 1.11 (H) 12/15/2023 0840   CREATININE 1.2 (H) 06/09/2017 1117      Component Value Date/Time   CALCIUM  8.9 12/15/2023 0840   CALCIUM  8.9 06/09/2017 1117   ALKPHOS 58 12/15/2023 0840   ALKPHOS 40 06/09/2017 1117   AST 19 12/15/2023 0840   AST 35 (H) 06/09/2017 1117   ALT 8 12/15/2023 0840   ALT 19 06/09/2017 1117   BILITOT 0.3 12/15/2023 0840   BILITOT 0.37 06/09/2017 1117      Impression and Plan: Ms. Macioce is a very charming 73 year old white female.  She has had a history of early stage breast cancer.  Again, she had a MRI of  the breast.  This did not show anything that looked suspicious for recurrence.  The MRI was done on 11/28/2023.  Clinically, her main problem has been the scleroderma.  We are giving her IVIG which seems to be helping her quite a bit.  Again, I am not sure about these reactions that she might be having.  We will have to see what ENT has to do say about all of this.  I would think that she may need to have some vessels cauterized in the nares.  Again, with her schedule, we have to schedule her IVIG a little bit differently.  The next IVIG will be in August.   Ivor Mars, MD 6/2/20251:38 PM

## 2023-12-15 NOTE — Patient Instructions (Signed)

## 2023-12-16 ENCOUNTER — Inpatient Hospital Stay

## 2023-12-16 VITALS — BP 104/60 | HR 83 | Temp 97.7°F | Resp 18

## 2023-12-16 DIAGNOSIS — M349 Systemic sclerosis, unspecified: Secondary | ICD-10-CM | POA: Diagnosis not present

## 2023-12-16 DIAGNOSIS — R059 Cough, unspecified: Secondary | ICD-10-CM | POA: Diagnosis not present

## 2023-12-16 DIAGNOSIS — D509 Iron deficiency anemia, unspecified: Secondary | ICD-10-CM

## 2023-12-16 DIAGNOSIS — D8989 Other specified disorders involving the immune mechanism, not elsewhere classified: Secondary | ICD-10-CM | POA: Diagnosis not present

## 2023-12-16 DIAGNOSIS — Z170 Estrogen receptor positive status [ER+]: Secondary | ICD-10-CM | POA: Diagnosis not present

## 2023-12-16 DIAGNOSIS — Z79899 Other long term (current) drug therapy: Secondary | ICD-10-CM | POA: Diagnosis not present

## 2023-12-16 DIAGNOSIS — C50912 Malignant neoplasm of unspecified site of left female breast: Secondary | ICD-10-CM | POA: Diagnosis not present

## 2023-12-16 LAB — ALDOLASE: Aldolase: 4.2 U/L (ref 3.3–10.3)

## 2023-12-16 LAB — IRON AND IRON BINDING CAPACITY (CC-WL,HP ONLY)
Iron: 95 ug/dL (ref 28–170)
Saturation Ratios: 31 % (ref 10.4–31.8)
TIBC: 308 ug/dL (ref 250–450)
UIBC: 213 ug/dL (ref 148–442)

## 2023-12-16 LAB — CANCER ANTIGEN 27.29: CA 27.29: 59.5 U/mL — ABNORMAL HIGH (ref 0.0–38.6)

## 2023-12-16 LAB — IGG, IGA, IGM
IgA: 121 mg/dL (ref 64–422)
IgG (Immunoglobin G), Serum: 1078 mg/dL (ref 586–1602)
IgM (Immunoglobulin M), Srm: 53 mg/dL (ref 26–217)

## 2023-12-16 MED ORDER — DIPHENHYDRAMINE HCL 25 MG PO CAPS: 25.0000 mg | ORAL_CAPSULE | Freq: Once | ORAL | Status: DC

## 2023-12-16 MED ORDER — HEPARIN SOD (PORK) LOCK FLUSH 100 UNIT/ML IV SOLN
500.0000 [IU] | Freq: Once | INTRAVENOUS | Status: AC
  Administered 2023-12-16: 500 [IU] via INTRAVENOUS

## 2023-12-16 MED ORDER — ACETAMINOPHEN 325 MG PO TABS
650.0000 mg | ORAL_TABLET | Freq: Once | ORAL | Status: DC
Start: 2023-12-16 — End: 2023-12-16

## 2023-12-16 MED ORDER — IMMUNE GLOBULIN (HUMAN) 20 GM/200ML IV SOLN
25.0000 g | Freq: Once | INTRAVENOUS | Status: AC
  Administered 2023-12-16: 25 g via INTRAVENOUS
  Filled 2023-12-16: qty 50

## 2023-12-16 MED ORDER — DEXTROSE 5 % IV SOLN: INTRAVENOUS | Status: DC

## 2023-12-16 MED ORDER — SODIUM CHLORIDE 0.9% FLUSH
10.0000 mL | Freq: Once | INTRAVENOUS | Status: AC
  Administered 2023-12-16: 10 mL via INTRAVENOUS

## 2023-12-16 NOTE — Patient Instructions (Signed)
 Immune Globulin Injection What is this medication? IMMUNE GLOBULIN (im MUNE GLOB yoo lin) treats many immune system conditions. It works by Designer, multimedia extra antibodies. Antibodies are proteins made by the immune system that help protect the body. This medicine may be used for other purposes; ask your health care provider or pharmacist if you have questions. COMMON BRAND NAME(S): ASCENIV, Baygam, BIVIGAM, Carimune, Carimune NF, cutaquig, Cuvitru, Flebogamma, Flebogamma DIF, GamaSTAN, GamaSTAN S/D, Gamimune N, Gammagard, Gammagard S/D, Gammaked, Gammaplex, Gammar-P IV, Gamunex, Gamunex-C, Hizentra, Iveegam, Iveegam EN, Octagam, Panglobulin, Panglobulin NF, panzyga, Polygam S/D, Privigen, Sandoglobulin, Venoglobulin-S, Vigam, Vivaglobulin, Xembify What should I tell my care team before I take this medication? They need to know if you have any of these conditions: Blood clotting disorder Condition where you have excess fluid in your body, such as heart failure or edema Dehydration Diabetes Have had blood clots Heart disease Immune system conditions Kidney disease Low levels of IgA Recent or upcoming vaccine An unusual or allergic reaction to immune globulin, other medications, foods, dyes, or preservatives Pregnant or trying to get pregnant Breastfeeding How should I use this medication? This medication is infused into a vein or under the skin. It is usually given by your care team in a hospital or clinic setting. It may also be given at home. If you get this medication at home, you will be taught how to prepare and give it. Use exactly as directed. Take it as directed on the prescription label at the same time every day. Keep taking it unless your care team tells you to stop. It is important that you put your used needles and syringes in a special sharps container. Do not put them in a trash can. If you do not have a sharps container, call your pharmacist or care team to get one. Talk to  your care team about the use of this medication in children. While it may be given to children for selected conditions, precautions do apply. Overdosage: If you think you have taken too much of this medicine contact a poison control center or emergency room at once. NOTE: This medicine is only for you. Do not share this medicine with others. What if I miss a dose? If you get this medication at the hospital or clinic: It is important not to miss your dose. Call your care team if you are unable to keep an appointment. If you give yourself this medication at home: If you miss a dose, take it as soon as you can. Then continue your normal schedule. If it is almost time for your next dose, take only that dose. Do not take double or extra doses. Call your care team with questions. What may interact with this medication? Live virus vaccines This list may not describe all possible interactions. Give your health care provider a list of all the medicines, herbs, non-prescription drugs, or dietary supplements you use. Also tell them if you smoke, drink alcohol, or use illegal drugs. Some items may interact with your medicine. What should I watch for while using this medication? Your condition will be monitored carefully while you are receiving this medication. Tell your care team if your symptoms do not start to get better or if they get worse. You may need blood work done while you are taking this medication. This medication increases the risk of blood clots. People with heart, blood vessel, or blood clotting conditions are more likely to develop a blood clot. Other risk factors include advanced age, estrogen  use, tobacco use, lack of movement, and being overweight. This medication can decrease the response to a vaccine. If you need to get vaccinated, tell your care team if you have received this medication within the last year. Extra booster doses may be needed. Talk to your care team to see if a different  vaccination schedule is needed. If you have diabetes, you may get a falsely elevated blood sugar reading. Talk to your care team about how to check your blood sugar while taking this medication. What side effects may I notice from receiving this medication? Side effects that you should report to your care team as soon as possible: Allergic reactions--skin rash, itching, hives, swelling of the face, lips, tongue, or throat Blood clot--pain, swelling, or warmth in the leg, shortness of breath, chest pain Fever, neck pain or stiffness, sensitivity to light, headache, nausea, vomiting, confusion, which may be signs of meningitis Hemolytic anemia--unusual weakness or fatigue, dizziness, headache, trouble breathing, dark urine, yellowing skin or eyes Kidney injury--decrease in the amount of urine, swelling of the ankles, hands, or feet Low sodium level--muscle weakness, fatigue, dizziness, headache, confusion Shortness of breath or trouble breathing, cough, unusual weakness or fatigue, blue skin or lips Side effects that usually do not require medical attention (report these to your care team if they continue or are bothersome): Chills Diarrhea Fever Headache Nausea This list may not describe all possible side effects. Call your doctor for medical advice about side effects. You may report side effects to FDA at 1-800-FDA-1088. Where should I keep my medication? Keep out of the reach of children and pets. You will be instructed on how to store this medication. Get rid of any unused medication after the expiration date. To get rid of medications that are no longer needed or have expired: Take the medication to a medication take-back program. Check with your pharmacy or law enforcement to find a location. If you cannot return the medication, ask your pharmacist or care team how to get rid of this medication safely. NOTE: This sheet is a summary. It may not cover all possible information. If you have  questions about this medicine, talk to your doctor, pharmacist, or health care provider.  2024 Elsevier/Gold Standard (2023-06-13 00:00:00)

## 2023-12-17 ENCOUNTER — Inpatient Hospital Stay

## 2023-12-17 VITALS — BP 100/52 | HR 79 | Temp 97.4°F | Resp 20

## 2023-12-17 DIAGNOSIS — R059 Cough, unspecified: Secondary | ICD-10-CM | POA: Diagnosis not present

## 2023-12-17 DIAGNOSIS — Z79899 Other long term (current) drug therapy: Secondary | ICD-10-CM | POA: Diagnosis not present

## 2023-12-17 DIAGNOSIS — M349 Systemic sclerosis, unspecified: Secondary | ICD-10-CM | POA: Diagnosis not present

## 2023-12-17 DIAGNOSIS — D509 Iron deficiency anemia, unspecified: Secondary | ICD-10-CM

## 2023-12-17 DIAGNOSIS — C50912 Malignant neoplasm of unspecified site of left female breast: Secondary | ICD-10-CM | POA: Diagnosis not present

## 2023-12-17 DIAGNOSIS — D8989 Other specified disorders involving the immune mechanism, not elsewhere classified: Secondary | ICD-10-CM | POA: Diagnosis not present

## 2023-12-17 DIAGNOSIS — Z170 Estrogen receptor positive status [ER+]: Secondary | ICD-10-CM | POA: Diagnosis not present

## 2023-12-17 MED ORDER — SODIUM CHLORIDE 0.9% FLUSH
10.0000 mL | INTRAVENOUS | Status: DC | PRN
  Administered 2023-12-17: 10 mL via INTRAVENOUS

## 2023-12-17 MED ORDER — DEXTROSE 5 % IV SOLN: INTRAVENOUS | Status: DC

## 2023-12-17 MED ORDER — HEPARIN SOD (PORK) LOCK FLUSH 100 UNIT/ML IV SOLN
500.0000 [IU] | Freq: Once | INTRAVENOUS | Status: AC
  Administered 2023-12-17: 500 [IU] via INTRAVENOUS

## 2023-12-17 MED ORDER — IMMUNE GLOBULIN (HUMAN) 20 GM/200ML IV SOLN
25.0000 g | Freq: Once | INTRAVENOUS | Status: AC
  Administered 2023-12-17: 25 g via INTRAVENOUS
  Filled 2023-12-17: qty 200

## 2023-12-17 NOTE — Patient Instructions (Signed)
 Immune Globulin Injection What is this medication? IMMUNE GLOBULIN (im MUNE GLOB yoo lin) treats many immune system conditions. It works by Designer, multimedia extra antibodies. Antibodies are proteins made by the immune system that help protect the body. This medicine may be used for other purposes; ask your health care provider or pharmacist if you have questions. COMMON BRAND NAME(S): ASCENIV, Baygam, BIVIGAM, Carimune, Carimune NF, cutaquig, Cuvitru, Flebogamma, Flebogamma DIF, GamaSTAN, GamaSTAN S/D, Gamimune N, Gammagard, Gammagard S/D, Gammaked, Gammaplex, Gammar-P IV, Gamunex, Gamunex-C, Hizentra, Iveegam, Iveegam EN, Octagam, Panglobulin, Panglobulin NF, panzyga, Polygam S/D, Privigen, Sandoglobulin, Venoglobulin-S, Vigam, Vivaglobulin, Xembify What should I tell my care team before I take this medication? They need to know if you have any of these conditions: Blood clotting disorder Condition where you have excess fluid in your body, such as heart failure or edema Dehydration Diabetes Have had blood clots Heart disease Immune system conditions Kidney disease Low levels of IgA Recent or upcoming vaccine An unusual or allergic reaction to immune globulin, other medications, foods, dyes, or preservatives Pregnant or trying to get pregnant Breastfeeding How should I use this medication? This medication is infused into a vein or under the skin. It is usually given by your care team in a hospital or clinic setting. It may also be given at home. If you get this medication at home, you will be taught how to prepare and give it. Use exactly as directed. Take it as directed on the prescription label at the same time every day. Keep taking it unless your care team tells you to stop. It is important that you put your used needles and syringes in a special sharps container. Do not put them in a trash can. If you do not have a sharps container, call your pharmacist or care team to get one. Talk to  your care team about the use of this medication in children. While it may be given to children for selected conditions, precautions do apply. Overdosage: If you think you have taken too much of this medicine contact a poison control center or emergency room at once. NOTE: This medicine is only for you. Do not share this medicine with others. What if I miss a dose? If you get this medication at the hospital or clinic: It is important not to miss your dose. Call your care team if you are unable to keep an appointment. If you give yourself this medication at home: If you miss a dose, take it as soon as you can. Then continue your normal schedule. If it is almost time for your next dose, take only that dose. Do not take double or extra doses. Call your care team with questions. What may interact with this medication? Live virus vaccines This list may not describe all possible interactions. Give your health care provider a list of all the medicines, herbs, non-prescription drugs, or dietary supplements you use. Also tell them if you smoke, drink alcohol, or use illegal drugs. Some items may interact with your medicine. What should I watch for while using this medication? Your condition will be monitored carefully while you are receiving this medication. Tell your care team if your symptoms do not start to get better or if they get worse. You may need blood work done while you are taking this medication. This medication increases the risk of blood clots. People with heart, blood vessel, or blood clotting conditions are more likely to develop a blood clot. Other risk factors include advanced age, estrogen  use, tobacco use, lack of movement, and being overweight. This medication can decrease the response to a vaccine. If you need to get vaccinated, tell your care team if you have received this medication within the last year. Extra booster doses may be needed. Talk to your care team to see if a different  vaccination schedule is needed. If you have diabetes, you may get a falsely elevated blood sugar reading. Talk to your care team about how to check your blood sugar while taking this medication. What side effects may I notice from receiving this medication? Side effects that you should report to your care team as soon as possible: Allergic reactions--skin rash, itching, hives, swelling of the face, lips, tongue, or throat Blood clot--pain, swelling, or warmth in the leg, shortness of breath, chest pain Fever, neck pain or stiffness, sensitivity to light, headache, nausea, vomiting, confusion, which may be signs of meningitis Hemolytic anemia--unusual weakness or fatigue, dizziness, headache, trouble breathing, dark urine, yellowing skin or eyes Kidney injury--decrease in the amount of urine, swelling of the ankles, hands, or feet Low sodium level--muscle weakness, fatigue, dizziness, headache, confusion Shortness of breath or trouble breathing, cough, unusual weakness or fatigue, blue skin or lips Side effects that usually do not require medical attention (report these to your care team if they continue or are bothersome): Chills Diarrhea Fever Headache Nausea This list may not describe all possible side effects. Call your doctor for medical advice about side effects. You may report side effects to FDA at 1-800-FDA-1088. Where should I keep my medication? Keep out of the reach of children and pets. You will be instructed on how to store this medication. Get rid of any unused medication after the expiration date. To get rid of medications that are no longer needed or have expired: Take the medication to a medication take-back program. Check with your pharmacy or law enforcement to find a location. If you cannot return the medication, ask your pharmacist or care team how to get rid of this medication safely. NOTE: This sheet is a summary. It may not cover all possible information. If you have  questions about this medicine, talk to your doctor, pharmacist, or health care provider.  2024 Elsevier/Gold Standard (2023-06-13 00:00:00)

## 2023-12-18 ENCOUNTER — Inpatient Hospital Stay

## 2023-12-18 VITALS — BP 116/69 | HR 80 | Temp 97.7°F | Resp 18

## 2023-12-18 DIAGNOSIS — Z170 Estrogen receptor positive status [ER+]: Secondary | ICD-10-CM | POA: Diagnosis not present

## 2023-12-18 DIAGNOSIS — M349 Systemic sclerosis, unspecified: Secondary | ICD-10-CM | POA: Diagnosis not present

## 2023-12-18 DIAGNOSIS — Z79899 Other long term (current) drug therapy: Secondary | ICD-10-CM | POA: Diagnosis not present

## 2023-12-18 DIAGNOSIS — D499 Neoplasm of unspecified behavior of unspecified site: Secondary | ICD-10-CM

## 2023-12-18 DIAGNOSIS — D509 Iron deficiency anemia, unspecified: Secondary | ICD-10-CM

## 2023-12-18 DIAGNOSIS — D8989 Other specified disorders involving the immune mechanism, not elsewhere classified: Secondary | ICD-10-CM | POA: Diagnosis not present

## 2023-12-18 DIAGNOSIS — C50912 Malignant neoplasm of unspecified site of left female breast: Secondary | ICD-10-CM | POA: Diagnosis not present

## 2023-12-18 DIAGNOSIS — R059 Cough, unspecified: Secondary | ICD-10-CM | POA: Diagnosis not present

## 2023-12-18 MED ORDER — DIPHENHYDRAMINE HCL 25 MG PO CAPS: 25.0000 mg | ORAL_CAPSULE | Freq: Once | ORAL | Status: DC

## 2023-12-18 MED ORDER — SODIUM CHLORIDE 0.9% FLUSH
10.0000 mL | Freq: Once | INTRAVENOUS | Status: AC
  Administered 2023-12-18: 10 mL via INTRAVENOUS

## 2023-12-18 MED ORDER — IMMUNE GLOBULIN (HUMAN) 20 GM/200ML IV SOLN
25.0000 g | Freq: Once | INTRAVENOUS | Status: AC
  Administered 2023-12-18: 25 g via INTRAVENOUS
  Filled 2023-12-18: qty 50

## 2023-12-18 MED ORDER — DEXTROSE 5 % IV SOLN: INTRAVENOUS | Status: DC

## 2023-12-18 MED ORDER — ACETAMINOPHEN 325 MG PO TABS: 650.0000 mg | ORAL_TABLET | Freq: Once | ORAL | Status: DC

## 2023-12-18 MED ORDER — HEPARIN SOD (PORK) LOCK FLUSH 100 UNIT/ML IV SOLN
500.0000 [IU] | Freq: Once | INTRAVENOUS | Status: AC
  Administered 2023-12-18: 500 [IU] via INTRAVENOUS

## 2023-12-18 NOTE — Patient Instructions (Signed)
 Immune Globulin Injection What is this medication? IMMUNE GLOBULIN (im MUNE GLOB yoo lin) treats many immune system conditions. It works by Designer, multimedia extra antibodies. Antibodies are proteins made by the immune system that help protect the body. This medicine may be used for other purposes; ask your health care provider or pharmacist if you have questions. COMMON BRAND NAME(S): ASCENIV, Baygam, BIVIGAM, Carimune, Carimune NF, cutaquig, Cuvitru, Flebogamma, Flebogamma DIF, GamaSTAN, GamaSTAN S/D, Gamimune N, Gammagard, Gammagard S/D, Gammaked, Gammaplex, Gammar-P IV, Gamunex, Gamunex-C, Hizentra, Iveegam, Iveegam EN, Octagam, Panglobulin, Panglobulin NF, panzyga, Polygam S/D, Privigen, Sandoglobulin, Venoglobulin-S, Vigam, Vivaglobulin, Xembify What should I tell my care team before I take this medication? They need to know if you have any of these conditions: Blood clotting disorder Condition where you have excess fluid in your body, such as heart failure or edema Dehydration Diabetes Have had blood clots Heart disease Immune system conditions Kidney disease Low levels of IgA Recent or upcoming vaccine An unusual or allergic reaction to immune globulin, other medications, foods, dyes, or preservatives Pregnant or trying to get pregnant Breastfeeding How should I use this medication? This medication is infused into a vein or under the skin. It is usually given by your care team in a hospital or clinic setting. It may also be given at home. If you get this medication at home, you will be taught how to prepare and give it. Use exactly as directed. Take it as directed on the prescription label at the same time every day. Keep taking it unless your care team tells you to stop. It is important that you put your used needles and syringes in a special sharps container. Do not put them in a trash can. If you do not have a sharps container, call your pharmacist or care team to get one. Talk to  your care team about the use of this medication in children. While it may be given to children for selected conditions, precautions do apply. Overdosage: If you think you have taken too much of this medicine contact a poison control center or emergency room at once. NOTE: This medicine is only for you. Do not share this medicine with others. What if I miss a dose? If you get this medication at the hospital or clinic: It is important not to miss your dose. Call your care team if you are unable to keep an appointment. If you give yourself this medication at home: If you miss a dose, take it as soon as you can. Then continue your normal schedule. If it is almost time for your next dose, take only that dose. Do not take double or extra doses. Call your care team with questions. What may interact with this medication? Live virus vaccines This list may not describe all possible interactions. Give your health care provider a list of all the medicines, herbs, non-prescription drugs, or dietary supplements you use. Also tell them if you smoke, drink alcohol, or use illegal drugs. Some items may interact with your medicine. What should I watch for while using this medication? Your condition will be monitored carefully while you are receiving this medication. Tell your care team if your symptoms do not start to get better or if they get worse. You may need blood work done while you are taking this medication. This medication increases the risk of blood clots. People with heart, blood vessel, or blood clotting conditions are more likely to develop a blood clot. Other risk factors include advanced age, estrogen  use, tobacco use, lack of movement, and being overweight. This medication can decrease the response to a vaccine. If you need to get vaccinated, tell your care team if you have received this medication within the last year. Extra booster doses may be needed. Talk to your care team to see if a different  vaccination schedule is needed. If you have diabetes, you may get a falsely elevated blood sugar reading. Talk to your care team about how to check your blood sugar while taking this medication. What side effects may I notice from receiving this medication? Side effects that you should report to your care team as soon as possible: Allergic reactions--skin rash, itching, hives, swelling of the face, lips, tongue, or throat Blood clot--pain, swelling, or warmth in the leg, shortness of breath, chest pain Fever, neck pain or stiffness, sensitivity to light, headache, nausea, vomiting, confusion, which may be signs of meningitis Hemolytic anemia--unusual weakness or fatigue, dizziness, headache, trouble breathing, dark urine, yellowing skin or eyes Kidney injury--decrease in the amount of urine, swelling of the ankles, hands, or feet Low sodium level--muscle weakness, fatigue, dizziness, headache, confusion Shortness of breath or trouble breathing, cough, unusual weakness or fatigue, blue skin or lips Side effects that usually do not require medical attention (report these to your care team if they continue or are bothersome): Chills Diarrhea Fever Headache Nausea This list may not describe all possible side effects. Call your doctor for medical advice about side effects. You may report side effects to FDA at 1-800-FDA-1088. Where should I keep my medication? Keep out of the reach of children and pets. You will be instructed on how to store this medication. Get rid of any unused medication after the expiration date. To get rid of medications that are no longer needed or have expired: Take the medication to a medication take-back program. Check with your pharmacy or law enforcement to find a location. If you cannot return the medication, ask your pharmacist or care team how to get rid of this medication safely. NOTE: This sheet is a summary. It may not cover all possible information. If you have  questions about this medicine, talk to your doctor, pharmacist, or health care provider.  2024 Elsevier/Gold Standard (2023-06-13 00:00:00)

## 2023-12-19 ENCOUNTER — Inpatient Hospital Stay

## 2023-12-19 VITALS — BP 109/72 | HR 67 | Temp 97.5°F | Resp 18

## 2023-12-19 DIAGNOSIS — D509 Iron deficiency anemia, unspecified: Secondary | ICD-10-CM

## 2023-12-19 DIAGNOSIS — C50912 Malignant neoplasm of unspecified site of left female breast: Secondary | ICD-10-CM | POA: Diagnosis not present

## 2023-12-19 DIAGNOSIS — D8989 Other specified disorders involving the immune mechanism, not elsewhere classified: Secondary | ICD-10-CM | POA: Diagnosis not present

## 2023-12-19 DIAGNOSIS — M349 Systemic sclerosis, unspecified: Secondary | ICD-10-CM

## 2023-12-19 DIAGNOSIS — Z170 Estrogen receptor positive status [ER+]: Secondary | ICD-10-CM | POA: Diagnosis not present

## 2023-12-19 DIAGNOSIS — Z79899 Other long term (current) drug therapy: Secondary | ICD-10-CM | POA: Diagnosis not present

## 2023-12-19 DIAGNOSIS — R059 Cough, unspecified: Secondary | ICD-10-CM | POA: Diagnosis not present

## 2023-12-19 MED ORDER — DIPHENHYDRAMINE HCL 25 MG PO CAPS: 25.0000 mg | ORAL_CAPSULE | Freq: Once | ORAL | Status: DC

## 2023-12-19 MED ORDER — DEXTROSE 5 % IV SOLN: INTRAVENOUS | Status: DC

## 2023-12-19 MED ORDER — IMMUNE GLOBULIN (HUMAN) 20 GM/200ML IV SOLN
25.0000 g | Freq: Once | INTRAVENOUS | Status: AC
  Administered 2023-12-19: 25 g via INTRAVENOUS
  Filled 2023-12-19: qty 200

## 2023-12-19 MED ORDER — SODIUM CHLORIDE 0.9% FLUSH
10.0000 mL | Freq: Once | INTRAVENOUS | Status: AC
  Administered 2023-12-19: 10 mL via INTRAVENOUS

## 2023-12-19 MED ORDER — ACETAMINOPHEN 325 MG PO TABS: 650.0000 mg | ORAL_TABLET | Freq: Once | ORAL | Status: DC

## 2023-12-19 MED ORDER — HEPARIN SOD (PORK) LOCK FLUSH 100 UNIT/ML IV SOLN
500.0000 [IU] | Freq: Once | INTRAVENOUS | Status: AC
  Administered 2023-12-19: 500 [IU] via INTRAVENOUS

## 2023-12-22 ENCOUNTER — Other Ambulatory Visit

## 2023-12-22 ENCOUNTER — Ambulatory Visit: Admitting: Hematology & Oncology

## 2023-12-22 ENCOUNTER — Inpatient Hospital Stay

## 2023-12-22 ENCOUNTER — Ambulatory Visit

## 2023-12-23 ENCOUNTER — Inpatient Hospital Stay

## 2023-12-24 ENCOUNTER — Inpatient Hospital Stay

## 2023-12-25 ENCOUNTER — Inpatient Hospital Stay

## 2023-12-25 DIAGNOSIS — R040 Epistaxis: Secondary | ICD-10-CM | POA: Diagnosis not present

## 2023-12-26 ENCOUNTER — Inpatient Hospital Stay

## 2024-01-07 NOTE — Progress Notes (Signed)
 The IVIG is not for iron deficiency anemia.  It is secondary to her scleroderma.  And, it is working very nicely.  Thanks.SABRA

## 2024-01-07 NOTE — Progress Notes (Signed)
 Again, she has scleroderma kidney disease.  The Aranesp  is because of her renal insufficiency and her low erythropoietin  level.  Thank you.  I will pray about this.  Jeralyn

## 2024-01-19 DIAGNOSIS — I129 Hypertensive chronic kidney disease with stage 1 through stage 4 chronic kidney disease, or unspecified chronic kidney disease: Secondary | ICD-10-CM | POA: Diagnosis not present

## 2024-01-19 DIAGNOSIS — N280 Ischemia and infarction of kidney: Secondary | ICD-10-CM | POA: Diagnosis not present

## 2024-01-19 DIAGNOSIS — E876 Hypokalemia: Secondary | ICD-10-CM | POA: Diagnosis not present

## 2024-01-19 DIAGNOSIS — N1831 Chronic kidney disease, stage 3a: Secondary | ICD-10-CM | POA: Diagnosis not present

## 2024-01-19 DIAGNOSIS — D631 Anemia in chronic kidney disease: Secondary | ICD-10-CM | POA: Diagnosis not present

## 2024-01-19 DIAGNOSIS — M349 Systemic sclerosis, unspecified: Secondary | ICD-10-CM | POA: Diagnosis not present

## 2024-01-26 ENCOUNTER — Other Ambulatory Visit: Payer: Self-pay

## 2024-01-26 ENCOUNTER — Encounter: Payer: Self-pay | Admitting: Internal Medicine

## 2024-01-26 MED ORDER — VOQUEZNA 10 MG PO TABS: 1.0000 | ORAL_TABLET | Freq: Every day | ORAL | 1 refills | Status: DC

## 2024-01-28 ENCOUNTER — Ambulatory Visit: Admitting: Family Medicine

## 2024-01-29 NOTE — Progress Notes (Unsigned)
 [name] at [facility] [address] [place], [place] [address] [phone]  Date:  02/02/2024   Name:  [name]   DOB:  Dec 17, 1950   MRN:  [id number]  PCP:  Jennifer Harlene BROCKS, MD    Chief Complaint: No chief complaint on file.   History of Present Illness:  [name] is a 73 y.o. very pleasant female patient who presents with the following:  Patient seen today with concern of periodic recheck Last seen by myself about 15 months ago although we have exchanged several MyChart messages since that time  She has a distant history of left breast cancer (dx 2013)and scleroderma-she is managed both locally and at [name] She was dx with scleroderma about 10 years ago- originally due to hand sx. until recently her scleroderma and otherwise been relatively benign.  She was on CellCept until about a year ago when this was discontinued due to a shingles outbreak.  We think this may have been when her scleroderma became more malignant   Dr [name] at [name] is her local rheumatologist Another Dr [name]- [name] at [name] is her point person at [name]   Dr [name] is her local hematologist  Dr [name] is following her breast imaging here- she is doing periodic MRI as they are trying to avoid further radiation to her chest   She was seen by Dr. 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  She looks quite good.  She feels okay.  Unfortunately, looks like she may be having some issues with the IVIG.  She says that she has nosebleeds after she gets IVIG.  She also  says that she has a bad cough after the IVIG.  I am a says I am not heard of this before.  However, I am sure that there are probably reports of epistaxis and cough. She actually was seen by her rheumatologist at [name] and also by the rheumatologist up at [name].  They also were not sure as to why she had these symptoms. Sounds like going to have to refer her to ENT.  I will see if [name] will be able to see her. She is little more anemic.  We have to make sure that she gets Aranesp . Her swallowing is doing a lot better. Her aldolase has been coming down nicely.  Is 4.2.  Her total CK is 80. We also been watching her CA 27.29.  The last level was 49.  She did see [name] with ENT in June- Assessment & Plan 1. Epistaxis. The epistaxis is likely attributable to scleroderma, which has resulted in dryness of the nasal mucosa. This dryness leads to cracking and subsequent bleeding, initiating a cycle of crusting, bleeding, and crust removal. The goal is to manage this condition with medication, avoiding cautery if possible. Saline spray should be used 5 to 6 times daily. A prescription for Bactroban ointment will be provided,  to be applied twice daily for 2 weeks. Avoid manipulating the nose during this period. Post-treatment, transition to using saline nasal gel as needed. If there is no improvement after 2 months, contact us .  She saw her rheumatologist at [name] in December 2024 (she also saw her rheumatologist at [name] in March but it does not look like they made any changes) Assessment and Plan per [name] is a 73 y.o. 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 Doing remarkably well (and better) considering the above with continued improvement in strength, mRSS. Her echo parameters have also improved and PFTs are stable to improved. She is UTD on  cancer screening  mRSS is improved Strength is stable from last visit and while she can rise from a sitting position she still has mild weakness in hip flexors.  Troponins were mildly elevated but cardiac MRI in August without myocarditis [name] - creatinine continues to improve. Remains on ARM - For Raynaud's, continue monitoring symptoms and consider adjustments if necessary. Will treat symptomatically - Continuation of CellCept is advised at current dose of 1500mg  BID for skin and lung disease  - Labs are being conducted before each IVIG treatment including CBC and GI panel for drug toxicity  - The IVIG regimen will be adjusted to every 6 weeks for 3 doses, then every 8 weeks for 3 doses. Dr. Timmy office will be contacted to coordinate this change. We will follow CK levels with infusions and if rise, we may have to reduce frequency - Continue current management of hip pain and monitor for any changes in symptoms. - this is likely OA - An upper endoscopy and colonoscopy will be scheduled to rule out Barrett's esophagus and other GI diseases. The patient is due for these evaluations and will follow up with Dr. Stoney for further assessment. - She remains on an ARB given history of SRC (intolerance to ACEi)  Patient Active Problem List   Diagnosis Date Noted   Chronic renal failure (CRF), stage 3a ([name]) 11/18/2022   IDA (iron deficiency anemia) 09/09/2022   Pericardial effusion 08/10/2022   Hypokalemia 08/09/2022   Hypocalcemia 08/09/2022   Hypothyroidism 08/09/2022   AKI (acute kidney injury) ([name]) 08/09/2022   Possible community acquired pneumonia vs CHF 08/09/2022   Acute scleroderma renal crisis ([name]) 08/09/2022   Dysphagia 08/06/2022   Scleroderma ([name]) 08/06/2022   Sjogren's syndrome ([name]) 02/14/2021   History of UTI 02/05/2021   Hyperlipidemia    History of colonic polyps    Erythropoietin  deficiency anemia    Acute esophagitis    Hypertension 09/09/2017   High risk medication  use 05/08/2017   Raynaud's disease without gangrene 05/08/2017   Diffuse systemic sclerosis ([name]) 05/08/2017   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   Abnormal renal function test 10/31/2016   Mass of multiple sites of right breast 12/27/2013   Osteopenia 12/27/2013   Postmenopausal 12/27/2013   Malignant neoplasm of upper-outer quadrant of left breast in female, estrogen receptor positive ([name]) 06/21/2013   Dense breasts 12/14/2012   Osteoarthritis     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, ER/PR+, HER2 negative -- 11-04-2011  left partial mastectomy w/ sln dissection-- completed  radiation 01-08-2012-- started tamoxifen  07/ 2013-- per lov note 11/ 2018 no recurrence   Chronic renal failure (CRF), stage 3a ([name]) 11/18/2022   Colon polyp    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   Depression    Esophageal stricture    GERD (gastroesophageal reflux disease)    Hiatal hernia    History of external beam radiation therapy 11-25-2011 to 01-08-2012   left breast 45 Gy at 1.8 per fraction x25 fractions, boost 16 Gy at 2 per fraction x8 fractions   Hyperlipidemia    Hypertension    Hypothyroidism    Internal hemorrhoids    Osteoarthritis    Osteopenia    Positive ANA (antinuclear antibody)    Rash    arms and legs caused by skin scleroderma   Raynaud's syndrome    Scleroderma involving lung ([name]) pulmologist-  dr h. layman at [facility]   systemic sclerosis , diffuse/   rheumotologist:  dr maree at [name]---  lov 07-22-2017 (as of 07-25-2017 note not available to read)---  per pt has appt. w/ specialist at [facility]   Skin thickening    hands, arms, legs  caused by scleroderma   Systemic sclerosis ([name])    UTI (urinary tract infection)     Past Surgical History:  Procedure Laterality Date    BIOPSY  01/29/2018   Procedure: BIOPSY;  Surgeon: Albertus Gordy HERO, MD;  Location: [facility];  Service: Gastroenterology;;   ROMAYNE  2000   Left   COLONOSCOPY WITH PROPOFOL  N/A 01/29/2018   Procedure: COLONOSCOPY WITH PROPOFOL ;  Surgeon: Albertus Gordy HERO, MD;  Location: [facility];  Service: Gastroenterology;  Laterality: N/A;   DILATATION & CURETTAGE/HYSTEROSCOPY WITH MYOSURE N/A 08/04/2017   Procedure: DILATATION & CURETTAGE/HYSTEROSCOPY WITH MYOSURE;  Surgeon: Cleotilde Ronal RAMAN, MD;  Location: [facility];  Service: Gynecology;  Laterality: N/A;   ECTOPIC PREGNANCY [name]  1986-88   s/p unilateral salpingectomy   ESOPHAGOGASTRODUODENOSCOPY (EGD) WITH PROPOFOL  N/A 01/29/2018   Procedure: ESOPHAGOGASTRODUODENOSCOPY (EGD) WITH PROPOFOL ;  Surgeon: Albertus Gordy HERO, MD;  Location: [facility];  Service: Gastroenterology;  Laterality: N/A;   IR IMAGING GUIDED PORT INSERTION  12/02/2022   PARTIAL MASTECTOMY WITH AXILLARY SENTINEL LYMPH NODE BIOPSY Left 11-04-2011   dr merrilyn  [facility]   RIGHT HEART CATH N/A 10/01/2022   Procedure: RIGHT HEART CATH;  Surgeon: Cherrie Toribio SAUNDERS, MD;  Location: [name];  Service: Cardiovascular;  Laterality: N/A;   TRANSTHORACIC ECHOCARDIOGRAM  06-04-2017    [name]   moderate LVH, ef>55%/  trivial AR and TR/ mild MR and PR/  trivial pericardial effusion   WISDOM TOOTH EXTRACTION      Social History   Tobacco Use   Smoking status: Never   Smokeless tobacco: Never  Vaping Use   Vaping status: Never Used  Substance Use Topics   Alcohol  use: Yes    Comment: socially   Drug use: No    Family History  Problem Relation Age of Onset   Heart disease Mother    Heart failure Mother    Heart disease Father    Ovarian cancer Maternal Aunt    Heart disease Paternal Uncle        multiple   Autoimmune disease Sister    Arrhythmia Sister    Anemia Sister    CAD Brother    Colon cancer Neg Hx    Stomach cancer Neg Hx    Esophageal cancer Neg  Hx  Rectal cancer Neg Hx     Allergies  Allergen Reactions   Betadine [Povidone Iodine] Rash   Epinephrine Other (See Comments)    Severe headaches    Iodinated Contrast Media Other (See Comments) and Rash    per pt: recently had CT 08/ 2018 even through given pre-medication, IVP still caused rash  per pt recently had CT 08/ 2018 even through given pre-medication, IVP still caused rash   Iodine Rash and Other (See Comments)   Oxycodone  Anxiety   Prednisone  Anaphylaxis    Per Nephrology pt should avoid this medication because she has Scleroderma and it will interfere with her Kidneys,    Gabapentin Other (See Comments)    Dizziness   Lidocaine  Rash   Nickel Rash and Other (See Comments)    Rash and blisters   Other Other (See Comments) and Rash    Hospital gown  Product containing penicillin (product)  Substance with sulfonamide structure and antibacterial mechanism of action (substance)   Penicillins Rash and Other (See Comments)    Rash and blisters Has patient had a PCN reaction causing immediate rash, facial/tongue/throat swelling, SOB or lightheadedness with hypotension: Yes Has patient had a PCN reaction causing severe rash involving mucus membranes or skin necrosis: No Has patient had a PCN reaction that required hospitalization: No Has patient had a PCN reaction occurring within the last 10 years: No If all of the above answers are NO, then may proceed with Cephalosporin use.    Sulfa Antibiotics Rash    Medication list has been reviewed and updated.  Current Outpatient Medications on File Prior to Visit  Medication Sig Dispense Refill   acetaminophen  (TYLENOL ) 650 MG CR tablet Take 650 mg by mouth daily as needed for pain.     benzonatate  (TESSALON ) 100 MG capsule Take 1 capsule (100 mg total) by mouth 3 (three) times daily as needed for cough. 30 capsule 3   diphenhydrAMINE  (BENADRYL ) 25 mg capsule Take 25 mg by mouth every 6 (six) hours as needed for  allergies. Allergies and before IVIG treatment.     ELDERBERRY PO Take 1 oz by mouth 4 (four) times daily as needed. Liquid     furosemide  (LASIX ) 20 MG tablet Take 1 tablet (20 mg total) by mouth daily. 90 tablet 0   losartan  (COZAAR ) 25 MG tablet Take 25 mg by mouth in the morning and at bedtime.     Multiple Vitamin (MULTIVITAMIN) capsule Take 1 capsule by mouth daily.     mycophenolate (CELLCEPT) 500 MG tablet Take 1,500 mg by mouth 2 (two) times daily.     sodium chloride  (OCEAN) 0.65 % SOLN nasal spray Place 1 spray into both nostrils as needed for congestion.     SYNTHROID  100 MCG tablet Take 1 tablet (100 mcg total) by mouth daily before breakfast. 90 tablet 3   Vonoprazan Fumarate  (VOQUEZNA ) 10 MG TABS Take 1 tablet by mouth daily. 30 tablet 1   No current facility-administered medications on file prior to visit.    Review of Systems:  As per HPI- otherwise negative.   Physical Examination: There were no vitals filed for this visit. There were no vitals filed for this visit. There is no height or weight on file to calculate BMI. Ideal Body Weight:    GEN: no acute distress. HEENT: Atraumatic, Normocephalic.  Ears and Nose: No external deformity. CV: RRR, No M/G/R. No JVD. No thrill. No extra heart sounds. PULM: CTA B, no wheezes, crackles, rhonchi. No retractions.  No resp. distress. No accessory muscle use. ABD: S, NT, ND, +BS. No rebound. No HSM. EXTR: No c/c/e PSYCH: Normally interactive. Conversant.    Assessment and Plan: ***  Signed Harlene Schroeder, MD

## 2024-01-31 NOTE — Patient Instructions (Incomplete)
 It was great to see you again today Hang in there- I hope you have a wonderful trip to [place]

## 2024-02-02 ENCOUNTER — Ambulatory Visit (INDEPENDENT_AMBULATORY_CARE_PROVIDER_SITE_OTHER): Admitting: Family Medicine

## 2024-02-02 ENCOUNTER — Encounter: Payer: Self-pay | Admitting: Family Medicine

## 2024-02-02 VITALS — BP 136/64 | HR 70 | Ht 64.0 in | Wt 135.8 lb

## 2024-02-02 DIAGNOSIS — M349 Systemic sclerosis, unspecified: Secondary | ICD-10-CM

## 2024-02-02 DIAGNOSIS — Z853 Personal history of malignant neoplasm of breast: Secondary | ICD-10-CM

## 2024-02-02 DIAGNOSIS — M3489 Other systemic sclerosis: Secondary | ICD-10-CM

## 2024-02-02 DIAGNOSIS — E039 Hypothyroidism, unspecified: Secondary | ICD-10-CM | POA: Diagnosis not present

## 2024-02-04 ENCOUNTER — Inpatient Hospital Stay: Attending: Hematology & Oncology

## 2024-02-04 VITALS — BP 117/59 | HR 20 | Temp 97.5°F | Resp 20

## 2024-02-04 DIAGNOSIS — Z853 Personal history of malignant neoplasm of breast: Secondary | ICD-10-CM | POA: Diagnosis not present

## 2024-02-04 DIAGNOSIS — M349 Systemic sclerosis, unspecified: Secondary | ICD-10-CM

## 2024-02-04 DIAGNOSIS — Z452 Encounter for adjustment and management of vascular access device: Secondary | ICD-10-CM | POA: Diagnosis not present

## 2024-02-04 MED ORDER — SODIUM CHLORIDE 0.9% FLUSH
10.0000 mL | INTRAVENOUS | Status: DC | PRN
  Administered 2024-02-04: 10 mL via INTRAVENOUS

## 2024-02-04 MED ORDER — HEPARIN SOD (PORK) LOCK FLUSH 100 UNIT/ML IV SOLN
500.0000 [IU] | Freq: Once | INTRAVENOUS | Status: AC
  Administered 2024-02-04: 500 [IU] via INTRAVENOUS

## 2024-02-04 NOTE — Patient Instructions (Signed)

## 2024-02-23 ENCOUNTER — Inpatient Hospital Stay

## 2024-02-23 ENCOUNTER — Inpatient Hospital Stay: Attending: Hematology & Oncology

## 2024-02-23 ENCOUNTER — Inpatient Hospital Stay (HOSPITAL_BASED_OUTPATIENT_CLINIC_OR_DEPARTMENT_OTHER): Admitting: Hematology & Oncology

## 2024-02-23 ENCOUNTER — Ambulatory Visit: Payer: Self-pay | Admitting: Hematology & Oncology

## 2024-02-23 VITALS — BP 127/58 | HR 80 | Temp 97.9°F | Resp 17

## 2024-02-23 VITALS — BP 91/44 | HR 79 | Resp 17

## 2024-02-23 DIAGNOSIS — M349 Systemic sclerosis, unspecified: Secondary | ICD-10-CM

## 2024-02-23 DIAGNOSIS — Z1722 Progesterone receptor negative status: Secondary | ICD-10-CM | POA: Insufficient documentation

## 2024-02-23 DIAGNOSIS — Z923 Personal history of irradiation: Secondary | ICD-10-CM | POA: Diagnosis not present

## 2024-02-23 DIAGNOSIS — C50912 Malignant neoplasm of unspecified site of left female breast: Secondary | ICD-10-CM | POA: Insufficient documentation

## 2024-02-23 DIAGNOSIS — Z79890 Hormone replacement therapy (postmenopausal): Secondary | ICD-10-CM | POA: Insufficient documentation

## 2024-02-23 DIAGNOSIS — D509 Iron deficiency anemia, unspecified: Secondary | ICD-10-CM

## 2024-02-23 DIAGNOSIS — Z170 Estrogen receptor positive status [ER+]: Secondary | ICD-10-CM | POA: Diagnosis not present

## 2024-02-23 DIAGNOSIS — M3489 Other systemic sclerosis: Secondary | ICD-10-CM | POA: Diagnosis not present

## 2024-02-23 DIAGNOSIS — M3500 Sicca syndrome, unspecified: Secondary | ICD-10-CM | POA: Insufficient documentation

## 2024-02-23 DIAGNOSIS — Z79624 Long term (current) use of inhibitors of nucleotide synthesis: Secondary | ICD-10-CM | POA: Diagnosis not present

## 2024-02-23 DIAGNOSIS — Z79899 Other long term (current) drug therapy: Secondary | ICD-10-CM | POA: Diagnosis not present

## 2024-02-23 DIAGNOSIS — R790 Abnormal level of blood mineral: Secondary | ICD-10-CM

## 2024-02-23 DIAGNOSIS — D631 Anemia in chronic kidney disease: Secondary | ICD-10-CM | POA: Insufficient documentation

## 2024-02-23 DIAGNOSIS — D649 Anemia, unspecified: Secondary | ICD-10-CM | POA: Insufficient documentation

## 2024-02-23 DIAGNOSIS — N1831 Chronic kidney disease, stage 3a: Secondary | ICD-10-CM | POA: Diagnosis not present

## 2024-02-23 LAB — CBC WITH DIFFERENTIAL (CANCER CENTER ONLY)
Abs Immature Granulocytes: 0.06 K/uL (ref 0.00–0.07)
Basophils Absolute: 0 K/uL (ref 0.0–0.1)
Basophils Relative: 1 %
Eosinophils Absolute: 0.1 K/uL (ref 0.0–0.5)
Eosinophils Relative: 2 %
HCT: 29.2 % — ABNORMAL LOW (ref 36.0–46.0)
Hemoglobin: 9.4 g/dL — ABNORMAL LOW (ref 12.0–15.0)
Immature Granulocytes: 1 %
Lymphocytes Relative: 9 %
Lymphs Abs: 0.6 K/uL — ABNORMAL LOW (ref 0.7–4.0)
MCH: 29.7 pg (ref 26.0–34.0)
MCHC: 32.2 g/dL (ref 30.0–36.0)
MCV: 92.1 fL (ref 80.0–100.0)
Monocytes Absolute: 0.5 K/uL (ref 0.1–1.0)
Monocytes Relative: 7 %
Neutro Abs: 6.1 K/uL (ref 1.7–7.7)
Neutrophils Relative %: 80 %
Platelet Count: 243 K/uL (ref 150–400)
RBC: 3.17 MIL/uL — ABNORMAL LOW (ref 3.87–5.11)
RDW: 13.3 % (ref 11.5–15.5)
WBC Count: 7.5 K/uL (ref 4.0–10.5)
nRBC: 0 % (ref 0.0–0.2)

## 2024-02-23 LAB — CMP (CANCER CENTER ONLY)
ALT: 12 U/L (ref 0–44)
AST: 22 U/L (ref 15–41)
Albumin: 4.2 g/dL (ref 3.5–5.0)
Alkaline Phosphatase: 68 U/L (ref 38–126)
Anion gap: 13 (ref 5–15)
BUN: 16 mg/dL (ref 8–23)
CO2: 19 mmol/L — ABNORMAL LOW (ref 22–32)
Calcium: 9.4 mg/dL (ref 8.9–10.3)
Chloride: 103 mmol/L (ref 98–111)
Creatinine: 1.11 mg/dL — ABNORMAL HIGH (ref 0.44–1.00)
GFR, Estimated: 52 mL/min — ABNORMAL LOW (ref >60)
Glucose, Bld: 106 mg/dL — ABNORMAL HIGH (ref 70–99)
Potassium: 3.6 mmol/L (ref 3.5–5.1)
Sodium: 135 mmol/L (ref 135–145)
Total Bilirubin: 0.3 mg/dL (ref 0.0–1.2)
Total Protein: 6.7 g/dL (ref 6.5–8.1)

## 2024-02-23 LAB — IRON AND IRON BINDING CAPACITY (CC-WL,HP ONLY)
Iron: 97 ug/dL (ref 28–170)
Saturation Ratios: 29 % (ref 10.4–31.8)
TIBC: 335 ug/dL (ref 250–450)
UIBC: 238 ug/dL

## 2024-02-23 LAB — RETICULOCYTES
Immature Retic Fract: 7.9 % (ref 2.3–15.9)
RBC.: 3.15 MIL/uL — ABNORMAL LOW (ref 3.87–5.11)
Retic Count, Absolute: 52.6 K/uL (ref 19.0–186.0)
Retic Ct Pct: 1.7 % (ref 0.4–3.1)

## 2024-02-23 LAB — C-REACTIVE PROTEIN: CRP: <0.5 mg/dL (ref <1.0)

## 2024-02-23 LAB — CK: Total CK: 63 U/L (ref 38–234)

## 2024-02-23 LAB — FERRITIN: Ferritin: 115 ng/mL (ref 11–307)

## 2024-02-23 LAB — LACTATE DEHYDROGENASE: LDH: 192 U/L (ref 98–192)

## 2024-02-23 MED ORDER — DEXTROSE 5 % IV SOLN: INTRAVENOUS | Status: DC

## 2024-02-23 MED ORDER — IMMUNE GLOBULIN (HUMAN) 20 GM/200ML IV SOLN
25.0000 g | Freq: Once | INTRAVENOUS | Status: AC
  Administered 2024-02-23 (×2): 25 g via INTRAVENOUS
  Filled 2024-02-23: qty 200

## 2024-02-23 MED ORDER — ACETAMINOPHEN 325 MG PO TABS: 650.0000 mg | ORAL_TABLET | Freq: Once | ORAL | Status: DC

## 2024-02-23 MED ORDER — DARBEPOETIN ALFA 300 MCG/0.6ML IJ SOSY
300.0000 ug | PREFILLED_SYRINGE | Freq: Once | INTRAMUSCULAR | Status: AC
  Administered 2024-02-23 (×2): 300 ug via SUBCUTANEOUS
  Filled 2024-02-23: qty 0.6

## 2024-02-23 MED ORDER — DIPHENHYDRAMINE HCL 25 MG PO CAPS: 25.0000 mg | ORAL_CAPSULE | Freq: Once | ORAL | Status: DC

## 2024-02-23 NOTE — Progress Notes (Signed)
 Hematology and Oncology Follow Up Visit  [name] [id number] [birth year] 73 y.o. 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  She looks quite good.  She feels okay.  Again, I know she has had some issues with the IVIG.  Apparently, after about 4 weeks after IVIG she just does not feel good.  After that, then she begins to feel better.  She is under a lot of stress.  There are some issues going on with her family.  I know this has been quite difficult for her.  I know that is because a lot of of anxiety.  Hopefully, things are starting to work themselves out.  I do not think she sees her rheumatologist at [facility] until November.  She has had no fever.  She has had no rashes.  She has had no cough or shortness of breath.  She had no obvious change in bowel or bladder habits.  However, she says that her bowels do feel more loose.  Again she thinks this might be from stress.  She says that swallowing is doing well.  She [name] seems to be under very good control.  She has had no bleeding.  In the past, she has had some nosebleeds.  She has had no swollen lymph nodes.  Last CRP was 0.6.  Her last aldolase was 4.2.  Her last IgG level was 1078 mg/dL.  She and her family have been on vacation.  That she went down to [place].  Had a good time in [place].  After [place], they went to [facility].   Currently, up to that her performance status is ECOG 1.   Medications:  Current Outpatient Medications:    acetaminophen  (TYLENOL ) 650 MG CR tablet, Take 650 mg by mouth daily as needed for pain., Disp: , Rfl:    benzonatate  (TESSALON ) 100 MG capsule, Take 1 capsule (100 mg  total) by mouth 3 (three) times daily as needed for cough., Disp: 30 capsule, Rfl: 3   diphenhydrAMINE  (BENADRYL ) 25 mg capsule, Take 25 mg by mouth every 6 (six) hours as needed for allergies. Allergies and before IVIG treatment., Disp: , Rfl:    ELDERBERRY PO, Take 1 oz by mouth 4 (four) times daily as needed. Liquid, Disp: , Rfl:    furosemide  (LASIX ) 20 MG tablet, Take 1 tablet (20 mg total) by mouth daily., Disp: 90 tablet, Rfl: 0   losartan  (COZAAR ) 25 MG tablet, Take 25 mg by mouth in the morning and at bedtime., Disp: , Rfl:    Multiple Vitamin (MULTIVITAMIN) capsule, Take 1 capsule by mouth daily., Disp: , Rfl:    mycophenolate (CELLCEPT) 500 MG tablet, Take 1,500 mg by mouth 2 (two) times daily., Disp: , Rfl:    sodium chloride  (OCEAN) 0.65 % SOLN nasal spray, Place 1 spray into both nostrils as needed for congestion., Disp: , Rfl:    SYNTHROID  100 MCG tablet, Take 1 tablet (100 mcg total) by mouth daily before breakfast., Disp: 90 tablet, Rfl: 3   Vonoprazan Fumarate  (VOQUEZNA ) 10 MG TABS, Take 1 tablet by mouth daily., Disp: 30 tablet, Rfl: 1  Allergies:  Allergies  Allergen Reactions   Betadine [Povidone Iodine] Rash  Epinephrine Other (See Comments)    Severe headaches    Iodinated Contrast Media Other (See Comments) and Rash    per pt: recently had CT 08/ 2018 even through given pre-medication, IVP still caused rash  per pt recently had CT 08/ 2018 even through given pre-medication, IVP still caused rash   Iodine Rash and Other (See Comments)   Oxycodone  Anxiety   Prednisone  Anaphylaxis    Per Nephrology pt should avoid this medication because she has Scleroderma and it will interfere with her Kidneys,    Gabapentin Other (See Comments)    Dizziness   Lidocaine  Rash   Nickel Rash and Other (See Comments)    Rash and blisters   Other Other (See Comments) and Rash    Hospital gown  Product containing penicillin (product)  Substance with sulfonamide structure and  antibacterial mechanism of action (substance)   Penicillins Rash and Other (See Comments)    Rash and blisters Has patient had a PCN reaction causing immediate rash, facial/tongue/throat swelling, SOB or lightheadedness with hypotension: Yes Has patient had a PCN reaction causing severe rash involving mucus membranes or skin necrosis: No Has patient had a PCN reaction that required hospitalization: No Has patient had a PCN reaction occurring within the last 10 years: No If all of the above answers are NO, then may proceed with Cephalosporin use.    Sulfa Antibiotics Rash    Past Medical History, Surgical history, Social history, and Family History were reviewed and updated.  Review of Systems: Review of Systems  Constitutional:  Positive for fatigue.  HENT:  Negative.    Eyes: Negative.   Respiratory: Negative.    Cardiovascular: Negative.   Gastrointestinal: Negative.   Endocrine: Negative.   Genitourinary: Negative.    Musculoskeletal:  Positive for arthralgias and myalgias.  Skin:  Positive for rash.  Neurological: Negative.   Hematological: Negative.   Psychiatric/Behavioral: Negative.      Physical Exam: Temperature is 97.9.  Pulse 80.  Blood pressure 127/58.  Weight is 134 pounds.      Wt Readings from Last 3 Encounters:  02/02/24 135 lb 12.8 oz (61.6 kg)  12/15/23 136 lb (61.7 kg)  12/12/23 137 lb 3.2 oz (62.2 kg)    Physical Exam Vitals reviewed.  Constitutional:      Comments: Her breast exam shows right breast with no masses, edema or erythema.  There is no right axillary adenopathy.  Left breast shows the lumpectomy scar at about the 2 o'clock position.  There is little firmness at the lumpectomy site.  No distinct masses noted.  There is no left axillary adenopathy.  There is no left nipple discharge.  HENT:     Head: Normocephalic and atraumatic.  Eyes:     Pupils: Pupils are equal, round, and reactive to light.  Cardiovascular:     Rate and Rhythm:  Normal rate and regular rhythm.     Heart sounds: Normal heart sounds.  Pulmonary:     Effort: Pulmonary effort is normal.     Breath sounds: Normal breath sounds.  Abdominal:     General: Bowel sounds are normal.     Palpations: Abdomen is soft.  Musculoskeletal:        General: No tenderness or deformity. Normal range of motion.     Cervical back: Normal range of motion.  Lymphadenopathy:     Cervical: No cervical adenopathy.  Skin:    General: Skin is warm and dry.     Findings: No  erythema or rash.     Comments: Skin exam shows some changes of the scleroderma.  This is mostly on the face, around the lip area.  Her arms do show a little bit of skin thickening.  Neurological:     Mental Status: She is alert and oriented to person, place, and time.  Psychiatric:        Behavior: Behavior normal.        Thought Content: Thought content normal.        Judgment: Judgment normal.      Lab Results  Component Value Date   WBC 7.5 02/23/2024   HGB 9.4 (L) 02/23/2024   HCT 29.2 (L) 02/23/2024   MCV 92.1 02/23/2024   PLT 243 02/23/2024     Chemistry      Component Value Date/Time   NA 135 12/15/2023 0840   NA 144 01/27/2020 1148   NA 141 06/09/2017 1117   K 3.5 12/15/2023 0840   K 3.9 06/09/2017 1117   CL 104 12/15/2023 0840   CL 109 (H) 12/14/2012 1253   CO2 23 12/15/2023 0840   CO2 23 06/09/2017 1117   BUN 18 12/15/2023 0840   BUN 11 01/27/2020 1148   BUN 11.5 06/09/2017 1117   CREATININE 1.11 (H) 12/15/2023 0840   CREATININE 1.2 (H) 06/09/2017 1117      Component Value Date/Time   CALCIUM  8.9 12/15/2023 0840   CALCIUM  8.9 06/09/2017 1117   ALKPHOS 58 12/15/2023 0840   ALKPHOS 40 06/09/2017 1117   AST 19 12/15/2023 0840   AST 35 (H) 06/09/2017 1117   ALT 8 12/15/2023 0840   ALT 19 06/09/2017 1117   BILITOT 0.3 12/15/2023 0840   BILITOT 0.37 06/09/2017 1117      Impression and Plan: Ms. Madewell is a very charming 74 year old white female.  She has had a  history of early stage breast cancer.   Overall, I think everything is going pretty well with her.  She has a good quality of life.  The Scleroderma does not seem to be active which is nice to see.     I think that we have moved her IVIG appointments out a little bit longer.  She like to have some lab work done in about 2-3 weeks to see if everything is okay.  We can set this up.  I think her next IVIG infusion is on 05/10/2024.    Maude JONELLE Crease, MD 8/11/20259:54 AM

## 2024-02-23 NOTE — Patient Instructions (Signed)
 Immune Globulin  Injection What is this medication? IMMUNE GLOBULIN  (im MUNE GLOB yoo lin) treats many immune system conditions. It works by Designer, multimedia extra antibodies. Antibodies are proteins made by the immune system that help protect the body. This medicine may be used for other purposes; ask your health care provider or pharmacist if you have questions. COMMON BRAND NAME(S): ASCENIV, Baygam, BIVIGAM, Carimune, Carimune NF, cutaquig, Cuvitru, Flebogamma, Flebogamma DIF, GamaSTAN, GamaSTAN S/D, Gamimune N, Gammagard, Gammagard S/D, Gammaked, Gammaplex, Gammar-P IV, Gamunex, Gamunex-C, Hizentra, Iveegam, Iveegam EN, Octagam, Panglobulin, Panglobulin NF, panzyga, Polygam S/D, Privigen , Sandoglobulin, Venoglobulin-S, Vigam, Vivaglobulin, Xembify What should I tell my care team before I take this medication? They need to know if you have any of these conditions: Blood clotting disorder Condition where you have excess fluid in your body, such as heart failure or edema Dehydration Diabetes Have had blood clots Heart disease Immune system conditions Kidney disease Low levels of IgA Recent or upcoming vaccine An unusual or allergic reaction to immune globulin , other medications, foods, dyes, or preservatives Pregnant or trying to get pregnant Breastfeeding How should I use this medication? This medication is infused into a vein or under the skin. It is usually given by your care team in a hospital or clinic setting. It may also be given at home. If you get this medication at home, you will be taught how to prepare and give it. Use exactly as directed. Take it as directed on the prescription label at the same time every day. Keep taking it unless your care team tells you to stop. It is important that you put your used needles and syringes in a special sharps container. Do not put them in a trash can. If you do not have a sharps container, call your pharmacist or care team to get one. Talk to  your care team about the use of this medication in children. While it may be given to children for selected conditions, precautions do apply. Overdosage: If you think you have taken too much of this medicine contact a poison control center or emergency room at once. NOTE: This medicine is only for you. Do not share this medicine with others. What if I miss a dose? If you get this medication at the hospital or clinic: It is important not to miss your dose. Call your care team if you are unable to keep an appointment. If you give yourself this medication at home: If you miss a dose, take it as soon as you can. Then continue your normal schedule. If it is almost time for your next dose, take only that dose. Do not take double or extra doses. Call your care team with questions. What may interact with this medication? Live virus vaccines This list may not describe all possible interactions. Give your health care provider a list of all the medicines, herbs, non-prescription drugs, or dietary supplements you use. Also tell them if you smoke, drink alcohol, or use illegal drugs. Some items may interact with your medicine. What should I watch for while using this medication? Your condition will be monitored carefully while you are receiving this medication. Tell your care team if your symptoms do not start to get better or if they get worse. You may need blood work done while you are taking this medication. This medication increases the risk of blood clots. People with heart, blood vessel, or blood clotting conditions are more likely to develop a blood clot. Other risk factors include advanced age, estrogen  use, tobacco use, lack of movement, and being overweight. This medication can decrease the response to a vaccine. If you need to get vaccinated, tell your care team if you have received this medication within the last year. Extra booster doses may be needed. Talk to your care team to see if a different  vaccination schedule is needed. If you have diabetes, you may get a falsely elevated blood sugar reading. Talk to your care team about how to check your blood sugar while taking this medication. What side effects may I notice from receiving this medication? Side effects that you should report to your care team as soon as possible: Allergic reactions--skin rash, itching, hives, swelling of the face, lips, tongue, or throat Blood clot--pain, swelling, or warmth in the leg, shortness of breath, chest pain Fever, neck pain or stiffness, sensitivity to light, headache, nausea, vomiting, confusion, which may be signs of meningitis Hemolytic anemia--unusual weakness or fatigue, dizziness, headache, trouble breathing, dark urine, yellowing skin or eyes Kidney injury--decrease in the amount of urine, swelling of the ankles, hands, or feet Low sodium level--muscle weakness, fatigue, dizziness, headache, confusion Shortness of breath or trouble breathing, cough, unusual weakness or fatigue, blue skin or lips Side effects that usually do not require medical attention (report these to your care team if they continue or are bothersome): Chills Diarrhea Fever Headache Nausea This list may not describe all possible side effects. Call your doctor for medical advice about side effects. You may report side effects to FDA at 1-800-FDA-1088. Where should I keep my medication? Keep out of the reach of children and pets. You will be instructed on how to store this medication. Get rid of any unused medication after the expiration date. To get rid of medications that are no longer needed or have expired: Take the medication to a medication take-back program. Check with your pharmacy or law enforcement to find a location. If you cannot return the medication, ask your pharmacist or care team how to get rid of this medication safely. NOTE: This sheet is a summary. It may not cover all possible information. If you have  questions about this medicine, talk to your doctor, pharmacist, or health care provider.  2024 Elsevier/Gold Standard (2023-06-13 00:00:00)

## 2024-02-23 NOTE — Addendum Note (Signed)
 Addended by: DORLENE RONAL SQUIBB on: 02/23/2024 09:39 AM   Modules accepted: Orders

## 2024-02-24 ENCOUNTER — Inpatient Hospital Stay

## 2024-02-24 DIAGNOSIS — Z95828 Presence of other vascular implants and grafts: Secondary | ICD-10-CM

## 2024-02-24 DIAGNOSIS — M349 Systemic sclerosis, unspecified: Secondary | ICD-10-CM

## 2024-02-24 DIAGNOSIS — D509 Iron deficiency anemia, unspecified: Secondary | ICD-10-CM

## 2024-02-24 DIAGNOSIS — C50912 Malignant neoplasm of unspecified site of left female breast: Secondary | ICD-10-CM | POA: Diagnosis not present

## 2024-02-24 DIAGNOSIS — D631 Anemia in chronic kidney disease: Secondary | ICD-10-CM | POA: Diagnosis not present

## 2024-02-24 DIAGNOSIS — M3489 Other systemic sclerosis: Secondary | ICD-10-CM | POA: Diagnosis not present

## 2024-02-24 DIAGNOSIS — Z1722 Progesterone receptor negative status: Secondary | ICD-10-CM | POA: Diagnosis not present

## 2024-02-24 DIAGNOSIS — N1831 Chronic kidney disease, stage 3a: Secondary | ICD-10-CM | POA: Diagnosis not present

## 2024-02-24 DIAGNOSIS — Z170 Estrogen receptor positive status [ER+]: Secondary | ICD-10-CM | POA: Diagnosis not present

## 2024-02-24 LAB — CANCER ANTIGEN 27.29: CA 27.29: 57.6 U/mL — ABNORMAL HIGH (ref 0.0–38.6)

## 2024-02-24 MED ORDER — IMMUNE GLOBULIN (HUMAN) 20 GM/200ML IV SOLN
25.0000 g | Freq: Once | INTRAVENOUS | Status: AC
  Administered 2024-02-24 (×2): 25 g via INTRAVENOUS
  Filled 2024-02-24: qty 200

## 2024-02-24 MED ORDER — ACETAMINOPHEN 325 MG PO TABS: 650.0000 mg | ORAL_TABLET | Freq: Once | ORAL | Status: DC

## 2024-02-24 MED ORDER — DEXTROSE 5 % IV SOLN: INTRAVENOUS | Status: DC

## 2024-02-24 MED ORDER — DIPHENHYDRAMINE HCL 25 MG PO CAPS
25.0000 mg | ORAL_CAPSULE | Freq: Once | ORAL | Status: DC
Start: 2024-02-24 — End: 2024-02-24

## 2024-02-24 NOTE — Patient Instructions (Signed)
 Immune Globulin  Injection What is this medication? IMMUNE GLOBULIN  (im MUNE GLOB yoo lin) treats many immune system conditions. It works by Designer, multimedia extra antibodies. Antibodies are proteins made by the immune system that help protect the body. This medicine may be used for other purposes; ask your health care provider or pharmacist if you have questions. COMMON BRAND NAME(S): ASCENIV, Baygam, BIVIGAM, Carimune, Carimune NF, cutaquig, Cuvitru, Flebogamma, Flebogamma DIF, GamaSTAN, GamaSTAN S/D, Gamimune N, Gammagard, Gammagard S/D, Gammaked, Gammaplex, Gammar-P IV, Gamunex, Gamunex-C, Hizentra, Iveegam, Iveegam EN, Octagam, Panglobulin, Panglobulin NF, panzyga, Polygam S/D, Privigen , Sandoglobulin, Venoglobulin-S, Vigam, Vivaglobulin, Xembify What should I tell my care team before I take this medication? They need to know if you have any of these conditions: Blood clotting disorder Condition where you have excess fluid in your body, such as heart failure or edema Dehydration Diabetes Have had blood clots Heart disease Immune system conditions Kidney disease Low levels of IgA Recent or upcoming vaccine An unusual or allergic reaction to immune globulin , other medications, foods, dyes, or preservatives Pregnant or trying to get pregnant Breastfeeding How should I use this medication? This medication is infused into a vein or under the skin. It is usually given by your care team in a hospital or clinic setting. It may also be given at home. If you get this medication at home, you will be taught how to prepare and give it. Use exactly as directed. Take it as directed on the prescription label at the same time every day. Keep taking it unless your care team tells you to stop. It is important that you put your used needles and syringes in a special sharps container. Do not put them in a trash can. If you do not have a sharps container, call your pharmacist or care team to get one. Talk to  your care team about the use of this medication in children. While it may be given to children for selected conditions, precautions do apply. Overdosage: If you think you have taken too much of this medicine contact a poison control center or emergency room at once. NOTE: This medicine is only for you. Do not share this medicine with others. What if I miss a dose? If you get this medication at the hospital or clinic: It is important not to miss your dose. Call your care team if you are unable to keep an appointment. If you give yourself this medication at home: If you miss a dose, take it as soon as you can. Then continue your normal schedule. If it is almost time for your next dose, take only that dose. Do not take double or extra doses. Call your care team with questions. What may interact with this medication? Live virus vaccines This list may not describe all possible interactions. Give your health care provider a list of all the medicines, herbs, non-prescription drugs, or dietary supplements you use. Also tell them if you smoke, drink alcohol, or use illegal drugs. Some items may interact with your medicine. What should I watch for while using this medication? Your condition will be monitored carefully while you are receiving this medication. Tell your care team if your symptoms do not start to get better or if they get worse. You may need blood work done while you are taking this medication. This medication increases the risk of blood clots. People with heart, blood vessel, or blood clotting conditions are more likely to develop a blood clot. Other risk factors include advanced age, estrogen  use, tobacco use, lack of movement, and being overweight. This medication can decrease the response to a vaccine. If you need to get vaccinated, tell your care team if you have received this medication within the last year. Extra booster doses may be needed. Talk to your care team to see if a different  vaccination schedule is needed. If you have diabetes, you may get a falsely elevated blood sugar reading. Talk to your care team about how to check your blood sugar while taking this medication. What side effects may I notice from receiving this medication? Side effects that you should report to your care team as soon as possible: Allergic reactions--skin rash, itching, hives, swelling of the face, lips, tongue, or throat Blood clot--pain, swelling, or warmth in the leg, shortness of breath, chest pain Fever, neck pain or stiffness, sensitivity to light, headache, nausea, vomiting, confusion, which may be signs of meningitis Hemolytic anemia--unusual weakness or fatigue, dizziness, headache, trouble breathing, dark urine, yellowing skin or eyes Kidney injury--decrease in the amount of urine, swelling of the ankles, hands, or feet Low sodium level--muscle weakness, fatigue, dizziness, headache, confusion Shortness of breath or trouble breathing, cough, unusual weakness or fatigue, blue skin or lips Side effects that usually do not require medical attention (report these to your care team if they continue or are bothersome): Chills Diarrhea Fever Headache Nausea This list may not describe all possible side effects. Call your doctor for medical advice about side effects. You may report side effects to FDA at 1-800-FDA-1088. Where should I keep my medication? Keep out of the reach of children and pets. You will be instructed on how to store this medication. Get rid of any unused medication after the expiration date. To get rid of medications that are no longer needed or have expired: Take the medication to a medication take-back program. Check with your pharmacy or law enforcement to find a location. If you cannot return the medication, ask your pharmacist or care team how to get rid of this medication safely. NOTE: This sheet is a summary. It may not cover all possible information. If you have  questions about this medicine, talk to your doctor, pharmacist, or health care provider.  2024 Elsevier/Gold Standard (2023-06-13 00:00:00)

## 2024-02-25 ENCOUNTER — Inpatient Hospital Stay

## 2024-02-25 ENCOUNTER — Other Ambulatory Visit: Payer: Self-pay | Admitting: Family Medicine

## 2024-02-25 VITALS — BP 81/67 | HR 90 | Temp 98.2°F | Resp 18 | Wt 127.0 lb

## 2024-02-25 DIAGNOSIS — D631 Anemia in chronic kidney disease: Secondary | ICD-10-CM | POA: Diagnosis not present

## 2024-02-25 DIAGNOSIS — Z170 Estrogen receptor positive status [ER+]: Secondary | ICD-10-CM | POA: Diagnosis not present

## 2024-02-25 DIAGNOSIS — C50912 Malignant neoplasm of unspecified site of left female breast: Secondary | ICD-10-CM | POA: Diagnosis not present

## 2024-02-25 DIAGNOSIS — Z1722 Progesterone receptor negative status: Secondary | ICD-10-CM | POA: Diagnosis not present

## 2024-02-25 DIAGNOSIS — D509 Iron deficiency anemia, unspecified: Secondary | ICD-10-CM

## 2024-02-25 DIAGNOSIS — N1831 Chronic kidney disease, stage 3a: Secondary | ICD-10-CM | POA: Diagnosis not present

## 2024-02-25 DIAGNOSIS — M3489 Other systemic sclerosis: Secondary | ICD-10-CM | POA: Diagnosis not present

## 2024-02-25 LAB — IGG, IGA, IGM
IgA: 131 mg/dL (ref 64–422)
IgG (Immunoglobin G), Serum: 1001 mg/dL (ref 586–1602)
IgM (Immunoglobulin M), Srm: 54 mg/dL (ref 26–217)

## 2024-02-25 LAB — ALDOLASE: Aldolase: 6.8 U/L (ref 3.3–10.3)

## 2024-02-25 MED ORDER — IMMUNE GLOBULIN (HUMAN) 20 GM/200ML IV SOLN
25.0000 g | Freq: Once | INTRAVENOUS | Status: AC
  Administered 2024-02-25 (×2): 25 g via INTRAVENOUS
  Filled 2024-02-25: qty 200

## 2024-02-25 MED ORDER — DIPHENHYDRAMINE HCL 25 MG PO CAPS: 25.0000 mg | ORAL_CAPSULE | Freq: Once | ORAL | Status: DC

## 2024-02-25 MED ORDER — DEXTROSE 5 % IV SOLN: INTRAVENOUS | Status: DC

## 2024-02-25 MED ORDER — ACETAMINOPHEN 325 MG PO TABS: 650.0000 mg | ORAL_TABLET | Freq: Once | ORAL | Status: DC

## 2024-02-25 NOTE — Patient Instructions (Signed)
 [facility] - A DEPT OF [facility]  Discharge Instructions: Thank you for choosing [facility] to provide your oncology and hematology care.   If you have a lab appointment with the Cancer Center, please go directly to the Cancer Center and check in at the registration area.  Wear comfortable clothing and clothing appropriate for easy access to any Portacath or PICC line.   We strive to give you quality time with your provider. You may need to reschedule your appointment if you arrive late (15 or more minutes).  Arriving late affects you and other patients whose appointments are after yours.  Also, if you miss three or more appointments without notifying the office, you may be dismissed from the clinic at the provider's discretion.      For prescription refill requests, have your pharmacy contact our office and allow 72 hours for refills to be completed.    Today you received the following agents ivig      To help prevent nausea and vomiting after your treatment, we encourage you to take your nausea medication as directed.  BELOW ARE SYMPTOMS THAT SHOULD BE REPORTED IMMEDIATELY: *FEVER GREATER THAN 100.4 F (38 C) OR HIGHER *CHILLS OR SWEATING *NAUSEA AND VOMITING THAT IS NOT CONTROLLED WITH YOUR NAUSEA MEDICATION *UNUSUAL SHORTNESS OF BREATH *UNUSUAL BRUISING OR BLEEDING *URINARY PROBLEMS (pain or burning when urinating, or frequent urination) *BOWEL PROBLEMS (unusual diarrhea, constipation, pain near the anus) TENDERNESS IN MOUTH AND THROAT WITH OR WITHOUT PRESENCE OF ULCERS (sore throat, sores in mouth, or a toothache) UNUSUAL RASH, SWELLING OR PAIN  UNUSUAL VAGINAL DISCHARGE OR ITCHING   Items with * indicate a potential emergency and should be followed up as soon as possible or go to the Emergency Department if any problems should occur.  Please show the CHEMOTHERAPY ALERT CARD or IMMUNOTHERAPY ALERT CARD at check-in to the Emergency  Department and triage nurse. Should you have questions after your visit or need to cancel or reschedule your appointment, please contact [facility] - A DEPT OF [facility]  [phone] and follow the prompts.  Office hours are 8:00 a.m. to 4:30 p.m. Monday - Friday. Please note that voicemails left after 4:00 p.m. may not be returned until the following business day.  We are closed weekends and major holidays. You have access to a nurse at all times for urgent questions. Please call the main number to the clinic [phone] and follow the prompts.  For any non-urgent questions, you may also contact your provider using MyChart. We now offer e-Visits for anyone 73 and older to request care online for non-urgent symptoms. For details visit [email or web address].   Also download the MyChart app! Go to the app store, search MyChart, open the app, select [place], and log in with your MyChart username and password.

## 2024-02-26 ENCOUNTER — Inpatient Hospital Stay

## 2024-02-26 ENCOUNTER — Telehealth: Payer: Self-pay | Admitting: Hematology & Oncology

## 2024-02-26 VITALS — BP 107/57 | HR 61 | Temp 98.0°F | Resp 18

## 2024-02-26 DIAGNOSIS — D509 Iron deficiency anemia, unspecified: Secondary | ICD-10-CM

## 2024-02-26 DIAGNOSIS — D631 Anemia in chronic kidney disease: Secondary | ICD-10-CM | POA: Diagnosis not present

## 2024-02-26 DIAGNOSIS — N1831 Chronic kidney disease, stage 3a: Secondary | ICD-10-CM | POA: Diagnosis not present

## 2024-02-26 DIAGNOSIS — M3489 Other systemic sclerosis: Secondary | ICD-10-CM | POA: Diagnosis not present

## 2024-02-26 DIAGNOSIS — C50912 Malignant neoplasm of unspecified site of left female breast: Secondary | ICD-10-CM | POA: Diagnosis not present

## 2024-02-26 DIAGNOSIS — Z170 Estrogen receptor positive status [ER+]: Secondary | ICD-10-CM | POA: Diagnosis not present

## 2024-02-26 DIAGNOSIS — Z1722 Progesterone receptor negative status: Secondary | ICD-10-CM | POA: Diagnosis not present

## 2024-02-26 MED ORDER — IMMUNE GLOBULIN (HUMAN) 20 GM/200ML IV SOLN
25.0000 g | Freq: Once | INTRAVENOUS | Status: AC
  Administered 2024-02-26: 25 g via INTRAVENOUS
  Filled 2024-02-26 (×3): qty 250

## 2024-02-26 MED ORDER — ACETAMINOPHEN 325 MG PO TABS: 650.0000 mg | ORAL_TABLET | Freq: Once | ORAL | Status: DC

## 2024-02-26 MED ORDER — DIPHENHYDRAMINE HCL 25 MG PO CAPS: 25.0000 mg | ORAL_CAPSULE | Freq: Once | ORAL | Status: DC

## 2024-02-26 MED ORDER — DEXTROSE 5 % IV SOLN: INTRAVENOUS | Status: DC

## 2024-02-26 NOTE — Patient Instructions (Signed)
 Immune Globulin  Injection What is this medication? IMMUNE GLOBULIN  (im MUNE GLOB yoo lin) treats many immune system conditions. It works by Designer, multimedia extra antibodies. Antibodies are proteins made by the immune system that help protect the body. This medicine may be used for other purposes; ask your health care provider or pharmacist if you have questions. COMMON BRAND NAME(S): ASCENIV, Baygam, BIVIGAM, Carimune, Carimune NF, cutaquig, Cuvitru, Flebogamma, Flebogamma DIF, GamaSTAN, GamaSTAN S/D, Gamimune N, Gammagard, Gammagard S/D, Gammaked, Gammaplex, Gammar-P IV, Gamunex, Gamunex-C, Hizentra, Iveegam, Iveegam EN, Octagam, Panglobulin, Panglobulin NF, panzyga, Polygam S/D, Privigen , Sandoglobulin, Venoglobulin-S, Vigam, Vivaglobulin, Xembify What should I tell my care team before I take this medication? They need to know if you have any of these conditions: Blood clotting disorder Condition where you have excess fluid in your body, such as heart failure or edema Dehydration Diabetes Have had blood clots Heart disease Immune system conditions Kidney disease Low levels of IgA Recent or upcoming vaccine An unusual or allergic reaction to immune globulin , other medications, foods, dyes, or preservatives Pregnant or trying to get pregnant Breastfeeding How should I use this medication? This medication is infused into a vein or under the skin. It is usually given by your care team in a hospital or clinic setting. It may also be given at home. If you get this medication at home, you will be taught how to prepare and give it. Use exactly as directed. Take it as directed on the prescription label at the same time every day. Keep taking it unless your care team tells you to stop. It is important that you put your used needles and syringes in a special sharps container. Do not put them in a trash can. If you do not have a sharps container, call your pharmacist or care team to get one. Talk to  your care team about the use of this medication in children. While it may be given to children for selected conditions, precautions do apply. Overdosage: If you think you have taken too much of this medicine contact a poison control center or emergency room at once. NOTE: This medicine is only for you. Do not share this medicine with others. What if I miss a dose? If you get this medication at the hospital or clinic: It is important not to miss your dose. Call your care team if you are unable to keep an appointment. If you give yourself this medication at home: If you miss a dose, take it as soon as you can. Then continue your normal schedule. If it is almost time for your next dose, take only that dose. Do not take double or extra doses. Call your care team with questions. What may interact with this medication? Live virus vaccines This list may not describe all possible interactions. Give your health care provider a list of all the medicines, herbs, non-prescription drugs, or dietary supplements you use. Also tell them if you smoke, drink alcohol, or use illegal drugs. Some items may interact with your medicine. What should I watch for while using this medication? Your condition will be monitored carefully while you are receiving this medication. Tell your care team if your symptoms do not start to get better or if they get worse. You may need blood work done while you are taking this medication. This medication increases the risk of blood clots. People with heart, blood vessel, or blood clotting conditions are more likely to develop a blood clot. Other risk factors include advanced age, estrogen  use, tobacco use, lack of movement, and being overweight. This medication can decrease the response to a vaccine. If you need to get vaccinated, tell your care team if you have received this medication within the last year. Extra booster doses may be needed. Talk to your care team to see if a different  vaccination schedule is needed. If you have diabetes, you may get a falsely elevated blood sugar reading. Talk to your care team about how to check your blood sugar while taking this medication. What side effects may I notice from receiving this medication? Side effects that you should report to your care team as soon as possible: Allergic reactions--skin rash, itching, hives, swelling of the face, lips, tongue, or throat Blood clot--pain, swelling, or warmth in the leg, shortness of breath, chest pain Fever, neck pain or stiffness, sensitivity to light, headache, nausea, vomiting, confusion, which may be signs of meningitis Hemolytic anemia--unusual weakness or fatigue, dizziness, headache, trouble breathing, dark urine, yellowing skin or eyes Kidney injury--decrease in the amount of urine, swelling of the ankles, hands, or feet Low sodium level--muscle weakness, fatigue, dizziness, headache, confusion Shortness of breath or trouble breathing, cough, unusual weakness or fatigue, blue skin or lips Side effects that usually do not require medical attention (report these to your care team if they continue or are bothersome): Chills Diarrhea Fever Headache Nausea This list may not describe all possible side effects. Call your doctor for medical advice about side effects. You may report side effects to FDA at 1-800-FDA-1088. Where should I keep my medication? Keep out of the reach of children and pets. You will be instructed on how to store this medication. Get rid of any unused medication after the expiration date. To get rid of medications that are no longer needed or have expired: Take the medication to a medication take-back program. Check with your pharmacy or law enforcement to find a location. If you cannot return the medication, ask your pharmacist or care team how to get rid of this medication safely. NOTE: This sheet is a summary. It may not cover all possible information. If you have  questions about this medicine, talk to your doctor, pharmacist, or health care provider.  2024 Elsevier/Gold Standard (2023-06-13 00:00:00)

## 2024-02-26 NOTE — Progress Notes (Signed)
Patient does not want to stay for the 30 minute recommended IVIG observation. Patient discharged ambulatory without complaints or concerns.

## 2024-02-27 ENCOUNTER — Inpatient Hospital Stay

## 2024-02-27 VITALS — BP 117/70 | HR 85 | Temp 97.7°F | Resp 18

## 2024-02-27 DIAGNOSIS — Z170 Estrogen receptor positive status [ER+]: Secondary | ICD-10-CM | POA: Diagnosis not present

## 2024-02-27 DIAGNOSIS — N1831 Chronic kidney disease, stage 3a: Secondary | ICD-10-CM | POA: Diagnosis not present

## 2024-02-27 DIAGNOSIS — D631 Anemia in chronic kidney disease: Secondary | ICD-10-CM | POA: Diagnosis not present

## 2024-02-27 DIAGNOSIS — M349 Systemic sclerosis, unspecified: Secondary | ICD-10-CM

## 2024-02-27 DIAGNOSIS — C50912 Malignant neoplasm of unspecified site of left female breast: Secondary | ICD-10-CM | POA: Diagnosis not present

## 2024-02-27 DIAGNOSIS — M3489 Other systemic sclerosis: Secondary | ICD-10-CM | POA: Diagnosis not present

## 2024-02-27 DIAGNOSIS — Z1722 Progesterone receptor negative status: Secondary | ICD-10-CM | POA: Diagnosis not present

## 2024-02-27 DIAGNOSIS — D509 Iron deficiency anemia, unspecified: Secondary | ICD-10-CM

## 2024-02-27 MED ORDER — DIPHENHYDRAMINE HCL 25 MG PO CAPS: 25.0000 mg | ORAL_CAPSULE | Freq: Once | ORAL | Status: DC

## 2024-02-27 MED ORDER — DEXTROSE 5 % IV SOLN: INTRAVENOUS | Status: DC

## 2024-02-27 MED ORDER — ACETAMINOPHEN 325 MG PO TABS: 650.0000 mg | ORAL_TABLET | Freq: Once | ORAL | Status: DC

## 2024-02-27 MED ORDER — IMMUNE GLOBULIN (HUMAN) 20 GM/200ML IV SOLN
25.0000 g | Freq: Once | INTRAVENOUS | Status: AC
  Administered 2024-02-27: 25 g via INTRAVENOUS
  Filled 2024-02-27: qty 50

## 2024-02-27 NOTE — Telephone Encounter (Signed)
 Was asked to get ABN on patient on 02/25/24. Followed up with Katie Danner who stated that ABN was not needed on this patient.   Per Katie's email: [name] has reviewed this and there was no need for an ABN to begin with.  There is a process designated for patients needing an ABN and it is initiated by the Authorization team.  As a part of that process, if an ABN needs to be signed, the campus Admin leader and Registration team would be notified in advance.  No need to worry about being dropped in your lap as a surprise.

## 2024-03-02 DIAGNOSIS — K208 Other esophagitis without bleeding: Secondary | ICD-10-CM | POA: Diagnosis not present

## 2024-03-02 DIAGNOSIS — M341 CR(E)ST syndrome: Secondary | ICD-10-CM | POA: Diagnosis not present

## 2024-03-02 DIAGNOSIS — Z79899 Other long term (current) drug therapy: Secondary | ICD-10-CM | POA: Diagnosis not present

## 2024-03-02 DIAGNOSIS — I7300 Raynaud's syndrome without gangrene: Secondary | ICD-10-CM | POA: Diagnosis not present

## 2024-03-02 DIAGNOSIS — Z79620 Long term (current) use of immunosuppressive biologic: Secondary | ICD-10-CM | POA: Diagnosis not present

## 2024-03-02 DIAGNOSIS — M349 Systemic sclerosis, unspecified: Secondary | ICD-10-CM | POA: Diagnosis not present

## 2024-03-02 DIAGNOSIS — R5383 Other fatigue: Secondary | ICD-10-CM | POA: Diagnosis not present

## 2024-03-02 DIAGNOSIS — J849 Interstitial pulmonary disease, unspecified: Secondary | ICD-10-CM | POA: Diagnosis not present

## 2024-03-02 DIAGNOSIS — M3481 Systemic sclerosis with lung involvement: Secondary | ICD-10-CM | POA: Diagnosis not present

## 2024-03-02 DIAGNOSIS — Z853 Personal history of malignant neoplasm of breast: Secondary | ICD-10-CM | POA: Diagnosis not present

## 2024-03-02 DIAGNOSIS — D649 Anemia, unspecified: Secondary | ICD-10-CM | POA: Diagnosis not present

## 2024-03-02 DIAGNOSIS — I2721 Secondary pulmonary arterial hypertension: Secondary | ICD-10-CM | POA: Diagnosis not present

## 2024-03-02 DIAGNOSIS — R0600 Dyspnea, unspecified: Secondary | ICD-10-CM | POA: Diagnosis not present

## 2024-03-08 ENCOUNTER — Encounter: Payer: Self-pay | Admitting: Internal Medicine

## 2024-03-08 ENCOUNTER — Ambulatory Visit (INDEPENDENT_AMBULATORY_CARE_PROVIDER_SITE_OTHER): Admitting: Internal Medicine

## 2024-03-08 VITALS — BP 106/68 | HR 77 | Ht 64.0 in | Wt 133.6 lb

## 2024-03-08 DIAGNOSIS — R748 Abnormal levels of other serum enzymes: Secondary | ICD-10-CM

## 2024-03-08 DIAGNOSIS — G729 Myopathy, unspecified: Secondary | ICD-10-CM

## 2024-03-08 DIAGNOSIS — Z79899 Other long term (current) drug therapy: Secondary | ICD-10-CM | POA: Diagnosis not present

## 2024-03-08 DIAGNOSIS — J8489 Other specified interstitial pulmonary diseases: Secondary | ICD-10-CM

## 2024-03-08 DIAGNOSIS — M359 Systemic involvement of connective tissue, unspecified: Secondary | ICD-10-CM

## 2024-03-08 DIAGNOSIS — M349 Systemic sclerosis, unspecified: Secondary | ICD-10-CM | POA: Diagnosis not present

## 2024-03-08 NOTE — Patient Instructions (Addendum)
 ICD-10-CM   1. Interstitial lung disease due to connective tissue disease (HCC)  J84.89 Pulmonary function test   M35.9     2. Diffuse systemic sclerosis (HCC)  M34.9 Pulmonary function test    3. 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 Elevated CK  R74.8 Pulmonary function test    5. High risk medication use  Z79.899         Scleroderma interstitial lung disease  -Clinically stable since restart of CellCept - PFT improved at [facility] as of March 2025 ; next PFT In Nov 2025 - feeling stable  Plan  - CellCept directivee by [facility] and [employer] - keep appt with them Nov 2025 2025 - do spiro and dlco here in 6 months (feb 2026)  - if there is progression, consider HRCT there is oral ofev and inhaled clinical trials available  Myopathy/elevated CK  - IVIG given locally in [facility] through your hematologist as directed by [facility] [employer]  - this is helping you and CK dec 2024 and Aug 2025 is normal  - However, severe side effects from IvIG  Plan - Noted  POTENTIAL plans to hold IvIG by [facility]  - CK and PFT monitoring through [facility] and [place] with blood work through Dr Timmy her locally - emailed you ivIG literature  Acute scleroderma renal crisis [facility]) -January 2024 in the setting of stopping CellCept because of shingles   -Creatinine in dec 2024 is1.15mg  % and 1.1mg % in Aug 2025 significantly better since early 2024  Plan -According to renal    Follow-up (shared decision making) -6 months face-to-face visit with Dr. Geronimo ; 30 min

## 2024-03-08 NOTE — Progress Notes (Addendum)
 08/27/22 [name] [name]   IMPRESSION: 1. Interstitial lung disease/fibrosis. Nonspecific patchy bilateral groundglass opacities. 2. Moderate pericardial effusion. 3. Small left pleural effusion, trace right pleural effusion. 4. Mediastinal adenopathy.  Assessment and Plan [name] is a 73 y.o. 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 The patient also has a history of left breast cancer. Since her last visit, she has evidence of increased SSc disease activity as manifested by skin thickening, oropharyngeal dysphagia, tendon friction rubs and scleroderma renal crisis. Her course has also been complicated by worsening lower extremity edema. It is unclear what is driving her worsening disease activity, though notably she had been off of MMF for a period of time prior to the recurrence of her symptoms. Notably, she has an elevated CA 27.29 in the past - which makes one wonder about subclinical malignancy driving her symptoms.  Cutaneous: Skin score today was 17, a significant increase from her score of 3 at our last visit; we note that some of this is confounded by her lower extremity edema. However, despite this, we are in agreement that her skin score has worsened. She has been restarted on mycophenolate mofetil, and is currently on 1000 mg BID. We discussed that given her renal function we would not increase this further. She does have a history of difficult to treat shingles (lasted 9 months) when she previously was on MMF. She has been placed on valacyclovir  prophylaxis. We discussed that it would likely take a few months to see the full effect of this medication. - Continue MMF 1000 mg BID - Monitoring labs with local rheumatologist  Raynaud's phenomenon: Her symptoms are  stable. She is on amlodipine  for blood pressure management which may have additional benefit for her RP.  Cardiopulmonary: CT scan demonstrated bilateral ground glass opacities; however, these are non-specific in the setting of her volume overload. In addition, her PFTs are difficult to interpret in the setting of her known volume overload as well. Once she is euvolemic, these tests should be repeated. She is also planned to see Dr. Phebe in pulmonary in our Center tomorrow.  Gastrointestinal: She has GERD which is stable on pantoprazole  40 mg daily. Barium swallow demonstrated cricopharyngeal bar. She is planned for endoscopy next week to further evaluate her oropharyngeal dysphagia.  MSK: She has evidence of tendon friction rubs on exam, though she denies significant arthralgias. No evidence of inflammatory arthritis. She notes generalized weakness in the setting of her recent hospitalization but her strength is intact on exam.  Renal: She recently was hospitalized in the setting of scleroderma renal crisis. Her blood pressure today was stable at 122/68, and she has been monitoring her blood pressure closely at home. She is currently managed with lisinopril  40 mg and amlodipine  10 mg . She does not currently have a nephrologist and we strongly recommended that she establish care with one. We will reach out to Dr. Maree at [facility] to help coordinate care.  Patient was evaluated with Dr. Maree after which the above assessment and plan was formulated. Please see attending attestation for further details.    08/28/2022 Dr. Donnice [name] -[facility] interstitial lung [facility]   Rheumatologist: Dr. Maree Primary pulmonologist, if applicable: Seen previously in the [facility]D center by Dr. Layman  YEP:[name] is a 73 y.o. female with a PMH  of SSc and ILD who presents for an initial evaluation regarding dyspnea. She is followed by Dr. Maree and was first diagnosed with scleroderma  in 2017 based on a high titer ANA and skin thickening. She has been followed previously in the [facility] center, with her most recent office note being in 2020. In terms of her respiratory history, she had a dry cough that developed in 2018 and had a chest CT done that year which showed bibasilar interstitial infiltrates consistent with an NSIP pattern. She was started on CellCept in December 2018 with significant improvement in [facility] and diffusion capacity. Her CellCept was stopped in January 2023 due to prolonged episode of shingles.  She was recently admitted to [facility] for scleroderma renal crisis. By the time of discharge, she had improvement but not normalization of her creatinine. Blood pressure was better controlled. Mycophenolate was added back due to a worsening of her skin disease (now on 1000mg  BID). Since she was discharged from the hospital, she feels like she is slowly improving but continues to have increased edema.  Was able to walk up and down stairs easily in October, when she had an anniversary party  Since her hospitalization, she has had trouble walking up her steps.   Impression: [name] is a 18 y.o. 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 She had signs of mild interstitial lung disease on the CAT scan report from 2018. She was started on CellCept at that point and had a significant improvement in the forced vital capacity. Overall, she feels like her breathing has been worse since July 2023. She recently was discharged from [facility] with scleroderma renal crisis. Her creatinine continues to improve, but she still exhibits signs of volume overload both on exam and imaging with a moderate pericardial effusion and small bilateral pleural effusions. Her breathing symptoms have been worse in the context and her pulmonary function tests this week are challenging to interpret with her acute recent hospitalization.  However, she did have PFTs done at [facility] in October, which was prior to her hospitalization that showed a pattern of worsening in terms of her FVC and DLCO. So it clearly appears that she had progressive ILD even before her scleroderma renal crisis. She started back on CellCept 1000 mg twice per day. Dr. Stella) [name] would like her to stay on this dose rather than increase it to 1500 mg twice per day because of her kidney function.  Plan: -Continue CellCept. Agree with the current dosing, but as her creatinine improves I would prefer to get her up to 1500 mg twice per day -Repeat PFTs in 3 months - I note that her CK and inflammatory markers are elevated. This is likely in the context of her renal crisis. I will talk to Dr. Maree about IVIG, although the amount of volume would not be well-tolerated at this point. -Continue furosemide  through the weekend. Her rheumatologist at [facility] is working on getting her a sooner appointment with a nephrologist, who will be directing her diuretic therapy. For now I would suggest getting weekly creatinine values until she can see nephrology   OV 09/05/2022 - new consult = referred by Dr Belvie Silvan  Subjective:  Patient ID: [name], female , DOB: 05/01/1951 , age 23 y.o. , MRN: [id number] , ADDRESS: [address] [place] [address] PCP Copland, Harlene BROCKS, MD Patient Care Team: Watt Harlene BROCKS, MD as PCP - General (Family  Medicine) Patel, Donika K, DO as Consulting Physician (Neurology) Mathew Carliss SQUIBB, MD Valera Vola Bucco, MD as Referring Physician (Rheumatology) [name] Mitchell Atlas, MD as Referring Physician (Pulmonary Disease) Cleotilde Ronal RAMAN, MD as Consulting Physician (Gynecology) [name] Llanos, MD as Referring Physician (Internal Medicine) Joshua Blamer, MD as Consulting Physician (Dermatology) Cleotilde Ronal RAMAN, MD as Consulting Physician (Gynecology) Pyrtle, Gordy HERO, MD as Consulting Physician (Gastroenterology) Timmy Maude SAUNDERS, MD as  Medical Oncologist (Oncology)  This Provider for this visit: Treatment Team:  Attending Provider: Geronimo Amel, MD    09/05/2022 -   Chief Complaint  Patient presents with   Consult    Scleroderma      HPI NOELL LORENSEN 73 y.o. -history is from the patient, husband acting as independent historian, detailed review of the external medical records [address] and also admissions and also [name].  The pertinent details from recent [name] visit with both rheumatology and pulmonary is detailed above.  Patient is personal friend of Rojelio Silvan who is known to me.  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  She is being followed at [facility].  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  But pulmonary function test has been stable all the way through 2022.  During this time she was maintained on CellCept through Dr.[employer]  Then in early 2023 developed shingles around the left ear and the neck.  Husband did show photographs of this.  During this time CellCept was making the shingles record and poorly healing.  Therefore CellCept had to be stopped.  She was pretty much off CellCept through the course of 2023.  During this time as of October 2023 her pulmonary function decline compared to 2022 but she was actually unaware of this.  Then abruptly in January 2024 she ended up with hypertension and prior to that was having worsening pedal edema].  She was admitted for renal crisis at [facility].  Her baseline creatinine was 1.1 mg percent [November 2023] but on 08/09/2022 it was 1.8 mg percent.  The following day on 08/10/2022 she was transferred to [facility] because of complex medical condition.   She was started on CellCept there she spent 6 days then then discharge.  During this time because of concern of shingles recommend she is being maintained on daily acyclovir.  She is also on amlodipine  and lisinopril .  She is continuing this.  She is also on diuresis.  She is continuing this.  She says she is diuresed well.  Currently blood pressures have improved significantly between 109 - 128 systolic.  Of note while at [facility] health she did have CT scan of the chest [I personally visualized today] she actually has ILD although radiologist does not mention this.  She has pericardial effusion both on the echo at [facility] and the CT scan.  This was absent even as late as October 2023.  She followed up with rheumatology and pulmonary at [facility] both Dr. Ronita [name] [rheumatology] and also Dr. Donnice Cancer in pulmonary.  Their notes are documented below but the consensus that she actually has ILD and is actually progressive even before the renal crisis.  The pulmonary function test after renal crisis shows further progression although this could be clouded by all  the volume overload issues.  The supporting CellCept.  It appears nintedanib was not discussed with the patient.  She does not recollect this.  They are concerned about her dysphagia and I recommended endoscopy but given the pericardial effusion want to pulmonary hypertension/cardiac issues stabilized before undergoing any endoscopy there also recommended weekly creatinine.  Her latest creatinine yesterday was 1.9 mg percent.  [Peak creatinine 2.03 mg percent 08/22/2022).  She is trying to get an appointment with nephrology.  I personally spoke to Dr. Fairy: None ordered today over the phone.  Of note she has never had a right heart catheterization. It appears she has not seen [facility] pulmonary/ILD     Past Medical History :   In terms of her breast cancer, she had a left sided lumpectomy and radiation. She is being followed for a possible lesion on  her right breast, but recent testing has been negative (other than biomarkers)  SSc disease characteristics from [facility] Cutaneous subtype: Diffuse Autoantibody profile: RNA pol III Pulmonary involvement: ILD Cardiac involvement: Pericardial effusion GI involvement: Dysphagia Skin/vascular: Raynaud's, Telangiectases, and Sclerodactyly  She did get radiation for breast cancer.   ROS:  -Positive for scleroderma with Raynaud's.  It appears the skin score is worsened -Immunosuppressed status - Shingles in 2023 - Dysphagia - Fatigue - Dry eyes  FAMILY HISTORY of LUNG DISEASE:  -Negative  PERSONAL EXPOSURE HISTORY:  -Denies any smoking or marijuana or vaping or intravenous drug use.  HOME  EXPOSURE and HOBBY DETAILS :  [place]  [place] is lived there for 17 years.  The age of the home is 17 years.  Detail organic and inorganic antigen history is negative.  She is retired.  She is involved with the [facility] [employer] [facility] [employer].  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  Detail organic and inorganic antigen exposure history is negative.  PULMONARY TOXICITY HISTORY (27 items):  X-radiation for breast cancer  INVESTIGATIONS: -GFR of 25.9 with an AST of 43 and a creatinine 1.9 mg percent 09/04/2022.  HRCT 05/20/2017  - at [facility] Per report   1.  Mild lower lobe and peripheral predominant reticulations, minimal traction bronchiectasis, and ground glass opacities with no honeycombing. Findings representing probable UIP pattern as per Fleischner criteria. 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 2.  Interval development of small bilateral pleural effusions and a small pericardial effusion. 3.  Interval increase in anterior left lower lobe pleural thickening and subpleural reticulation suggestive of post radiation changes. Is only seen on prior examination. 4.  There  is a nonspecific 2 mm nodule in the right upper lobe which is not seen on prior examination likely due to different technique. Attention on follow-up examination as per clinical protocol is recommended.  Electronically Signed by:  Oris Schick, MD, [facility] Electronically Signed on:  05/20/2017 2:04 PM Procedure Note  Schick Oris, MD - 05/20/2017 Formatting of this note might be different from the original. Chest CT  Indication: Interstitial lung disease, Scleroderma involving lung , M34.9 Systemic sclerosis, unspecified (CMS-HCC).  Comparison: January 21, 2017   CT Chest data wo contrst Jan 2024 -personally visualized.  My personal interpretation is that there is ILD.  ILD is also been reported in the subsequent high-resolution CT chest at [facility]  Narrative & Impression  CLINICAL DATA:  Shortness of breath.  History of breast cancer.   EXAM: CT CHEST WITHOUT CONTRAST   TECHNIQUE:  Multidetector CT imaging of the chest was performed following the standard protocol without IV contrast.   RADIATION DOSE REDUCTION: This exam was performed according to the departmental dose-optimization program which includes automated exposure control, adjustment of the mA and/or kV according to patient size and/or use of iterative reconstruction technique.   COMPARISON:  August 09, 2022.  February 28, 2022.   FINDINGS: Cardiovascular: No evidence of thoracic aortic aneurysm. Mild cardiomegaly is noted. Mild to moderate pericardial effusion is noted.   Mediastinum/Nodes: No enlarged mediastinal or axillary lymph nodes. Thyroid  gland, trachea, and esophagus demonstrate no significant findings.   Lungs/Pleura: Small bilateral pleural effusions are noted. Minimal bibasilar pulmonary edema or subsegmental atelectasis is noted. No pneumothorax is noted.   Upper Abdomen: No acute abnormality.   Musculoskeletal: Stable old left rib fractures are noted. No acute osseous abnormality is  noted.   IMPRESSION: Mild to moderate size pericardial effusion is now noted.   Small bilateral pleural effusions are noted with associated minimal bibasilar pulmonary edema or subsegmental atelectasis.     Electronically Signed   By: Lynwood Landy Raddle M.D.   On: 08/10/2022 14:47      CT CHEST HRCT 08/26/22 at [facility] -this was done at [facility].  I personally could not visualize this because the upload system would not work.  IMPRESSION: 1.  Interstitial lung disease/fibrosis. Nonspecific patchy bilateral groundglass opacities. 2.  Moderate pericardial effusion. 3.  Small left pleural effusion, trace right pleural effusion. 4.  Mediastinal adenopathy.  Images and interpretation personally reviewed by: Oliva VEAR Mt, MD Exam End: 08/26/22 16:43   Specimen Collected: 08/26/22 16:45 Last Resulted: 08/26/22 16:49  Received From: [facility]    ECHO 08/10/22 (no effusion in Oct 2023)   Sonographer Comments: Image acquisition challenging due to breast  implants.  IMPRESSIONS     1. Left ventricular ejection fraction, by estimation, is 60 to 65%. The  left ventricle has normal function. The left ventricle has no regional  wall motion abnormalities. There is mild left ventricular hypertrophy of  the basal-septal segment. Left  ventricular diastolic parameters are consistent with Grade I diastolic  dysfunction (impaired relaxation).   2. Right ventricular systolic function is normal. The right ventricular  size is normal.   3. Moderate pericardial effusion. There is no evidence of cardiac  tamponade.   4. The mitral valve is normal in structure. Trivial mitral valve  regurgitation. No evidence of mitral stenosis.   5. The aortic valve is tricuspid. Aortic valve regurgitation is trivial.  No aortic stenosis is present.   6. The inferior vena cava is normal in size with greater than 50%  respiratory variability, suggesting right atrial pressure of 3 mmHg.    Comparison(s): No prior Echocardiogram.    OV 10/24/2022  Subjective:  Patient ID: [name], female , DOB: Mar 22, 1951 , age 35 y.o. , MRN: [id number] , ADDRESS: [address] [place] [address] PCP Copland, Harlene BROCKS, MD Patient Care Team: Copland, Harlene BROCKS, MD as PCP - General (Family Medicine) Tobie Tonita POUR, DO as Consulting Physician (Neurology) Mathew Carliss SQUIBB, MD Valera Vola Bucco, MD as Referring Physician (Rheumatology) [name] Mitchell Atlas, MD as Referring Physician (Pulmonary Disease) Cleotilde Ronal RAMAN, MD as Consulting Physician (Gynecology) [name] Llanos, MD as Referring Physician (Internal Medicine) Joshua Blamer, MD as Consulting Physician (Dermatology) Cleotilde Ronal RAMAN, MD as Consulting Physician (Gynecology) Pyrtle, Gordy HERO, MD as Consulting Physician (Gastroenterology) Timmy Maude JONELLE, MD as Medical Oncologist (Oncology)  This Provider  for this visit: Treatment Team:  Attending Provider: Geronimo Amel, MD    10/24/2022 -   Chief Complaint  Patient presents with   Follow-up    Review PFT from today   #Scleroderma systemic # ILD associated with scleroderma workup and evaluation progress # Scleroderma renal and respiratory crisis late 2023/early 2024 following hold on CellCept due to shingles  HPI [name] 73 y.o. -returns for follow-up.  She presents with her husband.  Husband is an independent historian today.  The following is and amalgamation of her information and review of the chart and also the husband.  I saw her 2 months ago.  Several issues at this visit  -Renal: She has now established with our local nephrology practice with Dr. Rayburn.  She saw his nurse practitioner yesterday.  Apparently her creatinine has improved.  Most recent one end of March 2024 was 1.39 mg percent.  She believes it is improved because she is back on CellCept for her scleroderma and also she is on antihypertensives  -Blood pressure: She tried  Norvasc  2.5 mg along with lisinopril .  She felt the combination made her hypotensive.  She attributed most of the drop to the 2.5 mg Norvasc .  But even on full dose lisinopril  40 mg/day her blood pressure was still on the lower side.  She says she gets fatigued with the blood pressure is less than 108 systolic and does better when the blood pressure is over 118 systolic.  Therefore she is reduced her lisinopril  to 20 mg/day and with this the blood pressure is in a better range and she is feeling better  -New issue swollen tongue: She has been noticing that her tongue has been swollen for the last few weeks.  This is definitely after starting lisinopril  but she is not associating this with lisinopril .  There is no angioedema of the lips.  When I examined her tongue it looked normal but she says it is swollen.  Swallowing pills has become harder.  I did indicate to her that swollen tongue is a side effect of lisinopril .  We discussed about Norvasc  being a better agent for blood pressure control particularly with her and not.  However she feels that she may be sensitive to the Norvasc .  She is also worried about stopping her lisinopril  due to fear of renal compromise.  Finally took a shared decision making for her to stop lisinopril  for 1 week and see if the swollen tongue will improve.  Also indicated to her that she could continue the lisinopril  but continue to monitor her tongue  -Cough: This is significantly improved after reducing the lisinopril  and stopping Valtrex .  But she still has cough that when it happens is at least moderate in severity   -Shingles: She stopped the Valtrex  because of cough and has been no recurrence in shingles.  She is also back on the CellCept.  The situation is being monitored  -Scleroderma: She does not have a local rheumatologist.  Rheumatology support is through Dr. Maree at [facility] and also Dr. Fredrica at [facility].  Dr. Fredrica and I plan to have a conference call on  10/25/2022.  She is currently on CellCept 50 mg twice daily and is tolerating it well  # ILD: She never been given a formal diagnosis of ILD but she does have it.  I had Dr. Rea Marc review her CT scan of the chest.  She agrees that patient has ILD at least at the 10-20% level.  There is also some pulmonary edema.  Currently most recent pulmonary function test is improved significantly but the overall trend Is 1 of decline particularly this happene afterd she stopped her CellCept in 2023 (see below].  I did indicate to her nintedanib is indicated.  She told me she is sensitive to medications.  I went over the side effect profile of diarrhea, liver function test monitoring rare heart attack diverticulitis.  We took a shared decision making to start the low-dose but pending conversation with Dr. Fredrica at [facility]. ->  The following day on Friday I did speak to his Dr. Fredrica at [facility].  He wants to hold off on the nintedanib pending his conversation with the rheumatologist at [name].  They want to consider IVIG to improve lung function further before starting patient on nintedanib.  He is going to get back to me.  # At risk pulmonary hypertension:-Dr. Vina to do a right heart catheterization there is no tamponade.  Main pulmonary artery pressure was 24 suggestive and consistent with very mild pulmonary hypertension..For treatment for WHO group 3 pulmonary hypertension his mean pulmonary pressure 25 which is just 1 point higher.  Given the pressing issue of ILD decided to postpone pulmonary hypertension treatment.  I will confirm alignment with Dr. Fredrica at [facility] -> I did talk to him on the phone following this.  He is supportive and aligned with this plan.  #Pericardial effusion: Dr. Vina feels it is inflammatory and will use with treatment this should resolve.   P Preliminary result   [facility] MID- MAR 2024   Findings:   RA = 6 RV = 39/12 PA = 39/10 (24) PCW = 6 Fick cardiac  output/index = 5.7/3.4 PVR = 3.2 WU Ao sat = 99% PA sat = 74%, 77% SVC sat 75%   Assessment:   Very mild PAH.    Plan/Discussion:    Given scleroderma, will discuss trial of ERA or Tyvaso with Dr. Geronimo.   Toribio Fuel, MD   OV 11/12/2022  Subjective:  Patient ID: [name], female , DOB: 1951-02-07 , age 59 y.o. , MRN: [id number] , ADDRESS: [address] [place] [address] PCP Copland, Harlene BROCKS, MD Patient Care Team: Copland, Harlene BROCKS, MD as PCP - General (Family Medicine) Tobie Tonita POUR, DO as Consulting Physician (Neurology) Mathew Carliss SQUIBB, MD Valera Vola Bucco, MD as Referring Physician (Rheumatology) [name] Mitchell Atlas, MD as Referring Physician (Pulmonary Disease) Cleotilde Ronal RAMAN, MD as Consulting Physician (Gynecology) [name] Llanos, MD as Referring Physician (Internal Medicine) Joshua Blamer, MD as Consulting Physician (Dermatology) Cleotilde Ronal RAMAN, MD as Consulting Physician (Gynecology) Pyrtle, Gordy HERO, MD as Consulting Physician (Gastroenterology) Timmy Maude JONELLE, MD as Medical Oncologist (Oncology)  This Provider for this visit: Treatment Team:  Attending Provider: Geronimo Amel, MD    11/12/2022 -   Chief Complaint  Patient presents with   Follow-up    F/up     Type of visit: Video Virtual Visit Identification of patient [name] with 04-10-1951 and MRN [id number] - 2 person identifier Risks: Risks, benefits, limitations of telephone visit explained. Patient understood and verbalized agreement to proceed Anyone else on call: patient Patient location: her home This provider location: [address]; [place]; [place] [address]. [facility]. [phone]     HPI ZHANIYA SWALLOWS 73 y.o. -patient on video visit to catch up  SSc/ILD - stable  / Dw Dr Algie [name] of [name] over telephine ->  she is worried about myopathy. her CK continues to be high.  It is in the 500s.And is recommending  IvIG. Her impression is that Dr Daril Fairly at [facility] has made plans to get in [place] through her patient hematologist. DR Fairly is asking for repeat FVC and MIP/NIF.  Patient is going to call [employer] and figure this out.   Tongue swelling: still remain on lisinprol 20mg  per day . Was advised to strt B12 and this helped. This was based on based on advise given by daughter friends.  BP: She is off norvasc . Is now on lisinopril  20mg  per day. SBP 91 and sometimes sbp 80s  . Scared to cut down on lisinopril  because of beneifical effects to kidney. Says low BP is making her ddizzy and fatigued and difficult to carry on.d/w Dr Jennifer Fairly o  [name] rheum via phone =- > she said for us  to take BP and if still low -> then reduce lisinopril  to 10mg  per day  Renal: creat 1.38g% on 10/28/22 but after that with [facility] is 1.34mg %   Left diaph - sniff test: Normal      OV 07/22/2023  Subjective:  Patient ID: [name], female , DOB: 29-Aug-1950 , age 23 y.o. , MRN: [id number] , ADDRESS: [address] [place] [address] PCP Copland, Harlene BROCKS, MD Patient Care Team: Copland, Harlene BROCKS, MD as PCP - General (Family Medicine) Tobie Tonita POUR, DO as Consulting Physician (Neurology) Mathew Carliss SQUIBB, MD Valera Vola Bucco, MD as Referring Physician (Rheumatology) [name] Mitchell Atlas, MD as Referring Physician (Pulmonary Disease) Cleotilde Ronal RAMAN, MD as Consulting Physician (Gynecology) Fairly Daril, MD as Referring Physician (Internal Medicine) Joshua Blamer, MD as Consulting Physician (Dermatology) Cleotilde Ronal RAMAN, MD as Consulting Physician (Gynecology) Pyrtle, Gordy HERO, MD as Consulting Physician (Gastroenterology) Timmy Maude JONELLE, MD as Medical Oncologist (Oncology)  This Provider for this visit: Treatment Team:  Attending Provider: Geronimo Amel, MD    07/22/2023 -   Chief Complaint  Patient presents with   Follow-up    Pt states she has no concerns been doing good.     HPI [name] 73 y.o. -returns with her husband Zell.  Last seen in the spring 2024.  After that she is followed up at [facility] and [place].  D saw her primary scleroderma providers.  She has been started on IVIG.  She gets it every 4 weeks.  She gets it locally through Dr. Maude and ever at [facility] which is closer to her house.  She is finished 7 cycles.  She gets it every 4 weeks.  The next cycle of cycle #8 and then extending it to every 6 weeks where she will get 3 cycles like that and then go to every 8 weeks.  She says she is feeling remarkably better.  She has got no shortness of breath.  Symptom scores are 0.  She is working with physical therapy at home with dumbbells and other weights.  Her mobility is much better.  [Of note she used to just exercise and at some point she is hoping to return to work].  She is followed up at [facility] and she had pulmonary function test that shows the [facility] is improving.  Recently her CK is also converted to normal as of November 2024.  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  She is unsure about her pulmonary fibrosis status but she does not have any shortness of breath and she  did not desaturate.  She still has some crackles though on exam.  Her last CT scan was in February 2024 at [facility].  We have resolved that she will see me back in 6 months with pulmonary function test and then we can decide on CT scan.  Should also discussed the history of pulmonary fibrosis with the [facility] and [employer].  In case there is progression I did not explain to her that antifibrotic's or clinical trials are always care options.  At this point in time I do not recommend those.  She continues to have intermittent dysphagia.  She recently saw Dr. JINNY Starch.  She is going to see him again soon.  She did have a swallow study.    OV 03/08/2024  Subjective:  Patient ID: [name], female , DOB: [birth year] , age 50 y.o. , MRN: [id number] , ADDRESS: [address] [place]  [place] [address] PCP Copland, Harlene BROCKS, MD Patient Care Team: Copland, Harlene BROCKS, MD as PCP - General (Family Medicine) Tobie Tonita POUR, DO as Consulting Physician (Neurology) Mathew Carliss SQUIBB, MD Valera Vola Bucco, MD as Referring Physician (Rheumatology) [name] Mitchell Atlas, MD as Referring Physician (Pulmonary Disease) Cleotilde Ronal RAMAN, MD as Consulting Physician (Gynecology) [name] Llanos, MD as Referring Physician (Internal Medicine) Joshua Blamer, MD as Consulting Physician (Dermatology) Cleotilde Ronal RAMAN, MD as Consulting Physician (Gynecology) Pyrtle, Gordy HERO, MD as Consulting Physician (Gastroenterology) Timmy Maude JONELLE, MD as Medical Oncologist (Oncology)  This Provider for this visit: Treatment Team:  Attending Provider: Geronimo Amel, MD    03/08/2024 -   Chief Complaint  Patient presents with   Follow-up    ILD f/u Patient states she is doing good.      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 -returns for follow-up.  Presents with her husband.  This a 76-month follow-up but it ended up being 7-8 months.  Since her last visit no hospitalizations.  She says the IVIG has been effective and that it has normalized her CK.  The CellCept is also effective and that it is normalized her creatinine.  These 2 as of August 2025.  Her pulmonary function test also shows an improvement in [facility] but this was at [facility] in  March 2025 although there might be some plateauing of the DLCO.  From a respiratory standpoint she does feel well with very minimal and asymptomatic.  She did work with the [employer].  She did not desaturate with exertion here.  However her quality of life continues to be an issue she is dealing with diarrhea.  In addition the IVIG according to her is causing side effects.  She is now getting IVIG infusion every 8 to 12 weeks.  She is getting 5 sessions each week each session is 1 day and is 5 hours long.  When the infusion goes fast  she gets headache.  A day or 2 after the infusion she starts getting cough and fatigue and decreased appetite and this lasts for few weeks and the further she gets away from the infusion things are better.  She met with Dr. Maree (Ankoor).  [facility] on March 02, 2024 and I reviewed the notes.  There are plans to see if they should pause the IVIG.  He is going to talk to DR Jennifer [name] and come to a consensus on this.  She wanted know the date on IVIG.  I did have some literature review with me and I did share this with her.  Julie is on there is a couple minutes.  Homophobic is  She has upcoming appointment November 2025 at [facility].   External records reviewed Husband is with her and he is independent historian and is echoing a lot of the history.     SYMPTOM SCALE - ILD 09/05/2022 07/22/2023 Cellcept Iv IG 03/08/2024 Cellcept ivIG  Current weight     O2 use ra ra ra  Shortness of Breath 0 -> 5 scale with 5 being worst (score 6 If unable to do)    At rest 1 0 0  Simple tasks - showers, clothes change, eating, shaving 1 0 0  Household (dishes, doing bed, laundry) 2 0 0  Shopping x 0 0  Walking level at own pace 1 0 0  Walking up Stairs 2 0 0  Total (30-36) Dyspnea Score 7 0 0      Non-dyspnea symptoms (0-> 5 scale) 09/05/2022 07/22/2023  03/08/2024   How bad is your cough? 3 1 2.5  How bad is your fatigue 3 0 1  How bad is nausea 3 0 0   How bad is vomiting?  0 0 0  How bad is diarrhea? 0 0 1.5  How bad is anxiety? 0 0 0  How bad is depression 0 0 0  Any chronic pain - if so where and how bad 1 0 0  0   PFT data for ILD 12/23/2017 2.25/2020 03/07/2020 11/14/20 [place] 05/15/21 05/14/2022 at [facility]  08/27/22 at [facility]  In the context of volume overload and hospitalization 10/23/2022  [name] 12/11/22 [name] [place] 04/08/23 [name] [place] 3?2525  FVC 2.82 2.92 2.88 2.77 2.75 2.05 1.59 1.73 2.02L 2.18L 2.45  FVC %      73% 60% 58%     Ratio      77 76      TLC   3.93  4.34 3.09 2.41    3.24  TLC %      62% 51%      DLCO 19.2 10.67 11.68 12.29 12.82 10.2 5.38 9.27   8.24  DLCO %      52% 29% 47%       PFT     Latest Ref Rng & Units 11/29/2022    3:03 PM 10/23/2022    4:24 PM  PFT Results  FVC-Pre L 1.99  1.73   FVC-Predicted Pre % 68  58   Pre FEV1/FVC % % 80  85   FEV1-Pre L 1.59  1.47   FEV1-Predicted Pre % 73  65   DLCO uncorrected ml/min/mmHg 7.58  8.78   DLCO UNC% % 39  44   DLCO corrected ml/min/mmHg  9.27   DLCO COR %Predicted %  47   DLVA Predicted % 66  81   TLC L 3.28    TLC % Predicted %  64    RV % Predicted % 63        Simple office walk 185 feet x  3 laps goal with forehead probe 09/05/2022  10/24/2022   O2 used ra ra  Number laps completed 3 Sit stana x 15  Comments about pace pace   Resting Pulse Ox/HR 100% and 87/min 100% and HR 78  Final Pulse Ox/HR 97% and 107/min 99% and Hr 91  Desaturated </= 88% no   Desaturated <= 3% points yes   Got Tachycardic >/= 90/min yes   Symptoms at end of test x   Miscellaneous comments x           SIT STAND TEST - goal 15 times   03/08/2024    O2 used ra   PRobe - finter or forehead forehead   Number sit and stand completed - goal 15 15   Time taken to complete 44 sec   Resting Pulse Ox/HR/Dyspnea  99% and 81/min and dyspnea of 0/10    Peak measures 100 % and 105/min and dyspnea of 0/10   Final Pulse Ox/HR 100% and  102/min and dyspnea of 0/10   Desaturated </= 88% no   Desaturated <= 3% points no   Got Tachycardic >/= 90/min yes   Miscellaneous comments x       LAB RESULTS last 96 hours No results found.       has a past medical history of Anemia, Breastr cancer, IDC, Left UOQ, clinical stage II Receptor +, Her 2 - (08/29/2011 DX), Chronic renal failure (CRF), stage 3a (HCC) (11/18/2022), Colon polyp, Contracture of hand, Depression, Esophageal stricture, GERD (gastroesophageal reflux disease), Hiatal hernia, History of external beam radiation therapy (11-25-2011 to 01-08-2012), Hyperlipidemia, Hypertension, Hypothyroidism, Internal hemorrhoids, Osteoarthritis, Osteopenia, Positive ANA (antinuclear antibody), Rash, Raynaud's syndrome, Scleroderma involving lung (HCC) (pulmologist-  dr h. [name] at [facility]), Skin thickening, Systemic sclerosis (HCC), and UTI (urinary tract infection).   reports that she has never smoked. She has never used smokeless tobacco.  Past Surgical History:  Procedure Laterality Date   BIOPSY  01/29/2018   Procedure: BIOPSY;  Surgeon: Albertus Gordy HERO, MD;  Location: [facility];  Service: Gastroenterology;;   ROMAYNE  2000   Left   COLONOSCOPY WITH PROPOFOL  N/A 01/29/2018   Procedure: COLONOSCOPY WITH PROPOFOL ;  Surgeon: Albertus Gordy HERO, MD;  Location: [facility];  Service: Gastroenterology;  Laterality: N/A;   DILATATION & CURETTAGE/HYSTEROSCOPY WITH MYOSURE N/A 08/04/2017   Procedure: DILATATION & CURETTAGE/HYSTEROSCOPY WITH MYOSURE;  Surgeon: Cleotilde Ronal RAMAN, MD;  Location: [facility];  Service: Gynecology;  Laterality: N/A;   ECTOPIC PREGNANCY SURGERY  1986-88   s/p unilateral salpingectomy   ESOPHAGOGASTRODUODENOSCOPY (EGD) WITH PROPOFOL  N/A 01/29/2018   Procedure: ESOPHAGOGASTRODUODENOSCOPY (EGD) WITH PROPOFOL ;  Surgeon: Albertus Gordy HERO, MD;  Location: [facility];  Service: Gastroenterology;  Laterality: N/A;   IR IMAGING GUIDED PORT INSERTION   12/02/2022   PARTIAL MASTECTOMY WITH AXILLARY SENTINEL LYMPH NODE BIOPSY Left 11-04-2011   dr merrilyn  [facility]   RIGHT HEART CATH N/A 10/01/2022   Procedure: RIGHT HEART CATH;  Surgeon: Cherrie Toribio SAUNDERS, MD;  Location: [facility] INVASIVE CV LAB;  Service: Cardiovascular;  Laterality: N/A;   TRANSTHORACIC ECHOCARDIOGRAM  06-04-2017    [facility]   moderate LVH, ef>55%/  trivial AR and TR/ mild MR and PR/  trivial pericardial effusion   WISDOM TOOTH EXTRACTION      Allergies  Allergen Reactions   Betadine [Povidone Iodine] Rash  Epinephrine Other (See Comments)    Severe headaches    Iodinated Contrast Media Other (See Comments) and Rash    per pt: recently had CT 08/ 2018 even through given pre-medication, IVP still caused rash  per pt recently had CT 08/ 2018 even through given pre-medication, IVP still caused rash   Iodine Rash and Other (See Comments)   Oxycodone  Anxiety   Prednisone  Anaphylaxis    Per Nephrology pt should avoid this medication because she has Scleroderma and it will interfere with her Kidneys,    Gabapentin Other (See Comments)    Dizziness   Lidocaine  Rash   Nickel Rash and Other (See Comments)    Rash and blisters   Other Other (See Comments) and Rash    Hospital gown  Product containing penicillin (product)  Substance with sulfonamide structure and antibacterial mechanism of action (substance)   Penicillins Rash and Other (See Comments)    Rash and blisters Has patient had a PCN reaction causing immediate rash, facial/tongue/throat swelling, SOB or lightheadedness with hypotension: Yes Has patient had a PCN reaction causing severe rash involving mucus membranes or skin necrosis: No Has patient had a PCN reaction that required hospitalization: No Has patient had a PCN reaction occurring within the last 10 years: No If all of the above answers are NO, then may proceed with Cephalosporin use.    Sulfa Antibiotics Rash    Immunization History  Administered  Date(s) Administered   Tdap 04/12/2008, 12/28/2018   Zoster, Live 08/16/2011    Family History  Problem Relation Age of Onset   Heart disease Mother    Heart failure Mother    Heart disease Father    Ovarian cancer Maternal Aunt    Heart disease Paternal Uncle        multiple   Autoimmune disease Sister    Arrhythmia Sister    Anemia Sister    CAD Brother    Colon cancer Neg Hx    Stomach cancer Neg Hx    Esophageal cancer Neg Hx    Rectal cancer Neg Hx      Current Outpatient Medications:    acetaminophen  (TYLENOL ) 650 MG CR tablet, Take 650 mg by mouth daily as needed for pain., Disp: , Rfl:    benzonatate  (TESSALON ) 100 MG capsule, Take 1 capsule (100 mg total) by mouth 3 (three) times daily as needed for cough., Disp: 30 capsule, Rfl: 3   diphenhydrAMINE  (BENADRYL ) 25 mg capsule, Take 25 mg by mouth every 6 (six) hours as needed for allergies. Allergies and before IVIG treatment., Disp: , Rfl:    ELDERBERRY PO, Take 1 oz by mouth 4 (four) times daily as needed. Liquid, Disp: , Rfl:    furosemide  (LASIX ) 20 MG tablet, TAKE 1 TABLET(20 MG) BY MOUTH DAILY, Disp: 90 tablet, Rfl: 0   losartan  (COZAAR ) 25 MG tablet, Take 25 mg by mouth in the morning and at bedtime., Disp: , Rfl:    Multiple Vitamin (MULTIVITAMIN) capsule, Take 1 capsule by mouth daily., Disp: , Rfl:    mupirocin ointment (BACTROBAN) 2 %, Apply within both nasal passages twice daily for twice daily., Disp: , Rfl:    mycophenolate (CELLCEPT) 500 MG tablet, Take 1,500 mg by mouth 2 (two) times daily., Disp: , Rfl:    sodium chloride  (OCEAN) 0.65 % SOLN nasal spray, Place 1 spray into both nostrils as needed for congestion., Disp: , Rfl:    SYNTHROID  100 MCG tablet, Take 1 tablet (100 mcg total) by  mouth daily before breakfast., Disp: 90 tablet, Rfl: 3   Vonoprazan Fumarate  (VOQUEZNA ) 10 MG TABS, Take 1 tablet by mouth daily., Disp: 30 tablet, Rfl: 1   predniSONE  (DELTASONE ) 5 MG tablet, 1 tablet Orally bid with food;  Duration: 5 days (Patient not taking: Reported on 03/08/2024), Disp: , Rfl:    valACYclovir  (VALTREX ) 500 MG tablet, 1 tablet Orally tid with full glass of water; Duration: 5 days (Patient not taking: Reported on 03/08/2024), Disp: , Rfl:       Objective:   Vitals:   03/08/24 1557  BP: 106/68  Pulse: 77  SpO2: 100%  Weight: 133 lb 9.6 oz (60.6 kg)  Height: 5' 4 (1.626 m)    Estimated body mass index is 22.93 kg/m as calculated from the following:   Height as of this encounter: 5' 4 (1.626 m).   Weight as of this encounter: 133 lb 9.6 oz (60.6 kg).  @WEIGHTCHANGE @  [employer]   03/08/24 1557  Weight: 133 lb 9.6 oz (60.6 kg)     Physical Exam   General: No distress. Looks well O2 at rest: no Cane present: no Sitting in wheel chair: no Frail: no Obese: no Neuro: Alert and Oriented x 3. GCS 15. Speech normal Psych: Pleasant Resp:  Barrel Chest - no.  Wheeze - no, Crackles - YES, No overt respiratory distress CVS: Normal heart sounds. 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 PEERL +. No post nasal drip        Assessment/     Assessment & Plan Interstitial lung disease due to connective tissue disease (HCC)  Diffuse systemic sclerosis (HCC)  Myopathy  Elevated CK  High risk medication use    PLAN Patient Instructions     ICD-10-CM   1. Interstitial lung disease due to connective tissue disease (HCC)  J84.89 Pulmonary function test   M35.9     2. Diffuse systemic sclerosis (HCC)  M34.9 Pulmonary function test    3. 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 Elevated CK  R74.8 Pulmonary function test    5. High risk medication use  Z79.899         Scleroderma interstitial lung disease  -Clinically stable since restart of CellCept - PFT improved at [facility] as of March 2025 ; next PFT In Nov 2025 - feeling stable  Plan  - CellCept directivee by [facility] and [employer] -  keep appt with them Nov 2025 2025 - do spiro and dlco here in 6 months (feb 2026)  - if there is progression, consider HRCT there is oral ofev and inhaled clinical trials available  Myopathy/elevated CK  - IVIG given locally in [facility] through your hematologist as directed by [facility]  - this is helping you and CK dec 2024 and Aug 2025 is normal  - However, severe side effects from IvIG  Plan - Noted  POTENTIAL plans to hold IvIG by [facility]/[name]  - CK and PFT monitoring through [facility] [place] with blood work through Dr Timmy her locally - emailed you ivIG literature  Acute scleroderma renal crisis [facility]) -January 2024 in the setting of stopping CellCept because of shingles   -Creatinine in dec 2024 is1.15mg  % and 1.1mg % in Aug 2025 significantly better since early 2024  Plan -According to renal    Follow-up (shared decision making) -6 months face-to-face visit with Dr. Geronimo ; 30 min    FOLLOWUP    Return  in about 6 months (around 09/08/2024) for 30 min visit, after Spiro and DLCO, with Dr Geronimo, Face to Face Visit.    SIGNATURE    Dr. Dorethia Geronimo, M.D., F.C.C.P,  Pulmonary and Critical Care Medicine Staff Physician, [facility] Director - Interstitial Lung Disease  Program  Pulmonary Fibrosis [facility] at [facility] [place], [place], [address]  Pager: [phone], If no answer or between  15:00h - 7:00h: call [phone] Telephone: [phone]  5:29 PM 03/08/2024

## 2024-03-10 ENCOUNTER — Ambulatory Visit (INDEPENDENT_AMBULATORY_CARE_PROVIDER_SITE_OTHER): Admitting: Internal Medicine

## 2024-03-10 ENCOUNTER — Encounter: Payer: Self-pay | Admitting: Internal Medicine

## 2024-03-10 ENCOUNTER — Telehealth: Payer: Self-pay | Admitting: Internal Medicine

## 2024-03-10 VITALS — BP 106/62 | HR 78 | Ht 64.0 in | Wt 133.4 lb

## 2024-03-10 DIAGNOSIS — M349 Systemic sclerosis, unspecified: Secondary | ICD-10-CM

## 2024-03-10 DIAGNOSIS — K2100 Gastro-esophageal reflux disease with esophagitis, without bleeding: Secondary | ICD-10-CM | POA: Diagnosis not present

## 2024-03-10 DIAGNOSIS — K521 Toxic gastroenteritis and colitis: Secondary | ICD-10-CM

## 2024-03-10 DIAGNOSIS — R197 Diarrhea, unspecified: Secondary | ICD-10-CM

## 2024-03-10 MED ORDER — DIPHENOXYLATE-ATROPINE 2.5-0.025 MG PO TABS: 1.0000 | ORAL_TABLET | Freq: Three times a day (TID) | ORAL | 1 refills | Status: DC | PRN

## 2024-03-10 NOTE — Telephone Encounter (Signed)
 Called and spoke with pharmacist tech and she states the pharmacist did not leave a message on the patient's prescription. She said she will have the pharmacist call me if she needs to speak to me.

## 2024-03-10 NOTE — Telephone Encounter (Signed)
 [facility] pharmacy calling in regards to lomotil  prescription. [phone] and choose the subscriber line.

## 2024-03-10 NOTE — Patient Instructions (Addendum)
 Continue Voquezna  10 mg daily.   We have sent the following medications to your pharmacy for you to pick up at your convenience: Lomotil .   _______________________________________________________  If your blood pressure at your visit was 140/90 or greater, please contact your primary care physician to follow up on this.  _______________________________________________________  If you are age 75 or older, your body mass index should be between 23-30. Your Body mass index is 22.89 kg/m. If this is out of the aforementioned range listed, please consider follow up with your Primary Care Provider.  If you are age 64 or younger, your body mass index should be between 19-25. Your Body mass index is 22.89 kg/m. If this is out of the aformentioned range listed, please consider follow up with your Primary Care Provider.   ________________________________________________________  The [facility] providers would like to encourage you to use MYCHART to communicate with providers for non-urgent requests or questions.  Due to long hold times on the telephone, sending your provider a message by [facility] may be a faster and more efficient way to get a response.  Please allow 48 business hours for a response.  Please remember that this is for non-urgent requests.  _______________________________________________________  [facility] is using a team-based approach to care.  Your team is made up of your doctor and two to three APPS. Our APPS (Nurse Practitioners and Physician Assistants) work with your physician to ensure care continuity for you. They are fully qualified to address your health concerns and develop a treatment plan. They communicate directly with your gastroenterologist to care for you. Seeing the Advanced Practice Practitioners on your physician's team can help you by facilitating care more promptly, often allowing for earlier appointments, access to diagnostic testing, procedures, and  other specialty referrals.

## 2024-03-10 NOTE — Progress Notes (Signed)
 Subjective:    Patient ID: [name], female    DOB: Nov 29, 1950, 73 y.o.   MRN: [id number]  HPI Holliday Sheaffer is a 73 year old female with a history of scleroderma with systemic sclerosis, GERD with reflux esophagitis and hiatal hernia, esophageal stricture, history of nonadvanced adenomatous polyps, prior breast cancer, hypothyroidism, hyperlipidemia who is here for follow-up. She is here today with her husband.   She has a history of GERD with esophagitis and was previously on vonoprazan 20 mg daily, which was reduced to 10 mg daily in May. The lower dose has been better tolerated, with fewer side effects and no significant increase in esophageal symptoms. She experienced heartburn after discontinuing the medication temporarily, which resolved upon resuming it. She has been back on vonoprazan for about a week and is due for a prescription renewal.  She is undergoing treatment for myopathy and elevated creatine kinase (CK) levels with intravenous immunoglobulin (IVIG). She experiences significant side effects from the IVIG, including a lack of appetite, digestive issues, cough, and low energy levels, impacting her quality of life. She feels unwell for seven out of ten weeks following infusions. She is currently in the second week post-infusion and is scheduled for her next infusion in twelve weeks.  Her rheumatologist at [facility] will discuss soon with her rheumatologist at [facility] and hematologist oncologist here in town to determine if IVIG will continue or change.  She reports gastrointestinal symptoms including diarrhea and loose stools, which she manages with dietary adjustments and occasional use of Kaopectate. She has reduced her intake of Orgain protein shakes to half a serving to mitigate these symptoms. She also uses a probiotic supplement, which she feels has been beneficial.  During the review of symptoms, she experiences heartburn, some difficulty swallowing, and multiple  bowel movements per day, which are sometimes loose. No constipation or accidents. Bowel movements are more regular in the morning and become looser as the day progresses.  She has an upcoming anniversary and birthday trip in October to [place] where they will spend 10 days in [place] with some of their friends.  Review of Systems As per HPI, otherwise negative  Current Medications, Allergies, Past Medical History, Past Surgical History, Family History and Social History were reviewed in [employer] record.     Objective:   Physical Exam BP 106/62   Pulse 78   Ht 5' 4 (1.626 m)   Wt 133 lb 6 oz (60.5 kg)   BMI 22.89 kg/m  Gen: awake, alert, NAD Neuro: nonfocal      Assessment & Plan:   Gastroesophageal reflux disease with esophagitis GERD with esophagitis managed with 10 mg vonoprazan. Symptoms seem to be controlled on lower dose. Heartburn recurred after discontinuation, indicating need for acid suppression.  This decision also supported by moderate esophagitis as seen by EGD.  Current regimen effective without significant side effects. - Continue vonoprazan 10 mg daily. - Provide samples of vonoprazan to bridge until prescription is renewed. - Can consider repeat EGD as needed to assess disease management  Intermittent diarrhea, likely related to IVIG Kaopectate and probiotics provided some relief. Discussed Lomotil  for symptomatic relief on bad days or when traveling. Probiotics may provide benefit. - Prescribe Lomotil  1 tablet 3 times daily as needed for symptomatic relief of diarrhea as needed. - Continue current probiotic regimen if beneficial.  Scleroderma with systemic sclerosis Follows closely with Naomi Bud, [facility] rheumatology and Dr. Timmy here in town.  Also follows with pulmonology for  scleroderma associated with ILD  History of colonic polyps Adenomatous polyps in 2016 done in 2019 -Colonoscopy interval would be 5 to 10 years from 2019,  deferred for now based on patient preference  40-month follow-up  30 minutes total spent today including patient facing time, coordination of care, reviewing medical history/procedures/pertinent radiology studies, and documentation of the encounter.

## 2024-03-16 ENCOUNTER — Other Ambulatory Visit: Payer: Self-pay

## 2024-03-16 MED ORDER — DIPHENOXYLATE-ATROPINE 2.5-0.025 MG PO TABS: 1.0000 | ORAL_TABLET | Freq: Three times a day (TID) | ORAL | 1 refills | Status: AC | PRN

## 2024-03-16 NOTE — Telephone Encounter (Signed)
 Patient states [facility] told her they never received the prescription. Informed patient I did fax the prescription on 03/10/24 and did receive a confirmation. Informed patient I will fax it again to [facility].   Fax went through and received confirmation for Lomotil  prescription.

## 2024-03-16 NOTE — Telephone Encounter (Signed)
 Inbound call from patient states she is suppose to have a prescription for diarrhea. Please advise.

## 2024-03-17 ENCOUNTER — Telehealth: Payer: Self-pay | Admitting: Internal Medicine

## 2024-03-17 NOTE — Telephone Encounter (Signed)
 Inbound call from pharmacy stating they have not received fax prescription but will accept a verbal one or will wait for another faxed prescription for medication Lomotil .  Please advise  Thank you

## 2024-03-17 NOTE — Telephone Encounter (Signed)
 Called and spoke with Kiara at [facility] and gave verbal order for Lomotil  prescription.

## 2024-03-19 ENCOUNTER — Ambulatory Visit: Payer: Self-pay | Admitting: Hematology & Oncology

## 2024-03-19 ENCOUNTER — Other Ambulatory Visit: Payer: Self-pay

## 2024-03-19 ENCOUNTER — Inpatient Hospital Stay: Attending: Hematology & Oncology

## 2024-03-19 ENCOUNTER — Inpatient Hospital Stay

## 2024-03-19 DIAGNOSIS — N1831 Chronic kidney disease, stage 3a: Secondary | ICD-10-CM | POA: Diagnosis not present

## 2024-03-19 DIAGNOSIS — D631 Anemia in chronic kidney disease: Secondary | ICD-10-CM | POA: Insufficient documentation

## 2024-03-19 DIAGNOSIS — I129 Hypertensive chronic kidney disease with stage 1 through stage 4 chronic kidney disease, or unspecified chronic kidney disease: Secondary | ICD-10-CM | POA: Insufficient documentation

## 2024-03-19 DIAGNOSIS — M3500 Sicca syndrome, unspecified: Secondary | ICD-10-CM

## 2024-03-19 DIAGNOSIS — M349 Systemic sclerosis, unspecified: Secondary | ICD-10-CM

## 2024-03-19 DIAGNOSIS — Z79899 Other long term (current) drug therapy: Secondary | ICD-10-CM | POA: Diagnosis not present

## 2024-03-19 DIAGNOSIS — Z170 Estrogen receptor positive status [ER+]: Secondary | ICD-10-CM

## 2024-03-19 DIAGNOSIS — C50912 Malignant neoplasm of unspecified site of left female breast: Secondary | ICD-10-CM | POA: Insufficient documentation

## 2024-03-19 LAB — CMP (CANCER CENTER ONLY)
ALT: 11 U/L (ref 0–44)
AST: 24 U/L (ref 15–41)
Albumin: 4 g/dL (ref 3.5–5.0)
Alkaline Phosphatase: 59 U/L (ref 38–126)
Anion gap: 11 (ref 5–15)
BUN: 11 mg/dL (ref 8–23)
CO2: 21 mmol/L — ABNORMAL LOW (ref 22–32)
Calcium: 9.1 mg/dL (ref 8.9–10.3)
Chloride: 104 mmol/L (ref 98–111)
Creatinine: 1.04 mg/dL — ABNORMAL HIGH (ref 0.44–1.00)
GFR, Estimated: 56 mL/min — ABNORMAL LOW (ref >60)
Glucose, Bld: 111 mg/dL — ABNORMAL HIGH (ref 70–99)
Potassium: 3.5 mmol/L (ref 3.5–5.1)
Sodium: 136 mmol/L (ref 135–145)
Total Bilirubin: 0.3 mg/dL (ref 0.0–1.2)
Total Protein: 7.1 g/dL (ref 6.5–8.1)

## 2024-03-19 LAB — LACTATE DEHYDROGENASE: LDH: 190 U/L (ref 98–192)

## 2024-03-19 LAB — CBC WITH DIFFERENTIAL (CANCER CENTER ONLY)
Abs Immature Granulocytes: 0.02 K/uL (ref 0.00–0.07)
Basophils Absolute: 0 K/uL (ref 0.0–0.1)
Basophils Relative: 0 %
Eosinophils Absolute: 0.1 K/uL (ref 0.0–0.5)
Eosinophils Relative: 1 %
HCT: 28.3 % — ABNORMAL LOW (ref 36.0–46.0)
Hemoglobin: 9.1 g/dL — ABNORMAL LOW (ref 12.0–15.0)
Immature Granulocytes: 0 %
Lymphocytes Relative: 9 %
Lymphs Abs: 0.5 K/uL — ABNORMAL LOW (ref 0.7–4.0)
MCH: 29.9 pg (ref 26.0–34.0)
MCHC: 32.2 g/dL (ref 30.0–36.0)
MCV: 93.1 fL (ref 80.0–100.0)
Monocytes Absolute: 0.4 K/uL (ref 0.1–1.0)
Monocytes Relative: 7 %
Neutro Abs: 4.6 K/uL (ref 1.7–7.7)
Neutrophils Relative %: 83 %
Platelet Count: 266 K/uL (ref 150–400)
RBC: 3.04 MIL/uL — ABNORMAL LOW (ref 3.87–5.11)
RDW: 14.7 % (ref 11.5–15.5)
WBC Count: 5.6 K/uL (ref 4.0–10.5)
nRBC: 0 % (ref 0.0–0.2)

## 2024-03-23 ENCOUNTER — Inpatient Hospital Stay

## 2024-03-23 VITALS — BP 124/61 | Temp 97.8°F | Resp 19

## 2024-03-23 DIAGNOSIS — N1831 Chronic kidney disease, stage 3a: Secondary | ICD-10-CM | POA: Diagnosis not present

## 2024-03-23 DIAGNOSIS — Z170 Estrogen receptor positive status [ER+]: Secondary | ICD-10-CM | POA: Diagnosis not present

## 2024-03-23 DIAGNOSIS — C50912 Malignant neoplasm of unspecified site of left female breast: Secondary | ICD-10-CM | POA: Diagnosis not present

## 2024-03-23 DIAGNOSIS — I129 Hypertensive chronic kidney disease with stage 1 through stage 4 chronic kidney disease, or unspecified chronic kidney disease: Secondary | ICD-10-CM | POA: Diagnosis not present

## 2024-03-23 DIAGNOSIS — Z79899 Other long term (current) drug therapy: Secondary | ICD-10-CM | POA: Diagnosis not present

## 2024-03-23 DIAGNOSIS — D631 Anemia in chronic kidney disease: Secondary | ICD-10-CM | POA: Diagnosis not present

## 2024-03-23 DIAGNOSIS — D509 Iron deficiency anemia, unspecified: Secondary | ICD-10-CM

## 2024-03-23 MED ORDER — DARBEPOETIN ALFA 300 MCG/0.6ML IJ SOSY
300.0000 ug | PREFILLED_SYRINGE | Freq: Once | INTRAMUSCULAR | Status: AC
  Administered 2024-03-23: 300 ug via SUBCUTANEOUS
  Filled 2024-03-23: qty 0.6

## 2024-03-23 NOTE — Patient Instructions (Signed)

## 2024-04-02 ENCOUNTER — Telehealth: Payer: Self-pay | Admitting: Internal Medicine

## 2024-04-02 MED ORDER — VOQUEZNA 10 MG PO TABS: 1.0000 | ORAL_TABLET | Freq: Every day | ORAL | 5 refills | Status: AC

## 2024-04-02 NOTE — Telephone Encounter (Signed)
 Inbound call from patient requesting a refill on her Voquezna  and would like for it to be sent to [facility]. Please advise.

## 2024-04-02 NOTE — Telephone Encounter (Signed)
 Rx for Voquezna  10mg  sent to [facility] as requested.

## 2024-04-16 ENCOUNTER — Other Ambulatory Visit: Payer: Self-pay

## 2024-04-16 DIAGNOSIS — M349 Systemic sclerosis, unspecified: Secondary | ICD-10-CM

## 2024-04-16 DIAGNOSIS — C50412 Malignant neoplasm of upper-outer quadrant of left female breast: Secondary | ICD-10-CM

## 2024-04-19 ENCOUNTER — Other Ambulatory Visit: Payer: Self-pay

## 2024-04-19 ENCOUNTER — Inpatient Hospital Stay: Attending: Hematology & Oncology

## 2024-04-19 ENCOUNTER — Ambulatory Visit

## 2024-04-19 ENCOUNTER — Inpatient Hospital Stay

## 2024-04-19 DIAGNOSIS — Z79624 Long term (current) use of inhibitors of nucleotide synthesis: Secondary | ICD-10-CM | POA: Insufficient documentation

## 2024-04-19 DIAGNOSIS — N1831 Chronic kidney disease, stage 3a: Secondary | ICD-10-CM | POA: Diagnosis not present

## 2024-04-19 DIAGNOSIS — R978 Other abnormal tumor markers: Secondary | ICD-10-CM | POA: Diagnosis not present

## 2024-04-19 DIAGNOSIS — D509 Iron deficiency anemia, unspecified: Secondary | ICD-10-CM

## 2024-04-19 DIAGNOSIS — M3481 Systemic sclerosis with lung involvement: Secondary | ICD-10-CM | POA: Insufficient documentation

## 2024-04-19 DIAGNOSIS — C50912 Malignant neoplasm of unspecified site of left female breast: Secondary | ICD-10-CM | POA: Insufficient documentation

## 2024-04-19 DIAGNOSIS — Z1732 Human epidermal growth factor receptor 2 negative status: Secondary | ICD-10-CM | POA: Insufficient documentation

## 2024-04-19 DIAGNOSIS — Z170 Estrogen receptor positive status [ER+]: Secondary | ICD-10-CM | POA: Diagnosis not present

## 2024-04-19 DIAGNOSIS — Z79890 Hormone replacement therapy (postmenopausal): Secondary | ICD-10-CM | POA: Diagnosis not present

## 2024-04-19 DIAGNOSIS — M349 Systemic sclerosis, unspecified: Secondary | ICD-10-CM | POA: Insufficient documentation

## 2024-04-19 DIAGNOSIS — Z923 Personal history of irradiation: Secondary | ICD-10-CM | POA: Diagnosis not present

## 2024-04-19 DIAGNOSIS — Z79899 Other long term (current) drug therapy: Secondary | ICD-10-CM | POA: Insufficient documentation

## 2024-04-19 DIAGNOSIS — D649 Anemia, unspecified: Secondary | ICD-10-CM | POA: Insufficient documentation

## 2024-04-19 DIAGNOSIS — D631 Anemia in chronic kidney disease: Secondary | ICD-10-CM | POA: Diagnosis not present

## 2024-04-19 LAB — CBC WITH DIFFERENTIAL (CANCER CENTER ONLY)
Abs Immature Granulocytes: 0.02 K/uL (ref 0.00–0.07)
Basophils Absolute: 0.1 K/uL (ref 0.0–0.1)
Basophils Relative: 1 %
Eosinophils Absolute: 0.1 K/uL (ref 0.0–0.5)
Eosinophils Relative: 2 %
HCT: 30.7 % — ABNORMAL LOW (ref 36.0–46.0)
Hemoglobin: 9.8 g/dL — ABNORMAL LOW (ref 12.0–15.0)
Immature Granulocytes: 0 %
Lymphocytes Relative: 8 %
Lymphs Abs: 0.6 K/uL — ABNORMAL LOW (ref 0.7–4.0)
MCH: 30.1 pg (ref 26.0–34.0)
MCHC: 31.9 g/dL (ref 30.0–36.0)
MCV: 94.2 fL (ref 80.0–100.0)
Monocytes Absolute: 0.5 K/uL (ref 0.1–1.0)
Monocytes Relative: 7 %
Neutro Abs: 5.6 K/uL (ref 1.7–7.7)
Neutrophils Relative %: 82 %
Platelet Count: 274 K/uL (ref 150–400)
RBC: 3.26 MIL/uL — ABNORMAL LOW (ref 3.87–5.11)
RDW: 14.9 % (ref 11.5–15.5)
WBC Count: 6.8 K/uL (ref 4.0–10.5)
nRBC: 0 % (ref 0.0–0.2)

## 2024-04-19 NOTE — Patient Instructions (Signed)

## 2024-05-10 ENCOUNTER — Inpatient Hospital Stay

## 2024-05-10 ENCOUNTER — Inpatient Hospital Stay: Admitting: Hematology & Oncology

## 2024-05-10 ENCOUNTER — Encounter: Payer: Self-pay | Admitting: Hematology & Oncology

## 2024-05-10 VITALS — BP 112/57 | HR 84 | Temp 97.7°F | Resp 18 | Ht 64.0 in | Wt 126.8 lb

## 2024-05-10 VITALS — BP 126/59 | HR 84 | Temp 98.0°F | Resp 19

## 2024-05-10 DIAGNOSIS — M3500 Sicca syndrome, unspecified: Secondary | ICD-10-CM

## 2024-05-10 DIAGNOSIS — Z170 Estrogen receptor positive status [ER+]: Secondary | ICD-10-CM

## 2024-05-10 DIAGNOSIS — C50412 Malignant neoplasm of upper-outer quadrant of left female breast: Secondary | ICD-10-CM

## 2024-05-10 DIAGNOSIS — D509 Iron deficiency anemia, unspecified: Secondary | ICD-10-CM

## 2024-05-10 DIAGNOSIS — M349 Systemic sclerosis, unspecified: Secondary | ICD-10-CM

## 2024-05-10 DIAGNOSIS — D631 Anemia in chronic kidney disease: Secondary | ICD-10-CM | POA: Diagnosis not present

## 2024-05-10 DIAGNOSIS — N1831 Chronic kidney disease, stage 3a: Secondary | ICD-10-CM | POA: Diagnosis not present

## 2024-05-10 DIAGNOSIS — M3481 Systemic sclerosis with lung involvement: Secondary | ICD-10-CM | POA: Diagnosis not present

## 2024-05-10 DIAGNOSIS — R790 Abnormal level of blood mineral: Secondary | ICD-10-CM

## 2024-05-10 DIAGNOSIS — C50912 Malignant neoplasm of unspecified site of left female breast: Secondary | ICD-10-CM | POA: Diagnosis not present

## 2024-05-10 DIAGNOSIS — Z1732 Human epidermal growth factor receptor 2 negative status: Secondary | ICD-10-CM | POA: Diagnosis not present

## 2024-05-10 LAB — CBC WITH DIFFERENTIAL (CANCER CENTER ONLY)
Abs Immature Granulocytes: 0.03 K/uL (ref 0.00–0.07)
Basophils Absolute: 0 K/uL (ref 0.0–0.1)
Basophils Relative: 1 %
Eosinophils Absolute: 0.2 K/uL (ref 0.0–0.5)
Eosinophils Relative: 4 %
HCT: 28.4 % — ABNORMAL LOW (ref 36.0–46.0)
Hemoglobin: 8.9 g/dL — ABNORMAL LOW (ref 12.0–15.0)
Immature Granulocytes: 1 %
Lymphocytes Relative: 11 %
Lymphs Abs: 0.6 K/uL — ABNORMAL LOW (ref 0.7–4.0)
MCH: 29.3 pg (ref 26.0–34.0)
MCHC: 31.3 g/dL (ref 30.0–36.0)
MCV: 93.4 fL (ref 80.0–100.0)
Monocytes Absolute: 0.5 K/uL (ref 0.1–1.0)
Monocytes Relative: 8 %
Neutro Abs: 4.4 K/uL (ref 1.7–7.7)
Neutrophils Relative %: 75 %
Platelet Count: 233 K/uL (ref 150–400)
RBC: 3.04 MIL/uL — ABNORMAL LOW (ref 3.87–5.11)
RDW: 14.5 % (ref 11.5–15.5)
WBC Count: 5.8 K/uL (ref 4.0–10.5)
nRBC: 0 % (ref 0.0–0.2)

## 2024-05-10 LAB — CMP (CANCER CENTER ONLY)
ALT: 11 U/L (ref 0–44)
AST: 23 U/L (ref 15–41)
Albumin: 4.1 g/dL (ref 3.5–5.0)
Alkaline Phosphatase: 59 U/L (ref 38–126)
Anion gap: 12 (ref 5–15)
BUN: 9 mg/dL (ref 8–23)
CO2: 21 mmol/L — ABNORMAL LOW (ref 22–32)
Calcium: 9.1 mg/dL (ref 8.9–10.3)
Chloride: 104 mmol/L (ref 98–111)
Creatinine: 1.1 mg/dL — ABNORMAL HIGH (ref 0.44–1.00)
GFR, Estimated: 52 mL/min — ABNORMAL LOW (ref >60)
Glucose, Bld: 106 mg/dL — ABNORMAL HIGH (ref 70–99)
Potassium: 3.5 mmol/L (ref 3.5–5.1)
Sodium: 137 mmol/L (ref 135–145)
Total Bilirubin: 0.3 mg/dL (ref 0.0–1.2)
Total Protein: 6.5 g/dL (ref 6.5–8.1)

## 2024-05-10 LAB — FERRITIN: Ferritin: 69 ng/mL (ref 11–307)

## 2024-05-10 LAB — MAGNESIUM: Magnesium: 1.5 mg/dL — ABNORMAL LOW (ref 1.7–2.4)

## 2024-05-10 LAB — C-REACTIVE PROTEIN: CRP: <0.5 mg/dL (ref <1.0)

## 2024-05-10 LAB — IRON AND IRON BINDING CAPACITY (CC-WL,HP ONLY)
Iron: 91 ug/dL (ref 28–170)
Saturation Ratios: 30 % (ref 10.4–31.8)
TIBC: 301 ug/dL (ref 250–450)
UIBC: 210 ug/dL

## 2024-05-10 LAB — LACTATE DEHYDROGENASE: LDH: 223 U/L — ABNORMAL HIGH (ref 98–192)

## 2024-05-10 LAB — CK: Total CK: 62 U/L (ref 38–234)

## 2024-05-10 LAB — RETICULOCYTES
Immature Retic Fract: 10.1 % (ref 2.3–15.9)
RBC.: 3.04 MIL/uL — ABNORMAL LOW (ref 3.87–5.11)
Retic Count, Absolute: 47.7 K/uL (ref 19.0–186.0)
Retic Ct Pct: 1.6 % (ref 0.4–3.1)

## 2024-05-10 MED ORDER — ACETAMINOPHEN 325 MG PO TABS: 650.0000 mg | ORAL_TABLET | Freq: Once | ORAL | Status: DC

## 2024-05-10 MED ORDER — IMMUNE GLOBULIN (HUMAN) 20 GM/200ML IV SOLN
25.0000 g | Freq: Once | INTRAVENOUS | Status: AC
  Administered 2024-05-10: 25 g via INTRAVENOUS
  Filled 2024-05-10: qty 50

## 2024-05-10 MED ORDER — DEXTROSE 5 % IV SOLN: INTRAVENOUS | Status: DC

## 2024-05-10 MED ORDER — DIPHENHYDRAMINE HCL 25 MG PO CAPS: 25.0000 mg | ORAL_CAPSULE | Freq: Once | ORAL | Status: DC

## 2024-05-10 NOTE — Patient Instructions (Signed)
 Immune Globulin  Injection What is this medication? IMMUNE GLOBULIN  (im MUNE GLOB yoo lin) treats many immune system conditions. It works by Designer, multimedia extra antibodies. Antibodies are proteins made by the immune system that help protect the body. This medicine may be used for other purposes; ask your health care provider or pharmacist if you have questions. COMMON BRAND NAME(S): ASCENIV, Baygam, BIVIGAM, Carimune, Carimune NF, cutaquig, Cuvitru, Flebogamma, Flebogamma DIF, GamaSTAN, GamaSTAN S/D, Gamimune N, Gammagard, Gammagard S/D, Gammaked, Gammaplex, Gammar-P IV, Gamunex, Gamunex-C, Hizentra, Iveegam, Iveegam EN, Octagam, Panglobulin, Panglobulin NF, panzyga, Polygam S/D, Privigen , Sandoglobulin, Venoglobulin-S, Vigam, Vivaglobulin, Xembify What should I tell my care team before I take this medication? They need to know if you have any of these conditions: Blood clotting disorder Condition where you have excess fluid in your body, such as heart failure or edema Dehydration Diabetes Have had blood clots Heart disease Immune system conditions Kidney disease Low levels of IgA Recent or upcoming vaccine An unusual or allergic reaction to immune globulin , other medications, foods, dyes, or preservatives Pregnant or trying to get pregnant Breastfeeding How should I use this medication? This medication is infused into a vein or under the skin. It may also be injected into a muscle. It is usually given by your care team in a hospital or clinic setting. It may also be given at home. If you get this medication at home, you will be taught how to prepare and give it. Take it as directed on the prescription label. Keep taking it unless your care team tells you to stop. It is important that you put your used needles and syringes in a special sharps container. Do not put them in a trash can. If you do not have a sharps container, call your pharmacist or care team to get one. Talk to your care team  about the use of this medication in children. While it may be given to children for selected conditions, precautions do apply. Overdosage: If you think you have taken too much of this medicine contact a poison control center or emergency room at once. NOTE: This medicine is only for you. Do not share this medicine with others. What if I miss a dose? If you get this medication at the hospital or clinic: It is important not to miss your dose. Call your care team if you are unable to keep an appointment. If you give yourself this medication at home: If you miss a dose, take it as soon as you can. Then continue your normal schedule. If it is almost time for your next dose, take only that dose. Do not take double or extra doses. Call your care team with questions. What may interact with this medication? Live virus vaccines This list may not describe all possible interactions. Give your health care provider a list of all the medicines, herbs, non-prescription drugs, or dietary supplements you use. Also tell them if you smoke, drink alcohol , or use illegal drugs. Some items may interact with your medicine. What should I watch for while using this medication? Your condition will be monitored carefully while you are receiving this medication. Tell your care team if your symptoms do not start to get better or if they get worse. You may need blood work done while you are taking this medication. This medication increases the risk of blood clots. People with heart, blood vessel, or blood clotting conditions are more likely to develop a blood clot. Other risk factors include advanced age, estrogen use, tobacco  use, lack of movement, and being overweight. This medication can decrease the response to a vaccine. If you need to get vaccinated, tell your care team if you have received this medication within the last year. Extra booster doses may be needed. Talk to your care team to see if a different vaccination schedule  is needed. This medication is made from donated human blood. There is a small risk it may contain bacteria or viruses, such as hepatitis or HIV. All products are processed to kill most bacteria and viruses. Talk to your care team if you have questions about the risk of infection. If you have diabetes, talk to your care team about which device you should use to check your blood sugar. This medication may cause some devices to report falsely high blood sugar levels. This may cause you to react by not treating a low blood sugar level or by giving an insulin  dose that was not needed. This can cause severe low blood sugar levels. What side effects may I notice from receiving this medication? Side effects that you should report to your care team as soon as possible: Allergic reactions--skin rash, itching, hives, swelling of the face, lips, tongue, or throat Blood clot--pain, swelling, or warmth in the leg, shortness of breath, chest pain Fever, neck pain or stiffness, sensitivity to light, headache, nausea, vomiting, confusion, which may be signs of meningitis Hemolytic anemia--unusual weakness or fatigue, dizziness, headache, trouble breathing, dark urine, yellowing skin or eyes Kidney injury--decrease in the amount of urine, swelling of the ankles, hands, or feet Low sodium level--muscle weakness, fatigue, dizziness, headache, confusion Shortness of breath or trouble breathing, cough, unusual weakness or fatigue, blue skin or lips Side effects that usually do not require medical attention (report these to your care team if they continue or are bothersome): Chills Diarrhea Fever Headache Nausea This list may not describe all possible side effects. Call your doctor for medical advice about side effects. You may report side effects to FDA at 1-800-FDA-1088. Where should I keep my medication? Keep out of the reach of children and pets. You will be instructed on how to store this medication. Get rid of  any unused medication after the expiration date. To get rid of medications that are no longer needed or have expired: Take the medication to a medication take-back program. Check with your pharmacy or law enforcement to find a location. If you cannot return the medication, ask your pharmacist or care team how to get rid of this medication safely. NOTE: This sheet is a summary. It may not cover all possible information. If you have questions about this medicine, talk to your doctor, pharmacist, or health care provider.  2025 Elsevier/Gold Standard (2023-09-15 00:00:00)

## 2024-05-10 NOTE — Progress Notes (Unsigned)
 Was asked to get ABN on patient on 02/25/24. Followed up with [name] who stated that ABN was not needed on this patient.   Per Jennifer's email: [name] has reviewed this and there was no need for an ABN to begin with.  There is a process designated for patients needing an ABN and it is initiated by the Authorization team.  As a part of that process, if an ABN needs to be signed, the campus Admin leader and Registration team would be notified in advance.  No need to worry about being dropped in your lap as a surprise.

## 2024-05-10 NOTE — Progress Notes (Unsigned)
 Hematology and Oncology Follow Up Visit  [name] [id number] [birth year] 73 y.o. 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  So far, everything's been doing pretty well for her.  She and her husband just got back from [place].  They had a wonderful time over in [place].  They went with a group.  She has her appointment up at [facility] next week.  Apparently, that her rheumatologist up there is a bit worried about the tumor marker.  Her CA 27.29 has been chronically elevated.  When we last checked it it was 62.  I will go ahead and do a CT scan.  So far, we have not found any evidence of metastatic disease.  She has had MRIs of the breast.  Of the last were done back in May and everything looked fine on those MRIs.  She is somewhat anemic.  She apparently has not responded well to the Aranesp .  I suppose this is not all that surprising.  With her, we may have to use Procrit /Retacrit and use a short acting agent to try to get her hemoglobin up.  For right now, she really is not bothered by the anemia.  As such, I would probably just watch for right now.  She has had no bleeding.  Has been no change in bowel or bladder habits.  She has had no problems swallowing.  The IVIG really has helped her symptoms with respect to her scleroderma.  Overall, I would have to say that her performance status is probably ECOG 1.     Medications:  Current Outpatient Medications:    acetaminophen  (TYLENOL ) 650 MG CR tablet, Take 650 mg by mouth daily as needed for pain., Disp: , Rfl:    benzonatate  (TESSALON ) 100 MG capsule, Take 1  capsule (100 mg total) by mouth 3 (three) times daily as needed for cough., Disp: 30 capsule, Rfl: 3   diphenhydrAMINE  (BENADRYL ) 25 mg capsule, Take 25 mg by mouth every 6 (six) hours as needed for allergies. Allergies and before IVIG treatment., Disp: , Rfl:    diphenoxylate -atropine  (LOMOTIL ) 2.5-0.025 MG tablet, Take 1 tablet by mouth 3 (three) times daily as needed for diarrhea or loose stools., Disp: 60 tablet, Rfl: 1   ELDERBERRY PO, Take 1 oz by mouth 4 (four) times daily as needed. Liquid, Disp: , Rfl:    furosemide  (LASIX ) 20 MG tablet, TAKE 1 TABLET(20 MG) BY MOUTH DAILY, Disp: 90 tablet, Rfl: 0   loperamide (IMODIUM A-D) 2 MG tablet, Take 2 mg by mouth 4 (four) times daily as needed for diarrhea or loose stools., Disp: , Rfl:    losartan  (COZAAR ) 25 MG tablet, Take 25 mg by mouth in the morning and at bedtime., Disp: , Rfl:    Multiple Vitamin (MULTIVITAMIN) capsule, Take 1 capsule by mouth daily., Disp: , Rfl:    mycophenolate (CELLCEPT) 500 MG tablet, Take 1,500 mg by mouth 2 (two) times daily., Disp: , Rfl:    sodium chloride  (OCEAN) 0.65 % SOLN nasal spray, Place 1 spray into both nostrils as needed for congestion., Disp: , Rfl:    SYNTHROID  100 MCG tablet, Take 1 tablet (  100 mcg total) by mouth daily before breakfast., Disp: 90 tablet, Rfl: 3   Vonoprazan Fumarate  (VOQUEZNA ) 10 MG TABS, Take 1 tablet by mouth daily., Disp: 30 tablet, Rfl: 5   mupirocin ointment (BACTROBAN) 2 %, Apply within both nasal passages twice daily for twice daily. (Patient not taking: Reported on 05/10/2024), Disp: , Rfl:   Allergies:  Allergies  Allergen Reactions   Betadine [Povidone Iodine] Rash   Epinephrine Other (See Comments)    Severe headaches    Iodinated Contrast Media Other (See Comments) and Rash    per pt: recently had CT 08/ 2018 even through given pre-medication, IVP still caused rash  per pt recently had CT 08/ 2018 even through given pre-medication, IVP still caused rash    Iodine Rash and Other (See Comments)   Oxycodone  Anxiety   Prednisone  Anaphylaxis    Per Nephrology pt should avoid this medication because she has Scleroderma and it will interfere with her Kidneys,    Gabapentin Other (See Comments)    Dizziness   Lidocaine  Rash   Nickel Rash and Other (See Comments)    Rash and blisters   Other Other (See Comments) and Rash    Hospital gown  Product containing penicillin (product)  Substance with sulfonamide structure and antibacterial mechanism of action (substance)   Penicillins Rash and Other (See Comments)    Rash and blisters Has patient had a PCN reaction causing immediate rash, facial/tongue/throat swelling, SOB or lightheadedness with hypotension: Yes Has patient had a PCN reaction causing severe rash involving mucus membranes or skin necrosis: No Has patient had a PCN reaction that required hospitalization: No Has patient had a PCN reaction occurring within the last 10 years: No If all of the above answers are NO, then may proceed with Cephalosporin use.    Sulfa Antibiotics Rash    Past Medical History, Surgical history, Social history, and Family History were reviewed and updated.  Review of Systems: Review of Systems  Constitutional:  Positive for fatigue.  HENT:  Negative.    Eyes: Negative.   Respiratory: Negative.    Cardiovascular: Negative.   Gastrointestinal: Negative.   Endocrine: Negative.   Genitourinary: Negative.    Musculoskeletal:  Positive for arthralgias and myalgias.  Skin:  Positive for rash.  Neurological: Negative.   Hematological: Negative.   Psychiatric/Behavioral: Negative.      Physical Exam: Temperature is 97.7.  Pulse 84.  Blood pressure 112/57.  Weight is 126 pounds.     Wt Readings from Last 3 Encounters:  05/10/24 126 lb 12.8 oz (57.5 kg)  03/10/24 133 lb 6 oz (60.5 kg)  03/08/24 133 lb 9.6 oz (60.6 kg)    Physical Exam Vitals reviewed.  Constitutional:      Comments: Her breast  exam shows right breast with no masses, edema or erythema.  There is no right axillary adenopathy.  Left breast shows the lumpectomy scar at about the 2 o'clock position.  There is little firmness at the lumpectomy site.  No distinct masses noted.  There is no left axillary adenopathy.  There is no left nipple discharge.  HENT:     Head: Normocephalic and atraumatic.  Eyes:     Pupils: Pupils are equal, round, and reactive to light.  Cardiovascular:     Rate and Rhythm: Normal rate and regular rhythm.     Heart sounds: Normal heart sounds.  Pulmonary:     Effort: Pulmonary effort is normal.     Breath sounds: Normal breath  sounds.  Abdominal:     General: Bowel sounds are normal.     Palpations: Abdomen is soft.  Musculoskeletal:        General: No tenderness or deformity. Normal range of motion.     Cervical back: Normal range of motion.  Lymphadenopathy:     Cervical: No cervical adenopathy.  Skin:    General: Skin is warm and dry.     Findings: No erythema or rash.     Comments: Skin exam shows some changes of the scleroderma.  This is mostly on the face, around the lip area.  Her arms do show a little bit of skin thickening.  Neurological:     Mental Status: She is alert and oriented to person, place, and time.  Psychiatric:        Behavior: Behavior normal.        Thought Content: Thought content normal.        Judgment: Judgment normal.      Lab Results  Component Value Date   WBC 5.8 05/10/2024   HGB 8.9 (L) 05/10/2024   HCT 28.4 (L) 05/10/2024   MCV 93.4 05/10/2024   PLT 233 05/10/2024     Chemistry      Component Value Date/Time   NA 136 03/19/2024 1111   NA 144 01/27/2020 1148   NA 141 06/09/2017 1117   K 3.5 03/19/2024 1111   K 3.9 06/09/2017 1117   CL 104 03/19/2024 1111   CL 109 (H) 12/14/2012 1253   CO2 21 (L) 03/19/2024 1111   CO2 23 06/09/2017 1117   BUN 11 03/19/2024 1111   BUN 11 01/27/2020 1148   BUN 11.5 06/09/2017 1117   CREATININE 1.04 (H)  03/19/2024 1111   CREATININE 1.2 (H) 06/09/2017 1117      Component Value Date/Time   CALCIUM  9.1 03/19/2024 1111   CALCIUM  8.9 06/09/2017 1117   ALKPHOS 59 03/19/2024 1111   ALKPHOS 40 06/09/2017 1117   AST 24 03/19/2024 1111   AST 35 (H) 06/09/2017 1117   ALT 11 03/19/2024 1111   ALT 19 06/09/2017 1117   BILITOT 0.3 03/19/2024 1111   BILITOT 0.37 06/09/2017 1117      Impression and Plan: [name] is a very charming 73 year old white female.  She has had a history of early stage breast cancer.   Her main problem is scleroderma.  We are giving her IVIG for this.  I think that her rheumatologist wants to have her get IVIG every 3 months now.  I certainly think this is very reasonable.  She has tolerated the IVIG quite nicely.  Again, it is helped her side effects.  We will set her up with a CT scan of the chest/abdomen/pelvis.  This will be done without contrast.  We will try to get this set up for this week.  Again, she is not bothered by the anemia.  Will have to watch this closely.  I think what we will have to do is probably get her back in a month or so.  I would probably would like to try to get her back before the Thanksgiving holiday.  I want to make sure that we work on her blood if we needed to so that she will not have any problems with fatigue.  Hopefully, her appointment up at [facility] will go well.      Maude JONELLE Crease, MD 10/27/20259:39 AM

## 2024-05-10 NOTE — Patient Instructions (Signed)

## 2024-05-11 ENCOUNTER — Inpatient Hospital Stay

## 2024-05-11 ENCOUNTER — Encounter: Payer: Self-pay | Admitting: Hematology & Oncology

## 2024-05-11 VITALS — BP 129/54 | HR 82 | Temp 97.7°F

## 2024-05-11 DIAGNOSIS — D509 Iron deficiency anemia, unspecified: Secondary | ICD-10-CM

## 2024-05-11 DIAGNOSIS — M3481 Systemic sclerosis with lung involvement: Secondary | ICD-10-CM | POA: Diagnosis not present

## 2024-05-11 DIAGNOSIS — C50912 Malignant neoplasm of unspecified site of left female breast: Secondary | ICD-10-CM | POA: Diagnosis not present

## 2024-05-11 DIAGNOSIS — Z170 Estrogen receptor positive status [ER+]: Secondary | ICD-10-CM | POA: Diagnosis not present

## 2024-05-11 DIAGNOSIS — Z1732 Human epidermal growth factor receptor 2 negative status: Secondary | ICD-10-CM | POA: Diagnosis not present

## 2024-05-11 DIAGNOSIS — M3500 Sicca syndrome, unspecified: Secondary | ICD-10-CM

## 2024-05-11 DIAGNOSIS — N1831 Chronic kidney disease, stage 3a: Secondary | ICD-10-CM | POA: Diagnosis not present

## 2024-05-11 DIAGNOSIS — D631 Anemia in chronic kidney disease: Secondary | ICD-10-CM | POA: Diagnosis not present

## 2024-05-11 LAB — CANCER ANTIGEN 27.29: CA 27.29: 62.2 U/mL — ABNORMAL HIGH (ref 0.0–38.6)

## 2024-05-11 LAB — IGG, IGA, IGM
IgA: 105 mg/dL (ref 64–422)
IgG (Immunoglobin G), Serum: 808 mg/dL (ref 586–1602)
IgM (Immunoglobulin M), Srm: 49 mg/dL (ref 26–217)

## 2024-05-11 LAB — KAPPA/LAMBDA LIGHT CHAINS
Kappa free light chain: 15.2 mg/L (ref 3.3–19.4)
Kappa, lambda light chain ratio: 1.17 (ref 0.26–1.65)
Lambda free light chains: 13 mg/L (ref 5.7–26.3)

## 2024-05-11 MED ORDER — IMMUNE GLOBULIN (HUMAN) 20 GM/200ML IV SOLN
25.0000 g | Freq: Once | INTRAVENOUS | Status: AC
  Administered 2024-05-11: 25 g via INTRAVENOUS
  Filled 2024-05-11: qty 200

## 2024-05-11 MED ORDER — DIPHENHYDRAMINE HCL 25 MG PO CAPS: 25.0000 mg | ORAL_CAPSULE | Freq: Once | ORAL | Status: DC

## 2024-05-11 MED ORDER — ACETAMINOPHEN 325 MG PO TABS: 650.0000 mg | ORAL_TABLET | Freq: Once | ORAL | Status: DC

## 2024-05-11 MED ORDER — DEXTROSE 5 % IV SOLN: INTRAVENOUS | Status: DC

## 2024-05-11 NOTE — Patient Instructions (Signed)
 Immune Globulin  Injection What is this medication? IMMUNE GLOBULIN  (im MUNE GLOB yoo lin) treats many immune system conditions. It works by Designer, multimedia extra antibodies. Antibodies are proteins made by the immune system that help protect the body. This medicine may be used for other purposes; ask your health care provider or pharmacist if you have questions. COMMON BRAND NAME(S): ASCENIV, Baygam, BIVIGAM, Carimune, Carimune NF, cutaquig, Cuvitru, Flebogamma, Flebogamma DIF, GamaSTAN, GamaSTAN S/D, Gamimune N, Gammagard, Gammagard S/D, Gammaked, Gammaplex, Gammar-P IV, Gamunex, Gamunex-C, Hizentra, Iveegam, Iveegam EN, Octagam, Panglobulin, Panglobulin NF, panzyga, Polygam S/D, Privigen , Sandoglobulin, Venoglobulin-S, Vigam, Vivaglobulin, Xembify What should I tell my care team before I take this medication? They need to know if you have any of these conditions: Blood clotting disorder Condition where you have excess fluid in your body, such as heart failure or edema Dehydration Diabetes Have had blood clots Heart disease Immune system conditions Kidney disease Low levels of IgA Recent or upcoming vaccine An unusual or allergic reaction to immune globulin , other medications, foods, dyes, or preservatives Pregnant or trying to get pregnant Breastfeeding How should I use this medication? This medication is infused into a vein or under the skin. It may also be injected into a muscle. It is usually given by your care team in a hospital or clinic setting. It may also be given at home. If you get this medication at home, you will be taught how to prepare and give it. Take it as directed on the prescription label. Keep taking it unless your care team tells you to stop. It is important that you put your used needles and syringes in a special sharps container. Do not put them in a trash can. If you do not have a sharps container, call your pharmacist or care team to get one. Talk to your care team  about the use of this medication in children. While it may be given to children for selected conditions, precautions do apply. Overdosage: If you think you have taken too much of this medicine contact a poison control center or emergency room at once. NOTE: This medicine is only for you. Do not share this medicine with others. What if I miss a dose? If you get this medication at the hospital or clinic: It is important not to miss your dose. Call your care team if you are unable to keep an appointment. If you give yourself this medication at home: If you miss a dose, take it as soon as you can. Then continue your normal schedule. If it is almost time for your next dose, take only that dose. Do not take double or extra doses. Call your care team with questions. What may interact with this medication? Live virus vaccines This list may not describe all possible interactions. Give your health care provider a list of all the medicines, herbs, non-prescription drugs, or dietary supplements you use. Also tell them if you smoke, drink alcohol , or use illegal drugs. Some items may interact with your medicine. What should I watch for while using this medication? Your condition will be monitored carefully while you are receiving this medication. Tell your care team if your symptoms do not start to get better or if they get worse. You may need blood work done while you are taking this medication. This medication increases the risk of blood clots. People with heart, blood vessel, or blood clotting conditions are more likely to develop a blood clot. Other risk factors include advanced age, estrogen use, tobacco  use, lack of movement, and being overweight. This medication can decrease the response to a vaccine. If you need to get vaccinated, tell your care team if you have received this medication within the last year. Extra booster doses may be needed. Talk to your care team to see if a different vaccination schedule  is needed. This medication is made from donated human blood. There is a small risk it may contain bacteria or viruses, such as hepatitis or HIV. All products are processed to kill most bacteria and viruses. Talk to your care team if you have questions about the risk of infection. If you have diabetes, talk to your care team about which device you should use to check your blood sugar. This medication may cause some devices to report falsely high blood sugar levels. This may cause you to react by not treating a low blood sugar level or by giving an insulin  dose that was not needed. This can cause severe low blood sugar levels. What side effects may I notice from receiving this medication? Side effects that you should report to your care team as soon as possible: Allergic reactions--skin rash, itching, hives, swelling of the face, lips, tongue, or throat Blood clot--pain, swelling, or warmth in the leg, shortness of breath, chest pain Fever, neck pain or stiffness, sensitivity to light, headache, nausea, vomiting, confusion, which may be signs of meningitis Hemolytic anemia--unusual weakness or fatigue, dizziness, headache, trouble breathing, dark urine, yellowing skin or eyes Kidney injury--decrease in the amount of urine, swelling of the ankles, hands, or feet Low sodium level--muscle weakness, fatigue, dizziness, headache, confusion Shortness of breath or trouble breathing, cough, unusual weakness or fatigue, blue skin or lips Side effects that usually do not require medical attention (report these to your care team if they continue or are bothersome): Chills Diarrhea Fever Headache Nausea This list may not describe all possible side effects. Call your doctor for medical advice about side effects. You may report side effects to FDA at 1-800-FDA-1088. Where should I keep my medication? Keep out of the reach of children and pets. You will be instructed on how to store this medication. Get rid of  any unused medication after the expiration date. To get rid of medications that are no longer needed or have expired: Take the medication to a medication take-back program. Check with your pharmacy or law enforcement to find a location. If you cannot return the medication, ask your pharmacist or care team how to get rid of this medication safely. NOTE: This sheet is a summary. It may not cover all possible information. If you have questions about this medicine, talk to your doctor, pharmacist, or health care provider.  2025 Elsevier/Gold Standard (2023-09-15 00:00:00)

## 2024-05-12 ENCOUNTER — Inpatient Hospital Stay

## 2024-05-12 ENCOUNTER — Ambulatory Visit (HOSPITAL_BASED_OUTPATIENT_CLINIC_OR_DEPARTMENT_OTHER)
Admission: RE | Admit: 2024-05-12 | Discharge: 2024-05-12 | Disposition: A | Discharge status: Home / Self Care | Source: Ambulatory Visit | Attending: Hematology & Oncology | Admitting: Hematology & Oncology

## 2024-05-12 VITALS — BP 111/61 | HR 82 | Temp 98.0°F | Resp 18

## 2024-05-12 DIAGNOSIS — D509 Iron deficiency anemia, unspecified: Secondary | ICD-10-CM

## 2024-05-12 DIAGNOSIS — Z170 Estrogen receptor positive status [ER+]: Secondary | ICD-10-CM | POA: Insufficient documentation

## 2024-05-12 DIAGNOSIS — J849 Interstitial pulmonary disease, unspecified: Secondary | ICD-10-CM | POA: Diagnosis not present

## 2024-05-12 DIAGNOSIS — D631 Anemia in chronic kidney disease: Secondary | ICD-10-CM | POA: Diagnosis not present

## 2024-05-12 DIAGNOSIS — Z1732 Human epidermal growth factor receptor 2 negative status: Secondary | ICD-10-CM | POA: Diagnosis not present

## 2024-05-12 DIAGNOSIS — C50912 Malignant neoplasm of unspecified site of left female breast: Secondary | ICD-10-CM | POA: Diagnosis not present

## 2024-05-12 DIAGNOSIS — R918 Other nonspecific abnormal finding of lung field: Secondary | ICD-10-CM | POA: Diagnosis not present

## 2024-05-12 DIAGNOSIS — N1831 Chronic kidney disease, stage 3a: Secondary | ICD-10-CM | POA: Diagnosis not present

## 2024-05-12 DIAGNOSIS — Z853 Personal history of malignant neoplasm of breast: Secondary | ICD-10-CM | POA: Diagnosis not present

## 2024-05-12 DIAGNOSIS — M3500 Sicca syndrome, unspecified: Secondary | ICD-10-CM

## 2024-05-12 DIAGNOSIS — M3481 Systemic sclerosis with lung involvement: Secondary | ICD-10-CM | POA: Diagnosis not present

## 2024-05-12 DIAGNOSIS — C50412 Malignant neoplasm of upper-outer quadrant of left female breast: Secondary | ICD-10-CM | POA: Insufficient documentation

## 2024-05-12 DIAGNOSIS — I3139 Other pericardial effusion (noninflammatory): Secondary | ICD-10-CM | POA: Diagnosis not present

## 2024-05-12 LAB — ALDOLASE: Aldolase: 3.5 U/L (ref 3.3–10.3)

## 2024-05-12 MED ORDER — IMMUNE GLOBULIN (HUMAN) 20 GM/200ML IV SOLN
25.0000 g | Freq: Once | INTRAVENOUS | Status: AC
  Administered 2024-05-12: 25 g via INTRAVENOUS
  Filled 2024-05-12: qty 200

## 2024-05-12 MED ORDER — DIPHENHYDRAMINE HCL 25 MG PO CAPS: 25.0000 mg | ORAL_CAPSULE | Freq: Once | ORAL | Status: DC

## 2024-05-12 MED ORDER — ACETAMINOPHEN 325 MG PO TABS: 650.0000 mg | ORAL_TABLET | Freq: Once | ORAL | Status: DC

## 2024-05-12 MED ORDER — DEXTROSE 5 % IV SOLN: INTRAVENOUS | Status: DC

## 2024-05-12 NOTE — Patient Instructions (Signed)
 Immune Globulin  Injection What is this medication? IMMUNE GLOBULIN  (im MUNE GLOB yoo lin) treats many immune system conditions. It works by Designer, multimedia extra antibodies. Antibodies are proteins made by the immune system that help protect the body. This medicine may be used for other purposes; ask your health care provider or pharmacist if you have questions. COMMON BRAND NAME(S): ASCENIV, Baygam, BIVIGAM, Carimune, Carimune NF, cutaquig, Cuvitru, Flebogamma, Flebogamma DIF, GamaSTAN, GamaSTAN S/D, Gamimune N, Gammagard, Gammagard S/D, Gammaked, Gammaplex, Gammar-P IV, Gamunex, Gamunex-C, Hizentra, Iveegam, Iveegam EN, Octagam, Panglobulin, Panglobulin NF, panzyga, Polygam S/D, Privigen , Sandoglobulin, Venoglobulin-S, Vigam, Vivaglobulin, Xembify What should I tell my care team before I take this medication? They need to know if you have any of these conditions: Blood clotting disorder Condition where you have excess fluid in your body, such as heart failure or edema Dehydration Diabetes Have had blood clots Heart disease Immune system conditions Kidney disease Low levels of IgA Recent or upcoming vaccine An unusual or allergic reaction to immune globulin , other medications, foods, dyes, or preservatives Pregnant or trying to get pregnant Breastfeeding How should I use this medication? This medication is infused into a vein or under the skin. It may also be injected into a muscle. It is usually given by your care team in a hospital or clinic setting. It may also be given at home. If you get this medication at home, you will be taught how to prepare and give it. Take it as directed on the prescription label. Keep taking it unless your care team tells you to stop. It is important that you put your used needles and syringes in a special sharps container. Do not put them in a trash can. If you do not have a sharps container, call your pharmacist or care team to get one. Talk to your care team  about the use of this medication in children. While it may be given to children for selected conditions, precautions do apply. Overdosage: If you think you have taken too much of this medicine contact a poison control center or emergency room at once. NOTE: This medicine is only for you. Do not share this medicine with others. What if I miss a dose? If you get this medication at the hospital or clinic: It is important not to miss your dose. Call your care team if you are unable to keep an appointment. If you give yourself this medication at home: If you miss a dose, take it as soon as you can. Then continue your normal schedule. If it is almost time for your next dose, take only that dose. Do not take double or extra doses. Call your care team with questions. What may interact with this medication? Live virus vaccines This list may not describe all possible interactions. Give your health care provider a list of all the medicines, herbs, non-prescription drugs, or dietary supplements you use. Also tell them if you smoke, drink alcohol , or use illegal drugs. Some items may interact with your medicine. What should I watch for while using this medication? Your condition will be monitored carefully while you are receiving this medication. Tell your care team if your symptoms do not start to get better or if they get worse. You may need blood work done while you are taking this medication. This medication increases the risk of blood clots. People with heart, blood vessel, or blood clotting conditions are more likely to develop a blood clot. Other risk factors include advanced age, estrogen use, tobacco  use, lack of movement, and being overweight. This medication can decrease the response to a vaccine. If you need to get vaccinated, tell your care team if you have received this medication within the last year. Extra booster doses may be needed. Talk to your care team to see if a different vaccination schedule  is needed. This medication is made from donated human blood. There is a small risk it may contain bacteria or viruses, such as hepatitis or HIV. All products are processed to kill most bacteria and viruses. Talk to your care team if you have questions about the risk of infection. If you have diabetes, talk to your care team about which device you should use to check your blood sugar. This medication may cause some devices to report falsely high blood sugar levels. This may cause you to react by not treating a low blood sugar level or by giving an insulin  dose that was not needed. This can cause severe low blood sugar levels. What side effects may I notice from receiving this medication? Side effects that you should report to your care team as soon as possible: Allergic reactions--skin rash, itching, hives, swelling of the face, lips, tongue, or throat Blood clot--pain, swelling, or warmth in the leg, shortness of breath, chest pain Fever, neck pain or stiffness, sensitivity to light, headache, nausea, vomiting, confusion, which may be signs of meningitis Hemolytic anemia--unusual weakness or fatigue, dizziness, headache, trouble breathing, dark urine, yellowing skin or eyes Kidney injury--decrease in the amount of urine, swelling of the ankles, hands, or feet Low sodium level--muscle weakness, fatigue, dizziness, headache, confusion Shortness of breath or trouble breathing, cough, unusual weakness or fatigue, blue skin or lips Side effects that usually do not require medical attention (report these to your care team if they continue or are bothersome): Chills Diarrhea Fever Headache Nausea This list may not describe all possible side effects. Call your doctor for medical advice about side effects. You may report side effects to FDA at 1-800-FDA-1088. Where should I keep my medication? Keep out of the reach of children and pets. You will be instructed on how to store this medication. Get rid of  any unused medication after the expiration date. To get rid of medications that are no longer needed or have expired: Take the medication to a medication take-back program. Check with your pharmacy or law enforcement to find a location. If you cannot return the medication, ask your pharmacist or care team how to get rid of this medication safely. NOTE: This sheet is a summary. It may not cover all possible information. If you have questions about this medicine, talk to your doctor, pharmacist, or health care provider.  2025 Elsevier/Gold Standard (2023-09-15 00:00:00)

## 2024-05-12 NOTE — Progress Notes (Signed)
 Patient does not want to stay for the 30 minute post IVIG observation. VSS. Patient discharged ambulatory without questions or concerns.

## 2024-05-13 ENCOUNTER — Inpatient Hospital Stay

## 2024-05-13 VITALS — BP 131/69 | HR 85 | Temp 98.1°F | Resp 18

## 2024-05-13 DIAGNOSIS — D509 Iron deficiency anemia, unspecified: Secondary | ICD-10-CM

## 2024-05-13 DIAGNOSIS — Z1732 Human epidermal growth factor receptor 2 negative status: Secondary | ICD-10-CM | POA: Diagnosis not present

## 2024-05-13 DIAGNOSIS — Z170 Estrogen receptor positive status [ER+]: Secondary | ICD-10-CM | POA: Diagnosis not present

## 2024-05-13 DIAGNOSIS — D631 Anemia in chronic kidney disease: Secondary | ICD-10-CM | POA: Diagnosis not present

## 2024-05-13 DIAGNOSIS — C50912 Malignant neoplasm of unspecified site of left female breast: Secondary | ICD-10-CM | POA: Diagnosis not present

## 2024-05-13 DIAGNOSIS — M3481 Systemic sclerosis with lung involvement: Secondary | ICD-10-CM | POA: Diagnosis not present

## 2024-05-13 DIAGNOSIS — N1831 Chronic kidney disease, stage 3a: Secondary | ICD-10-CM | POA: Diagnosis not present

## 2024-05-13 MED ORDER — DIPHENHYDRAMINE HCL 25 MG PO CAPS: 25.0000 mg | ORAL_CAPSULE | Freq: Once | ORAL | Status: DC

## 2024-05-13 MED ORDER — ACETAMINOPHEN 325 MG PO TABS: 650.0000 mg | ORAL_TABLET | Freq: Once | ORAL | Status: DC

## 2024-05-13 MED ORDER — IMMUNE GLOBULIN (HUMAN) 20 GM/200ML IV SOLN
25.0000 g | Freq: Once | INTRAVENOUS | Status: AC
  Administered 2024-05-13: 25 g via INTRAVENOUS
  Filled 2024-05-13: qty 200

## 2024-05-13 MED ORDER — DEXTROSE 5 % IV SOLN: INTRAVENOUS | Status: DC

## 2024-05-13 NOTE — Patient Instructions (Signed)
 Immune Globulin  Injection What is this medication? IMMUNE GLOBULIN  (im MUNE GLOB yoo lin) treats many immune system conditions. It works by Designer, multimedia extra antibodies. Antibodies are proteins made by the immune system that help protect the body. This medicine may be used for other purposes; ask your health care provider or pharmacist if you have questions. COMMON BRAND NAME(S): ASCENIV, Baygam, BIVIGAM, Carimune, Carimune NF, cutaquig, Cuvitru, Flebogamma, Flebogamma DIF, GamaSTAN, GamaSTAN S/D, Gamimune N, Gammagard, Gammagard S/D, Gammaked, Gammaplex, Gammar-P IV, Gamunex, Gamunex-C, Hizentra, Iveegam, Iveegam EN, Octagam, Panglobulin, Panglobulin NF, panzyga, Polygam S/D, Privigen , Sandoglobulin, Venoglobulin-S, Vigam, Vivaglobulin, Xembify What should I tell my care team before I take this medication? They need to know if you have any of these conditions: Blood clotting disorder Condition where you have excess fluid in your body, such as heart failure or edema Dehydration Diabetes Have had blood clots Heart disease Immune system conditions Kidney disease Low levels of IgA Recent or upcoming vaccine An unusual or allergic reaction to immune globulin , other medications, foods, dyes, or preservatives Pregnant or trying to get pregnant Breastfeeding How should I use this medication? This medication is infused into a vein or under the skin. It may also be injected into a muscle. It is usually given by your care team in a hospital or clinic setting. It may also be given at home. If you get this medication at home, you will be taught how to prepare and give it. Take it as directed on the prescription label. Keep taking it unless your care team tells you to stop. It is important that you put your used needles and syringes in a special sharps container. Do not put them in a trash can. If you do not have a sharps container, call your pharmacist or care team to get one. Talk to your care team  about the use of this medication in children. While it may be given to children for selected conditions, precautions do apply. Overdosage: If you think you have taken too much of this medicine contact a poison control center or emergency room at once. NOTE: This medicine is only for you. Do not share this medicine with others. What if I miss a dose? If you get this medication at the hospital or clinic: It is important not to miss your dose. Call your care team if you are unable to keep an appointment. If you give yourself this medication at home: If you miss a dose, take it as soon as you can. Then continue your normal schedule. If it is almost time for your next dose, take only that dose. Do not take double or extra doses. Call your care team with questions. What may interact with this medication? Live virus vaccines This list may not describe all possible interactions. Give your health care provider a list of all the medicines, herbs, non-prescription drugs, or dietary supplements you use. Also tell them if you smoke, drink alcohol , or use illegal drugs. Some items may interact with your medicine. What should I watch for while using this medication? Your condition will be monitored carefully while you are receiving this medication. Tell your care team if your symptoms do not start to get better or if they get worse. You may need blood work done while you are taking this medication. This medication increases the risk of blood clots. People with heart, blood vessel, or blood clotting conditions are more likely to develop a blood clot. Other risk factors include advanced age, estrogen use, tobacco  use, lack of movement, and being overweight. This medication can decrease the response to a vaccine. If you need to get vaccinated, tell your care team if you have received this medication within the last year. Extra booster doses may be needed. Talk to your care team to see if a different vaccination schedule  is needed. This medication is made from donated human blood. There is a small risk it may contain bacteria or viruses, such as hepatitis or HIV. All products are processed to kill most bacteria and viruses. Talk to your care team if you have questions about the risk of infection. If you have diabetes, talk to your care team about which device you should use to check your blood sugar. This medication may cause some devices to report falsely high blood sugar levels. This may cause you to react by not treating a low blood sugar level or by giving an insulin  dose that was not needed. This can cause severe low blood sugar levels. What side effects may I notice from receiving this medication? Side effects that you should report to your care team as soon as possible: Allergic reactions--skin rash, itching, hives, swelling of the face, lips, tongue, or throat Blood clot--pain, swelling, or warmth in the leg, shortness of breath, chest pain Fever, neck pain or stiffness, sensitivity to light, headache, nausea, vomiting, confusion, which may be signs of meningitis Hemolytic anemia--unusual weakness or fatigue, dizziness, headache, trouble breathing, dark urine, yellowing skin or eyes Kidney injury--decrease in the amount of urine, swelling of the ankles, hands, or feet Low sodium level--muscle weakness, fatigue, dizziness, headache, confusion Shortness of breath or trouble breathing, cough, unusual weakness or fatigue, blue skin or lips Side effects that usually do not require medical attention (report these to your care team if they continue or are bothersome): Chills Diarrhea Fever Headache Nausea This list may not describe all possible side effects. Call your doctor for medical advice about side effects. You may report side effects to FDA at 1-800-FDA-1088. Where should I keep my medication? Keep out of the reach of children and pets. You will be instructed on how to store this medication. Get rid of  any unused medication after the expiration date. To get rid of medications that are no longer needed or have expired: Take the medication to a medication take-back program. Check with your pharmacy or law enforcement to find a location. If you cannot return the medication, ask your pharmacist or care team how to get rid of this medication safely. NOTE: This sheet is a summary. It may not cover all possible information. If you have questions about this medicine, talk to your doctor, pharmacist, or health care provider.  2025 Elsevier/Gold Standard (2023-09-15 00:00:00)

## 2024-05-14 ENCOUNTER — Inpatient Hospital Stay

## 2024-05-14 ENCOUNTER — Ambulatory Visit: Payer: Self-pay | Admitting: Hematology & Oncology

## 2024-05-14 VITALS — BP 109/54 | HR 78 | Temp 97.5°F | Resp 20

## 2024-05-14 DIAGNOSIS — N1831 Chronic kidney disease, stage 3a: Secondary | ICD-10-CM | POA: Diagnosis not present

## 2024-05-14 DIAGNOSIS — D509 Iron deficiency anemia, unspecified: Secondary | ICD-10-CM

## 2024-05-14 DIAGNOSIS — Z170 Estrogen receptor positive status [ER+]: Secondary | ICD-10-CM | POA: Diagnosis not present

## 2024-05-14 DIAGNOSIS — M3481 Systemic sclerosis with lung involvement: Secondary | ICD-10-CM | POA: Diagnosis not present

## 2024-05-14 DIAGNOSIS — C50912 Malignant neoplasm of unspecified site of left female breast: Secondary | ICD-10-CM | POA: Diagnosis not present

## 2024-05-14 DIAGNOSIS — Z1732 Human epidermal growth factor receptor 2 negative status: Secondary | ICD-10-CM | POA: Diagnosis not present

## 2024-05-14 DIAGNOSIS — D631 Anemia in chronic kidney disease: Secondary | ICD-10-CM | POA: Diagnosis not present

## 2024-05-14 MED ORDER — DEXTROSE 5 % IV SOLN: INTRAVENOUS | Status: DC

## 2024-05-14 MED ORDER — IMMUNE GLOBULIN (HUMAN) 20 GM/200ML IV SOLN
25.0000 g | Freq: Once | INTRAVENOUS | Status: AC
  Administered 2024-05-14: 25 g via INTRAVENOUS
  Filled 2024-05-14: qty 50

## 2024-05-14 NOTE — Progress Notes (Signed)
 Per Dr. Timmy pt aware of the following: the CT scan looks fantastic. We do not see any evidence of breast cancer. Please make sure that her scans are put on a disk so she can take them up to [facility]. Thanks.  Pt thankful for update.

## 2024-05-14 NOTE — Patient Instructions (Signed)
 [facility] - A DEPT OF [facility]  Discharge Instructions: Thank you for choosing [facility] to provide your oncology and hematology care.   If you have a lab appointment with the Cancer Center, please go directly to the Cancer Center and check in at the registration area.  Wear comfortable clothing and clothing appropriate for easy access to any Portacath or PICC line.   We strive to give you quality time with your provider. You may need to reschedule your appointment if you arrive late (15 or more minutes).  Arriving late affects you and other patients whose appointments are after yours.  Also, if you miss three or more appointments without notifying the office, you may be dismissed from the clinic at the provider's discretion.      For prescription refill requests, have your pharmacy contact our office and allow 72 hours for refills to be completed.    Today you received the following agents IVIG  (Privigen )    To help prevent nausea and vomiting after your treatment, we encourage you to take your nausea medication as directed.  BELOW ARE SYMPTOMS THAT SHOULD BE REPORTED IMMEDIATELY: *FEVER GREATER THAN 100.4 F (38 C) OR HIGHER *CHILLS OR SWEATING *NAUSEA AND VOMITING THAT IS NOT CONTROLLED WITH YOUR NAUSEA MEDICATION *UNUSUAL SHORTNESS OF BREATH *UNUSUAL BRUISING OR BLEEDING *URINARY PROBLEMS (pain or burning when urinating, or frequent urination) *BOWEL PROBLEMS (unusual diarrhea, constipation, pain near the anus) TENDERNESS IN MOUTH AND THROAT WITH OR WITHOUT PRESENCE OF ULCERS (sore throat, sores in mouth, or a toothache) UNUSUAL RASH, SWELLING OR PAIN  UNUSUAL VAGINAL DISCHARGE OR ITCHING   Items with * indicate a potential emergency and should be followed up as soon as possible or go to the Emergency Department if any problems should occur.  Please show the CHEMOTHERAPY ALERT CARD or IMMUNOTHERAPY ALERT CARD at check-in to the  Emergency Department and triage nurse. Should you have questions after your visit or need to cancel or reschedule your appointment, please contact [facility] - A DEPT OF [facility]  [phone] and follow the prompts.  Office hours are 8:00 a.m. to 4:30 p.m. Monday - Friday. Please note that voicemails left after 4:00 p.m. may not be returned until the following business day.  We are closed weekends and major holidays. You have access to a nurse at all times for urgent questions. Please call the main number to the clinic [phone] and follow the prompts.  For any non-urgent questions, you may also contact your provider using MyChart. We now offer e-Visits for anyone 75 and older to request care online for non-urgent symptoms. For details visit [email or web address].   Also download the MyChart app! Go to the app store, search MyChart, open the app, select [place], and log in with your MyChart username and password.

## 2024-05-18 DIAGNOSIS — M349 Systemic sclerosis, unspecified: Secondary | ICD-10-CM | POA: Diagnosis not present

## 2024-05-18 DIAGNOSIS — Z79899 Other long term (current) drug therapy: Secondary | ICD-10-CM | POA: Diagnosis not present

## 2024-05-18 DIAGNOSIS — I7300 Raynaud's syndrome without gangrene: Secondary | ICD-10-CM | POA: Diagnosis not present

## 2024-05-18 DIAGNOSIS — J849 Interstitial pulmonary disease, unspecified: Secondary | ICD-10-CM | POA: Diagnosis not present

## 2024-05-19 DIAGNOSIS — I272 Pulmonary hypertension, unspecified: Secondary | ICD-10-CM | POA: Diagnosis not present

## 2024-05-19 DIAGNOSIS — R9389 Abnormal findings on diagnostic imaging of other specified body structures: Secondary | ICD-10-CM | POA: Diagnosis not present

## 2024-05-19 DIAGNOSIS — M349 Systemic sclerosis, unspecified: Secondary | ICD-10-CM | POA: Diagnosis not present

## 2024-05-19 DIAGNOSIS — Z79899 Other long term (current) drug therapy: Secondary | ICD-10-CM | POA: Diagnosis not present

## 2024-05-24 DIAGNOSIS — E876 Hypokalemia: Secondary | ICD-10-CM | POA: Diagnosis not present

## 2024-05-24 DIAGNOSIS — D631 Anemia in chronic kidney disease: Secondary | ICD-10-CM | POA: Diagnosis not present

## 2024-05-24 DIAGNOSIS — N280 Ischemia and infarction of kidney: Secondary | ICD-10-CM | POA: Diagnosis not present

## 2024-05-24 DIAGNOSIS — I129 Hypertensive chronic kidney disease with stage 1 through stage 4 chronic kidney disease, or unspecified chronic kidney disease: Secondary | ICD-10-CM | POA: Diagnosis not present

## 2024-05-24 DIAGNOSIS — N1831 Chronic kidney disease, stage 3a: Secondary | ICD-10-CM | POA: Diagnosis not present

## 2024-05-24 DIAGNOSIS — M349 Systemic sclerosis, unspecified: Secondary | ICD-10-CM | POA: Diagnosis not present

## 2024-05-25 ENCOUNTER — Other Ambulatory Visit: Payer: Self-pay | Admitting: *Deleted

## 2024-05-26 ENCOUNTER — Other Ambulatory Visit: Payer: Self-pay | Admitting: Family Medicine

## 2024-05-29 ENCOUNTER — Encounter: Payer: Self-pay | Admitting: Hematology & Oncology

## 2024-06-01 ENCOUNTER — Inpatient Hospital Stay

## 2024-06-01 ENCOUNTER — Inpatient Hospital Stay: Admitting: Hematology & Oncology

## 2024-06-21 ENCOUNTER — Other Ambulatory Visit: Payer: Self-pay | Admitting: *Deleted

## 2024-06-21 DIAGNOSIS — D509 Iron deficiency anemia, unspecified: Secondary | ICD-10-CM

## 2024-06-21 DIAGNOSIS — M3500 Sicca syndrome, unspecified: Secondary | ICD-10-CM

## 2024-06-22 ENCOUNTER — Telehealth (HOSPITAL_COMMUNITY): Payer: Self-pay | Admitting: Cardiology

## 2024-06-22 MED ORDER — LOSARTAN POTASSIUM 25 MG PO TABS: 25.0000 mg | ORAL_TABLET | Freq: Every day | ORAL | 11 refills | Status: AC

## 2024-06-22 NOTE — Telephone Encounter (Signed)
 Patient called to report losartan  dose is 25 mg BID Reports b/p is running low 90-100 Reports she is SOB on occasion No palps,CP  Reports she is dizzy at times (especially in the AM)  Would like to know if medication can be changed to once a day or decrease dose

## 2024-06-22 NOTE — Telephone Encounter (Signed)
 Agree with decreasing losartan  to 25 mg once daily

## 2024-06-23 ENCOUNTER — Inpatient Hospital Stay

## 2024-06-23 ENCOUNTER — Telehealth: Payer: Self-pay

## 2024-06-23 ENCOUNTER — Inpatient Hospital Stay: Attending: Hematology & Oncology

## 2024-06-23 DIAGNOSIS — Z853 Personal history of malignant neoplasm of breast: Secondary | ICD-10-CM | POA: Diagnosis not present

## 2024-06-23 DIAGNOSIS — M349 Systemic sclerosis, unspecified: Secondary | ICD-10-CM | POA: Insufficient documentation

## 2024-06-23 DIAGNOSIS — M3500 Sicca syndrome, unspecified: Secondary | ICD-10-CM

## 2024-06-23 DIAGNOSIS — D649 Anemia, unspecified: Secondary | ICD-10-CM | POA: Insufficient documentation

## 2024-06-23 DIAGNOSIS — D509 Iron deficiency anemia, unspecified: Secondary | ICD-10-CM

## 2024-06-23 LAB — CBC WITH DIFFERENTIAL (CANCER CENTER ONLY)
Abs Immature Granulocytes: 0.02 K/uL (ref 0.00–0.07)
Basophils Absolute: 0 K/uL (ref 0.0–0.1)
Basophils Relative: 1 %
Eosinophils Absolute: 0.2 K/uL (ref 0.0–0.5)
Eosinophils Relative: 3 %
HCT: 24.6 % — ABNORMAL LOW (ref 36.0–46.0)
Hemoglobin: 7.9 g/dL — ABNORMAL LOW (ref 12.0–15.0)
Immature Granulocytes: 0 %
Lymphocytes Relative: 9 %
Lymphs Abs: 0.6 K/uL — ABNORMAL LOW (ref 0.7–4.0)
MCH: 29.9 pg (ref 26.0–34.0)
MCHC: 32.1 g/dL (ref 30.0–36.0)
MCV: 93.2 fL (ref 80.0–100.0)
Monocytes Absolute: 0.5 K/uL (ref 0.1–1.0)
Monocytes Relative: 8 %
Neutro Abs: 5.6 K/uL (ref 1.7–7.7)
Neutrophils Relative %: 79 %
Platelet Count: 271 K/uL (ref 150–400)
RBC: 2.64 MIL/uL — ABNORMAL LOW (ref 3.87–5.11)
RDW: 14.5 % (ref 11.5–15.5)
WBC Count: 7 K/uL (ref 4.0–10.5)
nRBC: 0 % (ref 0.0–0.2)

## 2024-06-23 LAB — COMPREHENSIVE METABOLIC PANEL WITH GFR
ALT: 10 U/L (ref 0–44)
AST: 21 U/L (ref 15–41)
Albumin: 4.3 g/dL (ref 3.5–5.0)
Alkaline Phosphatase: 64 U/L (ref 38–126)
Anion gap: 13 (ref 5–15)
BUN: 14 mg/dL (ref 8–23)
CO2: 19 mmol/L — ABNORMAL LOW (ref 22–32)
Calcium: 8.9 mg/dL (ref 8.9–10.3)
Chloride: 102 mmol/L (ref 98–111)
Creatinine, Ser: 1.15 mg/dL — ABNORMAL HIGH (ref 0.44–1.00)
GFR, Estimated: 49 mL/min — ABNORMAL LOW (ref >60)
Glucose, Bld: 112 mg/dL — ABNORMAL HIGH (ref 70–99)
Potassium: 3.4 mmol/L — ABNORMAL LOW (ref 3.5–5.1)
Sodium: 134 mmol/L — ABNORMAL LOW (ref 135–145)
Total Bilirubin: 0.3 mg/dL (ref 0.0–1.2)
Total Protein: 6.9 g/dL (ref 6.5–8.1)

## 2024-06-23 LAB — IRON AND IRON BINDING CAPACITY (CC-WL,HP ONLY)
Iron: 115 ug/dL (ref 28–170)
Saturation Ratios: 39 % — ABNORMAL HIGH (ref 10.4–31.8)
TIBC: 298 ug/dL (ref 250–450)
UIBC: 183 ug/dL

## 2024-06-23 LAB — FERRITIN: Ferritin: 190 ng/mL (ref 11–307)

## 2024-06-23 NOTE — Patient Instructions (Signed)

## 2024-06-23 NOTE — Telephone Encounter (Signed)
 Pt was in clinic for a port flush and stopped by the desk to inquire about another IVIG. Pt states she's been on Privigen  for awhile and had horrible side effects post infusion. Pt states she is lethargic, has hypotension, cough, diarrhea and more. Per Norleen in pharmacy we can try Gamunex - [place] confirmed this with authorized. Dr Timmy agreed to change. Ok to keep with same schedule and get her back here on 08/09/24 for this infusion.

## 2024-06-24 ENCOUNTER — Encounter: Payer: Self-pay | Admitting: Hematology & Oncology

## 2024-06-28 ENCOUNTER — Encounter: Payer: Self-pay | Admitting: Hematology & Oncology

## 2024-06-30 ENCOUNTER — Other Ambulatory Visit: Payer: Self-pay | Admitting: Family Medicine

## 2024-07-12 ENCOUNTER — Telehealth: Payer: Self-pay | Admitting: Internal Medicine

## 2024-07-12 NOTE — Telephone Encounter (Signed)
 Patient called stating she is having significant diarrhea. Patient is scheduled for 2/3, advised as of now there are no sooner appointments. Patient would like to discuss if there are any recommendations to help in the meantime. Please advise, thank you.

## 2024-07-13 NOTE — Telephone Encounter (Signed)
"  Left message for pt to call back   "

## 2024-07-13 NOTE — Telephone Encounter (Signed)
 Pt reports she has been having diarrhea and Imodium is not longer working. Pt scheduled to see Dr. Albertus 07/28/24 at 9:30am. Pt aware of appt.

## 2024-07-28 ENCOUNTER — Ambulatory Visit (INDEPENDENT_AMBULATORY_CARE_PROVIDER_SITE_OTHER): Admitting: Internal Medicine

## 2024-07-28 ENCOUNTER — Encounter: Payer: Self-pay | Admitting: Internal Medicine

## 2024-07-28 ENCOUNTER — Other Ambulatory Visit

## 2024-07-28 VITALS — BP 118/64 | HR 94 | Ht 64.0 in | Wt 127.0 lb

## 2024-07-28 DIAGNOSIS — Z860101 Personal history of adenomatous and serrated colon polyps: Secondary | ICD-10-CM | POA: Diagnosis not present

## 2024-07-28 DIAGNOSIS — K602 Anal fissure, unspecified: Secondary | ICD-10-CM

## 2024-07-28 DIAGNOSIS — K2100 Gastro-esophageal reflux disease with esophagitis, without bleeding: Secondary | ICD-10-CM

## 2024-07-28 DIAGNOSIS — R1319 Other dysphagia: Secondary | ICD-10-CM

## 2024-07-28 DIAGNOSIS — K224 Dyskinesia of esophagus: Secondary | ICD-10-CM | POA: Diagnosis not present

## 2024-07-28 DIAGNOSIS — K648 Other hemorrhoids: Secondary | ICD-10-CM

## 2024-07-28 DIAGNOSIS — K529 Noninfective gastroenteritis and colitis, unspecified: Secondary | ICD-10-CM | POA: Diagnosis not present

## 2024-07-28 DIAGNOSIS — R1310 Dysphagia, unspecified: Secondary | ICD-10-CM

## 2024-07-28 MED ORDER — AMBULATORY NON FORMULARY MEDICATION: 1 refills | Status: AC

## 2024-07-28 NOTE — Progress Notes (Signed)
 "  Subjective:    Patient ID: [name], female    DOB: 1951-06-27, 74 y.o.   MRN: [id number]  HPI [name] is a 74 year old female with systemic sclerosis, GERD, and esophageal dysmotility who presents for follow-up of chronic diarrhea and UGI symptoms.  She is here today with her husband..  Complex history of systemic sclerosis with esophageal dysmotility, GERD, and prior erosive esophagitis. Last EGD on 08/18/23 showed [place] grade C esophagitis, benign stricture at 37 cm dilated to 18 mm, and a 2 cm hiatal hernia. History of internal hemorrhoids and adenomatous colonic polyps; most recent colonoscopy on 01/29/18 showed only internal hemorrhoids. CT chest, abdomen, and pelvis on 05/12/24 demonstrated stable interstitial lung disease; GI and bowel portions were normal. Laboratory studies on 04/25/24 revealed normocytic anemia (hemoglobin 7.9) with normal iron studies.  Chronic diarrhea ongoing for approximately four months, worsening soon after travel to [place] in October 2025. Previously associated with IVIG therapy, which was discontinued twelve weeks ago due to side effects; symptoms worsened after stopping IVIG. Currently experiencing three to four morning bowel movements, now with formed stool, previously runny. Diarrhea improved significantly after switching from [employer] protein supplement to [employer] product with fewer artificial sweeteners, used for almost two weeks. Not currently using antidiarrheal medications; Imodium AD was previously helpful, Lomotil  was ineffective.  Rectal pain and intermittent bleeding with blood noted on wiping after bowel movements, especially when stool is more formed. Pain described as burning or stinging during passage of stool, resolving quickly after. Soreness in the rectal area with bowel movements, no persistent itching. History of internal hemorrhoids.  Seven pound weight loss since June 14, 2024, with partial regain of two pounds. Current  weight is 120 lbs, previously stable at 125 lbs. Improvement in eating and weight stabilization attributed to new protein supplement. Lost four pounds during travel in [place] due to poor food intake.  Anemia persists, with hemoglobin 7.9 on 04/25/24 and normal iron studies. Weekly shots not started as previously discussed with Dr. Timmy, as no regimen was provided. Concern about possible gastrointestinal sources of blood loss, including 'watermelon stomach,' but prior endoscopies have not shown this finding.  Continued dysphagia and esophageal symptoms, particularly during periods of inflammation and while off vonoprazan. Difficulty swallowing certain pills, such as Mucinex capsules, but can tolerate caplet formulations. Previous choking on food. Currently taking vonoprazan 10 mg daily; trialed discontinuation for one week without improvement in diarrhea, but noted increased phlegm when off the medication, suggesting benefit for reflux symptoms. Uncertain if current dose is adequate.   Severe sinus symptoms this year, with difficulty swallowing Mucinex capsules for sinus relief. Occasional use of peppermint mints to control cough.  Expresses fatigue, but not daily. Does not consume alcohol , diet sodas, or candies with artificial sweeteners, except for occasional peppermint mints for cough. Blood pressure medication discontinued after experiencing low readings (<100 mmHg), with current values in the 113-117 mmHg range.   Review of Systems As per HPI, otherwise negative  Current Medications, Allergies, Past Medical History, Past Surgical History, Family History and Social History were reviewed in [employer] record.    Objective:   Physical Exam BP 118/64   Pulse 94   Ht 5' 4 (1.626 m)   Wt 127 lb (57.6 kg)   BMI 21.80 kg/m  Gen: awake, alert, NAD HEENT: anicteric  Neuro: nonfocal  Labs Iron studies (04/25/2024): Iron 115 within normal limits, TIBC 298 within  normal limits, iron saturation 39,  ferritin 190 CBC (04/25/2024): Hemoglobin 7.9, MCV 93.2, platelet count 271, white count 7.0 Liver panel (04/25/2024): AST 21, ALT 10, total bile 0.3, alk phos 64 Renal function (04/25/2024): Creatinine 1.15, BUN 14 Immunoglobulins (05/10/2024): IgA within normal limits, IgG within normal limits, IgM within normal limits Free light chains (05/10/2024): Kappa free light chains within normal limits, lambda free light chains within normal limits CK (05/10/2024): Within normal limits at 62 CRP (05/10/2024): <0.5  Radiology CT chest, abdomen, and pelvis without contrast (05/12/2024): No evidence of recurrent or residual disease in chest, abdomen, or pelvis; stable interstitial lung disease with basilar predominant reticulation, traction bronchiectasis, ground glass opacity; 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; no honeycombing; normal gastrointestinal and bowel segments; pancreas, liver, and gallbladder unremarkable  Diagnostic EGD (09/14/2023): [place] grade C esophagitis, benign appearing esophageal stricture at 37 cm from incisors dilated to 18 mm, 2 cm hiatal hernia, normal stomach and duodenum, no evidence of gastric antral vascular ectasia Colonoscopy (01/29/2018): Normal except for internal hemorrhoids  Pathology Gastric biopsy: Benign      Assessment & Plan:   Gastroesophageal reflux disease with esophagitis and dysphagia Chronic erosive esophagitis with intermittent dysphagia, requiring further evaluation and potential therapy escalation. - Ordered upper endoscopy (EGD) with possible dilation to assess esophagitis healing, evaluate for stricture, and determine need for vonoprazan dose adjustment. 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 - Will evaluate esophageal motility and stricture during upcoming EGD.  Chronic diarrhea Chronic diarrhea, partially improved, likely multifactorial with need to rule out inflammatory or pancreatic causes. - Ordered fecal calprotectin to assess for intestinal inflammation. - Ordered ova and parasite stool test to rule out infectious causes. - Ordered fecal elastase to evaluate for pancreatic insufficiency. - Discontinued Lomotil  due to lack of efficacy. - Monitor response to dietary modifications and stool test results. - Can use loperamide as needed per box instruction  Anal fissure Acute anal fissure with pain and minor bleeding, likely due to stool changes. - Prescribed topical diltiazem to present gel, to be applied two to three times daily for two to four weeks, with instructions to continue for one additional week after symptom resolution.  Internal hemorrhoids Asymptomatic internal hemorrhoids without significant bleeding or complications. - Monitor for changes in bleeding pattern or severity.  History of remote adenomatous polyp in 2016 Last colonoscopy in 2019 normal -Colonoscopy interval would be 5 to 10 years from 2019, deferred for now based on patient preference  68-month follow-up with me  45 minutes total spent today including patient facing time, coordination of care, reviewing medical history/procedures/pertinent radiology studies, and documentation of the encounter.  "

## 2024-07-28 NOTE — Patient Instructions (Signed)
 Your provider has requested that you go to the basement level for lab work before leaving today. Press B on the elevator. The lab is located at the first door on the left as you exit the elevator.  We have sent a prescription for Diltiazem 2% gel to [facility] for you. Using your index finger, you should apply a small amount of medication inside the rectum up to your first knuckle/joint 2-3 times daily x 4 weeks.  [facility]'s information is below: Address: [address], [place], [place] [address]  [phone]  *Please DO NOT go directly from our office to pick up this medication! Give the pharmacy 1 day to process the prescription as this is compounded and takes time to make.  You have been scheduled for an endoscopy. Please follow written instructions given to you at your visit today.  If you use inhalers (even only as needed), please bring them with you on the day of your procedure.  If you take any of the following medications, they will need to be adjusted prior to your procedure:   DO NOT TAKE 7 DAYS PRIOR TO TEST- Trulicity (dulaglutide) Ozempic, Wegovy (semaglutide) Mounjaro, Zepbound (tirzepatide) Bydureon Bcise (exanatide extended release)  DO NOT TAKE 1 DAY PRIOR TO YOUR TEST Rybelsus (semaglutide) Adlyxin (lixisenatide) Victoza (liraglutide) Byetta (exanatide) ___________________________________________________________________________

## 2024-07-30 ENCOUNTER — Other Ambulatory Visit

## 2024-07-30 DIAGNOSIS — K529 Noninfective gastroenteritis and colitis, unspecified: Secondary | ICD-10-CM

## 2024-08-02 NOTE — Telephone Encounter (Addendum)
 Patient called stating that she is continuing to have issues with her losartan . Patient states that since she decreased it to once daily d/t lower bp- reports that now her blood pressure dropped again and states that she stopped the losartan  due to this, and states that now her bp has been steady in the 1 teens and reports that she feels much better since she stopped the losartan . Patient states that she feels great, but wants to let provider know she has stopped her losartan .   Advised patient to monitor blood pressure and to call with any issues. She verbalized understanding.

## 2024-08-03 LAB — OVA AND PARASITE EXAMINATION
CONCENTRATE RESULT:: NONE SEEN
MICRO NUMBER:: 17479063
SPECIMEN QUALITY:: ADEQUATE
TRICHROME RESULT:: NONE SEEN

## 2024-08-04 ENCOUNTER — Ambulatory Visit: Payer: Self-pay | Admitting: Internal Medicine

## 2024-08-04 ENCOUNTER — Encounter: Payer: Self-pay | Admitting: Hematology & Oncology

## 2024-08-04 LAB — CALPROTECTIN, FECAL: Calprotectin, Fecal: 121 ug/g — ABNORMAL HIGH (ref 0–120)

## 2024-08-05 LAB — PANCREATIC ELASTASE, FECAL: Pancreatic Elastase-1, Stool: >800 ug/g

## 2024-08-09 ENCOUNTER — Inpatient Hospital Stay

## 2024-08-09 ENCOUNTER — Inpatient Hospital Stay: Admitting: Hematology & Oncology

## 2024-08-10 ENCOUNTER — Inpatient Hospital Stay: Admitting: Medical Oncology

## 2024-08-10 ENCOUNTER — Inpatient Hospital Stay

## 2024-08-10 ENCOUNTER — Inpatient Hospital Stay: Admitting: Family

## 2024-08-10 ENCOUNTER — Inpatient Hospital Stay: Attending: Hematology & Oncology

## 2024-08-10 VITALS — BP 122/60 | HR 76 | Temp 97.6°F | Resp 18

## 2024-08-10 VITALS — BP 144/82 | HR 99 | Temp 97.5°F | Resp 18 | Ht 64.0 in | Wt 127.0 lb

## 2024-08-10 DIAGNOSIS — Z170 Estrogen receptor positive status [ER+]: Secondary | ICD-10-CM

## 2024-08-10 DIAGNOSIS — Z95828 Presence of other vascular implants and grafts: Secondary | ICD-10-CM | POA: Diagnosis not present

## 2024-08-10 DIAGNOSIS — C50611 Malignant neoplasm of axillary tail of right female breast: Secondary | ICD-10-CM

## 2024-08-10 DIAGNOSIS — D631 Anemia in chronic kidney disease: Secondary | ICD-10-CM

## 2024-08-10 DIAGNOSIS — N1831 Chronic kidney disease, stage 3a: Secondary | ICD-10-CM | POA: Insufficient documentation

## 2024-08-10 DIAGNOSIS — C50412 Malignant neoplasm of upper-outer quadrant of left female breast: Secondary | ICD-10-CM

## 2024-08-10 DIAGNOSIS — M349 Systemic sclerosis, unspecified: Secondary | ICD-10-CM

## 2024-08-10 DIAGNOSIS — Z923 Personal history of irradiation: Secondary | ICD-10-CM | POA: Insufficient documentation

## 2024-08-10 DIAGNOSIS — Z79890 Hormone replacement therapy (postmenopausal): Secondary | ICD-10-CM | POA: Insufficient documentation

## 2024-08-10 DIAGNOSIS — D539 Nutritional anemia, unspecified: Secondary | ICD-10-CM | POA: Insufficient documentation

## 2024-08-10 DIAGNOSIS — R978 Other abnormal tumor markers: Secondary | ICD-10-CM | POA: Insufficient documentation

## 2024-08-10 DIAGNOSIS — D509 Iron deficiency anemia, unspecified: Secondary | ICD-10-CM

## 2024-08-10 DIAGNOSIS — Z79899 Other long term (current) drug therapy: Secondary | ICD-10-CM | POA: Diagnosis not present

## 2024-08-10 DIAGNOSIS — K529 Noninfective gastroenteritis and colitis, unspecified: Secondary | ICD-10-CM | POA: Insufficient documentation

## 2024-08-10 DIAGNOSIS — R1310 Dysphagia, unspecified: Secondary | ICD-10-CM | POA: Insufficient documentation

## 2024-08-10 DIAGNOSIS — Z79624 Long term (current) use of inhibitors of nucleotide synthesis: Secondary | ICD-10-CM | POA: Insufficient documentation

## 2024-08-10 DIAGNOSIS — Z853 Personal history of malignant neoplasm of breast: Secondary | ICD-10-CM | POA: Insufficient documentation

## 2024-08-10 LAB — IRON AND IRON BINDING CAPACITY (CC-WL,HP ONLY)
Iron: 115 ug/dL (ref 28–170)
Saturation Ratios: 40 % — ABNORMAL HIGH (ref 10.4–31.8)
TIBC: 290 ug/dL (ref 250–450)
UIBC: 175 ug/dL

## 2024-08-10 LAB — CMP (CANCER CENTER ONLY)
ALT: 7 U/L (ref 0–44)
AST: 19 U/L (ref 15–41)
Albumin: 4.1 g/dL (ref 3.5–5.0)
Alkaline Phosphatase: 55 U/L (ref 38–126)
Anion gap: 10 (ref 5–15)
BUN: 17 mg/dL (ref 8–23)
CO2: 21 mmol/L — ABNORMAL LOW (ref 22–32)
Calcium: 9.2 mg/dL (ref 8.9–10.3)
Chloride: 105 mmol/L (ref 98–111)
Creatinine: 1.02 mg/dL — ABNORMAL HIGH (ref 0.44–1.00)
GFR, Estimated: 57 mL/min — ABNORMAL LOW
Glucose, Bld: 109 mg/dL — ABNORMAL HIGH (ref 70–99)
Potassium: 3.5 mmol/L (ref 3.5–5.1)
Sodium: 137 mmol/L (ref 135–145)
Total Bilirubin: 0.4 mg/dL (ref 0.0–1.2)
Total Protein: 6.3 g/dL — ABNORMAL LOW (ref 6.5–8.1)

## 2024-08-10 LAB — CBC WITH DIFFERENTIAL (CANCER CENTER ONLY)
Abs Immature Granulocytes: 0.07 10*3/uL (ref 0.00–0.07)
Basophils Absolute: 0 10*3/uL (ref 0.0–0.1)
Basophils Relative: 1 %
Eosinophils Absolute: 0.4 10*3/uL (ref 0.0–0.5)
Eosinophils Relative: 4 %
HCT: 26.9 % — ABNORMAL LOW (ref 36.0–46.0)
Hemoglobin: 8.2 g/dL — ABNORMAL LOW (ref 12.0–15.0)
Immature Granulocytes: 1 %
Lymphocytes Relative: 6 %
Lymphs Abs: 0.6 10*3/uL — ABNORMAL LOW (ref 0.7–4.0)
MCH: 29.1 pg (ref 26.0–34.0)
MCHC: 30.5 g/dL (ref 30.0–36.0)
MCV: 95.4 fL (ref 80.0–100.0)
Monocytes Absolute: 0.7 10*3/uL (ref 0.1–1.0)
Monocytes Relative: 8 %
Neutro Abs: 7.2 10*3/uL (ref 1.7–7.7)
Neutrophils Relative %: 80 %
Platelet Count: 311 10*3/uL (ref 150–400)
RBC: 2.82 MIL/uL — ABNORMAL LOW (ref 3.87–5.11)
RDW: 14.6 % (ref 11.5–15.5)
WBC Count: 8.9 10*3/uL (ref 4.0–10.5)
nRBC: 0 % (ref 0.0–0.2)

## 2024-08-10 LAB — RETICULOCYTES
Immature Retic Fract: 12.8 % (ref 2.3–15.9)
RBC.: 2.72 MIL/uL — ABNORMAL LOW (ref 3.87–5.11)
Retic Count, Absolute: 77 10*3/uL (ref 19.0–186.0)
Retic Ct Pct: 2.8 % (ref 0.4–3.1)

## 2024-08-10 LAB — FERRITIN: Ferritin: 144 ng/mL (ref 11–307)

## 2024-08-10 MED ORDER — DIPHENHYDRAMINE HCL 25 MG PO CAPS: 25.0000 mg | ORAL_CAPSULE | Freq: Once | ORAL | Status: DC

## 2024-08-10 MED ORDER — ACETAMINOPHEN 325 MG PO TABS: 650.0000 mg | ORAL_TABLET | Freq: Once | ORAL | Status: DC

## 2024-08-10 MED ORDER — IMMUNE GLOBULIN (HUMAN) 10 GM/100ML IJ SOLN
25.0000 g | Freq: Once | INTRAMUSCULAR | Status: AC
  Administered 2024-08-10: 25 g via INTRAVENOUS
  Filled 2024-08-10: qty 200

## 2024-08-10 MED ORDER — DEXTROSE 5 % IV SOLN: INTRAVENOUS | Status: DC

## 2024-08-10 NOTE — Patient Instructions (Signed)

## 2024-08-10 NOTE — Progress Notes (Signed)
 " Hematology and Oncology Follow Up Visit  [name] [id number] 02/05/1951 74 y.o. 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   Of note, she is followed by Madie Schmidt and [facility]. She goes to [facility] every every 6 months and [facility] every 6 months in an alternating fashion. Apparently, that her rheumatologist up there voiced concerns regarding her CA 27.29 being chronically elevated. A CT scan was then performed which showed no evidence of recurrent or residual disease in the chest, abdomen or pelvis.   She is somewhat anemic. 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 She does not seem to have any issues with broccoli. She has not responded as well to the Aranesp  so instead we are using Procrit /Retacrit and a short acting agent to try to get her hemoglobin up. Recently she had her dentist adjust her crowns to help with food mastication and this has helped.   She has had no bleeding.  Has been no change in bowel or bladder habits.  She has seen GI for her chronic diarrhea. Weight is now stable from being down during the holiday. She has been having trouble swallowing especially vegetables. She is able to tolerate fruits. Encapsulated pills she takes with applesauce. She has an endoscopy scheduled for the week after next. She mentions that she thinks that a scleroderma flare is likely a cause for some of her symptoms.   The IVIG really has helped her symptoms with respect to her  scleroderma.  Overall, I would have to say that her performance status is probably ECOG 1.    Wt Readings from Last 3 Encounters:  08/10/24 127 lb (57.6 kg)  07/28/24 127 lb (57.6 kg)  05/10/24 126 lb 12.8 oz (57.5 kg)   Medications:  Current Outpatient Medications:    acetaminophen  (TYLENOL ) 650 MG CR tablet, Take 650 mg by mouth daily as needed for pain., Disp: , Rfl:    AMBULATORY NON FORMULARY MEDICATION, Medication Name: Diltiazem 2% gel- Using your index finger, you should apply a small amount of medication inside the rectum up to your first knuckle/joint 2-3 times daily x 4 weeks., Disp: 45 g, Rfl: 1   benzonatate  (TESSALON ) 100 MG capsule, Take 1 capsule (100 mg total) by mouth 3 (three) times daily as needed for cough., Disp: 30 capsule, Rfl: 3   diphenhydrAMINE  (BENADRYL ) 25 mg capsule, Take 25 mg by mouth every 6 (six) hours as needed for allergies. Allergies and before IVIG treatment., Disp: , Rfl:    diphenoxylate -atropine  (LOMOTIL ) 2.5-0.025 MG tablet, Take 1 tablet by mouth 3 (three) times daily as needed for diarrhea or loose stools., Disp: 60 tablet, Rfl: 1   ELDERBERRY PO, Take 1 oz by mouth 4 (four) times daily as needed. Liquid, Disp: , Rfl:    furosemide  (LASIX ) 20 MG tablet, Take 1 tablet (20 mg total) by mouth daily., Disp: 90 tablet, Rfl: 1   loperamide (IMODIUM A-D) 2 MG tablet, Take 2 mg by mouth 4 (four) times daily as needed for diarrhea or loose stools., Disp: ,  Rfl:    losartan  (COZAAR ) 25 MG tablet, Take 1 tablet (25 mg total) by mouth daily. (Patient taking differently: Take 25 mg by mouth daily. On hold), Disp: 30 tablet, Rfl: 11   Multiple Vitamin (MULTIVITAMIN) capsule, Take 1 capsule by mouth daily., Disp: , Rfl:    mupirocin ointment (BACTROBAN) 2 %, Apply within both nasal passages twice daily for twice daily., Disp: , Rfl:    mycophenolate (CELLCEPT) 500 MG tablet, Take 1,500 mg by mouth 2 (two) times daily., Disp: , Rfl:    sodium chloride  (OCEAN) 0.65 %  SOLN nasal spray, Place 1 spray into both nostrils as needed for congestion., Disp: , Rfl:    SYNTHROID  100 MCG tablet, TAKE 1 TABLET DAILY BEFORE BREAKFAST, Disp: 90 tablet, Rfl: 3   Vonoprazan Fumarate  (VOQUEZNA ) 10 MG TABS, Take 1 tablet by mouth daily., Disp: 30 tablet, Rfl: 5 No current facility-administered medications for this visit.  Facility-Administered Medications Ordered in Other Visits:    acetaminophen  (TYLENOL ) tablet 650 mg, 650 mg, Oral, Once, Ennever, Maude SAUNDERS, MD   dextrose  5 % solution, , Intravenous, Continuous, Ennever, Maude SAUNDERS, MD, Last Rate: 10 mL/hr at 08/10/24 1216, New Bag at 08/10/24 1216   diphenhydrAMINE  (BENADRYL ) capsule 25 mg, 25 mg, Oral, Once, Ennever, Maude SAUNDERS, MD  Allergies:  Allergies  Allergen Reactions   Betadine [Povidone Iodine] Rash   Epinephrine Other (See Comments)    Severe headaches    Iodinated Contrast Media Other (See Comments) and Rash    per pt: recently had CT 08/ 2018 even through given pre-medication, IVP still caused rash  per pt recently had CT 08/ 2018 even through given pre-medication, IVP still caused rash   Iodine Rash and Other (See Comments)   Oxycodone  Anxiety   Prednisone  Anaphylaxis    Per Nephrology pt should avoid this medication because she has Scleroderma and it will interfere with her Kidneys,    Gabapentin Other (See Comments)    Dizziness   Lidocaine  Rash   Nickel Rash and Other (See Comments)    Rash and blisters   Other Other (See Comments) and Rash    Hospital gown  Product containing penicillin (product)  Substance with sulfonamide structure and antibacterial mechanism of action (substance)   Penicillins Rash and Other (See Comments)    Rash and blisters Has patient had a PCN reaction causing immediate rash, facial/tongue/throat swelling, SOB or lightheadedness with hypotension: Yes Has patient had a PCN reaction causing severe rash involving mucus membranes or skin necrosis: No Has patient had a  PCN reaction that required hospitalization: No Has patient had a PCN reaction occurring within the last 10 years: No If all of the above answers are NO, then may proceed with Cephalosporin use.    Sulfa Antibiotics Rash    Past Medical History, Surgical history, Social history, and Family History were reviewed and updated.  Review of Systems: Review of Systems  Constitutional:  Positive for fatigue.  HENT:  Negative.    Eyes: Negative.   Respiratory: Negative.    Cardiovascular: Negative.   Gastrointestinal: Negative.   Endocrine: Negative.   Genitourinary: Negative.    Musculoskeletal:  Positive for arthralgias and myalgias.  Skin:  Positive for rash.  Neurological: Negative.   Hematological: Negative.   Psychiatric/Behavioral: Negative.      Physical Exam: Vitals:   08/10/24 1100  BP: (!) 144/82  Pulse: 99  Resp: 18  Temp: (!) 97.5 F (36.4 C)    Wt Readings from Last  3 Encounters:  08/10/24 127 lb (57.6 kg)  07/28/24 127 lb (57.6 kg)  05/10/24 126 lb 12.8 oz (57.5 kg)    Physical Exam Vitals reviewed.  Constitutional:      Comments: Breast examination deferred today by patient   HENT:     Head: Normocephalic and atraumatic.  Eyes:     Pupils: Pupils are equal, round, and reactive to light.  Cardiovascular:     Rate and Rhythm: Normal rate and regular rhythm.     Heart sounds: Normal heart sounds.  Pulmonary:     Effort: Pulmonary effort is normal.     Breath sounds: Normal breath sounds.  Abdominal:     General: Bowel sounds are normal.     Palpations: Abdomen is soft.  Musculoskeletal:        General: No tenderness or deformity. Normal range of motion.     Cervical back: Normal range of motion.  Lymphadenopathy:     Cervical: No cervical adenopathy.  Skin:    General: Skin is warm and dry.     Findings: No erythema or rash.     Comments: Skin exam shows some changes of the scleroderma.  This is mostly on the face, around the lip area.  Her arms  do show a little bit of skin thickening.  Neurological:     Mental Status: She is alert and oriented to person, place, and time.  Psychiatric:        Behavior: Behavior normal.        Thought Content: Thought content normal.        Judgment: Judgment normal.      Lab Results  Component Value Date   WBC 8.9 08/10/2024   HGB 8.2 (L) 08/10/2024   HCT 26.9 (L) 08/10/2024   MCV 95.4 08/10/2024   PLT 311 08/10/2024     Chemistry      Component Value Date/Time   NA 137 08/10/2024 0940   NA 144 01/27/2020 1148   NA 141 06/09/2017 1117   K 3.5 08/10/2024 0940   K 3.9 06/09/2017 1117   CL 105 08/10/2024 0940   CL 109 (H) 12/14/2012 1253   CO2 21 (L) 08/10/2024 0940   CO2 23 06/09/2017 1117   BUN 17 08/10/2024 0940   BUN 11 01/27/2020 1148   BUN 11.5 06/09/2017 1117   CREATININE 1.02 (H) 08/10/2024 0940   CREATININE 1.2 (H) 06/09/2017 1117      Component Value Date/Time   CALCIUM  9.2 08/10/2024 0940   CALCIUM  8.9 06/09/2017 1117   ALKPHOS 55 08/10/2024 0940   ALKPHOS 40 06/09/2017 1117   AST 19 08/10/2024 0940   AST 35 (H) 06/09/2017 1117   ALT 7 08/10/2024 0940   ALT 19 06/09/2017 1117   BILITOT 0.4 08/10/2024 0940   BILITOT 0.37 06/09/2017 1117     Encounter Diagnoses  Name Primary?   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 Mihalic is a very charming 74 year old white female.  She has had a history of early stage breast cancer.   In terms of her history of breast cancer she gets a yearly MR. Her last MR was in May 2025 and showed no concerns.  She has no concerns of changes.   Her main problem is scleroderma.  We are giving her IVIG for this.  Her [employer] and [facility] rheumatologists  want to have her get IVIG every 3 months now. Due to some excessive fatigue following Privigen  infusions the recommendation was made by her specialists to switch to try Gamunex instead. She is scheduled to get this today. We discussed this medication as well as with our clinical pharmacy team. We will proceed forward and see how she tolerates this. She will return once daily x 5 days and then will return in 3 months for another infusion.  In terms of her anemia she will continue follow up with GI and we will trial Procrit  instead of Aranesp . We discussed this as well. Goal will be for her to come in once weekly for these injections as her schedule allows. Given that she is trailing a new IVIG medications we have elected to wait to administer the Procrit  until later this or next week so that we know which medication to suspect if she has a reaction.   Today her anemia is a bit improved with a Hgb of 8.2 CMP is overall stable Start process for Procrit [employer] approval   IVIG today and daily x 5 Procrit  injection next week then once weekly x 3 months RTC 3 months MD, port labs, IVIG   I spent 45 minutes dedicated to the care of this patient (face to face and non-face to face) on the date of this encounter to include the information stated above  Lauraine CHRISTELLA Dais, PA-C 1/27/20261:12 PM "

## 2024-08-11 ENCOUNTER — Inpatient Hospital Stay

## 2024-08-11 VITALS — BP 123/54 | HR 85 | Temp 97.5°F | Resp 18

## 2024-08-11 DIAGNOSIS — D509 Iron deficiency anemia, unspecified: Secondary | ICD-10-CM

## 2024-08-11 LAB — CANCER ANTIGEN 27.29: CA 27.29: 50 U/mL — ABNORMAL HIGH (ref 0.0–38.6)

## 2024-08-11 MED ORDER — DEXTROSE 5 % IV SOLN: INTRAVENOUS | Status: DC

## 2024-08-11 MED ORDER — SODIUM CHLORIDE 0.9% FLUSH: 10.0000 mL | Freq: Once | INTRAVENOUS | Status: DC | PRN

## 2024-08-11 MED ORDER — IMMUNE GLOBULIN (HUMAN) 10 GM/100ML IJ SOLN
25.0000 g | Freq: Once | INTRAMUSCULAR | Status: AC
  Administered 2024-08-11: 25 g via INTRAVENOUS
  Filled 2024-08-11: qty 200

## 2024-08-11 NOTE — Patient Instructions (Signed)
 Immune Globulin  Injection What is this medication? IMMUNE GLOBULIN  (im MUNE GLOB yoo lin) treats many immune system conditions. It works by Designer, multimedia extra antibodies. Antibodies are proteins made by the immune system that help protect the body. This medicine may be used for other purposes; ask your health care provider or pharmacist if you have questions. COMMON BRAND NAME(S): ASCENIV, Baygam, BIVIGAM, Carimune, Carimune NF, cutaquig, Cuvitru, Flebogamma, Flebogamma DIF, GamaSTAN, GamaSTAN S/D, Gamimune N, Gammagard, Gammagard S/D, Gammaked, Gammaplex, Gammar-P IV, Gamunex, Gamunex-C, Hizentra, Iveegam, Iveegam EN, Octagam, Panglobulin, Panglobulin NF, panzyga, Polygam S/D, Privigen , Sandoglobulin, Venoglobulin-S, Vigam, Vivaglobulin, Xembify What should I tell my care team before I take this medication? They need to know if you have any of these conditions: Blood clotting disorder Condition where you have excess fluid in your body, such as heart failure or edema Dehydration Diabetes Have had blood clots Heart disease Immune system conditions Kidney disease Low levels of IgA Recent or upcoming vaccine An unusual or allergic reaction to immune globulin , other medications, foods, dyes, or preservatives Pregnant or trying to get pregnant Breastfeeding How should I use this medication? This medication is infused into a vein or under the skin. It may also be injected into a muscle. It is usually given by your care team in a hospital or clinic setting. It may also be given at home. If you get this medication at home, you will be taught how to prepare and give it. Take it as directed on the prescription label. Keep taking it unless your care team tells you to stop. It is important that you put your used needles and syringes in a special sharps container. Do not put them in a trash can. If you do not have a sharps container, call your pharmacist or care team to get one. Talk to your care team  about the use of this medication in children. While it may be given to children for selected conditions, precautions do apply. Overdosage: If you think you have taken too much of this medicine contact a poison control center or emergency room at once. NOTE: This medicine is only for you. Do not share this medicine with others. What if I miss a dose? If you get this medication at the hospital or clinic: It is important not to miss your dose. Call your care team if you are unable to keep an appointment. If you give yourself this medication at home: If you miss a dose, take it as soon as you can. Then continue your normal schedule. If it is almost time for your next dose, take only that dose. Do not take double or extra doses. Call your care team with questions. What may interact with this medication? Live virus vaccines This list may not describe all possible interactions. Give your health care provider a list of all the medicines, herbs, non-prescription drugs, or dietary supplements you use. Also tell them if you smoke, drink alcohol , or use illegal drugs. Some items may interact with your medicine. What should I watch for while using this medication? Your condition will be monitored carefully while you are receiving this medication. Tell your care team if your symptoms do not start to get better or if they get worse. You may need blood work done while you are taking this medication. This medication increases the risk of blood clots. People with heart, blood vessel, or blood clotting conditions are more likely to develop a blood clot. Other risk factors include advanced age, estrogen use, tobacco  use, lack of movement, and being overweight. This medication can decrease the response to a vaccine. If you need to get vaccinated, tell your care team if you have received this medication within the last year. Extra booster doses may be needed. Talk to your care team to see if a different vaccination schedule  is needed. This medication is made from donated human blood. There is a small risk it may contain bacteria or viruses, such as hepatitis or HIV. All products are processed to kill most bacteria and viruses. Talk to your care team if you have questions about the risk of infection. If you have diabetes, talk to your care team about which device you should use to check your blood sugar. This medication may cause some devices to report falsely high blood sugar levels. This may cause you to react by not treating a low blood sugar level or by giving an insulin  dose that was not needed. This can cause severe low blood sugar levels. What side effects may I notice from receiving this medication? Side effects that you should report to your care team as soon as possible: Allergic reactions--skin rash, itching, hives, swelling of the face, lips, tongue, or throat Blood clot--pain, swelling, or warmth in the leg, shortness of breath, chest pain Fever, neck pain or stiffness, sensitivity to light, headache, nausea, vomiting, confusion, which may be signs of meningitis Hemolytic anemia--unusual weakness or fatigue, dizziness, headache, trouble breathing, dark urine, yellowing skin or eyes Kidney injury--decrease in the amount of urine, swelling of the ankles, hands, or feet Low sodium level--muscle weakness, fatigue, dizziness, headache, confusion Shortness of breath or trouble breathing, cough, unusual weakness or fatigue, blue skin or lips Side effects that usually do not require medical attention (report these to your care team if they continue or are bothersome): Chills Diarrhea Fever Headache Nausea This list may not describe all possible side effects. Call your doctor for medical advice about side effects. You may report side effects to FDA at 1-800-FDA-1088. Where should I keep my medication? Keep out of the reach of children and pets. You will be instructed on how to store this medication. Get rid of  any unused medication after the expiration date. To get rid of medications that are no longer needed or have expired: Take the medication to a medication take-back program. Check with your pharmacy or law enforcement to find a location. If you cannot return the medication, ask your pharmacist or care team how to get rid of this medication safely. NOTE: This sheet is a summary. It may not cover all possible information. If you have questions about this medicine, talk to your doctor, pharmacist, or health care provider.  2025 Elsevier/Gold Standard (2023-09-15 00:00:00)

## 2024-08-12 ENCOUNTER — Inpatient Hospital Stay

## 2024-08-12 VITALS — BP 125/55 | HR 85 | Temp 97.6°F | Resp 18

## 2024-08-12 DIAGNOSIS — D509 Iron deficiency anemia, unspecified: Secondary | ICD-10-CM

## 2024-08-12 MED ORDER — IMMUNE GLOBULIN (HUMAN) 10 GM/100ML IJ SOLN
25.0000 g | Freq: Once | INTRAMUSCULAR | Status: AC
  Administered 2024-08-12: 25 g via INTRAVENOUS
  Filled 2024-08-12: qty 250

## 2024-08-12 MED ORDER — DIPHENHYDRAMINE HCL 25 MG PO CAPS: 25.0000 mg | ORAL_CAPSULE | Freq: Once | ORAL | Status: DC

## 2024-08-12 MED ORDER — ACETAMINOPHEN 325 MG PO TABS: 650.0000 mg | ORAL_TABLET | Freq: Once | ORAL | Status: DC

## 2024-08-12 NOTE — Patient Instructions (Signed)
 Immune Globulin  Injection What is this medication? IMMUNE GLOBULIN  (im MUNE GLOB yoo lin) treats many immune system conditions. It works by Designer, multimedia extra antibodies. Antibodies are proteins made by the immune system that help protect the body. This medicine may be used for other purposes; ask your health care provider or pharmacist if you have questions. COMMON BRAND NAME(S): ASCENIV, Baygam, BIVIGAM, Carimune, Carimune NF, cutaquig, Cuvitru, Flebogamma, Flebogamma DIF, GamaSTAN, GamaSTAN S/D, Gamimune N, Gammagard, Gammagard S/D, Gammaked, Gammaplex, Gammar-P IV, Gamunex, Gamunex-C, Hizentra, Iveegam, Iveegam EN, Octagam, Panglobulin, Panglobulin NF, panzyga, Polygam S/D, Privigen , Sandoglobulin, Venoglobulin-S, Vigam, Vivaglobulin, Xembify What should I tell my care team before I take this medication? They need to know if you have any of these conditions: Blood clotting disorder Condition where you have excess fluid in your body, such as heart failure or edema Dehydration Diabetes Have had blood clots Heart disease Immune system conditions Kidney disease Low levels of IgA Recent or upcoming vaccine An unusual or allergic reaction to immune globulin , other medications, foods, dyes, or preservatives Pregnant or trying to get pregnant Breastfeeding How should I use this medication? This medication is infused into a vein or under the skin. It may also be injected into a muscle. It is usually given by your care team in a hospital or clinic setting. It may also be given at home. If you get this medication at home, you will be taught how to prepare and give it. Take it as directed on the prescription label. Keep taking it unless your care team tells you to stop. It is important that you put your used needles and syringes in a special sharps container. Do not put them in a trash can. If you do not have a sharps container, call your pharmacist or care team to get one. Talk to your care team  about the use of this medication in children. While it may be given to children for selected conditions, precautions do apply. Overdosage: If you think you have taken too much of this medicine contact a poison control center or emergency room at once. NOTE: This medicine is only for you. Do not share this medicine with others. What if I miss a dose? If you get this medication at the hospital or clinic: It is important not to miss your dose. Call your care team if you are unable to keep an appointment. If you give yourself this medication at home: If you miss a dose, take it as soon as you can. Then continue your normal schedule. If it is almost time for your next dose, take only that dose. Do not take double or extra doses. Call your care team with questions. What may interact with this medication? Live virus vaccines This list may not describe all possible interactions. Give your health care provider a list of all the medicines, herbs, non-prescription drugs, or dietary supplements you use. Also tell them if you smoke, drink alcohol , or use illegal drugs. Some items may interact with your medicine. What should I watch for while using this medication? Your condition will be monitored carefully while you are receiving this medication. Tell your care team if your symptoms do not start to get better or if they get worse. You may need blood work done while you are taking this medication. This medication increases the risk of blood clots. People with heart, blood vessel, or blood clotting conditions are more likely to develop a blood clot. Other risk factors include advanced age, estrogen use, tobacco  use, lack of movement, and being overweight. This medication can decrease the response to a vaccine. If you need to get vaccinated, tell your care team if you have received this medication within the last year. Extra booster doses may be needed. Talk to your care team to see if a different vaccination schedule  is needed. This medication is made from donated human blood. There is a small risk it may contain bacteria or viruses, such as hepatitis or HIV. All products are processed to kill most bacteria and viruses. Talk to your care team if you have questions about the risk of infection. If you have diabetes, talk to your care team about which device you should use to check your blood sugar. This medication may cause some devices to report falsely high blood sugar levels. This may cause you to react by not treating a low blood sugar level or by giving an insulin  dose that was not needed. This can cause severe low blood sugar levels. What side effects may I notice from receiving this medication? Side effects that you should report to your care team as soon as possible: Allergic reactions--skin rash, itching, hives, swelling of the face, lips, tongue, or throat Blood clot--pain, swelling, or warmth in the leg, shortness of breath, chest pain Fever, neck pain or stiffness, sensitivity to light, headache, nausea, vomiting, confusion, which may be signs of meningitis Hemolytic anemia--unusual weakness or fatigue, dizziness, headache, trouble breathing, dark urine, yellowing skin or eyes Kidney injury--decrease in the amount of urine, swelling of the ankles, hands, or feet Low sodium level--muscle weakness, fatigue, dizziness, headache, confusion Shortness of breath or trouble breathing, cough, unusual weakness or fatigue, blue skin or lips Side effects that usually do not require medical attention (report these to your care team if they continue or are bothersome): Chills Diarrhea Fever Headache Nausea This list may not describe all possible side effects. Call your doctor for medical advice about side effects. You may report side effects to FDA at 1-800-FDA-1088. Where should I keep my medication? Keep out of the reach of children and pets. You will be instructed on how to store this medication. Get rid of  any unused medication after the expiration date. To get rid of medications that are no longer needed or have expired: Take the medication to a medication take-back program. Check with your pharmacy or law enforcement to find a location. If you cannot return the medication, ask your pharmacist or care team how to get rid of this medication safely. NOTE: This sheet is a summary. It may not cover all possible information. If you have questions about this medicine, talk to your doctor, pharmacist, or health care provider.  2025 Elsevier/Gold Standard (2023-09-15 00:00:00)

## 2024-08-13 ENCOUNTER — Inpatient Hospital Stay

## 2024-08-13 VITALS — BP 123/57 | HR 79 | Temp 98.2°F | Resp 19

## 2024-08-13 DIAGNOSIS — D509 Iron deficiency anemia, unspecified: Secondary | ICD-10-CM

## 2024-08-13 MED ORDER — DEXTROSE 5 % IV SOLN: INTRAVENOUS | Status: DC

## 2024-08-13 MED ORDER — IMMUNE GLOBULIN (HUMAN) 10 GM/100ML IJ SOLN
25.0000 g | Freq: Once | INTRAMUSCULAR | Status: AC
  Administered 2024-08-13: 25 g via INTRAVENOUS
  Filled 2024-08-13: qty 200

## 2024-08-13 NOTE — Patient Instructions (Signed)
 Immune Globulin  Injection What is this medication? IMMUNE GLOBULIN  (im MUNE GLOB yoo lin) treats many immune system conditions. It works by Designer, multimedia extra antibodies. Antibodies are proteins made by the immune system that help protect the body. This medicine may be used for other purposes; ask your health care provider or pharmacist if you have questions. COMMON BRAND NAME(S): ASCENIV, Baygam, BIVIGAM, Carimune, Carimune NF, cutaquig, Cuvitru, Flebogamma, Flebogamma DIF, GamaSTAN, GamaSTAN S/D, Gamimune N, Gammagard, Gammagard S/D, Gammaked, Gammaplex, Gammar-P IV, Gamunex, Gamunex-C, Hizentra, Iveegam, Iveegam EN, Octagam, Panglobulin, Panglobulin NF, panzyga, Polygam S/D, Privigen , Sandoglobulin, Venoglobulin-S, Vigam, Vivaglobulin, Xembify What should I tell my care team before I take this medication? They need to know if you have any of these conditions: Blood clotting disorder Condition where you have excess fluid in your body, such as heart failure or edema Dehydration Diabetes Have had blood clots Heart disease Immune system conditions Kidney disease Low levels of IgA Recent or upcoming vaccine An unusual or allergic reaction to immune globulin , other medications, foods, dyes, or preservatives Pregnant or trying to get pregnant Breastfeeding How should I use this medication? This medication is infused into a vein or under the skin. It may also be injected into a muscle. It is usually given by your care team in a hospital or clinic setting. It may also be given at home. If you get this medication at home, you will be taught how to prepare and give it. Take it as directed on the prescription label. Keep taking it unless your care team tells you to stop. It is important that you put your used needles and syringes in a special sharps container. Do not put them in a trash can. If you do not have a sharps container, call your pharmacist or care team to get one. Talk to your care team  about the use of this medication in children. While it may be given to children for selected conditions, precautions do apply. Overdosage: If you think you have taken too much of this medicine contact a poison control center or emergency room at once. NOTE: This medicine is only for you. Do not share this medicine with others. What if I miss a dose? If you get this medication at the hospital or clinic: It is important not to miss your dose. Call your care team if you are unable to keep an appointment. If you give yourself this medication at home: If you miss a dose, take it as soon as you can. Then continue your normal schedule. If it is almost time for your next dose, take only that dose. Do not take double or extra doses. Call your care team with questions. What may interact with this medication? Live virus vaccines This list may not describe all possible interactions. Give your health care provider a list of all the medicines, herbs, non-prescription drugs, or dietary supplements you use. Also tell them if you smoke, drink alcohol , or use illegal drugs. Some items may interact with your medicine. What should I watch for while using this medication? Your condition will be monitored carefully while you are receiving this medication. Tell your care team if your symptoms do not start to get better or if they get worse. You may need blood work done while you are taking this medication. This medication increases the risk of blood clots. People with heart, blood vessel, or blood clotting conditions are more likely to develop a blood clot. Other risk factors include advanced age, estrogen use, tobacco  use, lack of movement, and being overweight. This medication can decrease the response to a vaccine. If you need to get vaccinated, tell your care team if you have received this medication within the last year. Extra booster doses may be needed. Talk to your care team to see if a different vaccination schedule  is needed. This medication is made from donated human blood. There is a small risk it may contain bacteria or viruses, such as hepatitis or HIV. All products are processed to kill most bacteria and viruses. Talk to your care team if you have questions about the risk of infection. If you have diabetes, talk to your care team about which device you should use to check your blood sugar. This medication may cause some devices to report falsely high blood sugar levels. This may cause you to react by not treating a low blood sugar level or by giving an insulin  dose that was not needed. This can cause severe low blood sugar levels. What side effects may I notice from receiving this medication? Side effects that you should report to your care team as soon as possible: Allergic reactions--skin rash, itching, hives, swelling of the face, lips, tongue, or throat Blood clot--pain, swelling, or warmth in the leg, shortness of breath, chest pain Fever, neck pain or stiffness, sensitivity to light, headache, nausea, vomiting, confusion, which may be signs of meningitis Hemolytic anemia--unusual weakness or fatigue, dizziness, headache, trouble breathing, dark urine, yellowing skin or eyes Kidney injury--decrease in the amount of urine, swelling of the ankles, hands, or feet Low sodium level--muscle weakness, fatigue, dizziness, headache, confusion Shortness of breath or trouble breathing, cough, unusual weakness or fatigue, blue skin or lips Side effects that usually do not require medical attention (report these to your care team if they continue or are bothersome): Chills Diarrhea Fever Headache Nausea This list may not describe all possible side effects. Call your doctor for medical advice about side effects. You may report side effects to FDA at 1-800-FDA-1088. Where should I keep my medication? Keep out of the reach of children and pets. You will be instructed on how to store this medication. Get rid of  any unused medication after the expiration date. To get rid of medications that are no longer needed or have expired: Take the medication to a medication take-back program. Check with your pharmacy or law enforcement to find a location. If you cannot return the medication, ask your pharmacist or care team how to get rid of this medication safely. NOTE: This sheet is a summary. It may not cover all possible information. If you have questions about this medicine, talk to your doctor, pharmacist, or health care provider.  2025 Elsevier/Gold Standard (2023-09-15 00:00:00)

## 2024-08-16 ENCOUNTER — Inpatient Hospital Stay

## 2024-08-17 ENCOUNTER — Inpatient Hospital Stay: Attending: Hematology & Oncology

## 2024-08-17 ENCOUNTER — Inpatient Hospital Stay

## 2024-08-17 ENCOUNTER — Ambulatory Visit: Admitting: Internal Medicine

## 2024-08-17 VITALS — BP 135/64 | HR 83 | Temp 97.5°F | Resp 17

## 2024-08-17 DIAGNOSIS — M349 Systemic sclerosis, unspecified: Secondary | ICD-10-CM

## 2024-08-17 DIAGNOSIS — D509 Iron deficiency anemia, unspecified: Secondary | ICD-10-CM

## 2024-08-17 MED ORDER — IMMUNE GLOBULIN (HUMAN) 10 GM/100ML IJ SOLN
25.0000 g | Freq: Once | INTRAMUSCULAR | Status: AC
  Administered 2024-08-17: 25 g via INTRAVENOUS
  Filled 2024-08-17: qty 250

## 2024-08-17 MED ORDER — ACETAMINOPHEN 325 MG PO TABS: 650.0000 mg | ORAL_TABLET | Freq: Once | ORAL | Status: DC

## 2024-08-17 MED ORDER — DEXTROSE 5 % IV SOLN: INTRAVENOUS | Status: DC

## 2024-08-17 MED ORDER — DIPHENHYDRAMINE HCL 25 MG PO CAPS: 25.0000 mg | ORAL_CAPSULE | Freq: Once | ORAL | Status: DC

## 2024-08-17 NOTE — Patient Instructions (Signed)
 Immune Globulin  Injection What is this medication? IMMUNE GLOBULIN  (im MUNE GLOB yoo lin) treats many immune system conditions. It works by Designer, multimedia extra antibodies. Antibodies are proteins made by the immune system that help protect the body. This medicine may be used for other purposes; ask your health care provider or pharmacist if you have questions. COMMON BRAND NAME(S): ASCENIV, Baygam, BIVIGAM, Carimune, Carimune NF, cutaquig, Cuvitru, Flebogamma, Flebogamma DIF, GamaSTAN, GamaSTAN S/D, Gamimune N, Gammagard, Gammagard S/D, Gammaked, Gammaplex, Gammar-P IV, Gamunex, Gamunex-C, Hizentra, Iveegam, Iveegam EN, Octagam, Panglobulin, Panglobulin NF, panzyga, Polygam S/D, Privigen , Sandoglobulin, Venoglobulin-S, Vigam, Vivaglobulin, Xembify What should I tell my care team before I take this medication? They need to know if you have any of these conditions: Blood clotting disorder Condition where you have excess fluid in your body, such as heart failure or edema Dehydration Diabetes Have had blood clots Heart disease Immune system conditions Kidney disease Low levels of IgA Recent or upcoming vaccine An unusual or allergic reaction to immune globulin , other medications, foods, dyes, or preservatives Pregnant or trying to get pregnant Breastfeeding How should I use this medication? This medication is infused into a vein or under the skin. It may also be injected into a muscle. It is usually given by your care team in a hospital or clinic setting. It may also be given at home. If you get this medication at home, you will be taught how to prepare and give it. Take it as directed on the prescription label. Keep taking it unless your care team tells you to stop. It is important that you put your used needles and syringes in a special sharps container. Do not put them in a trash can. If you do not have a sharps container, call your pharmacist or care team to get one. Talk to your care team  about the use of this medication in children. While it may be given to children for selected conditions, precautions do apply. Overdosage: If you think you have taken too much of this medicine contact a poison control center or emergency room at once. NOTE: This medicine is only for you. Do not share this medicine with others. What if I miss a dose? If you get this medication at the hospital or clinic: It is important not to miss your dose. Call your care team if you are unable to keep an appointment. If you give yourself this medication at home: If you miss a dose, take it as soon as you can. Then continue your normal schedule. If it is almost time for your next dose, take only that dose. Do not take double or extra doses. Call your care team with questions. What may interact with this medication? Live virus vaccines This list may not describe all possible interactions. Give your health care provider a list of all the medicines, herbs, non-prescription drugs, or dietary supplements you use. Also tell them if you smoke, drink alcohol , or use illegal drugs. Some items may interact with your medicine. What should I watch for while using this medication? Your condition will be monitored carefully while you are receiving this medication. Tell your care team if your symptoms do not start to get better or if they get worse. You may need blood work done while you are taking this medication. This medication increases the risk of blood clots. People with heart, blood vessel, or blood clotting conditions are more likely to develop a blood clot. Other risk factors include advanced age, estrogen use, tobacco  use, lack of movement, and being overweight. This medication can decrease the response to a vaccine. If you need to get vaccinated, tell your care team if you have received this medication within the last year. Extra booster doses may be needed. Talk to your care team to see if a different vaccination schedule  is needed. This medication is made from donated human blood. There is a small risk it may contain bacteria or viruses, such as hepatitis or HIV. All products are processed to kill most bacteria and viruses. Talk to your care team if you have questions about the risk of infection. If you have diabetes, talk to your care team about which device you should use to check your blood sugar. This medication may cause some devices to report falsely high blood sugar levels. This may cause you to react by not treating a low blood sugar level or by giving an insulin  dose that was not needed. This can cause severe low blood sugar levels. What side effects may I notice from receiving this medication? Side effects that you should report to your care team as soon as possible: Allergic reactions--skin rash, itching, hives, swelling of the face, lips, tongue, or throat Blood clot--pain, swelling, or warmth in the leg, shortness of breath, chest pain Fever, neck pain or stiffness, sensitivity to light, headache, nausea, vomiting, confusion, which may be signs of meningitis Hemolytic anemia--unusual weakness or fatigue, dizziness, headache, trouble breathing, dark urine, yellowing skin or eyes Kidney injury--decrease in the amount of urine, swelling of the ankles, hands, or feet Low sodium level--muscle weakness, fatigue, dizziness, headache, confusion Shortness of breath or trouble breathing, cough, unusual weakness or fatigue, blue skin or lips Side effects that usually do not require medical attention (report these to your care team if they continue or are bothersome): Chills Diarrhea Fever Headache Nausea This list may not describe all possible side effects. Call your doctor for medical advice about side effects. You may report side effects to FDA at 1-800-FDA-1088. Where should I keep my medication? Keep out of the reach of children and pets. You will be instructed on how to store this medication. Get rid of  any unused medication after the expiration date. To get rid of medications that are no longer needed or have expired: Take the medication to a medication take-back program. Check with your pharmacy or law enforcement to find a location. If you cannot return the medication, ask your pharmacist or care team how to get rid of this medication safely. NOTE: This sheet is a summary. It may not cover all possible information. If you have questions about this medicine, talk to your doctor, pharmacist, or health care provider.  2025 Elsevier/Gold Standard (2023-09-15 00:00:00)

## 2024-08-23 ENCOUNTER — Encounter: Admitting: Internal Medicine

## 2024-08-25 ENCOUNTER — Inpatient Hospital Stay

## 2024-09-01 ENCOUNTER — Inpatient Hospital Stay

## 2024-09-08 ENCOUNTER — Inpatient Hospital Stay

## 2024-09-13 ENCOUNTER — Ambulatory Visit: Admitting: Internal Medicine

## 2024-09-13 ENCOUNTER — Encounter

## 2024-09-15 ENCOUNTER — Inpatient Hospital Stay

## 2024-09-22 ENCOUNTER — Inpatient Hospital Stay

## 2024-09-29 ENCOUNTER — Inpatient Hospital Stay

## 2024-10-06 ENCOUNTER — Inpatient Hospital Stay

## 2024-10-13 ENCOUNTER — Inpatient Hospital Stay

## 2024-10-20 ENCOUNTER — Inpatient Hospital Stay

## 2024-10-27 ENCOUNTER — Inpatient Hospital Stay

## 2024-11-03 ENCOUNTER — Inpatient Hospital Stay

## 2024-11-08 ENCOUNTER — Inpatient Hospital Stay

## 2024-11-08 ENCOUNTER — Inpatient Hospital Stay: Admitting: Hematology & Oncology

## 2024-11-09 ENCOUNTER — Inpatient Hospital Stay

## 2024-11-10 ENCOUNTER — Inpatient Hospital Stay

## 2024-11-11 ENCOUNTER — Inpatient Hospital Stay

## 2024-11-12 ENCOUNTER — Inpatient Hospital Stay

## 2024-11-15 ENCOUNTER — Other Ambulatory Visit (HOSPITAL_COMMUNITY)

## 2024-11-15 ENCOUNTER — Ambulatory Visit (HOSPITAL_COMMUNITY): Admitting: Internal Medicine

## 2024-12-02 ENCOUNTER — Ambulatory Visit
